# Patient Record
Sex: Male | Born: 1940 | Race: Asian
Health system: Southern US, Academic
[De-identification: ages and names within clinical notes are randomized; demographics above are authoritative.]

## PROBLEM LIST (undated history)

## (undated) ENCOUNTER — Encounter

## (undated) DIAGNOSIS — Z978 Presence of other specified devices: Secondary | ICD-10-CM

## (undated) DIAGNOSIS — I2510 Atherosclerotic heart disease of native coronary artery without angina pectoris: Secondary | ICD-10-CM

## (undated) DIAGNOSIS — K219 Gastro-esophageal reflux disease without esophagitis: Secondary | ICD-10-CM

## (undated) DIAGNOSIS — N319 Neuromuscular dysfunction of bladder, unspecified: Secondary | ICD-10-CM

## (undated) DIAGNOSIS — I495 Sick sinus syndrome: Secondary | ICD-10-CM

## (undated) DIAGNOSIS — N400 Enlarged prostate without lower urinary tract symptoms: Secondary | ICD-10-CM

## (undated) DIAGNOSIS — I10 Essential (primary) hypertension: Secondary | ICD-10-CM

## (undated) DIAGNOSIS — I639 Cerebral infarction, unspecified: Secondary | ICD-10-CM

## (undated) DIAGNOSIS — E559 Vitamin D deficiency, unspecified: Secondary | ICD-10-CM

## (undated) HISTORY — DX: Vitamin D deficiency, unspecified: E55.9

## (undated) HISTORY — PX: CARDIAC SURGERY: SHX584

## (undated) HISTORY — PX: PACEMAKER PLACEMENT: SHX43

## (undated) HISTORY — PX: APPENDECTOMY: SHX54

---

## 2017-06-24 ENCOUNTER — Inpatient Hospital Stay

## 2017-06-24 ENCOUNTER — Emergency Department: Admit: 2017-06-24 | Discharge: 2017-06-25

## 2017-06-24 DIAGNOSIS — Z590 Homelessness: Secondary | ICD-10-CM

## 2017-06-24 DIAGNOSIS — I10 Essential (primary) hypertension: Secondary | ICD-10-CM

## 2017-06-24 DIAGNOSIS — I2510 Atherosclerotic heart disease of native coronary artery without angina pectoris: Secondary | ICD-10-CM

## 2017-06-24 DIAGNOSIS — Z955 Presence of coronary angioplasty implant and graft: Secondary | ICD-10-CM

## 2017-06-24 DIAGNOSIS — I69323 Fluency disorder following cerebral infarction: Secondary | ICD-10-CM

## 2017-06-24 DIAGNOSIS — Z95810 Presence of automatic (implantable) cardiac defibrillator: Secondary | ICD-10-CM

## 2017-06-24 DIAGNOSIS — Z9181 History of falling: Secondary | ICD-10-CM

## 2017-06-24 DIAGNOSIS — R0602 Shortness of breath: Secondary | ICD-10-CM

## 2017-06-24 DIAGNOSIS — F17210 Nicotine dependence, cigarettes, uncomplicated: Secondary | ICD-10-CM

## 2017-06-24 DIAGNOSIS — I69354 Hemiplegia and hemiparesis following cerebral infarction affecting left non-dominant side: Secondary | ICD-10-CM

## 2017-06-24 DIAGNOSIS — Z550 Illiteracy and low-level literacy: Secondary | ICD-10-CM

## 2017-06-24 DIAGNOSIS — Z951 Presence of aortocoronary bypass graft: Secondary | ICD-10-CM

## 2017-06-24 DIAGNOSIS — R079 Chest pain, unspecified: Secondary | ICD-10-CM

## 2017-06-24 DIAGNOSIS — R296 Repeated falls: Secondary | ICD-10-CM

## 2017-06-24 DIAGNOSIS — I639 Cerebral infarction, unspecified: Secondary | ICD-10-CM

## 2017-06-24 MED ORDER — IOHEXOL 350 MG/ML IV SOLN SH
100 mL | Freq: Once | INTRAVENOUS | Status: CP
Start: 2017-06-24 — End: ?

## 2017-06-24 MED ORDER — ASPIRIN 325 MG PO TABS
325 mg | Freq: Once | ORAL | Status: CP
Start: 2017-06-24 — End: ?

## 2017-06-24 MED ORDER — SODIUM CHLORIDE 0.9% FOR FLUSHES
20-180 mL | INTRAVENOUS | Status: CP | PRN
Start: 2017-06-24 — End: ?

## 2017-06-24 MED ORDER — GLUCOSE 4 G PO CHEW JX
16 g | ORAL | Status: DC | PRN
Start: 2017-06-24 — End: 2017-06-26

## 2017-06-24 MED ORDER — ENOXAPARIN SODIUM 40 MG/0.4ML SC SOLN
40 mg | Freq: Every day | SUBCUTANEOUS | Status: DC
Start: 2017-06-24 — End: 2017-06-26

## 2017-06-24 MED ORDER — DEXTROSE 50 % IV SOLN
30 mL | INTRAVENOUS | Status: DC | PRN
Start: 2017-06-24 — End: 2017-06-26

## 2017-06-24 MED ORDER — ASPIRIN 81 MG PO CHEW
81 mg | Freq: Every day | ORAL | Status: DC
Start: 2017-06-24 — End: 2017-06-26

## 2017-06-24 NOTE — ED Notes
Pt stated someone told him that he was going to be d/c and no longer admitted. Paged IM MD about whether or not pt is being d/c or admitted. Tracking #[phone]

## 2017-06-24 NOTE — Medical Student
CC: Chest pain and SOB    HPI: Mr. Antonio Hunter is a 77 y.o. 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  Episode was brief (a few minutes) and improved with rest; no cited aggravating factors.  Patient states this is the first episode of chest pain since placement of his pacemaker.   Episode was associated with shortness of breath but denies radiation of pain to back or arm, palpitations, N/V, sweating, dizziness, lightheadedness, LE swelling, HA, changes in vision, or focal weakness.  Denies recent illness, fever, fatigue.  Denies GI or urinary sx.  Patient reports compliance with 3 medications (unable to name), including dialy aspirin, "cholesterol pill", and "heart pill", "blood pressure pill".  Patient's social situation is significant for homeslessness.  He states he is in frequent contact with his niece [name] (phone # provided verbally by patient: [phone]) who lives in [place].    PMH: ICM (EF 35%), CAD, CVA, HTN.    PSH: Back surgery (unspecified procedure involving 4 vertebrae) in 1971.  Appendectomy in 1987.  CABG (unspecified date). AICD placement 06/17/17.    FHx: Unknown    Social Hx: Current smoker 1/2 pack per day.  No EtOH use.  Denies drug use.  Homeless and unemployed.  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    ROS:  Constitutional: Negative for fever, fatigue, chills.  Eyes: Negative changes in vision.  Respiratory: Negative for wheezing.  Positive for SOB.  Cardiovascular: Negative for orthopnea, claudication, LE edema.  GI: Negative for nausea, vomiting, change in BM, abdominal pain.  GU: Baseline incontinence requiring diaper; good control over bowels.  Neurologic: L-sided UE weakness (baseline deficits from previous stroke).

## 2017-06-24 NOTE — ED Notes
Pt AOX4, RR e/u, NAD. Pt comfortable, bed in lowest position, side rails up X2. Will continue to monitor.

## 2017-06-24 NOTE — ED Provider Notes
Chest pain, unspecified type  SOB (shortness of breath)  CAD in native artery      ED Patient Status   Patient Status:   [phone]}        ED Medical Evaluation Initiated   Medical Evaluation Initiated:  Yes, filed at 06/24/17 0409  by Ella JubileeGabriel, James, MD

## 2017-06-24 NOTE — ED Provider Notes
Temp src Oral   Height 1.854 m   Weight 72.6 kg   SpO2 100 %   BMI (Calculated) 21.15             Physical Exam   Constitutional: He appears well-developed.   HENT:   Head: Normocephalic.   Nose: No rhinorrhea. No epistaxis.   Eyes: Pupils are equal, round, and reactive to light. No scleral icterus.   Neck: No tracheal deviation present.   Cardiovascular: Regular rhythm and normal heart sounds.    No murmur heard.  Pulmonary/Chest: Effort normal. No stridor. He has no rales.   Abdominal: He exhibits no distension and no mass. There is no guarding.   Musculoskeletal: Normal range of motion.   Skin: Skin is warm.   Nursing note and vitals reviewed.      Differential DDx:   ACS/STEMI, Pneumothorax, Pulmonary Embolism, Aortic Dissection, Cocaine CP, Costochondritis. Also,  GERD, Esophagitis, PUD, Gastric Ulcer,  Pancreatitis, Other      Is this an Emergent Medical Condition? Yes - Severe Pain/Acute Onset of Symptons  409.901 FS  641.19 FS  627.732 (16) FS    ED Workup   Procedures    Labs:  - - No data to display      Imaging (Read by ED Provider):  ***      EKG (Read by ED Provider):  ***        ED Course & Re-Evaluation     ED Course as of Jun 24 533   Fri Jun 24, 2017   0441 Tried to provide patient with papers to read/write for comfort 2/2 to difficulty speak. In low tone of voice patient states "I cant read or write"  [JG]   0507 Sent of for records from Antonio Hunter Hunter  [JG]      ED Course User Index  [JG] Antonio Hunter Hunter, James, MD     Obtained Records: Antonio Hunter Hunter Antonio Hunter 29-Sep-1940. HPI noted 06/15/17: "Patient was brought here because he has no where else to go" "He was evicted from an assisted living facility which he was staying at for his left sided deficits from CVA in the 90s. He wqas [facility] and taken to the shelter however was not given placement due to his unsteady gait"     MDM   Decide to obtain history from someone other than the patient: Yes - EMS    Decide to obtain previous medical records: No

## 2017-06-24 NOTE — ED Provider Notes
Per Radiology: Cta Chest Pe Protocol W/o & W/ Iv Con    Result Date: 06/24/2017  CT ANGIOGRAPHY OF THE CHEST (PULMONARY EMBOLUS PROTOCOL) Clinical History: [age] years Male SOB (shortness of breath) Comparison: None Technique: Spiral, multi-slice images were obtained and reconstructed at 2mm increments from the thoracic inlet to the adrenal glands following the administration of intravenous contrast only. Multiplanar and 3-D MIP image reconstructions were performed. FINDINGS: Pulmonary Arteries: There are no filling defects within the pulmonary arterial system to suggest pulmonary embolus. Aorta and branching vessels: Scattered ASVD of the aorta and great vessels is noted without any  flow-limiting stenoses. No aneurysm or dissection of the aortic arch or thoracic aorta is seen. There is scattered atherosclerotic vascular disease throughout the aorta and celiac artery, SMA and bilateral renal arteries. Thyroid: Small nonspecific 3 mm hypodensity within the anterior aspect of the right lower lobe,  otherwise no significant abnormalities. Mediastinum/hilum: The hila are normal in appearance bilaterally.  No mediastinal masses or adenopathy is seen.  The visualized tracheobronchial tree is normal.  The visualized esophagus is normal. Heart: The heart is normal is size.  There is no pericardial effusion seen.  There is diffuse atherosclerotic vascular disease throughout the coronary arteries. Coronary stents are also noted. The RV/LV ratio is normal (less than 0.9). Lungs: The lung fields are clear without consolidations, volume loss, or mass lesions.  There are no pleural effusions, pneumothorax, or hemothorax seen. Upper Abdomen: Limited images of the visualized upper abdomen demonstrate no acute abnormality. Osseous structures: Multilevel bridging vertebral osteophytes. Mild kyphosis of the thoracic spine. No acute osseous abnormality. Median sternotomy wires noted. Soft tissues: There is a left

## 2017-06-24 NOTE — ED Notes
Pt resting w/ eyes closed. Pt RR e/u, NAD.

## 2017-06-24 NOTE — Consults
unremarkable. EKG abnormal with diffuse TWI, no old EKG to compare. Trop I negative. CXR unremarkable. Bedside echo with EF 40%, RV normal size/function, no pericardial effusion.  Patient with non anginal pain at the site of the recently implanted AICD. No signs of infection/hematoma on exam.     Recommendations:  -device RN to check device today  -no further CV workup     Robb MatarKhadeeja Esmail, M.D   Cardiology Fellow PGY 5  Pager [phone]

## 2017-06-24 NOTE — ED Provider Notes
60 ml/min/1.73M2.   HEPATIC FUNCTION PANEL - Abnormal     Albumin 3.9  3.8 - 4.9 g/dL    Total Bilirubin 0.7  0.2 - 1.0 mg/dL    Bilirubin, Direct 0.2  0.0 - 0.2 mg/dL    Bilirubin, Indirect 0.5  8mg /dL mg/dL    Alkaline Phosphatase 176 (*) 40 - 129 IU/L    AST 20  14 - 33 IU/L    ALT 22  10 - 42 IU/L    Total Protein 7.7  6.5 - 8.3 g/dL    ALBUMIN/GLOBULIN RATIO 1.0  (calc)    Calc Total Globuin 3.8  gm/dL   D-DIMER,QUANTITATIVE - Abnormal     D Dimer (hs) 1.98 (*) 0.00 - 0.49 ug/mL (FEU)    Comment: A cut-off for the exclusion of DVT and PE has not been established for this method   CBC AUTODIFF - Abnormal     WBC 5.54  4.5 - 11 x10E3/uL    RBC 4.30 (*) 4.50 - 6.30 x10E6/uL    Hemoglobin 12.0 (*) 14.0 - 18.0 g/dL    Hematocrit 46.937.8 (*) 40.0 - 54.0 %    MCV 87.9  82.0 - 101.0 fl    MCH 27.9  27.0 - 34.0 pg    MCHC 31.7  31.0 - 36.0 g/dL    RDW 62.915.9  52.812.0 - 41.316.1 %    Platelet Count 138 (*) 140 - 440 thou/cu mm    MPV 10.3  9.5 - 11.5 fl    nRBC % 0.0  0.0 - 1.0 %    Absolute NRBC Count 0.00      Neutrophils % 58.7  34.0 - 73.0 %    Lymphocytes % 27.4  25.0 - 45.0 %    Monocytes % 11.6 (*) 2.0 - 6.0 %    Eosinophils % 1.1  1.0 - 4.0 %    Immature Granulocytes % 0.5  0.0 - 2.0 %    Neutrophils Absolute 3.25  1.80 - 8.70 x10E3/uL    Lymphocytes Absolute 1.52  x10E3/uL    Monocytes Absolute 0.64  x10E3/uL    Eosinophils Absolute 0.06  x10E3/uL    Basophil Absolute 0.04  x10E3/uL    Absolute Immature Granulocytes 0.03 (*) 0 - 0 x10E3/uL    Basophils % 0.7  0 - 1 %   LIPASE - Normal    Lipase 24  0 - 60 U/L   MAGNESIUM - Normal    Magnesium 2.2  1.8 - 2.6 mg/dL   PROTIME-INR - Normal    Protime 13.7  11.9 - 14.3 seconds    INR 1.1  0.9 - 1.1   POCT TROPININ I - Normal    Troponin I (Point of Care) <0.05  0.00 - 0.23 ng/mL   POCT URINALYSIS AUTO W/O MICROSCOPY    Color -Ur Amber      Clarity, UA Clear      Spec Grav 1.015  1.003 - 1.030    pH 5.5  4.5 - 8.0    Urobilinogen -Ur 1.0  <=2.0 E.U./dL

## 2017-06-24 NOTE — ED Notes
Called tele for pt to be moved to EIA and to receive one. They reported that it does not pick up where they want to place the pt. All other rooms are currently full.

## 2017-06-24 NOTE — ED Provider Notes
Clinical Lab Test(s): Ordered and Reviewed    Diagnostic Tests (Radiology, EKG): Ordered and Reviewed    Independent Visualization (ED US, Wet Prep, Other): No    Discussed patient with NON-ED Provider: {SH ED Lamonte SakaiJX MDM - ANOTHER PROVIDER:[id number]}      ED Disposition   ED Disposition: No ED Disposition Set      ED Clinical Impression   ED Clinical Impression:   Chest pain, unspecified type  SOB (shortness of breath)  CAD in native artery      ED Patient Status   Patient Status:   {[facility] PATIENT STATUS:[phone]}        ED Medical Evaluation Initiated   Medical Evaluation Initiated:  Yes, filed at 06/24/17 0409  by Ella JubileeGabriel, James, MD

## 2017-06-24 NOTE — Consults
unremarkable. EKG abnormal with diffuse TWI, no old EKG to compare. Trop I negative. CXR unremarkable. Bedside echo with EF 40%, RV normal size/function, no pericardial effusion.  Patient with non anginal pain at the site of the recently implanted AICD. No signs of infection/hematoma on exam.     Recommendations:  -device RN to check device today  -no further CV workup     Robb MatarKhadeeja Esmail, M.D   Cardiology Fellow PGY 5  Pager [phone]       Cardiology Day Team Addendum  Patient was seen and assessed in ED. Similar to Dr. Criselda PeachesEsmail's description, patient reported that his pain is sharp and localized over the AICD incision site but has improved since he presented to ED but and shocks were delivered . TROPI levels continued to be negative on repeat check. Patient stated that [place] and wishes to establish health care when he arrives. If patient gets admitted, you may obtain 2 sets of cardiac enzymes. Pacemaker interrogation is not needed at this time. Cardiology team will sign off at this time. Please do not hesitate to contact us for any further questions or concerns.     Kerin PernaFiras Warda, MD

## 2017-06-24 NOTE — ED Notes
Notified that pt's first name is spelt incorrectly, it is Tommie.

## 2017-06-24 NOTE — Medical Student
Mr. Antonio Hunter is a 77 y.o. 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 Cards consulted.  DD include ACS, PE.    Plan:  1. Chest pain, SOB  -EKG show old changes consistent with chronic ischemia (T wave inversion diffusely); no acute ST elevations  -Head CT show no acute process; patient has baseline dysarthria and L-sided weakness  -D-dimer elevated 1.98, f/up CTA of chest negative for PE  -Troponin <0.05; CK and CKMB WNL  -PT/INR WNL  -BMP showed mild electrolyte abnormalities possibly related to dehydration; Glucose 108; Mg WNL  -ASA 325mg  started  -Lovenox 40mg  Injection  -Cards consulted - performed bedside ECHO showing 40% EF; per recs no further workup needed    2. Possible AMS  -There was concern for altered mental status, but patient is alert and oriented but difficult to understand d/t baseline dysarthria.   -UA negative    Patient is hemodynamically stable and symptoms have resolved per patient, who states he is ready to go.  Patient can likely be discharged pending dispo/placement.    Lavena StanfordGetasha Doobay, MS3

## 2017-06-24 NOTE — ED Provider Notes
Decide to obtain history from someone other than the patient: Yes - EMS    Decide to obtain previous medical records: No    Clinical Lab Test(s): Ordered and Reviewed    Diagnostic Tests (Radiology, EKG): Ordered and Reviewed    Independent Visualization (ED US, Wet Prep, Other): No    Discussed patient with NON-ED Provider: {SH ED Lamonte SakaiJX MDM - ANOTHER PROVIDER:[id number]}      ED Disposition   ED Disposition: No ED Disposition Set      ED Clinical Impression   ED Clinical Impression:   Chest pain, unspecified type  SOB (shortness of breath)  CAD in native artery      ED Patient Status   Patient Status:   {[facility] PATIENT [phone]}        ED Medical Evaluation Initiated   Medical Evaluation Initiated:  Yes, filed at 06/24/17 0409  by Ella JubileeGabriel, James, MD

## 2017-06-24 NOTE — ED Notes
Recieved report from Maralyn SagoSarah, [place]RN

## 2017-06-24 NOTE — Consults
Department of Cardiology  Consult Service    Patient: Antonio Hunter  MRN: [id number]    Admit Date: 06/24/2017 /  LOS: 0 days   PCP: No primary care provider on file.  Cardiologist: none    Reason for consult:chest pain   History of Present Illness :   Pt is a 77 yo AAM h/o ICM(EF 35%) s/p AICD 06/17/17 at OSH, CAD s/p CABG(unknown anatomy) and PCI, CVA, HTN, Homeless who presented to ED for chest pain. Patient reports sharp pain around AICD site which started after implantation, at rest, lasts for a few minutes, no aggravating or alleviating factors, 5/10 severity. No associated symptoms. Pt reports adherence to medications. Pt smokes 1/2 ppd, no ETOH or illegal drugs. No family cardiac history. Patient denies syncope, presyncope, fevers, chills,  dyspnea, palpitations, orthopnea, PND, LE swelling, claudication.      Past Medical History:  Past Medical History:   Diagnosis Date   ? AICD (automatic cardioverter/defibrillator) present 06/17/2017   ? At high risk for falls    ? CAD (coronary artery disease)    ? CVA (cerebral vascular accident) (CMS-HCC code) 1996    residual Left sided weakness and slurred speech   ? Frequent falls    ? Homeless    ? HTN (hypertension)    ? Illiterate        Past Surgical History:  Past Surgical History:   Procedure Laterality Date   ? CORONARY ANGIOPLASTY WITH STENT PLACEMENT     ? CORONARY ARTERY BYPASS GRAFT     ? PACEMAKER     ? STERNOTOMY         Allergies:  No Known Allergies    Social History:   Social History     Social History   ? Marital status: Unknown     Spouse name: N/A   ? Number of children: N/A   ? Years of education: N/A     Social History Main Topics   ? Smoking status: Current Every Day Smoker     Packs/day: 0.50     Types: Cigarettes   ? Smokeless tobacco: Never Used   ? Alcohol use No   ? Drug use: No   ? Sexual activity: Not Asked     Other Topics Concern   ? None     Social History Narrative   ? None       Review Of Systems:

## 2017-06-24 NOTE — ED Provider Notes
Cardiovascular: Regular rhythm and normal heart sounds.    No murmur heard.  Pulmonary/Chest: Effort normal. No stridor. He has no rales.   Abdominal: He exhibits no distension and no mass. There is no guarding.   Musculoskeletal: Normal range of motion.   Skin: Skin is warm.   Nursing note and vitals reviewed.      Differential DDx:   ACS/STEMI, Pneumothorax, Pulmonary Embolism, Aortic Dissection, Cocaine CP, Costochondritis. Also,  GERD, Esophagitis, PUD, Gastric Ulcer,  Pancreatitis, Other      Is this an Emergent Medical Condition? Yes - Severe Pain/Acute Onset of Symptons  409.901 FS  641.19 FS  627.732 (16) FS    ED Workup   Procedures    Labs:  - - No data to display      Imaging (Read by ED Provider):  ***      EKG (Read by ED Provider):  ***        ED Course & Re-Evaluation          MDM   Decide to obtain history from someone other than the patient: Yes - EMS    Decide to obtain previous medical records: No    Clinical Lab Test(s): Ordered and Reviewed    Diagnostic Tests (Radiology, EKG): Ordered and Reviewed    Independent Visualization (ED US, Wet Prep, Other): No    Discussed patient with NON-ED Provider: {SH ED Lamonte SakaiJX MDM - ANOTHER PROVIDER:[id number]}      ED Disposition   ED Disposition: No ED Disposition Set      ED Clinical Impression   ED Clinical Impression:   Chest pain, unspecified type  SOB (shortness of breath)  CAD in native artery      ED Patient Status   Patient Status:   {[facility]:[id number]}        ED Medical Evaluation Initiated   Medical Evaluation Initiated:  Yes, filed at 06/24/17 0409  by Ella JubileeGabriel, James, MD

## 2017-06-24 NOTE — ED Provider Notes
Atypical Left sided localized chest pain over AICD, new placed 06/17/17  1. Admit  2. Trend troponins, serial ekgs.   3. Cards Recs  4. Formal Echo  5. Device interrogation in a.m. Per cards  6. Keep fall risk        MDM   Decide to obtain history from someone other than the patient: Yes - EMS    Decide to obtain previous medical records: No    Clinical Lab Test(s): Ordered and Reviewed    Diagnostic Tests (Radiology, EKG): Ordered and Reviewed    Independent Visualization (ED US, Wet Prep, Other): No    Discussed patient with NON-ED Provider: Admitting Team and consultant      ED Disposition   ED Disposition: Admit      ED Clinical Impression   ED Clinical Impression:   Chest pain, unspecified type  SOB (shortness of breath)  CAD in native artery  Chest pain, unspecified  AICD (automatic cardioverter/defibrillator) present      ED Patient Status   Patient Status:   Guarded        ED Medical Evaluation Initiated   Medical Evaluation Initiated:  Yes, filed at 06/24/17 0409  by Ella JubileeGabriel, James, MD            Sander NephewMiller, Megan, MD  Resident  06/24/17 [phone]

## 2017-06-24 NOTE — ED Provider Notes
Nitrite -Ur Negative  Negative    Protein-UA Negative  Negative mg/dL    Glucose -Ur Negative  Negative mg/dL    Blood, UA Negative  Negative    WBC, UA Negative  Negative    Bilirubin -Ur Negative  Negative    Ketones -UR Negative  Negative   POCT URINALYSIS AUTO W/O MICROSCOPY   POCT TROPININ I   POCT TROPININ I   POCT TROPININ I   CBC AND DIFFERENTIAL         Imaging (Read by ED Provider):  Per Radiology: Ct Head W/o Iv Con    Result Date: 06/24/2017  PROCEDURE:  CT HEAD W/O IV CON CLINICAL INDICATION: [age] years Male Pain TECHNIQUE: Noncontrast CT of the brain was performed. COMPARISON: None FINDINGS: There is no acute intracranial hemorrhage.  There is no midline shift or mass effect.  There are no extra-axial collections.  The ventricles, sulci, and cisterns are unremarkable.  There is no CT evidence for acute territorial infarct. 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 Globes are unremarkable. No acute fracture. Diffusely thickened calvarium. Paranasal sinuses and mastoid air cells are clear.     No acute intracranial hemorrhage, mass effect or territorial infarct.    Per Radiology: Xr Chest Single View    Result Date: 06/24/2017  STUDY:  XR CHEST SINGLE VIEW CLINICAL INDICATION: [age] years Male Chest pain COMPARISON: None FINDINGS: No focal consolidation, pleural effusion or pneumothorax identified.  The cardiac silhouette, mediastinum, and pulmonary vessels are within normal limits. Status post median sternotomy, CABG, and left chest AICD placement.     No acute cardiopulmonary process. Small right lower lung zone nodularity. Recommend dedicated PA and lateral radiograph the chest when patient's condition permits. This could also represent  summation of shadows. I personally reviewed the images and the residents findings and agree with the above. Read By Valentino Nose- Jamie Ledford M.D.  Electronically Verified By Valentino Nose- Jamie Ledford M.D.  Released Date Time - 06/24/2017 5:44 AM  Resident - [place]

## 2017-06-24 NOTE — ED Provider Notes
History     Chief Complaint   Patient presents with   ? Chest Pain     Antonio Hunter is a 77 y.o. male; past medical history: CAD, Stent, Pacer. BIB [facility], Presents with complaints of chest pain, at approximately 3 am, patient is a very poor historian, and relates this 2/2 to history of stroke in 1990s, patient communicates mostly through broken sentences, very heard to understand. Chest pain, left side, no radiation, quality: unable to specify, pain (patient raises right hand and puts up 4 fingers)/10. Onset possibly today, not worsening. Unable to specify inciting event. Relates pain to recent procedure     The history is provided by the patient. No language interpreter was used.   Chest Pain       No Known Allergies    Patient's Medications    No medications on file       Past Medical History:   Diagnosis Date   ? AICD (automatic cardioverter/defibrillator) present 06/17/2017   ? At high risk for falls    ? CAD (coronary artery disease)    ? CVA (cerebral vascular accident) (CMS-HCC code) 1996    residual Left sided weakness and slurred speech   ? Frequent falls    ? Homeless    ? HTN (hypertension)    ? Illiterate        Past Surgical History:   Procedure Laterality Date   ? CORONARY ANGIOPLASTY WITH STENT PLACEMENT     ? CORONARY ARTERY BYPASS GRAFT     ? PACEMAKER     ? STERNOTOMY         No family history on file.    Social History     Social History   ? Marital status: Unknown     Spouse name: N/A   ? Number of children: N/A   ? Years of education: N/A     Social History Main Topics   ? Smoking status: Current Every Day Smoker     Packs/day: 0.50     Types: Cigarettes   ? Smokeless tobacco: Never Used   ? Alcohol use No   ? Drug use: No   ? Sexual activity: Not Asked     Other Topics Concern   ? None     Social History Narrative   ? None       Review of Systems   Constitutional: Negative.    HENT: Negative.    Eyes: Negative.    Respiratory: Negative.    Cardiovascular: Positive for chest pain.

## 2017-06-24 NOTE — Consults
TECHNIQUE: Noncontrast CT of the brain was performed. COMPARISON: None FINDINGS: There is no acute intracranial hemorrhage.  There is no midline shift or mass effect.  There are no extra-axial collections.  The ventricles, sulci, and cisterns are unremarkable.  There is no CT evidence for acute territorial infarct. 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 Globes are unremarkable. No acute fracture. Diffusely thickened calvarium. Paranasal sinuses and mastoid air cells are clear.     No acute intracranial hemorrhage, mass effect or territorial infarct.    Per Radiology: Xr Chest Single View    Result Date: 06/24/2017  STUDY:  XR CHEST SINGLE VIEW CLINICAL INDICATION: 77 years Male Chest pain COMPARISON: None FINDINGS: No focal consolidation, pleural effusion or pneumothorax identified.  The cardiac silhouette, mediastinum, and pulmonary vessels are within normal limits. Status post median sternotomy, CABG, and left chest AICD placement.     No acute cardiopulmonary process. Small right lower lung zone nodularity. Recommend dedicated PA and lateral radiograph the chest when patient's condition permits. This could also represent  summation of shadows. I personally reviewed the images and the residents findings and agree with the above. Read By Valentino Nose- Jamie Ledford M.D.  Electronically Verified By Valentino Nose- Jamie Ledford M.D.  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 Patient reports sharp pain around AICD site which started after implantation, at rest, lasts for a few minutes, no aggravating or alleviating factors, 5/10 severity. No associated symptoms. Pt reports adherence to medications. Pt smokes 1/2 ppd. Vitals wnl. Physical exam

## 2017-06-24 NOTE — ED Provider Notes
Temp src Oral   Height 1.854 m   Weight 72.6 kg   SpO2 100 %   BMI (Calculated) 21.15             Physical Exam   Constitutional: He appears well-developed.   HENT:   Head: Normocephalic.   Nose: No rhinorrhea. No epistaxis.   Eyes: Pupils are equal, round, and reactive to light. No scleral icterus.   Neck: No tracheal deviation present.   Cardiovascular: Regular rhythm and normal heart sounds.    No murmur heard.  Pulmonary/Chest: Effort normal. No stridor. He has no rales.   Abdominal: He exhibits no distension and no mass. There is no guarding.   Musculoskeletal: Normal range of motion.   Skin: Skin is warm.   Nursing note and vitals reviewed.      Differential DDx:   ACS/STEMI, Pneumothorax, Pulmonary Embolism, Aortic Dissection, Cocaine CP, Costochondritis. Also,  GERD, Esophagitis, PUD, Gastric Ulcer,  Pancreatitis, Other      Is this an Emergent Medical Condition? Yes - Severe Pain/Acute Onset of Symptons  409.901 FS  641.19 FS  627.732 (16) FS    ED Workup   Procedures    Labs:  - - No data to display      Imaging (Read by ED Provider):  ***      EKG (Read by ED Provider):  ***        ED Course & Re-Evaluation     ED Course as of Jun 24 533   Fri Jun 24, 2017   0441 Tried to provide patient with papers to read/write for comfort 2/2 to difficulty speak. In low tone of voice patient states "I cant read or write"  [JG]   [facility]  [facility]      ED Course User Index  [JG] Ella JubileeGabriel, James, MD     Obtained Records: [name] May 05, 1941. HPI noted 06/15/17: "Patient was brought here because he has no where else to go" "He was evicted from an assisted living facility which he was staying at for his left sided deficits from CVA in the 90s. He wqas picked up by [facility] and taken to the shelter however was not given placement due to his unsteady gait"       Plan  Admit  Fall risk,  Trend troponins  Formal Echo  Cards Recs      MDM

## 2017-06-24 NOTE — ED Triage Notes
Pt arrived via [facility] R[facility] from home with c/o L chest pain that started just prior to calling 911.  Pt has hx of pacemaker and reports cardiac stent placement x 2 weeks ago [facility].  + sob.  Denies n/v.  To [facility] for evaluation.

## 2017-06-24 NOTE — ED Notes
Report given to RN in CDU.

## 2017-06-24 NOTE — Medical Student
POCT Urinalysis w/o Microscopy auto    Collection Time: 06/24/17  5:33 AM   Result Value Ref Range    Color -Ur Amber     Clarity, UA Clear     Spec Grav 1.015 1.003 - 1.030    pH 5.5 4.5 - 8.0    Urobilinogen -Ur 1.0 <=2.0 E.U./dL    Nitrite -Ur Negative Negative    Protein-UA Negative Negative mg/dL    Glucose -Ur Negative Negative mg/dL    Blood, UA Negative Negative    WBC, UA Negative Negative    Bilirubin -Ur Negative Negative    Ketones -UR Negative Negative   Basic Metabolic Panel    Collection Time: 06/24/17  6:14 AM   Result Value Ref Range    Sodium 132 (L) 135 - 145 mmol/L    Potassium 4.8 (H) 3.3 - 4.6 mmol/L    Chloride 97 (L) 101 - 110 mmol/L    CO2 24 21 - 29 mmol/L    Urea Nitrogen 23 (H) 6 - 22 mg/dL    Creatinine 1.15 0.67 - 1.17 mg/dL    BUN/Creatinine Ratio 20.0 6.0 - 22.0 (calc)    Glucose 108 (H) 71 - 99 mg/dL    Calcium 10.0 8.6 - 10.0 mg/dL    Osmolality Calc 268.7     Anion Gap 11 4 - 16 mmol/L    EGFR >59 mL/min/1.73M2   Hepatic Function Panel    Collection Time: 06/24/17  6:14 AM   Result Value Ref Range    Albumin 3.9 3.8 - 4.9 g/dL    Total Bilirubin 0.7 0.2 - 1.0 mg/dL    Bilirubin, Direct 0.2 0.0 - 0.2 mg/dL    Bilirubin, Indirect 0.5 <0.75m/dL mg/dL    Alkaline Phosphatase 176 (H) 40 - 129 IU/L    AST 20 14 - 33 IU/L    ALT 22 10 - 42 IU/L    Total Protein 7.7 6.5 - 8.3 g/dL    ALBUMIN/GLOBULIN RATIO 1.0 (calc)    Calc Total Globuin 3.8 gm/dL   Lipase    Collection Time: 06/24/17  6:14 AM   Result Value Ref Range    Lipase 24 0 - 60 U/L   Magnesium    Collection Time: 06/24/17  6:14 AM   Result Value Ref Range    Magnesium 2.2 1.8 - 2.6 mg/dL   PROTIME-INR    Collection Time: 06/24/17  6:14 AM   Result Value Ref Range    Protime 13.7 11.9 - 14.3 seconds    INR 1.1 0.9 - 1.1   D-Dimer,Quantitative    Collection Time: 06/24/17  6:14 AM   Result Value Ref Range    D Dimer (hs) 1.98 (H) 0.00 - 0.49 ug/mL (FEU)   CBC with Differential panel result    Collection Time: 06/24/17  6:14 AM

## 2017-06-24 NOTE — ED Provider Notes
Temp src Oral   Height 1.854 m   Weight 72.6 kg   SpO2 100 %   BMI (Calculated) 21.15             Physical Exam   Constitutional: He appears well-developed.   HENT:   Head: Normocephalic.   Nose: No rhinorrhea. No epistaxis.   Eyes: Pupils are equal, round, and reactive to light. No scleral icterus.   Neck: No tracheal deviation present.   Cardiovascular: Regular rhythm and normal heart sounds.    No murmur heard.  Pulmonary/Chest: Effort normal. No stridor. He has no rales.   Abdominal: He exhibits no distension and no mass. There is no guarding.   Musculoskeletal: Normal range of motion.   Skin: Skin is warm.   Nursing note and vitals reviewed.      Differential DDx:   ACS/STEMI, Pneumothorax, Pulmonary Embolism, Aortic Dissection, Cocaine CP, Costochondritis. Also,  GERD, Esophagitis, PUD, Gastric Ulcer,  Pancreatitis, Other      Is this an Emergent Medical Condition? Yes - Severe Pain/Acute Onset of Symptons  409.901 FS  641.19 FS  627.732 (16) FS    ED Workup   Procedures    Labs:  - - No data to display      Imaging (Read by ED Provider):  ***      EKG (Read by ED Provider):  ***        ED Course & Re-Evaluation     ED Course as of Jun 24 533   Fri Jun 24, 2017   0441 Tried to provide patient with papers to read/write for comfort 2/2 to difficulty speak. In low tone of voice patient states "I cant read or write"  [JG]   [facility]7 [facility]  [[facility]]      ED Course User Index  [JG] Ella JubileeGabriel, James, MD         MDM   Decide to obtain history from someone other than the patient: Yes - EMS    Decide to obtain previous medical records: No    Clinical Lab Test(s): Ordered and Reviewed    Diagnostic Tests (Radiology, EKG): Ordered and Reviewed    Independent Visualization (ED US, Wet Prep, Other): No    Discussed patient with NON-ED Provider: {SH ED Lamonte SakaiJX MDM - ANOTHER ZOXWRUEA:[id number]}PROVIDER:[id number]}      ED Disposition   ED Disposition: No ED Disposition Set      ED Clinical Impression   ED Clinical Impression:

## 2017-06-24 NOTE — Consults
unremarkable. EKG abnormal with diffuse TWI, no old EKG to compare. Trop I negative. CXR unremarkable. Bedside echo with EF 40%, RV normal size/function, no pericardial effusion.  Antonio with non anginal Hunter at the site of the recently implanted AICD. No signs of infection/hematoma on exam.     Recommendations:  -device RN to check device today  -no further CV workup     Antonio Hunter, M.D   Cardiology Fellow PGY 5  Pager [phone]       Cardiology Day Team Addendum  Antonio was seen and assessed in ED. Similar to Antonio Hunter's description, Antonio Hunter that Antonio Hunter is sharp and localized over the AICD incision site but has improved since he presented to ED but and shocks were delivered . TROPI levels continued to be negative on repeat check. Antonio [place] and wishes to establish health care when he arrives. If Antonio gets admitted, you may obtain 2 sets of cardiac enzymes. Pacemaker interrogation is not needed at this time. Cardiology team will sign off at this time. Please do not hesitate to contact us for any further questions or concerns.     Kerin PernaFiras Warda, MD        ATTENDING TEACHING STATEMENT: I personally saw and examined the Antonio. I discussed the case with the fellow and the resident. I agree with the above assessment and care for this Antonio. I modified as appropriate the note above.  Antonio GambleFrancesco Franchi, MD

## 2017-06-24 NOTE — ED Notes
Patient arrived in ED.  He is awake, alert.  Denies complaints at this time.  Appears comfortable, no distress noted.

## 2017-06-24 NOTE — ED Provider Notes
History     Chief Complaint   Patient presents with   ? Chest Pain     Antonio Hunter is a 77 y.o. male; past medical history: CAD, Stent, Pacer. BIB [facility], Presents with complaints of chest pain, at approximately 3 am, patient is a very poor historian, and relates this 2/2 to history of stroke in 1990s, patient communicates mostly through broken sentences, very heard to understand. Chest pain, left side, no radiation, quality: unable to specify, pain (patient raises right hand and puts up 4 fingers)/10. Onset possibly today, not worsening. Unable to specify inciting event. Relates pain to recent procedure     The history is provided by the patient. No language interpreter was used.   Chest Pain       No Known Allergies    Patient's Medications    No medications on file       Past Medical History:   Diagnosis Date   ? CAD (coronary artery disease)        Past Surgical History:   Procedure Laterality Date   ? CORONARY ANGIOPLASTY WITH STENT PLACEMENT     ? PACEMAKER         No family history on file.    Social History     Social History   ? Marital status: N/A     Spouse name: N/A   ? Number of children: N/A   ? Years of education: N/A     Social History Main Topics   ? Smoking status: Current Every Day Smoker     Packs/day: 0.50     Types: Cigarettes   ? Smokeless tobacco: Never Used   ? Alcohol use No   ? Drug use: No   ? Sexual activity: Not Asked     Other Topics Concern   ? None     Social History Narrative   ? None       Review of Systems   Constitutional: Negative.    HENT: Negative.    Eyes: Negative.    Respiratory: Negative.    Cardiovascular: Positive for chest pain.   Gastrointestinal: Negative.    Genitourinary: Negative.    Musculoskeletal: Negative.    Skin: Negative.    Neurological: Negative.    Psychiatric/Behavioral: Negative.    Allergic/Immunologic: negative.    Endocrine: negative.       Physical Exam     ED Triage Vitals [06/24/17 0402]   BP 98/60   Pulse 88   Resp 16   Temp 36.6 ?C (97.8 ?F)

## 2017-06-24 NOTE — ED Provider Notes
History     Chief Complaint   Patient presents with   ? Chest Pain     Antonio Hunter is a 77 y.o. male; past medical history: CAD, Stent, Pacer. BIB [facility], Presents with complaints of chest pain, at approximately 3 am.     The history is provided by the patient. No language interpreter was used.   Chest Pain       No Known Allergies    Patient's Medications    No medications on file       Past Medical History:   Diagnosis Date   ? CAD (coronary artery disease)        Past Surgical History:   Procedure Laterality Date   ? CORONARY ANGIOPLASTY WITH STENT PLACEMENT     ? PACEMAKER         No family history on file.    Social History     Social History   ? Marital status: N/A     Spouse name: N/A   ? Number of children: N/A   ? Years of education: N/A     Social History Main Topics   ? Smoking status: Current Every Day Smoker     Packs/day: 0.50     Types: Cigarettes   ? Smokeless tobacco: Never Used   ? Alcohol use No   ? Drug use: No   ? Sexual activity: Not Asked     Other Topics Concern   ? None     Social History Narrative   ? None       Review of Systems   Constitutional: Negative.    HENT: Negative.    Eyes: Negative.    Respiratory: Negative.    Cardiovascular: Positive for chest pain.   Gastrointestinal: Negative.    Genitourinary: Negative.    Musculoskeletal: Negative.    Skin: Negative.    Neurological: Negative.    Psychiatric/Behavioral: Negative.    Allergic/Immunologic: negative.    Endocrine: negative.       Physical Exam     ED Triage Vitals [06/24/17 0402]   BP 98/60   Pulse 88   Resp 16   Temp 36.6 ?C (97.8 ?F)   Temp src Oral   Height 1.854 m   Weight 72.6 kg   SpO2 100 %   BMI (Calculated) 21.15             Physical Exam   Constitutional: He appears well-developed.   HENT:   Head: Normocephalic.   Nose: No rhinorrhea. No epistaxis.   Eyes: Pupils are equal, round, and reactive to light. No scleral icterus.   Neck: No tracheal deviation present.

## 2017-06-24 NOTE — ED Notes
Pt AOX4, RR e/u, NAD. Pt denies any pain, NV. Pt asking when he will be discharged. Pt denies any chest pain or SOB. VSS. Bed in lowest position, side rails up X2, will continue to monitor.

## 2017-06-24 NOTE — ED Provider Notes
Gastrointestinal: Negative.    Genitourinary: Negative.    Musculoskeletal: Negative.    Skin: Negative.    Neurological: Negative.    Psychiatric/Behavioral: Negative.    Allergic/Immunologic: negative.    Endocrine: negative.       Physical Exam     ED Triage Vitals [06/24/17 0402]   BP 98/60   Pulse 88   Resp 16   Temp 36.6 ?C (97.8 ?F)   Temp src Oral   Height 1.854 m   Weight 72.6 kg   SpO2 100 %   BMI (Calculated) 21.15             Physical Exam   Constitutional: He appears well-developed.   HENT:   Head: Normocephalic.   Nose: No rhinorrhea. No epistaxis.   Eyes: Pupils are equal, round, and reactive to light. No scleral icterus.   Neck: No tracheal deviation present.   Cardiovascular: Regular rhythm and normal heart sounds.    No murmur heard.  Pulmonary/Chest: Effort normal. No stridor. He has no rales.   Abdominal: He exhibits no distension and no mass. There is no guarding.   Musculoskeletal: Normal range of motion.   Skin: Skin is warm.   Nursing note and vitals reviewed.      Differential DDx:   ACS/STEMI, Pneumothorax, Pulmonary Embolism, Aortic Dissection, Cocaine CP, Costochondritis. Also,  GERD, Esophagitis, PUD, Gastric Ulcer,  Pancreatitis, Other      Is this an Emergent Medical Condition? Yes - Severe Pain/Acute Onset of Symptons  409.901 FS  641.19 FS  627.732 (16) FS    ED Workup   Procedures    Labs:  -   BASIC METABOLIC PANEL - Abnormal        Result Value Ref Range    Sodium 132 (*) 135 - 145 mmol/L    Potassium 4.8 (*) 3.3 - 4.6 mmol/L    Chloride 97 (*) 101 - 110 mmol/L    CO2 24  21 - 29 mmol/L    Urea Nitrogen 23 (*) 6 - 22 mg/dL    Creatinine 1.15  0.67 - 1.17 mg/dL    BUN/Creatinine Ratio 20.0  6.0 - 22.0 (calc)    Glucose 108 (*) 71 - 99 mg/dL    Calcium 10.0  8.6 - 10.0 mg/dL    Osmolality Calc 268.7      Anion Gap 11  4 - 16 mmol/L    EGFR >59  mL/min/1.73M2    Comment:   Reference range: =>90 ml/min/1.73M2  eGFR estimates are unable to accurately differentiate levels of GFR above

## 2017-06-24 NOTE — ED Provider Notes
Gastrointestinal: Negative.    Genitourinary: Negative.    Musculoskeletal: Negative.    Skin: Negative.    Neurological: Negative.    Psychiatric/Behavioral: Negative.    Allergic/Immunologic: negative.    Endocrine: negative.       Physical Exam     ED Triage Vitals [06/24/17 0402]   BP 98/60   Pulse 88   Resp 16   Temp 36.6 ?C (97.8 ?F)   Temp src Oral   Height 1.854 m   Weight 72.6 kg   SpO2 100 %   BMI (Calculated) 21.15             Physical Exam   Constitutional: He appears well-developed.   HENT:   Head: Normocephalic.   Nose: No rhinorrhea. No epistaxis.   Eyes: Pupils are equal, round, and reactive to light. No scleral icterus.   Neck: No tracheal deviation present.   Cardiovascular: Regular rhythm and normal heart sounds.    No murmur heard.  Pulmonary/Chest: Effort normal. No stridor. He has no rales.   Abdominal: He exhibits no distension and no mass. There is no guarding.   Musculoskeletal: Normal range of motion.   Skin: Skin is warm.   Nursing note and vitals reviewed.      Differential DDx:   ACS/STEMI, Pneumothorax, Pulmonary Embolism, Aortic Dissection, Cocaine CP, Costochondritis. Also,  GERD, Esophagitis, PUD, Gastric Ulcer,  Pancreatitis, Other      Is this an Emergent Medical Condition? Yes - Severe Pain/Acute Onset of Symptons  409.901 FS  641.19 FS  627.732 (16) FS    ED Workup   Procedures    Labs:  -   BASIC METABOLIC PANEL   HEPATIC FUNCTION PANEL   LIPASE   MAGNESIUM   PROTIME-INR   D-DIMER,QUANTITATIVE   CBC AUTODIFF   POCT URINALYSIS AUTO W/O MICROSCOPY       Result Value Ref Range    Color -Ur Amber      Clarity, UA Clear      Spec Grav 1.015  1.003 - 1.030    pH 5.5  4.5 - 8.0    Urobilinogen -Ur 1.0  <=2.0 E.U./dL    Nitrite -Ur Negative  Negative    Protein-UA Negative  Negative mg/dL    Glucose -Ur Negative  Negative mg/dL    Blood, UA Negative  Negative    WBC, UA Negative  Negative    Bilirubin -Ur Negative  Negative    Ketones -UR Negative  Negative

## 2017-06-24 NOTE — ED Provider Notes
upper chest cardiac pacemaker with scattered punctate foci of gas  seen surrounding the pacemaker. Limited evaluation of the surrounding soft tissues due to streak artifact by pacemaker. No evidence of organized fluid collection.     No evidence of pulmonary embolus. Scattered atherosclerotic vascular disease of the aorta and branching vessels. Punctate foci of gas surrounding the pacemaker. No evidence of organized fluid collection, however evaluation is limited due to streak artifact by pacemaker. Evaluation of left upper chest soft tissues is recommended.        EKG (Read by ED Provider):  NSR T wave inversion/depression II, III, AVF, V3-V6, no previous  NSR T wave inversion/depression II, III, AVF, V3-V6, similar to previous  NSR T wave inversion/depression II, III, AVF, V3-V6, similar to previous      ED Course & Re-Evaluation     ED Course as of Jun 25 911   Fri Jun 24, 2017   0441 Tried to provide patient with papers to read/write for comfort 2/2 to difficulty speak. In low tone of voice patient states "I cant read or write"  [JG]   0507 [facility]  [JG]   0836 No redness or erythema appreciated. Pt does endorse tenderness will palpation. Sander NephewMegan Miller, MD 8:37 AM 06/24/2017    [MM]   [phone] D/w internal medicine who agree to admit pt for pacemaker interrigation, medication eval and placement. Sander NephewMegan Miller, MD 9:10 AM 06/24/2017    [MM]      ED Course User Index  [JG] Ella JubileeGabriel, James, MD  [MM] Sander NephewMiller, Megan, MD     [facility] Feb 28, 1941. HPI noted 06/15/17: "Patient was brought here because he has no where else to go" "He was evicted from an assisted living facility which he was staying at for his left sided deficits from CVA in the 90s. He [facility]as picked up by [facility] and taken to the shelter however was not given placement due to his unsteady gait"     Bedside echo, no effusion, good squeeze, no effusion, no b-lines.   Plan

## 2017-06-24 NOTE — Consults
All systems reviewed and negative except as in HPI       Outpatient Medications:  Prior to Admission medications    Not on File       Inpatient Medications:  Scheduled:    PRN:    IV infusions:         Physical Exam:     Body mass index is 21.11 kg/m?Marland Kitchen.  Blood pressure 132/62, pulse 65, temperature 36.6 ?C (97.8 ?F), temperature source Oral, resp. rate 16, height 1.854 m (6\' 1" ), weight 72.6 kg (160 lb), SpO2 100 %.   GEN: NAD. Resting comfortably in bed   HEENT: NCAT, EOMI, No erythema or exudate in oral cavity. Trachea midline  HEART: regular rhythm, rate wnl, S1 and S2 normal, No Murmurs/gallops/rubs. No JVD. AICD incision site tender to palpation, no drainage, no hematoma.   LUNGS: Clear to auscultation bilaterally. No RRW.   ABD: Positive bowel sounds, soft, nontender, nondistended  EXT: warm, no edema, 2+ pulses equal and symmetric bilaterally  NEURO: AAOx3      Data Review:   No results for input(s): CKMB, TROPONINT, BNP in the last 72 hours.    Invalid input(s): CARDAICCK, CKINDEX  Recent Labs      06/24/17   0614   WBC  5.54   HGB  12.0*   HCT  37.8*     No results for input(s): NA, K, CL, CO2, BUN, CREATININE, GLUCOSE in the last 72 hours.  No results for input(s): AST, ALT, ALKPHOS in the last 72 hours.    Invalid input(s): LABALBU, LASTTBILI  Labs:  Coagulation:   Lab Results   Component Value Date    PROTIME 13.7 06/24/2017    INR 1.1 06/24/2017   , Lipid Profile  No results found for: CHOL, TRIG, HDL, Magnesium:  No results found for: MG, Phosphorus No results found for: PHOSPHORUS and Thyroid Studies  No results found for: TSH, FREET4, T3FREE    Ht Readings from Last 1 Encounters:   06/24/17 1.854 m (6\' 1" )      Wt Readings from Last 1 Encounters:   06/24/17 72.6 kg (160 lb)    Body mass index is 21.11 kg/m?Marland Kitchen.  Weight change:     Intake and Output:       Radiology:  Per Radiology: Ct Head W/o Iv Con    Result Date: 06/24/2017  PROCEDURE:  CT HEAD W/O IV CON CLINICAL INDICATION: [age] years Male Pain

## 2017-06-24 NOTE — ED Provider Notes
POCT URINALYSIS AUTO W/O MICROSCOPY   POCT TROPININ I   CBC AND DIFFERENTIAL         Imaging (Read by ED Provider):  ***      EKG (Read by ED Provider):  ***        ED Course & Re-Evaluation     ED Course as of Jun 24 604   Fri Jun 24, 2017   0441 Tried to provide patient with papers to read/write for comfort 2/2 to difficulty speak. In low tone of voice patient states "I cant read or write"  [JG]   0507 Sent of [facility]  [JG]      ED Course User Index  [JG] Antonio JubileeGabriel, James, MD     [name] Nov 05, 1940. HPI noted 06/15/17: "Patient was brought here because he has no where else to go" "He was evicted from an assisted living facility which he was staying at for his left sided deficits from CVA in the 90s. He wqas picked [facility] by [facility] and taken to the shelter however was not given placement due to his unsteady gait"     Bedside echo, no effusion, good squeeze, no effusion, no b-lines.   Plan  Atypical Left sided localized chest pain over AICD, new placed 06/17/17  1. Admit  2. Trend troponins, serial ekgs.   3. Cards Recs  4. Formal Echo  5. Device interrogation in a.m. Per cards  6. Keep fall risk        MDM   Decide to obtain history from someone other than the patient: Yes - EMS    Decide to obtain previous medical records: No    Clinical Lab Test(s): Ordered and Reviewed    Diagnostic Tests (Radiology, EKG): Ordered and Reviewed    Independent Visualization (ED US, Wet Prep, Other): No    Discussed patient with NON-ED Provider: {SH ED Lamonte SakaiJX MDM - ANOTHER PROVIDER:[id number]}      ED Disposition   ED Disposition: No ED Disposition Set      ED Clinical Impression   ED Clinical Impression:   Chest pain, unspecified type  SOB (shortness of breath)  CAD in native artery      ED Patient Status   Patient Status:   {SH [facility] PAT[phone]}        ED Medical Evaluation Initiated   Medical Evaluation Initiated:  Yes, filed at 06/24/17 0409  by Antonio JubileeGabriel, James, MD

## 2017-06-24 NOTE — Medical Student
Negative for headache, focal weakness.    Vitals: T 36.6, HR 69, RR 15, BP 130/76, SpO2 100% RA    Physical exam:  General: Patient is awake, A&Ox3.  Patient is a decent historian.  HEENT: Normocephalic, autraumatic.  PERRLA. EOMI.  Respiratory:  Normal rate and effort.  Lungs clear to auscultation.  Cardiovascular: Normal rate, regular rhythm.  Normal S1 and S2.  No murmurs, rubs, or gallops appreciated. 2+ bilateral radial and carotid pules. 1+ bilateral DP and PT pulses.  No LE edema.  Chest wall soreness/tenderness over L pectoral area at AICD placement site, with keloid-like scar at surgical site   GI:  Normal active bowel sounds.  Abdomen soft, nondistended, nontender.  MSK/Neurologic: CN II-XII grossly intact.  Sensation grossly intact in extremities.  5/5 L UE strength 4/5 R UE strength.  5/5 LE strength bilaterally.  Dermatologic: Skin is dry and warm.    Recent Results (from the past 24 hour(s))   ECG - Tracing (autopage)    Collection Time: 06/24/17  4:18 AM   Result Value Ref Range    Ventricular Rate 74 BPM    Atrial Rate 74 BPM    P-R Interval 146 ms    QRS Duration 122 ms    Q-T Interval 412 ms    QTC Calculation (Bezet) 457 ms    Calculated P Axis 77 degrees    Calculated R Axis -13 degrees    Calculated T Axis -104 degrees   ECG - Adult (autopage)    Collection Time: 06/24/17  4:18 AM   Result Value Ref Range    Ventricular Rate 74 BPM    Atrial Rate 74 BPM    P-R Interval 146 ms    QRS Duration 122 ms    Q-T Interval 412 ms    QTC Calculation (Bezet) 457 ms    Calculated P Axis 77 degrees    Calculated R Axis -13 degrees    Calculated T Axis -104 degrees   ECG - REPEAT (autopage)    Collection Time: 06/24/17  4:50 AM   Result Value Ref Range    Ventricular Rate 71 BPM    Atrial Rate 71 BPM    P-R Interval 142 ms    QRS Duration 114 ms    Q-T Interval 436 ms    QTC Calculation (Bezet) 473 ms    Calculated P Axis 66 degrees    Calculated R Axis -16 degrees    Calculated T Axis -103 degrees

## 2017-06-24 NOTE — Medical Student
Result Value Ref Range    WBC 5.54 4.5 - 11 x10E3/uL    RBC 4.30 (L) 4.50 - 6.30 x10E6/uL    Hemoglobin 12.0 (L) 14.0 - 18.0 g/dL    Hematocrit 16.137.8 (L) 40.0 - 54.0 %    MCV 87.9 82.0 - 101.0 fl    MCH 27.9 27.0 - 34.0 pg    MCHC 31.7 31.0 - 36.0 g/dL    RDW 09.615.9 04.512.0 - 40.916.1 %    Platelet Count 138 (L) 140 - 440 thou/cu mm    MPV 10.3 9.5 - 11.5 fl    nRBC % 0.0 0.0 - 1.0 %    Absolute NRBC Count 0.00     Neutrophils % 58.7 34.0 - 73.0 %    Lymphocytes % 27.4 25.0 - 45.0 %    Monocytes % 11.6 (H) 2.0 - 6.0 %    Eosinophils % 1.1 1.0 - 4.0 %    Immature Granulocytes % 0.5 0.0 - 2.0 %    Neutrophils Absolute 3.25 1.80 - 8.70 x10E3/uL    Lymphocytes Absolute 1.52 x10E3/uL    Monocytes Absolute 0.64 x10E3/uL    Eosinophils Absolute 0.06 x10E3/uL    Basophil Absolute 0.04 x10E3/uL    Absolute Immature Granulocytes 0.03 (H) 0 - 0 x10E3/uL    Basophils % 0.7 0 - 1 %   POCT TROPININ I    Collection Time: 06/24/17  6:31 AM   Result Value Ref Range    Troponin I (Point of Care) <0.05 0.00 - 0.23 ng/mL   POCT Urinalysis w/o Microscopy auto    Collection Time: 06/24/17  9:14 AM   Result Value Ref Range    Color -Ur Amber     Clarity, UA Clear     Spec Grav 1.020 1.003 - 1.030    pH 5.0 4.5 - 8.0    Urobilinogen -Ur 0.2 <=2.0 E.U./dL    Nitrite -Ur Negative Negative    Protein-UA Negative Negative mg/dL    Glucose -Ur Negative Negative mg/dL    Blood, UA Negative Negative    WBC, UA Negative Negative    Bilirubin -Ur Negative Negative    Ketones -UR Negative Negative   POCT TROPININ I    Collection Time: 06/24/17  9:36 AM   Result Value Ref Range    Troponin I (Point of Care) <0.05 0.00 - 0.23 ng/mL   Troponin T    Collection Time: 06/24/17  2:34 PM   Result Value Ref Range    Troponin T <0.01 0.00 - 0.04 ng/ml   CK    Collection Time: 06/24/17  2:34 PM   Result Value Ref Range    Total CK 189 22 - 195 U/L   CKMB    Collection Time: 06/24/17  2:34 PM   Result Value Ref Range    CK-MB 2.6 <=5.0 ng/mL       Assessment:

## 2017-06-24 NOTE — ED Notes
Pt en route to CT. NAD AOX4, RR e/u.

## 2017-06-25 MED ORDER — CEFUROXIME AXETIL 250 MG PO TABS
250 mg | Freq: Two times a day (BID) | ORAL | Status: DC
Start: 2017-06-25 — End: 2017-06-26

## 2017-06-25 MED ORDER — ASPIRIN 81 MG PO CHEW
81 mg | Freq: Every day | ORAL | 1 refills | Status: CP
Start: 2017-06-25 — End: ?

## 2017-06-25 MED ORDER — SPIRONOLACTONE 25 MG PO TABS
25 mg | Freq: Every day | ORAL | 1 refills | Status: CP
Start: 2017-06-25 — End: ?

## 2017-06-25 MED ORDER — ATORVASTATIN CALCIUM 40 MG PO TABS
40 mg | Freq: Every evening | ORAL | Status: DC
Start: 2017-06-25 — End: 2017-06-26

## 2017-06-25 MED ORDER — SPIRONOLACTONE 25 MG PO TABS
25 mg | Freq: Every day | ORAL | Status: DC
Start: 2017-06-25 — End: 2017-06-26

## 2017-06-25 MED ORDER — AMLODIPINE BESYLATE 5 MG PO TABS
5 mg | Freq: Every day | ORAL | Status: DC
Start: 2017-06-25 — End: 2017-06-26

## 2017-06-25 MED ORDER — AMLODIPINE BESYLATE 5 MG PO TABS
5 mg | Freq: Every day | ORAL | 1 refills | Status: CP
Start: 2017-06-25 — End: ?

## 2017-06-25 MED ORDER — TAMSULOSIN HCL 0.4 MG PO CAPS
0.4 mg | Freq: Every day | ORAL | Status: DC
Start: 2017-06-25 — End: 2017-06-26

## 2017-06-25 MED ORDER — CEFUROXIME AXETIL 250 MG PO TABS
250 mg | Freq: Two times a day (BID) | ORAL | 0 refills | Status: CP
Start: 2017-06-25 — End: ?

## 2017-06-25 MED ORDER — LISINOPRIL 20 MG PO TABS
20 mg | Freq: Every day | ORAL | 1 refills | Status: CP
Start: 2017-06-25 — End: ?

## 2017-06-25 MED ORDER — CARVEDILOL 3.125 MG PO TABS
3.125 mg | Freq: Two times a day (BID) | ORAL | Status: DC
Start: 2017-06-25 — End: 2017-06-26

## 2017-06-25 MED ORDER — LISINOPRIL 20 MG PO TABS
20 mg | Freq: Every day | ORAL | Status: DC
Start: 2017-06-25 — End: 2017-06-26

## 2017-06-25 MED ORDER — ATORVASTATIN CALCIUM 40 MG PO TABS
40 mg | Freq: Every evening | ORAL | 1 refills | Status: CP
Start: 2017-06-25 — End: ?

## 2017-06-25 MED ORDER — TAMSULOSIN HCL 0.4 MG PO CAPS
0.4 mg | Freq: Every day | ORAL | 1 refills | Status: CP
Start: 2017-06-25 — End: ?

## 2017-06-25 MED ORDER — CARVEDILOL 3.125 MG PO TABS
3.125 mg | Freq: Two times a day (BID) | ORAL | 1 refills | Status: CP
Start: 2017-06-25 — End: ?

## 2017-06-25 NOTE — Consults
Occupational Therapy Evaluation and Discharge      Start Time (min): 16100928  End Time (min): 0943  Total Time (min): 15  Room/Bed: 620/620-01    Occupational Profile: Antonio Hunter is a 77 y.o. 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      ICD-10-CM ICD-9-CM   1. SOB (shortness of breath) R06.02 786.05   2. Chest pain, unspecified type R07.9 786.50   3. CAD in native artery I25.10 414.01   4. Chest pain, unspecified R07.9 786.50   5. AICD (automatic cardioverter/defibrillator) present Z95.810 V45.02       Precautions:  ? FALL  ? Pacemaker (1 week s/p implant)    Extremity Precautions:  ? None indicated    Orthotic, Protective, & Supportive Devices:  ? None    PLOF: Pt reports currently homeless, little to no social support and (A). Pt reports using a RW for functional mobility. Pt reports mod (I) with all ADLs/IADLs. Pt reports 1 fall in past month, however discrepancy with what pt told PT.    Subjective: RN/pt consent to OT eval. Pt pleasant and cooperative.    **Was an interpreter used: NA    Pain: 0/10    Vitals: Vitals stable    Observations: Pt presented seated on toilet, +RW, all needs in reach.  Vision: WFL  Cognition: WFL  Coordination: WFL  Sensation: WFL  UE ROM/Strength: WFL with LUE AROM to ~90 deg shoulder FF/abd adhering to pacemaker precautions.    Bed Mobility: Supine to/from Sit: Modified Independence (Mod I)  Functional Transfers: Sit <> Stand: Modified Independent  Functional Mobility: Mod (I) for functional mobility at room distances using RW.    Balance:   ? Sitting: Static  Independent (I), Dynamic  Modified Independent (Mod I)  ? Standing: Static  Modified Independent (Mod I), Dynamic  Stand by Assist (SBA)    ADLs: Pt mod (I) with most self care tasks including toileting, grooming,

## 2017-06-25 NOTE — Consults
?   Discharge Disposition: Assisted Living Facility (ALF), vs in house  Shelter     ? DME Recommendations:  1. Already has necessary DMEs    **Note: Discharge recommendations may change based on patient progress. Please refer to the most updated progress note for current discharge recommendations.     Plan of Care: Patient will be seen 3-5 times per week for gait training, therapeutic activities, therapeutic exercise, balance, activity tolerance, patient/caregiver education and training and safety.    PT Evaluation Complexity   History Examination Clinical Presentation Decision Making   moderate (1-2 personal factors/comorbidities) low (addressing 1-2 elements) low (stable) low (using standardized patient assessment instrument and/or measureable assessment of functional outcome)   PT Evaluation Complexity Charge: low     **Co-Treatment: NA  _________________________   Antonio Hunter, PT   06/25/2017

## 2017-06-25 NOTE — ED Notes
Heads up called to 6N.

## 2017-06-25 NOTE — Consults
Please see full H&P to follow.     Nancy Davison, DO   Internal Medicine PGY 2  Pager [phone]

## 2017-06-25 NOTE — Consults
?   Discharge Disposition: Assisted Living Facility (ALF), vs in house  Shelter     ? DME Recommendations:  1. Already has necessary DMEs    **Note: Discharge recommendations may change based on patient progress. Please refer to the most updated progress note for current discharge recommendations.     Plan of Care: Patient will be seen 3-5 times per week for gait training, therapeutic activities, therapeutic exercise, balance, activity tolerance, patient/caregiver education and training, neuromuscular re-education and safety.    PT Evaluation Complexity   History Examination Clinical Presentation Decision Making   moderate (1-2 personal factors/comorbidities) low (addressing 1-2 elements) low (stable) low (using standardized patient assessment instrument and/or measureable assessment of functional outcome)   PT Evaluation Complexity Charge: low     **Co-Treatment: NA  _________________________   Terrilee Filesoncepcion S Ariaga, PT   06/25/2017

## 2017-06-25 NOTE — Consults
UB/LB dressing using RW. Pt with difficulty retrieving items from floor level 2/2 impaired balance.     Patient/Caregiver Education: Pt educated on role of OT, DC planning, fall prevention strategies, energy conservation strategies. Pt verbalized understanding.    Outcome Measures:  ? AM-PAC "6-clicks" Short Form: Raw Score: 23. AM-PAC Score: 51.12. CMS Score: 16.55% - CI    Treatment on Evaluation   ? None     Post Treatment:   Patient Position/safety: Sitting edge of bed, Call Bell within reach, Tray table within reach, All lines and leads intact, Handoff to nurse on patient position    Assessment: Patient is at baseline with all ADLs, mobility, and transfers. Patient is at baseline with UE ROM/strength, cognition, coordination, sensation, and vision. Pt with poor social situation, would benefit from ALF placement OR in house shelter. No skilled acute OT services indicated at this time.    Discharge Recommendations:   ? Discharge Disposition: Assisted Living Facility (ALF) vs in house shelter    ? DME Recommendations:  1. Already has necessary DMEs    Plan: D/C OT    OT Evaluation Complexity   History Examination Decision Making   moderate (expanded review of history including physical, cognitive, and psychosocial performance) low (addressing 1-3 performance deficits) low (limited amount of treatment options, no assessment modification, no co-morbidities)   OT Evaluation Complexity Charge: low     **Co-Treatment: NA  ____________________________   Samara Deistindy Jackson, OT   06/25/2017

## 2017-06-25 NOTE — Plan of Care
Problem: Outcome Measure Goals  Goal: Increase AMPAC score  Patient will demonstrate improvement in balance and functional mobility as indicated by an increase in AMPAC score to 19/20 in order to decrease risk for falls.  Outcome: Ongoing      Problem: Functional Mobility  Goal: Patient will demonstrate sit to stand  Patient will demonstrate sit to stand with Rolling walker (RW), Modified Independence (Mod I) and no cues in order to prepare for out of bed mobility.  Outcome: Ongoing      Problem: Locomotion  Goal: Patient will ambulate  Patient will ambulate 100 feet x 2  with Rolling walker (RW), Modified Independence (Mod I) and no cues in order to prepare for community re-entry.  Outcome: Ongoing      Problem: Balance  Goal: Patient will maintain dynamic standing balance within/outside base of support  Patient will maintain dynamic standing balance outside base of support during functional tasks with Modified Independence (Mod I), No assistive device and no cues in order to prepare for gait.  Outcome: Ongoing

## 2017-06-25 NOTE — Discharge Instr - AVS First Page
Occupational Therapy Discharge Instructions    Please remove clutter from the pathways of your home to reduce your risk for falls. Please have family/friends assist you with performing household chores and cooking tasks. Please take frequent rest breaks throughout your day to conserve your energy.   Your Occupational Therapist was Cindy, OT who can be reached at [phone].

## 2017-06-25 NOTE — Progress Notes
Skin assessed. Skin dry, intact. AICD left chest noted.

## 2017-06-25 NOTE — Consults
Observation: pt sitting on EOB  with RW in his room      Vitals: Vitals stable    Cognition:   ? Alert and Oriented to Person, Place and Situation       ? Command Following: Follows one-step simple commands    Right Upper Extremity:  ? Not Tested    Left Upper Extremity:  ? Not Tested    Right Lower Extremity:  ? Within Functional Limits [facility](WFL) for AROM/PROM and strength    Left Lower Extremity:  ? Within Functional Limits [facility](WFL) for AROM/PROM. Strength:  hip 3-/5 , knee 3-/5 ankle 3-/5     Neurological Examination:  ? Sensation: Intact  ? Proprioception: Not Tested  ? Coordination: impaired on LLE due to weakness possible residual weakness from CVA   ? Motor Control: Within Functional Limits  ? Tone: WFL  ? Reflexes: Not Tested    Balance:   ? Sitting: Static  Independent (I), Dynamic  Independent (I)  ? Standing: Static  Stand by Assist (SBA), Dynamic  Stand by Assist (SBA)    Functional Activities:  Bed Mobility:  ? Supine to Sit: Modified Independent (Mod I) with head of bed (HOB) elevated  Transfers:  ? Sit to/from Stand: Stand by Assist (SBA) with Rolling Walker  Locomotion:  ? GAIT: Patient ambulated 15 ft with Rolling Walker, gait belt, Stand by Assist (SBA). Gait Quality: decrease clearance on L foot with narrow BOS and forward flex posture but no LOB but fatigue easily     Outcome Measures:  [place] AM-PAC Basic Mobility Inpatient Short Form   (Without Stair Climbing):  HOW MUCH DIFFICULTY DOES THE PATIENT CURRENTLY HAVE? SCORE   1. Turning over in bed (including adjusting bedclothes, sheets and blankets)?  2. Moving from lying on back to sitting on the side of the bed?  3. Sitting down on and standing up from a chair with arms? 4  4  3    HOW MUCH HELP FROM ANOTHER PERSON DOES THE PATIENT CURRENTLY NEED?    4. Moving to and from a bed to a chair (including a wheelchair)?  5. Need help to walk in hospital room? 3  3   Scoring: 1 = total (dependent)/unable; 2 = a lot (max/mod assist); 3 = a

## 2017-06-25 NOTE — Progress Notes
CM contacted by MD regarding discharge planning. CM met with pt at bedside to discuss. Pt traveling via [employer] to [place], [facility] (CM verified ticket). Pt came to hospital during layover due to chest pain. D.c plpan is to go to [employer] station and continue trip to [place].     CM arranged transport via [employer] [place] [address].

## 2017-06-25 NOTE — Consults
Physical Therapy Evaluation      Start Time (min): 825  End Time (min): 904  Total Time (min): 39  Room/Bed: 620/620-01    History of Present Illness: Nolen Muommy Ohlsen is a 77 y.o. male admitted on 06/24/2017  PMH of ICM (EF 35%) s/p AICD 06/17/17 at OSH [facility]([place]), CAD s/p CABG (unknown anatomy) and PCI, CVA w/ L sided and speech deficits), HTN that presented to the ED for ACS r/o and placement.      ICD-10-CM ICD-9-CM   1. SOB (shortness of breath) R06.02 786.05   2. Chest pain, unspecified type R07.9 786.50   3. CAD in native artery I25.10 414.01   4. Chest pain, unspecified R07.9 786.50   5. AICD (automatic cardioverter/defibrillator) present Z95.810 V45.02     Past Medical History:   Diagnosis Date   ? AICD (automatic cardioverter/defibrillator) present 06/17/2017   ? At high risk for falls    ? CAD (coronary artery disease)    ? CVA (cerebral vascular accident) (CMS-HCC code) 1996    residual Left sided weakness and slurred speech   ? Frequent falls    ? Homeless    ? HTN (hypertension)    ? Illiterate      Past Surgical History:   Procedure Laterality Date   ? CORONARY ANGIOPLASTY WITH STENT PLACEMENT     ? CORONARY ARTERY BYPASS GRAFT     ? PACEMAKER     ? STERNOTOMY         Precautions:  ? FALL  ? Pacemaker  S/p AICD 06[place]17/[place]     Extremity Precautions:  ? None indicated    Orthotic, Protective, & Supportive Devices:  ? None    Living Environment/Function:  ? Lives: alone  Home assistance: None  ? Lives in: homeless  ? Stair/steps to enter: 0  ? Home DME: rolling walker  ? Prior Level of Function: was independent with self-care, transfers, ambulation, household tasks and/or recreational activities    Subjective: " [place] but if I can find a place to stay here I will stay here I am on a budget but I can pay "   ? Pain Pre-Treatment: No, 0/10  ? Pain Post-Treatment: No, 0/10    **Was an interpreter used: NA    Examination:

## 2017-06-25 NOTE — Progress Notes
Department of Medicine  Internal Medicine Team B  History and Physical  Patient: Antonio Hunter  MRN: [id number]  DOB: 01/17/1941      Primary Care Physician: No primary care provider on file.  Chief Complaint: Chest pain     Subjective      No complaints today. Does not know what his medications are. States he was previously in [place] for device placement but does not know name of hospital. Is wearing an armband that says [facility]" on it.     PMH:   Past Medical History:   Diagnosis Date   ? AICD (automatic cardioverter/defibrillator) present 06/17/2017   ? At high risk for falls    ? CAD (coronary artery disease)    ? CVA (cerebral vascular accident) (CMS-HCC code) 1996    residual Left sided weakness and slurred speech   ? Frequent falls    ? Homeless    ? HTN (hypertension)    ? Illiterate        PSH:  Past Surgical History:   Procedure Laterality Date   ? CORONARY ANGIOPLASTY WITH STENT PLACEMENT     ? CORONARY ARTERY BYPASS GRAFT     ? PACEMAKER     ? STERNOTOMY         Family History:   History reviewed. No pertinent family history.    Social History:   Social History     Social History   ? Marital status: Unknown     Spouse name: N/A   ? Number of children: N/A   ? Years of education: N/A     Social History Main Topics   ? Smoking status: Current Every Day Smoker     Packs/day: 0.25     Years: 15.00     Types: Cigarettes   ? Smokeless tobacco: Never Used   ? Alcohol use No   ? Drug use: No   ? Sexual activity: Not Asked     Other Topics Concern   ? None     Social History Narrative   ? None       Allergies:  No Known Allergies    Review Of Systems:  Constitutional: No fevers or chills   Eyes: no visual blurriness, no discharge  CV: No palpitations, no claudication  GI: No nausea, no abdominal pain, no vomiting  MSK: No trauma, just pain at AICD insertion site  Skin: no rashes  Neuro: baseline LU weakness and dysarthria   All other systems reviewed and negative

## 2017-06-25 NOTE — Plan of Care
Problem: Pain, Acute & Chronic  Goal: Pain is relieved/acceptable level with minimal side effects  Outcome: Ongoing  Denies pain at this time.     Problem: Risk for Injury R/T Falls  Goal: Use a bed and/or chair alarm    Intervention: Fall precautions (yellow armband, signage posted, low bed, call bell within reach, three side rails on bed, bed lock, bed/chair alarm, walking aids, declutter room, etc.)  Educated on fall precautions. Refused bed alarm. Instructed pt to call for assistance before getting out of bed. Pt acknowledged understanding.

## 2017-06-25 NOTE — ED Attestation Note
Attestation   I discussed this patient with the resident/fellow.              Kinnie ScalesGuirgis, Faheem W, MD  06/24/17 [phone]

## 2017-06-25 NOTE — Progress Notes
Patient arrived to unit via stretcher. Alert and orientedx4. Denies pain at this time. Patient oriented to room, staff, call bell system. Fall precautions in place.

## 2017-06-25 NOTE — Progress Notes
Blood, UA Negative Negative    WBC, UA Negative Negative    Bilirubin -Ur Negative Negative    Ketones -UR Negative Negative   POCT TROPININ I    Collection Time: 06/24/17  9:36 AM   Result Value Ref Range    Troponin I (Point of Care) <0.05 0.00 - 0.23 ng/mL   Troponin T    Collection Time: 06/24/17  2:34 PM   Result Value Ref Range    Troponin T <0.01 0.00 - 0.04 ng/ml   CK    Collection Time: 06/24/17  2:34 PM   Result Value Ref Range    Total CK 189 22 - 195 U/L   CKMB    Collection Time: 06/24/17  2:34 PM   Result Value Ref Range    CK-MB 2.6 <=5.0 ng/mL   Troponin T    Collection Time: 06/24/17  5:53 PM   Result Value Ref Range    Troponin T <0.01 0.00 - 0.04 ng/ml   CK    Collection Time: 06/24/17  5:53 PM   Result Value Ref Range    Total CK 192 22 - 195 U/L   CKMB    Collection Time: 06/24/17  5:53 PM   Result Value Ref Range    CK-MB 2.6 <=5.0 ng/mL   CBC with Differential panel result    Collection Time: 06/25/17  4:33 AM   Result Value Ref Range    WBC 4.41 (L) 4.5 - 11 x10E3/uL    RBC 4.03 (L) 4.50 - 6.30 x10E6/uL    Hemoglobin 11.2 (L) 14.0 - 18.0 g/dL    Hematocrit 16.135.5 (L) 40.0 - 54.0 %    MCV 88.1 82.0 - 101.0 fl    MCH 27.8 27.0 - 34.0 pg    MCHC 31.5 31.0 - 36.0 g/dL    RDW 09.615.8 04.512.0 - 40.916.1 %    Platelet Count 151 140 - 440 thou/cu mm    MPV 10.3 9.5 - 11.5 fl    nRBC % 0.0 0.0 - 1.0 %    Absolute NRBC Count 0.00     Neutrophils % 35.5 34.0 - 73.0 %    Lymphocytes % 46.7 (H) 25.0 - 45.0 %    Monocytes % 14.7 (H) 2.0 - 6.0 %    Eosinophils % 2.0 1.0 - 4.0 %    Immature Granulocytes % 0.2 0.0 - 2.0 %    Neutrophils Absolute 1.56 (L) 1.80 - 8.70 x10E3/uL    Lymphocytes Absolute 2.06 x10E3/uL    Monocytes Absolute 0.65 x10E3/uL    Eosinophils Absolute 0.09 x10E3/uL    Basophil Absolute 0.04 x10E3/uL    Absolute Immature Granulocytes 0.01 (H) 0 - 0 x10E3/uL    Basophils % 0.9 0 - 1 %       Radiology:  Per Radiology: Ct Head W/o Iv Con  Result Date: 06/24/2017

## 2017-06-25 NOTE — Progress Notes
Outpatient Medications:  Prior to Admission medications    Not on File         Inpatient Medications:  Scheduled:  ? aspirin  81 mg Oral daily   ? enoxaparin  40 mg Subcutaneous daily     PRN:    dextrose 30 mL Intravenous PRN   glucose 16 g Oral PRN       IV infusions:         OBJECTIVE:     Vitals:    06/24/17 1915 06/24/17 2101 06/25/17 0048 06/25/17 0442   BP: 118/78 139/65 127/58 141/63   Pulse: 95 61 63 60   Resp: 16 16 16 16    Temp: 37.1 ?C (98.7 ?F) 36.4 ?C (97.5 ?F) 36.8 ?C (98.2 ?F) 36.7 ?C (98 ?F)   TempSrc: Oral Oral Oral Oral   SpO2: 97% 100% 94% 100%   Weight:  70.4 kg (155 lb 3.2 oz)     Height:  1.854 m (6\' 1" )         SpO2: 100 % (01/19 0442)    Respiratory Device: Room Air / (None)   Pain Rating ([facility] Only) : 0  Score: FLACC : 0      Intake/Output Summary (Last 24 hours) at 06/25/17 0647  Last data filed at 06/25/17 0207   Gross per 24 hour   Intake                0 ml   Output              260 ml   Net             -260 ml         Physical Exam:   GEN: NAD. Appears to be stated age. Dysarthric   HEENT: NCAT. Trachea midline. MMM.Supple neck.  LYMPHATICS: no anterior cervical or  submandibular lymphadenopathy  CV: S1, S2. Regular rhythm. No MRG. AICD mildly tender. No surrounding erythema.   LUNGS: CTAB. No Rales, Rhonchi, Wheezes. No crackles.  Bilateral air entry.   ABDOMEN: Soft, +BS. Non Tender. Non Distended. No HSM.  EXTREMITIES: No clubbing, cyanosis, edema. 2+ distal pulses.   NEURO: Alert and oriented.   SKIN: no rashes or lesions. Warm and moist.     Lines: piv    Microbiology: none     Labs:   Recent Results (from the past 24 hour(s))   POCT Urinalysis w/o Microscopy auto    Collection Time: 06/24/17  9:14 AM   Result Value Ref Range    Color -Ur Amber     Clarity, UA Clear     Spec Grav 1.020 1.003 - 1.030    pH 5.0 4.5 - 8.0    Urobilinogen -Ur 0.2 <=2.0 E.U./dL    Nitrite -Ur Negative Negative    Protein-UA Negative Negative mg/dL    Glucose -Ur Negative Negative mg/dL

## 2017-06-25 NOTE — ED Attestation Note
Attestation   I saw and evaluated the patient. I reviewed and agree with the findings and plan as documented in the note. I reviewed the patient's history. I have made corrections and additions as appropriate. I discussed this patient with the resident/fellow.       DOS 06/24/17       Kinnie ScalesGuirgis, Faheem W, MD  06/24/17 [id number]       Kinnie ScalesGuirgis, Faheem W, MD  06/25/17 1724

## 2017-06-25 NOTE — Consults
little (min assist/CGA/SBA/SUP); 4 = none (independent or modified independent).    AM-PAC BASIC MOBILITY SCALE SHORT FORM (without stairs): Raw Score: 17. AM-PAC Score: 48.47. CMS Score: 32.72% - CJ    Treatment on Evaluation   ? TREATMENT TIME: 23 min  ? TRANSFERS - Sit to/from Stand: Stand by Assist (SBA) with Rolling Walker  ? GAIT: Patient ambulated 10 ft , 30 ft x 2 , 40 ft , 20 ft  with Rolling Walker, gait belt, Supervision (S). Gait Quality: slow cadence needs vc for wide BOS and L foot positioning to decrease risk for fall and needs short standing rest breaks to minimized fatigue   ? BALANCE ACTIVITIES: static / dynamic  standing at the sink with unilateral support to wash his face needs vc for wide BOS tends to move L LE pass midline at times and reaching during standing   ? PATIENT AND/OR CAREGIVER EDUCATION: Patient/family educated on safe mobility with gait with RW , precautions, fall prevention strategies and transfers     Post Treatment:   Patient Position/Safety: Sitting edge of bed, Call Bell within reach, Tray table within reach, All lines and leads intact, Handoff to nurse on patient position    Assessment: Patient presents declined in function he needs SBA for transfer and gait with RW . Pt also demonstrating decrease step quality on L LE due to residual weakness from CVA . Pt also required short standing rest breaks during gait due to fatigue easily but no c/o of CP or dizziness . PT recommend pt go in house shelter due to recent pacemaker  ALF or in house shelter if pt decides to stay here in [place]   once medically stable to d/c . Will continue PT to improve functional mobility and gait .     Problem list:  ? impaired transfers  ? impaired gait  ? decreased LLE  strength  ? balance deficits  ? increased risk for falls  ? decreased activity tolerance    Other Recommended Services: Case Mgt for d/c placement     Discharge Recommendations:

## 2017-06-25 NOTE — Progress Notes
PROCEDURE:  CT HEAD W/O IV CON CLINICAL INDICATION: [age] years Male Pain TECHNIQUE: Noncontrast   No acute intracranial hemorrhage or intracranial mass effect. Scattered punctate calcifications in the right temporal lobe and in the cerebellum as well as in the extra axial space adjacent to the cerebellum. These are nonspecific but are likely the chronic sequela of a previous remote infectious process such as, for example, neurocysticercosis. Periodontal disease as described above.     Per Radiology: Xr Chest Single View  Result Date: 06/24/2017  No acute cardiopulmonary process. Small right lower lung zone nodularity. Recommend dedicated PA and lateral radiograph the chest when patient's condition permits. This could also represent  summation of shadows.    Per Radiology: Cta Chest Pe Protocol W/o & W/ Iv Con  Result Date: 06/24/2017  No evidence of pulmonary embolus. Scattered atherosclerotic vascular disease of the aorta and branching vessels. Punctate foci of gas surrounding the pacemaker. No evidence of organized fluid collection, however evaluation is limited due to streak artifact by pacemaker. Evaluation of left upper chest soft tissues is recommended.       EKG: TWI in II, III, AVF, V3-6    ASSESSMENT/PLAN:     Antonio Hunter is a 77 y.o. 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     ACS r/o   -non-cardiac chest pain which resolved prior to admission  -pain likely secondary to AICD  -negative cardiac biomarker trend   -EKG shows stable TWI    Homelessness  -will f/u PT/OT recs  -[place]   - cleared from PT/OT standpoint     Prophylaxis-lovenox sub q   Code Status-full  Dispo- Possible discharge today.         Clement HusbandsBarrett Attarha, DO   [facility], PGY-1  Pager: [phone]  06/25/2017 6:54 AM

## 2017-06-28 NOTE — H&P
?   None       Allergies:  No Known Allergies    Review Of Systems:  Constitutional: No fevers or chills   Eyes: no visual blurriness, no discharge  CV: No palpitations, no claudication  GI: No nausea, no abdominal pain, no vomiting  MSK: No trauma, just pain at AICD insertion site  Skin: no rashes  Neuro: baseline LU weakness and dysarthria   All other systems reviewed and negative     Outpatient Medications:  Prior to Admission medications    Not on File         Inpatient Medications:  Scheduled:  ? enoxaparin  40 mg Subcutaneous daily     PRN:    dextrose 30 mL Intravenous PRN   glucose 16 g Oral PRN       IV infusions:         OBJECTIVE:     Vitals:    06/24/17 1154 06/24/17 1246 06/24/17 1257 06/24/17 1543   BP: 133/77  133/77 144/68   Pulse: 62 66 63 60   Resp: 20 18 19     Temp:       TempSrc:       SpO2: 100% 100% 100% 100%   Weight:       Height:           SpO2: 100 % (01/18 1543)    Respiratory Device: Room Air / (None)        No intake or output data in the 24 hours ending 06/24/17 1915      Physical Exam:   GEN: NAD. Appears to be stated age. Dysarthric   HEENT: NCAT. Trachea midline. MMM.Supple neck.  LYMPHATICS: no anterior cervical or  submandibular lymphadenopathy  CV: S1, S2. Regular rhythm. No MRG. AICD mildly tender. No surrounding erythema.   LUNGS: CTAB. No Rales, Rhonchi, Wheezes. No crackles.  Bilateral air entry.   ABDOMEN: Soft, +BS. Non Tender. Non Distended. No HSM.  EXTREMITIES: No clubbing, cyanosis, edema. 2+ distal pulses.   NEURO: Alert and oriented.   SKIN: no rashes or lesions. Warm and moist.     Lines: piv    Microbiology: none     Labs:   Recent Results (from the past 24 hour(s))   ECG - Tracing (autopage)    Collection Time: 06/24/17  4:18 AM   Result Value Ref Range    Ventricular Rate 74 BPM    Atrial Rate 74 BPM    P-R Interval 146 ms    QRS Duration 122 ms    Q-T Interval 412 ms    QTC Calculation (Bezet) 457 ms    Calculated P Axis 77 degrees    Calculated R Axis -13 degrees

## 2017-06-28 NOTE — H&P
Department of Medicine  Internal Medicine Team B  History and Physical  Patient: Antonio Hunter  MRN: [id number]  DOB: 07/24/1940      Primary Care Physician: No primary care provider on file.  Chief Complaint: Chest pain     History of Present Illness:     Antonio Hunter is a 77 y.o. 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 States he was at rest when pain started. He has not had much pain from AICD site. No shortness of breath or diaphoresis. No shocks from AICD. Smoker. Denies ETOH abuse or illegal drugs. Originally patient was supposed to go to facility in [place] but he did not go. [place] when his chest pain started. Admitted for placement and ACS rule out.     PMH:   Past Medical History:   Diagnosis Date   ? AICD (automatic cardioverter/defibrillator) present 06/17/2017   ? At high risk for falls    ? CAD (coronary artery disease)    ? CVA (cerebral vascular accident) (CMS-HCC code) 1996    residual Left sided weakness and slurred speech   ? Frequent falls    ? Homeless    ? HTN (hypertension)    ? Illiterate        PSH:  Past Surgical History:   Procedure Laterality Date   ? CORONARY ANGIOPLASTY WITH STENT PLACEMENT     ? CORONARY ARTERY BYPASS GRAFT     ? PACEMAKER     ? STERNOTOMY         Family History:   No family history on file.    Social History:   Social History     Social History   ? Marital status: Unknown     Spouse name: N/A   ? Number of children: N/A   ? Years of education: N/A     Social History Main Topics   ? Smoking status: Current Every Day Smoker     Packs/day: 0.50     Types: Cigarettes   ? Smokeless tobacco: Never Used   ? Alcohol use No   ? Drug use: No   ? Sexual activity: Not Asked     Other Topics Concern   ? None     Social History Narrative

## 2017-06-28 NOTE — H&P
Urobilinogen -Ur 0.2 <=2.0 E.U./dL    Nitrite -Ur Negative Negative    Protein-UA Negative Negative mg/dL    Glucose -Ur Negative Negative mg/dL    Blood, UA Negative Negative    WBC, UA Negative Negative    Bilirubin -Ur Negative Negative    Ketones -UR Negative Negative   POCT TROPININ I    Collection Time: 06/24/17  9:36 AM   Result Value Ref Range    Troponin I (Point of Care) <0.05 0.00 - 0.23 ng/mL   Troponin T    Collection Time: 06/24/17  2:34 PM   Result Value Ref Range    Troponin T <0.01 0.00 - 0.04 ng/ml   CK    Collection Time: 06/24/17  2:34 PM   Result Value Ref Range    Total CK 189 22 - 195 U/L   CKMB    Collection Time: 06/24/17  2:34 PM   Result Value Ref Range    CK-MB 2.6 <=5.0 ng/mL   Troponin T    Collection Time: 06/24/17  5:53 PM   Result Value Ref Range    Troponin T <0.01 0.00 - 0.04 ng/ml   CK    Collection Time: 06/24/17  5:53 PM   Result Value Ref Range    Total CK 192 22 - 195 U/L   CKMB    Collection Time: 06/24/17  5:53 PM   Result Value Ref Range    CK-MB 2.6 <=5.0 ng/mL       Radiology:  Per Radiology: Ct Head W/o Iv Con  Result Date: 06/24/2017  PROCEDURE:  CT HEAD W/O IV CON CLINICAL INDICATION: [age] years Male Pain TECHNIQUE: Noncontrast   No acute intracranial hemorrhage or intracranial mass effect. Scattered punctate calcifications in the right temporal lobe and in the cerebellum as well as in the extra axial space adjacent to the cerebellum. These are nonspecific but are likely the chronic sequela of a previous remote infectious process such as, for example, neurocysticercosis. Periodontal disease as described above.     Per Radiology: Xr Chest Single View  Result Date: 06/24/2017  No acute cardiopulmonary process. Small right lower lung zone nodularity. Recommend dedicated PA and lateral radiograph the chest when patient's condition permits. This could also represent  summation of shadows.    Per Radiology: Cta Chest Pe Protocol W/o & W/ Iv Con  Result Date: 06/24/2017

## 2017-06-28 NOTE — H&P
No evidence of pulmonary embolus. Scattered atherosclerotic vascular disease of the aorta and branching vessels. Punctate foci of gas surrounding the pacemaker. No evidence of organized fluid collection, however evaluation is limited due to streak artifact by pacemaker. Evaluation of left upper chest soft tissues is recommended.       EKG: TWI in II, III, AVF, V3-6    ASSESSMENT/PLAN:     Mr. Antonio Hunter is a 77 y.o. 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     ACS r/o   -non-cardiac chest pain which resolved prior to admission  -pain likely secondary to AICD  -negative cardiac biomarker trend   -EKG shows stable TWI    Homelessness  -will f/u PT/OT recs  -[place]     Prophylaxis-lovenox sub q   Code Status-full  Dispo- possible dc tomorrow, pending PT/OT eval     Antonio CoronaNancy Davison, DO   Internal Medicine PGY 2  Pager [phone]

## 2017-06-28 NOTE — H&P
Alkaline Phosphatase 176 (H) 40 - 129 IU/L    AST 20 14 - 33 IU/L    ALT 22 10 - 42 IU/L    Total Protein 7.7 6.5 - 8.3 g/dL    ALBUMIN/GLOBULIN RATIO 1.0 (calc)    Calc Total Globuin 3.8 gm/dL   Lipase    Collection Time: 06/24/17  6:14 AM   Result Value Ref Range    Lipase 24 0 - 60 U/L   Magnesium    Collection Time: 06/24/17  6:14 AM   Result Value Ref Range    Magnesium 2.2 1.8 - 2.6 mg/dL   PROTIME-INR    Collection Time: 06/24/17  6:14 AM   Result Value Ref Range    Protime 13.7 11.9 - 14.3 seconds    INR 1.1 0.9 - 1.1   D-Dimer,Quantitative    Collection Time: 06/24/17  6:14 AM   Result Value Ref Range    D Dimer (hs) 1.98 (H) 0.00 - 0.49 ug/mL (FEU)   CBC with Differential panel result    Collection Time: 06/24/17  6:14 AM   Result Value Ref Range    WBC 5.54 4.5 - 11 x10E3/uL    RBC 4.30 (L) 4.50 - 6.30 x10E6/uL    Hemoglobin 12.0 (L) 14.0 - 18.0 g/dL    Hematocrit 96.237.8 (L) 40.0 - 54.0 %    MCV 87.9 82.0 - 101.0 fl    MCH 27.9 27.0 - 34.0 pg    MCHC 31.7 31.0 - 36.0 g/dL    RDW 95.215.9 84.112.0 - 32.416.1 %    Platelet Count 138 (L) 140 - 440 thou/cu mm    MPV 10.3 9.5 - 11.5 fl    nRBC % 0.0 0.0 - 1.0 %    Absolute NRBC Count 0.00     Neutrophils % 58.7 34.0 - 73.0 %    Lymphocytes % 27.4 25.0 - 45.0 %    Monocytes % 11.6 (H) 2.0 - 6.0 %    Eosinophils % 1.1 1.0 - 4.0 %    Immature Granulocytes % 0.5 0.0 - 2.0 %    Neutrophils Absolute 3.25 1.80 - 8.70 x10E3/uL    Lymphocytes Absolute 1.52 x10E3/uL    Monocytes Absolute 0.64 x10E3/uL    Eosinophils Absolute 0.06 x10E3/uL    Basophil Absolute 0.04 x10E3/uL    Absolute Immature Granulocytes 0.03 (H) 0 - 0 x10E3/uL    Basophils % 0.7 0 - 1 %   POCT TROPININ I    Collection Time: 06/24/17  6:31 AM   Result Value Ref Range    Troponin I (Point of Care) <0.05 0.00 - 0.23 ng/mL   POCT Urinalysis w/o Microscopy auto    Collection Time: 06/24/17  9:14 AM   Result Value Ref Range    Color -Ur Amber     Clarity, UA Clear     Spec Grav 1.020 1.003 - 1.030    pH 5.0 4.5 - 8.0

## 2017-06-28 NOTE — H&P
Calculated T Axis -104 degrees   ECG - Adult (autopage)    Collection Time: 06/24/17  4:18 AM   Result Value Ref Range    Ventricular Rate 74 BPM    Atrial Rate 74 BPM    P-R Interval 146 ms    QRS Duration 122 ms    Q-T Interval 412 ms    QTC Calculation (Bezet) 457 ms    Calculated P Axis 77 degrees    Calculated R Axis -13 degrees    Calculated T Axis -104 degrees   ECG - REPEAT (autopage)    Collection Time: 06/24/17  4:50 AM   Result Value Ref Range    Ventricular Rate 71 BPM    Atrial Rate 71 BPM    P-R Interval 142 ms    QRS Duration 114 ms    Q-T Interval 436 ms    QTC Calculation (Bezet) 473 ms    Calculated P Axis 66 degrees    Calculated R Axis -16 degrees    Calculated T Axis -103 degrees   POCT Urinalysis w/o Microscopy auto    Collection Time: 06/24/17  5:33 AM   Result Value Ref Range    Color -Ur Amber     Clarity, UA Clear     Spec Grav 1.015 1.003 - 1.030    pH 5.5 4.5 - 8.0    Urobilinogen -Ur 1.0 <=2.0 E.U./dL    Nitrite -Ur Negative Negative    Protein-UA Negative Negative mg/dL    Glucose -Ur Negative Negative mg/dL    Blood, UA Negative Negative    WBC, UA Negative Negative    Bilirubin -Ur Negative Negative    Ketones -UR Negative Negative   Basic Metabolic Panel    Collection Time: 06/24/17  6:14 AM   Result Value Ref Range    Sodium 132 (L) 135 - 145 mmol/L    Potassium 4.8 (H) 3.3 - 4.6 mmol/L    Chloride 97 (L) 101 - 110 mmol/L    CO2 24 21 - 29 mmol/L    Urea Nitrogen 23 (H) 6 - 22 mg/dL    Creatinine 1.15 0.67 - 1.17 mg/dL    BUN/Creatinine Ratio 20.0 6.0 - 22.0 (calc)    Glucose 108 (H) 71 - 99 mg/dL    Calcium 10.0 8.6 - 10.0 mg/dL    Osmolality Calc 268.7     Anion Gap 11 4 - 16 mmol/L    EGFR >59 mL/min/1.73M2   Hepatic Function Panel    Collection Time: 06/24/17  6:14 AM   Result Value Ref Range    Albumin 3.9 3.8 - 4.9 g/dL    Total Bilirubin 0.7 0.2 - 1.0 mg/dL    Bilirubin, Direct 0.2 0.0 - 0.2 mg/dL    Bilirubin, Indirect 0.5 <0.69m/dL mg/dL

## 2019-12-12 DIAGNOSIS — N400 Enlarged prostate without lower urinary tract symptoms: Secondary | ICD-10-CM | POA: Insufficient documentation

## 2019-12-12 DIAGNOSIS — I5042 Chronic combined systolic (congestive) and diastolic (congestive) heart failure: Secondary | ICD-10-CM | POA: Insufficient documentation

## 2019-12-12 DIAGNOSIS — I5022 Chronic systolic (congestive) heart failure: Secondary | ICD-10-CM | POA: Insufficient documentation

## 2020-09-15 ENCOUNTER — Emergency Department (HOSPITAL_COMMUNITY): Payer: Medicare (Managed Care)

## 2020-09-15 ENCOUNTER — Other Ambulatory Visit: Payer: Self-pay

## 2020-09-15 ENCOUNTER — Emergency Department (HOSPITAL_COMMUNITY)
Admission: EM | Admit: 2020-09-15 | Discharge: 2020-09-15 | Disposition: A | Discharge status: Home / Self Care | Payer: Medicare (Managed Care) | Attending: Emergency Medicine | Admitting: Emergency Medicine

## 2020-09-15 DIAGNOSIS — M25512 Pain in left shoulder: Secondary | ICD-10-CM | POA: Insufficient documentation

## 2020-09-15 DIAGNOSIS — I2510 Atherosclerotic heart disease of native coronary artery without angina pectoris: Secondary | ICD-10-CM | POA: Insufficient documentation

## 2020-09-15 DIAGNOSIS — Z950 Presence of cardiac pacemaker: Secondary | ICD-10-CM | POA: Insufficient documentation

## 2020-09-15 DIAGNOSIS — W19XXXA Unspecified fall, initial encounter: Secondary | ICD-10-CM

## 2020-09-15 DIAGNOSIS — M25511 Pain in right shoulder: Secondary | ICD-10-CM | POA: Diagnosis not present

## 2020-09-15 DIAGNOSIS — R531 Weakness: Secondary | ICD-10-CM

## 2020-09-15 DIAGNOSIS — G8114 Spastic hemiplegia affecting left nondominant side: Secondary | ICD-10-CM | POA: Diagnosis not present

## 2020-09-15 DIAGNOSIS — I10 Essential (primary) hypertension: Secondary | ICD-10-CM | POA: Insufficient documentation

## 2020-09-15 DIAGNOSIS — W1830XA Fall on same level, unspecified, initial encounter: Secondary | ICD-10-CM | POA: Diagnosis not present

## 2020-09-15 DIAGNOSIS — Z8616 Personal history of COVID-19: Secondary | ICD-10-CM | POA: Insufficient documentation

## 2020-09-15 DIAGNOSIS — R12 Heartburn: Secondary | ICD-10-CM | POA: Diagnosis not present

## 2020-09-15 LAB — URINALYSIS, ROUTINE W REFLEX MICROSCOPIC
Bilirubin Urine: NEGATIVE
Glucose, UA: NEGATIVE mg/dL
Ketones, ur: 5 mg/dL — AB
Nitrite: NEGATIVE
Protein, ur: 30 mg/dL — AB
Specific Gravity, Urine: 1.015 (ref 1.005–1.030)
pH: 6 (ref 5.0–8.0)

## 2020-09-15 LAB — CBC WITH DIFFERENTIAL/PLATELET
Abs Immature Granulocytes: 0.03 10*3/uL (ref 0.00–0.07)
Basophils Absolute: 0 10*3/uL (ref 0.0–0.1)
Basophils Relative: 0 %
Eosinophils Absolute: 0 10*3/uL (ref 0.0–0.5)
Eosinophils Relative: 0 %
HCT: 35.1 % — ABNORMAL LOW (ref 39.0–52.0)
Hemoglobin: 10.7 g/dL — ABNORMAL LOW (ref 13.0–17.0)
Immature Granulocytes: 0 %
Lymphocytes Relative: 8 %
Lymphs Abs: 0.6 10*3/uL — ABNORMAL LOW (ref 0.7–4.0)
MCH: 25.7 pg — ABNORMAL LOW (ref 26.0–34.0)
MCHC: 30.5 g/dL (ref 30.0–36.0)
MCV: 84.2 fL (ref 80.0–100.0)
Monocytes Absolute: 0.7 10*3/uL (ref 0.1–1.0)
Monocytes Relative: 8 %
Neutro Abs: 6.6 10*3/uL (ref 1.7–7.7)
Neutrophils Relative %: 84 %
Platelets: 196 10*3/uL (ref 150–400)
RBC: 4.17 MIL/uL — ABNORMAL LOW (ref 4.22–5.81)
RDW: 16.2 % — ABNORMAL HIGH (ref 11.5–15.5)
WBC: 7.9 10*3/uL (ref 4.0–10.5)
nRBC: 0 % (ref 0.0–0.2)

## 2020-09-15 LAB — COMPREHENSIVE METABOLIC PANEL
ALT: 11 U/L (ref 0–44)
AST: 16 U/L (ref 15–41)
Albumin: 3.8 g/dL (ref 3.5–5.0)
Alkaline Phosphatase: 154 U/L — ABNORMAL HIGH (ref 38–126)
Anion gap: 11 (ref 5–15)
BUN: 22 mg/dL (ref 8–23)
CO2: 22 mmol/L (ref 22–32)
Calcium: 10.1 mg/dL (ref 8.9–10.3)
Chloride: 103 mmol/L (ref 98–111)
Creatinine, Ser: 1.02 mg/dL (ref 0.61–1.24)
GFR, Estimated: >60 mL/min (ref >60)
Glucose, Bld: 143 mg/dL — ABNORMAL HIGH (ref 70–99)
Potassium: 3.7 mmol/L (ref 3.5–5.1)
Sodium: 136 mmol/L (ref 135–145)
Total Bilirubin: 0.7 mg/dL (ref 0.3–1.2)
Total Protein: 7 g/dL (ref 6.5–8.1)

## 2020-09-15 LAB — PROTIME-INR
INR: 1.2 (ref 0.8–1.2)
Prothrombin Time: 14.3 seconds (ref 11.4–15.2)

## 2020-09-15 MED ORDER — CEPHALEXIN 500 MG PO CAPS: 500.0000 mg | ORAL_CAPSULE | Freq: Three times a day (TID) | ORAL | 0 refills | Status: AC

## 2020-09-15 MED ORDER — CEPHALEXIN 250 MG PO CAPS
500.0000 mg | ORAL_CAPSULE | Freq: Once | ORAL | Status: AC
  Administered 2020-09-15: 500 mg via ORAL
  Filled 2020-09-15: qty 2

## 2020-09-15 NOTE — Discharge Instructions (Addendum)
It was our pleasure to provide your ER care today - we hope that you feel better.  The lab tests show a possible urine infection - take antibiotic as prescribed.   Drink plenty of fluids/stay well hydrated.   Follow up with primary care doctor in 1-2 weeks.   Return to ER if worse, new symptoms, high fevers, new or severe pain, increased trouble breathing, persistent vomiting, fainting, or other emergency concern.

## 2020-09-15 NOTE — ED Provider Notes (Signed)
[facility] EMERGENCY DEPARTMENT Provider Note   CSN: [id number] Arrival date & time: 09/15/20  3154     History Chief Complaint  Patient presents with  . Fall    Todd Dunn is a 79 y.o. male.  HPI Patient presents after a fall.  He is here with his granddaughter who assists with the history.  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  Patient typically resides in a care home.  However, the patient spends 1 week in a month with family members. She notes that the patient had a fall possibly 2 days ago, of which she was only recently became aware.  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  He was able to do so yesterday, the day after the accident, but today, with worsening pain in his shoulders, burning sensation in his chest around his pacemaker, he was brought here for evaluation.  No report of new weakness, no report of new speech changes, no report of falls today, following his being upright, bearing weight yesterday.    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  Former smoker Social, medical, surgical obtained on electronic chart  accessed from [place] Medications Prior to Admission medications   Not on File    Allergies    Penicillins  Review of Systems   Review of Systems  Constitutional:       Per HPI, otherwise negative  HENT:       Per HPI, otherwise negative  Respiratory:       Per HPI, otherwise negative  Cardiovascular:       Per HPI, otherwise negative  Gastrointestinal: Negative for vomiting.  Endocrine:       Negative aside from HPI  Genitourinary:       Neg aside from HPI   Musculoskeletal:       Per HPI, otherwise negative  Skin: Negative.   Neurological: Positive for speech difficulty and weakness. Negative for syncope.    Physical Exam Updated Vital Signs BP (!) 160/91 (BP Location: Left Arm)   Pulse 85   Temp 98.6 F (37 C) (Oral)   Resp (!) 24   SpO2 100%   Physical Exam Vitals and nursing note reviewed.  Constitutional:      General: He is not in acute distress.    Appearance: He is well-developed.  HENT:     Head: Normocephalic and atraumatic.  Eyes:     Conjunctiva/sclera: Conjunctivae normal.  Neck:  Comments: No gross trauma on the neck, and the patient does move freely, but indicates pain with palpation of the midline. Cardiovascular:     Rate and Rhythm: Normal rate and regular rhythm.  Pulmonary:     Effort: Pulmonary effort is normal. No respiratory distress.     Breath sounds: No stridor.  Chest:    Abdominal:     General: There is no distension.  Skin:    General: Skin is warm and dry.  Neurological:     Mental Status: He is alert and oriented to person, place, and time.     Cranial Nerves: Cranial nerve deficit and dysarthria present.     Comments: Patient has left-sided hemiparesis, but does move his left upper extremity more than lower to position himself in the bed.  Right side unremarkable.  Patient notes no changes in his left-sided deficits, corroborated by family member.  Psychiatric:     Comments:  Inconsistently interactive      ED Results / Procedures / Treatments   Labs (all labs ordered are listed, but only abnormal results are displayed) Labs Reviewed  COMPREHENSIVE METABOLIC PANEL - Abnormal; Notable for the following components:      Result Value   Glucose, Bld 143 (*)    Alkaline Phosphatase 154 (*)    All other components within normal limits  CBC WITH DIFFERENTIAL/PLATELET - Abnormal; Notable for the following components:   RBC 4.17 (*)    Hemoglobin 10.7 (*)    HCT 35.1 (*)    MCH 25.7 (*)    RDW 16.2 (*)    Lymphs Abs 0.6 (*)    All other components within normal limits  PROTIME-INR  URINALYSIS, ROUTINE W REFLEX MICROSCOPIC    EKG EKG Interpretation  Date/Time:  Monday September 15 2020 10:00:24 EDT Ventricular Rate:  95 PR Interval:  139 QRS Duration: 115 QT Interval:  352 QTC Calculation: 443 R Axis:   0 Text Interpretation: Sinus rhythm Incomplete left bundle branch block ST-t wave abnormality Artifact Abnormal ECG Confirmed by Carmin Muskrat [phone]) on 09/15/2020 10:13:32 AM   Radiology DG Chest 2 View  Result Date: 09/15/2020 CLINICAL DATA:  Pain following fall EXAM: CHEST - 2 VIEW COMPARISON:  None FINDINGS: The lungs are clear. Heart size and pulmonary vascularity are normal. Patient is status post internal mammary bypass grafting. Pacemaker leads are attached to the right atrium and right ventricle. No evident adenopathy. No pneumothorax. No evident fracture. IMPRESSION: The pacemaker leads attached to right atrium and right ventricle. Heart size normal. Lungs clear. Aortic Atherosclerosis (ICD10-I70.0). Electronically Signed   By: Lowella Grip III M.D.   On: 09/15/2020 11:30   DG Shoulder Right  Result Date: 09/15/2020 CLINICAL DATA:  Pain following fall EXAM: RIGHT SHOULDER - 2+ VIEW COMPARISON:  None. FINDINGS: Frontal and Y scapular images were obtained. No fracture or dislocation. There is moderate generalized joint space narrowing. There  is slight bony overgrowth along the inferior aspect of the acromioclavicular joint. No erosion. Visualized right lung clear. 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 No fracture or dislocation. Mild bony overgrowth along the inferior acromioclavicular joint potentially places patient at increased risk for impingement syndrome. Electronically Signed   By: Lowella Grip III M.D.   On: 09/15/2020 11:28   CT Head Wo Contrast  Result Date: 09/15/2020 CLINICAL DATA:  80 year old male status post fall.  Trauma. EXAM: CT HEAD WITHOUT CONTRAST TECHNIQUE: Contiguous axial images were obtained from the base of the skull through  the vertex without intravenous contrast. COMPARISON:  None. FINDINGS: Brain: No midline shift, ventriculomegaly, mass effect, evidence of mass lesion, intracranial hemorrhage or evidence of cortically based acute infarction. Mild to moderate for age patchy bilateral white matter hypodensity. No cortical encephalomalacia identified. Vascular: Calcified atherosclerosis at the skull base. No suspicious intracranial vascular hyperdensity. Skull: No skull fracture. Generalized hyperostosis of the calvarium. Occasional small lucent areas which are favored to be benign such as venous lakes. Sinuses/Orbits: Visualized paranasal sinuses and mastoids are clear. Other: Left forehead benign scalp lipoma incidentally noted. Right posterior vertex scalp hematoma suspected on series 4, image 67. IMPRESSION: 1. Posterior vertex scalp hematoma suspected without underlying skull fracture. 2. No acute traumatic injury to the brain identified. Mild to moderate for age changes of chronic small vessel disease. Electronically Signed   By: Genevie Ann M.D.   On: 09/15/2020 11:51   CT Cervical Spine Wo Contrast  Result Date: 09/15/2020 CLINICAL DATA:  80 year old male status post fall. Trauma. EXAM: CT CERVICAL SPINE WITHOUT CONTRAST TECHNIQUE: Multidetector CT imaging of the  cervical spine was performed without intravenous contrast. Multiplanar CT image reconstructions were also generated. COMPARISON:  Head CT reported separately today. FINDINGS: Alignment: Mild straightening of cervical lordosis. Leftward flexion of the head or mild dextroconvex scoliosis. Cervicothoracic junction alignment is within normal limits. Bilateral posterior element alignment is within normal limits. Skull base and vertebrae: C1-C2 alignment is satisfactory for the degree of head rotation. No acute osseous abnormality identified. Small endplate Schmorl's node suspected in the cervical spine. Background bone mineralization within normal limits. Soft tissues and spinal canal: No prevertebral fluid or swelling. No visible canal hematoma. Calcified carotid atherosclerosis, otherwise negative visible noncontrast neck soft tissues. Disc levels: Widespread advanced cervical disc and endplate degeneration. Mild to moderate degenerative spinal stenosis suspected at C3-C4, with associated severe left C4 foraminal stenosis. Mild spinal stenosis suspected at C5-C6. Upper chest: Grossly intact visible upper thoracic levels. Negative lung apices. Partially visible left chest pacemaker type device. Other: None. IMPRESSION: 1. No acute traumatic injury identified in the cervical spine. 2. Widespread cervical disc and endplate degeneration. Up to moderate degenerative spinal stenosis suspected at C3-C4, mild at C5-C6. Electronically Signed   By: Genevie Ann M.D.   On: 09/15/2020 11:54   DG Shoulder Left  Result Date: 09/15/2020 CLINICAL DATA:  Pain following fall EXAM: LEFT SHOULDER - 2+ VIEW COMPARISON:  None. FINDINGS: Frontal and Y scapular images obtained. No acute fracture or dislocation. Evidence of prior Hill-Sachs defect. There is moderate generalized joint space narrowing. There is bony overgrowth along the inferolateral aspect of the acromioclavicular joint. No erosion. Pacemaker device noted overlying the left  apex. Visualized left lung clear. IMPRESSION: No acute fracture or dislocation evident. Evidence of Hill-Sachs defect. Moderate joint space narrowing consistent with osteoarthritic change. Bony overgrowth along the inferior acromioclavicular joint places patient at potential increased risk for impingement syndrome. Electronically Signed   By: Lowella Grip III M.D.   On: 09/15/2020 11:29    Procedures Procedures   Medications Ordered in ED Medications - No data to display  ED Course  I have reviewed the triage vital signs and the nursing notes.  Pertinent labs & imaging results that were available during my care of the patient were reviewed by me and considered in my medical decision making (see chart for details).    3:33 PM On repeat exam the patient is calm, in no distress, hemodynamically unremarkable. Have discussed findings thus far with the patient's daughter.  No x-ray or CT evidence for acute new pathology.  Labs consistent with prior studies, no decrease in renal function, no leukocytosis, and absent fever, low suspicion for bacteremia, sepsis. Patient is on prophylactic antibiotics for recurrent urinary tract infection, and urinalysis is pending.  Patient likely appropriate for discharge, though discharge medications are dependent on remaining urinalysis results.  Dr. Ashok Cordia is aware of the patient. MDM Rules/Calculators/A&P MDM Number of Diagnoses or Management Options Fall, initial encounter: new, needed workup Weakness: new, needed workup   Amount and/or Complexity of Data Reviewed Clinical lab tests: reviewed and ordered Tests in the radiology section of CPT: reviewed and ordered Tests in the medicine section of CPT: reviewed and ordered Decide to obtain previous medical records or to obtain history from someone other than the patient: yes Obtain history from someone other than the patient: yes Review and summarize past medical records: yes Independent  visualization of images, tracings, or specimens: yes  Risk of Complications, Morbidity, and/or Mortality Presenting problems: high Diagnostic procedures: high Management options: high  Critical Care Total time providing critical care: < 30 minutes  Patient Progress Patient progress: stable  Final Clinical Impression(s) / ED Diagnoses Final diagnoses:  Fall, initial encounter  Weakness     Carmin Muskrat, MD 09/15/20 1535

## 2020-09-15 NOTE — ED Triage Notes (Signed)
Patient stated he fell yesterday and is on blood thinners d/t pacemaker. Patient has some difficulty verbally expressing himself.

## 2020-09-15 NOTE — ED Notes (Signed)
Patient transported to X-ray 

## 2020-09-15 NOTE — ED Notes (Signed)
Pain medication requested per daughter. MD aware.

## 2020-09-15 NOTE — ED Provider Notes (Signed)
Signed out by Dr Vanita Panda to d/c when ua resulted.  UA w possible uti, LE +, many bact, although 0 wbc.   Keflex po.   Recheck pt, comfortable, alert, nad.   Pt currently appears stable for d/c.   Return precautions provided.      Lajean Saver, MD 09/15/20 2015

## 2020-11-08 ENCOUNTER — Emergency Department (HOSPITAL_COMMUNITY)
Admission: EM | Admit: 2020-11-08 | Discharge: 2020-11-09 | Disposition: A | Discharge status: Home / Self Care | Payer: Medicare (Managed Care) | Attending: Emergency Medicine | Admitting: Emergency Medicine

## 2020-11-08 ENCOUNTER — Other Ambulatory Visit: Payer: Self-pay

## 2020-11-08 DIAGNOSIS — N4829 Other inflammatory disorders of penis: Secondary | ICD-10-CM | POA: Diagnosis not present

## 2020-11-08 DIAGNOSIS — T839XXA Unspecified complication of genitourinary prosthetic device, implant and graft, initial encounter: Secondary | ICD-10-CM

## 2020-11-08 DIAGNOSIS — T83091A Other mechanical complication of indwelling urethral catheter, initial encounter: Secondary | ICD-10-CM | POA: Diagnosis not present

## 2020-11-08 DIAGNOSIS — Y846 Urinary catheterization as the cause of abnormal reaction of the patient, or of later complication, without mention of misadventure at the time of the procedure: Secondary | ICD-10-CM | POA: Diagnosis not present

## 2020-11-08 DIAGNOSIS — R8290 Unspecified abnormal findings in urine: Secondary | ICD-10-CM | POA: Diagnosis not present

## 2020-11-08 LAB — CBC WITH DIFFERENTIAL/PLATELET
Abs Immature Granulocytes: 0.02 10*3/uL (ref 0.00–0.07)
Basophils Absolute: 0 10*3/uL (ref 0.0–0.1)
Basophils Relative: 1 %
Eosinophils Absolute: 0 10*3/uL (ref 0.0–0.5)
Eosinophils Relative: 1 %
HCT: 31.4 % — ABNORMAL LOW (ref 39.0–52.0)
Hemoglobin: 9.4 g/dL — ABNORMAL LOW (ref 13.0–17.0)
Immature Granulocytes: 0 %
Lymphocytes Relative: 33 %
Lymphs Abs: 1.6 10*3/uL (ref 0.7–4.0)
MCH: 25.3 pg — ABNORMAL LOW (ref 26.0–34.0)
MCHC: 29.9 g/dL — ABNORMAL LOW (ref 30.0–36.0)
MCV: 84.6 fL (ref 80.0–100.0)
Monocytes Absolute: 0.4 10*3/uL (ref 0.1–1.0)
Monocytes Relative: 9 %
Neutro Abs: 2.6 10*3/uL (ref 1.7–7.7)
Neutrophils Relative %: 56 %
Platelets: 252 10*3/uL (ref 150–400)
RBC: 3.71 MIL/uL — ABNORMAL LOW (ref 4.22–5.81)
RDW: 17.2 % — ABNORMAL HIGH (ref 11.5–15.5)
WBC: 4.7 10*3/uL (ref 4.0–10.5)
nRBC: 0 % (ref 0.0–0.2)

## 2020-11-08 LAB — COMPREHENSIVE METABOLIC PANEL
ALT: 12 U/L (ref 0–44)
AST: 14 U/L — ABNORMAL LOW (ref 15–41)
Albumin: 2.9 g/dL — ABNORMAL LOW (ref 3.5–5.0)
Alkaline Phosphatase: 145 U/L — ABNORMAL HIGH (ref 38–126)
Anion gap: 6 (ref 5–15)
BUN: 10 mg/dL (ref 8–23)
CO2: 33 mmol/L — ABNORMAL HIGH (ref 22–32)
Calcium: 10 mg/dL (ref 8.9–10.3)
Chloride: 103 mmol/L (ref 98–111)
Creatinine, Ser: 0.95 mg/dL (ref 0.61–1.24)
GFR, Estimated: >60 mL/min (ref >60)
Glucose, Bld: 116 mg/dL — ABNORMAL HIGH (ref 70–99)
Potassium: 3.4 mmol/L — ABNORMAL LOW (ref 3.5–5.1)
Sodium: 142 mmol/L (ref 135–145)
Total Bilirubin: 0.7 mg/dL (ref 0.3–1.2)
Total Protein: 6.4 g/dL — ABNORMAL LOW (ref 6.5–8.1)

## 2020-11-08 NOTE — ED Triage Notes (Signed)
Pt wants catherter out.  Penis pain.  States he went to a specialist, "Pills did not help."  Called family, poor historian.

## 2020-11-08 NOTE — ED Provider Notes (Signed)
Emergency Medicine Provider Triage Evaluation Note  KRISTINE TILEY , a 80 y.o. male  was evaluated in triage.  Pt complains of needing his foley catheter removed. He admits to pain in his penis.  Additional history provided by grand daughter [name]. He was seen in the ED recently in [place] and had catheter placed last month. She recently changed bag however is concerned catheter needs to be changed because it has been in for approximately 5 weeks. He is not on antibiotic currently.   Review of Systems  Positive: Abdominal pain, penile pain Negative: Fever, chills, nausea, emesis  Physical Exam  BP 116/69 (BP Location: Right Arm)   Pulse 83   Temp 99 F (37.2 C) (Oral)   Resp 17   SpO2 100%  Gen:   Awake, no distress   Resp:  Normal effort  MSK:   Moves extremities without difficulty  Other:  Right CVA tenderness. RLQ abdominal pain. Aphasic from prior stroke  Medical Decision Making  Medically screening exam initiated at 7:54 PM.  Appropriate orders placed.  Pearley L Lamm was informed that the remainder of the evaluation will be completed by another provider, this initial triage assessment does not replace that evaluation, and the importance of remaining in the ED until their evaluation is complete.  Basic labs ordered. I called patient's granddaughter and she will come to sit with patient and provide additional history when for next provider.   Portions of this note were generated with [employer]. Dictation errors may occur despite best attempts at proofreading.    Barrie Folk, PA-C 11/08/20 2006    [name], MD 11/09/20 1944

## 2020-11-09 LAB — URINALYSIS, ROUTINE W REFLEX MICROSCOPIC
Bilirubin Urine: NEGATIVE
Glucose, UA: NEGATIVE mg/dL
Ketones, ur: NEGATIVE mg/dL
Nitrite: NEGATIVE
Protein, ur: 100 mg/dL — AB
Specific Gravity, Urine: >1.03 — ABNORMAL HIGH (ref 1.005–1.030)
pH: 6 (ref 5.0–8.0)

## 2020-11-09 LAB — URINALYSIS, MICROSCOPIC (REFLEX)

## 2020-11-09 MED ORDER — NITROFURANTOIN MONOHYD MACRO 100 MG PO CAPS
100.0000 mg | ORAL_CAPSULE | Freq: Once | ORAL | Status: AC
  Administered 2020-11-09: 100 mg via ORAL
  Filled 2020-11-09: qty 1

## 2020-11-09 MED ORDER — SULFAMETHOXAZOLE-TRIMETHOPRIM 800-160 MG PO TABS: 1.0000 | ORAL_TABLET | Freq: Two times a day (BID) | ORAL | 0 refills | Status: AC

## 2020-11-09 MED ORDER — SULFAMETHOXAZOLE-TRIMETHOPRIM 800-160 MG PO TABS
1.0000 | ORAL_TABLET | Freq: Once | ORAL | Status: AC
  Administered 2020-11-09: 1 via ORAL
  Filled 2020-11-09: qty 1

## 2020-11-09 MED ORDER — NITROFURANTOIN MONOHYD MACRO 100 MG PO CAPS: 100.0000 mg | ORAL_CAPSULE | Freq: Two times a day (BID) | ORAL | 0 refills | Status: AC

## 2020-11-09 NOTE — Discharge Instructions (Signed)
I started Todd Dunn on two antibiotic pills that should treat the bacteria causing his last urine infection.  His urine culture will take a few days to result.  If it comes back negative, he can stop his antibiotics.  Please reach out to his doctor or urologist about these results in 2-3 days.  We changed his foley today with a 20 French foley.  This catheter should be changed every 4 weeks.  Please follow up with his urologist.

## 2020-11-09 NOTE — ED Provider Notes (Signed)
[facility] EMERGENCY DEPARTMENT Provider Note   CSN: [id number] Arrival date & time: 11/08/20  1805     History No chief complaint on file.   Todd Dunn is a 80 y.o. male w/ hx of BPH, chronic indwelling foley, urosepsis (May 2022) presenting to ED with pain near penis.  He is here with his granddaughter who helps care for him.  The patient has been complaining of pain near his penis meatus for several days.  She thinks his urine is cloudy.  She is concerned he may have an infection and that the catheter needs to be changed.  Per medical records he was admitted at OSH in May 2022 with a MDR UTI as noted below   Urine culture ID  >100,000 CFU/ML Escherichia coli ESBLAbnormal   Urine culture ID  >100,000 CFU/ML Proteus mirabilisAbnormal   Resulting Agency [facility]   Susceptibility  Organism Antibiotic Method Susceptibility  Escherichia coli ESBL Organism ID MICRO MIC SUSCEPTIBILITY   Escherichia coli ESBL Amoxicillin + K Clavulanate MICRO MIC SUSCEPTIBILITY <=8/4: Susceptible  Escherichia coli ESBL Ampicillin MICRO MIC SUSCEPTIBILITY >16: Resistant  Escherichia coli ESBL Ampicillin/Sulbactam MICRO MIC SUSCEPTIBILITY >16/8: Resistant  Escherichia coli ESBL Aztreonam MICRO MIC SUSCEPTIBILITY >16: Resistant  Escherichia coli ESBL Cefazolin MICRO MIC SUSCEPTIBILITY >16: Resistant  Escherichia coli ESBL Cefepime MICRO MIC SUSCEPTIBILITY >16: Resistant  Escherichia coli ESBL Cefotaxime MICRO MIC SUSCEPTIBILITY >32: Resistant  Escherichia coli ESBL Ceftriaxone MICRO MIC SUSCEPTIBILITY >32: Resistant  Escherichia coli ESBL Cefuroxime MICRO MIC SUSCEPTIBILITY >16: Resistant  Escherichia coli ESBL Ciprofloxacin MICRO MIC SUSCEPTIBILITY >2: Resistant  Escherichia coli ESBL Ertapenem MICRO MIC SUSCEPTIBILITY <=0.5: Susceptible  Escherichia coli ESBL Gentamicin MICRO MIC SUSCEPTIBILITY <=2: Susceptible  Escherichia coli ESBL Meropenem  MICRO MIC SUSCEPTIBILITY <=1: Susceptible  Escherichia coli ESBL Nitrofurantoin MICRO MIC SUSCEPTIBILITY <=32: Susceptible  Escherichia coli ESBL Piperacillin/Tazobactam MICRO MIC SUSCEPTIBILITY <=8: Susceptible  Escherichia coli ESBL Tetracycline MICRO MIC SUSCEPTIBILITY <=4: Susceptible  Escherichia coli ESBL Trimethoprim/Sulfamethoxazole MICRO MIC SUSCEPTIBILITY <=0.5/9.5: Susceptible  Comment: Identification testing performed by [employer] matrix-assisted laser desorption/ionization time of flight(MALDI-TOF)mass spectrometry.  Antimicrobial susceptibility testing performed by [employer] [place] [facility]  Proteus mirabilis Organism ID MICRO MIC SUSCEPTIBILITY   Proteus mirabilis Amoxicillin + K Clavulanate MICRO MIC SUSCEPTIBILITY <=8/4: Susceptible  Proteus mirabilis Ampicillin MICRO MIC SUSCEPTIBILITY <=8: Susceptible  Proteus mirabilis Ampicillin/Sulbactam MICRO MIC SUSCEPTIBILITY <=4/2: Susceptible  Proteus mirabilis Aztreonam MICRO MIC SUSCEPTIBILITY <=4: Susceptible  Proteus mirabilis Cefazolin MICRO MIC SUSCEPTIBILITY 4: Susceptible  Proteus mirabilis Cefotaxime MICRO MIC SUSCEPTIBILITY <=2: Susceptible  Proteus mirabilis Ceftriaxone MICRO MIC SUSCEPTIBILITY <=1: Susceptible  Proteus mirabilis Cefuroxime MICRO MIC SUSCEPTIBILITY <=4: Susceptible  Proteus mirabilis Ciprofloxacin MICRO MIC SUSCEPTIBILITY >2: Resistant  Proteus mirabilis Ertapenem MICRO MIC SUSCEPTIBILITY <=0.5: Susceptible  Proteus mirabilis Gentamicin MICRO MIC SUSCEPTIBILITY >8: Resistant  Proteus mirabilis Meropenem MICRO MIC SUSCEPTIBILITY <=1: Susceptible  Proteus mirabilis Nitrofurantoin MICRO MIC SUSCEPTIBILITY >64: Resistant  Proteus mirabilis Piperacillin/Tazobactam MICRO MIC SUSCEPTIBILITY <=8: Susceptible  Proteus mirabilis Tetracycline MICRO MIC SUSCEPTIBILITY >8: Resistant  Proteus mirabilis Tobramycin MICRO MIC SUSCEPTIBILITY 4: Susceptible  Proteus mirabilis Trimethoprim/Sulfamethoxazole MICRO  MIC SUSCEPTIBILITY >2/38: Resistant     HPI     No past medical history on file.  There are no problems to display for this patient.    No family history on file.     Home Medications Prior to Admission medications   Medication Sig Start Date End Date Taking? Authorizing Provider  nitrofurantoin, macrocrystal-monohydrate, (MACROBID) 100 MG  capsule Take 1 capsule (100 mg total) by mouth 2 (two) times daily for 7 days. 11/09/20 11/16/20 Yes Doloros Kwolek, Carola Rhine, MD  sulfamethoxazole-trimethoprim (BACTRIM DS) 800-160 MG tablet Take 1 tablet by mouth 2 (two) times daily for 10 days. 11/09/20 11/19/20 Yes Shayne Deerman, Carola Rhine, MD  acetaminophen (TYLENOL) 325 MG tablet Take 650 mg by mouth every 4 (four) hours as needed for mild pain.    [provider]  amLODipine (NORVASC) 10 MG tablet Take 10 mg by mouth daily.    [provider]  citalopram (CELEXA) 20 MG tablet Take 20 mg by mouth at bedtime.    [provider]  clopidogrel (PLAVIX) 75 MG tablet Take 75 mg by mouth at bedtime.    [provider]  finasteride (PROSCAR) 5 MG tablet Take 5 mg by mouth daily.    [provider]  furosemide (LASIX) 20 MG tablet Take 20 mg by mouth daily.    [provider]  hydrALAZINE (APRESOLINE) 50 MG tablet Take 50 mg by mouth 3 (three) times daily.    [provider]  isosorbide mononitrate (IMDUR) 60 MG 24 hr tablet Take 60 mg by mouth daily.    [provider]  lisinopril (ZESTRIL) 40 MG tablet Take 40 mg by mouth daily.    [provider]  magnesium hydroxide (MILK OF MAGNESIA) 400 MG/5ML suspension Take 30 mLs by mouth See admin instructions. Give 27ml by mouth every 72 hours as needed for constipation.    [provider]  nitrofurantoin (MACRODANTIN) 50 MG capsule Take 50 mg by mouth at bedtime.    [provider]  nitroGLYCERIN (NITROSTAT) 0.4 MG SL tablet Place 0.4 mg under the tongue every 5 (five) minutes  as needed for chest pain.    [provider]  polyethylene glycol (MIRALAX / GLYCOLAX) 17 g packet Take 17 g by mouth daily.    [provider]  promethazine (PHENERGAN) 25 MG/ML injection Inject 12.5 mg into the muscle every 6 (six) hours as needed for nausea or vomiting.    [provider]  rosuvastatin (CRESTOR) 10 MG tablet Take 10 mg by mouth at bedtime.    [provider]  tamsulosin (FLOMAX) 0.4 MG CAPS capsule Take 0.4 mg by mouth daily.    [provider]  Vitamin D, Ergocalciferol, (DRISDOL) 1.25 MG (50000 UNIT) CAPS capsule Take 50,000 Units by mouth every 7 (seven) days.    [provider]    Allergies    Penicillins  Review of Systems   Review of Systems  Constitutional: Negative for chills and fever.  Cardiovascular: Negative for chest pain and palpitations.  Gastrointestinal: Negative for abdominal pain and vomiting.  Genitourinary: Positive for dysuria and penile pain. Negative for penile swelling.  Musculoskeletal: Negative for arthralgias and back pain.  Skin: Negative for color change and rash.  Neurological: Negative for seizures and syncope.  All other systems reviewed and are negative.   Physical Exam Updated Vital Signs BP (!) 168/74 (BP Location: Right Arm)   Pulse (!) 117   Temp 98.2 F (36.8 C) (Oral)   Resp 18   Ht 6\' 1"  (1.854 m)   Wt 56.7 kg   SpO2 100%   BMI 16.49 kg/m   Physical Exam Constitutional:      General: He is not in acute distress. HENT:     Head: Normocephalic and atraumatic.  Eyes:     Conjunctiva/sclera: Conjunctivae normal.     Pupils: Pupils are equal,  round, and reactive to light.  Cardiovascular:     Rate and Rhythm: Normal rate and regular rhythm.  Pulmonary:     Effort: Pulmonary effort is normal. No respiratory distress.  Abdominal:     General: There is no distension.     Tenderness: There is no abdominal tenderness.  Genitourinary:    Comments: Foley in place,  uncircumsized penis, no drainage around insertion site Urine cloudy Skin:    General: Skin is warm and dry.  Neurological:     General: No focal deficit present.     Mental Status: He is alert. Mental status is at baseline.     ED Results / Procedures / Treatments   Labs (all labs ordered are listed, but only abnormal results are displayed) Labs Reviewed  COMPREHENSIVE METABOLIC PANEL - Abnormal; Notable for the following components:      Result Value   Potassium 3.4 (*)    CO2 33 (*)    Glucose, Bld 116 (*)    Total Protein 6.4 (*)    Albumin 2.9 (*)    AST 14 (*)    Alkaline Phosphatase 145 (*)    All other components within normal limits  CBC WITH DIFFERENTIAL/PLATELET - Abnormal; Notable for the following components:   RBC 3.71 (*)    Hemoglobin 9.4 (*)    HCT 31.4 (*)    MCH 25.3 (*)    MCHC 29.9 (*)    RDW 17.2 (*)    All other components within normal limits  URINALYSIS, ROUTINE W REFLEX MICROSCOPIC - Abnormal; Notable for the following components:   APPearance CLOUDY (*)    Specific Gravity, Urine >1.030 (*)    Hgb urine dipstick LARGE (*)    Protein, ur 100 (*)    Leukocytes,Ua SMALL (*)    All other components within normal limits  URINALYSIS, MICROSCOPIC (REFLEX) - Abnormal; Notable for the following components:   Bacteria, UA MANY (*)    All other components within normal limits  URINE CULTURE    EKG None  Radiology No results found.  Procedures Procedures   Medications Ordered in ED Medications  nitrofurantoin (macrocrystal-monohydrate) (MACROBID) capsule 100 mg (100 mg Oral Given 11/09/20 0504)  sulfamethoxazole-trimethoprim (BACTRIM DS) 800-160 MG per tablet 1 tablet (1 tablet Oral Given 11/09/20 1517)    ED Course  I have reviewed the triage vital signs and the nursing notes.  Pertinent labs & imaging results that were available during my care of the patient were reviewed by me and considered in my medical decision making (see chart for  details).  80 yo male here with dysuria, cloudy urine through foley, pain near meatus.  Hx of UTI's.  We exchanged his foley with a 15 [place] today and sent off a new culture.  Until we have results, I've opted to start him on macrobid + bactrim based on cross-coverage from his last urine cultures on 10/2020 on [facility] Everywhere.    His WBC is normal - no signs of sepsis at this time. His labs otherwise look ok.  No AKI.  Granddaughter present for additional history Prior medical records reviewed Okay for discharge   Final Clinical Impression(s) / ED Diagnoses Final diagnoses:  Cloudy urine  Complication of Foley catheter, initial encounter ([place])    Rx / DC Orders ED Discharge Orders         Ordered    nitrofurantoin, macrocrystal-monohydrate, (MACROBID) 100 MG capsule  2 times daily        11/09/20  0542    sulfamethoxazole-trimethoprim (BACTRIM DS) 800-160 MG tablet  2 times daily        11/09/20 0542           Wyvonnia Dusky, MD 11/09/20 [phone]

## 2020-11-09 NOTE — ED Notes (Signed)
Todd Dunn [phone]

## 2020-11-11 ENCOUNTER — Other Ambulatory Visit: Payer: Self-pay

## 2020-11-11 ENCOUNTER — Emergency Department (HOSPITAL_COMMUNITY): Payer: Medicare (Managed Care)

## 2020-11-11 ENCOUNTER — Emergency Department (HOSPITAL_COMMUNITY)
Admission: EM | Admit: 2020-11-11 | Discharge: 2020-11-11 | Disposition: A | Discharge status: Home / Self Care | Payer: Medicare (Managed Care) | Attending: Emergency Medicine | Admitting: Emergency Medicine

## 2020-11-11 DIAGNOSIS — R1032 Left lower quadrant pain: Secondary | ICD-10-CM | POA: Insufficient documentation

## 2020-11-11 DIAGNOSIS — R479 Unspecified speech disturbances: Secondary | ICD-10-CM | POA: Insufficient documentation

## 2020-11-11 DIAGNOSIS — R079 Chest pain, unspecified: Secondary | ICD-10-CM | POA: Diagnosis not present

## 2020-11-11 DIAGNOSIS — R109 Unspecified abdominal pain: Secondary | ICD-10-CM

## 2020-11-11 DIAGNOSIS — R531 Weakness: Secondary | ICD-10-CM | POA: Insufficient documentation

## 2020-11-11 LAB — URINE CULTURE: Culture: 60000 — AB

## 2020-11-11 LAB — HEPATIC FUNCTION PANEL
ALT: 11 U/L (ref 0–44)
AST: 16 U/L (ref 15–41)
Albumin: 2.8 g/dL — ABNORMAL LOW (ref 3.5–5.0)
Alkaline Phosphatase: 129 U/L — ABNORMAL HIGH (ref 38–126)
Bilirubin, Direct: 0.1 mg/dL (ref 0.0–0.2)
Indirect Bilirubin: 0.2 mg/dL — ABNORMAL LOW (ref 0.3–0.9)
Total Bilirubin: 0.3 mg/dL (ref 0.3–1.2)
Total Protein: 6.3 g/dL — ABNORMAL LOW (ref 6.5–8.1)

## 2020-11-11 LAB — BASIC METABOLIC PANEL
Anion gap: 7 (ref 5–15)
BUN: 20 mg/dL (ref 8–23)
CO2: 24 mmol/L (ref 22–32)
Calcium: 9.4 mg/dL (ref 8.9–10.3)
Chloride: 106 mmol/L (ref 98–111)
Creatinine, Ser: 0.84 mg/dL (ref 0.61–1.24)
GFR, Estimated: >60 mL/min (ref >60)
Glucose, Bld: 111 mg/dL — ABNORMAL HIGH (ref 70–99)
Potassium: 3.8 mmol/L (ref 3.5–5.1)
Sodium: 137 mmol/L (ref 135–145)

## 2020-11-11 LAB — CBC
HCT: 31 % — ABNORMAL LOW (ref 39.0–52.0)
Hemoglobin: 9.2 g/dL — ABNORMAL LOW (ref 13.0–17.0)
MCH: 25.3 pg — ABNORMAL LOW (ref 26.0–34.0)
MCHC: 29.7 g/dL — ABNORMAL LOW (ref 30.0–36.0)
MCV: 85.2 fL (ref 80.0–100.0)
Platelets: 223 10*3/uL (ref 150–400)
RBC: 3.64 MIL/uL — ABNORMAL LOW (ref 4.22–5.81)
RDW: 17.1 % — ABNORMAL HIGH (ref 11.5–15.5)
WBC: 5.7 10*3/uL (ref 4.0–10.5)
nRBC: 0 % (ref 0.0–0.2)

## 2020-11-11 LAB — LIPASE, BLOOD: Lipase: 131 U/L — ABNORMAL HIGH (ref 11–51)

## 2020-11-11 LAB — TROPONIN I (HIGH SENSITIVITY)
Troponin I (High Sensitivity): 7 ng/L (ref <18)
Troponin I (High Sensitivity): 7 ng/L (ref <18)

## 2020-11-11 MED ORDER — FENTANYL CITRATE (PF) 100 MCG/2ML IJ SOLN
50.0000 ug | Freq: Once | INTRAMUSCULAR | Status: AC
  Administered 2020-11-11: 50 ug via INTRAVENOUS
  Filled 2020-11-11: qty 2

## 2020-11-11 MED ORDER — SODIUM CHLORIDE 0.9 % IV BOLUS
1000.0000 mL | Freq: Once | INTRAVENOUS | Status: AC
  Administered 2020-11-11: 1000 mL via INTRAVENOUS

## 2020-11-11 NOTE — ED Notes (Signed)
Patient transported to CT 

## 2020-11-11 NOTE — Discharge Instructions (Addendum)
Recommend Tylenol for pain.  Follow-up with primary care doctor.

## 2020-11-11 NOTE — ED Provider Notes (Signed)
Emergency Medicine Provider Triage Evaluation Note  Todd Dunn , a 80 y.o. male  was evaluated in triage.  Pt complains of chest pain and LLQ. Patient is a challenging historian. States chest pain started earlier today when they called EMS. It has since resolved into left sided abdominal pain. States it is constant.   Review of Systems  Positive: Chest pain, abdominal pain  Negative: Vomiting   Physical Exam  There were no vitals taken for this visit. Gen:   Awake, no distress   Resp:  Normal effort  MSK:   Moves extremities without difficulty  Other:  TTP to lower abdomen, not particularly localized   Medical Decision Making  Medically screening exam initiated at 11:26 AM.  Appropriate orders placed.  Osman L Galluzzo was informed that the remainder of the evaluation will be completed by another provider, this initial triage assessment does not replace that evaluation, and the importance of remaining in the ED until their evaluation is complete.    Sherrill Raring, PA-C 11/11/20 1127    Charlesetta Shanks, MD 11/18/20 2137

## 2020-11-11 NOTE — ED Triage Notes (Signed)
Pt via EMS after son reports pt began having chest pain while eating chicken liver today. On EMS arrival pt not having CP but having LLQ abdominal pain. Arrives to ED and is having L sided chest pain and left sided abdominal pain. 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

## 2020-11-11 NOTE — ED Notes (Signed)
Discharge instructions gone over with family at bedside. Dr. Ronnald Nian also spoke with family about discharge instructions.

## 2020-11-11 NOTE — ED Provider Notes (Signed)
[facility] EMERGENCY DEPARTMENT Provider Note   CSN: [id number] Arrival date & time: 11/11/20  1120     History Chief Complaint  Patient presents with  . Abdominal Pain    Todd Dunn is a 80 y.o. male.  History limited due to patient with history of stroke with left-sided weakness and difficulty with speech.  Caregiver states that patient supposedly was complaining of some left upper chest pain, possibly shoulder pain and left lower abdominal pain earlier today.  He recently had his Foley catheter changed 2 days ago.  He is on antibiotics for urine infection.  He has a cardiac history with stents in the past.  Sometimes per family it is hard to get history from him because he does not tell them if he is in discomfort very often.  Does not appear to have any new stroke symptoms.  Has not had any nausea or vomiting or diarrhea.  Foley catheter appears to have been working fine.  The history is provided by the patient and a caregiver.  Illness Location:  Chest, abdomen Quality:  Pain Severity:  Mild Onset quality:  Gradual Timing:  Constant Progression:  Unchanged Chronicity:  New Relieved by:  Nothing Worsened by:  Nothing Associated symptoms: abdominal pain and chest pain   Associated symptoms: no cough, no ear pain, no fever, no rash, no shortness of breath, no sore throat and no vomiting        No past medical history on file.  There are no problems to display for this patient.  No family history on file.     Home Medications Prior to Admission medications   Medication Sig Start Date End Date Taking? Authorizing Provider  acetaminophen (TYLENOL) 325 MG tablet Take 650 mg by mouth every 4 (four) hours as needed for mild pain.    [provider]  amLODipine (NORVASC) 10 MG tablet Take 10 mg by mouth daily.    [provider]  citalopram (CELEXA) 20 MG tablet Take 20 mg by mouth at bedtime.    [provider]   clopidogrel (PLAVIX) 75 MG tablet Take 75 mg by mouth at bedtime.    [provider]  finasteride (PROSCAR) 5 MG tablet Take 5 mg by mouth daily.    [provider]  furosemide (LASIX) 20 MG tablet Take 20 mg by mouth daily.    [provider]  hydrALAZINE (APRESOLINE) 50 MG tablet Take 50 mg by mouth 3 (three) times daily.    [provider]  isosorbide mononitrate (IMDUR) 60 MG 24 hr tablet Take 60 mg by mouth daily.    [provider]  lisinopril (ZESTRIL) 40 MG tablet Take 40 mg by mouth daily.    [provider]  magnesium hydroxide (MILK OF MAGNESIA) 400 MG/5ML suspension Take 30 mLs by mouth See admin instructions. Give 65ml by mouth every 72 hours as needed for constipation.    [provider]  nitrofurantoin (MACRODANTIN) 50 MG capsule Take 50 mg by mouth at bedtime.    [provider]  nitrofurantoin, macrocrystal-monohydrate, (MACROBID) 100 MG capsule Take 1 capsule (100 mg total) by mouth 2 (two) times daily for 7 days. 11/09/20 11/16/20  Wyvonnia Dusky, MD  nitroGLYCERIN (NITROSTAT) 0.4 MG SL tablet Place 0.4 mg under the tongue every 5 (five) minutes as needed for chest pain.    [provider]  polyethylene glycol (MIRALAX / GLYCOLAX) 17 g packet Take 17 g by mouth daily.  [provider]  promethazine (PHENERGAN) 25 MG/ML injection Inject 12.5 mg into the muscle every 6 (six) hours as needed for nausea or vomiting.    [provider]  rosuvastatin (CRESTOR) 10 MG tablet Take 10 mg by mouth at bedtime.    [provider]  sulfamethoxazole-trimethoprim (BACTRIM DS) 800-160 MG tablet Take 1 tablet by mouth 2 (two) times daily for 10 days. 11/09/20 11/19/20  Wyvonnia Dusky, MD  tamsulosin (FLOMAX) 0.4 MG CAPS capsule Take 0.4 mg by mouth daily.    [provider]  Vitamin D, Ergocalciferol, (DRISDOL) 1.25 MG (50000 UNIT) CAPS capsule Take 50,000 Units by mouth every 7  (seven) days.    [provider]    Allergies    Penicillins  Review of Systems   Review of Systems  Constitutional: Negative for chills and fever.  HENT: Negative for ear pain and sore throat.   Eyes: Negative for pain and visual disturbance.  Respiratory: Negative for cough and shortness of breath.   Cardiovascular: Positive for chest pain. Negative for palpitations.  Gastrointestinal: Positive for abdominal pain. Negative for vomiting.  Genitourinary: Negative for dysuria and hematuria.  Musculoskeletal: Negative for arthralgias and back pain.  Skin: Negative for color change and rash.  Neurological: Negative for seizures and syncope.  All other systems reviewed and are negative.   Physical Exam Updated Vital Signs BP 134/78   Pulse 73   Temp 97.7 F (36.5 C) (Oral)   Resp (!) 23   Ht 6\' 1"  (1.854 m)   Wt 56.7 kg   SpO2 100%   BMI 16.49 kg/m   Physical Exam Vitals and nursing note reviewed.  Constitutional:      General: He is not in acute distress.    Appearance: He is well-developed. He is not ill-appearing.  HENT:     Head: Normocephalic and atraumatic.     Mouth/Throat:     Mouth: Mucous membranes are moist.     Pharynx: Oropharynx is clear.  Eyes:     Extraocular Movements: Extraocular movements intact.     Conjunctiva/sclera: Conjunctivae normal.  Cardiovascular:     Rate and Rhythm: Normal rate and regular rhythm.     Heart sounds: Normal heart sounds. No murmur heard.   Pulmonary:     Effort: Pulmonary effort is normal. No respiratory distress.     Breath sounds: Normal breath sounds.  Abdominal:     General: There is no distension.     Palpations: Abdomen is soft.     Tenderness: There is abdominal tenderness in the suprapubic area and left lower quadrant.  Musculoskeletal:     Cervical back: Neck supple.  Skin:    General: Skin is warm and dry.  Neurological:     General: No focal deficit present.     Mental Status: He is alert  and oriented to person, place, and time.     Cranial Nerves: No cranial nerve deficit.     Comments: Some trace left-sided weakness but otherwise 5+ out of 5 strength throughout, normal sensation, slightly dysarthric speech  Psychiatric:        Mood and Affect: Mood normal.     ED Results / Procedures / Treatments   Labs (all labs ordered are listed, but only abnormal results are displayed) Labs Reviewed  BASIC METABOLIC PANEL - Abnormal; Notable for the following components:      Result Value   Glucose, Bld 111 (*)    All other components within normal  limits  CBC - Abnormal; Notable for the following components:   RBC 3.64 (*)    Hemoglobin 9.2 (*)    HCT 31.0 (*)    MCH 25.3 (*)    MCHC 29.7 (*)    RDW 17.1 (*)    All other components within normal limits  LIPASE, BLOOD - Abnormal; Notable for the following components:   Lipase 131 (*)    All other components within normal limits  HEPATIC FUNCTION PANEL - Abnormal; Notable for the following components:   Total Protein 6.3 (*)    Albumin 2.8 (*)    Alkaline Phosphatase 129 (*)    Indirect Bilirubin 0.2 (*)    All other components within normal limits  TROPONIN I (HIGH SENSITIVITY)  TROPONIN I (HIGH SENSITIVITY)    EKG EKG Interpretation  Date/Time:  Tuesday November 11 2020 11:24:19 EDT Ventricular Rate:  73 PR Interval:  126 QRS Duration: 110 QT Interval:  416 QTC Calculation: 458 R Axis:   10 Text Interpretation: Sinus rhythm with Premature atrial complexes Incomplete left bundle branch block Left ventricular hypertrophy with repolarization abnormality ( Cornell product ) Confirmed by Lennice Sites ([id number]) on 11/11/2020 6:43:30 PM   Radiology CT ABDOMEN PELVIS WO CONTRAST  Result Date: 11/11/2020 CLINICAL DATA:  Left lower quadrant abdominal pain EXAM: CT ABDOMEN AND PELVIS WITHOUT CONTRAST TECHNIQUE: Multidetector CT imaging of the abdomen and pelvis was performed following the standard protocol without IV contrast.  COMPARISON:  None. FINDINGS: Lower chest: Mild elevation of the left hemidiaphragm. Minimal right basilar atelectasis or infiltrate. No pleural effusion. Coronary artery bypass grafting has been performed. Cardiac size is within normal limits. Hepatobiliary: No focal liver abnormality is seen. No gallstones, gallbladder wall thickening, or biliary dilatation. Pancreas: Unremarkable Spleen: Unremarkable Adrenals/Urinary Tract: There is thickening of the left adrenal gland, however, discrete nodules not clearly identified. The right adrenal gland is unremarkable. Vascular calcifications are seen within the renal hila bilaterally. The kidneys are otherwise unremarkable. Foley catheter balloon is seen within a decompressed bladder lumen. There is marked circumferential thickening of the bladder wall which is nonspecific in the setting of chronic catheterization. Stomach/Bowel: High attenuation intraluminal contrast likely from prior fluoroscopic examination utilizing barium contrast is seen within the distal colon and rectal vault. The stomach, small bowel, and large bowel are unremarkable on this noncontrast examination though imaging is limited by lack of significant intra-abdominal fat and poor soft tissue resolution. No obstruction. No free intraperitoneal gas or fluid. Vascular/Lymphatic: Extensive aortoiliac atherosclerotic calcification. No aortic aneurysm. No pathologic adenopathy within the abdomen and pelvis. Reproductive: The prostate gland is moderately enlarged. Other: There is diffuse body wall wasting as well as subcutaneous edema in keeping with mild anasarca. No abdominal wall hernia. Rectum unremarkable. Musculoskeletal: Degenerative changes are seen within the lumbar spine. No acute bone abnormality. No lytic or blastic bone lesion. IMPRESSION: No definite acute intra-abdominal pathology identified. No definite radiographic explanation for the patient's reported left lower quadrant abdominal pain.  Circumferential thickening of the a bladder wall, nonspecific in the setting of chronic catheterization. The bladder is decompressed. Aortic Atherosclerosis (ICD10-I70.0). Electronically Signed   By: Fidela Salisbury MD   On: 11/11/2020 20:16   DG Chest 2 View  Result Date: 11/11/2020 CLINICAL DATA:  Chest pain EXAM: CHEST - 2 VIEW COMPARISON:  September 15, 2020 FINDINGS: The lungs are clear. The heart size and pulmonary vascularity are normal. No adenopathy. There is a pacemaker on the left with lead tips attached to the right  atrium and right ventricle. There is aortic atherosclerosis. No adenopathy. Patient is status post internal mammary bypass grafting. There is degenerative change in the thoracic spine. No pneumothorax. IMPRESSION: Lungs clear. Heart size normal. Postoperative changes. Pacemaker leads attached to right atrium and right ventricle. Aortic Atherosclerosis (ICD10-I70.0). Electronically Signed   By: Lowella Grip III M.D.   On: 11/11/2020 11:58    Procedures Procedures   Medications Ordered in ED Medications  fentaNYL (SUBLIMAZE) injection 50 mcg (50 mcg Intravenous Given 11/11/20 1938)  sodium chloride 0.9 % bolus 1,000 mL (1,000 mLs Intravenous New Bag/Given 11/11/20 1937)    ED Course  I have reviewed the triage vital signs and the nursing notes.  Pertinent labs & imaging results that were available during my care of the patient were reviewed by me and considered in my medical decision making (see chart for details).    MDM Rules/Calculators/A&P                          Todd Dunn is an 80 year old male with history of CAD, stroke with left-sided deficits, chronic indwelling Foley catheter who presents the ED with likely chest pain and abdominal pain.  Normal vitals.  No fever.  Patient has dysarthria at baseline and difficult to get a history.  Supposedly he was complaining of left upper chest pain and left lower abdominal pain earlier today.  May be has ongoing pain  now.  He may be tender in the lower abdomen on exam.  He had a Foley catheter replaced 2 days ago and overall appears to be draining urine.  He is on antibiotics for UTI but urine culture grew less than 60,000 of E. coli.  He does not have a fever.  He looks comfortable.  EKG shows sinus rhythm with some ST changes laterally that appear to be on prior EKGs.  Do not see any obvious ST elevation or new ischemic changes.  Will check troponins.  We will get basic labs otherwise and will get a CT scan of the abdomen and pelvis for further evaluation.  Troponin negative x2.  Doubt ACS.  No significant electrolyte abnormality, kidney injury, leukocytosis.  Gallbladder and liver enzymes within normal limits.  Lipase overall unremarkable.  CT scan of the abdomen and pelvis showed no pancreatitis, no kidney stone.  Overall unremarkable work-up.  Suspect musculoskeletal type pain.  Recommend Tylenol.  Discharged in good condition.  This chart was dictated using voice recognition software.  Despite best efforts to proofread,  errors can occur which can change the documentation meaning.    Final Clinical Impression(s) / ED Diagnoses Final diagnoses:  Abdominal pain, unspecified abdominal location    Rx / DC Orders ED Discharge Orders    None       Lennice Sites, DO 11/11/20 2207

## 2020-11-12 ENCOUNTER — Telehealth: Payer: Self-pay | Admitting: *Deleted

## 2020-11-12 NOTE — Progress Notes (Signed)
ED Antimicrobial Stewardship Positive Culture Follow Up   Todd Dunn is an 80 y.o. male who presented to [facility] on 11/11/2020 with a chief complaint of  Chief Complaint  Patient presents with  . Abdominal Pain    Recent Results (from the past 720 hour(s))  Urine culture     Status: Abnormal   Collection Time: 11/09/20  4:19 AM   Specimen: Urine, Random  Result Value Ref Range Status   Specimen Description URINE, RANDOM  Final   Special Requests   Final    NONE Performed at [facility], [address], [place], [place] [address]    Culture (A)  Final    60,000 COLONIES/mL ESCHERICHIA COLI Confirmed Extended Spectrum Beta-Lactamase Producer (ESBL).  In bloodstream infections from ESBL organisms, carbapenems are preferred over piperacillin/tazobactam. They are shown to have a lower risk of mortality.    Report Status 11/11/2020 FINAL  Final   Organism ID, Bacteria ESCHERICHIA COLI (A)  Final      Susceptibility   Escherichia coli - MIC*    AMPICILLIN >=32 RESISTANT Resistant     CEFAZOLIN >=64 RESISTANT Resistant     CEFEPIME >=32 RESISTANT Resistant     CEFTRIAXONE >=64 RESISTANT Resistant     CIPROFLOXACIN >=4 RESISTANT Resistant     GENTAMICIN <=1 SENSITIVE Sensitive     IMIPENEM <=0.25 SENSITIVE Sensitive     NITROFURANTOIN <=16 SENSITIVE Sensitive     TRIMETH/SULFA <=20 SENSITIVE Sensitive     AMPICILLIN/SULBACTAM >=32 RESISTANT Resistant     PIP/TAZO <=4 SENSITIVE Sensitive     * 60,000 COLONIES/mL ESCHERICHIA COLI    Upon review, it appears two antibiotics have been prescribed. EMR records suggest that only Bactrim has been filled by patient. Review of AVS is unclear of what initial plan was for patient upon discharge. Recommend, calling patient to ensure that at least bactrim has been picked up by patient and being taken.  ED Provider: Lucrezia Starch, PharmD, BCPS 11/12/2020 10:37 AM ED Clinical Pharmacist -  [phone]

## 2020-11-12 NOTE — Telephone Encounter (Signed)
Post ED Visit - Positive Culture Follow-up  Culture report reviewed by antimicrobial stewardship pharmacist: [facility] Team []  Elenor Quinones, Pharm.D. []  Heide Guile, Pharm.D., BCPS AQ-ID []  Parks Neptune, Pharm.D., BCPS []  Alycia Rossetti, Pharm.D., BCPS []  Clawson, Pharm.D., BCPS, AAHIVP []  Legrand Como, Pharm.D., BCPS, AAHIVP []  Salome Arnt, PharmD, BCPS []  Johnnette Gourd, PharmD, BCPS []  Hughes Better, PharmD, BCPS []  Leeroy Cha, PharmD []  Laqueta Linden, PharmD, BCPS []  Albertina Parr, PharmD  [place] Team []  Leodis Sias, PharmD []  Lindell Spar, PharmD []  Royetta Asal, PharmD []  Graylin Shiver, Rph []  Rema Fendt) Glennon Mac, PharmD []  Arlyn Dunning, PharmD []  Netta Cedars, PharmD []  Dia Sitter, PharmD []  Leone Haven, PharmD []  Gretta Arab, PharmD []  Theodis Shove, PharmD []  Peggyann Juba, PharmD []  Reuel Boom, PharmD   Positive urine culture Treated with Nitrofurantoin Monohyd Macro and Sulfamethoxazole-Trimethoprim.  Continued symptoms but has follow up with PCP today.  Advised to return to ED is necessary.  Harlon Flor [facility] 11/12/2020, 11:37 AM

## 2020-12-08 ENCOUNTER — Emergency Department (HOSPITAL_COMMUNITY): Payer: Medicare Other

## 2020-12-08 ENCOUNTER — Emergency Department (HOSPITAL_COMMUNITY)
Admission: EM | Admit: 2020-12-08 | Discharge: 2020-12-08 | Disposition: A | Discharge status: Home / Self Care | Payer: Medicare Other | Attending: Emergency Medicine | Admitting: Emergency Medicine

## 2020-12-08 DIAGNOSIS — M546 Pain in thoracic spine: Secondary | ICD-10-CM | POA: Insufficient documentation

## 2020-12-08 DIAGNOSIS — W19XXXA Unspecified fall, initial encounter: Secondary | ICD-10-CM

## 2020-12-08 DIAGNOSIS — Z7902 Long term (current) use of antithrombotics/antiplatelets: Secondary | ICD-10-CM | POA: Insufficient documentation

## 2020-12-08 DIAGNOSIS — S0093XA Contusion of unspecified part of head, initial encounter: Secondary | ICD-10-CM

## 2020-12-08 DIAGNOSIS — S0990XA Unspecified injury of head, initial encounter: Secondary | ICD-10-CM | POA: Diagnosis present

## 2020-12-08 DIAGNOSIS — T839XXA Unspecified complication of genitourinary prosthetic device, implant and graft, initial encounter: Secondary | ICD-10-CM

## 2020-12-08 DIAGNOSIS — Y846 Urinary catheterization as the cause of abnormal reaction of the patient, or of later complication, without mention of misadventure at the time of the procedure: Secondary | ICD-10-CM | POA: Diagnosis not present

## 2020-12-08 DIAGNOSIS — W01198A Fall on same level from slipping, tripping and stumbling with subsequent striking against other object, initial encounter: Secondary | ICD-10-CM | POA: Insufficient documentation

## 2020-12-08 DIAGNOSIS — T83091A Other mechanical complication of indwelling urethral catheter, initial encounter: Secondary | ICD-10-CM | POA: Insufficient documentation

## 2020-12-08 DIAGNOSIS — S0083XA Contusion of other part of head, initial encounter: Secondary | ICD-10-CM | POA: Insufficient documentation

## 2020-12-08 LAB — BASIC METABOLIC PANEL
Anion gap: 8 (ref 5–15)
BUN: 18 mg/dL (ref 8–23)
CO2: 29 mmol/L (ref 22–32)
Calcium: 10 mg/dL (ref 8.9–10.3)
Chloride: 104 mmol/L (ref 98–111)
Creatinine, Ser: 0.88 mg/dL (ref 0.61–1.24)
GFR, Estimated: >60 mL/min (ref >60)
Glucose, Bld: 109 mg/dL — ABNORMAL HIGH (ref 70–99)
Potassium: 3.6 mmol/L (ref 3.5–5.1)
Sodium: 141 mmol/L (ref 135–145)

## 2020-12-08 LAB — URINALYSIS, ROUTINE W REFLEX MICROSCOPIC
Bilirubin Urine: NEGATIVE
Glucose, UA: NEGATIVE mg/dL
Ketones, ur: NEGATIVE mg/dL
Nitrite: NEGATIVE
Protein, ur: NEGATIVE mg/dL
Specific Gravity, Urine: 1.009 (ref 1.005–1.030)
pH: 6 (ref 5.0–8.0)

## 2020-12-08 LAB — CBC WITH DIFFERENTIAL/PLATELET
Abs Immature Granulocytes: 0.04 10*3/uL (ref 0.00–0.07)
Basophils Absolute: 0 10*3/uL (ref 0.0–0.1)
Basophils Relative: 0 %
Eosinophils Absolute: 0 10*3/uL (ref 0.0–0.5)
Eosinophils Relative: 0 %
HCT: 30.2 % — ABNORMAL LOW (ref 39.0–52.0)
Hemoglobin: 9 g/dL — ABNORMAL LOW (ref 13.0–17.0)
Immature Granulocytes: 0 %
Lymphocytes Relative: 9 %
Lymphs Abs: 0.9 10*3/uL (ref 0.7–4.0)
MCH: 24.8 pg — ABNORMAL LOW (ref 26.0–34.0)
MCHC: 29.8 g/dL — ABNORMAL LOW (ref 30.0–36.0)
MCV: 83.2 fL (ref 80.0–100.0)
Monocytes Absolute: 0.5 10*3/uL (ref 0.1–1.0)
Monocytes Relative: 5 %
Neutro Abs: 8.7 10*3/uL — ABNORMAL HIGH (ref 1.7–7.7)
Neutrophils Relative %: 86 %
Platelets: 222 10*3/uL (ref 150–400)
RBC: 3.63 MIL/uL — ABNORMAL LOW (ref 4.22–5.81)
RDW: 16.7 % — ABNORMAL HIGH (ref 11.5–15.5)
WBC: 10.2 10*3/uL (ref 4.0–10.5)
nRBC: 0 % (ref 0.0–0.2)

## 2020-12-08 NOTE — ED Notes (Signed)
Pt in xray

## 2020-12-08 NOTE — ED Notes (Addendum)
No leg bag available. Out with standard foley bag

## 2020-12-08 NOTE — ED Notes (Signed)
Attempted to call emergency contact number and went straight to voicemail.

## 2020-12-08 NOTE — ED Notes (Signed)
EDP Dr. Langston Masker at [facility]. Pt to CT. Pt c/o bladder pain,  Bladder not draining. Bladder scan full, bag empty.

## 2020-12-08 NOTE — ED Triage Notes (Signed)
Patient states his family dropped him off and he fell this morning. Patient reports a burning pain to his privates. Patient difficult to understand and states pain is worse when sitting.

## 2020-12-08 NOTE — ED Provider Notes (Signed)
[facility] EMERGENCY DEPARTMENT Provider Note   CSN: [id number] Arrival date & time: 12/08/20  1244     History No chief complaint on file.   Todd Dunn is a 80 y.o. male.  HPI  79yoM pmhx stroke (chronic dysphagia, L-sided weakness, on Plavix), p/w fall and catheter issues. Pt was dropped off by family member, they have had multiple falls, most recently last night. Pt notes that he additionally fell backwards this morning, striking his head. No LOC w/ these episodes. Additionally notes dysuria and difficulty urinating after having large BM last night. Family concerned the stools may have blocked his catheter. Patient-provided history fairly limited 2/2 dysarthria. Collateral obtained from daughter [name] [phone]). No further medical concern at this time, including fevers, chills, cough, rhinorrhea, CP, SOB, NVD, hematuria, syncope, seizure.     History reviewed. No pertinent past medical history.  There are no problems to display for this patient.    No family history on file.     Home Medications Prior to Admission medications   Medication Sig Start Date End Date Taking? Authorizing Provider  acetaminophen (TYLENOL) 325 MG tablet Take 650 mg by mouth every 4 (four) hours as needed for mild pain.    [provider]  amLODipine (NORVASC) 10 MG tablet Take 10 mg by mouth daily.    [provider]  citalopram (CELEXA) 20 MG tablet Take 20 mg by mouth at bedtime.    [provider]  clopidogrel (PLAVIX) 75 MG tablet Take 75 mg by mouth at bedtime.    [provider]  finasteride (PROSCAR) 5 MG tablet Take 5 mg by mouth daily.    [provider]  furosemide (LASIX) 20 MG tablet Take 20 mg by mouth daily.    [provider]  hydrALAZINE (APRESOLINE) 50 MG tablet Take 50 mg by mouth 3 (three) times daily.    [provider]  isosorbide mononitrate (IMDUR) 60 MG 24 hr tablet Take 60  mg by mouth daily.    [provider]  lisinopril (ZESTRIL) 40 MG tablet Take 40 mg by mouth daily.    [provider]  magnesium hydroxide (MILK OF MAGNESIA) 400 MG/5ML suspension Take 30 mLs by mouth See admin instructions. Give 75ml by mouth every 72 hours as needed for constipation.    [provider]  nitrofurantoin (MACRODANTIN) 50 MG capsule Take 50 mg by mouth at bedtime.    [provider]  nitroGLYCERIN (NITROSTAT) 0.4 MG SL tablet Place 0.4 mg under the tongue every 5 (five) minutes as needed for chest pain.    [provider]  polyethylene glycol (MIRALAX / GLYCOLAX) 17 g packet Take 17 g by mouth daily.    [provider]  promethazine (PHENERGAN) 25 MG/ML injection Inject 12.5 mg into the muscle every 6 (six) hours as needed for nausea or vomiting.    [provider]  rosuvastatin (CRESTOR) 10 MG tablet Take 10 mg by mouth at bedtime.    [provider]  tamsulosin (FLOMAX) 0.4 MG CAPS capsule Take 0.4 mg by mouth daily.    [provider]  Vitamin D, Ergocalciferol, (DRISDOL) 1.25 MG (50000 UNIT) CAPS capsule Take 50,000 Units by mouth every 7 (seven) days.    [provider]    Allergies    Penicillins  Review of Systems   Review of Systems  Constitutional:  Negative for chills and fever.  HENT:  Negative for dental problem, ear pain, nosebleeds and  sore throat.   Eyes:  Negative for pain and visual disturbance.  Respiratory:  Negative for cough and shortness of breath.   Cardiovascular:  Negative for chest pain and leg swelling.  Gastrointestinal:  Negative for abdominal distention, nausea and vomiting.  Genitourinary:  Positive for difficulty urinating and dysuria. Negative for hematuria.       Suprapubic pain  Musculoskeletal:  Negative for arthralgias and back pain.  Skin:  Negative for color change and rash.  Neurological:  Positive for headaches. Negative for seizures and  syncope.  Psychiatric/Behavioral:  Negative for agitation and confusion.   All other systems reviewed and are negative.  Physical Exam Updated Vital Signs BP (!) 123/58 (BP Location: Right Arm)   Pulse 65   Temp 97.7 F (36.5 C) (Oral)   Resp 18   SpO2 98%   Physical Exam Vitals and nursing note reviewed.  Constitutional:      General: He is not in acute distress.    Appearance: He is not toxic-appearing.  HENT:     Head: Normocephalic.     Comments: 3x3cm hematoma over L eyebrow. TTP over R temporal region w/o underlying deformity or crepitus. No battle sign, periorbital ecchymosis, blood in nares/EAC's bilaterally.    Right Ear: External ear normal.     Left Ear: External ear normal.     Nose: Nose normal.     Mouth/Throat:     Mouth: Mucous membranes are moist.     Pharynx: Oropharynx is clear.  Eyes:     Extraocular Movements: Extraocular movements intact.     Conjunctiva/sclera: Conjunctivae normal.     Pupils: Pupils are equal, round, and reactive to light.     Comments: Pupils 42mm bilaterally  Cardiovascular:     Rate and Rhythm: Normal rate and regular rhythm.     Heart sounds: No murmur heard.   No friction rub. No gallop.  Pulmonary:     Effort: Pulmonary effort is normal.     Breath sounds: No stridor. No wheezing, rhonchi or rales.  Abdominal:     General: There is no distension.     Palpations: Abdomen is soft.     Tenderness: There is abdominal tenderness (suprapubic). There is no right CVA tenderness, left CVA tenderness, guarding or rebound.     Comments: Some tenderness w/ applying pressure to suprapubic region w/ straw-colored urine draining into foley  Musculoskeletal:     Cervical back: Normal range of motion. No tenderness.     Right lower leg: No edema.     Left lower leg: No edema.     Comments: Midline T-spine TTP w/o stepoff. Otherwise, no TTP/ecchymosis/deformity/crepitus to bilateral clavicles, all extremities, chest, pelvis; chest and pelvis  stable to AP/lateral compression; all extremities NVI distally in context of L hemiplegia; no CTL-spine deformity/TTP/stepoff.   Lymphadenopathy:     Cervical: No cervical adenopathy.  Skin:    General: Skin is warm.     Capillary Refill: Capillary refill takes less than 2 seconds.  Neurological:     Mental Status: He is alert. Mental status is at baseline.     Comments: Mental status: alert, grossly oriented Speech: baseline dysarthria CN II: visual fields grossly intact CN III/IV/VI: PERRL, EOMI CN V: facial sensation to LT and mastication intact CN VII: no facial droop CN VIII: no nystagmus, audition intact to finger rub VN IX/X: swallow intact CN XI: trapezius and SCM motor function intact CN XII: midline tongue w/o atrophy or fasciculation RUE: 5/5 strength,  sensation to LT intact LUE: spastic paralysis RLE: 5/5 strength, sensation to LT intact LLE: spastic paralysis Coordination: grossly intact Gait: not tested   Psychiatric:        Mood and Affect: Mood normal.        Behavior: Behavior normal.    ED Results / Procedures / Treatments   Labs (all labs ordered are listed, but only abnormal results are displayed) Labs Reviewed  BASIC METABOLIC PANEL - Abnormal; Notable for the following components:      Result Value   Glucose, Bld 109 (*)    All other components within normal limits  CBC WITH DIFFERENTIAL/PLATELET - Abnormal; Notable for the following components:   RBC 3.63 (*)    Hemoglobin 9.0 (*)    HCT 30.2 (*)    MCH 24.8 (*)    MCHC 29.8 (*)    RDW 16.7 (*)    Neutro Abs 8.7 (*)    All other components within normal limits  URINALYSIS, ROUTINE W REFLEX MICROSCOPIC - Abnormal; Notable for the following components:   Hgb urine dipstick MODERATE (*)    Leukocytes,Ua SMALL (*)    Bacteria, UA RARE (*)    All other components within normal limits  URINE CULTURE    EKG None  Radiology DG Thoracic Spine 2 View  Result Date: 12/08/2020 CLINICAL DATA:   Fall with middle spine pain EXAM: THORACIC SPINE 2 VIEWS COMPARISON:  Chest x-ray 09/15/2020 FINDINGS: Post sternotomy changes and cardiac pacing device. Vertebral body heights are maintained. Diffuse degenerative changes. IMPRESSION: Degenerative changes.  No acute osseous abnormality Electronically Signed   By: Donavan Foil M.D.   On: 12/08/2020 16:57   CT Head Wo Contrast  Result Date: 12/08/2020 CLINICAL DATA:  Head and neck trauma secondary to a fall. EXAM: CT HEAD WITHOUT CONTRAST CT CERVICAL SPINE WITHOUT CONTRAST TECHNIQUE: Multidetector CT imaging of the head and cervical spine was performed following the standard protocol without intravenous contrast. Multiplanar CT image reconstructions of the cervical spine were also generated. COMPARISON:  09/15/2020 FINDINGS: CT HEAD FINDINGS Brain: There is no evidence for acute hemorrhage, hydrocephalus, mass lesion, or abnormal extra-axial fluid collection. No definite CT evidence for acute infarction. Diffuse loss of parenchymal volume is consistent with atrophy. Patchy low attenuation in the deep hemispheric and periventricular white matter is nonspecific, but likely reflects chronic microvascular ischemic demyelination. Vascular: No hyperdense vessel or unexpected calcification. Skull: No evidence for fracture. No worrisome lytic or sclerotic lesion. Sinuses/Orbits: The visualized paranasal sinuses and mastoid air cells are clear. Visualized portions of the globes and intraorbital fat are unremarkable. Other: Small scalp lipoma noted left forehead. CT CERVICAL SPINE FINDINGS Alignment: Straightening of normal cervical lordosis. No subluxation. Skull base and vertebrae: No acute fracture. No primary bone lesion or focal pathologic process. Soft tissues and spinal canal: No prevertebral fluid or swelling. No visible canal hematoma. Disc levels: Diffuse loss of intervertebral disc height with endplate degeneration noted in the cervical spine, similar to prior.  Upper chest: Centrilobular emphysema noted in lung apices. Other: None. IMPRESSION: 1. No acute intracranial abnormality. Atrophy with chronic small vessel white matter ischemic disease. 2. Degenerative changes in the cervical spine without fracture. 3. Loss of cervical lordosis. This can be related to patient positioning, muscle spasm or soft tissue injury. Electronically Signed   By: Misty Stanley M.D.   On: 12/08/2020 15:04   CT Cervical Spine Wo Contrast  Result Date: 12/08/2020 CLINICAL DATA:  Head and neck trauma secondary to a  fall. EXAM: CT HEAD WITHOUT CONTRAST CT CERVICAL SPINE WITHOUT CONTRAST TECHNIQUE: Multidetector CT imaging of the head and cervical spine was performed following the standard protocol without intravenous contrast. Multiplanar CT image reconstructions of the cervical spine were also generated. COMPARISON:  09/15/2020 FINDINGS: CT HEAD FINDINGS Brain: There is no evidence for acute hemorrhage, hydrocephalus, mass lesion, or abnormal extra-axial fluid collection. No definite CT evidence for acute infarction. Diffuse loss of parenchymal volume is consistent with atrophy. Patchy low attenuation in the deep hemispheric and periventricular white matter is nonspecific, but likely reflects chronic microvascular ischemic demyelination. Vascular: No hyperdense vessel or unexpected calcification. Skull: No evidence for fracture. No worrisome lytic or sclerotic lesion. Sinuses/Orbits: The visualized paranasal sinuses and mastoid air cells are clear. Visualized portions of the globes and intraorbital fat are unremarkable. Other: Small scalp lipoma noted left forehead. CT CERVICAL SPINE FINDINGS Alignment: Straightening of normal cervical lordosis. No subluxation. Skull base and vertebrae: No acute fracture. No primary bone lesion or focal pathologic process. Soft tissues and spinal canal: No prevertebral fluid or swelling. No visible canal hematoma. Disc levels: Diffuse loss of intervertebral  disc height with endplate degeneration noted in the cervical spine, similar to prior. Upper chest: Centrilobular emphysema noted in lung apices. Other: None. IMPRESSION: 1. No acute intracranial abnormality. Atrophy with chronic small vessel white matter ischemic disease. 2. Degenerative changes in the cervical spine without fracture. 3. Loss of cervical lordosis. This can be related to patient positioning, muscle spasm or soft tissue injury. Electronically Signed   By: Misty Stanley M.D.   On: 12/08/2020 15:04    Procedures Procedures   Medications Ordered in ED Medications - No data to display  ED Course  I have reviewed the triage vital signs and the nursing notes.  Pertinent labs & imaging results that were available during my care of the patient were reviewed by me and considered in my medical decision making (see chart for details).    MDM Rules/Calculators/A&P                          This is an 79yoM w/ hx and pe as above. 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 Additionally family concerned for Foley catheter obstruction w/ dysuria, difficulty urinating, suprapubic pain. On exam, hematoma over L eyebrow and R temporal TTP w/o underlying deformity or crepitus. Mild suprapubic TTP w/ straw-colored urine draining into foley w/ pressure.  Initial interventions: Foley switched out w/ good flow of straw-colored urine  Ddx included: stroke, ICH, fx, dislocation, contusion, wound, abrasion, neurovascular injury, septal hematoma, dental trauma, obstructed foley, bladder mass, UTI  All studies independently reviewed by myself, d/w the attending physician, factored into my mdm. -ua: moderate blood, small amt wbcs -unremarkable: CT head/c-spine, XR t-spine, bmp, cbcd  Presentation appears most c/w head contusion s/p fall and obstructed foley, improved s/p switch out. No new neuro defects or imaging to findings to suggest stroke or ICH.  Head-to-toe trauma exam, neuro exam, and imaging reassuring against further injury. No septal hematoma or dental trauma on exam. Improvement s/p exchange of foley and reassuring UA, less likely bladder mass or UTI.  Therefore, feel pt is stable for DC home w/ close outpt f/u. Return precautions discussed. Pt and granddaughter understand and agree.  Pt HDS on reevaluation, subsequently discharged.   Final Clinical Impression(s) / ED Diagnoses Final diagnoses:  Foley catheter problem, initial encounter ([place])  Thoracic back pain, unspecified back pain laterality, unspecified chronicity  Fall, initial encounter  Contusion of head, unspecified part of head, initial encounter    Rx / DC Orders ED Discharge Orders     None        Levin Bacon, MD 12/09/20 5188    Sherwood Gambler, MD 12/09/20 [phone]

## 2020-12-08 NOTE — Discharge Instructions (Addendum)
Mr. Llera was evaluated after his fall. We did not find any new neurologic deficits on our exam. The scans of his head and neck were normal. He did have some back pain, but his X-ray's were normal. We also switched out his Foley with good passage of urine. Our urine studies showed no evidence of infection. Please follow up with his PCP in the next couple days. Please return to ED for any worsening.

## 2020-12-08 NOTE — ED Provider Notes (Signed)
Emergency Medicine Provider Triage Evaluation Note  Todd Dunn , a 80 y.o. male  was evaluated in triage.  Pt complains of fall and catheter problem.  Patient with history of prior stroke, chronic dysphagia and left-sided weakness, very limited history from patient.  Per family member that dropped patient off patient has had multiple falls they report most recently last night but patient also reports that he fell backwards this morning striking his head.  They also report he had a large bowel movement last night that they are concerned contaminated and may have blocked his catheter because he is complaining of pain and difficulty urinating.  Patient is on Plavix, no other anticoagulation.  Review of Systems  Positive: Head injury, catheter pain Negative: Fever  Physical Exam  BP (!) 146/63 (BP Location: Left Arm)   Pulse 100   Temp 98.1 F (36.7 C) (Oral)   Resp 14   SpO2 96%  Gen:   Awake, no distress   Resp:  Normal effort  MSK:   Moves extremities without difficulty  Other:  Palpable hematoma on the back of head  Medical Decision Making  Medically screening exam initiated at 1:48 PM.  Appropriate orders placed.  Todd Dunn was informed that the remainder of the evaluation will be completed by another provider, this initial triage assessment does not replace that evaluation, and the importance of remaining in the ED until their evaluation is complete.  Patient has had a fall on Plavix, needs acute bed for immediate evaluation, will be taken directly back to bed [address]   Jacqlyn Larsen, [place] 12/08/20 1353    Varney Biles, MD 12/09/20 [phone]

## 2020-12-08 NOTE — ED Notes (Signed)
Leg bag ordered

## 2020-12-08 NOTE — ED Notes (Signed)
Alert, NAD, calm, interactive, cooperative, not confused.

## 2020-12-08 NOTE — ED Notes (Signed)
Dr. Regenia Skeeter into room

## 2020-12-09 ENCOUNTER — Encounter (HOSPITAL_COMMUNITY): Payer: Self-pay | Admitting: Emergency Medicine

## 2020-12-11 LAB — URINE CULTURE: Culture: >=100000 — AB

## 2020-12-12 ENCOUNTER — Telehealth: Payer: Self-pay | Admitting: Emergency Medicine

## 2020-12-12 NOTE — Telephone Encounter (Signed)
Post ED Visit - Positive Culture Follow-up  Culture report reviewed by antimicrobial stewardship pharmacist: [place] Team []  Elenor Quinones, Pharm.D. []  Heide Guile, Pharm.D., BCPS AQ-ID []  Parks Neptune, Pharm.D., BCPS []  Alycia Rossetti, Pharm.D., BCPS []  [place], Pharm.D., BCPS, AAHIVP []  Legrand Como, Pharm.D., BCPS, AAHIVP []  Salome Arnt, PharmD, BCPS []  Johnnette Gourd, PharmD, BCPS []  Hughes Better, PharmD, BCPS [x]  Joetta Manners, PharmD []  Laqueta Linden, PharmD, BCPS []  Albertina Parr, PharmD  [place] Team []  Leodis Sias, PharmD []  Lindell Spar, PharmD []  Royetta Asal, PharmD []  Graylin Shiver, Rph []  Rema Fendt) Glennon Mac, PharmD []  Arlyn Dunning, PharmD []  Netta Cedars, PharmD []  Dia Sitter, PharmD []  Leone Haven, PharmD []  Gretta Arab, PharmD []  Theodis Shove, PharmD []  Peggyann Juba, PharmD []  Reuel Boom, PharmD   Positive urine culture No further patient follow-up is required at this time.  Sandi Raveling Kilian Schwartz 12/12/2020, 10:17 AM

## 2020-12-14 ENCOUNTER — Other Ambulatory Visit: Payer: Self-pay

## 2020-12-14 ENCOUNTER — Inpatient Hospital Stay (HOSPITAL_COMMUNITY)
Admission: EM | Admit: 2020-12-14 | Discharge: 2020-12-19 | DRG: 698 | Disposition: A | Discharge status: Home Health | Payer: Medicare Other | Attending: Internal Medicine | Admitting: Internal Medicine

## 2020-12-14 DIAGNOSIS — I10 Essential (primary) hypertension: Secondary | ICD-10-CM | POA: Diagnosis present

## 2020-12-14 DIAGNOSIS — N319 Neuromuscular dysfunction of bladder, unspecified: Secondary | ICD-10-CM | POA: Diagnosis present

## 2020-12-14 DIAGNOSIS — Y846 Urinary catheterization as the cause of abnormal reaction of the patient, or of later complication, without mention of misadventure at the time of the procedure: Secondary | ICD-10-CM | POA: Diagnosis present

## 2020-12-14 DIAGNOSIS — R739 Hyperglycemia, unspecified: Secondary | ICD-10-CM | POA: Diagnosis present

## 2020-12-14 DIAGNOSIS — I495 Sick sinus syndrome: Secondary | ICD-10-CM | POA: Diagnosis present

## 2020-12-14 DIAGNOSIS — N390 Urinary tract infection, site not specified: Secondary | ICD-10-CM | POA: Diagnosis not present

## 2020-12-14 DIAGNOSIS — Z880 Allergy status to penicillin: Secondary | ICD-10-CM

## 2020-12-14 DIAGNOSIS — T83518A Infection and inflammatory reaction due to other urinary catheter, initial encounter: Principal | ICD-10-CM | POA: Diagnosis present

## 2020-12-14 DIAGNOSIS — Z20822 Contact with and (suspected) exposure to covid-19: Secondary | ICD-10-CM | POA: Diagnosis present

## 2020-12-14 DIAGNOSIS — I69322 Dysarthria following cerebral infarction: Secondary | ICD-10-CM

## 2020-12-14 DIAGNOSIS — Z87440 Personal history of urinary (tract) infections: Secondary | ICD-10-CM

## 2020-12-14 DIAGNOSIS — I2510 Atherosclerotic heart disease of native coronary artery without angina pectoris: Secondary | ICD-10-CM | POA: Diagnosis present

## 2020-12-14 DIAGNOSIS — N400 Enlarged prostate without lower urinary tract symptoms: Secondary | ICD-10-CM | POA: Diagnosis present

## 2020-12-14 DIAGNOSIS — G928 Other toxic encephalopathy: Secondary | ICD-10-CM | POA: Diagnosis present

## 2020-12-14 DIAGNOSIS — R197 Diarrhea, unspecified: Secondary | ICD-10-CM | POA: Diagnosis present

## 2020-12-14 DIAGNOSIS — D649 Anemia, unspecified: Secondary | ICD-10-CM | POA: Diagnosis present

## 2020-12-14 DIAGNOSIS — Z950 Presence of cardiac pacemaker: Secondary | ICD-10-CM

## 2020-12-14 DIAGNOSIS — R4182 Altered mental status, unspecified: Secondary | ICD-10-CM

## 2020-12-14 DIAGNOSIS — R1310 Dysphagia, unspecified: Secondary | ICD-10-CM | POA: Diagnosis present

## 2020-12-14 DIAGNOSIS — Z87891 Personal history of nicotine dependence: Secondary | ICD-10-CM

## 2020-12-14 DIAGNOSIS — Z888 Allergy status to other drugs, medicaments and biological substances status: Secondary | ICD-10-CM

## 2020-12-14 DIAGNOSIS — Z7902 Long term (current) use of antithrombotics/antiplatelets: Secondary | ICD-10-CM

## 2020-12-14 DIAGNOSIS — Z79899 Other long term (current) drug therapy: Secondary | ICD-10-CM

## 2020-12-14 DIAGNOSIS — I69354 Hemiplegia and hemiparesis following cerebral infarction affecting left non-dominant side: Secondary | ICD-10-CM

## 2020-12-14 DIAGNOSIS — R54 Age-related physical debility: Secondary | ICD-10-CM | POA: Diagnosis present

## 2020-12-14 DIAGNOSIS — Z8619 Personal history of other infectious and parasitic diseases: Secondary | ICD-10-CM

## 2020-12-14 DIAGNOSIS — I69391 Dysphagia following cerebral infarction: Secondary | ICD-10-CM

## 2020-12-14 DIAGNOSIS — R471 Dysarthria and anarthria: Secondary | ICD-10-CM | POA: Diagnosis present

## 2020-12-14 DIAGNOSIS — Z978 Presence of other specified devices: Secondary | ICD-10-CM

## 2020-12-14 DIAGNOSIS — G9341 Metabolic encephalopathy: Secondary | ICD-10-CM | POA: Diagnosis present

## 2020-12-14 HISTORY — DX: Essential (primary) hypertension: I10

## 2020-12-14 HISTORY — DX: Benign prostatic hyperplasia without lower urinary tract symptoms: N40.0

## 2020-12-14 HISTORY — DX: Atherosclerotic heart disease of native coronary artery without angina pectoris: I25.10

## 2020-12-14 HISTORY — DX: Neuromuscular dysfunction of bladder, unspecified: N31.9

## 2020-12-14 HISTORY — DX: Sick sinus syndrome: I49.5

## 2020-12-14 HISTORY — DX: Cerebral infarction, unspecified: I63.9

## 2020-12-14 HISTORY — DX: Presence of other specified devices: Z97.8

## 2020-12-14 LAB — CBC WITH DIFFERENTIAL/PLATELET
Abs Immature Granulocytes: 0.03 10*3/uL (ref 0.00–0.07)
Basophils Absolute: 0 10*3/uL (ref 0.0–0.1)
Basophils Relative: 0 %
Eosinophils Absolute: 0 10*3/uL (ref 0.0–0.5)
Eosinophils Relative: 0 %
HCT: 33.1 % — ABNORMAL LOW (ref 39.0–52.0)
Hemoglobin: 9.8 g/dL — ABNORMAL LOW (ref 13.0–17.0)
Immature Granulocytes: 0 %
Lymphocytes Relative: 17 %
Lymphs Abs: 1.3 10*3/uL (ref 0.7–4.0)
MCH: 24.7 pg — ABNORMAL LOW (ref 26.0–34.0)
MCHC: 29.6 g/dL — ABNORMAL LOW (ref 30.0–36.0)
MCV: 83.6 fL (ref 80.0–100.0)
Monocytes Absolute: 0.6 10*3/uL (ref 0.1–1.0)
Monocytes Relative: 8 %
Neutro Abs: 5.7 10*3/uL (ref 1.7–7.7)
Neutrophils Relative %: 75 %
Platelets: 238 10*3/uL (ref 150–400)
RBC: 3.96 MIL/uL — ABNORMAL LOW (ref 4.22–5.81)
RDW: 17.2 % — ABNORMAL HIGH (ref 11.5–15.5)
WBC: 7.6 10*3/uL (ref 4.0–10.5)
nRBC: 0 % (ref 0.0–0.2)

## 2020-12-14 NOTE — ED Triage Notes (Signed)
Potential UTI   Granddaughter at bedside brought pt in concerning potential bowels in foley catheters after watery bowel movement this morning. Foley last replaced 7/4. Since then urine has become cloudy and amber in appearence. Pt presents to ED with lower abdominal tenderness. Squirming in chair requiring constant redirection. Behavior not per usual.

## 2020-12-14 NOTE — ED Provider Notes (Signed)
Emergency Medicine Provider Triage Evaluation Note  Todd Dunn , a 80 y.o. male  was evaluated in triage.  Pt complains of lower abdominal pain.  Has hx of recurrent UTI.  Has indwelling foley catheter.  Urine has been cloudy.  Had catheter changed last week, but since then had an episode of diarrhea that was all over his abdomen and penis, family concerned this led to another infection that is causing him to not act normally.    Review of Systems  Positive: Abdominal pain, diarrhea Negative: Vomiting, weakness  Physical Exam  BP (!) 144/68 (BP Location: Right Arm)   Pulse (!) 114   Temp 98.4 F (36.9 C) (Oral)   Resp 18   SpO2 97%  Gen:   Awake, no distress   Resp:  Normal effort  MSK:   Movement of extremities is at baseline Other:    Medical Decision Making  Medically screening exam initiated at 10:46 PM.  Appropriate orders placed.  Desmon L Sylla was informed that the remainder of the evaluation will be completed by another provider, this initial triage assessment does not replace that evaluation, and the importance of remaining in the ED until their evaluation is complete.     Montine Circle, PA-C 12/14/20 2249    Lennice Sites, DO 12/14/20 2318

## 2020-12-15 ENCOUNTER — Encounter (HOSPITAL_COMMUNITY): Payer: Self-pay | Admitting: Emergency Medicine

## 2020-12-15 ENCOUNTER — Emergency Department (HOSPITAL_COMMUNITY): Payer: Medicare Other

## 2020-12-15 DIAGNOSIS — Z87440 Personal history of urinary (tract) infections: Secondary | ICD-10-CM | POA: Diagnosis not present

## 2020-12-15 DIAGNOSIS — Z978 Presence of other specified devices: Secondary | ICD-10-CM | POA: Diagnosis not present

## 2020-12-15 DIAGNOSIS — G928 Other toxic encephalopathy: Secondary | ICD-10-CM | POA: Diagnosis present

## 2020-12-15 DIAGNOSIS — T83518A Infection and inflammatory reaction due to other urinary catheter, initial encounter: Secondary | ICD-10-CM | POA: Diagnosis present

## 2020-12-15 DIAGNOSIS — I495 Sick sinus syndrome: Secondary | ICD-10-CM | POA: Diagnosis present

## 2020-12-15 DIAGNOSIS — Z8619 Personal history of other infectious and parasitic diseases: Secondary | ICD-10-CM

## 2020-12-15 DIAGNOSIS — I69391 Dysphagia following cerebral infarction: Secondary | ICD-10-CM | POA: Diagnosis not present

## 2020-12-15 DIAGNOSIS — T83511A Infection and inflammatory reaction due to indwelling urethral catheter, initial encounter: Secondary | ICD-10-CM

## 2020-12-15 DIAGNOSIS — Z7902 Long term (current) use of antithrombotics/antiplatelets: Secondary | ICD-10-CM | POA: Diagnosis not present

## 2020-12-15 DIAGNOSIS — I2510 Atherosclerotic heart disease of native coronary artery without angina pectoris: Secondary | ICD-10-CM | POA: Diagnosis present

## 2020-12-15 DIAGNOSIS — G9341 Metabolic encephalopathy: Secondary | ICD-10-CM

## 2020-12-15 DIAGNOSIS — I69354 Hemiplegia and hemiparesis following cerebral infarction affecting left non-dominant side: Secondary | ICD-10-CM | POA: Diagnosis not present

## 2020-12-15 DIAGNOSIS — Y846 Urinary catheterization as the cause of abnormal reaction of the patient, or of later complication, without mention of misadventure at the time of the procedure: Secondary | ICD-10-CM | POA: Diagnosis present

## 2020-12-15 DIAGNOSIS — R471 Dysarthria and anarthria: Secondary | ICD-10-CM | POA: Diagnosis present

## 2020-12-15 DIAGNOSIS — R54 Age-related physical debility: Secondary | ICD-10-CM | POA: Diagnosis present

## 2020-12-15 DIAGNOSIS — N400 Enlarged prostate without lower urinary tract symptoms: Secondary | ICD-10-CM | POA: Diagnosis present

## 2020-12-15 DIAGNOSIS — R197 Diarrhea, unspecified: Secondary | ICD-10-CM | POA: Diagnosis present

## 2020-12-15 DIAGNOSIS — N390 Urinary tract infection, site not specified: Secondary | ICD-10-CM | POA: Diagnosis present

## 2020-12-15 DIAGNOSIS — T83511D Infection and inflammatory reaction due to indwelling urethral catheter, subsequent encounter: Secondary | ICD-10-CM | POA: Diagnosis not present

## 2020-12-15 DIAGNOSIS — I69322 Dysarthria following cerebral infarction: Secondary | ICD-10-CM | POA: Diagnosis not present

## 2020-12-15 DIAGNOSIS — N319 Neuromuscular dysfunction of bladder, unspecified: Secondary | ICD-10-CM | POA: Diagnosis present

## 2020-12-15 DIAGNOSIS — I10 Essential (primary) hypertension: Secondary | ICD-10-CM | POA: Diagnosis present

## 2020-12-15 DIAGNOSIS — Z950 Presence of cardiac pacemaker: Secondary | ICD-10-CM | POA: Diagnosis not present

## 2020-12-15 DIAGNOSIS — Z87891 Personal history of nicotine dependence: Secondary | ICD-10-CM | POA: Diagnosis not present

## 2020-12-15 DIAGNOSIS — Z20822 Contact with and (suspected) exposure to covid-19: Secondary | ICD-10-CM | POA: Diagnosis present

## 2020-12-15 DIAGNOSIS — R739 Hyperglycemia, unspecified: Secondary | ICD-10-CM | POA: Diagnosis present

## 2020-12-15 DIAGNOSIS — R4182 Altered mental status, unspecified: Secondary | ICD-10-CM

## 2020-12-15 DIAGNOSIS — Z79899 Other long term (current) drug therapy: Secondary | ICD-10-CM | POA: Diagnosis not present

## 2020-12-15 DIAGNOSIS — D649 Anemia, unspecified: Secondary | ICD-10-CM | POA: Diagnosis present

## 2020-12-15 DIAGNOSIS — R1310 Dysphagia, unspecified: Secondary | ICD-10-CM | POA: Diagnosis present

## 2020-12-15 LAB — COMPREHENSIVE METABOLIC PANEL
ALT: 10 U/L (ref 0–44)
AST: 13 U/L — ABNORMAL LOW (ref 15–41)
Albumin: 2.9 g/dL — ABNORMAL LOW (ref 3.5–5.0)
Alkaline Phosphatase: 111 U/L (ref 38–126)
Anion gap: 6 (ref 5–15)
BUN: 20 mg/dL (ref 8–23)
CO2: 26 mmol/L (ref 22–32)
Calcium: 9.4 mg/dL (ref 8.9–10.3)
Chloride: 106 mmol/L (ref 98–111)
Creatinine, Ser: 0.82 mg/dL (ref 0.61–1.24)
GFR, Estimated: >60 mL/min (ref >60)
Glucose, Bld: 128 mg/dL — ABNORMAL HIGH (ref 70–99)
Potassium: 3.4 mmol/L — ABNORMAL LOW (ref 3.5–5.1)
Sodium: 138 mmol/L (ref 135–145)
Total Bilirubin: 0.6 mg/dL (ref 0.3–1.2)
Total Protein: 6.3 g/dL — ABNORMAL LOW (ref 6.5–8.1)

## 2020-12-15 LAB — CBC
HCT: 30.3 % — ABNORMAL LOW (ref 39.0–52.0)
Hemoglobin: 9.1 g/dL — ABNORMAL LOW (ref 13.0–17.0)
MCH: 24.9 pg — ABNORMAL LOW (ref 26.0–34.0)
MCHC: 30 g/dL (ref 30.0–36.0)
MCV: 83 fL (ref 80.0–100.0)
Platelets: 179 10*3/uL (ref 150–400)
RBC: 3.65 MIL/uL — ABNORMAL LOW (ref 4.22–5.81)
RDW: 17.2 % — ABNORMAL HIGH (ref 11.5–15.5)
WBC: 5.7 10*3/uL (ref 4.0–10.5)
nRBC: 0 % (ref 0.0–0.2)

## 2020-12-15 LAB — BASIC METABOLIC PANEL
Anion gap: 9 (ref 5–15)
BUN: 23 mg/dL (ref 8–23)
CO2: 26 mmol/L (ref 22–32)
Calcium: 10.3 mg/dL (ref 8.9–10.3)
Chloride: 107 mmol/L (ref 98–111)
Creatinine, Ser: 1.02 mg/dL (ref 0.61–1.24)
GFR, Estimated: >60 mL/min (ref >60)
Glucose, Bld: 144 mg/dL — ABNORMAL HIGH (ref 70–99)
Potassium: 3.9 mmol/L (ref 3.5–5.1)
Sodium: 142 mmol/L (ref 135–145)

## 2020-12-15 LAB — URINALYSIS, ROUTINE W REFLEX MICROSCOPIC
Bilirubin Urine: NEGATIVE
Glucose, UA: NEGATIVE mg/dL
Ketones, ur: NEGATIVE mg/dL
Nitrite: POSITIVE — AB
Protein, ur: 100 mg/dL — AB
RBC / HPF: >50 RBC/hpf — ABNORMAL HIGH (ref 0–5)
Specific Gravity, Urine: 1.014 (ref 1.005–1.030)
WBC, UA: >50 WBC/hpf — ABNORMAL HIGH (ref 0–5)
pH: 8 (ref 5.0–8.0)

## 2020-12-15 LAB — RESP PANEL BY RT-PCR (FLU A&B, COVID) ARPGX2
Influenza A by PCR: NEGATIVE
Influenza B by PCR: NEGATIVE
SARS Coronavirus 2 by RT PCR: NEGATIVE

## 2020-12-15 MED ORDER — CITALOPRAM HYDROBROMIDE 20 MG PO TABS
20.0000 mg | ORAL_TABLET | Freq: Every day | ORAL | Status: DC
  Administered 2020-12-15 – 2020-12-18 (×4): 20 mg via ORAL
  Filled 2020-12-15: qty 2
  Filled 2020-12-15 (×4): qty 1

## 2020-12-15 MED ORDER — ACETAMINOPHEN 650 MG RE SUPP: 650.0000 mg | Freq: Four times a day (QID) | RECTAL | Status: DC | PRN

## 2020-12-15 MED ORDER — SODIUM CHLORIDE 0.9 % IV SOLN
1.0000 g | Freq: Two times a day (BID) | INTRAVENOUS | Status: DC
  Administered 2020-12-15: 1 g via INTRAVENOUS
  Filled 2020-12-15 (×3): qty 1

## 2020-12-15 MED ORDER — ONDANSETRON HCL 4 MG PO TABS: 4.0000 mg | ORAL_TABLET | Freq: Four times a day (QID) | ORAL | Status: DC | PRN

## 2020-12-15 MED ORDER — ISOSORBIDE MONONITRATE ER 30 MG PO TB24: 15.0000 mg | ORAL_TABLET | Freq: Every day | ORAL | Status: DC

## 2020-12-15 MED ORDER — LISINOPRIL 10 MG PO TABS
10.0000 mg | ORAL_TABLET | Freq: Every day | ORAL | Status: DC
  Administered 2020-12-15 – 2020-12-18 (×4): 10 mg via ORAL
  Filled 2020-12-15 (×5): qty 1

## 2020-12-15 MED ORDER — SODIUM CHLORIDE 0.9 % IV SOLN
1.0000 g | Freq: Once | INTRAVENOUS | Status: AC
  Administered 2020-12-15: 1 g via INTRAVENOUS
  Filled 2020-12-15: qty 1

## 2020-12-15 MED ORDER — LACTATED RINGERS IV SOLN: INTRAVENOUS | Status: DC

## 2020-12-15 MED ORDER — SODIUM CHLORIDE 0.9 % IV SOLN
1.0000 g | Freq: Three times a day (TID) | INTRAVENOUS | Status: AC
  Administered 2020-12-16 – 2020-12-18 (×10): 1 g via INTRAVENOUS
  Filled 2020-12-15 (×10): qty 1

## 2020-12-15 MED ORDER — FINASTERIDE 5 MG PO TABS
5.0000 mg | ORAL_TABLET | Freq: Every day | ORAL | Status: DC
  Administered 2020-12-15 – 2020-12-18 (×4): 5 mg via ORAL
  Filled 2020-12-15 (×5): qty 1

## 2020-12-15 MED ORDER — ACETAMINOPHEN 325 MG PO TABS
650.0000 mg | ORAL_TABLET | Freq: Once | ORAL | Status: AC
  Administered 2020-12-15: 650 mg via ORAL
  Filled 2020-12-15: qty 2

## 2020-12-15 MED ORDER — ENOXAPARIN SODIUM 40 MG/0.4ML IJ SOSY
40.0000 mg | PREFILLED_SYRINGE | INTRAMUSCULAR | Status: DC
  Administered 2020-12-15 – 2020-12-19 (×4): 40 mg via SUBCUTANEOUS
  Filled 2020-12-15 (×5): qty 0.4

## 2020-12-15 MED ORDER — CLOPIDOGREL BISULFATE 75 MG PO TABS
75.0000 mg | ORAL_TABLET | Freq: Every day | ORAL | Status: DC
  Administered 2020-12-15 – 2020-12-18 (×4): 75 mg via ORAL
  Filled 2020-12-15 (×5): qty 1

## 2020-12-15 MED ORDER — AMLODIPINE BESYLATE 5 MG PO TABS: 5.0000 mg | ORAL_TABLET | Freq: Every day | ORAL | Status: DC

## 2020-12-15 MED ORDER — LORATADINE 10 MG PO TABS
10.0000 mg | ORAL_TABLET | Freq: Once | ORAL | Status: AC
  Administered 2020-12-15: 10 mg via ORAL
  Filled 2020-12-15: qty 1

## 2020-12-15 MED ORDER — KETOROLAC TROMETHAMINE 30 MG/ML IJ SOLN: 15.0000 mg | Freq: Once | INTRAMUSCULAR | Status: DC

## 2020-12-15 MED ORDER — ACETAMINOPHEN 325 MG PO TABS: 650.0000 mg | ORAL_TABLET | Freq: Four times a day (QID) | ORAL | Status: DC | PRN

## 2020-12-15 MED ORDER — ONDANSETRON HCL 4 MG/2ML IJ SOLN: 4.0000 mg | Freq: Four times a day (QID) | INTRAMUSCULAR | Status: DC | PRN

## 2020-12-15 MED ORDER — AMLODIPINE BESYLATE 5 MG PO TABS
5.0000 mg | ORAL_TABLET | Freq: Every day | ORAL | Status: DC
  Administered 2020-12-15 – 2020-12-18 (×4): 5 mg via ORAL
  Filled 2020-12-15 (×5): qty 1

## 2020-12-15 NOTE — Plan of Care (Signed)
  Problem: Education: Goal: Knowledge of General Education information will improve Description Including pain rating scale, medication(s)/side effects and non-pharmacologic comfort measures Outcome: Progressing   

## 2020-12-15 NOTE — Progress Notes (Signed)
Pharmacy Antibiotic Note  Todd Dunn is a 80 y.o. male admitted on 12/14/2020 with UTI.  Pharmacy has been consulted for meropenem dosing.  Of note pt was admitted to [facility] 91mo ago for UTI, initially started on cefepime and changed to meropenem for ESBL E.coli and Proteus; pt's hx notes a PCN allergy but pt cannot report what the rxn is, PCN allergy was documented in [facility] system 1d after Keflex was started so EDP concerned that pt could also be allergic to cephalosporins.  Plan: Merrem 1g IV Q12H.  Height: 6\' 1"  (185.4 cm) Weight: 56.7 kg (125 lb) IBW/kg (Calculated) : 79.9  Temp (24hrs), Avg:97.9 F (36.6 C), Min:97.5 F (36.4 C), Max:98.4 F (36.9 C)  Recent Labs  Lab 12/08/20 1400 12/14/20 2245  WBC 10.2 7.6  CREATININE 0.88 1.02    Estimated Creatinine Clearance: 45.6 mL/min (by C-G formula based on SCr of 1.02 mg/dL).    Allergies  Allergen Reactions   Aspirin Itching   [place] Clam (M. [place]en[place]) Skin Test Itching   Penicillins      Thank you for allowing pharmacy to be a part of this patient's care.  Wynona Neat, PharmD, BCPS  12/15/2020 2:07 AM

## 2020-12-15 NOTE — Progress Notes (Signed)
PHARMACY NOTE:  ANTIMICROBIAL RENAL DOSAGE ADJUSTMENT  Current antimicrobial regimen includes a mismatch between antimicrobial dosage and estimated renal function.  As per policy approved by the Pharmacy & Therapeutics and Medical Executive Committees, the antimicrobial dosage will be adjusted accordingly.  Current antimicrobial dosage:  meropenem 1g Q12hr  Indication: UTI with hx ESBL UTI   Renal Function:  Estimated Creatinine Clearance: 56.7 mL/min (by C-G formula based on SCr of 0.82 mg/dL).     Antimicrobial dosage has been changed to:  1g Q8 hr     Thank you for allowing pharmacy to be a part of this patient's care.  Benetta Spar, PharmD, BCPS, BCCP Clinical Pharmacist  Please check AMION for all [place] phone numbers After 10:00 PM, call [place] [phone]

## 2020-12-15 NOTE — ED Provider Notes (Signed)
[facility] EMERGENCY DEPARTMENT Provider Note   CSN: [id number] Arrival date & time: 12/14/20  2219     History Chief Complaint  Patient presents with   Urinary Problem    Altered Mental Status    Todd Dunn is a 80 y.o. male.  The history is provided by medical records. The history is limited by the condition of the patient.  Altered Mental Status Presenting symptoms: confusion   Severity:  Moderate Most recent episode:  Yesterday Episode history:  Single Duration:  1 day Timing:  Constant Progression:  Unchanged Chronicity:  Recurrent Context: recent infection   Associated symptoms: no fever and no vomiting   Patient with a h/o UTI     History reviewed. No pertinent past medical history.  There are no problems to display for this patient.   History reviewed. No pertinent surgical history.     History reviewed. No pertinent family history.     Home Medications Prior to Admission medications   Medication Sig Start Date End Date Taking? Authorizing Provider  acetaminophen (TYLENOL) 325 MG tablet Take 650 mg by mouth every 4 (four) hours as needed for mild pain.    [provider]  amLODipine (NORVASC) 10 MG tablet Take 10 mg by mouth daily.    [provider]  citalopram (CELEXA) 20 MG tablet Take 20 mg by mouth at bedtime.    [provider]  clopidogrel (PLAVIX) 75 MG tablet Take 75 mg by mouth at bedtime.    [provider]  finasteride (PROSCAR) 5 MG tablet Take 5 mg by mouth daily.    [provider]  furosemide (LASIX) 20 MG tablet Take 20 mg by mouth daily.    [provider]  hydrALAZINE (APRESOLINE) 50 MG tablet Take 50 mg by mouth 3 (three) times daily.    [provider]  isosorbide mononitrate (IMDUR) 60 MG 24 hr tablet Take 60 mg by mouth daily.    [provider]  lisinopril (ZESTRIL) 40 MG tablet Take 40 mg by mouth daily.    [provider]   magnesium hydroxide (MILK OF MAGNESIA) 400 MG/5ML suspension Take 30 mLs by mouth See admin instructions. Give 57ml by mouth every 72 hours as needed for constipation.    [provider]  nitrofurantoin (MACRODANTIN) 50 MG capsule Take 50 mg by mouth at bedtime.    [provider]  nitroGLYCERIN (NITROSTAT) 0.4 MG SL tablet Place 0.4 mg under the tongue every 5 (five) minutes as needed for chest pain.    [provider]  polyethylene glycol (MIRALAX / GLYCOLAX) 17 g packet Take 17 g by mouth daily.    [provider]  promethazine (PHENERGAN) 25 MG/ML injection Inject 12.5 mg into the muscle every 6 (six) hours as needed for nausea or vomiting.    [provider]  rosuvastatin (CRESTOR) 10 MG tablet Take 10 mg by mouth at bedtime.    [provider]  tamsulosin (FLOMAX) 0.4 MG CAPS capsule Take 0.4 mg by mouth daily.    [provider]  Vitamin D, Ergocalciferol, (DRISDOL) 1.25 MG (50000 UNIT) CAPS capsule Take 50,000 Units by mouth every 7 (seven) days.    [provider]    Allergies    Aspirin, [place] clam (m. mercenaria) skin test, and Penicillins  Review of Systems   Review of Systems  Unable to perform ROS: Mental status change  Constitutional:  Negative for fever.  HENT:  Negative for  facial swelling.   Eyes:  Negative for redness.  Respiratory:  Negative for stridor.   Gastrointestinal:  Negative for vomiting.  Genitourinary:  Negative for penile swelling.  Neurological:  Negative for facial asymmetry.  Psychiatric/Behavioral:  Positive for confusion.   All other systems reviewed and are negative.  Physical Exam Updated Vital Signs BP (!) 177/75   Pulse (!) 121   Temp (!) 97.5 F (36.4 C) (Oral)   Resp (!) 26   Ht 6\' 1"  (1.854 m)   Wt 56.7 kg   SpO2 99%   BMI 16.49 kg/m   Physical Exam Vitals and nursing note reviewed.  Constitutional:      General: He is not in acute distress.     Appearance: Normal appearance.  HENT:     Head: Normocephalic and atraumatic.  Neurological:     Mental Status: He is alert.    ED Results / Procedures / Treatments   Labs (all labs ordered are listed, but only abnormal results are displayed) Results for orders placed or performed during the hospital encounter of 12/14/20  Urinalysis, Routine w reflex microscopic Urine, Catheterized  Result Value Ref Range   Color, Urine YELLOW YELLOW   APPearance TURBID (A) CLEAR   Specific Gravity, Urine 1.014 1.005 - 1.030   pH 8.0 5.0 - 8.0   Glucose, UA NEGATIVE NEGATIVE mg/dL   Hgb urine dipstick MODERATE (A) NEGATIVE   Bilirubin Urine NEGATIVE NEGATIVE   Ketones, ur NEGATIVE NEGATIVE mg/dL   Protein, ur 100 (A) NEGATIVE mg/dL   Nitrite POSITIVE (A) NEGATIVE   Leukocytes,Ua LARGE (A) NEGATIVE   RBC / HPF >50 (H) 0 - 5 RBC/hpf   WBC, UA >50 (H) 0 - 5 WBC/hpf   Bacteria, UA RARE (A) NONE SEEN   WBC Clumps PRESENT    Ca Oxalate Crys, UA PRESENT   CBC with Differential  Result Value Ref Range   WBC 7.6 4.0 - 10.5 K/uL   RBC 3.96 (L) 4.22 - 5.81 MIL/uL   Hemoglobin 9.8 (L) 13.0 - 17.0 g/dL   HCT 33.1 (L) 39.0 - 52.0 %   MCV 83.6 80.0 - 100.0 fL   MCH 24.7 (L) 26.0 - 34.0 pg   MCHC 29.6 (L) 30.0 - 36.0 g/dL   RDW 17.2 (H) 11.5 - 15.5 %   Platelets 238 150 - 400 K/uL   nRBC 0.0 0.0 - 0.2 %   Neutrophils Relative % 75 %   Neutro Abs 5.7 1.7 - 7.7 K/uL   Lymphocytes Relative 17 %   Lymphs Abs 1.3 0.7 - 4.0 K/uL   Monocytes Relative 8 %   Monocytes Absolute 0.6 0.1 - 1.0 K/uL   Eosinophils Relative 0 %   Eosinophils Absolute 0.0 0.0 - 0.5 K/uL   Basophils Relative 0 %   Basophils Absolute 0.0 0.0 - 0.1 K/uL   Immature Granulocytes 0 %   Abs Immature Granulocytes 0.03 0.00 - 0.07 K/uL  Basic metabolic panel  Result Value Ref Range   Sodium 142 135 - 145 mmol/L   Potassium 3.9 3.5 - 5.1 mmol/L   Chloride 107 98 - 111 mmol/L   CO2 26 22 - 32 mmol/L   Glucose, Bld 144 (H) 70 - 99  mg/dL   BUN 23 8 - 23 mg/dL   Creatinine, Ser 1.02 0.61 - 1.24 mg/dL   Calcium 10.3 8.9 - 10.3 mg/dL   GFR, Estimated >60 >60 mL/min   Anion gap 9 5 - 15   DG  Thoracic Spine 2 View  Result Date: 12/08/2020 CLINICAL DATA:  Fall with middle spine pain EXAM: THORACIC SPINE 2 VIEWS COMPARISON:  Chest x-ray 09/15/2020 FINDINGS: Post sternotomy changes and cardiac pacing device. Vertebral body heights are maintained. Diffuse degenerative changes. IMPRESSION: Degenerative changes.  No acute osseous abnormality Electronically Signed   By: Donavan Foil M.D.   On: 12/08/2020 16:57   CT Head Wo Contrast  Result Date: 12/08/2020 CLINICAL DATA:  Head and neck trauma secondary to a fall. EXAM: CT HEAD WITHOUT CONTRAST CT CERVICAL SPINE WITHOUT CONTRAST TECHNIQUE: Multidetector CT imaging of the head and cervical spine was performed following the standard protocol without intravenous contrast. Multiplanar CT image reconstructions of the cervical spine were also generated. COMPARISON:  09/15/2020 FINDINGS: CT HEAD FINDINGS Brain: There is no evidence for acute hemorrhage, hydrocephalus, mass lesion, or abnormal extra-axial fluid collection. No definite CT evidence for acute infarction. Diffuse loss of parenchymal volume is consistent with atrophy. Patchy low attenuation in the deep hemispheric and periventricular white matter is nonspecific, but likely reflects chronic microvascular ischemic demyelination. Vascular: No hyperdense vessel or unexpected calcification. Skull: No evidence for fracture. No worrisome lytic or sclerotic lesion. Sinuses/Orbits: The visualized paranasal sinuses and mastoid air cells are clear. Visualized portions of the globes and intraorbital fat are unremarkable. Other: Small scalp lipoma noted left forehead. CT CERVICAL SPINE FINDINGS Alignment: Straightening of normal cervical lordosis. No subluxation. Skull base and vertebrae: No acute fracture. No primary bone lesion or focal pathologic  process. Soft tissues and spinal canal: No prevertebral fluid or swelling. No visible canal hematoma. Disc levels: Diffuse loss of intervertebral disc height with endplate degeneration noted in the cervical spine, similar to prior. Upper chest: Centrilobular emphysema noted in lung apices. Other: None. IMPRESSION: 1. No acute intracranial abnormality. Atrophy with chronic small vessel white matter ischemic disease. 2. Degenerative changes in the cervical spine without fracture. 3. Loss of cervical lordosis. This can be related to patient positioning, muscle spasm or soft tissue injury. Electronically Signed   By: Misty Stanley M.D.   On: 12/08/2020 15:04   CT Cervical Spine Wo Contrast  Result Date: 12/08/2020 CLINICAL DATA:  Head and neck trauma secondary to a fall. EXAM: CT HEAD WITHOUT CONTRAST CT CERVICAL SPINE WITHOUT CONTRAST TECHNIQUE: Multidetector CT imaging of the head and cervical spine was performed following the standard protocol without intravenous contrast. Multiplanar CT image reconstructions of the cervical spine were also generated. COMPARISON:  09/15/2020 FINDINGS: CT HEAD FINDINGS Brain: There is no evidence for acute hemorrhage, hydrocephalus, mass lesion, or abnormal extra-axial fluid collection. No definite CT evidence for acute infarction. Diffuse loss of parenchymal volume is consistent with atrophy. Patchy low attenuation in the deep hemispheric and periventricular white matter is nonspecific, but likely reflects chronic microvascular ischemic demyelination. Vascular: No hyperdense vessel or unexpected calcification. Skull: No evidence for fracture. No worrisome lytic or sclerotic lesion. Sinuses/Orbits: The visualized paranasal sinuses and mastoid air cells are clear. Visualized portions of the globes and intraorbital fat are unremarkable. Other: Small scalp lipoma noted left forehead. CT CERVICAL SPINE FINDINGS Alignment: Straightening of normal cervical lordosis. No subluxation. Skull  base and vertebrae: No acute fracture. No primary bone lesion or focal pathologic process. Soft tissues and spinal canal: No prevertebral fluid or swelling. No visible canal hematoma. Disc levels: Diffuse loss of intervertebral disc height with endplate degeneration noted in the cervical spine, similar to prior. Upper chest: Centrilobular emphysema noted in lung apices. Other: None. IMPRESSION: 1. No acute intracranial  abnormality. Atrophy with chronic small vessel white matter ischemic disease. 2. Degenerative changes in the cervical spine without fracture. 3. Loss of cervical lordosis. This can be related to patient positioning, muscle spasm or soft tissue injury. Electronically Signed   By: Misty Stanley M.D.   On: 12/08/2020 15:04   DG Chest Portable 1 View  Result Date: 12/15/2020 CLINICAL DATA:  Altered mental status EXAM: PORTABLE CHEST 1 VIEW COMPARISON:  11/11/2020 FINDINGS: Cardiac shadow is mildly enlarged but stable. Defibrillator is again seen and stable. Postsurgical changes are noted. Lungs are clear bilaterally. No focal infiltrate or effusion is noted. IMPRESSION: No acute abnormality noted. Electronically Signed   By: Inez Catalina M.D.   On: 12/15/2020 01:24    EKG None  Radiology DG Chest Portable 1 View  Result Date: 12/15/2020 CLINICAL DATA:  Altered mental status EXAM: PORTABLE CHEST 1 VIEW COMPARISON:  11/11/2020 FINDINGS: Cardiac shadow is mildly enlarged but stable. Defibrillator is again seen and stable. Postsurgical changes are noted. Lungs are clear bilaterally. No focal infiltrate or effusion is noted. IMPRESSION: No acute abnormality noted. Electronically Signed   By: Inez Catalina M.D.   On: 12/15/2020 01:24    Procedures Procedures   Medications Ordered in ED Medications  meropenem (MERREM) 1 g in sodium chloride 0.9 % 100 mL IVPB (has no administration in time range)  meropenem (MERREM) 1 g in sodium chloride 0.9 % 100 mL IVPB (has no administration in time range)     ED Course  I have reviewed the triage vital signs and the nursing notes.  Pertinent labs & imaging results that were available during my care of the patient were reviewed by me and considered in my medical decision making (see chart for details).   Given ESBL, will need to admit to medicine     Final Clinical Impression(s) / ED Diagnoses Final diagnoses:  Altered mental status, unspecified altered mental status type  Lower urinary tract infectious disease   Admit to medicine     Alois Mincer, MD 12/15/20 [id number]

## 2020-12-15 NOTE — ED Notes (Signed)
Foley catheter advanced per Palumbo. Leg bag removed and standard drainage bag applied. New securing device applied as well.

## 2020-12-15 NOTE — ED Notes (Signed)
Attempted to call daughter. No answer

## 2020-12-15 NOTE — ED Notes (Signed)
Pt ate all his dinner. Provided a new cup of water at the bedside.

## 2020-12-15 NOTE — H&P (Signed)
History and Physical    BALTASAR TWILLEY JAS:[id number] DOB: 26-Sep-1940 DOA: 12/14/2020  PCP: Pcp, No  Patient coming from: Home  I have personally briefly reviewed patient's old medical records in [place]  Chief Complaint: UTI, AMS  HPI: Todd Dunn is a 80 y.o. male with medical history significant of stroke, HTN, BPH, SSS s/p PPM, neurogenic bladder.  Pt has chronic indwelling foley which has been complicated by recurrent UTIs over the past couple of months.  Further complicated by resistant organisms (multiple cultures of ESBLs and a very resistant proteus, also mention of pseudomonas at some point according to hospital documentation from [facility] system when he was admitted in May).  Pt brought in to ED by granddaughter.  Reportedly concern for AMS and UTI.  Behavior not per usual according to report.  Urine has been cloudy.  Had foley changed last week.  Had episode of diarrhea that was all over abdomen and foley.  Pt unable to contribute to history due to AMS   ED Course: Pt has UTI with purulent urine.  Nitrite positive.  CT: 1) Pts foley is actually inflated in prostatic urethra 2) no evidence of colovesicular fistula.  Foley advanced -> abdominal pain symptoms seem dramatically improved, though he is still confused.  Pt started on merrem.   Review of Systems: Unable to perform due to AMS  Past Medical History:  Diagnosis Date   BPH (benign prostatic hyperplasia)    CAD (coronary artery disease)    Chronic indwelling Foley catheter    CVA (cerebral vascular accident) ([place])    HTN (hypertension)    Neurogenic bladder    SSS (sick sinus syndrome) ([place])     Past Surgical History:  Procedure Laterality Date   APPENDECTOMY     CARDIAC SURGERY     PACEMAKER PLACEMENT       reports that he has quit smoking. His smoking use included cigarettes. He does not have any smokeless tobacco history on file. He reports previous alcohol use. He reports that he  does not use drugs.  Allergies  Allergen Reactions   Aspirin Itching   [place] Clam (M. Mercenaria) Skin Test Itching   Penicillins     Family History  Family history unknown: Yes     Prior to Admission medications   Medication Sig Start Date End Date Taking? Authorizing Provider  acetaminophen (TYLENOL) 325 MG tablet Take 650 mg by mouth every 4 (four) hours as needed for mild pain.    [provider]  amLODipine (NORVASC) 10 MG tablet Take 10 mg by mouth daily.    [provider]  citalopram (CELEXA) 20 MG tablet Take 20 mg by mouth at bedtime.    [provider]  clopidogrel (PLAVIX) 75 MG tablet Take 75 mg by mouth at bedtime.    [provider]  finasteride (PROSCAR) 5 MG tablet Take 5 mg by mouth daily.    [provider]  furosemide (LASIX) 20 MG tablet Take 20 mg by mouth daily.    [provider]  hydrALAZINE (APRESOLINE) 50 MG tablet Take 50 mg by mouth 3 (three) times daily.    [provider]  isosorbide mononitrate (IMDUR) 60 MG 24 hr tablet Take 60 mg by mouth daily.    [provider]  lisinopril (ZESTRIL) 40 MG tablet Take 40 mg by mouth daily.    [provider]  magnesium hydroxide (MILK OF MAGNESIA) 400 MG/5ML suspension Take 30 mLs by mouth  See admin instructions. Give 60ml by mouth every 72 hours as needed for constipation.    [provider]  nitrofurantoin (MACRODANTIN) 50 MG capsule Take 50 mg by mouth at bedtime.    [provider]  nitroGLYCERIN (NITROSTAT) 0.4 MG SL tablet Place 0.4 mg under the tongue every 5 (five) minutes as needed for chest pain.    [provider]  polyethylene glycol (MIRALAX / GLYCOLAX) 17 g packet Take 17 g by mouth daily.    [provider]  promethazine (PHENERGAN) 25 MG/ML injection Inject 12.5 mg into the muscle every 6 (six) hours as needed for nausea or vomiting.    [provider]  rosuvastatin  (CRESTOR) 10 MG tablet Take 10 mg by mouth at bedtime.    [provider]  tamsulosin (FLOMAX) 0.4 MG CAPS capsule Take 0.4 mg by mouth daily.    [provider]  Vitamin D, Ergocalciferol, (DRISDOL) 1.25 MG (50000 UNIT) CAPS capsule Take 50,000 Units by mouth every 7 (seven) days.    [provider]    Physical Exam: Vitals:   12/15/20 0130 12/15/20 0145 12/15/20 0200 12/15/20 0233  BP: (!) 176/76  (!) 177/75 (!) 148/76  Pulse: (!) 111 86 (!) 121 96  Resp: (!) 22 (!) 26  16  Temp:      TempSrc:      SpO2: 99% 97% 99% 96%  Weight:      Height:        Constitutional: NAD, calm, comfortable Eyes: PERRL, lids and conjunctivae normal ENMT: Mucous membranes are moist. Posterior pharynx clear of any exudate or lesions.Normal dentition.  Neck: normal, supple, no masses, no thyromegaly Respiratory: clear to auscultation bilaterally, no wheezing, no crackles. Normal respiratory effort. No accessory muscle use.  Cardiovascular: Regular rate and rhythm, no murmurs / rubs / gallops. No extremity edema. 2+ pedal pulses. No carotid bruits.  Abdomen: no tenderness, no masses palpated. No hepatosplenomegaly. Bowel sounds positive.  Musculoskeletal: no clubbing / cyanosis. No joint deformity upper and lower extremities. Good ROM, no contractures. Normal muscle tone.  Skin: no rashes, lesions, ulcers. No induration Neurologic: MAE Psychiatric: Pt is very confused.   Labs on Admission: I have personally reviewed following labs and imaging studies  CBC: Recent Labs  Lab 12/08/20 1400 12/14/20 2245  WBC 10.2 7.6  NEUTROABS 8.7* 5.7  HGB 9.0* 9.8*  HCT 30.2* 33.1*  MCV 83.2 83.6  PLT 222 267   Basic Metabolic Panel: Recent Labs  Lab 12/08/20 1400 12/14/20 2245  NA 141 142  K 3.6 3.9  CL 104 107  CO2 29 26  GLUCOSE 109* 144*  BUN 18 23  CREATININE 0.88 1.02  CALCIUM 10.0 10.3   GFR: Estimated Creatinine Clearance: 45.6 mL/min (by C-G formula based on  SCr of 1.02 mg/dL). Liver Function Tests: No results for input(s): AST, ALT, ALKPHOS, BILITOT, PROT, ALBUMIN in the last 168 hours. No results for input(s): LIPASE, AMYLASE in the last 168 hours. No results for input(s): AMMONIA in the last 168 hours. Coagulation Profile: No results for input(s): INR, PROTIME in the last 168 hours. Cardiac Enzymes: No results for input(s): CKTOTAL, CKMB, CKMBINDEX, TROPONINI in the last 168 hours. BNP (last 3 results) No results for input(s): PROBNP in the last 8760 hours. HbA1C: No results for input(s): HGBA1C in the last 72 hours. CBG: No results for input(s): GLUCAP in the last 168 hours. Lipid Profile: No results for input(s): CHOL, HDL, LDLCALC, TRIG, CHOLHDL, LDLDIRECT in the last  72 hours. Thyroid Function Tests: No results for input(s): TSH, T4TOTAL, FREET4, T3FREE, THYROIDAB in the last 72 hours. Anemia Panel: No results for input(s): VITAMINB12, FOLATE, FERRITIN, TIBC, IRON, RETICCTPCT in the last 72 hours. Urine analysis:    Component Value Date/Time   COLORURINE YELLOW 12/15/2020 0117   APPEARANCEUR TURBID (A) 12/15/2020 0117   LABSPEC 1.014 12/15/2020 0117   PHURINE 8.0 12/15/2020 0117   GLUCOSEU NEGATIVE 12/15/2020 0117   HGBUR MODERATE (A) 12/15/2020 0117   BILIRUBINUR NEGATIVE 12/15/2020 0117   KETONESUR NEGATIVE 12/15/2020 0117   PROTEINUR 100 (A) 12/15/2020 0117   NITRITE POSITIVE (A) 12/15/2020 0117   LEUKOCYTESUR LARGE (A) 12/15/2020 0117    Radiological Exams on Admission: CT Head Wo Contrast  Result Date: 12/15/2020 CLINICAL DATA:  80 year old male with delirium. EXAM: CT HEAD WITHOUT CONTRAST TECHNIQUE: Contiguous axial images were obtained from the base of the skull through the vertex without intravenous contrast. COMPARISON:  Head CT dated 12/08/2020. FINDINGS: Brain: Moderate age-related atrophy and chronic microvascular ischemic changes. There is no acute intracranial hemorrhage. No mass effect midline shift. No  extra-axial fluid collection. Vascular: No hyperdense vessel or unexpected calcification. Skull: Normal. Negative for fracture or focal lesion. Sinuses/Orbits: No acute finding. Other: Small left supraorbital/forehead lipoma. IMPRESSION: 1. No acute intracranial pathology. 2. Moderate age-related atrophy and chronic microvascular ischemic changes. Electronically Signed   By: Anner Crete M.D.   On: 12/15/2020 02:41   DG Chest Portable 1 View  Result Date: 12/15/2020 CLINICAL DATA:  Altered mental status EXAM: PORTABLE CHEST 1 VIEW COMPARISON:  11/11/2020 FINDINGS: Cardiac shadow is mildly enlarged but stable. Defibrillator is again seen and stable. Postsurgical changes are noted. Lungs are clear bilaterally. No focal infiltrate or effusion is noted. IMPRESSION: No acute abnormality noted. Electronically Signed   By: Inez Catalina M.D.   On: 12/15/2020 01:24   CT Renal Stone Study  Result Date: 12/15/2020 CLINICAL DATA:  Flank pain and history of diarrhea EXAM: CT ABDOMEN AND PELVIS WITHOUT CONTRAST TECHNIQUE: Multidetector CT imaging of the abdomen and pelvis was performed following the standard protocol without IV contrast. COMPARISON:  11/11/2020 FINDINGS: Lower chest: No acute abnormality. Hepatobiliary: No focal liver abnormality is seen. No gallstones, gallbladder wall thickening, or biliary dilatation. Pancreas: Unremarkable. No pancreatic ductal dilatation or surrounding inflammatory changes. Spleen: Normal in size without focal abnormality. Adrenals/Urinary Tract: Right adrenal gland is within normal limits. Somewhat rounded appearance to the left adrenal gland is noted but stable. Nonobstructing right renal stone is noted in the upper pole measuring 3 mm. Vascular calcifications are seen. Bladder is well distended. Foley catheter is noted although is not in adequate position. The Foley balloon catheter is now noted within the prosthetic urethra. This should be deflated and advanced deeper into  the bladder. Some calcification is noted along the posterior wall of the urinary bladder. This may represent a small bladder calculus but was not present on the prior exam. Could also represent recently passed calculi. Stomach/Bowel: Colon is well visualized and within normal limits. No inflammatory changes are seen. Stomach and small bowel are unremarkable. The appendix is not well visualized although no inflammatory changes to suggest appendicitis are seen. Vascular/Lymphatic: Aortic atherosclerosis. No enlarged abdominal or pelvic lymph nodes. Reproductive: Prostate is within normal limits although the Foley catheter is misplaced with the balloon in the prosthetic urethra. Other: No abdominal wall hernia or abnormality. No abdominopelvic ascites. Musculoskeletal: No acute or significant osseous findings. IMPRESSION: Foley catheter is noted with the balloon in  the prosthetic urethra. This should be deflated and readvanced into the bladder. No findings to suggest colovesical fistula. Dependent calcification within the bladder which may represent an early bladder stone or possibly recently passed calculus. Prominent left adrenal gland stable in appearance from the prior exam. Nonobstructing right renal stone also stable from the prior study. Electronically Signed   By: Inez Catalina M.D.   On: 12/15/2020 02:45    EKG: Independently reviewed.  Assessment/Plan Principal Problem:   Catheter-associated urinary tract infection (HCC) Active Problems:   Acute metabolic encephalopathy   Chronic indwelling Foley catheter   HTN (hypertension)   History of ESBL E. coli infection    CAUTI - With h/o ESBL and more recently a very resistant Proteus Merrem Culture pending IVF: LR at 75 Repeat CBC/ BMP in AM Acute metabolic encephalopathy - Delirium secondary to #1 most likely CT head neg Based on prior notes: seems like at baseline he has L sided weakness and fairly severe dysarthria / speech difficulty  secondary to his prior stroke. Chronic indwelling foley - Initial pain due to foley being inflated in prostatic urethra. Abd pain improved dramatically after foley advanced HTN - Med rec pending  DVT prophylaxis: Lovenox Code Status: Full Family Communication: No family in room Disposition Plan: Home after treatment for CAUTI Consults called: None Admission status: Admit to inpatient  Severity of Illness: The appropriate patient status for this patient is INPATIENT. Inpatient status is judged to be reasonable and necessary in order to provide the required intensity of service to ensure the patient's safety. The patient's presenting symptoms, physical exam findings, and initial radiographic and laboratory data in the context of their chronic comorbidities is felt to place them at high risk for further clinical deterioration. Furthermore, it is not anticipated that the patient will be medically stable for discharge from the hospital within 2 midnights of admission. The following factors support the patient status of inpatient.   IP status for CAUTI complicated by: 1) extensive h/o MDRO necessitating IV carbapenem ABx therapy. 2) acute metabolic encephalopathy.   * I certify that at the point of admission it is my clinical judgment that the patient will require inpatient hospital care spanning beyond 2 midnights from the point of admission due to high intensity of service, high risk for further deterioration and high frequency of surveillance required.*   Arliss Frisina M. DO [facility]  How to contact the [facility] Attending or Consulting provider [place] or covering provider during after hours [place], for this patient?  Check the care team in [facility] and look for a) attending/consulting [facility] provider listed and b) the [facility] team listed Log into [email or web address]  Amion Physician Scheduling and messaging for groups and whole hospitals  On call and physician scheduling software for group  practices, residents, hospitalists and other medical providers for call, clinic, rotation and shift schedules. OnCall [employer] is a hospital-wide system for scheduling doctors and paging doctors on call. EasyPlot is for scientific plotting and data analysis.  [email or web address]  and use [place]'s universal password to access. If you do not have the password, please contact the hospital operator.  Locate the [facility] provider you are looking for under [facility] and page to a number that you can be directly reached. If you still have difficulty reaching the provider, please page the [facility] (Director on Call) for the Hospitalists listed on amion for assistance.  12/15/2020, 3:09 AM

## 2020-12-15 NOTE — ED Notes (Signed)
Sit patient up to [place] breakfast patient has call bell in reach

## 2020-12-15 NOTE — Progress Notes (Addendum)
Patient seen and examined, admitted earlier this morning by Dr. Alcario Drought briefly Todd Dunn is an 80 year old male with history of CVA with residual left hemiplegia, dysphagia and dysarthria, BPH, neurogenic bladder with chronic Foley, SSS status post PPM, recurrent UTIs was brought to the ED with lower abdominal discomfort, change in color, cloudy urine and worsening mental status. -In the ED his urinalysis was abnormal, Turbid appearing with positive nitrite, leukoesterase, greater than 50 WBCs, on CT-Foley catheter is noted with the balloon in the prosthetic urethra.  Catheter associated UTI -Foley catheter was last changed on 7/4,, balloon was noted to be in the prostatic urethra on imaging and this was advanced -History of recurrent resistant infections including ESBL, continue meropenem -Follow-up urine culture  Metabolic encephalopathy -Likely secondary to above, improving, CT head unremarkable  Neurogenic bladder, chronic Foley -Foley catheter advanced so balloon in the bladder now  History of CVA -With residual left hemiplegia, dysarthria and dysphagia -Will change diet to pured, dysphagia diet -Restart Plavix  Hypertension -Restart amlodipine, lisinopril -Imdur and hydralazine on hold  Hyperglycemia -check hba1c  Chronic anemia -stable, monitor  Todd Polite, MD

## 2020-12-15 NOTE — ED Notes (Signed)
Floor nurse asked about wallet and cell phone. Informed nurse the pt had a cell phone but I did not witness a wallet in pt possession. Cell phone will be brought to pt.

## 2020-12-15 NOTE — ED Notes (Signed)
Pt transported to CT ?

## 2020-12-16 DIAGNOSIS — T83511D Infection and inflammatory reaction due to indwelling urethral catheter, subsequent encounter: Secondary | ICD-10-CM

## 2020-12-16 LAB — CBC
HCT: 31.5 % — ABNORMAL LOW (ref 39.0–52.0)
Hemoglobin: 9.6 g/dL — ABNORMAL LOW (ref 13.0–17.0)
MCH: 24.9 pg — ABNORMAL LOW (ref 26.0–34.0)
MCHC: 30.5 g/dL (ref 30.0–36.0)
MCV: 81.6 fL (ref 80.0–100.0)
Platelets: 181 10*3/uL (ref 150–400)
RBC: 3.86 MIL/uL — ABNORMAL LOW (ref 4.22–5.81)
RDW: 17.3 % — ABNORMAL HIGH (ref 11.5–15.5)
WBC: 4.2 10*3/uL (ref 4.0–10.5)
nRBC: 0 % (ref 0.0–0.2)

## 2020-12-16 LAB — BASIC METABOLIC PANEL
Anion gap: 3 — ABNORMAL LOW (ref 5–15)
BUN: 14 mg/dL (ref 8–23)
CO2: 29 mmol/L (ref 22–32)
Calcium: 9.5 mg/dL (ref 8.9–10.3)
Chloride: 107 mmol/L (ref 98–111)
Creatinine, Ser: 0.64 mg/dL (ref 0.61–1.24)
GFR, Estimated: >60 mL/min (ref >60)
Glucose, Bld: 114 mg/dL — ABNORMAL HIGH (ref 70–99)
Potassium: 4 mmol/L (ref 3.5–5.1)
Sodium: 139 mmol/L (ref 135–145)

## 2020-12-16 LAB — HEMOGLOBIN A1C
Hgb A1c MFr Bld: 5.6 % (ref 4.8–5.6)
Mean Plasma Glucose: 114.02 mg/dL

## 2020-12-16 MED ORDER — CHLORHEXIDINE GLUCONATE CLOTH 2 % EX PADS
6.0000 | MEDICATED_PAD | Freq: Every day | CUTANEOUS | Status: DC
  Administered 2020-12-16 – 2020-12-19 (×4): 6 via TOPICAL

## 2020-12-16 NOTE — Progress Notes (Addendum)
PROGRESS NOTE    Todd Dunn  GQQ:[id number] DOB: [birth year] DOA: 12/14/2020 PCP: Pcp, No  Brief Narrative:Todd Dunn is an 80 year old male with history of CVA with residual left hemiplegia, dysphagia and dysarthria, BPH, neurogenic bladder with chronic Foley, SSS status post PPM, recurrent UTIs was brought to the ED with lower abdominal discomfort, change in color, cloudy urine and worsening mental status. -In the ED his urinalysis was abnormal, Turbid appearing with positive nitrite, leukoesterase, greater than 50 WBCs, on CT-Foley catheter is noted with the balloon in the prosthetic urethra, this was advanced -Started on meropenem for history of ESBL UTIs and admitted  Assessment & Plan:   Catheter associated UTI -Foley catheter was last changed on 7/4,, balloon was noted to be in the prostatic urethra on imaging and this was advanced -History of recurrent resistant infections including ESBL, continue meropenem -Urine culture with >100k colonies of Proteus, sensitivities -Ambulate, PT OT eval   Metabolic encephalopathy -Likely secondary to above, improving, CT head unremarkable   Neurogenic bladder, chronic Foley -Foley catheter advanced so balloon in the bladder now   History of CVA -With residual left hemiplegia, dysarthria and dysphagia -Will change diet to pured, dysphagia diet -Continue Plavix -per family he's mostly wheel chair bound   Hypertension -Continue amlodipine, lisinopril -Imdur and hydralazine on hold   Hyperglycemia - hba1c is 5.6   Chronic anemia -stable, monitor   DVT prophylaxis: Lovenox Code Status: Full code Family Communication: No family at bedside, updated granddaughter Disposition Plan:  Status is: Inpatient  Remains inpatient appropriate because:Inpatient level of care appropriate due to severity of illness  Dispo: The patient is from: Home              Anticipated d/c is to: Home              Patient currently is not  medically stable to d/c.   Difficult to place patient No    Procedures:   Antimicrobials:    Subjective: -Feels okay, no specific complaints, no events overnight  Objective: Vitals:   12/16/20 0009 12/16/20 0439 12/16/20 0743 12/16/20 1143  BP: (!) 158/75 124/61 132/64 111/70  Pulse: 63 66 62 93  Resp: 15 16 18 17   Temp: 97.8 F (36.6 C) 97.9 F (36.6 C) 97.6 F (36.4 C) 97.8 F (36.6 C)  TempSrc: Axillary Axillary Oral Oral  SpO2: 100% 100% 100% 100%  Weight:      Height:        Intake/Output Summary (Last 24 hours) at 12/16/2020 1337 Last data filed at 12/16/2020 0900 Gross per 24 hour  Intake 1240 ml  Output 1150 ml  Net 90 ml   Filed Weights   12/14/20 2248 12/15/20 2248  Weight: 56.7 kg 62.5 kg    Examination:  General exam: Chronically ill elderly male with cognitive deficits, awake alert, resting comfortably, dysarthric HEENT: No JVD CVS: S1-S2, regular rate rhythm Lungs: Clear bilaterally Abdomen: Soft, nontender, bowel sounds present Extremities: No edema GU: Foley catheter noted Neuro: Left hemiplegia, dysarthria  Psychiatry: poor insight and judgment   Data Reviewed:   CBC: Recent Labs  Lab 12/14/20 2245 12/15/20 0446 12/16/20 0225  WBC 7.6 5.7 4.2  NEUTROABS 5.7  --   --   HGB 9.8* 9.1* 9.6*  HCT 33.1* 30.3* 31.5*  MCV 83.6 83.0 81.6  PLT 238 179 671   Basic Metabolic Panel: Recent Labs  Lab 12/14/20 2245 12/15/20 0446 12/16/20 0225  NA 142 138 139  K 3.9 3.4*  4.0  CL 107 106 107  CO2 26 26 29   GLUCOSE 144* 128* 114*  BUN 23 20 14   CREATININE 1.02 0.82 0.64  CALCIUM 10.3 9.4 9.5   GFR: Estimated Creatinine Clearance: 64 mL/min (by C-G formula based on SCr of 0.64 mg/dL). Liver Function Tests: Recent Labs  Lab 12/15/20 0446  AST 13*  ALT 10  ALKPHOS 111  BILITOT 0.6  PROT 6.3*  ALBUMIN 2.9*   No results for input(s): LIPASE, AMYLASE in the last 168 hours. No results for input(s): AMMONIA in the last 168  hours. Coagulation Profile: No results for input(s): INR, PROTIME in the last 168 hours. Cardiac Enzymes: No results for input(s): CKTOTAL, CKMB, CKMBINDEX, TROPONINI in the last 168 hours. BNP (last 3 results) No results for input(s): PROBNP in the last 8760 hours. HbA1C: Recent Labs    12/16/20 0225  HGBA1C 5.6   CBG: No results for input(s): GLUCAP in the last 168 hours. Lipid Profile: No results for input(s): CHOL, HDL, LDLCALC, TRIG, CHOLHDL, LDLDIRECT in the last 72 hours. Thyroid Function Tests: No results for input(s): TSH, T4TOTAL, FREET4, T3FREE, THYROIDAB in the last 72 hours. Anemia Panel: No results for input(s): VITAMINB12, FOLATE, FERRITIN, TIBC, IRON, RETICCTPCT in the last 72 hours. Urine analysis:    Component Value Date/Time   COLORURINE YELLOW 12/15/2020 0117   APPEARANCEUR TURBID (A) 12/15/2020 0117   LABSPEC 1.014 12/15/2020 0117   PHURINE 8.0 12/15/2020 0117   GLUCOSEU NEGATIVE 12/15/2020 0117   HGBUR MODERATE (A) 12/15/2020 0117   BILIRUBINUR NEGATIVE 12/15/2020 0117   KETONESUR NEGATIVE 12/15/2020 0117   PROTEINUR 100 (A) 12/15/2020 0117   NITRITE POSITIVE (A) 12/15/2020 0117   LEUKOCYTESUR LARGE (A) 12/15/2020 0117   Sepsis Labs: @LABRCNTIP (procalcitonin:4,lacticidven:4)  ) Recent Results (from the past 240 hour(s))  Urine culture     Status: Abnormal   Collection Time: 12/08/20  2:17 PM   Specimen: Urine, Random  Result Value Ref Range Status   Specimen Description URINE, RANDOM  Final   Special Requests   Final    NONE Performed at [facility], [place] [address], [place], [place] [address]    Culture >=100,000 COLONIES/mL PROTEUS MIRABILIS (A)  Final   Report Status 12/11/2020 FINAL  Final   Organism ID, Bacteria PROTEUS MIRABILIS (A)  Final      Susceptibility   Proteus mirabilis - MIC*    AMPICILLIN RESISTANT Resistant     CEFAZOLIN >=64 RESISTANT Resistant     CEFEPIME 1 SENSITIVE Sensitive     CEFTRIAXONE 8 RESISTANT  Resistant     CIPROFLOXACIN >=4 RESISTANT Resistant     GENTAMICIN 8 INTERMEDIATE Intermediate     IMIPENEM 2 SENSITIVE Sensitive     NITROFURANTOIN 256 RESISTANT Resistant     TRIMETH/SULFA >=320 RESISTANT Resistant     AMPICILLIN/SULBACTAM 4 SENSITIVE Sensitive     PIP/TAZO <=4 SENSITIVE Sensitive     * >=100,000 COLONIES/mL PROTEUS MIRABILIS  Urine culture     Status: Abnormal (Preliminary result)   Collection Time: 12/14/20  1:48 AM   Specimen: Urine, Random  Result Value Ref Range Status   Specimen Description URINE, RANDOM  Final   Special Requests Added 0149 12/15/2020  Final   Culture (A)  Final    >=100,000 COLONIES/mL PROTEUS MIRABILIS SUSCEPTIBILITIES TO FOLLOW Performed at [facility], [address] [place], [place] [address]    Report Status PENDING  Incomplete  Resp Panel by RT-PCR (Flu A&B, Covid) Nasopharyngeal Swab  Status: None   Collection Time: 12/15/20  1:10 AM   Specimen: Nasopharyngeal Swab; Nasopharyngeal(NP) swabs in vial transport medium  Result Value Ref Range Status   SARS Coronavirus 2 by RT PCR NEGATIVE NEGATIVE Final    Comment: (NOTE) SARS-CoV-2 target nucleic acids are NOT DETECTED.  The SARS-CoV-2 RNA is generally detectable in upper respiratory specimens during the acute phase of infection. The lowest concentration of SARS-CoV-2 viral copies this assay can detect is 138 copies/mL. A negative result does not preclude SARS-Cov-2 infection and should not be used as the sole basis for treatment or other patient management decisions. A negative result may occur with  improper specimen collection/handling, submission of specimen other than nasopharyngeal swab, presence of viral mutation(s) within the areas targeted by this assay, and inadequate number of viral copies(<138 copies/mL). A negative result must be combined with clinical observations, patient history, and epidemiological information. The expected result is Negative.  Fact  Sheet for Patients:  EntrepreneurPulse.com.au  Fact Sheet for Healthcare Providers:  IncredibleEmployment.be  This test is no t yet approved or cleared by the [place] FDA and  has been authorized for detection and/or diagnosis of SARS-CoV-2 by FDA under an Emergency Use Authorization (EUA). This EUA will remain  in effect (meaning this test can be used) for the duration of the COVID-19 declaration under Section 564(b)(1) of the Act, 21 U.S.C.section 360bbb-3(b)(1), unless the authorization is terminated  or revoked sooner.       Influenza A by PCR NEGATIVE NEGATIVE Final   Influenza B by PCR NEGATIVE NEGATIVE Final    Comment: (NOTE) The Xpert Xpress SARS-CoV-2/FLU/RSV plus assay is intended as an aid in the diagnosis of influenza from Nasopharyngeal swab specimens and should not be used as a sole basis for treatment. Nasal washings and aspirates are unacceptable for Xpert Xpress SARS-CoV-2/FLU/RSV testing.  Fact Sheet for Patients: EntrepreneurPulse.com.au  Fact Sheet for Healthcare Providers: IncredibleEmployment.be  This test is not yet approved or cleared by the [place] FDA and has been authorized for detection and/or diagnosis of SARS-CoV-2 by FDA under an Emergency Use Authorization (EUA). This EUA will remain in effect (meaning this test can be used) for the duration of the COVID-19 declaration under Section 564(b)(1) of the Act, 21 U.S.C. section 360bbb-3(b)(1), unless the authorization is terminated or revoked.  Performed at [facility], [place] [address], [place], [place] [address]          Radiology Studies: CT Head Wo Contrast  Result Date: 12/15/2020 CLINICAL DATA:  80 year old male with delirium. EXAM: CT HEAD WITHOUT CONTRAST TECHNIQUE: Contiguous axial images were obtained from the base of the skull through the vertex without intravenous contrast. COMPARISON:  Head  CT dated 12/08/2020. FINDINGS: Brain: Moderate age-related atrophy and chronic microvascular ischemic changes. There is no acute intracranial hemorrhage. No mass effect midline shift. No extra-axial fluid collection. Vascular: No hyperdense vessel or unexpected calcification. Skull: Normal. Negative for fracture or focal lesion. Sinuses/Orbits: No acute finding. Other: Small left supraorbital/forehead lipoma. IMPRESSION: 1. No acute intracranial pathology. 2. Moderate age-related atrophy and chronic microvascular ischemic changes. Electronically Signed   By: Anner Crete M.D.   On: 12/15/2020 02:41   DG Chest Portable 1 View  Result Date: 12/15/2020 CLINICAL DATA:  Altered mental status EXAM: PORTABLE CHEST 1 VIEW COMPARISON:  11/11/2020 FINDINGS: Cardiac shadow is mildly enlarged but stable. Defibrillator is again seen and stable. Postsurgical changes are noted. Lungs are clear bilaterally. No focal infiltrate or effusion is noted. IMPRESSION:  No acute abnormality noted. Electronically Signed   By: Inez Catalina M.D.   On: 12/15/2020 01:24   CT Renal Stone Study  Result Date: 12/15/2020 CLINICAL DATA:  Flank pain and history of diarrhea EXAM: CT ABDOMEN AND PELVIS WITHOUT CONTRAST TECHNIQUE: Multidetector CT imaging of the abdomen and pelvis was performed following the standard protocol without IV contrast. COMPARISON:  11/11/2020 FINDINGS: Lower chest: No acute abnormality. Hepatobiliary: No focal liver abnormality is seen. No gallstones, gallbladder wall thickening, or biliary dilatation. Pancreas: Unremarkable. No pancreatic ductal dilatation or surrounding inflammatory changes. Spleen: Normal in size without focal abnormality. Adrenals/Urinary Tract: Right adrenal gland is within normal limits. Somewhat rounded appearance to the left adrenal gland is noted but stable. Nonobstructing right renal stone is noted in the upper pole measuring 3 mm. Vascular calcifications are seen. Bladder is well  distended. Foley catheter is noted although is not in adequate position. The Foley balloon catheter is now noted within the prosthetic urethra. This should be deflated and advanced deeper into the bladder. Some calcification is noted along the posterior wall of the urinary bladder. This may represent a small bladder calculus but was not present on the prior exam. Could also represent recently passed calculi. Stomach/Bowel: Colon is well visualized and within normal limits. No inflammatory changes are seen. Stomach and small bowel are unremarkable. The appendix is not well visualized although no inflammatory changes to suggest appendicitis are seen. Vascular/Lymphatic: Aortic atherosclerosis. No enlarged abdominal or pelvic lymph nodes. Reproductive: Prostate is within normal limits although the Foley catheter is misplaced with the balloon in the prosthetic urethra. Other: No abdominal wall hernia or abnormality. No abdominopelvic ascites. Musculoskeletal: No acute or significant osseous findings. IMPRESSION: Foley catheter is noted with the balloon in the prosthetic urethra. This should be deflated and readvanced into the bladder. No findings to suggest colovesical fistula. Dependent calcification within the bladder which may represent an early bladder stone or possibly recently passed calculus. Prominent left adrenal gland stable in appearance from the prior exam. Nonobstructing right renal stone also stable from the prior study. Electronically Signed   By: Inez Catalina M.D.   On: 12/15/2020 02:45        Scheduled Meds:  amLODipine  5 mg Oral Daily   Chlorhexidine Gluconate Cloth  6 each Topical Daily   citalopram  20 mg Oral Daily   clopidogrel  75 mg Oral Daily   enoxaparin (LOVENOX) injection  40 mg Subcutaneous Q24H   finasteride  5 mg Oral Daily   lisinopril  10 mg Oral Daily   Continuous Infusions:  lactated ringers Stopped (12/15/20 1840)   meropenem (MERREM) IV 1 g (12/16/20 0615)      LOS: 1 day    Time spent: 61min  Domenic Polite, MD [facility]   12/16/2020, 1:37 PM

## 2020-12-16 NOTE — Care Management (Signed)
Notified by Ramond Marrow at [facility] that patient is active for services prior to admission

## 2020-12-17 LAB — URINE CULTURE: Culture: >=100000 — AB

## 2020-12-17 LAB — PROCALCITONIN: Procalcitonin: <0.1 ng/mL

## 2020-12-17 NOTE — Evaluation (Signed)
Physical Therapy Evaluation Patient Details Name: Todd Dunn MRN: [id number] DOB: [birth year] Today's Date: 12/17/2020   History of Present Illness  80 year old male brought to the ED on 12/14/20 with lower abdominal discomfort, change in color, cloudy urine and worsening mental status.  Pt dx with catheter associated UTI (neurogentic bladder, chronic foley), metabolic encephalopathy. History of CVA with residual left hemiplegia, dysphagia and dysarthria, BPH, neurogenic bladder with chronic Foley, SSS status post PPM, recurrent UTIs was  Clinical Impression  Pt is quite impulsive, reaching for stability surfaces in standing outside of the device in front of him.  At times speech is nonsensical, however, it seems this is baseline.  He is mobile at a min assist level, but would need hands on assist for standing and transfers, could likely be supervision from a WC level.  Will need to confirm family can provide care at discharge and he would benefit from follow up therapy at home.   PT to follow acutely for deficits listed below.       Follow Up Recommendations Home health PT;Supervision/Assistance - 24 hour    Equipment Recommendations  None recommended by PT    Recommendations for Other Services       Precautions / Restrictions Precautions Precautions: Fall Precaution Comments: indwelling foley      Mobility  Bed Mobility Overal bed mobility: Needs Assistance Bed Mobility: Supine to Sit     Supine to sit: Supervision     General bed mobility comments: Pt is OOB in the recliner chair.    Transfers Overall transfer level: Needs assistance   Transfers: Sit to/from Stand Sit to Stand: Min assist   Squat pivot transfers: Min assist     General transfer comment: Min assist to stand multiple times from recliner chair to backwards chair, stedy standing frame seat, and recliner to stedy standing frame.  Reaching outside of frame for bed rails and attempting to push chair  forward like a walker.  Ambulation/Gait             General Gait Details: would need a second person to do so safely.  Stairs            Wheelchair Mobility    Modified Rankin (Stroke Patients Only)       Balance Overall balance assessment: Needs assistance Sitting-balance support: Feet supported;No upper extremity supported Sitting balance-Leahy Scale: Fair     Standing balance support: Bilateral upper extremity supported;Single extremity supported Standing balance-Leahy Scale: Poor Standing balance comment: needs external support in standing.                             Pertinent Vitals/Pain Pain Assessment: No/denies pain    Home Living Family/patient expects to be discharged to:: Private residence Living Arrangements: Other relatives Available Help at Discharge: Family;Available 24 hours/day Type of Home: House Home Access: Stairs to enter   [employer] of Steps: 2 Home Layout: One level Home Equipment: Walker - 2 wheels;Shower seat;Wheelchair - manual;Grab bars - tub/shower      Prior Function Level of Independence: Needs assistance   Gait / Transfers Assistance Needed: primarily uses wc; pt able to push his wc  ADL's / Homemaking Assistance Needed: family assists with bathing and dressing        Hand Dominance   Dominant Hand: Right    Extremity/Trunk Assessment   Upper Extremity Assessment Upper Extremity Assessment: Defer to OT evaluation LUE Deficits / Details: apparent sensory  motor impariment LUE Coordination: decreased fine motor;decreased gross motor    Lower Extremity Assessment Lower Extremity Assessment: RLE deficits/detail;LLE deficits/detail RLE Deficits / Details: bil LE weakness, difficulty assessing true strength, but left leg did not seem to move as briskly as R leg when asked to attempt LE movement, also scissored legs when asked to raise one at a time straight up in the air. LLE Deficits /  Details: bil LE weakness, difficulty assessing true strength, but left leg did not seem to move as briskly as R leg when asked to attempt LE movement, also scissored legs when asked to raise one at a time straight up in the air.    Cervical / Trunk Assessment Cervical / Trunk Assessment: Other exceptions (hemiparetic)  Communication   Communication: Expressive difficulties  Cognition Arousal/Alertness: Awake/alert Behavior During Therapy: Impulsive Overall Cognitive Status: Impaired/Different from baseline Area of Impairment: Orientation;Attention;Memory;Awareness;Safety/judgement;Problem solving                 Orientation Level: Disoriented to Current Attention Level: Sustained Memory: Decreased short-term memory   Safety/Judgement: Decreased awareness of safety;Decreased awareness of deficits Awareness: Intellectual Problem Solving: Slow processing General Comments: Unsure of baseline given h/o CVA.  Very impulsive, decreased awareness of his deficits, climbing over standing frame to attempt to sit in the chair.      General Comments      Exercises     Assessment/Plan    PT Assessment Patient needs continued PT services  PT Problem List Decreased strength;Decreased activity tolerance;Decreased balance;Decreased mobility;Decreased cognition;Decreased coordination;Decreased knowledge of use of DME;Decreased safety awareness;Decreased knowledge of precautions       PT Treatment Interventions DME instruction;Gait training;Stair training;Functional mobility training;Therapeutic activities;Therapeutic exercise;Balance training;Patient/family education;Wheelchair mobility training    PT Goals (Current goals can be found in the Care Plan section)  Acute Rehab PT Goals Patient Stated Goal: to go home PT Goal Formulation: With patient Time For Goal Achievement: 12/31/20 Potential to Achieve Goals: Good    Frequency Min 3X/week   Barriers to discharge         Co-evaluation   Reason for Co-Treatment: For patient/therapist safety     SLP goals addressed during session: Swallowing     AM-PAC PT "6 Clicks" Mobility  Outcome Measure Help needed turning from your back to your side while in a flat bed without using bedrails?: A Little Help needed moving from lying on your back to sitting on the side of a flat bed without using bedrails?: A Little Help needed moving to and from a bed to a chair (including a wheelchair)?: A Little Help needed standing up from a chair using your arms (e.g., wheelchair or bedside chair)?: A Little Help needed to walk in hospital room?: A Lot Help needed climbing 3-5 steps with a railing? : Total 6 Click Score: 15    End of Session   Activity Tolerance: Patient tolerated treatment well Patient left: in chair;with call bell/phone within reach;with chair alarm set   PT Visit Diagnosis: Muscle weakness (generalized) (M62.81);Difficulty in walking, not elsewhere classified (R26.2)    Time: 7412-8786 PT Time Calculation (min) (ACUTE ONLY): 18 min   Charges:   PT Evaluation $PT Eval Moderate Complexity: 1 Mod          Verdene Lennert, PT, DPT  Acute Rehabilitation Ortho Tech Supervisor [phone] pager [phone]) [phone] office

## 2020-12-17 NOTE — Evaluation (Addendum)
Clinical/Bedside Swallow Evaluation Patient Details  Name: Todd Dunn MRN: [id number] Date of Birth: 19-Jun-1940  Today's Date: 12/17/2020 Time: SLP Start Time (ACUTE ONLY): 1206 SLP Stop Time (ACUTE ONLY): 1228 SLP Time Calculation (min) (ACUTE ONLY): 22 min  Past Medical History:  Past Medical History:  Diagnosis Date   BPH (benign prostatic hyperplasia)    CAD (coronary artery disease)    Chronic indwelling Foley catheter    CVA (cerebral vascular accident) ([place])    HTN (hypertension)    Neurogenic bladder    SSS (sick sinus syndrome) ([place])    Past Surgical History:  Past Surgical History:  Procedure Laterality Date   APPENDECTOMY     CARDIAC SURGERY     PACEMAKER PLACEMENT     HPI:  Todd Dunn is a 80 y.o. male with medical history significant of stroke, HTN, BPH, SSS s/p PPM, neurogenic bladder.     Pt has chronic indwelling foley which has been complicated by recurrent UTIs over the past couple of months.  Further complicated by resistant organisms (multiple cultures of ESBLs and a very resistant proteus, also mention of pseudomonas at some point according to hospital documentation from [facility] system when he was admitted in May).     Pt brought in to ED by granddaughter.  Reportedly concern for AMS and UTI. CT head on 12/15/20 indicated Moderate age-related atrophy and chronic microvascular ischemic Changes; pt currently on Dysphagia 1/thin liquid diet.  BSE ordered to assess swallow function.  Assessment / Plan / Recommendation Clinical Impression  Pt seen for clinical swallowing evaluation with cognitive-based dysphagia noted with impaired mastication only with solids with prolonged oral manipulation observed.  Pt did not exhibit any overt s/s of aspiration, but d/t cognitive changes, attention during PO consumption may be a concern as pt was talking while eating with min cues required for redirection to halt speaking during PO intake.  Thin via cup/straw and puree  WFL.  Recommend initiating a Dysphagia 3/thin liquid diet with FULL precautions for swallowing strategies during meals/snacks d/t inattention/cognitive changes until pt returns to baseline level of functioning.  ST will f/u x1 for diet tolerance during acute stay.  Thank you for this consult. SLP Visit Diagnosis: Dysphagia, unspecified (R13.10)    Aspiration Risk  Mild aspiration risk    Diet Recommendation    Dysphagia 3/thin liquids Medication Administration: Whole meds with puree    Other  Recommendations Oral Care Recommendations: Oral care BID   Follow up Recommendations Other (comment) (TBD)      Frequency and Duration min 1 x/week  1 week       Prognosis Prognosis for Safe Diet Advancement: Good Barriers to Reach Goals: Cognitive deficits      Swallow Study   General Date of Onset: 12/14/20 HPI: Todd Dunn is a 80 y.o. male with medical history significant of stroke, HTN, BPH, SSS s/p PPM, neurogenic bladder.     Pt has chronic indwelling foley which has been complicated by recurrent UTIs over the past couple of months.  Further complicated by resistant organisms (multiple cultures of ESBLs and a very resistant proteus, also mention of pseudomonas at some point according to hospital documentation from [facility] system when he was admitted in May).     Pt brought in to ED by granddaughter.  Reportedly concern for AMS and UTI. Type of Study: Bedside Swallow Evaluation Previous Swallow Assessment: n/a Diet Prior to this Study: Dysphagia 1 (puree);Thin liquids Temperature Spikes Noted: No Respiratory Status: Room  air History of Recent Intubation: No Behavior/Cognition: Alert;Distractible;Requires cueing;Confused Oral Cavity Assessment: Within Functional Limits Oral Care Completed by SLP: Other (Comment) (Pt completed with A from SLP) Oral Cavity - Dentition: Adequate natural dentition Vision: Functional for self-feeding Self-Feeding Abilities: Able to feed self;Needs set  up Patient Positioning: Upright in chair Baseline Vocal Quality: Low vocal intensity Volitional Cough: Strong Volitional Swallow: Able to elicit    Oral/Motor/Sensory Function Overall Oral Motor/Sensory Function: Within functional limits   Ice Chips Ice chips: Within functional limits Presentation: Spoon   Thin Liquid Thin Liquid: Within functional limits Presentation: Straw;Cup    Nectar Thick Nectar Thick Liquid: Not tested   Honey Thick Honey Thick Liquid: Not tested   Puree Puree: Within functional limits Presentation: Self Fed   Solid     Solid: Impaired Presentation: Self Fed Oral Phase Impairments: Impaired mastication Oral Phase Functional Implications: Impaired mastication      Elvina Sidle, M.S., CCC-SLP 12/17/2020,2:51 PM

## 2020-12-17 NOTE — Plan of Care (Signed)
  Problem: Education: Goal: Knowledge of General Education information will improve Description Including pain rating scale, medication(s)/side effects and non-pharmacologic comfort measures Outcome: Progressing   Problem: Health Behavior/Discharge Planning: Goal: Ability to manage health-related needs will improve Outcome: Progressing   

## 2020-12-17 NOTE — Progress Notes (Signed)
PROGRESS NOTE    KAPIL PETROPOULOS  BZJ:[id number] DOB: 1941/01/02 DOA: 12/14/2020 PCP: Pcp, No   Brief Narrative:Mr. Nou is an 80 year old male with history of CVA with residual left hemiplegia, dysphagia and dysarthria, BPH, neurogenic bladder with chronic Foley, SSS status post PPM, recurrent UTIs was brought to the ED with lower abdominal discomfort, change in color, cloudy urine and worsening mental status. -In the ED his urinalysis was abnormal, Turbid appearing with positive nitrite, leukoesterase, greater than 50 WBCs, on CT-Foley catheter is noted with the balloon in the prosthetic urethra, this was advanced -Started on meropenem for history of ESBL UTIs and admitted  Assessment & Plan:   Catheter associated UTI -Foley catheter was last changed on 7/4,, balloon was noted to be in the prostatic urethra on imaging and this was advanced -History of recurrent resistant infections including ESBL, continue meropenem -Urine culture with >100k colonies of Proteus, continue with meropenem -Ambulate, PT OT eval -We will add procalcitonin to morning labs   Metabolic encephalopathy -Likely secondary to above, improving, CT head unremarkable   Neurogenic bladder, chronic Foley -Foley catheter advanced so balloon in the bladder now   History of CVA -With residual left hemiplegia, dysarthria and dysphagia -Will change diet to pured, dysphagia diet -Continue Plavix -per family he's mostly wheel chair bound   Hypertension -Continue amlodipine, lisinopril -Blood pressure remains acceptable, continue to hold Imdur and hydralazine .   Hyperglycemia - hba1c is 5.6   Chronic anemia -stable, monitor   DVT prophylaxis: Lovenox Code Status: Full code Family Communication: No family at bedside Disposition Plan:  Status is: Inpatient  Remains inpatient appropriate because:Inpatient level of care appropriate due to severity of illness  Dispo: The patient is from: Home               Anticipated d/c is to: Home              Patient currently is not medically stable to d/c.   Difficult to place patient No    Procedures:   Antimicrobials:    Subjective:  Patient himself denies any complaints, no significant events as discussed with staff Objective: Vitals:   12/17/20 0016 12/17/20 0428 12/17/20 0801 12/17/20 1157  BP: (!) 145/66 (!) 140/52  132/77  Pulse: 64 65    Resp: 15 17 18    Temp: 98 F (36.7 C) 98.3 F (36.8 C) 97.6 F (36.4 C) 98.2 F (36.8 C)  TempSrc: Axillary Axillary Oral Oral  SpO2: 99% 100%    Weight:      Height:        Intake/Output Summary (Last 24 hours) at 12/17/2020 1306 Last data filed at 12/16/2020 2200 Gross per 24 hour  Intake 540 ml  Output 2650 ml  Net -2110 ml   Filed Weights   12/14/20 2248 12/15/20 2248  Weight: 56.7 kg 62.5 kg    Examination:   Awake Alert, frail, deconditioned, with impaired cognition and insight, left-sided hemiplegia Symmetrical Chest wall movement, Good air movement bilaterally, CTAB RRR,No Gallops,Rubs or new Murmurs, No Parasternal Heave +ve B.Sounds, Abd Soft, No tenderness, No rebound - guarding or rigidity. No Cyanosis, Clubbing or edema, No new Rash or bruise      Data Reviewed:   CBC: Recent Labs  Lab 12/14/20 2245 12/15/20 0446 12/16/20 0225  WBC 7.6 5.7 4.2  NEUTROABS 5.7  --   --   HGB 9.8* 9.1* 9.6*  HCT 33.1* 30.3* 31.5*  MCV 83.6 83.0 81.6  PLT 238 179  785   Basic Metabolic Panel: Recent Labs  Lab 12/14/20 2245 12/15/20 0446 12/16/20 0225  NA 142 138 139  K 3.9 3.4* 4.0  CL 107 106 107  CO2 26 26 29   GLUCOSE 144* 128* 114*  BUN 23 20 14   CREATININE 1.02 0.82 0.64  CALCIUM 10.3 9.4 9.5   GFR: Estimated Creatinine Clearance: 64 mL/min (by C-G formula based on SCr of 0.64 mg/dL). Liver Function Tests: Recent Labs  Lab 12/15/20 0446  AST 13*  ALT 10  ALKPHOS 111  BILITOT 0.6  PROT 6.3*  ALBUMIN 2.9*   No results for input(s): LIPASE, AMYLASE  in the last 168 hours. No results for input(s): AMMONIA in the last 168 hours. Coagulation Profile: No results for input(s): INR, PROTIME in the last 168 hours. Cardiac Enzymes: No results for input(s): CKTOTAL, CKMB, CKMBINDEX, TROPONINI in the last 168 hours. BNP (last 3 results) No results for input(s): PROBNP in the last 8760 hours. HbA1C: Recent Labs    12/16/20 0225  HGBA1C 5.6   CBG: No results for input(s): GLUCAP in the last 168 hours. Lipid Profile: No results for input(s): CHOL, HDL, LDLCALC, TRIG, CHOLHDL, LDLDIRECT in the last 72 hours. Thyroid Function Tests: No results for input(s): TSH, T4TOTAL, FREET4, T3FREE, THYROIDAB in the last 72 hours. Anemia Panel: No results for input(s): VITAMINB12, FOLATE, FERRITIN, TIBC, IRON, RETICCTPCT in the last 72 hours. Urine analysis:    Component Value Date/Time   COLORURINE YELLOW 12/15/2020 0117   APPEARANCEUR TURBID (A) 12/15/2020 0117   LABSPEC 1.014 12/15/2020 0117   PHURINE 8.0 12/15/2020 0117   GLUCOSEU NEGATIVE 12/15/2020 0117   HGBUR MODERATE (A) 12/15/2020 0117   BILIRUBINUR NEGATIVE 12/15/2020 0117   KETONESUR NEGATIVE 12/15/2020 0117   PROTEINUR 100 (A) 12/15/2020 0117   NITRITE POSITIVE (A) 12/15/2020 0117   LEUKOCYTESUR LARGE (A) 12/15/2020 0117   Sepsis Labs: @LABRCNTIP (procalcitonin:4,lacticidven:4)  ) Recent Results (from the past 240 hour(s))  Urine culture     Status: Abnormal   Collection Time: 12/08/20  2:17 PM   Specimen: Urine, Random  Result Value Ref Range Status   Specimen Description URINE, RANDOM  Final   Special Requests   Final    NONE Performed at [facility], [place] [address] [place], [place] [address]    Culture >=100,000 COLONIES/mL PROTEUS MIRABILIS (A)  Final   Report Status 12/11/2020 FINAL  Final   Organism ID, Bacteria PROTEUS MIRABILIS (A)  Final      Susceptibility   Proteus mirabilis - MIC*    AMPICILLIN RESISTANT Resistant     CEFAZOLIN >=64 RESISTANT  Resistant     CEFEPIME 1 SENSITIVE Sensitive     CEFTRIAXONE 8 RESISTANT Resistant     CIPROFLOXACIN >=4 RESISTANT Resistant     GENTAMICIN 8 INTERMEDIATE Intermediate     IMIPENEM 2 SENSITIVE Sensitive     NITROFURANTOIN 256 RESISTANT Resistant     TRIMETH/SULFA >=320 RESISTANT Resistant     AMPICILLIN/SULBACTAM 4 SENSITIVE Sensitive     PIP/TAZO <=4 SENSITIVE Sensitive     * >=100,000 COLONIES/mL PROTEUS MIRABILIS  Urine culture     Status: Abnormal   Collection Time: 12/14/20  1:48 AM   Specimen: Urine, Random  Result Value Ref Range Status   Specimen Description URINE, RANDOM  Final   Special Requests   Final    Added 0149 12/15/2020 Performed at [facility], [place] [address], [place], [place] [address]    Culture >=100,000 COLONIES/mL PROTEUS  MIRABILIS (A)  Final   Report Status 12/17/2020 FINAL  Final   Organism ID, Bacteria PROTEUS MIRABILIS (A)  Final      Susceptibility   Proteus mirabilis - MIC*    AMPICILLIN 8 SENSITIVE Sensitive     CEFAZOLIN 16 SENSITIVE Sensitive     CEFEPIME <=0.12 SENSITIVE Sensitive     CEFTRIAXONE <=0.25 SENSITIVE Sensitive     CIPROFLOXACIN >=4 RESISTANT Resistant     GENTAMICIN >=16 RESISTANT Resistant     IMIPENEM 2 SENSITIVE Sensitive     NITROFURANTOIN 128 RESISTANT Resistant     TRIMETH/SULFA >=320 RESISTANT Resistant     AMPICILLIN/SULBACTAM 8 SENSITIVE Sensitive     PIP/TAZO <=4 SENSITIVE Sensitive     * >=100,000 COLONIES/mL PROTEUS MIRABILIS  Resp Panel by RT-PCR (Flu A&B, Covid) Nasopharyngeal Swab     Status: None   Collection Time: 12/15/20  1:10 AM   Specimen: Nasopharyngeal Swab; Nasopharyngeal(NP) swabs in vial transport medium  Result Value Ref Range Status   SARS Coronavirus 2 by RT PCR NEGATIVE NEGATIVE Final    Comment: (NOTE) SARS-CoV-2 target nucleic acids are NOT DETECTED.  The SARS-CoV-2 RNA is generally detectable in upper respiratory specimens during the acute phase of infection. The lowest concentration  of SARS-CoV-2 viral copies this assay can detect is 138 copies/mL. A negative result does not preclude SARS-Cov-2 infection and should not be used as the sole basis for treatment or other patient management decisions. A negative result may occur with  improper specimen collection/handling, submission of specimen other than nasopharyngeal swab, presence of viral mutation(s) within the areas targeted by this assay, and inadequate number of viral copies(<138 copies/mL). A negative result must be combined with clinical observations, patient history, and epidemiological information. The expected result is Negative.  Fact Sheet for Patients:  EntrepreneurPulse.com.au  Fact Sheet for Healthcare Providers:  IncredibleEmployment.be  This test is no t yet approved or cleared by the [place] FDA and  has been authorized for detection and/or diagnosis of SARS-CoV-2 by FDA under an Emergency Use Authorization (EUA). This EUA will remain  in effect (meaning this test can be used) for the duration of the COVID-19 declaration under Section 564(b)(1) of the Act, 21 U.S.C.section 360bbb-3(b)(1), unless the authorization is terminated  or revoked sooner.       Influenza A by PCR NEGATIVE NEGATIVE Final   Influenza B by PCR NEGATIVE NEGATIVE Final    Comment: (NOTE) The Xpert Xpress SARS-CoV-2/FLU/RSV plus assay is intended as an aid in the diagnosis of influenza from Nasopharyngeal swab specimens and should not be used as a sole basis for treatment. Nasal washings and aspirates are unacceptable for Xpert Xpress SARS-CoV-2/FLU/RSV testing.  Fact Sheet for Patients: EntrepreneurPulse.com.au  Fact Sheet for Healthcare Providers: IncredibleEmployment.be  This test is not yet approved or cleared by the [place] FDA and has been authorized for detection and/or diagnosis of SARS-CoV-2 by FDA under an Emergency Use  Authorization (EUA). This EUA will remain in effect (meaning this test can be used) for the duration of the COVID-19 declaration under Section 564(b)(1) of the Act, 21 U.S.C. section 360bbb-3(b)(1), unless the authorization is terminated or revoked.  Performed at [facility], [place] [address], [place], [place] [address]          Radiology Studies: No results found.      Scheduled Meds:  amLODipine  5 mg Oral Daily   Chlorhexidine Gluconate Cloth  6 each Topical Daily   citalopram  20 mg  Oral Daily   clopidogrel  75 mg Oral Daily   enoxaparin (LOVENOX) injection  40 mg Subcutaneous Q24H   finasteride  5 mg Oral Daily   lisinopril  10 mg Oral Daily   Continuous Infusions:  meropenem (MERREM) IV 1 g (12/17/20 0605)     LOS: 2 days    Time spent: 55min  Domenic Polite, MD [facility]   12/17/2020, 1:06 PM

## 2020-12-17 NOTE — Progress Notes (Signed)
Occupational Therapy Evaluation Patient Details Name: Todd Dunn MRN: [id number] DOB: August 08, 1940 Today's Date: 12/17/2020    History of Present Illness 80 year old male brought to the ED with lower abdominal discomfort, change in color, cloudy urine and worsening mental status. History of CVA with residual left hemiplegia, dysphagia and dysarthria, BPH, neurogenic bladder with chronic Foley, SSS status post PPM, recurrent UTIs was   Clinical Impression   PTA pt lives with his family who provides assistance with ADL and mobility. Pt mobilizes @ wc level once family helps him transfer to his chair. Family recently brought him home from [facility] in May. Pt requires Min A with squat pivot transfer to chair and mod A with ADL tasks with exception of total A for toileting. Pt with decreased awareness of deficits and is a fall risk. Attempted to use a seatbelt alarm, however pt refused. Recommend Air cabin crew or tele sitter to reduce risk of falls.  Recommend follow up with [place] after DC. [facility] family [facility]    Follow Up Recommendations  Home health OT;Supervision/Assistance - 24 hour    Equipment Recommendations  Other (comment) (family needs anti tippers for wc)    Recommendations for Other Services PT consult     Precautions / Restrictions Precautions Precautions: Fall Precaution Comments: indwelling foley Restrictions Weight Bearing Restrictions: No      Mobility Bed Mobility Overal bed mobility: Needs Assistance Bed Mobility: Supine to Sit     Supine to sit: Supervision     General bed mobility comments: use of rails    Transfers Overall transfer level: Needs assistance   Transfers: Squat Pivot Transfers     Squat pivot transfers: Min assist          Balance Overall balance assessment: Needs assistance   Sitting balance-Leahy Scale: Fair       Standing balance-Leahy Scale: Poor                              ADL either performed or assessed with clinical judgement   ADL Overall ADL's : Needs assistance/impaired Eating/Feeding: Set up;Supervision/ safety;Sitting   Grooming: Oral care;Set up;Supervision/safety   Upper Body Bathing: Minimal assistance;Sitting   Lower Body Bathing: Moderate assistance;Sit to/from stand   Upper Body Dressing : Moderate assistance;Sitting   Lower Body Dressing: Moderate assistance;Sit to/from stand   Toilet Transfer: Minimal assistance;Stand-pivot (simulated to recliner)   Toileting- Clothing Manipulation and Hygiene: Total assistance Toileting - Clothing Manipulation Details (indicate cue type and reason): foley     Functional mobility during ADLs: Minimal assistance;Cueing for safety General ADL Comments: Most likely close to baseline prior to admission     Vision         Perception Perception Comments: impaired spatial awareness   Praxis Praxis Praxis tested?: Deficits Deficits: Limb apraxia    Pertinent Vitals/Pain Pain Assessment: No/denies pain     Hand Dominance Right   Extremity/Trunk Assessment Upper Extremity Assessment Upper Extremity Assessment: LUE deficits/detail LUE Deficits / Details: apparent sensory motor impariment LUE Coordination: decreased fine motor;decreased gross motor   Lower Extremity Assessment Lower Extremity Assessment: Defer to PT evaluation   Cervical / Trunk Assessment Cervical / Trunk Assessment: Other exceptions (hemiparetic)   Communication Communication Communication: Expressive difficulties   Cognition Arousal/Alertness: Awake/alert Behavior During Therapy: Impulsive Overall Cognitive Status: Impaired/Different from baseline Area of Impairment: Orientation;Attention;Memory;Awareness;Safety/judgement;Problem solving  Orientation Level: Disoriented to Current Attention Level: Sustained Memory: Decreased short-term memory   Safety/Judgement: Decreased awareness of  safety;Decreased awareness of deficits Awareness: Intellectual Problem Solving: Slow processing     General Comments       Exercises     Shoulder Instructions      Home Living Family/patient expects to be discharged to:: Private residence Living Arrangements: Other relatives Available Help at Discharge: Family;Available 24 hours/day Type of Home: House Home Access: Stairs to enter [employer] of Steps: 2   Home Layout: One level     Bathroom Shower/Tub: Tub/shower unit;Curtain;Walk-in shower   Bathroom Toilet: Standard Bathroom Accessibility: Yes How Accessible: Accessible via wheelchair Home Equipment: [place] - 2 wheels;Shower seat;Wheelchair - manual;Grab bars - tub/shower          Prior Functioning/Environment Level of Independence: Needs assistance  Gait / Transfers Assistance Needed: primarily uses wc; pt able to push his wc ADL's / Homemaking Assistance Needed: family assists with bathing and dressing Communication / Swallowing Assistance Needed: dysarthric          OT Problem List: Decreased strength;Impaired balance (sitting and/or standing);Impaired vision/perception;Decreased coordination;Decreased cognition;Decreased safety awareness;Decreased knowledge of use of DME or AE;Decreased knowledge of precautions;Impaired UE functional use      OT Treatment/Interventions: Self-care/ADL training;Therapeutic exercise;Neuromuscular education;DME and/or AE instruction;Therapeutic activities;Cognitive remediation/compensation;Visual/perceptual remediation/compensation;Patient/family education;Balance training    OT Goals(Current goals can be found in the care plan section) Acute Rehab OT Goals Patient Stated Goal: to go home OT Goal Formulation: With patient/family Time For Goal Achievement: 12/31/20 Potential to Achieve Goals: Good  OT Frequency: Min 2X/week   Barriers to D/C:            Co-evaluation              AM-PAC OT "6 Clicks"  Daily Activity     Outcome Measure Help from another person eating meals?: A Little Help from another person taking care of personal grooming?: A Little Help from another person toileting, which includes using toliet, bedpan, or urinal?: Total Help from another person bathing (including washing, rinsing, drying)?: A Lot Help from another person to put on and taking off regular upper body clothing?: A Lot Help from another person to put on and taking off regular lower body clothing?: A Lot 6 Click Score: 13   End of Session Nurse Communication: Mobility status  Activity Tolerance: Patient tolerated treatment well Patient left: in chair;with call bell/phone within reach;with chair alarm set  OT Visit Diagnosis: Unsteadiness on feet (R26.81);Other abnormalities of gait and mobility (R26.89);Muscle weakness (generalized) (M62.81);Other symptoms and signs involving the nervous system (R29.898);Other symptoms and signs involving cognitive function                Time: 4765-4650 OT Time Calculation (min): 27 min Charges:  OT General Charges $OT Visit: 1 Visit OT Evaluation $OT Eval Moderate Complexity: [place], OT/L   Acute OT Clinical Specialist Acute Rehabilitation Services Pager [phone] Office [phone]   [facility] 12/17/2020, 12:54 PM

## 2020-12-18 MED ORDER — HYDRALAZINE HCL 50 MG PO TABS: 50.0000 mg | ORAL_TABLET | Freq: Three times a day (TID) | ORAL | Status: DC

## 2020-12-18 MED ORDER — ISOSORBIDE MONONITRATE ER 30 MG PO TB24
30.0000 mg | ORAL_TABLET | Freq: Every day | ORAL | Status: DC
  Administered 2020-12-18: 30 mg via ORAL
  Filled 2020-12-18 (×2): qty 1

## 2020-12-18 MED ORDER — HYDRALAZINE HCL 25 MG PO TABS
25.0000 mg | ORAL_TABLET | Freq: Three times a day (TID) | ORAL | Status: DC
  Administered 2020-12-18 (×2): 25 mg via ORAL
  Filled 2020-12-18 (×3): qty 1

## 2020-12-18 MED ORDER — CEPHALEXIN 500 MG PO CAPS: 500.0000 mg | ORAL_CAPSULE | Freq: Three times a day (TID) | ORAL | Status: DC

## 2020-12-18 MED ORDER — CEFDINIR 300 MG PO CAPS: 600.0000 mg | ORAL_CAPSULE | Freq: Every day | ORAL | Status: DC

## 2020-12-18 MED ORDER — ISOSORBIDE MONONITRATE ER 60 MG PO TB24: 60.0000 mg | ORAL_TABLET | Freq: Every day | ORAL | Status: DC

## 2020-12-18 MED ORDER — FOSFOMYCIN TROMETHAMINE 3 G PO PACK
3.0000 g | PACK | Freq: Once | ORAL | Status: DC
  Filled 2020-12-18: qty 3

## 2020-12-18 NOTE — TOC Initial Note (Addendum)
Transition of Care [facility]) - Initial/Assessment Note    Patient Details  Name: Todd Dunn MRN: [id number] Date of Birth: 1940-11-19  Transition of Care [facility]) CM/SW Contact:    Marilu Favre, RN Phone Number: 12/18/2020, 12:57 PM  Clinical Narrative:                 Spoke to patient's grand daughter Todd Dunn [phone] via phone. Patient from home with family providing 24/7 assistance.   Patient active with [facility], Ms Tamala Julian would like to continue with [place].  Patient has a wheel chair but needs anti tippers. Ms Tamala Julian unsure what kind ( brand) wheelchair or which agency wheel chair came from. She has ordered anti tippers from [place] [facility] but they did not fit. NCM called Freda Munro with [facility] , but w/c was not provided by them. NCM explained HHPT would be able to assist in getting the correct anti tippers ( confirmed with Ramond Marrow with [facility]). However, if Ms Tamala Julian can find the name of the agency she will call NCM back.   Ms Tamala Julian would like MD to call her regarding patient's medications, and potential discharge date .  Secured chatted MD. Ms Tamala Julian will provided transportation home at DC.  Expected Discharge Plan: [place]     Patient Goals and CMS Choice Patient states their goals for this hospitalization and ongoing recovery are:: to go home CMS Medicare.gov Compare Post Acute Care list provided to:: Patient Choice offered to / list presented to : Adult Children (grand daughter Todd Dunn [phone])  Expected Discharge Plan and Services Expected Discharge Plan: [place]   Discharge Planning Services: CM Consult Post Acute Care Choice: Home Health, Durable Medical Equipment Living arrangements for the past 2 months: Single Family Home                           HH Arranged: OT, PT, RN [facility] Agency: [facility] ([facility]) Date HH Agency Contacted: 12/18/20 Time [place]:  36 Representative spoke with at [place]: [place] Arrangements/Services Living arrangements for the past 2 months: [place] with:: Relatives              Current home services: DME    Activities of Daily Living Home Assistive Devices/Equipment: Wheelchair ADL Screening (condition at time of admission) Patient's cognitive ability adequate to safely complete daily activities?: Yes Is the patient deaf or have difficulty hearing?: No Does the patient have difficulty seeing, even when wearing glasses/contacts?: No Does the patient have difficulty concentrating, remembering, or making decisions?: Yes Patient able to express need for assistance with ADLs?: Yes Does the patient have difficulty dressing or bathing?: No Independently performs ADLs?: Yes (appropriate for developmental age) Does the patient have difficulty walking or climbing stairs?: Yes Weakness of Legs: Left Weakness of Arms/Hands: Left  Permission Sought/Granted                  Emotional Assessment              Admission diagnosis:  Lower urinary tract infectious disease [N39.0] Catheter-associated urinary tract infection ([place]) [V03.500X, N39.0] Altered mental status, unspecified altered mental status type [R41.82] Patient Active Problem List   Diagnosis Date Noted   Catheter-associated urinary tract infection ([place]) 38/18/2993   Acute metabolic encephalopathy 71/69/6789   Chronic indwelling Foley catheter 12/15/2020  HTN (hypertension) 12/15/2020   History of ESBL E. coli infection 12/15/2020   PCP:  Pcp, No Pharmacy:   [facility] [place], [place] [address] Phone: [phone] Fax: [phone]     Social Determinants of Health (SDOH) Interventions    Readmission Risk Interventions No flowsheet data found.

## 2020-12-18 NOTE — Progress Notes (Addendum)
Pharmacy Antibiotic Note  Forrester Blando Serpa is a 80 y.o. male admitted on 12/14/2020 with UTI.  Pharmacy has been consulted for meropenem dosing.  Of note pt was admitted to [facility] 25mo ago for UTI, initially started on cefepime and changed to meropenem for ESBL E.coli and Proteus; pt's hx notes a PCN allergy but pt cannot report what the rxn is, PCN allergy was documented in [facility] system 1d after Keflex was started so EDP concerned that pt could also be allergic to cephalosporins.  Culture came back with non-ESBL/likely ESBL proteus. D/w Dr Waldron Labs and he would like to continue merrem to complete 7d  Plan: Merrem 1g IV q8 until 7/18  Height: 6\' 1"  (185.4 cm) Weight: 62.5 kg (137 lb 12.6 oz) IBW/kg (Calculated) : 79.9  Temp (24hrs), Avg:98 F (36.7 C), Min:97.6 F (36.4 C), Max:98.2 F (36.8 C)  Recent Labs  Lab 12/14/20 2245 12/15/20 0446 12/16/20 0225  WBC 7.6 5.7 4.2  CREATININE 1.02 0.82 0.64     Estimated Creatinine Clearance: 64 mL/min (by C-G formula based on SCr of 0.64 mg/dL).    Allergies  Allergen Reactions   Aspirin Itching   [place] Clam (M. [place]enaria) Skin Test Itching   Penicillins     7/10 mero >7/14 7/10 Ucx > proteus pan sens except cipro, gent, nitro, septra 7/4 urine>>likely ESBL  Onnie Boer, PharmD, BCIDP, AAHIVP, CPP Infectious Disease Pharmacist 12/18/2020 9:12 AM

## 2020-12-18 NOTE — Progress Notes (Signed)
PROGRESS NOTE    Todd Dunn  WGN:[id number] DOB: 06-09-1940 DOA: 12/14/2020 PCP: Pcp, No   Brief Narrative:Todd Dunn is an 80 year old male with history of CVA with residual left hemiplegia, dysphagia and dysarthria, BPH, neurogenic bladder with chronic Foley, SSS status post PPM, recurrent UTIs was brought to the ED with lower abdominal discomfort, change in color, cloudy urine and worsening mental status. -In the ED his urinalysis was abnormal, Turbid appearing with positive nitrite, leukoesterase, greater than 50 WBCs, on CT-Foley catheter is noted with the balloon in the prosthetic urethra, this was advanced -Started on meropenem for history of ESBL UTIs and admitted  Assessment & Plan:   Catheter associated UTI -Foley catheter was last changed on 7/4, balloon was noted to be in the prostatic urethra on imaging and this was advanced -History of recurrent resistant infections including ESBL on 7/4, but this admission cultures came back for ESBL organism, so we will continue with meropenem x5 days, and then give 1 day of fosfomycin before discharge., continue meropenem -Ambulate, PT OT eval - it does appears appears he is  on Macrobid 50 mg daily chronically, apparently he ran out of antibiotics last week, so he will need refill on discharge.   Metabolic encephalopathy -Likely secondary to above, improving, CT head unremarkable   Neurogenic bladder, chronic Foley -Foley catheter advanced so balloon in the bladder now   History of CVA -With residual left hemiplegia, dysarthria and dysphagia -Will change diet to pured, dysphagia diet -Continue Plavix -per family he's mostly wheel chair bound   Hypertension -Continue amlodipine, lisinopril, started to increase, so I would resume on Imdur and hydralazine.   Hyperglycemia - hba1c is 5.6   Chronic anemia -stable, monitor   DVT prophylaxis: Lovenox Code Status: Full code Family Communication: Discussed with  granddaughter by phone Disposition Plan:  Status is: Inpatient  Remains inpatient appropriate because:Inpatient level of care appropriate due to severity of illness  Dispo: The patient is from: Home              Anticipated d/c is to: Home              Patient currently is not medically stable to d/c.   Difficult to place patient No    Procedures:   Antimicrobials:    Subjective:  Patient himself denies any complaints, no significant events as discussed with staff Objective: Vitals:   12/18/20 0000 12/18/20 0420 12/18/20 0700 12/18/20 1216  BP: 131/66 (!) 134/58 128/70 (!) 151/70  Pulse: 66 65 63 70  Resp: (!) 21 19 11 18   Temp: 98.1 F (36.7 C) 98 F (36.7 C) 97.6 F (36.4 C) (!) 97.5 F (36.4 C)  TempSrc: Oral Oral Oral Oral  SpO2: 98% 100% 98% 100%  Weight:      Height:        Intake/Output Summary (Last 24 hours) at 12/18/2020 1405 Last data filed at 12/18/2020 0900 Gross per 24 hour  Intake 120 ml  Output 2500 ml  Net -2380 ml   Filed Weights   12/14/20 2248 12/15/20 2248  Weight: 56.7 kg 62.5 kg    Examination:   Awake Alert, frail, deconditioned, with impaired cognition and insight, left-sided hemiplegia Symmetrical Chest wall movement, Good air movement bilaterally, CTAB RRR,No Gallops,Rubs or new Murmurs, No Parasternal Heave +ve B.Sounds, Abd Soft, No tenderness, No rebound - guarding or rigidity. No Cyanosis, Clubbing or edema, No new Rash or bruise       Data Reviewed:  CBC: Recent Labs  Lab 12/14/20 2245 12/15/20 0446 12/16/20 0225  WBC 7.6 5.7 4.2  NEUTROABS 5.7  --   --   HGB 9.8* 9.1* 9.6*  HCT 33.1* 30.3* 31.5*  MCV 83.6 83.0 81.6  PLT 238 179 253   Basic Metabolic Panel: Recent Labs  Lab 12/14/20 2245 12/15/20 0446 12/16/20 0225  NA 142 138 139  K 3.9 3.4* 4.0  CL 107 106 107  CO2 26 26 29   GLUCOSE 144* 128* 114*  BUN 23 20 14   CREATININE 1.02 0.82 0.64  CALCIUM 10.3 9.4 9.5   GFR: Estimated Creatinine  Clearance: 64 mL/min (by C-G formula based on SCr of 0.64 mg/dL). Liver Function Tests: Recent Labs  Lab 12/15/20 0446  AST 13*  ALT 10  ALKPHOS 111  BILITOT 0.6  PROT 6.3*  ALBUMIN 2.9*   No results for input(s): LIPASE, AMYLASE in the last 168 hours. No results for input(s): AMMONIA in the last 168 hours. Coagulation Profile: No results for input(s): INR, PROTIME in the last 168 hours. Cardiac Enzymes: No results for input(s): CKTOTAL, CKMB, CKMBINDEX, TROPONINI in the last 168 hours. BNP (last 3 results) No results for input(s): PROBNP in the last 8760 hours. HbA1C: Recent Labs    12/16/20 0225  HGBA1C 5.6   CBG: No results for input(s): GLUCAP in the last 168 hours. Lipid Profile: No results for input(s): CHOL, HDL, LDLCALC, TRIG, CHOLHDL, LDLDIRECT in the last 72 hours. Thyroid Function Tests: No results for input(s): TSH, T4TOTAL, FREET4, T3FREE, THYROIDAB in the last 72 hours. Anemia Panel: No results for input(s): VITAMINB12, FOLATE, FERRITIN, TIBC, IRON, RETICCTPCT in the last 72 hours. Urine analysis:    Component Value Date/Time   COLORURINE YELLOW 12/15/2020 0117   APPEARANCEUR TURBID (A) 12/15/2020 0117   LABSPEC 1.014 12/15/2020 0117   PHURINE 8.0 12/15/2020 0117   GLUCOSEU NEGATIVE 12/15/2020 0117   HGBUR MODERATE (A) 12/15/2020 0117   BILIRUBINUR NEGATIVE 12/15/2020 0117   KETONESUR NEGATIVE 12/15/2020 0117   PROTEINUR 100 (A) 12/15/2020 0117   NITRITE POSITIVE (A) 12/15/2020 0117   LEUKOCYTESUR LARGE (A) 12/15/2020 0117   Sepsis Labs: @LABRCNTIP (procalcitonin:4,lacticidven:4)  ) Recent Results (from the past 240 hour(s))  Urine culture     Status: Abnormal   Collection Time: 12/08/20  2:17 PM   Specimen: Urine, Random  Result Value Ref Range Status   Specimen Description URINE, RANDOM  Final   Special Requests   Final    NONE Performed at [facility], [place] [address] [place], [place] [address]    Culture >=100,000 COLONIES/mL  PROTEUS MIRABILIS (A)  Final   Report Status 12/11/2020 FINAL  Final   Organism ID, Bacteria PROTEUS MIRABILIS (A)  Final      Susceptibility   Proteus mirabilis - MIC*    AMPICILLIN RESISTANT Resistant     CEFAZOLIN >=64 RESISTANT Resistant     CEFEPIME 1 SENSITIVE Sensitive     CEFTRIAXONE 8 RESISTANT Resistant     CIPROFLOXACIN >=4 RESISTANT Resistant     GENTAMICIN 8 INTERMEDIATE Intermediate     IMIPENEM 2 SENSITIVE Sensitive     NITROFURANTOIN 256 RESISTANT Resistant     TRIMETH/SULFA >=320 RESISTANT Resistant     AMPICILLIN/SULBACTAM 4 SENSITIVE Sensitive     PIP/TAZO <=4 SENSITIVE Sensitive     * >=100,000 COLONIES/mL PROTEUS MIRABILIS  Urine culture     Status: Abnormal   Collection Time: 12/14/20  1:48 AM   Specimen: Urine, Random  Result Value  Ref Range Status   Specimen Description URINE, RANDOM  Final   Special Requests   Final    Added 0149 12/15/2020 Performed at [facility], [place] [address] [place], [place] [address]    Culture >=100,000 COLONIES/mL PROTEUS MIRABILIS (A)  Final   Report Status 12/17/2020 FINAL  Final   Organism ID, Bacteria PROTEUS MIRABILIS (A)  Final      Susceptibility   Proteus mirabilis - MIC*    AMPICILLIN 8 SENSITIVE Sensitive     CEFAZOLIN 16 SENSITIVE Sensitive     CEFEPIME <=0.12 SENSITIVE Sensitive     CEFTRIAXONE <=0.25 SENSITIVE Sensitive     CIPROFLOXACIN >=4 RESISTANT Resistant     GENTAMICIN >=16 RESISTANT Resistant     IMIPENEM 2 SENSITIVE Sensitive     NITROFURANTOIN 128 RESISTANT Resistant     TRIMETH/SULFA >=320 RESISTANT Resistant     AMPICILLIN/SULBACTAM 8 SENSITIVE Sensitive     PIP/TAZO <=4 SENSITIVE Sensitive     * >=100,000 COLONIES/mL PROTEUS MIRABILIS  Resp Panel by RT-PCR (Flu A&B, Covid) Nasopharyngeal Swab     Status: None   Collection Time: 12/15/20  1:10 AM   Specimen: Nasopharyngeal Swab; Nasopharyngeal(NP) swabs in vial transport medium  Result Value Ref Range Status   SARS Coronavirus 2 by RT PCR  NEGATIVE NEGATIVE Final    Comment: (NOTE) SARS-CoV-2 target nucleic acids are NOT DETECTED.  The SARS-CoV-2 RNA is generally detectable in upper respiratory specimens during the acute phase of infection. The lowest concentration of SARS-CoV-2 viral copies this assay can detect is 138 copies/mL. A negative result does not preclude SARS-Cov-2 infection and should not be used as the sole basis for treatment or other patient management decisions. A negative result may occur with  improper specimen collection/handling, submission of specimen other than nasopharyngeal swab, presence of viral mutation(s) within the areas targeted by this assay, and inadequate number of viral copies(<138 copies/mL). A negative result must be combined with clinical observations, patient history, and epidemiological information. The expected result is Negative.  Fact Sheet for Patients:  EntrepreneurPulse.com.au  Fact Sheet for Healthcare Providers:  IncredibleEmployment.be  This test is no t yet approved or cleared by the [place] FDA and  has been authorized for detection and/or diagnosis of SARS-CoV-2 by FDA under an Emergency Use Authorization (EUA). This EUA will remain  in effect (meaning this test can be used) for the duration of the COVID-19 declaration under Section 564(b)(1) of the Act, 21 U.S.C.section 360bbb-3(b)(1), unless the authorization is terminated  or revoked sooner.       Influenza A by PCR NEGATIVE NEGATIVE Final   Influenza B by PCR NEGATIVE NEGATIVE Final    Comment: (NOTE) The Xpert Xpress SARS-CoV-2/FLU/RSV plus assay is intended as an aid in the diagnosis of influenza from Nasopharyngeal swab specimens and should not be used as a sole basis for treatment. Nasal washings and aspirates are unacceptable for Xpert Xpress SARS-CoV-2/FLU/RSV testing.  Fact Sheet for Patients: EntrepreneurPulse.com.au  Fact Sheet for  Healthcare Providers: IncredibleEmployment.be  This test is not yet approved or cleared by the [place] FDA and has been authorized for detection and/or diagnosis of SARS-CoV-2 by FDA under an Emergency Use Authorization (EUA). This EUA will remain in effect (meaning this test can be used) for the duration of the COVID-19 declaration under Section 564(b)(1) of the Act, 21 U.S.C. section 360bbb-3(b)(1), unless the authorization is terminated or revoked.  Performed at [facility], [place] [address], [place], [place] [address]  Radiology Studies: No results found.      Scheduled Meds:  amLODipine  5 mg Oral Daily   Chlorhexidine Gluconate Cloth  6 each Topical Daily   citalopram  20 mg Oral Daily   clopidogrel  75 mg Oral Daily   enoxaparin (LOVENOX) injection  40 mg Subcutaneous Q24H   finasteride  5 mg Oral Daily   [START ON 12/19/2020] fosfomycin  3 g Oral Once   lisinopril  10 mg Oral Daily   Continuous Infusions:  meropenem (MERREM) IV 1 g (12/18/20 0502)     LOS: 3 days    Time spent: 90min  Domenic Polite, MD [facility]   12/18/2020, 2:05 PM

## 2020-12-19 MED ORDER — NITROFURANTOIN MACROCRYSTAL 50 MG PO CAPS: 50.0000 mg | ORAL_CAPSULE | Freq: Every day | ORAL | 0 refills | Status: DC

## 2020-12-19 NOTE — Progress Notes (Signed)
Patient left via wheelchair accompanied by NT.  Patient has all belongings and is in no sign of distress at time of departure from floor.

## 2020-12-19 NOTE — Progress Notes (Signed)
Pt refused all morning medications including fosfomycin. MD made aware.

## 2020-12-19 NOTE — Care Management Important Message (Signed)
Important Message  Patient Details  Name: Todd Dunn MRN: [id number] Date of Birth: 20-Feb-1941   Medicare Important Message Given:  Yes - Important Message mailed due to current National Emergency   Verbal consent obtained due to current National Emergency  Relationship to patient: Self Contact Name: Aedin Call Date: 12/19/20  Time: 1204 Phone: [phone] Outcome: No Answer/Busy Important Message mailed to: Patient address on file    Delorse Lek 12/19/2020, 12:04 PM

## 2020-12-19 NOTE — Discharge Summary (Signed)
Physician Discharge Summary  Todd Dunn HBZ:[id number] DOB: 01-15-1941 DOA: 12/14/2020  PCP: Pcp, No  Admit date: 12/14/2020 Discharge date: 12/19/2020  Admitted From: Home Disposition:  Home   Recommendations for Outpatient Follow-up:  Follow up with PCP in 1-2 weeks Please obtain BMP/CBC in one week Please follow up on the following pending results:  Home Health:YES  Discharge Condition:Stable CODE STATUS:FULL Diet recommendation: Dysphagia 3, with thin liquids, heart healthy.  Brief/Interim Summary:  Todd Dunn is an 80 year old male with history of CVA with residual left hemiplegia, dysphagia and dysarthria, BPH, neurogenic bladder with chronic Foley, SSS status post PPM, recurrent UTIs was brought to the ED with lower abdominal discomfort, change in color, cloudy urine and worsening mental status. -In the ED his urinalysis was abnormal, Turbid appearing with positive nitrite, leukoesterase, greater than 50 WBCs, on CT-Foley catheter is noted with the balloon in the prosthetic urethra, this was advanced -Started on meropenem for history of ESBL UTIs and admitted      Catheter associated UTI -Foley catheter was last changed on 7/4, balloon was noted to be in the prostatic urethra on imaging and this was advanced -History of recurrent resistant infections including ESBL on 7/4, but this admission cultures came back for non ESBL organism, patient was treated with total of 5 days of meropenem - it does appears appears he is  on Macrobid 50 mg daily chronically, apparently he ran out of antibiotics last week, so he he was provided refill on discharge.   Metabolic encephalopathy -Likely secondary to above, improving, CT head unremarkable, back at baseline   Neurogenic bladder, chronic Foley -Foley catheter advanced so balloon in the bladder now   History of CVA -With residual left hemiplegia, dysarthria and dysphagia -Continue Plavix -per family he's mostly wheel chair  bound   Hypertension -Resume home medications on discharge   Hyperglycemia - hba1c is 5.6, diet controlled, not on any medications.   Chronic anemia -stable, monitor Discharge Diagnoses:  Principal Problem:   Catheter-associated urinary tract infection ([place]) Active Problems:   Acute metabolic encephalopathy   Chronic indwelling Foley catheter   HTN (hypertension)   History of ESBL E. coli infection    Discharge Instructions  Discharge Instructions     Diet - low sodium heart healthy   Complete by: As directed    Discharge instructions   Complete by: As directed    Follow with Primary MD  in 7 days   Get CBC, CMP,  checked  by Primary MD next visit.     Disposition Home    Diet: Dysphagia 3 with thin liquid   On your next visit with your primary care physician please Get Medicines reviewed and adjusted.   Please request your Prim.MD to go over all Hospital Tests and Procedure/Radiological results at the follow up, please get all Hospital records sent to your Prim MD by signing hospital release before you go home.   If you experience worsening of your admission symptoms, develop shortness of breath, life threatening emergency, suicidal or homicidal thoughts you must seek medical attention immediately by calling 911 or calling your MD immediately  if symptoms less severe.  You Must read complete instructions/literature along with all the possible adverse reactions/side effects for all the Medicines you take and that have been prescribed to you. Take any new Medicines after you have completely understood and accpet all the possible adverse reactions/side effects.   Do not drive, operating heavy machinery, perform activities at heights, swimming or participation  in water activities or provide baby sitting services if your were admitted for syncope or siezures until you have seen by Primary MD or a Neurologist and advised to do so again.  Do not drive when taking Pain  medications.    Do not take more than prescribed Pain, Sleep and Anxiety Medications  Special Instructions: If you have smoked or chewed Tobacco  in the last 2 yrs please stop smoking, stop any regular Alcohol  and or any Recreational drug use.  Wear Seat belts while driving.   Please note  You were cared for by a hospitalist during your hospital stay. If you have any questions about your discharge medications or the care you received while you were in the hospital after you are discharged, you can call the unit and asked to speak with the hospitalist on call if the hospitalist that took care of you is not available. Once you are discharged, your primary care physician will handle any further medical issues. Please note that NO REFILLS for any discharge medications will be authorized once you are discharged, as it is imperative that you return to your primary care physician (or establish a relationship with a primary care physician if you do not have one) for your aftercare needs so that they can reassess your need for medications and monitor your lab values.   Increase activity slowly   Complete by: As directed       Allergies as of 12/19/2020       Reactions   Aspirin Itching   [employer] (m. Mercenaria) Skin Test Itching   Penicillins         Medication List     TAKE these medications    acetaminophen 325 MG tablet Commonly known as: TYLENOL Take 650 mg by mouth every 4 (four) hours as needed for mild pain.   amLODipine 10 MG tablet Commonly known as: NORVASC Take 10 mg by mouth daily.   citalopram 20 MG tablet Commonly known as: CELEXA Take 20 mg by mouth at bedtime.   clopidogrel 75 MG tablet Commonly known as: PLAVIX Take 75 mg by mouth at bedtime.   FeroSul 325 (65 FE) MG tablet Generic drug: ferrous sulfate Take 325 mg by mouth daily.   finasteride 5 MG tablet Commonly known as: PROSCAR Take 5 mg by mouth daily.   furosemide 20 MG  tablet Commonly known as: LASIX Take 20 mg by mouth daily.   hydrALAZINE 50 MG tablet Commonly known as: APRESOLINE Take 50 mg by mouth 3 (three) times daily.   isosorbide mononitrate 60 MG 24 hr tablet Commonly known as: IMDUR Take 60 mg by mouth daily.   lisinopril 40 MG tablet Commonly known as: ZESTRIL Take 40 mg by mouth daily.   nitrofurantoin 50 MG capsule Commonly known as: MACRODANTIN Take 1 capsule (50 mg total) by mouth at bedtime. What changed: Another medication with the same name was removed. Continue taking this medication, and follow the directions you see here.   nitroGLYCERIN 0.4 MG SL tablet Commonly known as: NITROSTAT Place 0.4 mg under the tongue every 5 (five) minutes as needed for chest pain.   rosuvastatin 10 MG tablet Commonly known as: CRESTOR Take 10 mg by mouth at bedtime.   tamsulosin 0.4 MG Caps capsule Commonly known as: FLOMAX Take 0.4 mg by mouth daily.   Vitamin D (Ergocalciferol) 1.25 MG (50000 UNIT) Caps capsule Commonly known as: DRISDOL Take 50,000 Units by mouth every Monday.  Follow-up [place], [facility] Follow up.   Contact information: [place] [phone]                Allergies  Allergen Reactions   Aspirin Itching   [place] [employer] (M. Mercenaria) Skin Test Itching   Penicillins     Consultations: None   Procedures/Studies: DG Thoracic Spine 2 View  Result Date: 12/08/2020 CLINICAL DATA:  Fall with middle spine pain EXAM: THORACIC SPINE 2 VIEWS COMPARISON:  Chest x-ray 09/15/2020 FINDINGS: Post sternotomy changes and cardiac pacing device. Vertebral body heights are maintained. Diffuse degenerative changes. IMPRESSION: Degenerative changes.  No acute osseous abnormality Electronically Signed   By: Donavan Foil M.D.   On: 12/08/2020 16:57   CT Head Wo Contrast  Result Date: 12/15/2020 CLINICAL DATA:  80 year old male  with delirium. EXAM: CT HEAD WITHOUT CONTRAST TECHNIQUE: Contiguous axial images were obtained from the base of the skull through the vertex without intravenous contrast. COMPARISON:  Head CT dated 12/08/2020. FINDINGS: Brain: Moderate age-related atrophy and chronic microvascular ischemic changes. There is no acute intracranial hemorrhage. No mass effect midline shift. No extra-axial fluid collection. Vascular: No hyperdense vessel or unexpected calcification. Skull: Normal. Negative for fracture or focal lesion. Sinuses/Orbits: No acute finding. Other: Small left supraorbital/forehead lipoma. IMPRESSION: 1. No acute intracranial pathology. 2. Moderate age-related atrophy and chronic microvascular ischemic changes. Electronically Signed   By: Anner Crete M.D.   On: 12/15/2020 02:41   CT Head Wo Contrast  Result Date: 12/08/2020 CLINICAL DATA:  Head and neck trauma secondary to a fall. EXAM: CT HEAD WITHOUT CONTRAST CT CERVICAL SPINE WITHOUT CONTRAST TECHNIQUE: Multidetector CT imaging of the head and cervical spine was performed following the standard protocol without intravenous contrast. Multiplanar CT image reconstructions of the cervical spine were also generated. COMPARISON:  09/15/2020 FINDINGS: CT HEAD FINDINGS Brain: There is no evidence for acute hemorrhage, hydrocephalus, mass lesion, or abnormal extra-axial fluid collection. No definite CT evidence for acute infarction. Diffuse loss of parenchymal volume is consistent with atrophy. Patchy low attenuation in the deep hemispheric and periventricular white matter is nonspecific, but likely reflects chronic microvascular ischemic demyelination. Vascular: No hyperdense vessel or unexpected calcification. Skull: No evidence for fracture. No worrisome lytic or sclerotic lesion. Sinuses/Orbits: The visualized paranasal sinuses and mastoid air cells are clear. Visualized portions of the globes and intraorbital fat are unremarkable. Other: Small scalp  lipoma noted left forehead. CT CERVICAL SPINE FINDINGS Alignment: Straightening of normal cervical lordosis. No subluxation. Skull base and vertebrae: No acute fracture. No primary bone lesion or focal pathologic process. Soft tissues and spinal canal: No prevertebral fluid or swelling. No visible canal hematoma. Disc levels: Diffuse loss of intervertebral disc height with endplate degeneration noted in the cervical spine, similar to prior. Upper chest: Centrilobular emphysema noted in lung apices. Other: None. IMPRESSION: 1. No acute intracranial abnormality. Atrophy with chronic small vessel white matter ischemic disease. 2. Degenerative changes in the cervical spine without fracture. 3. Loss of cervical lordosis. This can be related to patient positioning, muscle spasm or soft tissue injury. Electronically Signed   By: Misty Stanley M.D.   On: 12/08/2020 15:04   CT Cervical Spine Wo Contrast  Result Date: 12/08/2020 CLINICAL DATA:  Head and neck trauma secondary to a fall. EXAM: CT HEAD WITHOUT CONTRAST CT CERVICAL SPINE WITHOUT CONTRAST TECHNIQUE: Multidetector CT imaging of the head and cervical spine was performed following the  standard protocol without intravenous contrast. Multiplanar CT image reconstructions of the cervical spine were also generated. COMPARISON:  09/15/2020 FINDINGS: CT HEAD FINDINGS Brain: There is no evidence for acute hemorrhage, hydrocephalus, mass lesion, or abnormal extra-axial fluid collection. No definite CT evidence for acute infarction. Diffuse loss of parenchymal volume is consistent with atrophy. Patchy low attenuation in the deep hemispheric and periventricular white matter is nonspecific, but likely reflects chronic microvascular ischemic demyelination. Vascular: No hyperdense vessel or unexpected calcification. Skull: No evidence for fracture. No worrisome lytic or sclerotic lesion. Sinuses/Orbits: The visualized paranasal sinuses and mastoid air cells are clear.  Visualized portions of the globes and intraorbital fat are unremarkable. Other: Small scalp lipoma noted left forehead. CT CERVICAL SPINE FINDINGS Alignment: Straightening of normal cervical lordosis. No subluxation. Skull base and vertebrae: No acute fracture. No primary bone lesion or focal pathologic process. Soft tissues and spinal canal: No prevertebral fluid or swelling. No visible canal hematoma. Disc levels: Diffuse loss of intervertebral disc height with endplate degeneration noted in the cervical spine, similar to prior. Upper chest: Centrilobular emphysema noted in lung apices. Other: None. IMPRESSION: 1. No acute intracranial abnormality. Atrophy with chronic small vessel white matter ischemic disease. 2. Degenerative changes in the cervical spine without fracture. 3. Loss of cervical lordosis. This can be related to patient positioning, muscle spasm or soft tissue injury. Electronically Signed   By: Misty Stanley M.D.   On: 12/08/2020 15:04   DG Chest Portable 1 View  Result Date: 12/15/2020 CLINICAL DATA:  Altered mental status EXAM: PORTABLE CHEST 1 VIEW COMPARISON:  11/11/2020 FINDINGS: Cardiac shadow is mildly enlarged but stable. Defibrillator is again seen and stable. Postsurgical changes are noted. Lungs are clear bilaterally. No focal infiltrate or effusion is noted. IMPRESSION: No acute abnormality noted. Electronically Signed   By: Inez Catalina M.D.   On: 12/15/2020 01:24   CT Renal Stone Study  Result Date: 12/15/2020 CLINICAL DATA:  Flank pain and history of diarrhea EXAM: CT ABDOMEN AND PELVIS WITHOUT CONTRAST TECHNIQUE: Multidetector CT imaging of the abdomen and pelvis was performed following the standard protocol without IV contrast. COMPARISON:  11/11/2020 FINDINGS: Lower chest: No acute abnormality. Hepatobiliary: No focal liver abnormality is seen. No gallstones, gallbladder wall thickening, or biliary dilatation. Pancreas: Unremarkable. No pancreatic ductal dilatation or  surrounding inflammatory changes. Spleen: Normal in size without focal abnormality. Adrenals/Urinary Tract: Right adrenal gland is within normal limits. Somewhat rounded appearance to the left adrenal gland is noted but stable. Nonobstructing right renal stone is noted in the upper pole measuring 3 mm. Vascular calcifications are seen. Bladder is well distended. Foley catheter is noted although is not in adequate position. The Foley balloon catheter is now noted within the prosthetic urethra. This should be deflated and advanced deeper into the bladder. Some calcification is noted along the posterior wall of the urinary bladder. This may represent a small bladder calculus but was not present on the prior exam. Could also represent recently passed calculi. Stomach/Bowel: Colon is well visualized and within normal limits. No inflammatory changes are seen. Stomach and small bowel are unremarkable. The appendix is not well visualized although no inflammatory changes to suggest appendicitis are seen. Vascular/Lymphatic: Aortic atherosclerosis. No enlarged abdominal or pelvic lymph nodes. Reproductive: Prostate is within normal limits although the Foley catheter is misplaced with the balloon in the prosthetic urethra. Other: No abdominal wall hernia or abnormality. No abdominopelvic ascites. Musculoskeletal: No acute or significant osseous findings. IMPRESSION: Foley catheter is noted with  the balloon in the prosthetic urethra. This should be deflated and readvanced into the bladder. No findings to suggest colovesical fistula. Dependent calcification within the bladder which may represent an early bladder stone or possibly recently passed calculus. Prominent left adrenal gland stable in appearance from the prior exam. Nonobstructing right renal stone also stable from the prior study. Electronically Signed   By: Inez Catalina M.D.   On: 12/15/2020 02:45      Subjective: No significant events overnight as discussed with  staff, patient did refuse his medications this morning,  Discharge Exam: Vitals:   12/19/20 0339 12/19/20 0723  BP: 102/65 125/67  Pulse: 65 63  Resp: 18 14  Temp: 98.1 F (36.7 C) 97.6 F (36.4 C)  SpO2: 96% 100%   Vitals:   12/18/20 2001 12/18/20 2358 12/19/20 0339 12/19/20 0723  BP: 114/60 122/71 102/65 125/67  Pulse: 65 63 65 63  Resp: 19 19 18 14   Temp: 97.8 F (36.6 C) 98 F (36.7 C) 98.1 F (36.7 C) 97.6 F (36.4 C)  TempSrc: Oral Oral Oral Oral  SpO2: 98% 100% 96% 100%  Weight:      Height:         Awake Alert, frail, deconditioned, impaired judgment and insight, with left-sided hemiplegia Symmetrical Chest wall movement, Good air movement bilaterally, CTAB RRR,No Gallops,Rubs or new Murmurs, No Parasternal Heave +ve B.Sounds, Abd Soft, No tenderness, No rebound - guarding or rigidity. No Cyanosis, Clubbing or edema, No new Rash or bruise      The results of significant diagnostics from this hospitalization (including imaging, microbiology, ancillary and laboratory) are listed below for reference.     Microbiology: Recent Results (from the past 240 hour(s))  Urine culture     Status: Abnormal   Collection Time: 12/14/20  1:48 AM   Specimen: Urine, Random  Result Value Ref Range Status   Specimen Description URINE, RANDOM  Final   Special Requests   Final    Added 0149 12/15/2020 Performed at [facility], [place] [address], [place], [place] [address]    Culture >=100,000 COLONIES/mL PROTEUS MIRABILIS (A)  Final   Report Status 12/17/2020 FINAL  Final   Organism ID, Bacteria PROTEUS MIRABILIS (A)  Final      Susceptibility   Proteus mirabilis - MIC*    AMPICILLIN 8 SENSITIVE Sensitive     CEFAZOLIN 16 SENSITIVE Sensitive     CEFEPIME <=0.12 SENSITIVE Sensitive     CEFTRIAXONE <=0.25 SENSITIVE Sensitive     CIPROFLOXACIN >=4 RESISTANT Resistant     GENTAMICIN >=16 RESISTANT Resistant     IMIPENEM 2 SENSITIVE Sensitive     NITROFURANTOIN 128  RESISTANT Resistant     TRIMETH/SULFA >=320 RESISTANT Resistant     AMPICILLIN/SULBACTAM 8 SENSITIVE Sensitive     PIP/TAZO <=4 SENSITIVE Sensitive     * >=100,000 COLONIES/mL PROTEUS MIRABILIS  Resp Panel by RT-PCR (Flu A&B, Covid) Nasopharyngeal Swab     Status: None   Collection Time: 12/15/20  1:10 AM   Specimen: Nasopharyngeal Swab; Nasopharyngeal(NP) swabs in vial transport medium  Result Value Ref Range Status   SARS Coronavirus 2 by RT PCR NEGATIVE NEGATIVE Final    Comment: (NOTE) SARS-CoV-2 target nucleic acids are NOT DETECTED.  The SARS-CoV-2 RNA is generally detectable in upper respiratory specimens during the acute phase of infection. The lowest concentration of SARS-CoV-2 viral copies this assay can detect is 138 copies/mL. A negative result does not preclude SARS-Cov-2 infection and should not  be used as the sole basis for treatment or other patient management decisions. A negative result may occur with  improper specimen collection/handling, submission of specimen other than nasopharyngeal swab, presence of viral mutation(s) within the areas targeted by this assay, and inadequate number of viral copies(<138 copies/mL). A negative result must be combined with clinical observations, patient history, and epidemiological information. The expected result is Negative.  Fact Sheet for Patients:  EntrepreneurPulse.com.au  Fact Sheet for Healthcare Providers:  IncredibleEmployment.be  This test is no t yet approved or cleared by the [place] FDA and  has been authorized for detection and/or diagnosis of SARS-CoV-2 by FDA under an Emergency Use Authorization (EUA). This EUA will remain  in effect (meaning this test can be used) for the duration of the COVID-19 declaration under Section 564(b)(1) of the Act, 21 U.S.C.section 360bbb-3(b)(1), unless the authorization is terminated  or revoked sooner.       Influenza A by PCR  NEGATIVE NEGATIVE Final   Influenza B by PCR NEGATIVE NEGATIVE Final    Comment: (NOTE) The Xpert Xpress SARS-CoV-2/FLU/RSV plus assay is intended as an aid in the diagnosis of influenza from Nasopharyngeal swab specimens and should not be used as a sole basis for treatment. Nasal washings and aspirates are unacceptable for Xpert Xpress SARS-CoV-2/FLU/RSV testing.  Fact Sheet for Patients: EntrepreneurPulse.com.au  Fact Sheet for Healthcare Providers: IncredibleEmployment.be  This test is not yet approved or cleared by the [place] FDA and has been authorized for detection and/or diagnosis of SARS-CoV-2 by FDA under an Emergency Use Authorization (EUA). This EUA will remain in effect (meaning this test can be used) for the duration of the COVID-19 declaration under Section 564(b)(1) of the Act, 21 U.S.C. section 360bbb-3(b)(1), unless the authorization is terminated or revoked.  Performed at [facility], [place] [address] [place], [place] [address]      Labs: BNP (last 3 results) No results for input(s): BNP in the last 8760 hours. Basic Metabolic Panel: Recent Labs  Lab 12/14/20 2245 12/15/20 0446 12/16/20 0225  NA 142 138 139  K 3.9 3.4* 4.0  CL 107 106 107  CO2 26 26 29   GLUCOSE 144* 128* 114*  BUN 23 20 14   CREATININE 1.02 0.82 0.64  CALCIUM 10.3 9.4 9.5   Liver Function Tests: Recent Labs  Lab 12/15/20 0446  AST 13*  ALT 10  ALKPHOS 111  BILITOT 0.6  PROT 6.3*  ALBUMIN 2.9*   No results for input(s): LIPASE, AMYLASE in the last 168 hours. No results for input(s): AMMONIA in the last 168 hours. CBC: Recent Labs  Lab 12/14/20 2245 12/15/20 0446 12/16/20 0225  WBC 7.6 5.7 4.2  NEUTROABS 5.7  --   --   HGB 9.8* 9.1* 9.6*  HCT 33.1* 30.3* 31.5*  MCV 83.6 83.0 81.6  PLT 238 179 181   Cardiac Enzymes: No results for input(s): CKTOTAL, CKMB, CKMBINDEX, TROPONINI in the last 168 hours. BNP: Invalid  input(s): POCBNP CBG: No results for input(s): GLUCAP in the last 168 hours. D-Dimer No results for input(s): DDIMER in the last 72 hours. Hgb A1c No results for input(s): HGBA1C in the last 72 hours. Lipid Profile No results for input(s): CHOL, HDL, LDLCALC, TRIG, CHOLHDL, LDLDIRECT in the last 72 hours. Thyroid function studies No results for input(s): TSH, T4TOTAL, T3FREE, THYROIDAB in the last 72 hours.  Invalid input(s): FREET3 Anemia work up No results for input(s): VITAMINB12, FOLATE, FERRITIN, TIBC, IRON, RETICCTPCT in the last 72 hours.  Urinalysis    Component Value Date/Time   COLORURINE YELLOW 12/15/2020 0117   APPEARANCEUR TURBID (A) 12/15/2020 0117   LABSPEC 1.014 12/15/2020 0117   PHURINE 8.0 12/15/2020 0117   GLUCOSEU NEGATIVE 12/15/2020 0117   HGBUR MODERATE (A) 12/15/2020 0117   BILIRUBINUR NEGATIVE 12/15/2020 0117   KETONESUR NEGATIVE 12/15/2020 0117   PROTEINUR 100 (A) 12/15/2020 0117   NITRITE POSITIVE (A) 12/15/2020 0117   LEUKOCYTESUR LARGE (A) 12/15/2020 0117   Sepsis Labs Invalid input(s): PROCALCITONIN,  WBC,  LACTICIDVEN Microbiology Recent Results (from the past 240 hour(s))  Urine culture     Status: Abnormal   Collection Time: 12/14/20  1:48 AM   Specimen: Urine, Random  Result Value Ref Range Status   Specimen Description URINE, RANDOM  Final   Special Requests   Final    Added 0149 12/15/2020 Performed at [facility], [place] [address] [place], [place] [address]    Culture >=100,000 COLONIES/mL PROTEUS MIRABILIS (A)  Final   Report Status 12/17/2020 FINAL  Final   Organism ID, Bacteria PROTEUS MIRABILIS (A)  Final      Susceptibility   Proteus mirabilis - MIC*    AMPICILLIN 8 SENSITIVE Sensitive     CEFAZOLIN 16 SENSITIVE Sensitive     CEFEPIME <=0.12 SENSITIVE Sensitive     CEFTRIAXONE <=0.25 SENSITIVE Sensitive     CIPROFLOXACIN >=4 RESISTANT Resistant     GENTAMICIN >=16 RESISTANT Resistant     IMIPENEM 2 SENSITIVE Sensitive      NITROFURANTOIN 128 RESISTANT Resistant     TRIMETH/SULFA >=320 RESISTANT Resistant     AMPICILLIN/SULBACTAM 8 SENSITIVE Sensitive     PIP/TAZO <=4 SENSITIVE Sensitive     * >=100,000 COLONIES/mL PROTEUS MIRABILIS  Resp Panel by RT-PCR (Flu A&B, Covid) Nasopharyngeal Swab     Status: None   Collection Time: 12/15/20  1:10 AM   Specimen: Nasopharyngeal Swab; Nasopharyngeal(NP) swabs in vial transport medium  Result Value Ref Range Status   SARS Coronavirus 2 by RT PCR NEGATIVE NEGATIVE Final    Comment: (NOTE) SARS-CoV-2 target nucleic acids are NOT DETECTED.  The SARS-CoV-2 RNA is generally detectable in upper respiratory specimens during the acute phase of infection. The lowest concentration of SARS-CoV-2 viral copies this assay can detect is 138 copies/mL. A negative result does not preclude SARS-Cov-2 infection and should not be used as the sole basis for treatment or other patient management decisions. A negative result may occur with  improper specimen collection/handling, submission of specimen other than nasopharyngeal swab, presence of viral mutation(s) within the areas targeted by this assay, and inadequate number of viral copies(<138 copies/mL). A negative result must be combined with clinical observations, patient history, and epidemiological information. The expected result is Negative.  Fact Sheet for Patients:  EntrepreneurPulse.com.au  Fact Sheet for Healthcare Providers:  IncredibleEmployment.be  This test is no t yet approved or cleared by the [place] FDA and  has been authorized for detection and/or diagnosis of SARS-CoV-2 by FDA under an Emergency Use Authorization (EUA). This EUA will remain  in effect (meaning this test can be used) for the duration of the COVID-19 declaration under Section 564(b)(1) of the Act, 21 U.S.C.section 360bbb-3(b)(1), unless the authorization is terminated  or revoked sooner.        Influenza A by PCR NEGATIVE NEGATIVE Final   Influenza B by PCR NEGATIVE NEGATIVE Final    Comment: (NOTE) The Xpert Xpress SARS-CoV-2/FLU/RSV plus assay is intended as an aid in the diagnosis of  influenza from Nasopharyngeal swab specimens and should not be used as a sole basis for treatment. Nasal washings and aspirates are unacceptable for Xpert Xpress SARS-CoV-2/FLU/RSV testing.  Fact Sheet for Patients: EntrepreneurPulse.com.au  Fact Sheet for Healthcare Providers: IncredibleEmployment.be  This test is not yet approved or cleared by the [place] FDA and has been authorized for detection and/or diagnosis of SARS-CoV-2 by FDA under an Emergency Use Authorization (EUA). This EUA will remain in effect (meaning this test can be used) for the duration of the COVID-19 declaration under Section 564(b)(1) of the Act, 21 U.S.C. section 360bbb-3(b)(1), unless the authorization is terminated or revoked.  Performed at [facility], [place] [address] [place], [place] [address]      Time coordinating discharge: Over 30 minutes  SIGNED:   Phillips Climes, MD  [facility] 12/19/2020, 11:44 AM Pager   If 7PM-7AM, please contact night-coverage [email or web address] Password [email or web address]

## 2020-12-19 NOTE — Progress Notes (Signed)
Spoke with pt's daughter Todd Dunn has called back. [name] stated she will pick up pt to transport home at [place]. Discharge paperwork reviewed with [name]. [name] verbalized understanding.

## 2020-12-19 NOTE — Progress Notes (Signed)
Called daughter back to remind her to pick up pt's prescription from [facility].

## 2020-12-19 NOTE — Progress Notes (Signed)
Occupational Therapy Treatment Patient Details Name: JDEN WANT MRN: [id number] DOB: 1940-11-19 Today's Date: 12/19/2020    History of present illness 80 year old male brought to the ED on 12/14/20 with lower abdominal discomfort, change in color, cloudy urine and worsening mental status.  Pt dx with catheter associated UTI (neurogentic bladder, chronic foley), metabolic encephalopathy. History of CVA with residual left hemiplegia, dysphagia and dysarthria, BPH, neurogenic bladder with chronic Foley, SSS status post PPM, recurrent UTIs was   OT comments  Pt progressing toward established goals. Pt currently requires supervision for bed mobility.  Setup assistance for supervision with grooming and applying lotion to BLE while sitting EOB. Pt will continue to benefit from skilled OT services to maximize safety and independence with ADL/IADL and functional mobility. Will continue to follow acutely and progress as tolerated.      Follow Up Recommendations  Home health OT;Supervision/Assistance - 24 hour    Equipment Recommendations  Other (comment) (family needs anti tippers for wc)    Recommendations for Other Services PT consult    Precautions / Restrictions Precautions Precautions: Fall Precaution Comments: indwelling foley Restrictions Weight Bearing Restrictions: No       Mobility Bed Mobility Overal bed mobility: Needs Assistance Bed Mobility: Supine to Sit;Sit to Supine     Supine to sit: Supervision Sit to supine: Supervision   General bed mobility comments: supervision for safety due to impulsivity    Transfers                      Balance Overall balance assessment: Needs assistance Sitting-balance support: Feet supported;No upper extremity supported Sitting balance-Leahy Scale: Fair Sitting balance - Comments: tolerated sitting eob ~96min for ADL                                   ADL either performed or assessed with clinical  judgement   ADL Overall ADL's : Needs assistance/impaired Eating/Feeding: Set up;Supervision/ safety;Sitting   Grooming: Oral care;Set up;Supervision/safety;Wash/dry face;Wash/dry hands       Lower Body Bathing: Min guard;Sitting/lateral leans Lower Body Bathing Details (indicate cue type and reason): pt applied lotion to BLE while sitting EOB             Toileting- Clothing Manipulation and Hygiene: Total assistance Toileting - Clothing Manipulation Details (indicate cue type and reason): foley     Functional mobility during ADLs: Minimal assistance;Cueing for safety General ADL Comments: Most likely close to baseline prior to admission     Vision       Perception     Praxis      Cognition Arousal/Alertness: Awake/alert Behavior During Therapy: Impulsive Overall Cognitive Status: Impaired/Different from baseline Area of Impairment: Orientation;Attention;Memory;Awareness;Safety/judgement;Problem solving                 Orientation Level: Disoriented to Current Attention Level: Sustained Memory: Decreased short-term memory   Safety/Judgement: Decreased awareness of safety;Decreased awareness of deficits Awareness: Intellectual Problem Solving: Slow processing General Comments: Unsure of baseline given h/o CVA.  Very impulsive, decreased awareness of his deficits. difficult to fully assess due to expressive limitations.        Exercises     Shoulder Instructions       General Comments vss on RA    Pertinent Vitals/ Pain       Pain Assessment: No/denies pain  Home Living  Prior Functioning/Environment              Frequency  Min 2X/week        Progress Toward Goals  OT Goals(current goals can now be found in the care plan section)  Progress towards OT goals: Progressing toward goals  Acute Rehab OT Goals Patient Stated Goal: to go home OT Goal Formulation: With  patient/family Time For Goal Achievement: 12/31/20 Potential to Achieve Goals: Good ADL Goals Pt Will Transfer to Toilet: with min guard assist;bedside commode;squat pivot transfer Pt Will Perform Toileting - Clothing Manipulation and hygiene: with min assist;sitting/lateral leans Additional ADL Goal #1: Pt will verbalize 2 strategies to reduce risk of falls with min vc  Plan Discharge plan remains appropriate    Co-evaluation                 AM-PAC OT "6 Clicks" Daily Activity     Outcome Measure   Help from another person eating meals?: A Little Help from another person taking care of personal grooming?: A Little Help from another person toileting, which includes using toliet, bedpan, or urinal?: Total Help from another person bathing (including washing, rinsing, drying)?: A Lot Help from another person to put on and taking off regular upper body clothing?: A Lot Help from another person to put on and taking off regular lower body clothing?: A Lot 6 Click Score: 13    End of Session    OT Visit Diagnosis: Unsteadiness on feet (R26.81);Other abnormalities of gait and mobility (R26.89);Muscle weakness (generalized) (M62.81);Other symptoms and signs involving the nervous system (R29.898);Other symptoms and signs involving cognitive function   Activity Tolerance Patient tolerated treatment well   Patient Left with call bell/phone within reach;in bed;with bed alarm set   Nurse Communication Mobility status        Time: 8588-5027 OT Time Calculation (min): 23 min  Charges: OT General Charges $OT Visit: 1 Visit OT Treatments $Self Care/Home Management : 23-37 mins  Helene Kelp OTR/L Acute Rehabilitation Services Office: [place] 12/19/2020, 12:05 PM

## 2020-12-19 NOTE — Discharge Instructions (Signed)
Follow with Primary MD  in 7 days   Get CBC, CMP,  checked  by Primary MD next visit.     Disposition Home    Diet: Dysphagia 3 with thin liquid   On your next visit with your primary care physician please Get Medicines reviewed and adjusted.   Please request your Prim.MD to go over all Hospital Tests and Procedure/Radiological results at the follow up, please get all Hospital records sent to your Prim MD by signing hospital release before you go home.   If you experience worsening of your admission symptoms, develop shortness of breath, life threatening emergency, suicidal or homicidal thoughts you must seek medical attention immediately by calling 911 or calling your MD immediately  if symptoms less severe.  You Must read complete instructions/literature along with all the possible adverse reactions/side effects for all the Medicines you take and that have been prescribed to you. Take any new Medicines after you have completely understood and accpet all the possible adverse reactions/side effects.   Do not drive, operating heavy machinery, perform activities at heights, swimming or participation in water activities or provide baby sitting services if your were admitted for syncope or siezures until you have seen by Primary MD or a Neurologist and advised to do so again.  Do not drive when taking Pain medications.    Do not take more than prescribed Pain, Sleep and Anxiety Medications  Special Instructions: If you have smoked or chewed Tobacco  in the last 2 yrs please stop smoking, stop any regular Alcohol  and or any Recreational drug use.  Wear Seat belts while driving.   Please note  You were cared for by a hospitalist during your hospital stay. If you have any questions about your discharge medications or the care you received while you were in the hospital after you are discharged, you can call the unit and asked to speak with the hospitalist on call if the hospitalist that  took care of you is not available. Once you are discharged, your primary care physician will handle any further medical issues. Please note that NO REFILLS for any discharge medications will be authorized once you are discharged, as it is imperative that you return to your primary care physician (or establish a relationship with a primary care physician if you do not have one) for your aftercare needs so that they can reassess your need for medications and monitor your lab values.

## 2020-12-23 ENCOUNTER — Encounter (HOSPITAL_COMMUNITY): Payer: Self-pay | Admitting: Emergency Medicine

## 2020-12-23 ENCOUNTER — Other Ambulatory Visit: Payer: Self-pay

## 2020-12-23 ENCOUNTER — Emergency Department (HOSPITAL_COMMUNITY)
Admission: EM | Admit: 2020-12-23 | Discharge: 2020-12-24 | Disposition: A | Discharge status: Home / Self Care | Payer: Medicare Other | Attending: Emergency Medicine | Admitting: Emergency Medicine

## 2020-12-23 DIAGNOSIS — R1084 Generalized abdominal pain: Secondary | ICD-10-CM | POA: Diagnosis not present

## 2020-12-23 DIAGNOSIS — F0390 Unspecified dementia without behavioral disturbance: Secondary | ICD-10-CM | POA: Diagnosis not present

## 2020-12-23 DIAGNOSIS — Z87891 Personal history of nicotine dependence: Secondary | ICD-10-CM | POA: Diagnosis not present

## 2020-12-23 DIAGNOSIS — R8290 Unspecified abnormal findings in urine: Secondary | ICD-10-CM | POA: Insufficient documentation

## 2020-12-23 DIAGNOSIS — I2510 Atherosclerotic heart disease of native coronary artery without angina pectoris: Secondary | ICD-10-CM | POA: Diagnosis not present

## 2020-12-23 DIAGNOSIS — Z950 Presence of cardiac pacemaker: Secondary | ICD-10-CM | POA: Diagnosis not present

## 2020-12-23 DIAGNOSIS — I10 Essential (primary) hypertension: Secondary | ICD-10-CM | POA: Insufficient documentation

## 2020-12-23 DIAGNOSIS — I119 Hypertensive heart disease without heart failure: Secondary | ICD-10-CM | POA: Diagnosis not present

## 2020-12-23 LAB — CBC WITH DIFFERENTIAL/PLATELET
Abs Immature Granulocytes: 0.01 10*3/uL (ref 0.00–0.07)
Basophils Absolute: 0 10*3/uL (ref 0.0–0.1)
Basophils Relative: 1 %
Eosinophils Absolute: 0 10*3/uL (ref 0.0–0.5)
Eosinophils Relative: 1 %
HCT: 31.4 % — ABNORMAL LOW (ref 39.0–52.0)
Hemoglobin: 9.2 g/dL — ABNORMAL LOW (ref 13.0–17.0)
Immature Granulocytes: 0 %
Lymphocytes Relative: 41 %
Lymphs Abs: 1.7 10*3/uL (ref 0.7–4.0)
MCH: 24.5 pg — ABNORMAL LOW (ref 26.0–34.0)
MCHC: 29.3 g/dL — ABNORMAL LOW (ref 30.0–36.0)
MCV: 83.5 fL (ref 80.0–100.0)
Monocytes Absolute: 0.5 10*3/uL (ref 0.1–1.0)
Monocytes Relative: 12 %
Neutro Abs: 1.9 10*3/uL (ref 1.7–7.7)
Neutrophils Relative %: 45 %
Platelets: 245 10*3/uL (ref 150–400)
RBC: 3.76 MIL/uL — ABNORMAL LOW (ref 4.22–5.81)
RDW: 17.1 % — ABNORMAL HIGH (ref 11.5–15.5)
WBC: 4.3 10*3/uL (ref 4.0–10.5)
nRBC: 0 % (ref 0.0–0.2)

## 2020-12-23 NOTE — ED Provider Notes (Signed)
MSE was initiated and I personally evaluated the patient and placed orders (if any) at  10:34 PM on December 23, 2020.  Patient brought to eD by family for concern for UTI. Released from facility 5 days ago, family went camping over the weekend. He has not been caring for the catheter. She feels urine in the bag looks cloudy. He complains of pain. She is also concerned it was not placed properly. No fever.   Patient with history of CVA, residual dysarthria.  Overall well appearing, in NAD. Awake and alert. Abdomen nontender.    The patient appears stable so that the remainder of the MSE may be completed by another provider.   Charlann Lange, PA-C 12/23/20 2236    Deno Etienne, DO 12/23/20 2306

## 2020-12-23 NOTE — ED Triage Notes (Addendum)
Pt brought in by granddaughter from home for possible UTI. Pt was released from rehab on Friday w/ foley catheter, granddaughter thinks catheter may be infected or malpositions. Reports dark colored urine on Sunday, states pt has been acting like he is in pain. Hx stroke w/ L side weakness

## 2020-12-24 ENCOUNTER — Emergency Department (HOSPITAL_COMMUNITY)
Admission: EM | Admit: 2020-12-24 | Discharge: 2020-12-25 | Disposition: A | Discharge status: Home / Self Care | Payer: Medicare Other | Source: Home / Self Care | Attending: Emergency Medicine | Admitting: Emergency Medicine

## 2020-12-24 ENCOUNTER — Other Ambulatory Visit: Payer: Self-pay

## 2020-12-24 DIAGNOSIS — Z87891 Personal history of nicotine dependence: Secondary | ICD-10-CM | POA: Insufficient documentation

## 2020-12-24 DIAGNOSIS — I119 Hypertensive heart disease without heart failure: Secondary | ICD-10-CM | POA: Insufficient documentation

## 2020-12-24 DIAGNOSIS — I2510 Atherosclerotic heart disease of native coronary artery without angina pectoris: Secondary | ICD-10-CM | POA: Insufficient documentation

## 2020-12-24 DIAGNOSIS — R1084 Generalized abdominal pain: Secondary | ICD-10-CM | POA: Insufficient documentation

## 2020-12-24 DIAGNOSIS — F0390 Unspecified dementia without behavioral disturbance: Secondary | ICD-10-CM | POA: Insufficient documentation

## 2020-12-24 DIAGNOSIS — Z79899 Other long term (current) drug therapy: Secondary | ICD-10-CM | POA: Insufficient documentation

## 2020-12-24 LAB — CBC WITH DIFFERENTIAL/PLATELET
Abs Immature Granulocytes: 0.01 10*3/uL (ref 0.00–0.07)
Basophils Absolute: 0 10*3/uL (ref 0.0–0.1)
Basophils Relative: 1 %
Eosinophils Absolute: 0 10*3/uL (ref 0.0–0.5)
Eosinophils Relative: 1 %
HCT: 31.6 % — ABNORMAL LOW (ref 39.0–52.0)
Hemoglobin: 9.5 g/dL — ABNORMAL LOW (ref 13.0–17.0)
Immature Granulocytes: 0 %
Lymphocytes Relative: 47 %
Lymphs Abs: 1.5 10*3/uL (ref 0.7–4.0)
MCH: 25.1 pg — ABNORMAL LOW (ref 26.0–34.0)
MCHC: 30.1 g/dL (ref 30.0–36.0)
MCV: 83.6 fL (ref 80.0–100.0)
Monocytes Absolute: 0.3 10*3/uL (ref 0.1–1.0)
Monocytes Relative: 10 %
Neutro Abs: 1.3 10*3/uL — ABNORMAL LOW (ref 1.7–7.7)
Neutrophils Relative %: 41 %
Platelets: 217 10*3/uL (ref 150–400)
RBC: 3.78 MIL/uL — ABNORMAL LOW (ref 4.22–5.81)
RDW: 17 % — ABNORMAL HIGH (ref 11.5–15.5)
WBC: 3.3 10*3/uL — ABNORMAL LOW (ref 4.0–10.5)
nRBC: 0 % (ref 0.0–0.2)

## 2020-12-24 LAB — COMPREHENSIVE METABOLIC PANEL
ALT: 9 U/L (ref 0–44)
AST: 11 U/L — ABNORMAL LOW (ref 15–41)
Albumin: 3.1 g/dL — ABNORMAL LOW (ref 3.5–5.0)
Alkaline Phosphatase: 128 U/L — ABNORMAL HIGH (ref 38–126)
Anion gap: 7 (ref 5–15)
BUN: 16 mg/dL (ref 8–23)
CO2: 28 mmol/L (ref 22–32)
Calcium: 9.8 mg/dL (ref 8.9–10.3)
Chloride: 103 mmol/L (ref 98–111)
Creatinine, Ser: 0.87 mg/dL (ref 0.61–1.24)
GFR, Estimated: >60 mL/min (ref >60)
Glucose, Bld: 107 mg/dL — ABNORMAL HIGH (ref 70–99)
Potassium: 3.9 mmol/L (ref 3.5–5.1)
Sodium: 138 mmol/L (ref 135–145)
Total Bilirubin: 0.5 mg/dL (ref 0.3–1.2)
Total Protein: 6.5 g/dL (ref 6.5–8.1)

## 2020-12-24 LAB — URINALYSIS, ROUTINE W REFLEX MICROSCOPIC
Bilirubin Urine: NEGATIVE
Glucose, UA: 100 mg/dL — AB
Ketones, ur: NEGATIVE mg/dL
Nitrite: NEGATIVE
Protein, ur: 100 mg/dL — AB
Specific Gravity, Urine: 1.025 (ref 1.005–1.030)
pH: 6.5 (ref 5.0–8.0)

## 2020-12-24 LAB — URINALYSIS, MICROSCOPIC (REFLEX): RBC / HPF: >50 RBC/hpf (ref 0–5)

## 2020-12-24 NOTE — ED Notes (Signed)
Urinary leg bag ordered. Dr. Sedonia Small aware

## 2020-12-24 NOTE — ED Provider Notes (Addendum)
Emergency Medicine Provider Triage Evaluation Note  Todd Dunn , a 80 y.o. male  was evaluated in triage.  Pt complains of pelvic pain.  Level 5 caveat applies due to dementia.  Daughter states he has a urine bag that is permanent.  He was admitted to the hospital due to hematuria recently, that is since resolved.  He was seen in the ED yesterday and had work-up with normal labs.  He was discharged home.  Daughter is concerned about continued pelvic pain.   Review of Systems  Positive: Pelvic pain Negative: Fevers, chills  Physical Exam  There were no vitals taken for this visit. Gen:   Awake, no distress   Resp:  Normal effort  MSK:   Moves extremities without difficulty  Other:    Medical Decision Making  Medically screening exam initiated at 11:29 PM.  Appropriate orders placed.  Asim L Schimming was informed that the remainder of the evaluation will be completed by another provider, this initial triage assessment does not replace that evaluation, and the importance of remaining in the ED until their evaluation is complete.     Sherrill Raring, PA-C 12/24/20 2330    Sherrill Raring, PA-C 12/24/20 2257    Lacretia Leigh, MD 12/31/20 1257

## 2020-12-24 NOTE — ED Triage Notes (Signed)
Pt reports pain in pelvic region, has foley catheter.  [facility] and [facility], pt at home tonight with new onset pain in pelvic area.

## 2020-12-24 NOTE — ED Provider Notes (Signed)
[facility] Emergency Department Provider Note MRN:  [id number]  Arrival date & time: 12/24/20     Chief Complaint   Urinary Tract Infection   History of Present Illness   Todd Dunn is a 80 y.o. year-old male with a history of stroke, ESBL UTI presenting to the ED with chief complaint of UTI.  Family concerned for UTI.  Urine has been more cloudy lately.  Recently discharged from the hospital.  Otherwise no new abnormal behaviors, normal mental status per family.  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  Level 5 caveat.  Review of Systems  Positive for cloudy urine.  Patient's Health History    Past Medical History:  Diagnosis Date   BPH (benign prostatic hyperplasia)    CAD (coronary artery disease)    Chronic indwelling Foley catheter    CVA (cerebral vascular accident) ([place])    HTN (hypertension)    Neurogenic bladder    SSS (sick sinus syndrome) ([place])     Past Surgical History:  Procedure Laterality Date   APPENDECTOMY     CARDIAC SURGERY     PACEMAKER PLACEMENT      Family History  Family history unknown: Yes    Social History   Socioeconomic History   Marital status: Widowed    Spouse name: Not on file   Number of children: Not on file   Years of education: Not on file   Highest education level: Not on file  Occupational History   Not on file  Tobacco Use   Smoking status: Former    Types: Cigarettes   Smokeless tobacco: Not on file  Substance and Sexual Activity   Alcohol use: Not Currently   Drug use: Never   Sexual activity: Not on file  Other Topics Concern   Not on file  Social History Narrative   Not on file   Social Determinants of Health   Financial Resource Strain: Not on file  Food Insecurity: Not on file  Transportation Needs: Not on file  Physical Activity:  Not on file  Stress: Not on file  Social Connections: Not on file  Intimate Partner Violence: Not on file     Physical Exam   Vitals:   12/23/20 2239 12/24/20 0336  BP: 102/80 (!) 143/57  Pulse: 73 66  Resp: 20 19  Temp: 98.2 F (36.8 C)   SpO2: 99% 97%    CONSTITUTIONAL: Well-appearing, NAD NEURO: Awake and alert, moves all extremities, prominent aphasia EYES:  eyes equal and reactive ENT/NECK:  no LAD, no JVD CARDIO: Regular rate, well-perfused, normal S1 and S2 PULM:  CTAB no wheezing or rhonchi GI/GU:  normal bowel sounds, non-distended, non-tender MSK/SPINE:  No gross deformities, no edema SKIN:  no rash, atraumatic PSYCH:  Appropriate speech and behavior  *Additional and/or pertinent findings included in MDM below  Diagnostic and Interventional Summary    EKG Interpretation  Date/Time:    Ventricular Rate:    PR Interval:    QRS Duration:   QT Interval:    QTC Calculation:   R Axis:     Text Interpretation:         Labs Reviewed  CBC WITH DIFFERENTIAL/PLATELET - Abnormal; Notable for the following components:      Result Value   RBC 3.76 (*)    Hemoglobin 9.2 (*)  HCT 31.4 (*)    MCH 24.5 (*)    MCHC 29.3 (*)    RDW 17.1 (*)    All other components within normal limits  COMPREHENSIVE METABOLIC PANEL - Abnormal; Notable for the following components:   Glucose, Bld 107 (*)    Albumin 3.1 (*)    AST 11 (*)    Alkaline Phosphatase 128 (*)    All other components within normal limits  URINALYSIS, ROUTINE W REFLEX MICROSCOPIC - Abnormal; Notable for the following components:   Color, Urine RED (*)    APPearance CLOUDY (*)    Glucose, UA 100 (*)    Hgb urine dipstick LARGE (*)    Protein, ur 100 (*)    Leukocytes,Ua MODERATE (*)    All other components within normal limits  URINALYSIS, MICROSCOPIC (REFLEX) - Abnormal; Notable for the following components:   Bacteria, UA RARE (*)    All other components within normal limits  URINE CULTURE     No orders to display    Medications - No data to display   Procedures  /  Critical Care Procedures  ED Course and Medical Decision Making  I have reviewed the triage vital signs, the nursing notes, and pertinent available records from the EMR.  Listed above are laboratory and imaging tests that I personally ordered, reviewed, and interpreted and then considered in my medical decision making (see below for details).  Cloudy urine, history of ESBL, recently admitted, if urinalysis looks infected may need to readmit.     Urinalysis has some leukoesterase but negative nitrates, minimal WBCs, overall improved from prior.  Patient has been sleeping peacefully with normal vital signs, no fever, has no leukocytosis, no indication for admission at this time, will send for culture.  Patient's granddaughter made aware of the culture and that they may be called with abnormal results.  Appropriate for discharge.  Barth Kirks. Sedonia Small, MD [place] [email or web address]  Final Clinical Impressions(s) / ED Diagnoses     ICD-10-CM   1. Cloudy urine  R82.90       ED Discharge Orders     None        Discharge Instructions Discussed with and Provided to Patient:     Discharge Instructions      You were evaluated in the Emergency Department and after careful evaluation, we did not find any emergent condition requiring admission or further testing in the hospital.  Your exam/testing today is overall reassuring.  We sent your urine sample for culture and will call with abnormal results.  Please return to the Emergency Department if you experience any worsening of your condition.   Thank you for allowing [place] to be a part of your care.        Maudie Flakes, MD 12/24/20 [phone]

## 2020-12-24 NOTE — ED Notes (Signed)
Foley drain bag changed to leg bag. Patient was assisted with getting dressed and was able to transfer to [facility] with standby/ 1 person assist. Patient was wheeled to ED exit and transferred to [place] by family member without incident. Patient is A/O at time of DC.

## 2020-12-24 NOTE — Discharge Instructions (Addendum)
You were evaluated in the Emergency Department and after careful evaluation, we did not find any emergent condition requiring admission or further testing in the hospital.  Your exam/testing today is overall reassuring.  We sent your urine sample for culture and will call with abnormal results.  Please return to the Emergency Department if you experience any worsening of your condition.   Thank you for allowing [place] to be a part of your care.

## 2020-12-25 ENCOUNTER — Emergency Department (HOSPITAL_COMMUNITY): Payer: Medicare Other

## 2020-12-25 LAB — COMPREHENSIVE METABOLIC PANEL
ALT: 10 U/L (ref 0–44)
AST: 13 U/L — ABNORMAL LOW (ref 15–41)
Albumin: 3.4 g/dL — ABNORMAL LOW (ref 3.5–5.0)
Alkaline Phosphatase: 128 U/L — ABNORMAL HIGH (ref 38–126)
Anion gap: 4 — ABNORMAL LOW (ref 5–15)
BUN: 15 mg/dL (ref 8–23)
CO2: 30 mmol/L (ref 22–32)
Calcium: 10 mg/dL (ref 8.9–10.3)
Chloride: 105 mmol/L (ref 98–111)
Creatinine, Ser: 0.74 mg/dL (ref 0.61–1.24)
GFR, Estimated: >60 mL/min (ref >60)
Glucose, Bld: 101 mg/dL — ABNORMAL HIGH (ref 70–99)
Potassium: 4.2 mmol/L (ref 3.5–5.1)
Sodium: 139 mmol/L (ref 135–145)
Total Bilirubin: 0.4 mg/dL (ref 0.3–1.2)
Total Protein: 6.8 g/dL (ref 6.5–8.1)

## 2020-12-25 LAB — URINALYSIS, ROUTINE W REFLEX MICROSCOPIC
Bilirubin Urine: NEGATIVE
Glucose, UA: NEGATIVE mg/dL
Ketones, ur: NEGATIVE mg/dL
Nitrite: NEGATIVE
Protein, ur: 30 mg/dL — AB
Specific Gravity, Urine: 1.017 (ref 1.005–1.030)
pH: 5 (ref 5.0–8.0)

## 2020-12-25 LAB — URINE CULTURE: Culture: NO GROWTH

## 2020-12-25 MED ORDER — DOCUSATE SODIUM 100 MG PO CAPS: 100.0000 mg | ORAL_CAPSULE | Freq: Two times a day (BID) | ORAL | 0 refills | Status: DC

## 2020-12-25 NOTE — ED Provider Notes (Signed)
[facility] DEPT Provider Note   CSN: [id number] Arrival date & time: 12/24/20  2259     History Chief Complaint  Patient presents with   Pelvic Pain    Todd Dunn is a 80 y.o. male.  80 year old male with prior medical history as detailed below presents for evaluation.  Patient with minimal ability to communicate given prior history of aphasia, stroke, and dementia.  Level 5 caveat from same.  Patient with chronic indwelling catheter.  Patient is granddaughter reports that the patient has reported discomfort.  Patient has now been waiting for evaluation for 11+ hours.  Patient appears to be comfortable on my initial evaluation.  Patient has established urology appointment today at 1230.  The history is provided by the patient, medical records and a relative.  Illness Location:  Possible abdominal pain Severity:  Mild Onset quality:  Unable to specify Timing:  Unable to specify Progression:  Unable to specify Chronicity:  Recurrent     Past Medical History:  Diagnosis Date   BPH (benign prostatic hyperplasia)    CAD (coronary artery disease)    Chronic indwelling Foley catheter    CVA (cerebral vascular accident) ([place])    HTN (hypertension)    Neurogenic bladder    SSS (sick sinus syndrome) ([place])     Patient Active Problem List   Diagnosis Date Noted   Catheter-associated urinary tract infection ([place]) [id number]   Acute metabolic encephalopathy [id number]   Chronic indwelling Foley catheter 12/15/2020   HTN (hypertension) 12/15/2020   History of ESBL E. coli infection 12/15/2020    Past Surgical History:  Procedure Laterality Date   APPENDECTOMY     CARDIAC SURGERY     PACEMAKER PLACEMENT         Family History  Family history unknown: Yes    Social History   Tobacco Use   Smoking status: Former    Types: Cigarettes  Substance Use Topics   Alcohol use: Not Currently   Drug use: Never    Home  Medications Prior to Admission medications   Medication Sig Start Date End Date Taking? Authorizing Provider  docusate sodium (COLACE) 100 MG capsule Take 1 capsule (100 mg total) by mouth every 12 (twelve) hours. 12/25/20  Yes [name], MD  acetaminophen (TYLENOL) 325 MG tablet Take 650 mg by mouth every 4 (four) hours as needed for mild pain.    [provider]  amLODipine (NORVASC) 10 MG tablet Take 10 mg by mouth daily.    [provider]  citalopram (CELEXA) 20 MG tablet Take 20 mg by mouth at bedtime.    [provider]  clopidogrel (PLAVIX) 75 MG tablet Take 75 mg by mouth at bedtime.    [provider]  FEROSUL 325 (65 Fe) MG tablet Take 325 mg by mouth daily. 12/09/20   [provider]  finasteride (PROSCAR) 5 MG tablet Take 5 mg by mouth daily.    [provider]  furosemide (LASIX) 20 MG tablet Take 20 mg by mouth daily.    [provider]  hydrALAZINE (APRESOLINE) 50 MG tablet Take 50 mg by mouth 3 (three) times daily.    [provider]  isosorbide mononitrate (IMDUR) 60 MG 24 hr tablet Take 60 mg by mouth daily.    [provider]  lisinopril (ZESTRIL) 40 MG tablet Take 40 mg by mouth daily.    [provider]  nitrofurantoin (MACRODANTIN) 50 MG capsule Take 1 capsule (50 mg  total) by mouth at bedtime. 12/19/20   [name], [name], MD  nitroGLYCERIN (NITROSTAT) 0.4 MG SL tablet Place 0.4 mg under the tongue every 5 (five) minutes as needed for chest pain.    [provider]  rosuvastatin (CRESTOR) 10 MG tablet Take 10 mg by mouth at bedtime.    [provider]  tamsulosin (FLOMAX) 0.4 MG CAPS capsule Take 0.4 mg by mouth daily.    [provider]  Vitamin D, Ergocalciferol, (DRISDOL) 1.25 MG (50000 UNIT) CAPS capsule Take 50,000 Units by mouth every Monday.    [provider]    Allergies    Aspirin, [place] qu[place]og clam (m. mercenaria) skin test, and  Penicillins  Review of Systems   Review of Systems  Unable to perform ROS: Dementia   Physical Exam Updated Vital Signs BP (!) 152/85   Pulse 63   Temp 97.7 F (36.5 C) (Oral)   Resp 18   Ht '6\' 1"'$  (1.854 m)   Wt 65.8 kg   SpO2 100%   BMI 19.13 kg/m   Physical Exam Vitals and nursing note reviewed.  Constitutional:      General: He is not in acute distress.    Appearance: Normal appearance. He is well-developed.  HENT:     Head: Normocephalic and atraumatic.  Eyes:     Conjunctiva/sclera: Conjunctivae normal.     Pupils: Pupils are equal, round, and reactive to light.  Cardiovascular:     Rate and Rhythm: Normal rate and regular rhythm.     Heart sounds: Normal heart sounds.  Pulmonary:     Effort: Pulmonary effort is normal. No respiratory distress.     Breath sounds: Normal breath sounds.  Abdominal:     General: There is no distension.     Palpations: Abdomen is soft.     Tenderness: There is no abdominal tenderness.  Genitourinary:    Comments: Foley catheter in place.  No appreciable distention of the bladder noted.  Catheter appears to be draining clear urine. Musculoskeletal:        General: No deformity. Normal range of motion.     Cervical back: Normal range of motion and neck supple.  Skin:    General: Skin is warm and dry.  Neurological:     General: No focal deficit present.     Mental Status: He is alert and oriented to person, place, and time.    ED Results / Procedures / Treatments   Labs (all labs ordered are listed, but only abnormal results are displayed) Labs Reviewed  COMPREHENSIVE METABOLIC PANEL - Abnormal; Notable for the following components:      Result Value   Glucose, Bld 101 (*)    Albumin 3.4 (*)    AST 13 (*)    Alkaline Phosphatase 128 (*)    Anion gap 4 (*)    All other components within normal limits  CBC WITH DIFFERENTIAL/PLATELET - Abnormal; Notable for the following components:   WBC 3.3 (*)    RBC 3.78 (*)     Hemoglobin 9.5 (*)    HCT 31.6 (*)    MCH 25.1 (*)    RDW 17.0 (*)    Neutro Abs 1.3 (*)    All other components within normal limits  URINALYSIS, ROUTINE W REFLEX MICROSCOPIC - Abnormal; Notable for the following components:   Hgb urine dipstick SMALL (*)    Protein, ur 30 (*)    Leukocytes,Ua LARGE (*)    Bacteria, UA RARE (*)  All other components within normal limits    EKG None  Radiology CT Renal Stone Study  Result Date: 12/25/2020 CLINICAL DATA:  Pelvic pain, dementia, neurogenic bladder with catheter, hypertension, coronary artery disease EXAM: CT ABDOMEN AND PELVIS WITHOUT CONTRAST TECHNIQUE: Multidetector CT imaging of the abdomen and pelvis was performed following the standard protocol without IV contrast. Sagittal and coronal MPR images reconstructed from axial data set. No oral contrast administered. COMPARISON:  12/15/2020 FINDINGS: Lower chest: Linear subsegmental atelectasis LEFT lower lobe. Pacemaker leads RIGHT atrium and RIGHT ventricle. Hepatobiliary: Tiny dependent gallstones in gallbladder. Liver unremarkable. Pancreas: Poorly visualized due to lack of intervening tissue planes between pancreas and bowel. Spleen: Normal appearance Adrenals/Urinary Tract: BILATERAL adrenal thickening. Multiple calcifications in the kidneys bilaterally, many of which appear to be renal vascular in origin, others potentially tiny nonobstructing calculi. Tiny hyperdense nodule at inferior pole LEFT kidney unchanged, 7 mm diameter. No definite renal mass identified on limited non-contrast assessment. Minimal dilatation of LEFT renal collecting system and LEFT ureter. No definite ureteral calculi. Indwelling Foley catheter. Bladder wall thickening. Stomach/Bowel: Increased stool throughout colon. Stomach decompressed, poorly assessed. Unopacified small bowel loops without definite dilatation, wall thickness poorly assessed. Appendix not visualized. Vascular/Lymphatic: Extensive atherosclerotic  calcifications aorta, visceral arteries, iliac arteries, coronary arteries. Aorta normal caliber. No adenopathy. Reproductive: Prostatic enlargement, gland measuring 6.4 x 4.5 x 4.5 cm (volume = 68 cm3) Other: No free air or free fluid.  No hernia. Musculoskeletal: Degenerative disc disease changes lumbar spine. IMPRESSION: Prostatic enlargement. Bladder wall thickening with indwelling Foley catheter question cystitis. Minimal dilatation of LEFT renal collecting system and LEFT ureter without definite ureteral calculus identified. Cholelithiasis. Increased stool throughout colon. Aortic Atherosclerosis (ICD10-I70.0). Electronically Signed   By: Lavonia Dana M.D.   On: 12/25/2020 11:54    Procedures Procedures   Medications Ordered in ED Medications - No data to display  ED Course  I have reviewed the triage vital signs and the nursing notes.  Pertinent labs & imaging results that were available during my care of the patient were reviewed by me and considered in my medical decision making (see chart for details).    MDM Rules/Calculators/A&P                           MDM  MSE complete  Flay L Hockersmith was evaluated in Emergency Department on 12/25/2020 for the symptoms described in the history of present illness. He was evaluated in the context of the global COVID-19 pandemic, which necessitated consideration that the patient might be at risk for infection with the SARS-CoV-2 virus that causes COVID-19. Institutional protocols and algorithms that pertain to the evaluation of patients at risk for COVID-19 are in a state of rapid change based on information released by regulatory bodies including the CDC and federal and state organizations. These policies and algorithms were followed during the patient's care in the ED.  Patient is presenting with concern for possible abdominal discomfort.  Screening labs obtained are without significant abnormality.  There is no clear evidence of a UTI at  this time.  Foley catheter appears to be correctly placed.  CT imaging did suggest that patient's reported intermittent abdominal discomfort could be secondary to constipation.  Will prescribe Colace.  Patient's granddaughter Ms. Tamala Julian is aware of need for close follow-up.  Strict return precautions given and understood. Final Clinical Impression(s) / ED Diagnoses Final diagnoses:  Generalized abdominal pain    Rx / DC  Orders ED Discharge Orders          Ordered    docusate sodium (COLACE) 100 MG capsule  Every 12 hours        12/25/20 1210             [name], MD 12/25/20 1215

## 2020-12-25 NOTE — Discharge Instructions (Addendum)
Return for any problem.   Follow up today with [facility].  Your symptoms today may be secondary to constipation. Use Colace as prescribed as a stool softener to help symptoms of constipation.

## 2020-12-29 ENCOUNTER — Telehealth: Payer: Self-pay | Admitting: Physician Assistant

## 2020-12-29 NOTE — Telephone Encounter (Signed)
Scheduled appt per referral. Pt aware.  

## 2021-01-05 NOTE — Progress Notes (Signed)
[facility] [phone]   Fax:([phone] [place] [facility] NOTE  Patient Care Team: Fredrich Romans, [place] as PCP - General (Physician Assistant)  Hematological/Oncological History 1) Labs from PCP, Fredrich Romans PA-C from [facility] 11/20/2020: Ferritin 34.80, Iron 33 (L), TIBC 385 (H), WBC 5.6, Hgb 9.6 (L), MCV 83, Plt 255, vitamin B12 324  2) 01/05/2021: Establish care with Dede Query PA-C CHIEF COMPLAINTS/PURPOSE OF CONSULTATION:  Normocytic anemia  HISTORY OF PRESENTING ILLNESS:  Todd Dunn 80 y.o. male with medical history significant for BPH, CAD, neurogenic bladder with foley catheter, CVA, sick sinus syndrome s/p pacemaker placement. Patient is accompanied by his grandson and his grandaughter, Todd Dunn, is on the phone. Patient's primary caretaker is his granddaughter and was the main historian during this visit.   On exam today, Todd Dunn has stable energy levels. He needs assistance to complete all his daily activities including self care. He uses either a wheelchair or walker to ambulate. He has a good appetite without any recent weight changes. Patient does not have any nauea, vomiting or abdominal pain. He struggles with constipation and is currently taking stool softeners with improvement. His last bowel movement was yesterday. Patient does not have any signs of bleeding hematochezia, melena or hematuria. Patient does not have any fevers, chills, night sweats, shortness of breath, chest pain or cough. He has no other complaints.  MEDICAL HISTORY:  Past Medical History:  Diagnosis Date   BPH (benign prostatic hyperplasia)    CAD (coronary artery disease)    Chronic indwelling Foley catheter    CVA (cerebral vascular accident) ([place])    HTN (hypertension)    Neurogenic bladder    SSS (sick sinus syndrome) ([place])    Vitamin D deficiency     SURGICAL HISTORY: Past Surgical History:  Procedure Laterality Date   APPENDECTOMY      CARDIAC SURGERY     PACEMAKER PLACEMENT      SOCIAL HISTORY: Social History   Socioeconomic History   Marital status: Widowed    Spouse name: Not on file   Number of children: Not on file   Years of education: Not on file   Highest education level: Not on file  Occupational History   Not on file  Tobacco Use   Smoking status: Some Days    Types: Cigarettes   Smokeless tobacco: Never  Substance and Sexual Activity   Alcohol use: Not Currently   Drug use: Never   Sexual activity: Not on file  Other Topics Concern   Not on file  Social History Narrative   Not on file   Social Determinants of Health   Financial Resource Strain: Not on file  Food Insecurity: Not on file  Transportation Needs: Not on file  Physical Activity: Not on file  Stress: Not on file  Social Connections: Not on file  Intimate Partner Violence: Not on file    FAMILY HISTORY: Family History  Family history unknown: Yes    ALLERGIES:  is allergic to aspirin, [place] qu[place]og clam (m. mercenaria) skin test, and penicillins.  MEDICATIONS:  Current Outpatient Medications  Medication Sig Dispense Refill   acetaminophen (TYLENOL) 325 MG tablet Take 650 mg by mouth every 4 (four) hours as needed for mild pain.     amLODipine (NORVASC) 10 MG tablet Take 10 mg by mouth daily.     citalopram (CELEXA) 20 MG tablet Take 20 mg by mouth at bedtime.     clopidogrel (PLAVIX) 75 MG  tablet Take 75 mg by mouth at bedtime.     docusate sodium (COLACE) 100 MG capsule Take 1 capsule (100 mg total) by mouth every 12 (twelve) hours. 60 capsule 0   finasteride (PROSCAR) 5 MG tablet Take 5 mg by mouth daily.     furosemide (LASIX) 20 MG tablet Take 20 mg by mouth daily.     hydrALAZINE (APRESOLINE) 50 MG tablet Take 50 mg by mouth 3 (three) times daily.     isosorbide mononitrate (IMDUR) 60 MG 24 hr tablet Take 60 mg by mouth daily.     lisinopril (ZESTRIL) 40 MG tablet Take 40 mg by mouth daily.     nitroGLYCERIN  (NITROSTAT) 0.4 MG SL tablet Place 0.4 mg under the tongue every 5 (five) minutes as needed for chest pain.     rosuvastatin (CRESTOR) 10 MG tablet Take 10 mg by mouth at bedtime.     tamsulosin (FLOMAX) 0.4 MG CAPS capsule Take 0.4 mg by mouth daily.     Vitamin D, Ergocalciferol, (DRISDOL) 1.25 MG (50000 UNIT) CAPS capsule Take 50,000 Units by mouth every Monday.     FEROSUL 325 (65 Fe) MG tablet Take 325 mg by mouth daily.     nitrofurantoin (MACRODANTIN) 50 MG capsule Take 1 capsule (50 mg total) by mouth at bedtime. 30 capsule 0   No current facility-administered medications for this visit.    REVIEW OF SYSTEMS:   Constitutional: ( - ) fevers, ( - )  chills , ( - ) night sweats Eyes: ( - ) blurriness of vision, ( - ) double vision, ( - ) watery eyes Ears, nose, mouth, throat, and face: ( - ) mucositis, ( - ) sore throat Respiratory: ( - ) cough, ( - ) dyspnea, ( - ) wheezes Cardiovascular: ( - ) palpitation, ( - ) chest discomfort, ( - ) lower extremity swelling Gastrointestinal:  ( - ) nausea, ( - ) heartburn, ( - ) change in bowel habits Skin: ( - ) abnormal skin rashes Lymphatics: ( - ) new lymphadenopathy, ( - ) easy bruising Neurological: ( - ) numbness, ( - ) tingling, ( - ) new weaknesses Behavioral/Psych: ( - ) mood change, ( - ) new changes  All other systems were reviewed with the patient and are negative.  PHYSICAL EXAMINATION: ECOG PERFORMANCE STATUS: 2 - Symptomatic, <50% confined to bed  Vitals:   01/06/21 1420  BP: 117/66  Pulse: 77  Resp: 17  Temp: 98.2 F (36.8 C)  SpO2: 100%   Filed Weights    GENERAL: well appearing male in NAD  SKIN: skin color, texture, turgor are normal, no rashes or significant lesions EYES: conjunctiva are pink and non-injected, sclera clear OROPHARYNX: no exudate, no erythema; lips, buccal mucosa, and tongue normal  LYMPH:  no palpable lymphadenopathy in the cervical or supraclavicular lymph nodes.  LUNGS: clear to auscultation  and percussion with normal breathing effort HEART: regular rate & rhythm and no murmurs and no lower extremity edema ABDOMEN: soft, non-tender, non-distended, normal bowel sounds Musculoskeletal: no cyanosis of digits and no clubbing  PSYCH: alert & oriented x 3, fluent speech NEURO: no focal motor/sensory deficits  LABORATORY DATA:  I have reviewed the data as listed CBC Latest Ref Rng & Units 01/06/2021 12/24/2020 12/23/2020  WBC 4.0 - 10.5 K/uL 3.2(L) 3.3(L) 4.3  Hemoglobin 13.0 - 17.0 g/dL 10.1(L) 9.5(L) 9.2(L)  Hematocrit 39.0 - 52.0 % 32.6(L) 31.6(L) 31.4(L)  Platelets 150 - 400 K/uL 158 217 245  CMP Latest Ref Rng & Units 01/06/2021 12/24/2020 12/23/2020  Glucose 70 - 99 mg/dL 91 101(H) 107(H)  BUN 8 - 23 mg/dL 14 15 16   Creatinine 0.61 - 1.24 mg/dL 0.79 0.74 0.87  Sodium 135 - 145 mmol/L 142 139 138  Potassium 3.5 - 5.1 mmol/L 3.8 4.2 3.9  Chloride 98 - 111 mmol/L 106 105 103  CO2 22 - 32 mmol/L 31 30 28   Calcium 8.9 - 10.3 mg/dL 9.9 10.0 9.8  Total Protein 6.5 - 8.1 g/dL 6.9 6.8 6.5  Total Bilirubin 0.3 - 1.2 mg/dL 0.3 0.4 0.5  Alkaline Phos 38 - 126 U/L 159(H) 128(H) 128(H)  AST 15 - 41 U/L 10(L) 13(L) 11(L)  ALT 0 - 44 U/L 10 10 9     ASSESSMENT & PLAN Todd Dunn is a 80 y.o. male who presents for an evaluation for normocytic anemia. Based on prior labs, Todd Dunn has anemia since April 2022. His hemoglobin levels range from 9-10 and MCV has been normal. Patient's granddaugther reports that patient refuses to take iron tablets as he feels it is the cause of diarrhea he had in the past. We will proceed with labs today to check CBC, CMP, iron and TIBC, ferritin, retic, vitamin B12, MMA and folate levels. If workup is negative, we will request patient to return for further diagnostic testing.   #Normocytic anemia: --Most recent labs from 12/24/2020 revealed hemoglobin at 9.5 and MCV 83.6.  --Patient denies any signs of bleeding and not taking iron tablets --Labs  today to check CBC, CMP, iron and TIBC, ferritin, retic, vitamin B12, MMA and folate levels --RTC based on above workup.   Orders Placed This Encounter  Procedures   Ferritin    Standing Status:   Future    Number of Occurrences:   1    Standing Expiration Date:   01/06/2022   Iron and TIBC    Standing Status:   Future    Number of Occurrences:   1    Standing Expiration Date:   01/06/2022   Retic Panel    Standing Status:   Future    Number of Occurrences:   1    Standing Expiration Date:   01/06/2022   Vitamin B12    Standing Status:   Future    Number of Occurrences:   1    Standing Expiration Date:   01/06/2022   Methylmalonic acid, serum    Standing Status:   Future    Number of Occurrences:   1    Standing Expiration Date:   01/06/2022   Folate, Serum    Standing Status:   Future    Number of Occurrences:   1    Standing Expiration Date:   01/06/2022   CBC with Differential ([facility] Only)    Standing Status:   Future    Number of Occurrences:   1    Standing Expiration Date:   01/06/2022   CMP ([facility] only)    Standing Status:   Future    Number of Occurrences:   1    Standing Expiration Date:   01/06/2022    All questions were answered. The patient knows to call the clinic with any problems, questions or concerns.  I have spent a total of 60 minutes minutes of face-to-face and non-face-to-face time, preparing to see the patient, obtaining and/or reviewing separately obtained history, performing a medically appropriate examination, counseling and educating the patient, ordering tests, documenting clinical information in  the electronic health record, and care coordination.   Dede Query, PA-C Department of Hematology/Oncology [place] at [facility] Phone: [phone]  Patient was seen with Dr. Lorenso Dunn.   I have read the above note and personally examined the patient. I agree with the assessment and plan as noted above.  Briefly Mr.  Kendrell Dunn is a 80 year old male with medical history significant for normocytic anemia who presents for evaluation.  At this time findings are concerning for iron deficiency anemia given his prior iron labs.  Today we will repeat a full nutritional panel in order to evaluate for a nutritional cause.  In the event his nutritional panel is without abnormality we will need to consider further work-up to include hemolysis work-up and possibly bone marrow biopsy.   Ledell Peoples, MD Department of Hematology/Oncology [place] at [facility] Phone: [phone] Pager: [phone] Email: [email or web address]

## 2021-01-06 ENCOUNTER — Encounter: Payer: Self-pay | Admitting: Physician Assistant

## 2021-01-06 ENCOUNTER — Other Ambulatory Visit: Payer: Self-pay

## 2021-01-06 ENCOUNTER — Inpatient Hospital Stay: Payer: Medicare Other | Admitting: Physician Assistant

## 2021-01-06 ENCOUNTER — Encounter: Payer: Self-pay | Admitting: *Deleted

## 2021-01-06 ENCOUNTER — Inpatient Hospital Stay: Payer: Medicare Other | Attending: Physician Assistant | Admitting: Physician Assistant

## 2021-01-06 ENCOUNTER — Emergency Department (HOSPITAL_COMMUNITY)
Admission: EM | Admit: 2021-01-06 | Discharge: 2021-01-07 | Disposition: A | Discharge status: Home / Self Care | Payer: Medicare Other | Attending: Emergency Medicine | Admitting: Emergency Medicine

## 2021-01-06 VITALS — BP 117/66 | HR 77 | Temp 98.2°F | Resp 17

## 2021-01-06 DIAGNOSIS — Z79899 Other long term (current) drug therapy: Secondary | ICD-10-CM | POA: Insufficient documentation

## 2021-01-06 DIAGNOSIS — D649 Anemia, unspecified: Secondary | ICD-10-CM | POA: Insufficient documentation

## 2021-01-06 DIAGNOSIS — F17210 Nicotine dependence, cigarettes, uncomplicated: Secondary | ICD-10-CM | POA: Diagnosis not present

## 2021-01-06 DIAGNOSIS — I2510 Atherosclerotic heart disease of native coronary artery without angina pectoris: Secondary | ICD-10-CM | POA: Diagnosis not present

## 2021-01-06 DIAGNOSIS — D72819 Decreased white blood cell count, unspecified: Secondary | ICD-10-CM | POA: Diagnosis not present

## 2021-01-06 DIAGNOSIS — I10 Essential (primary) hypertension: Secondary | ICD-10-CM | POA: Diagnosis not present

## 2021-01-06 DIAGNOSIS — Z7902 Long term (current) use of antithrombotics/antiplatelets: Secondary | ICD-10-CM | POA: Diagnosis not present

## 2021-01-06 DIAGNOSIS — R300 Dysuria: Secondary | ICD-10-CM | POA: Diagnosis present

## 2021-01-06 DIAGNOSIS — N390 Urinary tract infection, site not specified: Secondary | ICD-10-CM | POA: Diagnosis not present

## 2021-01-06 LAB — CBC WITH DIFFERENTIAL (CANCER CENTER ONLY)
Abs Immature Granulocytes: 0.01 10*3/uL (ref 0.00–0.07)
Basophils Absolute: 0 10*3/uL (ref 0.0–0.1)
Basophils Relative: 1 %
Eosinophils Absolute: 0 10*3/uL (ref 0.0–0.5)
Eosinophils Relative: 0 %
HCT: 32.6 % — ABNORMAL LOW (ref 39.0–52.0)
Hemoglobin: 10.1 g/dL — ABNORMAL LOW (ref 13.0–17.0)
Immature Granulocytes: 0 %
Lymphocytes Relative: 40 %
Lymphs Abs: 1.3 10*3/uL (ref 0.7–4.0)
MCH: 25.3 pg — ABNORMAL LOW (ref 26.0–34.0)
MCHC: 31 g/dL (ref 30.0–36.0)
MCV: 81.7 fL (ref 80.0–100.0)
Monocytes Absolute: 0.4 10*3/uL (ref 0.1–1.0)
Monocytes Relative: 12 %
Neutro Abs: 1.5 10*3/uL — ABNORMAL LOW (ref 1.7–7.7)
Neutrophils Relative %: 47 %
Platelet Count: 158 10*3/uL (ref 150–400)
RBC: 3.99 MIL/uL — ABNORMAL LOW (ref 4.22–5.81)
RDW: 18.1 % — ABNORMAL HIGH (ref 11.5–15.5)
WBC Count: 3.2 10*3/uL — ABNORMAL LOW (ref 4.0–10.5)
nRBC: 0 % (ref 0.0–0.2)

## 2021-01-06 LAB — RETIC PANEL
Immature Retic Fract: 13.1 % (ref 2.3–15.9)
RBC.: 4.04 MIL/uL — ABNORMAL LOW (ref 4.22–5.81)
Retic Count, Absolute: 57.8 10*3/uL (ref 19.0–186.0)
Retic Ct Pct: 1.4 % (ref 0.4–3.1)
Reticulocyte Hemoglobin: 27.5 pg — ABNORMAL LOW (ref >27.9)

## 2021-01-06 LAB — CMP (CANCER CENTER ONLY)
ALT: 10 U/L (ref 0–44)
AST: 10 U/L — ABNORMAL LOW (ref 15–41)
Albumin: 3.2 g/dL — ABNORMAL LOW (ref 3.5–5.0)
Alkaline Phosphatase: 159 U/L — ABNORMAL HIGH (ref 38–126)
Anion gap: 5 (ref 5–15)
BUN: 14 mg/dL (ref 8–23)
CO2: 31 mmol/L (ref 22–32)
Calcium: 9.9 mg/dL (ref 8.9–10.3)
Chloride: 106 mmol/L (ref 98–111)
Creatinine: 0.79 mg/dL (ref 0.61–1.24)
GFR, Estimated: >60 mL/min (ref >60)
Glucose, Bld: 91 mg/dL (ref 70–99)
Potassium: 3.8 mmol/L (ref 3.5–5.1)
Sodium: 142 mmol/L (ref 135–145)
Total Bilirubin: 0.3 mg/dL (ref 0.3–1.2)
Total Protein: 6.9 g/dL (ref 6.5–8.1)

## 2021-01-06 LAB — VITAMIN B12: Vitamin B-12: 183 pg/mL (ref 180–914)

## 2021-01-06 LAB — FOLATE: Folate: 13.8 ng/mL (ref >5.9)

## 2021-01-06 NOTE — ED Provider Notes (Signed)
Emergency Medicine Provider Triage Evaluation Note  Todd Dunn , a 80 y.o. male  was evaluated in triage.  Pt complains of area for several days.  Patient is a poor historian.  Reports he has had a catheter for more than a year and it was recently changed.  He reports he always has some dysuria but it is worse for the last several days.  Denies fevers, chills, nausea, vomiting. Pt reports he lives with his granddaughter and they think he is "crazy."  Review of Systems  Positive: Dysuria Negative: Fever, abdominal pain, nausea, vomiting  Physical Exam  BP (!) 147/81 (BP Location: Right Arm)   Pulse 78   Temp 97.7 F (36.5 C) (Oral)   Resp 19   SpO2 100%  Gen:   Awake, no distress   Resp:  Normal effort  MSK:   Moves extremities without difficulty  Other:  Urinary catheter in place.  No discharge at the tip of the penis.  Urine in the leg bag with moderate amount of sediment.  Chaperone present for GU exam.  Medical Decision Making  Medically screening exam initiated at 10:27 PM.  Appropriate orders placed.  Todd Dunn was informed that the remainder of the evaluation will be completed by another provider, this initial triage assessment does not replace that evaluation, and the importance of remaining in the ED until their evaluation is complete.  Concern for UTI.  Possibly AMS.  Unable to reach family on first attempt.   Todd Dunn, Gwenlyn Perking 01/06/21 2230    Blanchie Dessert, MD 01/06/21 [phone]

## 2021-01-06 NOTE — ED Triage Notes (Signed)
Pt with chronic foley c/o urianry problems. Denies abdominal pain. Upon further questioning pt stastes he has the same problem he was having yesterday with his foley.

## 2021-01-07 LAB — URINALYSIS, ROUTINE W REFLEX MICROSCOPIC
Bilirubin Urine: NEGATIVE
Glucose, UA: NEGATIVE mg/dL
Ketones, ur: NEGATIVE mg/dL
Nitrite: POSITIVE — AB
Protein, ur: 100 mg/dL — AB
Specific Gravity, Urine: 1.017 (ref 1.005–1.030)
WBC, UA: >50 WBC/hpf — ABNORMAL HIGH (ref 0–5)
pH: 8 (ref 5.0–8.0)

## 2021-01-07 LAB — CBC
HCT: 32.3 % — ABNORMAL LOW (ref 39.0–52.0)
Hemoglobin: 9.7 g/dL — ABNORMAL LOW (ref 13.0–17.0)
MCH: 25.2 pg — ABNORMAL LOW (ref 26.0–34.0)
MCHC: 30 g/dL (ref 30.0–36.0)
MCV: 83.9 fL (ref 80.0–100.0)
Platelets: 182 10*3/uL (ref 150–400)
RBC: 3.85 MIL/uL — ABNORMAL LOW (ref 4.22–5.81)
RDW: 18.3 % — ABNORMAL HIGH (ref 11.5–15.5)
WBC: 3.8 10*3/uL — ABNORMAL LOW (ref 4.0–10.5)
nRBC: 0 % (ref 0.0–0.2)

## 2021-01-07 LAB — BASIC METABOLIC PANEL
Anion gap: 6 (ref 5–15)
BUN: 11 mg/dL (ref 8–23)
CO2: 28 mmol/L (ref 22–32)
Calcium: 9.5 mg/dL (ref 8.9–10.3)
Chloride: 103 mmol/L (ref 98–111)
Creatinine, Ser: 0.74 mg/dL (ref 0.61–1.24)
GFR, Estimated: >60 mL/min (ref >60)
Glucose, Bld: 130 mg/dL — ABNORMAL HIGH (ref 70–99)
Potassium: 3.7 mmol/L (ref 3.5–5.1)
Sodium: 137 mmol/L (ref 135–145)

## 2021-01-07 LAB — IRON AND TIBC
Iron: 72 ug/dL (ref 42–163)
Saturation Ratios: 24 % (ref 20–55)
TIBC: 301 ug/dL (ref 202–409)
UIBC: 230 ug/dL (ref 117–376)

## 2021-01-07 LAB — FERRITIN: Ferritin: 31 ng/mL (ref 24–336)

## 2021-01-07 MED ORDER — CEPHALEXIN 250 MG/5ML PO SUSR: 500.0000 mg | Freq: Three times a day (TID) | ORAL | 0 refills | Status: DC

## 2021-01-07 MED ORDER — CEPHALEXIN 250 MG PO CAPS: 500.0000 mg | ORAL_CAPSULE | Freq: Once | ORAL | Status: DC

## 2021-01-07 MED ORDER — CEPHALEXIN 250 MG/5ML PO SUSR
500.0000 mg | Freq: Once | ORAL | Status: AC
  Administered 2021-01-07: 500 mg via ORAL
  Filled 2021-01-07: qty 10

## 2021-01-07 NOTE — ED Notes (Signed)
Called [name] to discuss d/c paperwork

## 2021-01-07 NOTE — ED Notes (Signed)
Patient verbalizes understanding of discharge instructions. Opportunity for questioning and answers were provided. Armband removed by staff, pt discharged from ED via wheelchair to lobby to go home with family.   

## 2021-01-07 NOTE — ED Provider Notes (Signed)
[facility] EMERGENCY DEPARTMENT Provider Note   CSN: [id number] Arrival date & time: 01/06/21  2149     History Chief Complaint  Patient presents with   UTI Symptoms    Todd Dunn is a 80 y.o. male.  The history is provided by the patient.  He has history of hypertension, chronic indwelling Foley catheter with urinary tract infections, stroke and he comes in with concerns about another UTI.  He is a very poor historian, states that it hurts when he urinates, even though he has an indwelling catheter.  He denies fever, chills, sweats.  There has been no nausea or vomiting.  Past Medical History:  Diagnosis Date   BPH (benign prostatic hyperplasia)    CAD (coronary artery disease)    Chronic indwelling Foley catheter    CVA (cerebral vascular accident) ([place])    HTN (hypertension)    Neurogenic bladder    SSS (sick sinus syndrome) ([place])    Vitamin D deficiency     Patient Active Problem List   Diagnosis Date Noted   Normocytic anemia 01/06/2021   Catheter-associated urinary tract infection ([place]) [id number]   Acute metabolic encephalopathy [id number]   Chronic indwelling Foley catheter 12/15/2020   HTN (hypertension) 12/15/2020   History of ESBL E. coli infection 12/15/2020    Past Surgical History:  Procedure Laterality Date   APPENDECTOMY     CARDIAC SURGERY     PACEMAKER PLACEMENT         Family History  Family history unknown: Yes    Social History   Tobacco Use   Smoking status: Some Days    Types: Cigarettes   Smokeless tobacco: Never  Substance Use Topics   Alcohol use: Not Currently   Drug use: Never    Home Medications Prior to Admission medications   Medication Sig Start Date End Date Taking? Authorizing Provider  cephALEXin (KEFLEX) 250 MG/5ML suspension Take 10 mLs (500 mg total) by mouth 3 (three) times daily. [id number]  Yes Delora Fuel, MD  acetaminophen (TYLENOL) 325 MG tablet Take 650 mg by mouth every 4 (four)  hours as needed for mild pain.    [provider]  amLODipine (NORVASC) 10 MG tablet Take 10 mg by mouth daily.    [provider]  citalopram (CELEXA) 20 MG tablet Take 20 mg by mouth at bedtime.    [provider]  clopidogrel (PLAVIX) 75 MG tablet Take 75 mg by mouth at bedtime.    [provider]  docusate sodium (COLACE) 100 MG capsule Take 1 capsule (100 mg total) by mouth every 12 (twelve) hours. 12/25/20   [name], MD  FEROSUL 325 (65 Fe) MG tablet Take 325 mg by mouth daily. 12/09/20   [provider]  finasteride (PROSCAR) 5 MG tablet Take 5 mg by mouth daily.    [provider]  furosemide (LASIX) 20 MG tablet Take 20 mg by mouth daily.    [provider]  hydrALAZINE (APRESOLINE) 50 MG tablet Take 50 mg by mouth 3 (three) times daily.    [provider]  isosorbide mononitrate (IMDUR) 60 MG 24 hr tablet Take 60 mg by mouth daily.    [provider]  lisinopril (ZESTRIL) 40 MG tablet Take 40 mg by mouth daily.    [provider]  nitroGLYCERIN (NITROSTAT) 0.4 MG SL tablet Place 0.4 mg under the tongue every 5 (five) minutes as needed for chest pain.    [provider]  rosuvastatin (CRESTOR) 10 MG tablet Take 10 mg by mouth at bedtime.    [provider]  tamsulosin (FLOMAX) 0.4 MG CAPS capsule Take 0.4 mg by mouth daily.    [provider]  Vitamin D, Ergocalciferol, (DRISDOL) 1.25 MG (50000 UNIT) CAPS capsule Take 50,000 Units by mouth every Monday.    [provider]    Allergies    Aspirin, Northern quahog clam (m. mercenaria) skin test, and Penicillins  Review of Systems   Review of Systems  All other systems reviewed and are negative.  Physical Exam Updated Vital Signs BP 108/71 (BP Location: Left Arm)   Pulse 63   Temp 97.7 F (36.5 C) (Oral)   Resp 18   SpO2 100%   Physical Exam Vitals and nursing note reviewed.  80 year old male,  resting comfortably and in no acute distress. Vital signs are normal. Oxygen saturation is 100%, which is normal. Head is normocephalic and atraumatic. PERRLA, EOMI. Oropharynx is clear. Neck is nontender and supple without adenopathy or JVD. Back is nontender and there is no CVA tenderness. Lungs are clear without rales, wheezes, or rhonchi. Chest is nontender. Heart has regular rate and rhythm without murmur. Abdomen is soft, flat, nontender without masses or hepatosplenomegaly and peristalsis is normoactive. Extremities have no cyanosis or edema, full range of motion is present. Skin is warm and dry without rash. Neurologic: Mental status is normal, cranial nerves are intact, there are no motor or sensory deficits.  ED Results / Procedures / Treatments   Labs (all labs ordered are listed, but only abnormal results are displayed) Labs Reviewed  CBC - Abnormal; Notable for the following components:      Result Value   WBC 3.8 (*)    RBC 3.85 (*)    Hemoglobin 9.7 (*)    HCT 32.3 (*)    MCH 25.2 (*)    RDW 18.3 (*)    All other components within normal limits  BASIC METABOLIC PANEL - Abnormal; Notable for the following components:   Glucose, Bld 130 (*)    All other components within normal limits  URINALYSIS, ROUTINE W REFLEX MICROSCOPIC - Abnormal; Notable for the following components:   APPearance CLOUDY (*)    Hgb urine dipstick SMALL (*)    Protein, ur 100 (*)    Nitrite POSITIVE (*)    Leukocytes,Ua MODERATE (*)    WBC, UA >50 (*)    Bacteria, UA FEW (*)    All other components within normal limits  URINE CULTURE   Procedures Procedures   Medications Ordered in ED Medications  cephALEXin (KEFLEX) 250 MG/5ML suspension 500 mg (has no administration in time range)    ED Course  I have reviewed the triage vital signs and the nursing notes.  Pertinent lab results that were available during my care of the patient were reviewed by me and considered in my medical  decision making (see chart for details).   MDM Rules/Calculators/A&P                         Persistent UTI in patient with chronic indwelling Foley catheter.  Urinalysis is consistent with UTI with positive nitrite, greater than 50 WBCs.  CBC shows leukopenia and normocytic anemia which are unchanged from baseline.  Renal function is normal.  Old records reviewed confirming recent UTI with Proteus mirabilis which was sensitive to multiple agents including cephalosporins.  He is discharged with a prescription for cephalexin  and is to follow-up with PCP in 1 week.  Final Clinical Impression(s) / ED Diagnoses Final diagnoses:  Urinary tract infection without hematuria, site unspecified  Normocytic anemia  Leukopenia, unspecified type    Rx / DC Orders ED Discharge Orders          Ordered    cephALEXin (KEFLEX) 250 MG/5ML suspension  3 times daily        01/07/21 [id number]             Delora Fuel, MD 0000000 [phone]

## 2021-01-07 NOTE — ED Notes (Signed)
Called Todd Dunn to inform her pt is d/c and will be waiting in the lobby.

## 2021-01-07 NOTE — ED Notes (Signed)
Called granddaughter, [name], to inform her pt will be d/c after medication is given. Ms. Tamala Julian would prefer to contacted once pt is wheelchair and ready to leave due to transportation arrangements.

## 2021-01-08 ENCOUNTER — Telehealth: Payer: Self-pay | Admitting: Physician Assistant

## 2021-01-08 LAB — METHYLMALONIC ACID, SERUM: Methylmalonic Acid, Quantitative: 183 nmol/L (ref 0–378)

## 2021-01-08 MED ORDER — VITAMIN B-12 1000 MCG PO TABS: 1000.0000 ug | ORAL_TABLET | Freq: Every day | ORAL | 3 refills | Status: DC

## 2021-01-08 NOTE — Telephone Encounter (Signed)
Ms. Todd Dunn returned the call and has been advised as indicated. She expressed understanding of this information.

## 2021-01-08 NOTE — Telephone Encounter (Signed)
I called Ms. Todd Dunn, patient's primary caretaker and granddaughter but was unable to reach her.  I left a voicemail for Ms. Todd Dunn to return my call.  Labs from 01/06/2021 reveal normocytic anemia with a hemoglobin of 10.1, MCV 81.7.  There is no evidence of iron or folate deficiency.  There is evidence of vitamin B12 deficiency.  Recommend patient to start oral vitamin B12 1000 mcg once daily.  We will send a prescription to pharmacy that is on file.  Patient will return to the clinic in 3 months with repeat labs.

## 2021-01-09 LAB — URINE CULTURE
Culture: >=100000 — AB
Special Requests: NORMAL

## 2021-01-10 ENCOUNTER — Telehealth: Payer: Self-pay | Admitting: Emergency Medicine

## 2021-01-10 NOTE — Telephone Encounter (Signed)
Post ED Visit - Positive Culture Follow-up  Culture report reviewed by antimicrobial stewardship pharmacist: [place] Team '[]'$  Elenor Quinones, Pharm.D. '[]'$  Heide Guile, Pharm.D., BCPS AQ-ID '[]'$  Parks Neptune, Pharm.D., BCPS '[]'$  Alycia Rossetti, Pharm.D., BCPS '[]'$  [place], Pharm.D., BCPS, AAHIVP '[]'$  Legrand Como, Pharm.D., BCPS, AAHIVP '[]'$  Salome Arnt, PharmD, BCPS '[]'$  Johnnette Gourd, PharmD, BCPS '[]'$  Hughes Better, PharmD, BCPS '[x]'$  Joetta Manners, PharmD '[]'$  Laqueta Linden, PharmD, BCPS '[]'$  Albertina Parr, PharmD  [place] Team '[]'$  Leodis Sias, PharmD '[]'$  Lindell Spar, PharmD '[]'$  Royetta Asal, PharmD '[]'$  Graylin Shiver, Rph '[]'$  Rema Fendt) Glennon Mac, PharmD '[]'$  Arlyn Dunning, PharmD '[]'$  Netta Cedars, PharmD '[]'$  Dia Sitter, PharmD '[]'$  Leone Haven, PharmD '[]'$  Gretta Arab, PharmD '[]'$  Theodis Shove, PharmD '[]'$  Peggyann Juba, PharmD '[]'$  Reuel Boom, PharmD   Positive urine culture Treated with Cephalexin, organism sensitive to the same and no further patient follow-up is required at this time.  [place] 01/10/2021, 1:08 PM

## 2021-01-14 NOTE — Progress Notes (Signed)
This encounter was created in error - please disregard.

## 2021-01-21 ENCOUNTER — Emergency Department (HOSPITAL_COMMUNITY)
Admission: EM | Admit: 2021-01-21 | Discharge: 2021-01-21 | Disposition: A | Discharge status: Home / Self Care | Payer: Medicare Other | Attending: Emergency Medicine | Admitting: Emergency Medicine

## 2021-01-21 ENCOUNTER — Encounter (HOSPITAL_COMMUNITY): Payer: Self-pay

## 2021-01-21 ENCOUNTER — Emergency Department (HOSPITAL_COMMUNITY): Payer: Medicare Other

## 2021-01-21 ENCOUNTER — Other Ambulatory Visit: Payer: Self-pay

## 2021-01-21 DIAGNOSIS — R319 Hematuria, unspecified: Secondary | ICD-10-CM

## 2021-01-21 DIAGNOSIS — Z950 Presence of cardiac pacemaker: Secondary | ICD-10-CM | POA: Insufficient documentation

## 2021-01-21 DIAGNOSIS — I2510 Atherosclerotic heart disease of native coronary artery without angina pectoris: Secondary | ICD-10-CM | POA: Insufficient documentation

## 2021-01-21 DIAGNOSIS — N3090 Cystitis, unspecified without hematuria: Secondary | ICD-10-CM | POA: Insufficient documentation

## 2021-01-21 DIAGNOSIS — Z79899 Other long term (current) drug therapy: Secondary | ICD-10-CM | POA: Diagnosis not present

## 2021-01-21 DIAGNOSIS — B9689 Other specified bacterial agents as the cause of diseases classified elsewhere: Secondary | ICD-10-CM | POA: Insufficient documentation

## 2021-01-21 DIAGNOSIS — F17210 Nicotine dependence, cigarettes, uncomplicated: Secondary | ICD-10-CM | POA: Insufficient documentation

## 2021-01-21 DIAGNOSIS — Z7902 Long term (current) use of antithrombotics/antiplatelets: Secondary | ICD-10-CM | POA: Insufficient documentation

## 2021-01-21 DIAGNOSIS — I10 Essential (primary) hypertension: Secondary | ICD-10-CM | POA: Diagnosis not present

## 2021-01-21 DIAGNOSIS — Z960 Presence of urogenital implants: Secondary | ICD-10-CM | POA: Diagnosis not present

## 2021-01-21 LAB — CBC WITH DIFFERENTIAL/PLATELET
Abs Immature Granulocytes: 0.01 10*3/uL (ref 0.00–0.07)
Basophils Absolute: 0 10*3/uL (ref 0.0–0.1)
Basophils Relative: 1 %
Eosinophils Absolute: 0 10*3/uL (ref 0.0–0.5)
Eosinophils Relative: 0 %
HCT: 37 % — ABNORMAL LOW (ref 39.0–52.0)
Hemoglobin: 11.3 g/dL — ABNORMAL LOW (ref 13.0–17.0)
Immature Granulocytes: 0 %
Lymphocytes Relative: 46 %
Lymphs Abs: 2.1 10*3/uL (ref 0.7–4.0)
MCH: 25.4 pg — ABNORMAL LOW (ref 26.0–34.0)
MCHC: 30.5 g/dL (ref 30.0–36.0)
MCV: 83.1 fL (ref 80.0–100.0)
Monocytes Absolute: 0.4 10*3/uL (ref 0.1–1.0)
Monocytes Relative: 9 %
Neutro Abs: 2 10*3/uL (ref 1.7–7.7)
Neutrophils Relative %: 44 %
Platelets: 237 10*3/uL (ref 150–400)
RBC: 4.45 MIL/uL (ref 4.22–5.81)
RDW: 17.4 % — ABNORMAL HIGH (ref 11.5–15.5)
WBC: 4.7 10*3/uL (ref 4.0–10.5)
nRBC: 0 % (ref 0.0–0.2)

## 2021-01-21 LAB — COMPREHENSIVE METABOLIC PANEL
ALT: 10 U/L (ref 0–44)
AST: 13 U/L — ABNORMAL LOW (ref 15–41)
Albumin: 3.1 g/dL — ABNORMAL LOW (ref 3.5–5.0)
Alkaline Phosphatase: 136 U/L — ABNORMAL HIGH (ref 38–126)
Anion gap: 6 (ref 5–15)
BUN: 15 mg/dL (ref 8–23)
CO2: 28 mmol/L (ref 22–32)
Calcium: 9.9 mg/dL (ref 8.9–10.3)
Chloride: 105 mmol/L (ref 98–111)
Creatinine, Ser: 1.02 mg/dL (ref 0.61–1.24)
GFR, Estimated: >60 mL/min (ref >60)
Glucose, Bld: 106 mg/dL — ABNORMAL HIGH (ref 70–99)
Potassium: 3.3 mmol/L — ABNORMAL LOW (ref 3.5–5.1)
Sodium: 139 mmol/L (ref 135–145)
Total Bilirubin: 1 mg/dL (ref 0.3–1.2)
Total Protein: 6.5 g/dL (ref 6.5–8.1)

## 2021-01-21 LAB — URINALYSIS, ROUTINE W REFLEX MICROSCOPIC
Bilirubin Urine: NEGATIVE
Glucose, UA: NEGATIVE mg/dL
Ketones, ur: NEGATIVE mg/dL
Nitrite: POSITIVE — AB
Protein, ur: NEGATIVE mg/dL
RBC / HPF: >50 RBC/hpf — ABNORMAL HIGH (ref 0–5)
Specific Gravity, Urine: 1.013 (ref 1.005–1.030)
WBC, UA: >50 WBC/hpf — ABNORMAL HIGH (ref 0–5)
pH: 7 (ref 5.0–8.0)

## 2021-01-21 LAB — PROTIME-INR
INR: 1.1 (ref 0.8–1.2)
Prothrombin Time: 13.8 seconds (ref 11.4–15.2)

## 2021-01-21 LAB — TYPE AND SCREEN
ABO/RH(D): A NEG
Antibody Screen: NEGATIVE

## 2021-01-21 MED ORDER — IOHEXOL 350 MG/ML SOLN
100.0000 mL | Freq: Once | INTRAVENOUS | Status: AC | PRN
  Administered 2021-01-21: 100 mL via INTRAVENOUS

## 2021-01-21 MED ORDER — POTASSIUM CHLORIDE 10 MEQ/100ML IV SOLN
10.0000 meq | Freq: Once | INTRAVENOUS | Status: AC
  Administered 2021-01-21: 10 meq via INTRAVENOUS
  Filled 2021-01-21: qty 100

## 2021-01-21 MED ORDER — SODIUM CHLORIDE 0.9 % IV SOLN
1.0000 g | Freq: Once | INTRAVENOUS | Status: AC
  Administered 2021-01-21: 1 g via INTRAVENOUS
  Filled 2021-01-21: qty 10

## 2021-01-21 MED ORDER — SODIUM CHLORIDE 0.9 % IV BOLUS
1000.0000 mL | Freq: Once | INTRAVENOUS | Status: AC
  Administered 2021-01-21: 1000 mL via INTRAVENOUS

## 2021-01-21 MED ORDER — FOSFOMYCIN TROMETHAMINE 3 G PO PACK: 3.0000 g | PACK | Freq: Once | ORAL | 0 refills | Status: AC

## 2021-01-21 NOTE — ED Notes (Signed)
Provider at bedside

## 2021-01-21 NOTE — ED Notes (Signed)
Patient verbalizes understanding of discharge instructions. Prescriptions and follow-up care reviewed with pt and niece. Leg bag placed on  pt. Opportunity for questioning and answers were provided. Armband removed by staff, pt discharged from ED via wheelchair.

## 2021-01-21 NOTE — Discharge Instructions (Addendum)
We gave Todd Dunn a round of IV antibiotics for possible urine infection.  I also prescribed him a single dose antibiotic called fosfomycin.  You can pick this up tomorrow and mix it with some water to give to him.  We were able to exchange his Foley catheter in the ER.  There may still be some blood in the urine or some pink-tinged urine for the next few days.  It is important that he follows up with his urologist for this this bleeding issue and for his recurring infections.  His hemoglobin blood count was normal today.  It did not show signs of significant blood loss.  We also gave Todd Dunn some IV fluids today for low blood pressure.  His blood pressure improved quickly after the fluids, and stayed normal.  Please continue to encourage him to drink lots of water at home.

## 2021-01-21 NOTE — ED Notes (Signed)
Patient transported to CT 

## 2021-01-21 NOTE — ED Provider Notes (Signed)
[facility] EMERGENCY DEPARTMENT Provider Note   CSN: [id number] Arrival date & time: 01/21/21  1238     History Chief Complaint  Patient presents with   Hematuria    Todd Dunn is a 80 y.o. male presenting to emergency department with blood in his urine.  He has a chronic indwelling Foley catheter.  He reports that the urine turned bloody today.  He is a poor historian, and tells me only that he's had abdominal discomfort.  He has a a history of recurrent urine infections, most recently in August 2 and recurring, over 100,000 colony units of Proteus Mirabella's, which was multidrug-resistant but treatable with keflex, which he was started on.  His granddaughter by phone tells me that the patient was complaining of lower abdominal pain for the past 2 weeks.  She is not sure whether this may be UTI, though she reports it is possible.  She said normally his blood pressure runs on the high side, not low.  She reports his foley was last changed on 01/05/21, prior to his ED visit on 8/3 and treatment with keflex for a UTI.  She feels his pain got better after keflex but then returned a few days after completing his course.  Last ed visit 01/08/21 hgb 10.1  HPI     Past Medical History:  Diagnosis Date   BPH (benign prostatic hyperplasia)    CAD (coronary artery disease)    Chronic indwelling Foley catheter    CVA (cerebral vascular accident) ([place])    HTN (hypertension)    Neurogenic bladder    SSS (sick sinus syndrome) ([place])    Vitamin D deficiency     Patient Active Problem List   Diagnosis Date Noted   Normocytic anemia 01/06/2021   Catheter-associated urinary tract infection ([place]) A999333   Acute metabolic encephalopathy A999333   Chronic indwelling Foley catheter 12/15/2020   HTN (hypertension) 12/15/2020   History of ESBL E. coli infection 12/15/2020    Past Surgical History:  Procedure Laterality Date   APPENDECTOMY     CARDIAC SURGERY      PACEMAKER PLACEMENT         Family History  Family history unknown: Yes    Social History   Tobacco Use   Smoking status: Some Days    Types: Cigarettes   Smokeless tobacco: Never  Substance Use Topics   Alcohol use: Not Currently   Drug use: Never    Home Medications Prior to Admission medications   Medication Sig Start Date End Date Taking? Authorizing Provider  acetaminophen (TYLENOL) 325 MG tablet Take 650 mg by mouth every 4 (four) hours as needed for mild pain.   Yes [provider]  amLODipine (NORVASC) 10 MG tablet Take 10 mg by mouth daily.   Yes [provider]  citalopram (CELEXA) 20 MG tablet Take 20 mg by mouth at bedtime.   Yes [provider]  clopidogrel (PLAVIX) 75 MG tablet Take 75 mg by mouth at bedtime.   Yes [provider]  docusate sodium (COLACE) 100 MG capsule Take 1 capsule (100 mg total) by mouth every 12 (twelve) hours. 12/25/20  Yes [name], MD  finasteride (PROSCAR) 5 MG tablet Take 5 mg by mouth daily.   Yes [provider]  fosfomycin (MONUROL) 3 g PACK Take 3 g by mouth once for 1 dose. 01/22/21 01/22/21 Yes Tifanny Dollens, Carola Rhine, MD  furosemide (LASIX) 20 MG tablet Take 20 mg by mouth daily.  Yes [provider]  hydrALAZINE (APRESOLINE) 50 MG tablet Take 50 mg by mouth 3 (three) times daily.   Yes [provider]  isosorbide mononitrate (IMDUR) 60 MG 24 hr tablet Take 60 mg by mouth daily.   Yes [provider]  lisinopril (ZESTRIL) 40 MG tablet Take 40 mg by mouth daily.   Yes [provider]  nitroGLYCERIN (NITROSTAT) 0.4 MG SL tablet Place 0.4 mg under the tongue every 5 (five) minutes as needed for chest pain.   Yes [provider]  rosuvastatin (CRESTOR) 10 MG tablet Take 10 mg by mouth at bedtime.   Yes [provider]  tamsulosin (FLOMAX) 0.4 MG CAPS capsule Take 0.4 mg by mouth daily.   Yes [provider]  vitamin B-12  (CYANOCOBALAMIN) 1000 MCG tablet Take 1 tablet (1,000 mcg total) by mouth daily. 01/08/21  Yes Dede Query T, PA-C  Vitamin D, Ergocalciferol, (DRISDOL) 1.25 MG (50000 UNIT) CAPS capsule Take 50,000 Units by mouth every Monday.   Yes [provider]  cephALEXin (KEFLEX) 250 MG/5ML suspension Take 10 mLs (500 mg total) by mouth 3 (three) times daily. Patient not taking: No sig reported Q000111Q   Delora Fuel, MD    Allergies    Aspirin, Northern quahog clam (m. mercenaria) skin test, and Penicillins  Review of Systems   Review of Systems  Constitutional:  Negative for chills and fever.  Eyes:  Negative for photophobia and visual disturbance.  Respiratory:  Negative for cough and shortness of breath.   Cardiovascular:  Negative for chest pain and palpitations.  Gastrointestinal:  Positive for abdominal pain. Negative for vomiting.  Genitourinary:  Positive for dysuria. Negative for hematuria.  Musculoskeletal:  Negative for arthralgias and myalgias.  Skin:  Negative for color change and rash.  Neurological:  Negative for syncope and headaches.  All other systems reviewed and are negative.  Physical Exam Updated Vital Signs BP (!) 143/78 (BP Location: Right Arm)   Pulse 70   Temp 98.3 F (36.8 C) (Oral)   Resp 17   SpO2 100%   Physical Exam Constitutional:      General: He is not in acute distress.    Comments: Thin, chronically ill appearing  HENT:     Head: Normocephalic and atraumatic.  Eyes:     Conjunctiva/sclera: Conjunctivae normal.     Pupils: Pupils are equal, round, and reactive to light.  Cardiovascular:     Rate and Rhythm: Normal rate and regular rhythm.  Pulmonary:     Effort: Pulmonary effort is normal. No respiratory distress.  Abdominal:     General: There is no distension.     Comments: Mild LLQ tenderness, suprapubic tenderness  Genitourinary:    Comments: Indwelling foley catheter with pink tinged urine Skin:    General: Skin is warm and dry.   Neurological:     General: No focal deficit present.     Mental Status: He is alert and oriented to person, place, and time. Mental status is at baseline.    ED Results / Procedures / Treatments   Labs (all labs ordered are listed, but only abnormal results are displayed) Labs Reviewed  COMPREHENSIVE METABOLIC PANEL - Abnormal; Notable for the following components:      Result Value   Potassium 3.3 (*)    Glucose, Bld 106 (*)    Albumin 3.1 (*)    AST 13 (*)    Alkaline Phosphatase 136 (*)    All other components within normal  limits  CBC WITH DIFFERENTIAL/PLATELET - Abnormal; Notable for the following components:   Hemoglobin 11.3 (*)    HCT 37.0 (*)    MCH 25.4 (*)    RDW 17.4 (*)    All other components within normal limits  URINALYSIS, ROUTINE W REFLEX MICROSCOPIC - Abnormal; Notable for the following components:   Color, Urine AMBER (*)    APPearance CLOUDY (*)    Hgb urine dipstick LARGE (*)    Nitrite POSITIVE (*)    Leukocytes,Ua MODERATE (*)    RBC / HPF >50 (*)    WBC, UA >50 (*)    Bacteria, UA MANY (*)    All other components within normal limits  URINE CULTURE  PROTIME-INR  TYPE AND SCREEN  ABO/RH    EKG None  Radiology No results found.  Procedures Procedures   Medications Ordered in ED Medications  sodium chloride 0.9 % bolus 1,000 mL (0 mLs Intravenous Stopped 01/21/21 1700)  sodium chloride 0.9 % bolus 1,000 mL (0 mLs Intravenous Stopped 01/21/21 1437)  cefTRIAXone (ROCEPHIN) 1 g in sodium chloride 0.9 % 100 mL IVPB (0 g Intravenous Stopped 01/21/21 1510)  potassium chloride 10 mEq in 100 mL IVPB (0 mEq Intravenous Stopped 01/21/21 1700)    ED Course  I have reviewed the triage vital signs and the nursing notes.  Pertinent labs & imaging results that were available during my care of the patient were reviewed by me and considered in my medical decision making (see chart for details).  Patient with a history of chronic indwelling Foley  presenting to the ED with concern for painless hematuria, as well as hypotension.  The patient is a poor historian due to baseline dementia.  On exam he does appear to have pink-tinged urine, no obvious clots or frankly bloody urine.  He is on Plavix per his medication review.  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  It is not clear if he improved.  It is also not clear when his Foley was last exchanged.  Initially the patient was hypotensive here.  He has been given some IV fluids, and will reassess his blood pressure.  His white blood cell count is normal.  His hemoglobin is also normal near baseline (11.3), unlikely acute anemia.  K 3.3, Cr at baseline at 1.02.  UA with +leuks, nitrites.   We'll exchange the foley, give him a round of IV rocephin here, and reassess his BP.  Given that he is a poor historian and has continued complaint of abdominal pain, I think a CT scan with contrast be reasonable to evaluate for colitis, diverticulitis, or other cause of his pain.  He was noted to have gallstones on a prior CT scan in 12/25/20 as a renal study, but his LFT's are normal here and he does not have RUQ tenderness, or nausea/vomiting at this time.  Supplemental history provided by his granddaughter by phone. Prior records reviewed including recent urine culture, ED visit  3:30 pm - patient signed out to Dr Zenia Resides EDP pending CT scan abdomen.  If no acute findings, I anticipate discharge home.    Fosfomycin prescribed x 1 dose after discussion with ED pharmacist regarding his prior urine culture and PCN allergy (unclear what this is).  I believe this dose, in addition to his rocephin here and foley exchange should be sufficient to treat UTI.  I don't see evidence of  urosepsis today.   Clinical Course as of 01/21/21 1737  Wed Jan 21, 2021  1436 BP improved to normal after fluids. [MT]  [id number] I spoken again with his  daughter.  If the CT scan is unremarkable, anticipate discharge home on antibiotics, and hoping that we will have better success in treating UTI now that we have exchanged the foley [MT]    Clinical Course User Index [MT] Geary Rufo, Carola Rhine, MD    Final Clinical Impression(s) / ED Diagnoses Final diagnoses:  Hematuria, unspecified type  Cystitis    Rx / DC Orders ED Discharge Orders          Ordered    fosfomycin (MONUROL) 3 g PACK   Once        01/21/21 1513             Wyvonnia Dusky, MD 01/21/21 1738

## 2021-01-21 NOTE — ED Triage Notes (Signed)
Pt reports hematuria from chronic foley catheter. Bright red blood noted in foley bag, abd tenderness noted. Hypotensive in triage

## 2021-01-21 NOTE — ED Provider Notes (Signed)
Emergency Medicine Provider Triage Evaluation Note  Todd Dunn , a 80 y.o. male  was evaluated in triage.  Pt complains of hematuria from his Foley catheter.  Noticed it this morning.  Patient reports some abdominal pain.  Patient poor historian and no family member at bedside.  Review of Systems  Positive: Hematuria, abdominal pain Negative: Back pain  Physical Exam  BP (!) 91/47 (BP Location: Left Arm)   Pulse (!) 57   Temp 98.3 F (36.8 C) (Oral)   Resp 16   SpO2 100%  Gen:   Awake, no distress   Resp:  Normal effort  MSK:   Moves extremities without difficulty  Other:  Foley in place with hematuria  Medical Decision Making  Medically screening exam initiated at 12:56 PM.  Appropriate orders placed.  Todd Dunn was informed that the remainder of the evaluation will be completed by another provider, this initial triage assessment does not replace that evaluation, and the importance of remaining in the ED until their evaluation is complete.  Patient hypotensive here to 80 systolic and roomed immediately.   Delia Heady, PA-C 01/21/21 1259    Blanchie Dessert, MD 01/21/21 1400

## 2021-01-21 NOTE — ED Notes (Signed)
ED Provider at bedside. 

## 2021-01-21 NOTE — ED Provider Notes (Signed)
Patient signed to me by Dr. Langston Masker pending results of CAT scan which showed evidence of cystitis.  There was a stool ball noted but patient states he able to pass stools at this time.  Also changes noted to his right superficial femoral vein but patient has no symptoms there.  Will place on antibiotics and discharge home   Lacretia Leigh, MD 01/21/21 [phone]

## 2021-01-24 LAB — URINE CULTURE: Culture: >=100000 — AB

## 2021-01-25 ENCOUNTER — Telehealth: Payer: Self-pay | Admitting: Emergency Medicine

## 2021-01-25 NOTE — Telephone Encounter (Signed)
Post ED Visit - Positive Culture Follow-up  Culture report reviewed by antimicrobial stewardship pharmacist: [place] Team '[]'$  Elenor Quinones, Pharm.D. '[]'$  Heide Guile, Pharm.D., BCPS AQ-ID '[]'$  Parks Neptune, Pharm.D., BCPS '[]'$  Alycia Rossetti, Pharm.D., BCPS '[]'$  [place], [place].D., BCPS, AAHIVP '[]'$  Legrand Como, Pharm.D., BCPS, AAHIVP '[]'$  Salome Arnt, PharmD, BCPS '[]'$  Johnnette Gourd, PharmD, BCPS '[]'$  Hughes Better, PharmD, BCPS '[]'$  Leeroy Cha, PharmD '[]'$  Laqueta Linden, PharmD, BCPS '[x]'$  Albertina Parr, PharmD  [place] Team '[]'$  Leodis Sias, PharmD '[]'$  Lindell Spar, PharmD '[]'$  Royetta Asal, PharmD '[]'$  Graylin Shiver, Rph '[]'$  Rema Fendt) Glennon Mac, PharmD '[]'$  Arlyn Dunning, PharmD '[]'$  Netta Cedars, PharmD '[]'$  Dia Sitter, PharmD '[]'$  Leone Haven, PharmD '[]'$  Gretta Arab, PharmD '[]'$  Theodis Shove, PharmD '[]'$  Peggyann Juba, PharmD '[]'$  Reuel Boom, PharmD   Positive urine culture Treated with Fosfomycin, organism sensitive to the same and no further patient follow-up is required at this time.  Todd Dunn 01/25/2021, 3:15 PM

## 2021-02-02 ENCOUNTER — Emergency Department (HOSPITAL_COMMUNITY)
Admission: EM | Admit: 2021-02-02 | Discharge: 2021-02-02 | Disposition: A | Discharge status: Home / Self Care | Payer: Medicare Other | Attending: Emergency Medicine | Admitting: Emergency Medicine

## 2021-02-02 ENCOUNTER — Emergency Department (HOSPITAL_COMMUNITY): Payer: Medicare Other

## 2021-02-02 ENCOUNTER — Encounter (HOSPITAL_COMMUNITY): Payer: Self-pay

## 2021-02-02 ENCOUNTER — Other Ambulatory Visit: Payer: Self-pay

## 2021-02-02 ENCOUNTER — Telehealth: Payer: Self-pay | Admitting: Physician Assistant

## 2021-02-02 DIAGNOSIS — W228XXA Striking against or struck by other objects, initial encounter: Secondary | ICD-10-CM | POA: Insufficient documentation

## 2021-02-02 DIAGNOSIS — I2510 Atherosclerotic heart disease of native coronary artery without angina pectoris: Secondary | ICD-10-CM | POA: Diagnosis not present

## 2021-02-02 DIAGNOSIS — Z950 Presence of cardiac pacemaker: Secondary | ICD-10-CM | POA: Diagnosis not present

## 2021-02-02 DIAGNOSIS — I119 Hypertensive heart disease without heart failure: Secondary | ICD-10-CM | POA: Insufficient documentation

## 2021-02-02 DIAGNOSIS — F17210 Nicotine dependence, cigarettes, uncomplicated: Secondary | ICD-10-CM | POA: Insufficient documentation

## 2021-02-02 DIAGNOSIS — Z7902 Long term (current) use of antithrombotics/antiplatelets: Secondary | ICD-10-CM | POA: Insufficient documentation

## 2021-02-02 DIAGNOSIS — Z79899 Other long term (current) drug therapy: Secondary | ICD-10-CM | POA: Diagnosis not present

## 2021-02-02 DIAGNOSIS — S299XXA Unspecified injury of thorax, initial encounter: Secondary | ICD-10-CM | POA: Diagnosis present

## 2021-02-02 LAB — CBC
HCT: 35.7 % — ABNORMAL LOW (ref 39.0–52.0)
Hemoglobin: 10.7 g/dL — ABNORMAL LOW (ref 13.0–17.0)
MCH: 24.9 pg — ABNORMAL LOW (ref 26.0–34.0)
MCHC: 30 g/dL (ref 30.0–36.0)
MCV: 83 fL (ref 80.0–100.0)
Platelets: 215 K/uL (ref 150–400)
RBC: 4.3 MIL/uL (ref 4.22–5.81)
RDW: 17.2 % — ABNORMAL HIGH (ref 11.5–15.5)
WBC: 3.9 K/uL — ABNORMAL LOW (ref 4.0–10.5)
nRBC: 0 % (ref 0.0–0.2)

## 2021-02-02 LAB — BASIC METABOLIC PANEL WITH GFR
Anion gap: 4 — ABNORMAL LOW (ref 5–15)
BUN: 15 mg/dL (ref 8–23)
CO2: 31 mmol/L (ref 22–32)
Calcium: 9.8 mg/dL (ref 8.9–10.3)
Chloride: 103 mmol/L (ref 98–111)
Creatinine, Ser: 0.87 mg/dL (ref 0.61–1.24)
GFR, Estimated: >60 mL/min
Glucose, Bld: 168 mg/dL — ABNORMAL HIGH (ref 70–99)
Potassium: 3.8 mmol/L (ref 3.5–5.1)
Sodium: 138 mmol/L (ref 135–145)

## 2021-02-02 LAB — TROPONIN I (HIGH SENSITIVITY)
Troponin I (High Sensitivity): 11 ng/L
Troponin I (High Sensitivity): 9 ng/L (ref <18)

## 2021-02-02 MED ORDER — ACETAMINOPHEN 325 MG PO TABS
650.0000 mg | ORAL_TABLET | Freq: Once | ORAL | Status: AC
  Administered 2021-02-02: 650 mg via ORAL
  Filled 2021-02-02: qty 2

## 2021-02-02 NOTE — Discharge Instructions (Addendum)
If you develop recurrent, continued, or worsening chest pain, shortness of breath, fever, vomiting, abdominal or back pain, or any other new/concerning symptoms then return to the ER for evaluation.  

## 2021-02-02 NOTE — Telephone Encounter (Signed)
Scheduled per los. Called and left msg. Mailed printout  °

## 2021-02-02 NOTE — ED Triage Notes (Signed)
Patient complains of chest pain, also complains of pain from foley. PA and this RN are having difficulty understanding patients complaints. Alert

## 2021-02-02 NOTE — ED Provider Notes (Signed)
Emergency Medicine Provider Triage Evaluation Note  Todd Dunn , a 80 y.o. male  was evaluated in triage.  Pt complains of chest pain.  Review of Systems  Positive: L chest pain Negative: unsure  Physical Exam  There were no vitals taken for this visit. Gen:   Awake, no distress   Resp:  Normal effort  MSK:   Moves extremities without difficulty  Other:    Medical Decision Making  Medically screening exam initiated at 11:41 AM.  Appropriate orders placed.  Todd Dunn was informed that the remainder of the evaluation will be completed by another provider, this initial triage assessment does not replace that evaluation, and the importance of remaining in the ED until their evaluation is complete.  Unable to obtain much hx from patient as he is having dysphasia likely 2/2 to prior CVA.  Unsure if pt had chest injury or chest pain.  Does hav reproducible Chest wall tenderness on palpation without signs of trauma.  Has pacemaker. Level V caveats   Todd Moras, PA-C 02/02/21 1143    Todd Pick, MD 02/04/21 [phone]

## 2021-02-02 NOTE — ED Provider Notes (Signed)
[facility] EMERGENCY DEPARTMENT Provider Note   CSN: [id number] Arrival date & time: 02/02/21  1113     History No chief complaint on file.   Todd Dunn is a 80 y.o. male.  HPI 79 year old male presents with chest wall injury.  He states that a family member was picking him up out of the wheelchair and turned to the side and accidentally hit his chest on the wall.  Has been having chest pain ever since at his inferior sternum.  Does not seem like having trouble breathing.  No other injuries.  Past Medical History:  Diagnosis Date   BPH (benign prostatic hyperplasia)    CAD (coronary artery disease)    Chronic indwelling Foley catheter    CVA (cerebral vascular accident) ([place])    HTN (hypertension)    Neurogenic bladder    SSS (sick sinus syndrome) ([place])    Vitamin D deficiency     Patient Active Problem List   Diagnosis Date Noted   Normocytic anemia 01/06/2021   Catheter-associated urinary tract infection () A999333   Acute metabolic encephalopathy A999333   Chronic indwelling Foley catheter 12/15/2020   HTN (hypertension) 12/15/2020   History of ESBL E. coli infection 12/15/2020    Past Surgical History:  Procedure Laterality Date   APPENDECTOMY     CARDIAC SURGERY     PACEMAKER PLACEMENT         Family History  Family history unknown: Yes    Social History   Tobacco Use   Smoking status: Some Days    Types: Cigarettes   Smokeless tobacco: Never  Substance Use Topics   Alcohol use: Not Currently   Drug use: Never    Home Medications Prior to Admission medications   Medication Sig Start Date End Date Taking? Authorizing Provider  acetaminophen (TYLENOL) 325 MG tablet Take 650 mg by mouth every 4 (four) hours as needed for mild pain.    [provider]  amLODipine (NORVASC) 10 MG tablet Take 10 mg by mouth daily.    [provider]  cephALEXin (KEFLEX) 250 MG/5ML suspension Take 10 mLs (500 mg  total) by mouth 3 (three) times daily. Patient not taking: No sig reported Q000111Q   Delora Fuel, MD  citalopram (CELEXA) 20 MG tablet Take 20 mg by mouth at bedtime.    [provider]  clopidogrel (PLAVIX) 75 MG tablet Take 75 mg by mouth at bedtime.    [provider]  docusate sodium (COLACE) 100 MG capsule Take 1 capsule (100 mg total) by mouth every 12 (twelve) hours. 12/25/20   [name], MD  finasteride (PROSCAR) 5 MG tablet Take 5 mg by mouth daily.    [provider]  furosemide (LASIX) 20 MG tablet Take 20 mg by mouth daily.    [provider]  hydrALAZINE (APRESOLINE) 50 MG tablet Take 50 mg by mouth 3 (three) times daily.    [provider]  isosorbide mononitrate (IMDUR) 60 MG 24 hr tablet Take 60 mg by mouth daily.    [provider]  lisinopril (ZESTRIL) 40 MG tablet Take 40 mg by mouth daily.    [provider]  nitroGLYCERIN (NITROSTAT) 0.4 MG SL tablet Place 0.4 mg under the tongue every 5 (five) minutes as needed for chest pain.    [provider]  rosuvastatin (CRESTOR) 10 MG tablet Take 10 mg by mouth at bedtime.    [provider]  tamsulosin (FLOMAX) 0.4  MG CAPS capsule Take 0.4 mg by mouth daily.    [provider]  vitamin B-12 (CYANOCOBALAMIN) 1000 MCG tablet Take 1 tablet (1,000 mcg total) by mouth daily. 01/08/21   Lincoln Brigham, PA-C  Vitamin D, Ergocalciferol, (DRISDOL) 1.25 MG (50000 UNIT) CAPS capsule Take 50,000 Units by mouth every Monday.    [provider]    Allergies    Aspirin, Northern quahog clam (m. mercenaria) skin test, and Penicillins  Review of Systems   Review of Systems  Respiratory:  Negative for shortness of breath.   Cardiovascular:  Positive for chest pain.   Physical Exam Updated Vital Signs BP (!) 146/87   Pulse 80   Temp (!) 97.4 F (36.3 C) (Oral)   Resp 17   SpO2 94%   Physical Exam Vitals and nursing note reviewed.   Constitutional:      Appearance: He is well-developed.  HENT:     Head: Normocephalic and atraumatic.     Right Ear: External ear normal.     Left Ear: External ear normal.     Nose: Nose normal.  Eyes:     General:        Right eye: No discharge.        Left eye: No discharge.  Cardiovascular:     Rate and Rhythm: Normal rate and regular rhythm.     Heart sounds: Normal heart sounds.  Pulmonary:     Effort: Pulmonary effort is normal.     Breath sounds: Normal breath sounds.  Chest:     Chest wall: Tenderness present.    Abdominal:     General: There is no distension.     Palpations: Abdomen is soft.     Tenderness: There is no abdominal tenderness.  Musculoskeletal:     Cervical back: Neck supple.  Skin:    General: Skin is warm and dry.  Neurological:     Mental Status: He is alert.     Comments: Patient is difficult to understand due to trouble speaking for prior stroke.  Psychiatric:        Mood and Affect: Mood is not anxious.    ED Results / Procedures / Treatments   Labs (all labs ordered are listed, but only abnormal results are displayed) Labs Reviewed  BASIC METABOLIC PANEL - Abnormal; Notable for the following components:      Result Value   Glucose, Bld 168 (*)    Anion gap 4 (*)    All other components within normal limits  CBC - Abnormal; Notable for the following components:   WBC 3.9 (*)    Hemoglobin 10.7 (*)    HCT 35.7 (*)    MCH 24.9 (*)    RDW 17.2 (*)    All other components within normal limits  TROPONIN I (HIGH SENSITIVITY)  TROPONIN I (HIGH SENSITIVITY)    EKG EKG Interpretation  Date/Time:  Monday February 02 2021 11:47:15 EDT Ventricular Rate:  83 PR Interval:  128 QRS Duration: 104 QT Interval:  390 QTC Calculation: 458 R Axis:   -12 Text Interpretation: Sinus rhythm with Premature supraventricular complexes and with occasional Premature ventricular complexes Anterior infarct , age undetermined ST & T wave abnormality,  consider inferolateral ischemia  ST/T changes similar to Nov 11 2020 Confirmed by Sherwood Gambler [phone]) on 02/02/2021 4:58:55 PM  Radiology DG Chest 2 View  Result Date: 02/02/2021 CLINICAL DATA:  Chest pain. EXAM: CHEST - 2 VIEW COMPARISON:  12/15/2020 FINDINGS: The lungs  are clear without focal pneumonia, edema, pneumothorax or pleural effusion. Streaky atelectasis noted at the lung bases. The cardiopericardial silhouette is within normal limits for size. Left permanent pacemaker again noted. The visualized bony structures of the thorax show no acute abnormality. IMPRESSION: Basilar atelectasis without edema or focal airspace consolidation. Electronically Signed   By: Misty Stanley M.D.   On: 02/02/2021 12:26    Procedures Procedures   Medications Ordered in ED Medications  acetaminophen (TYLENOL) tablet 650 mg (650 mg Oral Given 02/02/21 1821)    ED Course  I have reviewed the triage vital signs and the nursing notes.  Pertinent labs & imaging results that were available during my care of the patient were reviewed by me and considered in my medical decision making (see chart for details).    MDM Rules/Calculators/A&P                           Patient appears to have mild chest wall injury after being accidentally hit into a wall while being carried.  ECG, x-ray and labs are benign.  No evidence of pneumothorax or rib fracture.  No evidence of cardiac injury or heart attack.  Labs are otherwise stable.  He will be given Tylenol and appears stable for discharge home. Final Clinical Impression(s) / ED Diagnoses Final diagnoses:  Injury of chest wall, initial encounter    Rx / DC Orders ED Discharge Orders     None        Sherwood Gambler, MD 02/02/21 1845

## 2021-02-02 NOTE — ED Notes (Signed)
This RN spoke with contact, [name], states she will be here in 30 minutes to pick pt up.

## 2021-02-08 ENCOUNTER — Other Ambulatory Visit: Payer: Self-pay

## 2021-02-08 ENCOUNTER — Encounter (HOSPITAL_COMMUNITY): Payer: Self-pay | Admitting: *Deleted

## 2021-02-08 ENCOUNTER — Emergency Department (HOSPITAL_COMMUNITY)
Admission: EM | Admit: 2021-02-08 | Discharge: 2021-02-09 | Disposition: A | Discharge status: Home / Self Care | Payer: Medicare Other | Attending: Emergency Medicine | Admitting: Emergency Medicine

## 2021-02-08 ENCOUNTER — Emergency Department (HOSPITAL_COMMUNITY): Payer: Medicare Other

## 2021-02-08 DIAGNOSIS — F17210 Nicotine dependence, cigarettes, uncomplicated: Secondary | ICD-10-CM | POA: Insufficient documentation

## 2021-02-08 DIAGNOSIS — R072 Precordial pain: Secondary | ICD-10-CM | POA: Insufficient documentation

## 2021-02-08 DIAGNOSIS — I10 Essential (primary) hypertension: Secondary | ICD-10-CM | POA: Diagnosis not present

## 2021-02-08 DIAGNOSIS — Z950 Presence of cardiac pacemaker: Secondary | ICD-10-CM | POA: Insufficient documentation

## 2021-02-08 DIAGNOSIS — I2510 Atherosclerotic heart disease of native coronary artery without angina pectoris: Secondary | ICD-10-CM | POA: Insufficient documentation

## 2021-02-08 DIAGNOSIS — Z79899 Other long term (current) drug therapy: Secondary | ICD-10-CM | POA: Insufficient documentation

## 2021-02-08 LAB — CBC
HCT: 33.4 % — ABNORMAL LOW (ref 39.0–52.0)
Hemoglobin: 10 g/dL — ABNORMAL LOW (ref 13.0–17.0)
MCH: 24.9 pg — ABNORMAL LOW (ref 26.0–34.0)
MCHC: 29.9 g/dL — ABNORMAL LOW (ref 30.0–36.0)
MCV: 83.1 fL (ref 80.0–100.0)
Platelets: 162 10*3/uL (ref 150–400)
RBC: 4.02 MIL/uL — ABNORMAL LOW (ref 4.22–5.81)
RDW: 17.2 % — ABNORMAL HIGH (ref 11.5–15.5)
WBC: 3.3 10*3/uL — ABNORMAL LOW (ref 4.0–10.5)
nRBC: 0 % (ref 0.0–0.2)

## 2021-02-08 LAB — BASIC METABOLIC PANEL
Anion gap: 4 — ABNORMAL LOW (ref 5–15)
BUN: 11 mg/dL (ref 8–23)
CO2: 27 mmol/L (ref 22–32)
Calcium: 9.4 mg/dL (ref 8.9–10.3)
Chloride: 107 mmol/L (ref 98–111)
Creatinine, Ser: 0.73 mg/dL (ref 0.61–1.24)
GFR, Estimated: >60 mL/min (ref >60)
Glucose, Bld: 92 mg/dL (ref 70–99)
Potassium: 3.9 mmol/L (ref 3.5–5.1)
Sodium: 138 mmol/L (ref 135–145)

## 2021-02-08 LAB — TROPONIN I (HIGH SENSITIVITY): Troponin I (High Sensitivity): 9 ng/L (ref <18)

## 2021-02-08 NOTE — ED Provider Notes (Signed)
[facility] EMERGENCY DEPARTMENT Provider Note   CSN: [id number] Arrival date & time: 02/08/21  2127     History Chief Complaint  Patient presents with   Chest Pain    Todd Dunn is a 80 y.o. male with PMHx HTN, CAD, CVA who presents for evaluation of chest pain.  Patient reports an approximately 1 hour history of substernal chest pain which radiates to his left arm.  He states that the pain is sharp in character and intermittent over the past several days.  He states that he developed chest pain several days ago after he hit his chest.  He denies any associated symptoms including fever, cough, shortness of breath, numbness, weakness, lower extremity edema, pleurisy, or syncope.  He called EMS and was transported to our emergency department for further evaluation.  He states that he was not given aspirin due to allergy; however, he was given nitro without any improvement.     Past Medical History:  Diagnosis Date   BPH (benign prostatic hyperplasia)    CAD (coronary artery disease)    Chronic indwelling Foley catheter    CVA (cerebral vascular accident) ([place])    HTN (hypertension)    Neurogenic bladder    SSS (sick sinus syndrome) ([place])    Vitamin D deficiency     Patient Active Problem List   Diagnosis Date Noted   Normocytic anemia 01/06/2021   Catheter-associated urinary tract infection ([place]) A999333   Acute metabolic encephalopathy A999333   Chronic indwelling Foley catheter 12/15/2020   HTN (hypertension) 12/15/2020   History of ESBL E. coli infection 12/15/2020    Past Surgical History:  Procedure Laterality Date   APPENDECTOMY     CARDIAC SURGERY     PACEMAKER PLACEMENT         Family History  Family history unknown: Yes    Social History   Tobacco Use   Smoking status: Some Days    Types: Cigarettes   Smokeless tobacco: Never  Substance Use Topics   Alcohol use: Not Currently   Drug use: Never    Home  Medications Prior to Admission medications   Medication Sig Start Date End Date Taking? Authorizing Provider  acetaminophen (TYLENOL) 325 MG tablet Take 650 mg by mouth every 4 (four) hours as needed for mild pain.    [provider]  amLODipine (NORVASC) 10 MG tablet Take 10 mg by mouth daily.    [provider]  cephALEXin (KEFLEX) 250 MG/5ML suspension Take 10 mLs (500 mg total) by mouth 3 (three) times daily. Patient not taking: No sig reported Q000111Q   Delora Fuel, MD  citalopram (CELEXA) 20 MG tablet Take 20 mg by mouth at bedtime.    [provider]  clopidogrel (PLAVIX) 75 MG tablet Take 75 mg by mouth at bedtime.    [provider]  docusate sodium (COLACE) 100 MG capsule Take 1 capsule (100 mg total) by mouth every 12 (twelve) hours. 12/25/20   [name], MD  finasteride (PROSCAR) 5 MG tablet Take 5 mg by mouth daily.    [provider]  furosemide (LASIX) 20 MG tablet Take 20 mg by mouth daily.    [provider]  hydrALAZINE (APRESOLINE) 50 MG tablet Take 50 mg by mouth 3 (three) times daily.    [provider]  isosorbide mononitrate (IMDUR) 60 MG 24 hr tablet Take 60 mg by mouth daily.    [provider]  lisinopril (ZESTRIL) 40 MG tablet  Take 40 mg by mouth daily.    [provider]  nitroGLYCERIN (NITROSTAT) 0.4 MG SL tablet Place 0.4 mg under the tongue every 5 (five) minutes as needed for chest pain.    [provider]  rosuvastatin (CRESTOR) 10 MG tablet Take 10 mg by mouth at bedtime.    [provider]  tamsulosin (FLOMAX) 0.4 MG CAPS capsule Take 0.4 mg by mouth daily.    [provider]  vitamin B-12 (CYANOCOBALAMIN) 1000 MCG tablet Take 1 tablet (1,000 mcg total) by mouth daily. 01/08/21   Lincoln Brigham, PA-C  Vitamin D, Ergocalciferol, (DRISDOL) 1.25 MG (50000 UNIT) CAPS capsule Take 50,000 Units by mouth every Monday.    [provider]     Allergies    Aspirin, Northern quahog clam (m. mercenaria) skin test, and Penicillins  Review of Systems   Review of Systems  Constitutional:  Negative for chills and fever.  HENT:  Negative for ear pain and sore throat.   Eyes:  Negative for pain and visual disturbance.  Respiratory:  Negative for cough and shortness of breath.   Cardiovascular:  Positive for chest pain. Negative for palpitations.  Gastrointestinal:  Negative for abdominal pain and vomiting.  Genitourinary:  Negative for dysuria and hematuria.  Musculoskeletal:  Negative for arthralgias and back pain.  Skin:  Negative for color change and rash.  Neurological:  Negative for seizures and syncope.  All other systems reviewed and are negative.  Physical Exam Updated Vital Signs BP (!) 145/76   Pulse 67   Temp 97.7 F (36.5 C) (Oral)   Resp 15   Ht '6\' 1"'$  (1.854 m)   Wt 65.8 kg   SpO2 100%   BMI 19.13 kg/m   Physical Exam Vitals and nursing note reviewed.  Constitutional:      Appearance: He is well-developed.  HENT:     Head: Normocephalic and atraumatic.  Eyes:     Conjunctiva/sclera: Conjunctivae normal.  Cardiovascular:     Rate and Rhythm: Normal rate and regular rhythm.     Heart sounds: No murmur heard. Pulmonary:     Effort: Pulmonary effort is normal. No respiratory distress.     Breath sounds: Normal breath sounds.  Chest:     Chest wall: Tenderness present. No crepitus.  Abdominal:     Palpations: Abdomen is soft.     Tenderness: There is no abdominal tenderness.  Musculoskeletal:     Cervical back: Neck supple.  Skin:    General: Skin is warm and dry.  Neurological:     Mental Status: He is alert.    ED Results / Procedures / Treatments   Labs (all labs ordered are listed, but only abnormal results are displayed) Labs Reviewed  BASIC METABOLIC PANEL - Abnormal; Notable for the following components:      Result Value   Anion gap 4 (*)    All other components within normal  limits  CBC - Abnormal; Notable for the following components:   WBC 3.3 (*)    RBC 4.02 (*)    Hemoglobin 10.0 (*)    HCT 33.4 (*)    MCH 24.9 (*)    MCHC 29.9 (*)    RDW 17.2 (*)    All other components within normal limits  TROPONIN I (HIGH SENSITIVITY)  TROPONIN I (HIGH SENSITIVITY)  TROPONIN I (HIGH SENSITIVITY)  TROPONIN I (HIGH SENSITIVITY)   EKG EKG Interpretation  Date/Time:  Sunday February 08 2021 21:35:26 EDT Ventricular Rate:  65 PR Interval:  148 QRS Duration: 124 QT Interval:  394 QTC Calculation: 410 R Axis:   39 Text Interpretation: Sinus rhythm Multiple premature complexes, vent & supraven LVH with secondary repolarization abnormality Anterior ST elevation, probably due to LVH no significant change since Feb 02 2021 Confirmed by Sherwood Gambler [phone]) on 02/08/2021 9:37:27 PM Also confirmed by Sherwood Gambler [phone]), editor Stetler, Angela [phone]  on 02/09/2021 1:25:47 PM  Radiology DG Chest 2 View  Result Date: 02/08/2021 CLINICAL DATA:  Chest pain EXAM: CHEST - 2 VIEW COMPARISON:  February 02, 2021 FINDINGS: Left chest AICD/pacemaker with leads overlying the right atrium and right ventricle. Median sternotomy wires. Atherosclerotic calcifications of the aortic arch. The heart size and mediastinal contours are unchanged. No focal airspace consolidation. No pleural effusion no pneumothorax. Thoracic spondylosis. Degenerative changes bilateral shoulders. IMPRESSION: No active cardiopulmonary disease. Electronically Signed   By: Dahlia Bailiff M.D.   On: 02/08/2021 23:36    Procedures Procedures   Medications Ordered in ED Medications - No data to display  ED Course  I have reviewed the triage vital signs and the nursing notes.  Pertinent labs & imaging results that were available during my care of the patient were reviewed by me and considered in my medical decision making (see chart for details).    MDM Rules/Calculators/A&P                           80  y.o. male with past medical history as above who presents for evaluation of chest pain.  Afebrile and hemodynamically stable here.  Exam as detailed above.  The patient's risk factors for ACS were reviewed as well as the EKG. HEAR score ineligible due to history of CAD.  I have low suspicion for ACS given atypical presentation.  As reviewed above, EKG without evidence of ischemia or arrhythmia.  Initial troponin is normal.  Serial troponin is pending.  The CXR assists in ruling out pneumonia, Pneumothorax, and Esophageal Tears.  No focal lung findings suggestive of pneumonia or pneumothorax. The patient does not appear to have a Pulmonary Embolism based no apparent asymmetric upper extremity or lower extremity edema/swelling suggestive of DVT, pleurisy, or shortness of breath.  Patient denies any fevers, positional change in chest pain with leaning forward or lying down and has no suggestive EKG changes, making both pericarditis and myocarditis less likely. There does not appear to be an Aortic Dissection either, based on physical exam, historically pain not abrupt in onset, tearing or ripping, pulses symmetric, and absence of acute focal neurologic deficit.  Musculoskeletal strain and costochondritis are also of consideration given that pain is reproducible with palpation.   Remainder of work-up is still pending at the time my signout.  Serial troponin in process.  If negative, patient is suitable for discharge home.  Final Clinical Impression(s) / ED Diagnoses Final diagnoses:  Precordial pain    Rx / DC Orders ED Discharge Orders     None        Violet Baldy, MD 02/09/21 [place], MD 02/10/21 [phone]

## 2021-02-08 NOTE — ED Triage Notes (Signed)
PT here via [facility] from home for acute onset chest pain starting at 0830 pm today.  Pain was central and radiates to L arm.  Given 2 nitro with some relief.  Vs: HR 98 Rr 14 BP 131/78 CBG 89  Not given ASA d/t pt is allergic

## 2021-02-09 LAB — TROPONIN I (HIGH SENSITIVITY)
Troponin I (High Sensitivity): 13 ng/L (ref <18)
Troponin I (High Sensitivity): 13 ng/L (ref <18)

## 2021-02-09 NOTE — ED Notes (Signed)
Message left  At  [phone]

## 2021-02-14 ENCOUNTER — Inpatient Hospital Stay (HOSPITAL_COMMUNITY)
Admission: EM | Admit: 2021-02-14 | Discharge: 2021-02-17 | DRG: 699 | Disposition: A | Discharge status: Home Health | Payer: Medicare Other | Attending: Internal Medicine | Admitting: Internal Medicine

## 2021-02-14 ENCOUNTER — Other Ambulatory Visit: Payer: Self-pay

## 2021-02-14 ENCOUNTER — Encounter (HOSPITAL_COMMUNITY): Payer: Self-pay

## 2021-02-14 DIAGNOSIS — T83518A Infection and inflammatory reaction due to other urinary catheter, initial encounter: Principal | ICD-10-CM | POA: Diagnosis present

## 2021-02-14 DIAGNOSIS — I2510 Atherosclerotic heart disease of native coronary artery without angina pectoris: Secondary | ICD-10-CM | POA: Diagnosis present

## 2021-02-14 DIAGNOSIS — N3001 Acute cystitis with hematuria: Secondary | ICD-10-CM

## 2021-02-14 DIAGNOSIS — L97519 Non-pressure chronic ulcer of other part of right foot with unspecified severity: Secondary | ICD-10-CM | POA: Diagnosis present

## 2021-02-14 DIAGNOSIS — N390 Urinary tract infection, site not specified: Secondary | ICD-10-CM | POA: Diagnosis present

## 2021-02-14 DIAGNOSIS — I69354 Hemiplegia and hemiparesis following cerebral infarction affecting left non-dominant side: Secondary | ICD-10-CM

## 2021-02-14 DIAGNOSIS — Y846 Urinary catheterization as the cause of abnormal reaction of the patient, or of later complication, without mention of misadventure at the time of the procedure: Secondary | ICD-10-CM | POA: Diagnosis present

## 2021-02-14 DIAGNOSIS — R319 Hematuria, unspecified: Secondary | ICD-10-CM | POA: Diagnosis present

## 2021-02-14 DIAGNOSIS — Z880 Allergy status to penicillin: Secondary | ICD-10-CM

## 2021-02-14 DIAGNOSIS — N400 Enlarged prostate without lower urinary tract symptoms: Secondary | ICD-10-CM | POA: Diagnosis present

## 2021-02-14 DIAGNOSIS — I959 Hypotension, unspecified: Secondary | ICD-10-CM | POA: Diagnosis present

## 2021-02-14 DIAGNOSIS — Z79899 Other long term (current) drug therapy: Secondary | ICD-10-CM

## 2021-02-14 DIAGNOSIS — F17210 Nicotine dependence, cigarettes, uncomplicated: Secondary | ICD-10-CM | POA: Diagnosis present

## 2021-02-14 DIAGNOSIS — E559 Vitamin D deficiency, unspecified: Secondary | ICD-10-CM | POA: Diagnosis present

## 2021-02-14 DIAGNOSIS — I10 Essential (primary) hypertension: Secondary | ICD-10-CM | POA: Diagnosis present

## 2021-02-14 DIAGNOSIS — Z7902 Long term (current) use of antithrombotics/antiplatelets: Secondary | ICD-10-CM

## 2021-02-14 DIAGNOSIS — N319 Neuromuscular dysfunction of bladder, unspecified: Secondary | ICD-10-CM | POA: Diagnosis present

## 2021-02-14 DIAGNOSIS — T83511A Infection and inflammatory reaction due to indwelling urethral catheter, initial encounter: Secondary | ICD-10-CM | POA: Insufficient documentation

## 2021-02-14 DIAGNOSIS — A419 Sepsis, unspecified organism: Secondary | ICD-10-CM | POA: Diagnosis present

## 2021-02-14 DIAGNOSIS — D649 Anemia, unspecified: Secondary | ICD-10-CM | POA: Diagnosis present

## 2021-02-14 DIAGNOSIS — Z20822 Contact with and (suspected) exposure to covid-19: Secondary | ICD-10-CM | POA: Diagnosis present

## 2021-02-14 DIAGNOSIS — Z87440 Personal history of urinary (tract) infections: Secondary | ICD-10-CM

## 2021-02-14 DIAGNOSIS — Z978 Presence of other specified devices: Secondary | ICD-10-CM

## 2021-02-14 DIAGNOSIS — Z950 Presence of cardiac pacemaker: Secondary | ICD-10-CM

## 2021-02-14 DIAGNOSIS — Z8619 Personal history of other infectious and parasitic diseases: Secondary | ICD-10-CM | POA: Diagnosis present

## 2021-02-14 LAB — LACTIC ACID, PLASMA
Lactic Acid, Venous: 2 mmol/L (ref 0.5–1.9)
Lactic Acid, Venous: 1.4 mmol/L (ref 0.5–1.9)

## 2021-02-14 LAB — COMPREHENSIVE METABOLIC PANEL
ALT: 11 U/L (ref 0–44)
AST: 13 U/L — ABNORMAL LOW (ref 15–41)
Albumin: 3.3 g/dL — ABNORMAL LOW (ref 3.5–5.0)
Alkaline Phosphatase: 149 U/L — ABNORMAL HIGH (ref 38–126)
Anion gap: 5 (ref 5–15)
BUN: 17 mg/dL (ref 8–23)
CO2: 29 mmol/L (ref 22–32)
Calcium: 10 mg/dL (ref 8.9–10.3)
Chloride: 106 mmol/L (ref 98–111)
Creatinine, Ser: 0.94 mg/dL (ref 0.61–1.24)
GFR, Estimated: >60 mL/min (ref >60)
Glucose, Bld: 103 mg/dL — ABNORMAL HIGH (ref 70–99)
Potassium: 3.8 mmol/L (ref 3.5–5.1)
Sodium: 140 mmol/L (ref 135–145)
Total Bilirubin: 0.5 mg/dL (ref 0.3–1.2)
Total Protein: 6.4 g/dL — ABNORMAL LOW (ref 6.5–8.1)

## 2021-02-14 LAB — URINALYSIS, ROUTINE W REFLEX MICROSCOPIC
Bilirubin Urine: NEGATIVE
Glucose, UA: NEGATIVE mg/dL
Ketones, ur: NEGATIVE mg/dL
Nitrite: POSITIVE — AB
Protein, ur: 30 mg/dL — AB
Specific Gravity, Urine: 1.02 (ref 1.005–1.030)
pH: 6 (ref 5.0–8.0)

## 2021-02-14 LAB — CBC WITH DIFFERENTIAL/PLATELET
Abs Immature Granulocytes: 0.02 10*3/uL (ref 0.00–0.07)
Basophils Absolute: 0 10*3/uL (ref 0.0–0.1)
Basophils Relative: 1 %
Eosinophils Absolute: 0 10*3/uL (ref 0.0–0.5)
Eosinophils Relative: 1 %
HCT: 37.4 % — ABNORMAL LOW (ref 39.0–52.0)
Hemoglobin: 11.3 g/dL — ABNORMAL LOW (ref 13.0–17.0)
Immature Granulocytes: 1 %
Lymphocytes Relative: 45 %
Lymphs Abs: 1.8 10*3/uL (ref 0.7–4.0)
MCH: 24.9 pg — ABNORMAL LOW (ref 26.0–34.0)
MCHC: 30.2 g/dL (ref 30.0–36.0)
MCV: 82.4 fL (ref 80.0–100.0)
Monocytes Absolute: 0.3 10*3/uL (ref 0.1–1.0)
Monocytes Relative: 8 %
Neutro Abs: 1.8 10*3/uL (ref 1.7–7.7)
Neutrophils Relative %: 44 %
Platelets: 179 10*3/uL (ref 150–400)
RBC: 4.54 MIL/uL (ref 4.22–5.81)
RDW: 17.4 % — ABNORMAL HIGH (ref 11.5–15.5)
WBC: 3.9 10*3/uL — ABNORMAL LOW (ref 4.0–10.5)
nRBC: 0 % (ref 0.0–0.2)

## 2021-02-14 LAB — URINALYSIS, MICROSCOPIC (REFLEX)
RBC / HPF: >50 RBC/hpf (ref 0–5)
WBC, UA: >50 WBC/hpf (ref 0–5)

## 2021-02-14 LAB — CBC
HCT: 32.6 % — ABNORMAL LOW (ref 39.0–52.0)
Hemoglobin: 9.8 g/dL — ABNORMAL LOW (ref 13.0–17.0)
MCH: 25.1 pg — ABNORMAL LOW (ref 26.0–34.0)
MCHC: 30.1 g/dL (ref 30.0–36.0)
MCV: 83.4 fL (ref 80.0–100.0)
Platelets: 156 K/uL (ref 150–400)
RBC: 3.91 MIL/uL — ABNORMAL LOW (ref 4.22–5.81)
RDW: 17.5 % — ABNORMAL HIGH (ref 11.5–15.5)
WBC: 3.6 K/uL — ABNORMAL LOW (ref 4.0–10.5)
nRBC: 0 % (ref 0.0–0.2)

## 2021-02-14 LAB — RESP PANEL BY RT-PCR (FLU A&B, COVID) ARPGX2
Influenza A by PCR: NEGATIVE
Influenza B by PCR: NEGATIVE
SARS Coronavirus 2 by RT PCR: NEGATIVE

## 2021-02-14 LAB — PROTIME-INR
INR: 1.1 (ref 0.8–1.2)
Prothrombin Time: 14 seconds (ref 11.4–15.2)

## 2021-02-14 LAB — LIPASE, BLOOD: Lipase: 27 U/L (ref 11–51)

## 2021-02-14 MED ORDER — CITALOPRAM HYDROBROMIDE 20 MG PO TABS
20.0000 mg | ORAL_TABLET | Freq: Every day | ORAL | Status: DC
  Administered 2021-02-14 – 2021-02-16 (×3): 20 mg via ORAL
  Filled 2021-02-14: qty 2
  Filled 2021-02-14 (×2): qty 1

## 2021-02-14 MED ORDER — SODIUM CHLORIDE 0.9 % IV SOLN
1.0000 g | Freq: Once | INTRAVENOUS | Status: AC
  Administered 2021-02-14: 1 g via INTRAVENOUS
  Filled 2021-02-14: qty 10

## 2021-02-14 MED ORDER — ROSUVASTATIN CALCIUM 5 MG PO TABS
10.0000 mg | ORAL_TABLET | Freq: Every day | ORAL | Status: DC
  Administered 2021-02-14 – 2021-02-16 (×3): 10 mg via ORAL
  Filled 2021-02-14 (×3): qty 2

## 2021-02-14 MED ORDER — SODIUM CHLORIDE 0.9 % IV BOLUS
500.0000 mL | Freq: Once | INTRAVENOUS | Status: AC
  Administered 2021-02-14: 500 mL via INTRAVENOUS

## 2021-02-14 MED ORDER — VITAMIN B-12 1000 MCG PO TABS
1000.0000 ug | ORAL_TABLET | Freq: Every day | ORAL | Status: DC
  Administered 2021-02-15 – 2021-02-17 (×3): 1000 ug via ORAL
  Filled 2021-02-14 (×3): qty 1

## 2021-02-14 MED ORDER — ACETAMINOPHEN 325 MG PO TABS: 650.0000 mg | ORAL_TABLET | Freq: Four times a day (QID) | ORAL | Status: DC | PRN

## 2021-02-14 MED ORDER — ACETAMINOPHEN 650 MG RE SUPP: 650.0000 mg | Freq: Four times a day (QID) | RECTAL | Status: DC | PRN

## 2021-02-14 MED ORDER — DOCUSATE SODIUM 100 MG PO CAPS
100.0000 mg | ORAL_CAPSULE | Freq: Two times a day (BID) | ORAL | Status: DC
  Administered 2021-02-14 – 2021-02-17 (×6): 100 mg via ORAL
  Filled 2021-02-14 (×6): qty 1

## 2021-02-14 MED ORDER — SODIUM CHLORIDE 0.9 % IV SOLN
1.0000 g | Freq: Three times a day (TID) | INTRAVENOUS | Status: DC
  Administered 2021-02-14 – 2021-02-17 (×8): 1 g via INTRAVENOUS
  Filled 2021-02-14 (×9): qty 1

## 2021-02-14 MED ORDER — TAMSULOSIN HCL 0.4 MG PO CAPS
0.4000 mg | ORAL_CAPSULE | Freq: Every day | ORAL | Status: DC
  Administered 2021-02-15 – 2021-02-17 (×3): 0.4 mg via ORAL
  Filled 2021-02-14 (×3): qty 1

## 2021-02-14 MED ORDER — FINASTERIDE 5 MG PO TABS
5.0000 mg | ORAL_TABLET | Freq: Every day | ORAL | Status: DC
  Administered 2021-02-14 – 2021-02-17 (×4): 5 mg via ORAL
  Filled 2021-02-14 (×4): qty 1

## 2021-02-14 MED ORDER — LACTATED RINGERS IV SOLN: INTRAVENOUS | Status: AC

## 2021-02-14 NOTE — ED Triage Notes (Signed)
Patient arrives with complaints of blood in urine x1 day. Patient has foley catheter with amber urine and blood catheter site (penis). Patient has some discomfort with urination, no other pain reported.

## 2021-02-14 NOTE — Progress Notes (Signed)
Pharmacy Antibiotic Note  Todd Dunn is a 80 y.o. male admitted on 02/14/2021 with hematuria.  Pharmacy has been consulted for meropenem dosing.  Patient with a history of HTN, CAD, CVA. Presenting with blood in urine x 1 day. Patient with foley catheter in place at time of arrival. History of ESBL in urine most recently in June '22.  Patient consistently growing proteus mirablis in urine. Pseudomonas in urine in august '22.  SCr 0.94; WBC 3.9; afeb  Plan: Meropenem 1g q8h F/u cultures Trend WBC, fever, urinary symptoms, foley status De-escalate when able   Height: '6\' 1"'$  (185.4 cm) Weight: 65.8 kg (145 lb 1 oz) IBW/kg (Calculated) : 79.9  Temp (24hrs), Avg:97.8 F (36.6 C), Min:97.5 F (36.4 C), Max:98 F (36.7 C)  Recent Labs  Lab 02/08/21 2149 02/14/21 1631 02/14/21 1708 02/14/21 1908  WBC 3.3* 3.9*  --   --   CREATININE 0.73 0.94  --   --   LATICACIDVEN  --   --  2.0* 1.4    Estimated Creatinine Clearance: 57.4 mL/min (by C-G formula based on SCr of 0.94 mg/dL).    Allergies  Allergen Reactions   Aspirin Itching   Northern Quahog Clam (M. Mercenaria) Skin Test Itching   Penicillins    Antimicrobials this admission: Meropenem 9/10 >>   Microbiology results: Pending  Thank you for allowing pharmacy to be a part of this patient's care.  Heloise Purpura 02/14/2021 8:39 PM

## 2021-02-14 NOTE — ED Notes (Signed)
Pt given sandwich and ice water.  

## 2021-02-14 NOTE — H&P (Signed)
History and Physical    Todd Dunn [id number] DOB: 1941/05/11 DOA: 02/14/2021  PCP: Fredrich Romans, PA  Patient coming from: Home.  Chief Complaint: Hematuria.  HPI: Todd Dunn is a 80 y.o. 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  Denies any nausea vomiting abdominal pain fever or chills.  ED Course: The ER on exam patient has gross hematuria Foley catheter was replaced as per the ER physician.  Patient's labs show creatinine of 1.9 lactic acid of 2 WBC of 3.9 hemoglobin 11.3 UA showed few bacteria more than 50 RBCs and WBCs nitrites was positive.  Patient was started on empiric antibiotics after urine cultures obtained admit for further work-up hematuria with history of recurrent UTI ESBL.  Patient's lactic acid was elevated and blood pressure was in the low normal.  Blood pressure improved with fluids.  Review of Systems: As per HPI, rest all negative.   Past Medical History:  Diagnosis Date   BPH (benign prostatic hyperplasia)    CAD (coronary artery disease)    Chronic indwelling Foley catheter    CVA (cerebral vascular accident) ([place])    HTN (hypertension)    Neurogenic bladder    SSS (sick sinus syndrome) ([place])    Vitamin D deficiency     Past Surgical History:  Procedure Laterality Date   [place]       reports that he has been smoking cigarettes. He has never used smokeless tobacco. He reports that he does not currently use alcohol. He reports that he does not use drugs.  Allergies  Allergen Reactions   Aspirin Itching   [place] Clam (M. Mercenaria) Skin Test Itching   Penicillins     Family History  Family history unknown: Yes    Prior to Admission medications   Medication Sig Start Date End Date  Taking? Authorizing Provider  acetaminophen (TYLENOL) 325 MG tablet Take 650 mg by mouth every 4 (four) hours as needed for mild pain.    [provider]  amLODipine (NORVASC) 10 MG tablet Take 10 mg by mouth daily.    [provider]  cephALEXin (KEFLEX) 250 MG/5ML suspension Take 10 mLs (500 mg total) by mouth 3 (three) times daily. Patient not taking: No sig reported [id number]   Delora Fuel, MD  citalopram (CELEXA) 20 MG tablet Take 20 mg by mouth at bedtime.    [provider]  clopidogrel (PLAVIX) 75 MG tablet Take 75 mg by mouth at bedtime.    [provider]  docusate sodium (COLACE) 100 MG capsule Take 1 capsule (100 mg total) by mouth every 12 (twelve) hours. 12/25/20   [name], MD  finasteride (PROSCAR) 5 MG tablet Take 5 mg by mouth daily.    [provider]  furosemide (LASIX) 20 MG tablet Take 20 mg by mouth daily.    [provider]  hydrALAZINE (APRESOLINE) 50 MG tablet Take 50 mg by mouth 3 (three) times daily.    [provider]  isosorbide mononitrate (IMDUR) 60 MG 24 hr tablet Take 60 mg by mouth daily.    [provider]  lisinopril (ZESTRIL) 40 MG tablet Take 40 mg by mouth daily.    [provider]  nitroGLYCERIN (NITROSTAT) 0.4 MG SL tablet Place 0.4  mg under the tongue every 5 (five) minutes as needed for chest pain.    [provider]  rosuvastatin (CRESTOR) 10 MG tablet Take 10 mg by mouth at bedtime.    [provider]  tamsulosin (FLOMAX) 0.4 MG CAPS capsule Take 0.4 mg by mouth daily.    [provider]  vitamin B-12 (CYANOCOBALAMIN) 1000 MCG tablet Take 1 tablet (1,000 mcg total) by mouth daily. 01/08/21   Lincoln Brigham, PA-C  Vitamin D, Ergocalciferol, (DRISDOL) 1.25 MG (50000 UNIT) CAPS capsule Take 50,000 Units by mouth every Monday.    [provider]    Physical Exam: Constitutional: Moderately built and nourished. Vitals:   02/14/21 1657  02/14/21 1800 02/14/21 1917 02/14/21 1932  BP: 91/65 129/78 140/87 128/69  Pulse: 68 66 76 (!) 57  Resp: '18 16  16  '$ Temp: 98 F (36.7 C)     TempSrc:      SpO2: 100% 98% 100% 100%  Weight:      Height:       Eyes: Anicteric no pallor. ENMT: No discharge from the ears eyes nose and mouth: Neck: No mass felt.  No neck rigidity. Respiratory: No rhonchi or crepitations. Cardiovascular: S1-S2 heard. Abdomen: Soft nontender bowel sound present. Musculoskeletal: No edema.  Skin: No rash. Neurologic: Alert awake oriented to time place and person. Psychiatric: Appears normal.  Normal affect.   Labs on Admission: I have personally reviewed following labs and imaging studies  CBC: Recent Labs  Lab 02/08/21 2149 02/14/21 1631  WBC 3.3* 3.9*  NEUTROABS  --  1.8  HGB 10.0* 11.3*  HCT 33.4* 37.4*  MCV 83.1 82.4  PLT 162 0000000   Basic Metabolic Panel: Recent Labs  Lab 02/08/21 2149 02/14/21 1631  NA 138 140  K 3.9 3.8  CL 107 106  CO2 27 29  GLUCOSE 92 103*  BUN 11 17  CREATININE 0.73 0.94  CALCIUM 9.4 10.0   GFR: Estimated Creatinine Clearance: 57.4 mL/min (by C-G formula based on SCr of 0.94 mg/dL). Liver Function Tests: Recent Labs  Lab 02/14/21 1631  AST 13*  ALT 11  ALKPHOS 149*  BILITOT 0.5  PROT 6.4*  ALBUMIN 3.3*   Recent Labs  Lab 02/14/21 1631  LIPASE 27   No results for input(s): AMMONIA in the last 168 hours. Coagulation Profile: Recent Labs  Lab 02/14/21 1631  INR 1.1   Cardiac Enzymes: No results for input(s): CKTOTAL, CKMB, CKMBINDEX, TROPONINI in the last 168 hours. BNP (last 3 results) No results for input(s): PROBNP in the last 8760 hours. HbA1C: No results for input(s): HGBA1C in the last 72 hours. CBG: No results for input(s): GLUCAP in the last 168 hours. Lipid Profile: No results for input(s): CHOL, HDL, LDLCALC, TRIG, CHOLHDL, LDLDIRECT in the last 72 hours. Thyroid Function Tests: No results for input(s): TSH, T4TOTAL,  FREET4, T3FREE, THYROIDAB in the last 72 hours. Anemia Panel: No results for input(s): VITAMINB12, FOLATE, FERRITIN, TIBC, IRON, RETICCTPCT in the last 72 hours. Urine analysis:    Component Value Date/Time   COLORURINE BROWN (A) 02/14/2021 1232   APPEARANCEUR HAZY (A) 02/14/2021 1232   LABSPEC 1.020 02/14/2021 1232   PHURINE 6.0 02/14/2021 1232   GLUCOSEU NEGATIVE 02/14/2021 1232   HGBUR LARGE (A) 02/14/2021 1232   BILIRUBINUR NEGATIVE 02/14/2021 1232   KETONESUR NEGATIVE 02/14/2021 1232   PROTEINUR 30 (A) 02/14/2021 1232   NITRITE POSITIVE (A) 02/14/2021 1232   LEUKOCYTESUR MODERATE (A) 02/14/2021 1232   Sepsis Labs: @  LABRCNTIP(procalcitonin:4,lacticidven:4) ) Recent Results (from the past 240 hour(s))  Resp Panel by RT-PCR (Flu A&B, Covid) Nasopharyngeal Swab     Status: None   Collection Time: 02/14/21  5:00 PM   Specimen: Nasopharyngeal Swab; Nasopharyngeal(NP) swabs in vial transport medium  Result Value Ref Range Status   SARS Coronavirus 2 by RT PCR NEGATIVE NEGATIVE Final    Comment: (NOTE) SARS-CoV-2 target nucleic acids are NOT DETECTED.  The SARS-CoV-2 RNA is generally detectable in upper respiratory specimens during the acute phase of infection. The lowest concentration of SARS-CoV-2 viral copies this assay can detect is 138 copies/mL. A negative result does not preclude SARS-Cov-2 infection and should not be used as the sole basis for treatment or other patient management decisions. A negative result may occur with  improper specimen collection/handling, submission of specimen other than nasopharyngeal swab, presence of viral mutation(s) within the areas targeted by this assay, and inadequate number of viral copies(<138 copies/mL). A negative result must be combined with clinical observations, patient history, and epidemiological information. The expected result is Negative.  Fact Sheet for Patients:  EntrepreneurPulse.com.au  Fact Sheet  for Healthcare Providers:  IncredibleEmployment.be  This test is no t yet approved or cleared by the [place] FDA and  has been authorized for detection and/or diagnosis of SARS-CoV-2 by FDA under an Emergency Use Authorization (EUA). This EUA will remain  in effect (meaning this test can be used) for the duration of the COVID-19 declaration under Section 564(b)(1) of the Act, 21 U.S.C.section 360bbb-3(b)(1), unless the authorization is terminated  or revoked sooner.       Influenza A by PCR NEGATIVE NEGATIVE Final   Influenza B by PCR NEGATIVE NEGATIVE Final    Comment: (NOTE) The Xpert Xpress SARS-CoV-2/FLU/RSV plus assay is intended as an aid in the diagnosis of influenza from Nasopharyngeal swab specimens and should not be used as a sole basis for treatment. Nasal washings and aspirates are unacceptable for Xpert Xpress SARS-CoV-2/FLU/RSV testing.  Fact Sheet for Patients: EntrepreneurPulse.com.au  Fact Sheet for Healthcare Providers: IncredibleEmployment.be  This test is not yet approved or cleared by the [place] FDA and has been authorized for detection and/or diagnosis of SARS-CoV-2 by FDA under an Emergency Use Authorization (EUA). This EUA will remain in effect (meaning this test can be used) for the duration of the COVID-19 declaration under Section 564(b)(1) of the Act, 21 U.S.C. section 360bbb-3(b)(1), unless the authorization is terminated or revoked.  Performed at [facility], [place] [address], [name], [place] [address]      Radiological Exams on Admission: No results found.    Assessment/Plan Principal Problem:   Hematuria Active Problems:   Catheter-associated urinary tract infection (HCC)   Chronic indwelling Foley catheter   HTN (hypertension)   History of ESBL E. coli infection   Normocytic anemia    Hematuria with chronic indwelling Foley catheter and recurrent UTI with  history of ESBL presently on meropenem.  Closely follow CBC.  Holding Plavix for now.  If hematuria persist may need urology input.  Per ER physician Foley was replaced in the ER. History of stroke with left-sided weakness on statins.  Holding Plavix due to hematuria. Anemia follow CBC. Chronic ulceration of the right foot.  Get wound team.   DVT prophylaxis: SCDs.  Avoiding anticoagulation since patient has hematuria. Code Status: Full code. Family Communication: Discussed with patient. Disposition Plan: Home. Consults called: None. Admission status: Observation.   Rise Patience MD [facility] Pager [phone]-  CM:[phone].  If 7PM-7AM, please contact night-coverage [email or web address] Password [facility]  02/14/2021, 8:03 PM

## 2021-02-14 NOTE — ED Provider Notes (Signed)
Emergency Department Provider Note   I have reviewed the triage vital signs and the nursing notes.   HISTORY  Chief Complaint Hematuria   HPI Todd Dunn is a 80 y.o. male with past history of BPH with indwelling Foley presents to the emergency department with hematuria.  He is having some discomfort when he does pass urine into the Foley.  Denies abdominal or flank pain.  No known fevers or chills. No radiation of symptoms or modifying factors. Patient notes symptoms worsening over the last 24 hours. In chart review I see prior UTIs with cultures growing various organisms including pseudomonas, proteus, and E. Coli.    Past Medical History:  Diagnosis Date   BPH (benign prostatic hyperplasia)    CAD (coronary artery disease)    Chronic indwelling Foley catheter    CVA (cerebral vascular accident) ([place])    HTN (hypertension)    Neurogenic bladder    SSS (sick sinus syndrome) ([place])    Vitamin D deficiency     Patient Active Problem List   Diagnosis Date Noted   Sepsis ([place]) 02/15/2021   Hematuria 02/14/2021   Acute cystitis with hematuria    Normocytic anemia 01/06/2021   Catheter-associated urinary tract infection ([place]) [id number]   Acute metabolic encephalopathy [id number]   Chronic indwelling Foley catheter 12/15/2020   HTN (hypertension) 12/15/2020   History of ESBL E. coli infection 12/15/2020    Past Surgical History:  Procedure Laterality Date   APPENDECTOMY     CARDIAC SURGERY     PACEMAKER PLACEMENT      Allergies Aspirin, [place] qu[place] clam (m. mercenaria) skin test, and Penicillins  Family History  Family history unknown: Yes    Social History Social History   Tobacco Use   Smoking status: Some Days    Types: Cigarettes   Smokeless tobacco: Never  Substance Use Topics   Alcohol use: Not Currently   Drug use: Never    Review of Systems  Constitutional: No fever/chills Eyes: No visual changes. ENT: No sore  throat. Cardiovascular: Denies chest pain. Respiratory: Denies shortness of breath. Gastrointestinal: No abdominal pain.  No nausea, no vomiting.  No diarrhea.  No constipation.  Genitourinary: Positive hematuria and pain with passing urine.  Musculoskeletal: Negative for back pain. Skin: Negative for rash. Neurological: Negative for headaches, focal weakness or numbness.  10-point ROS otherwise negative.  ____________________________________________   PHYSICAL EXAM:  VITAL SIGNS: ED Triage Vitals  Enc Vitals Group     BP 02/14/21 1200 (!) 85/62     Pulse Rate 02/14/21 1200 67     Resp 02/14/21 1200 18     Temp 02/14/21 1200 (!) 97.5 F (36.4 C)     Temp Source 02/14/21 1200 Oral     SpO2 02/14/21 1200 100 %     Weight 02/14/21 1230 145 lb 1 oz (65.8 kg)     Height 02/14/21 1230 '6\' 1"'$  (1.854 m)   Constitutional: Alert. Well appearing and in no acute distress. Eyes: Conjunctivae are normal.  Head: Atraumatic. Nose: No congestion/rhinnorhea. Mouth/Throat: Mucous membranes are moist.   Neck: No stridor.   Cardiovascular: Normal rate, regular rhythm. Good peripheral circulation. Grossly normal heart sounds.   Respiratory: Normal respiratory effort.  No retractions. Lungs CTAB. Gastrointestinal: Soft and nontender. No distention.  Musculoskeletal: No gross deformities of extremities. Neurologic:  Normal speech and language.  Skin:  Skin is warm, dry and intact. No rash noted.   ____________________________________________   LABS (all labs ordered are  listed, but only abnormal results are displayed)  Labs Reviewed  URINE CULTURE - Abnormal; Notable for the following components:      Result Value   Culture   (*)    Value: 20,000 COLONIES/mL PSEUDOMONAS AERUGINOSA 10,000 COLONIES/mL ENTEROCOCCUS FAECALIS    Organism ID, Bacteria PSEUDOMONAS AERUGINOSA (*)    Organism ID, Bacteria ENTEROCOCCUS FAECALIS (*)    All other components within normal limits  URINALYSIS,  ROUTINE W REFLEX MICROSCOPIC - Abnormal; Notable for the following components:   Color, Urine BROWN (*)    APPearance HAZY (*)    Hgb urine dipstick LARGE (*)    Protein, ur 30 (*)    Nitrite POSITIVE (*)    Leukocytes,Ua MODERATE (*)    All other components within normal limits  URINALYSIS, MICROSCOPIC (REFLEX) - Abnormal; Notable for the following components:   Bacteria, UA FEW (*)    All other components within normal limits  COMPREHENSIVE METABOLIC PANEL - Abnormal; Notable for the following components:   Glucose, Bld 103 (*)    Total Protein 6.4 (*)    Albumin 3.3 (*)    AST 13 (*)    Alkaline Phosphatase 149 (*)    All other components within normal limits  CBC WITH DIFFERENTIAL/PLATELET - Abnormal; Notable for the following components:   WBC 3.9 (*)    Hemoglobin 11.3 (*)    HCT 37.4 (*)    MCH 24.9 (*)    RDW 17.4 (*)    All other components within normal limits  LACTIC ACID, PLASMA - Abnormal; Notable for the following components:   Lactic Acid, Venous 2.0 (*)    All other components within normal limits  BASIC METABOLIC PANEL - Abnormal; Notable for the following components:   Glucose, Bld 117 (*)    Anion gap 3 (*)    All other components within normal limits  CBC - Abnormal; Notable for the following components:   WBC 3.6 (*)    RBC 3.91 (*)    Hemoglobin 9.8 (*)    HCT 32.6 (*)    MCH 25.1 (*)    RDW 17.5 (*)    All other components within normal limits  CBC - Abnormal; Notable for the following components:   WBC 3.1 (*)    RBC 3.93 (*)    Hemoglobin 9.9 (*)    HCT 32.0 (*)    MCH 25.2 (*)    RDW 17.2 (*)    All other components within normal limits  CBC - Abnormal; Notable for the following components:   WBC 3.2 (*)    RBC 3.99 (*)    Hemoglobin 10.0 (*)    HCT 32.4 (*)    MCH 25.1 (*)    RDW 17.2 (*)    Platelets 149 (*)    All other components within normal limits  CBC - Abnormal; Notable for the following components:   WBC 2.9 (*)    RBC  4.17 (*)    Hemoglobin 10.6 (*)    HCT 33.5 (*)    MCH 25.4 (*)    RDW 17.2 (*)    Platelets 146 (*)    All other components within normal limits  CULTURE, BLOOD (ROUTINE X 2)  CULTURE, BLOOD (ROUTINE X 2)  RESP PANEL BY RT-PCR (FLU A&B, COVID) ARPGX2  LIPASE, BLOOD  PROTIME-INR  LACTIC ACID, PLASMA  BASIC METABOLIC PANEL  MAGNESIUM   ____________________________________________  RADIOLOGY  None   ____________________________________________   PROCEDURES  Procedure(s) performed:  Procedures  None  ____________________________________________   INITIAL IMPRESSION / ASSESSMENT AND PLAN / ED COURSE  Pertinent labs & imaging results that were available during my care of the patient were reviewed by me and considered in my medical decision making (see chart for details).   Patient presents emergency department with symptoms concerning for urinary tract infection.  He has hematuria but on UA he has nitrite positive, leukocyte positive, bacteria noted.  We will send this for culture.  Patient does have history of ESBL but most recent infections have been Pseudomonas and Proteus. Will cover with rocephin and exchange foley cath.   Discussed patient's case with [facility] to request admission. Patient and family (if present) updated with plan. Care transferred to [facility] service.  I reviewed all nursing notes, vitals, pertinent old records, EKGs, labs, imaging (as available).  ____________________________________________  FINAL CLINICAL IMPRESSION(S) / ED DIAGNOSES  Final diagnoses:  Acute cystitis with hematuria  Hypotension, unspecified hypotension type     MEDICATIONS GIVEN DURING THIS VISIT:  Medications  lactated ringers infusion ( Intravenous New Bag/Given 02/15/21 1107)  sodium chloride 0.9 % bolus 500 mL (0 mLs Intravenous Stopped 02/14/21 1759)  cefTRIAXone (ROCEPHIN) 1 g in sodium chloride 0.9 % 100 mL IVPB (0 g Intravenous Stopped 02/14/21 1759)  sodium chloride 0.9  % bolus 500 mL (0 mLs Intravenous Stopped 02/14/21 2044)     NEW OUTPATIENT MEDICATIONS STARTED DURING THIS VISIT:  Discharge Medication List as of 02/17/2021 10:21 AM     START taking these medications   Details  ciprofloxacin (CIPRO) 500 MG tablet Take 1 tablet (500 mg total) by mouth 2 (two) times daily for 5 days., Starting Tue 02/17/2021, Until Sun 02/22/2021, Normal        Note:  This document was prepared using Dragon voice recognition software and may include unintentional dictation errors.  Nanda Quinton, MD, [facility]    Cyanna Neace, [facility], MD 02/18/21 [phone]

## 2021-02-14 NOTE — Plan of Care (Signed)

## 2021-02-15 DIAGNOSIS — A419 Sepsis, unspecified organism: Secondary | ICD-10-CM

## 2021-02-15 DIAGNOSIS — E559 Vitamin D deficiency, unspecified: Secondary | ICD-10-CM | POA: Diagnosis present

## 2021-02-15 DIAGNOSIS — I959 Hypotension, unspecified: Secondary | ICD-10-CM | POA: Diagnosis present

## 2021-02-15 DIAGNOSIS — Z20822 Contact with and (suspected) exposure to covid-19: Secondary | ICD-10-CM | POA: Diagnosis present

## 2021-02-15 DIAGNOSIS — D649 Anemia, unspecified: Secondary | ICD-10-CM

## 2021-02-15 DIAGNOSIS — Y846 Urinary catheterization as the cause of abnormal reaction of the patient, or of later complication, without mention of misadventure at the time of the procedure: Secondary | ICD-10-CM | POA: Diagnosis present

## 2021-02-15 DIAGNOSIS — F17210 Nicotine dependence, cigarettes, uncomplicated: Secondary | ICD-10-CM | POA: Diagnosis present

## 2021-02-15 DIAGNOSIS — N390 Urinary tract infection, site not specified: Secondary | ICD-10-CM

## 2021-02-15 DIAGNOSIS — Z978 Presence of other specified devices: Secondary | ICD-10-CM

## 2021-02-15 DIAGNOSIS — Z79899 Other long term (current) drug therapy: Secondary | ICD-10-CM | POA: Diagnosis not present

## 2021-02-15 DIAGNOSIS — N400 Enlarged prostate without lower urinary tract symptoms: Secondary | ICD-10-CM | POA: Diagnosis present

## 2021-02-15 DIAGNOSIS — Z8619 Personal history of other infectious and parasitic diseases: Secondary | ICD-10-CM | POA: Diagnosis not present

## 2021-02-15 DIAGNOSIS — Z87440 Personal history of urinary (tract) infections: Secondary | ICD-10-CM | POA: Diagnosis not present

## 2021-02-15 DIAGNOSIS — L97519 Non-pressure chronic ulcer of other part of right foot with unspecified severity: Secondary | ICD-10-CM | POA: Diagnosis present

## 2021-02-15 DIAGNOSIS — N3001 Acute cystitis with hematuria: Secondary | ICD-10-CM | POA: Diagnosis present

## 2021-02-15 DIAGNOSIS — Z950 Presence of cardiac pacemaker: Secondary | ICD-10-CM | POA: Diagnosis not present

## 2021-02-15 DIAGNOSIS — Z880 Allergy status to penicillin: Secondary | ICD-10-CM | POA: Diagnosis not present

## 2021-02-15 DIAGNOSIS — R319 Hematuria, unspecified: Secondary | ICD-10-CM | POA: Diagnosis not present

## 2021-02-15 DIAGNOSIS — I10 Essential (primary) hypertension: Secondary | ICD-10-CM | POA: Diagnosis present

## 2021-02-15 DIAGNOSIS — I69354 Hemiplegia and hemiparesis following cerebral infarction affecting left non-dominant side: Secondary | ICD-10-CM | POA: Diagnosis not present

## 2021-02-15 DIAGNOSIS — I2510 Atherosclerotic heart disease of native coronary artery without angina pectoris: Secondary | ICD-10-CM | POA: Diagnosis present

## 2021-02-15 DIAGNOSIS — T83518A Infection and inflammatory reaction due to other urinary catheter, initial encounter: Secondary | ICD-10-CM | POA: Diagnosis present

## 2021-02-15 DIAGNOSIS — N319 Neuromuscular dysfunction of bladder, unspecified: Secondary | ICD-10-CM | POA: Diagnosis present

## 2021-02-15 DIAGNOSIS — Z7902 Long term (current) use of antithrombotics/antiplatelets: Secondary | ICD-10-CM | POA: Diagnosis not present

## 2021-02-15 LAB — CBC
HCT: 32 % — ABNORMAL LOW (ref 39.0–52.0)
HCT: 32.4 % — ABNORMAL LOW (ref 39.0–52.0)
Hemoglobin: 9.9 g/dL — ABNORMAL LOW (ref 13.0–17.0)
Hemoglobin: 10 g/dL — ABNORMAL LOW (ref 13.0–17.0)
MCH: 25.2 pg — ABNORMAL LOW (ref 26.0–34.0)
MCH: 25.1 pg — ABNORMAL LOW (ref 26.0–34.0)
MCHC: 30.9 g/dL (ref 30.0–36.0)
MCHC: 30.9 g/dL (ref 30.0–36.0)
MCV: 81.4 fL (ref 80.0–100.0)
MCV: 81.2 fL (ref 80.0–100.0)
Platelets: 156 10*3/uL (ref 150–400)
Platelets: 149 10*3/uL — ABNORMAL LOW (ref 150–400)
RBC: 3.93 MIL/uL — ABNORMAL LOW (ref 4.22–5.81)
RBC: 3.99 MIL/uL — ABNORMAL LOW (ref 4.22–5.81)
RDW: 17.2 % — ABNORMAL HIGH (ref 11.5–15.5)
RDW: 17.2 % — ABNORMAL HIGH (ref 11.5–15.5)
WBC: 3.1 10*3/uL — ABNORMAL LOW (ref 4.0–10.5)
WBC: 3.2 10*3/uL — ABNORMAL LOW (ref 4.0–10.5)
nRBC: 0 % (ref 0.0–0.2)
nRBC: 0 % (ref 0.0–0.2)

## 2021-02-15 LAB — BASIC METABOLIC PANEL
Anion gap: 3 — ABNORMAL LOW (ref 5–15)
BUN: 15 mg/dL (ref 8–23)
CO2: 28 mmol/L (ref 22–32)
Calcium: 9.4 mg/dL (ref 8.9–10.3)
Chloride: 108 mmol/L (ref 98–111)
Creatinine, Ser: 0.74 mg/dL (ref 0.61–1.24)
GFR, Estimated: >60 mL/min (ref >60)
Glucose, Bld: 117 mg/dL — ABNORMAL HIGH (ref 70–99)
Potassium: 3.8 mmol/L (ref 3.5–5.1)
Sodium: 139 mmol/L (ref 135–145)

## 2021-02-15 MED ORDER — CHLORHEXIDINE GLUCONATE CLOTH 2 % EX PADS
6.0000 | MEDICATED_PAD | Freq: Every day | CUTANEOUS | Status: DC
  Administered 2021-02-15 – 2021-02-17 (×3): 6 via TOPICAL

## 2021-02-15 MED ORDER — HYDROCERIN EX CREA
TOPICAL_CREAM | Freq: Every day | CUTANEOUS | Status: DC
  Filled 2021-02-15: qty 113

## 2021-02-15 MED ORDER — HYDRALAZINE HCL 50 MG PO TABS
50.0000 mg | ORAL_TABLET | Freq: Two times a day (BID) | ORAL | Status: DC
  Administered 2021-02-15 – 2021-02-17 (×4): 50 mg via ORAL
  Filled 2021-02-15 (×4): qty 1

## 2021-02-15 MED ORDER — ISOSORBIDE MONONITRATE ER 60 MG PO TB24
60.0000 mg | ORAL_TABLET | Freq: Every day | ORAL | Status: DC
  Administered 2021-02-16 – 2021-02-17 (×2): 60 mg via ORAL
  Filled 2021-02-15 (×2): qty 1

## 2021-02-15 MED ORDER — LISINOPRIL 40 MG PO TABS
40.0000 mg | ORAL_TABLET | Freq: Every day | ORAL | Status: DC
  Administered 2021-02-16 – 2021-02-17 (×2): 40 mg via ORAL
  Filled 2021-02-15 (×2): qty 1

## 2021-02-15 NOTE — Plan of Care (Signed)

## 2021-02-15 NOTE — Consult Note (Signed)
WOC Nurse Consult Note: Reason for Consult: Two chronic areas of tissue injury to RLE. Both dry. The assistance of the Beside RN, M. Smith-Fischer is appreciated. Wound type: trauma vs venous insufficiency Pressure Injury POA: N/A Measurement:Right heel: 1.5cm round x 0.2cm and right lateral LE:  1cm x 1.5cm x 0.2cm Wound bed:Red, dry Drainage (amount, consistency, odor) None Periwound: intact, dry Dressing procedure/placement/frequency: I will provide guidance for daily soap and water cleanse to the bilateral LEs and for moisturizing with Eucerin cream. The wounds will be covered with xeroform gauze and topped with silicone bordered foam. Bilateral Pressure redistribution heel boots are provided and a silicone foam is to be placed to the sacrum fr pressure injury prevention.  [place] nursing team will not follow, but will remain available to this patient, the nursing and medical teams.  Please re-consult if needed. Thanks, Maudie Flakes, MSN, RN, [place], Arther Abbott  Pager# [phone]

## 2021-02-15 NOTE — Progress Notes (Addendum)
PROGRESS NOTE  Todd Dunn [id number] DOB: May 06, 1941 DOA: 02/14/2021 PCP: Fredrich Romans, PA   LOS: 0 days   Brief narrative:  Todd Dunn is a 80 y.o. 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  Denied any nausea vomiting abdominal pain fever or chills.  In the ED, patient had gross hematuria.  Foley catheter was replaced by the ED physician.  Creatinine was elevated at 1.9 with lactic acid of 2.0.  Urinalysis showed significant bacteria RBC and WBC.  Patient was started on empiric antibiotics and was admitted to hospital.  Patient does have history of recurrent UTI ESBL.   Assessment/Plan:  Principal Problem:   Hematuria Active Problems:   Catheter-associated urinary tract infection (HCC)   Chronic indwelling Foley catheter   HTN (hypertension)   History of ESBL E. coli infection   Normocytic anemia   Sepsis (HCC)   Hematuria with chronic indwelling Foley catheter and recurrent UTI with history of ESBL presently on meropenem.  Continue to hold Plavix.  If continues to have hematuria might need urology.  Foley catheter was replaced in the ED.   History of stroke with left-sided weakness Continue statins, hold Plavix due to hematuria   Anemia follow CBC.  Transfuse if necessary.  hemoglobin of 9.9.  We will continue to monitor CBC.  Essential hypertension.  Patient initially had hypotension.  Will resume lisinopril, hydralazine and isosorbide dinitrate..  Chronic ulceration of the right foot.  Wound care team has been consulted.   DVT prophylaxis: SCDs Start: 02/14/21 2001   Code Status: Full code  Family Communication: None  Status is: Observation  Patient will benefit from transition to inpatient  because:Ongoing diagnostic testing needed not appropriate for outpatient  work up, IV treatments appropriate due to intensity of illness or inability to take PO, and Inpatient level of care appropriate due to severity of illness  Dispo: The patient is from: Home              Anticipated d/c is to: Home              Patient currently is not medically stable to d/c.   Difficult to place patient No  Consultants: None  Procedures: None  Anti-infectives:  Merrem IV  Anti-infectives (From admission, onward)    Start     Dose/Rate Route Frequency Ordered Stop   02/14/21 2200  meropenem (MERREM) 1 g in sodium chloride 0.9 % 100 mL IVPB        1 g 200 mL/hr over 30 Minutes Intravenous Every 8 hours 02/14/21 2039     02/14/21 1645  cefTRIAXone (ROCEPHIN) 1 g in sodium chloride 0.9 % 100 mL IVPB        1 g 200 mL/hr over 30 Minutes Intravenous  Once 02/14/21 1631 02/14/21 1759       Subjective: Today, patient was seen and examined at bedside.  Patient states that he burns while peeing.  Difficult to understand his speech.  Objective: Vitals:   02/15/21 0453 02/15/21 0454  BP:  (!) 154/63  Pulse: (!) 34 67  Resp: 18 16  Temp:  98.3 F (36.8 C)  SpO2: 100%     Intake/Output Summary (Last 24 hours) at 02/15/2021 1337 Last data filed at 02/15/2021 1219 Gross per 24 hour  Intake 2515.64 ml  Output 1550 ml  Net 965.64 ml   Filed Weights   02/14/21 1230 02/15/21 0030  Weight: 65.8 kg 62.2 kg   Body mass index is 18.09 kg/m.   Physical Exam: GENERAL: Patient is alert awake and communicative, not in obvious distress.  Thinly built, HENT: No scleral pallor or icterus. Pupils equally reactive to light. Oral mucosa is moist NECK: is supple, no gross swelling noted. CHEST: Clear to auscultation. No crackles or wheezes.  Diminished breath sounds bilaterally. CVS: S1 and S2 heard, no murmur. Regular rate and rhythm.  ABDOMEN: Soft, non-tender, bowel sounds are present.  Foley catheter in place EXTREMITIES: No edema. CNS: Cranial nerves are intact. No  focal motor deficits. SKIN: warm and dry without rashes.  Data Review: I have personally reviewed the following laboratory data and studies,  CBC: Recent Labs  Lab 02/08/21 2149 02/14/21 1631 02/14/21 2207 02/15/21 0138 02/15/21 0759  WBC 3.3* 3.9* 3.6* 3.1* 3.2*  NEUTROABS  --  1.8  --   --   --   HGB 10.0* 11.3* 9.8* 9.9* 10.0*  HCT 33.4* 37.4* 32.6* 32.0* 32.4*  MCV 83.1 82.4 83.4 81.4 81.2  PLT 162 179 156 156 [id number]*   Basic Metabolic Panel: Recent Labs  Lab 02/08/21 2149 02/14/21 1631 02/15/21 0138  NA 138 140 139  K 3.9 3.8 3.8  CL 107 106 108  CO2 '27 29 28  '$ GLUCOSE 92 103* 117*  BUN '11 17 15  '$ CREATININE 0.73 0.94 0.74  CALCIUM 9.4 10.0 9.4   Liver Function Tests: Recent Labs  Lab 02/14/21 1631  AST 13*  ALT 11  ALKPHOS 149*  BILITOT 0.5  PROT 6.4*  ALBUMIN 3.3*   Recent Labs  Lab 02/14/21 1631  LIPASE 27   No results for input(s): AMMONIA in the last 168 hours. Cardiac Enzymes: No results for input(s): CKTOTAL, CKMB, CKMBINDEX, TROPONINI in the last 168 hours. BNP (last 3 results) No results for input(s): BNP in the last 8760 hours.  ProBNP (last 3 results) No results for input(s): PROBNP in the last 8760 hours.  CBG: No results for input(s): GLUCAP in the last 168 hours. Recent Results (from the past 240 hour(s))  Resp Panel by RT-PCR (Flu A&B, Covid) Nasopharyngeal Swab     Status: None   Collection Time: 02/14/21  5:00 PM   Specimen: Nasopharyngeal Swab; Nasopharyngeal(NP) swabs in vial transport medium  Result Value Ref Range Status   SARS Coronavirus 2 by RT PCR NEGATIVE NEGATIVE Final    Comment: (NOTE) SARS-CoV-2 target nucleic acids are NOT DETECTED.  The SARS-CoV-2 RNA is generally detectable in upper respiratory specimens during the acute phase of infection. The lowest concentration of SARS-CoV-2 viral copies this assay can detect is 138 copies/mL. A negative result does not preclude SARS-Cov-2 infection and should not be used  as the sole basis for treatment or other patient management decisions. A negative result may occur with  improper specimen collection/handling, submission of specimen other than nasopharyngeal swab, presence of viral mutation(s) within the areas targeted by this assay, and inadequate number of viral copies(<138 copies/mL). A negative result must be combined with clinical observations, patient history, and epidemiological information. The expected result is Negative.  Fact Sheet for Patients:  EntrepreneurPulse.com.au  Fact Sheet for Healthcare Providers:  IncredibleEmployment.be  This test is no t yet approved or cleared by the [place] FDA and  has been authorized for detection and/or diagnosis of SARS-CoV-2 by FDA under an Emergency Use Authorization (EUA). This EUA will remain  in effect (meaning this test can be used) for the duration of the COVID-19 declaration under Section 564(b)(1) of the Act, 21 U.S.C.section 360bbb-3(b)(1), unless the authorization is terminated  or revoked sooner.       Influenza A by PCR NEGATIVE NEGATIVE Final   Influenza B by PCR NEGATIVE NEGATIVE Final    Comment: (NOTE) The Xpert Xpress SARS-CoV-2/FLU/RSV plus assay is intended as an aid in the diagnosis of influenza from Nasopharyngeal swab specimens and should not be used as a sole basis for treatment. Nasal washings and aspirates are unacceptable for Xpert Xpress SARS-CoV-2/FLU/RSV testing.  Fact Sheet for Patients: EntrepreneurPulse.com.au  Fact Sheet for Healthcare Providers: IncredibleEmployment.be  This test is not yet approved or cleared by the [place] FDA and has been authorized for detection and/or diagnosis of SARS-CoV-2 by FDA under an Emergency Use Authorization (EUA). This EUA will remain in effect (meaning this test can be used) for the duration of the COVID-19 declaration under Section 564(b)(1)  of the Act, 21 U.S.C. section 360bbb-3(b)(1), unless the authorization is terminated or revoked.  Performed at [facility], [place] [address] [place], [place] [address]      Studies: No results found.    Flora Lipps, MD  [facility] 02/15/2021  If 7PM-7AM, please contact night-coverage

## 2021-02-16 LAB — CBC
HCT: 33.5 % — ABNORMAL LOW (ref 39.0–52.0)
Hemoglobin: 10.6 g/dL — ABNORMAL LOW (ref 13.0–17.0)
MCH: 25.4 pg — ABNORMAL LOW (ref 26.0–34.0)
MCHC: 31.6 g/dL (ref 30.0–36.0)
MCV: 80.3 fL (ref 80.0–100.0)
Platelets: 146 10*3/uL — ABNORMAL LOW (ref 150–400)
RBC: 4.17 MIL/uL — ABNORMAL LOW (ref 4.22–5.81)
RDW: 17.2 % — ABNORMAL HIGH (ref 11.5–15.5)
WBC: 2.9 10*3/uL — ABNORMAL LOW (ref 4.0–10.5)
nRBC: 0 % (ref 0.0–0.2)

## 2021-02-16 LAB — BASIC METABOLIC PANEL
Anion gap: 6 (ref 5–15)
BUN: 17 mg/dL (ref 8–23)
CO2: 27 mmol/L (ref 22–32)
Calcium: 9.6 mg/dL (ref 8.9–10.3)
Chloride: 104 mmol/L (ref 98–111)
Creatinine, Ser: 0.66 mg/dL (ref 0.61–1.24)
GFR, Estimated: >60 mL/min (ref >60)
Glucose, Bld: 89 mg/dL (ref 70–99)
Potassium: 4 mmol/L (ref 3.5–5.1)
Sodium: 137 mmol/L (ref 135–145)

## 2021-02-16 LAB — MAGNESIUM: Magnesium: 2.1 mg/dL (ref 1.7–2.4)

## 2021-02-16 NOTE — Evaluation (Signed)
Physical Therapy Evaluation Patient Details Name: Todd Dunn MRN: [id number] DOB: 1941-05-30 Today's Date: 02/16/2021  History of Present Illness     Clinical Impression  Pt admitted with above diagnosis. At baseline, pt lives with family with 24 hr support.  He has L sided weakness and was workign with HHPT.  Pt able to ambulate at home with assist but also has w/c.  Family reports they have necessary DME. Pt also with dysarthria/expressive aphasia - spoke with granddaughter on phone who provided information. Today, pt requiring min guard-min A for transfers and ambulated 4'.  He did tend to drag his L LE which granddaughter reports was improving at home.  Pt somewhat impulsive which is his baseline.  Pt appears to be slightly below his baseline mobility. and will benefit from acute PT services.   Pt currently with functional limitations due to the deficits listed below (see PT Problem List). Pt will benefit from skilled PT to increase their independence and safety with mobility to allow discharge to the venue listed below.          Recommendations for follow up therapy are one component of a multi-disciplinary discharge planning process, led by the attending physician.  Recommendations may be updated based on patient status, additional functional criteria and insurance authorization.  Follow Up Recommendations Home health PT;Supervision/Assistance - 24 hour    Equipment Recommendations  None recommended by PT    Recommendations for Other Services       Precautions / Restrictions        Mobility  Bed Mobility Overal bed mobility: Needs Assistance Bed Mobility: Supine to Sit     Supine to sit: Min guard     General bed mobility comments: min guard for safety    Transfers Overall transfer level: Needs assistance Equipment used: Rolling walker (2 wheeled) Transfers: Sit to/from Stand Sit to Stand: Min guard         General transfer comment: min guard for safety;  cues for hand placement  Ambulation/Gait Ambulation/Gait assistance: Min assist Gait Distance (Feet): 4 Feet Assistive device: Rolling walker (2 wheeled) Gait Pattern/deviations: Step-to pattern;Decreased stride length;Decreased dorsiflexion - left;Decreased stance time - left;Decreased weight shift to left Gait velocity: decreased   General Gait Details: Pt taking a few steps to chair but dragging L leg with foot drop.  Needs cues to get all the way to the chair (starts to reach too soon - states he does this at baseline)  Science writer    Modified Rankin (Stroke Patients Only)       Balance Overall balance assessment: Needs assistance Sitting-balance support: No upper extremity supported Sitting balance-Leahy Scale: Good     Standing balance support: Bilateral upper extremity supported Standing balance-Leahy Scale: Poor Standing balance comment: needs RW and min guard static; min A gait                             Pertinent Vitals/Pain Pain Assessment: No/denies pain    Home Living Family/patient expects to be discharged to:: Private residence Living Arrangements: Other relatives Available Help at Discharge: Family;Available 24 hours/day Type of Home: House Home Access: Stairs to enter   [employer] of Steps: 2 Home Layout: One level Home Equipment: Walker - 2 wheels;Wheelchair - manual;Grab bars - tub/shower Additional Comments: Spoke with granddaughter [name] who provided hx    Prior Function Level of  Independence: Needs assistance   Gait / Transfers Assistance Needed: Pt has a w/c but has been working with HHPT and progressed to walking with RW and min guard. Reports even able to get his L foot out front (used to drag).  Granddaughter states excellent improvement with HHPT  ADL's / Homemaking Assistance Needed: family assists with bathing and dressing        Hand Dominance   Dominant Hand: Right     Extremity/Trunk Assessment   Upper Extremity Assessment Upper Extremity Assessment: LUE deficits/detail LUE Deficits / Details: L sided weakness from prior CVA; demonstrates normal ROM with ~3/5 strength throughout    Lower Extremity Assessment Lower Extremity Assessment: LLE deficits/detail LLE Deficits / Details: L sided weakness from prior CVA; ROM WFL; MMT: ankle 1/5, knee ext 2/5, hip 2/5    Cervical / Trunk Assessment Cervical / Trunk Assessment: Normal  Communication   Communication: Expressive difficulties  Cognition Arousal/Alertness: Awake/alert Behavior During Therapy: WFL for tasks assessed/performed Overall Cognitive Status: Difficult to assess                                 General Comments: Pt following commands without cues; somewhat impulsive (impulsive baseline per granddaughter)      General Comments General comments (skin integrity, edema, etc.): VSS    Exercises     Assessment/Plan    PT Assessment Patient needs continued PT services  PT Problem List Decreased strength;Decreased mobility;Decreased safety awareness;Decreased coordination;Decreased activity tolerance;Decreased cognition;Decreased balance;Decreased knowledge of use of DME       PT Treatment Interventions DME instruction;Therapeutic activities;Gait training;Therapeutic exercise;Patient/family education;Balance training;Functional mobility training    PT Goals (Current goals can be found in the Care Plan section)  Acute Rehab PT Goals Patient Stated Goal: return home with HHPT per family; declines further DME needs PT Goal Formulation: With patient/family Time For Goal Achievement: 03/02/21 Potential to Achieve Goals: Good    Frequency Min 3X/week   Barriers to discharge        Co-evaluation               AM-PAC PT "6 Clicks" Mobility  Outcome Measure Help needed turning from your back to your side while in a flat bed without using bedrails?: A Little Help  needed moving from lying on your back to sitting on the side of a flat bed without using bedrails?: A Little Help needed moving to and from a bed to a chair (including a wheelchair)?: A Little Help needed standing up from a chair using your arms (e.g., wheelchair or bedside chair)?: A Little Help needed to walk in hospital room?: A Little Help needed climbing 3-5 steps with a railing? : A Little 6 Click Score: 18    End of Session Equipment Utilized During Treatment: Gait belt Activity Tolerance: Patient tolerated treatment well Patient left: with chair alarm set;with nursing/sitter in room;with call bell/phone within reach Nurse Communication: Mobility status (RN and tech in room during eval) PT Visit Diagnosis: Unsteadiness on feet (R26.81);Muscle weakness (generalized) (M62.81)    Time: [id number] PT Time Calculation (min) (ACUTE ONLY): 26 min   Charges:   PT Evaluation $PT Eval Low Complexity: 1 Low PT Treatments $Therapeutic Activity: 8-22 mins        Abran Richard, PT Acute Rehab Services Pager [phone] [facility] [phone]   Karlton Lemon 02/16/2021, 11:26 AM

## 2021-02-16 NOTE — Progress Notes (Signed)
PROGRESS NOTE  Todd Dunn [id number] DOB: [birth year] DOA: 02/14/2021 PCP: Fredrich Romans, PA   LOS: 1 day   Brief narrative:  Todd Dunn is a 80 y.o. 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  Denied any nausea vomiting abdominal pain fever or chills.  In the ED, patient had gross hematuria.  Foley catheter was replaced by the ED physician.  Creatinine was elevated at 1.9 with lactic acid of 2.0.  Urinalysis showed significant bacteria RBC and WBC.  Patient was started on empiric antibiotics and was admitted to hospital.  Patient does have history of recurrent UTI ESBL.   Assessment/Plan:  Principal Problem:   Hematuria Active Problems:   Catheter-associated urinary tract infection (HCC)   Chronic indwelling Foley catheter   HTN (hypertension)   History of ESBL E. coli infection   Normocytic anemia   Sepsis (HCC)  Hematuria with chronic indwelling Foley catheter and recurrent UTI with history of ESBL presently on meropenem.  Continue to hold Plavix.    Foley catheter was replaced in the ED. blood cultures negative in 2 days.  Urine culture shows Pseudomonas and Enterococcus.  Insignificant colony so far.  History of stroke with left-sided weakness Continue statins, hold Plavix due to hematuria   Anemia follow CBC.   hemoglobin of 10.6 today we will continue to monitor CBC.  Essential hypertension.  Continue lisinopril, hydralazine and isosorbide dinitrate..  Chronic tissue injury of the right foot.  Wound care team on board  Debility, deconditioning.  We will get PT evaluation. Has walker and wheelchair at home.  DVT prophylaxis: SCDs Start: 02/14/21 2001   Code Status: Full code  Family Communication: None  Status is: Inpatient  Patient is inpatient because:Ongoing  diagnostic testing needed not appropriate for outpatient work up, IV treatments appropriate due to intensity of illness or inability to take PO, and Inpatient level of care appropriate due to severity of illness  Dispo: The patient is from: Home              Anticipated d/c is to: Home              Patient currently is not medically stable to d/c.   Difficult to place patient No  Consultants: None  Procedures: None  Anti-infectives:  Merrem IV  Anti-infectives (From admission, onward)    Start     Dose/Rate Route Frequency Ordered Stop   02/14/21 2200  meropenem (MERREM) 1 g in sodium chloride 0.9 % 100 mL IVPB        1 g 200 mL/hr over 30 Minutes Intravenous Every 8 hours 02/14/21 2039     02/14/21 1645  cefTRIAXone (ROCEPHIN) 1 g in sodium chloride 0.9 % 100 mL IVPB        1 g 200 mL/hr over 30 Minutes Intravenous  Once 02/14/21 1631 02/14/21 1759       Subjective: Today, patient was seen and examined at bedside.  Denies  burning dysuria today.  Denies any nausea vomiting fever or chills.  Objective: Vitals:   02/16/21 0736 02/16/21 1115  BP:  (!) 154/76  Pulse:  64  Resp:  19  Temp: 98.6 F (37 C)   SpO2:  100%    Intake/Output Summary (Last 24 hours) at 02/16/2021 1453 Last data filed at 02/16/2021 1228 Gross per 24 hour  Intake 1620 ml  Output 3300 ml  Net -1680 ml    Filed Weights   02/14/21 1230 02/15/21 0030 02/16/21 0400  Weight: 65.8 kg 62.2 kg 61.7 kg   Body mass index is 17.95 kg/m.   Physical Exam: GENERAL: Patient is alert awake and communicative, not in obvious distress.  Thinly built. HENT: No scleral pallor or icterus. Pupils equally reactive to light. Oral mucosa is moist NECK: is supple, no gross swelling noted. CHEST: Clear to auscultation. No crackles or wheezes.  Diminished breath sounds bilaterally. CVS: S1 and S2 heard, no murmur. Regular rate and rhythm.  ABDOMEN: Soft, non-tender, bowel sounds are present.  Chronic Foley  catheter in place EXTREMITIES: No edema. CNS: Cranial nerves are intact.  Moves extremities. SKIN: warm and dry without rashes.  Data Review: I have personally reviewed the following laboratory data and studies,  CBC: Recent Labs  Lab 02/14/21 1631 02/14/21 2207 02/15/21 0138 02/15/21 0759 02/16/21 0433  WBC 3.9* 3.6* 3.1* 3.2* 2.9*  NEUTROABS 1.8  --   --   --   --   HGB 11.3* 9.8* 9.9* 10.0* 10.6*  HCT 37.4* 32.6* 32.0* 32.4* 33.5*  MCV 82.4 83.4 81.4 81.2 80.3  PLT 179 156 156 149* 146*    Basic Metabolic Panel: Recent Labs  Lab 02/14/21 1631 02/15/21 0138 02/16/21 0433  NA 140 139 137  K 3.8 3.8 4.0  CL 106 108 104  CO2 '29 28 27  '$ GLUCOSE 103* 117* 89  BUN '17 15 17  '$ CREATININE 0.94 0.74 0.66  CALCIUM 10.0 9.4 9.6  MG  --   --  2.1    Liver Function Tests: Recent Labs  Lab 02/14/21 1631  AST 13*  ALT 11  ALKPHOS 149*  BILITOT 0.5  PROT 6.4*  ALBUMIN 3.3*    Recent Labs  Lab 02/14/21 1631  LIPASE 27    No results for input(s): AMMONIA in the last 168 hours. Cardiac Enzymes: No results for input(s): CKTOTAL, CKMB, CKMBINDEX, TROPONINI in the last 168 hours. BNP (last 3 results) No results for input(s): BNP in the last 8760 hours.  ProBNP (last 3 results) No results for input(s): PROBNP in the last 8760 hours.  CBG: No results for input(s): GLUCAP in the last 168 hours. Recent Results (from the past 240 hour(s))  Culture, blood (routine x 2)     Status: None (Preliminary result)   Collection Time: 02/14/21  5:00 PM   Specimen: BLOOD LEFT HAND  Result Value Ref Range Status   Specimen Description BLOOD LEFT HAND  Final   Special Requests   Final    BOTTLES DRAWN AEROBIC AND ANAEROBIC Blood Culture adequate volume   Culture   Final    NO GROWTH 2 DAYS Performed at [facility], [address] [place], [place] [address]    Report Status PENDING  Incomplete  Culture, blood (routine x 2)     Status: None (Preliminary result)    Collection Time: 02/14/21  5:00 PM   Specimen: BLOOD RIGHT FOREARM  Result Value Ref Range Status   Specimen Description BLOOD RIGHT FOREARM  Final   Special Requests   Final    BOTTLES DRAWN AEROBIC AND ANAEROBIC Blood Culture results may not be optimal due to an excessive volume of blood received in culture bottles   Culture   Final    NO GROWTH 2 DAYS Performed at [facility], [place] [address] [place], [place] [address]  Report Status PENDING  Incomplete  Resp Panel by RT-PCR (Flu A&B, Covid) Nasopharyngeal Swab     Status: None   Collection Time: 02/14/21  5:00 PM   Specimen: Nasopharyngeal Swab; Nasopharyngeal(NP) swabs in vial transport medium  Result Value Ref Range Status   SARS Coronavirus 2 by RT PCR NEGATIVE NEGATIVE Final    Comment: (NOTE) SARS-CoV-2 target nucleic acids are NOT DETECTED.  The SARS-CoV-2 RNA is generally detectable in upper respiratory specimens during the acute phase of infection. The lowest concentration of SARS-CoV-2 viral copies this assay can detect is 138 copies/mL. A negative result does not preclude SARS-Cov-2 infection and should not be used as the sole basis for treatment or other patient management decisions. A negative result may occur with  improper specimen collection/handling, submission of specimen other than nasopharyngeal swab, presence of viral mutation(s) within the areas targeted by this assay, and inadequate number of viral copies(<138 copies/mL). A negative result must be combined with clinical observations, patient history, and epidemiological information. The expected result is Negative.  Fact Sheet for Patients:  EntrepreneurPulse.com.au  Fact Sheet for Healthcare Providers:  IncredibleEmployment.be  This test is no t yet approved or cleared by the [place] FDA and  has been authorized for detection and/or diagnosis of SARS-CoV-2 by FDA under an Emergency Use Authorization  (EUA). This EUA will remain  in effect (meaning this test can be used) for the duration of the COVID-19 declaration under Section 564(b)(1) of the Act, 21 U.S.C.section 360bbb-3(b)(1), unless the authorization is terminated  or revoked sooner.       Influenza A by PCR NEGATIVE NEGATIVE Final   Influenza B by PCR NEGATIVE NEGATIVE Final    Comment: (NOTE) The Xpert Xpress SARS-CoV-2/FLU/RSV plus assay is intended as an aid in the diagnosis of influenza from Nasopharyngeal swab specimens and should not be used as a sole basis for treatment. Nasal washings and aspirates are unacceptable for Xpert Xpress SARS-CoV-2/FLU/RSV testing.  Fact Sheet for Patients: EntrepreneurPulse.com.au  Fact Sheet for Healthcare Providers: IncredibleEmployment.be  This test is not yet approved or cleared by the [place] FDA and has been authorized for detection and/or diagnosis of SARS-CoV-2 by FDA under an Emergency Use Authorization (EUA). This EUA will remain in effect (meaning this test can be used) for the duration of the COVID-19 declaration under Section 564(b)(1) of the Act, 21 U.S.C. section 360bbb-3(b)(1), unless the authorization is terminated or revoked.  Performed at [facility], [place] [address], [place], [place] [address]   Urine Culture     Status: Abnormal (Preliminary result)   Collection Time: 02/14/21  7:12 PM   Specimen: Urine, Catheterized  Result Value Ref Range Status   Specimen Description URINE, CATHETERIZED  Final   Special Requests   Final    NONE Performed at [facility], [place] [address] [place], [place] [address]    Culture (A)  Final    20,000 COLONIES/mL PSEUDOMONAS AERUGINOSA 10,000 COLONIES/mL ENTEROCOCCUS FAECALIS    Report Status PENDING  Incomplete      Studies: No results found.    Flora Lipps, MD  [facility] 02/16/2021  If 7PM-7AM, please contact night-coverage

## 2021-02-17 LAB — URINE CULTURE: Culture: 20000 — AB

## 2021-02-17 MED ORDER — CIPROFLOXACIN HCL 500 MG PO TABS: 500.0000 mg | ORAL_TABLET | Freq: Two times a day (BID) | ORAL | 0 refills | Status: AC

## 2021-02-17 NOTE — Plan of Care (Signed)
  Problem: Education: Goal: Knowledge of General Education information will improve Description: Including pain rating scale, medication(s)/side effects and non-pharmacologic comfort measures Outcome: Adequate for Discharge   

## 2021-02-17 NOTE — TOC Transition Note (Signed)
Transition of Care [facility]) - CM/SW Discharge Note   Patient Details  Name: Todd Dunn MRN: [id number] Date of Birth: 01-30-1941  Transition of Care [facility]) CM/SW Contact:  Zenon Mayo, RN Phone Number: 02/17/2021, 9:20 AM   Clinical Narrative:    NCM spoke with patient this am, he lives with his granddaughter, he has rolling walker and a w/chair at home with grab bars in bathroom.  He has chronic foley which his grand daughrter states he goes to the urologist. Per Rozanna Boer daughter she will have her brother to transport patient home today.  NCM offered choice, she states he is active with [facility] and they would like to stay with [facility]. NCM notified Kenzie with [facility] that patient is for dc today.      Final next level of care: [place] Barriers to Discharge: No Barriers Identified   Patient Goals and CMS Choice Patient states their goals for this hospitalization and ongoing recovery are:: return home with granddaughter with [facility] CMS Medicare.gov Compare Post Acute Care list provided to:: Patient Represenative (must comment) Choice offered to / list presented to : Adult Children  Discharge Placement                       Discharge Plan and Services   Discharge Planning Services: CM Consult Post Acute Care Choice: Home Health            DME Agency: NA       HH Arranged: PT [place] Agency: [place] ([facility]) Date [place]: 02/17/21 Time [place]: [phone] Representative spoke with at [place]: [place] ([place]) Interventions     Readmission Risk Interventions Readmission Risk Prevention Plan 02/17/2021  Transportation Screening Complete  Medication Review Press photographer) Complete  PCP or Specialist appointment within 3-5 days of discharge Complete  HRI or [place] Complete  SW Recovery Care/Counseling Consult Complete  [place] Not Applicable

## 2021-02-17 NOTE — TOC Initial Note (Signed)
Transition of Care [facility]) - Initial/Assessment Note    Patient Details  Name: Todd Dunn MRN: [id number] Date of Birth: [birth year]  Transition of Care [facility]) CM/SW Contact:    Zenon Mayo, RN Phone Number: 02/17/2021, 9:16 AM  Clinical Narrative:                 NCM spoke with patient this am, he lives with his granddaughter, he has rolling walker and a w/chair at home with grab bars in bathroom.  He has chronic foley which his grand daughrter states he goes to the urologist. Per Rozanna Boer daughter she will have her brother to transport patient home today.  NCM offered choice, she states he is active with [place] and they would like to stay with [facility]. NCM notified Kenzie with [facility] that patient is for dc today.   Expected Discharge Plan: [place] Barriers to Discharge: No Barriers Identified   Patient Goals and CMS Choice Patient states their goals for this hospitalization and ongoing recovery are:: return home with granddaughter with [facility] CMS Medicare.gov Compare Post Acute Care list provided to:: Patient Represenative (must comment) Choice offered to / list presented to : Adult Children  Expected Discharge Plan and Services Expected Discharge Plan: [place]   Discharge Planning Services: CM Consult Post Acute Care Choice: [place] arrangements for the past 2 months: Single Family Home Expected Discharge Date: 02/17/21                 DME Agency: NA       HH Arranged: PT HH Agency: [place] ([facility]) Date HH Agency Contacted: 02/17/21 Time HH Agency Contacted: [phone] Representative spoke with at [place]: [place] Arrangements/Services Living arrangements for the past 2 months: [place] Lives with:: Adult Children Patient language and need for interpreter reviewed:: Yes Do you feel safe going back to the place where you live?: Yes      Need for Family Participation in Patient Care:  Yes (Comment) Care giver support system in place?: Yes (comment)   Criminal Activity/Legal Involvement Pertinent to Current Situation/Hospitalization: No - Comment as needed  Activities of Daily Living Home Assistive Devices/Equipment: Walker (specify type) ADL Screening (condition at time of admission) Patient's cognitive ability adequate to safely complete daily activities?: Yes Is the patient deaf or have difficulty hearing?: No Does the patient have difficulty seeing, even when wearing glasses/contacts?: No Does the patient have difficulty concentrating, remembering, or making decisions?: Yes Patient able to express need for assistance with ADLs?: Yes Does the patient have difficulty dressing or bathing?: No Independently performs ADLs?: Yes (appropriate for developmental age) Does the patient have difficulty walking or climbing stairs?: Yes Weakness of Legs: Both Weakness of Arms/Hands: None  Permission Sought/Granted                  Emotional Assessment Appearance:: Appears stated age Attitude/Demeanor/Rapport:  (Appropriate) Affect (typically observed): Appropriate Orientation: : Oriented to Self, Oriented to Place, Oriented to  Time Alcohol / Substance Use: Not Applicable Psych Involvement: No (comment)  Admission diagnosis:  Acute cystitis with hematuria [N30.01] Hematuria [R31.9] Hypotension, unspecified hypotension type [I95.9] Sepsis ([place]) [A41.9] Patient Active Problem List   Diagnosis Date Noted   Sepsis ([place]) 02/15/2021   Hematuria 02/14/2021   Acute cystitis with hematuria    Normocytic anemia 01/06/2021   Catheter-associated urinary tract infection ([place]) [id number]   Acute metabolic encephalopathy A999333   Chronic  indwelling Foley catheter 12/15/2020   HTN (hypertension) 12/15/2020   History of ESBL E. coli infection 12/15/2020   PCP:  Fredrich Romans, PA Pharmacy:   [facility] [place], [place] [address] Phone: [phone] Fax: [phone]     Social Determinants of Health (SDOH) Interventions    Readmission Risk Interventions Readmission Risk Prevention Plan 02/17/2021  Transportation Screening Complete  Medication Review ([place]) Complete  PCP or Specialist appointment within 3-5 days of discharge Complete  HRI or [place] Complete  SW Recovery Care/Counseling Consult Complete  [place] Not Applicable

## 2021-02-17 NOTE — Discharge Summary (Addendum)
Physician Discharge Summary  Todd Dunn [id number] DOB: 10-Apr-1941 DOA: 02/14/2021  PCP: Fredrich Romans, PA  Admit date: 02/14/2021 Discharge date: 02/17/2021  Admitted From: Home  Discharge disposition: Home with home PT  Recommendations for Outpatient Follow-Up:   Follow up with your primary care provider in one week.  Check CBC, BMP, magnesium in the next visit  Discharge Diagnosis:   Principal Problem:   Hematuria Active Problems:   Catheter-associated urinary tract infection (HCC)   Chronic indwelling Foley catheter   HTN (hypertension)   History of ESBL E. coli infection   Normocytic anemia  Discharge Condition: Improved.  Diet recommendation: Low sodium, heart healthy.    Wound care: None.  Code status: Full.   History of Present Illness:   Todd Dunn is a 80 y.o. 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  Denied any nausea, vomiting abdominal pain, fever or chills.  In the ED, patient had gross hematuria.  Foley catheter was replaced by the ED physician.  Creatinine was elevated at 1.9 with lactic acid of 2.0.  Urinalysis showed significant bacteria RBC and WBC.  Patient was started on empiric antibiotics and was admitted to the hospital.  Patient does have history of recurrent UTI ESBL.  Hospital Course:   Following conditions were addressed during hospitalization as listed below,  Hematuria with chronic indwelling Foley catheter and recurrent UTI and hematuria with history of ESBL patient was initially put on meropenem.  Continue to hold Plavix.    Foley catheter was replaced in the ED. blood cultures negative so far.  Urine culture showed Pseudomonas and Enterococcus.  Insignificant colony so far.  Patient will continue ciprofloxacin on discharge to complete  the course.  Initial concern for sepsis.  Sepsis has been ruled out.   History of stroke with left-sided weakness Continue statins, resume Plavix on discharge   Anemia  Hemoglobin of 10.6.   Essential hypertension.  Continue lisinopril, hydralazine and isosorbide dinitrate.  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  Wound care team on board, dressing on discharge   Debility, deconditioning.  Marland Kitchen Has walker and wheelchair at home.  Home health will be advised on discharge  Disposition.  At this time, patient is stable for disposition.  Patient was advised to follow-up with your primary care physician as outpatient  Medical Consultants:   None.  Procedures:    Exchange of Foley catheter Subjective:   Today, patient was seen and examined at bedside.  Denies any dysuria, urgency, frequency.  Denies any nausea, vomiting, fever or chills  Discharge Exam:   Vitals:   02/17/21 0700 02/17/21 0800  BP: 130/64 123/67  Pulse: 67 67  Resp: 14 17  Temp: 98.4 F (36.9 C) 98.3 F (36.8 C)  SpO2: 100% 100%   Vitals:   02/17/21 0047 02/17/21 0520 02/17/21 0700 02/17/21 0800  BP: 121/61 119/72 130/64 123/67  Pulse: 71 68 67 67  Resp: '18 17 14 17  '$ Temp: 98.1 F (36.7 C) 98.5 F (36.9 C) 98.4 F (36.9 C) 98.3 F (36.8 C)  TempSrc: Oral Oral  Oral  SpO2: 100% 100% 100% 100%  Weight: 61.9 kg     Height:       General: Alert awake, not in obvious distress.  Thinly built HENT: pupils equally reacting to  light,  No scleral pallor or icterus noted. Oral mucosa is moist.  Chest:  Clear breath sounds.  Diminished breath sounds bilaterally. No crackles or wheezes.  CVS: S1 &S2 heard. No murmur.  Regular rate and rhythm. Abdomen: Soft, nontender, nondistended.  Bowel sounds are heard.  Chronic Foley catheter in place Extremities: No cyanosis, clubbing or edema.  Peripheral pulses are palpable. Psych: Alert, awake and oriented, normal mood CNS:  No  cranial nerve deficits.  Power equal in all extremities.   Skin: Warm and dry.  No rashes noted.  The results of significant diagnostics from this hospitalization (including imaging, microbiology, ancillary and laboratory) are listed below for reference.     Diagnostic Studies:   No results found.   Labs:   Basic Metabolic Panel: Recent Labs  Lab 02/14/21 1631 02/15/21 0138 02/16/21 0433  NA 140 139 137  K 3.8 3.8 4.0  CL 106 108 104  CO2 '29 28 27  '$ GLUCOSE 103* 117* 89  BUN '17 15 17  '$ CREATININE 0.94 0.74 0.66  CALCIUM 10.0 9.4 9.6  MG  --   --  2.1   GFR Estimated Creatinine Clearance: 63.4 mL/min (by C-G formula based on SCr of 0.66 mg/dL). Liver Function Tests: Recent Labs  Lab 02/14/21 1631  AST 13*  ALT 11  ALKPHOS 149*  BILITOT 0.5  PROT 6.4*  ALBUMIN 3.3*   Recent Labs  Lab 02/14/21 1631  LIPASE 27   No results for input(s): AMMONIA in the last 168 hours. Coagulation profile Recent Labs  Lab 02/14/21 1631  INR 1.1    CBC: Recent Labs  Lab 02/14/21 1631 02/14/21 2207 02/15/21 0138 02/15/21 0759 02/16/21 0433  WBC 3.9* 3.6* 3.1* 3.2* 2.9*  NEUTROABS 1.8  --   --   --   --   HGB 11.3* 9.8* 9.9* 10.0* 10.6*  HCT 37.4* 32.6* 32.0* 32.4* 33.5*  MCV 82.4 83.4 81.4 81.2 80.3  PLT 179 156 156 149* 146*   Cardiac Enzymes: No results for input(s): CKTOTAL, CKMB, CKMBINDEX, TROPONINI in the last 168 hours. BNP: Invalid input(s): POCBNP CBG: No results for input(s): GLUCAP in the last 168 hours. D-Dimer No results for input(s): DDIMER in the last 72 hours. Hgb A1c No results for input(s): HGBA1C in the last 72 hours. Lipid Profile No results for input(s): CHOL, HDL, LDLCALC, TRIG, CHOLHDL, LDLDIRECT in the last 72 hours. Thyroid function studies No results for input(s): TSH, T4TOTAL, T3FREE, THYROIDAB in the last 72 hours.  Invalid input(s): FREET3 Anemia work up No results for input(s): VITAMINB12, FOLATE, FERRITIN, TIBC, IRON,  RETICCTPCT in the last 72 hours. Microbiology Recent Results (from the past 240 hour(s))  Culture, blood (routine x 2)     Status: None (Preliminary result)   Collection Time: 02/14/21  5:00 PM   Specimen: BLOOD LEFT HAND  Result Value Ref Range Status   Specimen Description BLOOD LEFT HAND  Final   Special Requests   Final    BOTTLES DRAWN AEROBIC AND ANAEROBIC Blood Culture adequate volume   Culture   Final    NO GROWTH 3 DAYS Performed at [facility], [place] [address] [place], [place] [address]    Report Status PENDING  Incomplete  Culture, blood (routine x 2)     Status: None (Preliminary result)   Collection Time: 02/14/21  5:00 PM   Specimen: BLOOD RIGHT FOREARM  Result Value Ref Range Status   Specimen Description BLOOD RIGHT FOREARM  Final   Special  Requests   Final    BOTTLES DRAWN AEROBIC AND ANAEROBIC Blood Culture results may not be optimal due to an excessive volume of blood received in culture bottles   Culture   Final    NO GROWTH 3 DAYS Performed at [facility], [place] [address], [place], [place] [address]    Report Status PENDING  Incomplete  Resp Panel by RT-PCR (Flu A&B, Covid) Nasopharyngeal Swab     Status: None   Collection Time: 02/14/21  5:00 PM   Specimen: Nasopharyngeal Swab; Nasopharyngeal(NP) swabs in vial transport medium  Result Value Ref Range Status   SARS Coronavirus 2 by RT PCR NEGATIVE NEGATIVE Final    Comment: (NOTE) SARS-CoV-2 target nucleic acids are NOT DETECTED.  The SARS-CoV-2 RNA is generally detectable in upper respiratory specimens during the acute phase of infection. The lowest concentration of SARS-CoV-2 viral copies this assay can detect is 138 copies/mL. A negative result does not preclude SARS-Cov-2 infection and should not be used as the sole basis for treatment or other patient management decisions. A negative result may occur with  improper specimen collection/handling, submission of specimen other than  nasopharyngeal swab, presence of viral mutation(s) within the areas targeted by this assay, and inadequate number of viral copies(<138 copies/mL). A negative result must be combined with clinical observations, patient history, and epidemiological information. The expected result is Negative.  Fact Sheet for Patients:  EntrepreneurPulse.com.au  Fact Sheet for Healthcare Providers:  IncredibleEmployment.be  This test is no t yet approved or cleared by the [place] FDA and  has been authorized for detection and/or diagnosis of SARS-CoV-2 by FDA under an Emergency Use Authorization (EUA). This EUA will remain  in effect (meaning this test can be used) for the duration of the COVID-19 declaration under Section 564(b)(1) of the Act, 21 U.S.C.section 360bbb-3(b)(1), unless the authorization is terminated  or revoked sooner.       Influenza A by PCR NEGATIVE NEGATIVE Final   Influenza B by PCR NEGATIVE NEGATIVE Final    Comment: (NOTE) The Xpert Xpress SARS-CoV-2/FLU/RSV plus assay is intended as an aid in the diagnosis of influenza from Nasopharyngeal swab specimens and should not be used as a sole basis for treatment. Nasal washings and aspirates are unacceptable for Xpert Xpress SARS-CoV-2/FLU/RSV testing.  Fact Sheet for Patients: EntrepreneurPulse.com.au  Fact Sheet for Healthcare Providers: IncredibleEmployment.be  This test is not yet approved or cleared by the [place] FDA and has been authorized for detection and/or diagnosis of SARS-CoV-2 by FDA under an Emergency Use Authorization (EUA). This EUA will remain in effect (meaning this test can be used) for the duration of the COVID-19 declaration under Section 564(b)(1) of the Act, 21 U.S.C. section 360bbb-3(b)(1), unless the authorization is terminated or revoked.  Performed at [facility], [place] [address] [place], [place] [address]    Urine Culture     Status: Abnormal   Collection Time: 02/14/21  7:12 PM   Specimen: Urine, Catheterized  Result Value Ref Range Status   Specimen Description URINE, CATHETERIZED  Final   Special Requests   Final    NONE Performed at [facility], [address] [place], [place] [address]    Culture (A)  Final    20,000 COLONIES/mL PSEUDOMONAS AERUGINOSA 10,000 COLONIES/mL ENTEROCOCCUS FAECALIS    Report Status 02/17/2021 FINAL  Final   Organism ID, Bacteria PSEUDOMONAS AERUGINOSA (A)  Final   Organism ID, Bacteria ENTEROCOCCUS FAECALIS (A)  Final  Susceptibility   Enterococcus faecalis - MIC*    AMPICILLIN <=2 SENSITIVE Sensitive     NITROFURANTOIN <=16 SENSITIVE Sensitive     VANCOMYCIN 1 SENSITIVE Sensitive     * 10,000 COLONIES/mL ENTEROCOCCUS FAECALIS   Pseudomonas aeruginosa - MIC*    CEFTAZIDIME 4 SENSITIVE Sensitive     CIPROFLOXACIN <=0.25 SENSITIVE Sensitive     GENTAMICIN <=1 SENSITIVE Sensitive     IMIPENEM 2 SENSITIVE Sensitive     PIP/TAZO 16 SENSITIVE Sensitive     CEFEPIME 2 SENSITIVE Sensitive     * 20,000 COLONIES/mL PSEUDOMONAS AERUGINOSA     Discharge Instructions:   Discharge Instructions     Diet - low sodium heart healthy   Complete by: As directed    Discharge instructions   Complete by: As directed    Follow up with your primary care provider in one week. Check blood work at that time.complete the course of antibiotics. Seek medical attention for worsening symptoms.   Discharge wound care:   Complete by: As directed    Wound care to right heel and right LE:  Wash with soap and water, pat dry. Apply thin layer of Eucerin cream to both legs. Cover wounds with xeroform gauze, then top with silicone foam. Place feet into Prevalon Boots.   Increase activity slowly   Complete by: As directed       Allergies as of 02/17/2021       Reactions   Aspirin Itching   [employer] fundraiser (m. Mercenaria) Skin Test Itching   Penicillins          Medication List     STOP taking these medications    cephALEXin 250 MG/5ML suspension Commonly known as: KEFLEX       TAKE these medications    acetaminophen 325 MG tablet Commonly known as: TYLENOL Take 650 mg by mouth every 4 (four) hours as needed for mild pain.   amLODipine 10 MG tablet Commonly known as: NORVASC Take 10 mg by mouth daily.   ciprofloxacin 500 MG tablet Commonly known as: Cipro Take 1 tablet (500 mg total) by mouth 2 (two) times daily for 5 days.   citalopram 20 MG tablet Commonly known as: CELEXA Take 20 mg by mouth at bedtime.   clopidogrel 75 MG tablet Commonly known as: PLAVIX Take 75 mg by mouth at bedtime.   docusate sodium 100 MG capsule Commonly known as: COLACE Take 1 capsule (100 mg total) by mouth every 12 (twelve) hours.   finasteride 5 MG tablet Commonly known as: PROSCAR Take 5 mg by mouth daily.   furosemide 20 MG tablet Commonly known as: LASIX Take 20 mg by mouth daily.   hydrALAZINE 50 MG tablet Commonly known as: APRESOLINE Take 50 mg by mouth 2 (two) times daily.   isosorbide mononitrate 60 MG 24 hr tablet Commonly known as: IMDUR Take 60 mg by mouth daily.   lisinopril 40 MG tablet Commonly known as: ZESTRIL Take 40 mg by mouth daily.   nitroGLYCERIN 0.4 MG SL tablet Commonly known as: NITROSTAT Place 0.4 mg under the tongue every 5 (five) minutes as needed for chest pain.   rosuvastatin 10 MG tablet Commonly known as: CRESTOR Take 10 mg by mouth daily.   tamsulosin 0.4 MG Caps capsule Commonly known as: FLOMAX Take 0.4 mg by mouth daily.   vitamin B-12 1000 MCG tablet Commonly known as: CYANOCOBALAMIN Take 1 tablet (1,000 mcg total) by mouth daily.   Vitamin D (Ergocalciferol) 1.25 MG (  50000 UNIT) Caps capsule Commonly known as: DRISDOL Take 50,000 Units by mouth every Monday.               Discharge Care Instructions  (From admission, onward)           Start     Ordered    02/17/21 0000  Discharge wound care:       Comments: Wound care to right heel and right LE:  Wash with soap and water, pat dry. Apply thin layer of Eucerin cream to both legs. Cover wounds with xeroform gauze, then top with silicone foam. Place feet into Prevalon Boots.   02/17/21 0816            Follow-up Information     Fredrich Romans, PA. Go on 02/24/2021.   Specialty: Physician Assistant Why: '@12'$ :00pm Contact information: [address] [place] [address] [place] Follow up.   Why: HHPT                Time coordinating discharge: 39 minutes  Signed:  Goro Wenrick  [facility] 02/17/2021, 2:56 PM

## 2021-02-17 NOTE — Progress Notes (Signed)
AVS reviewed with [name] (pt's granddaughter). All questions answered at this time. Family will provided transportation.

## 2021-02-19 LAB — CULTURE, BLOOD (ROUTINE X 2)
Culture: NO GROWTH
Culture: NO GROWTH
Special Requests: ADEQUATE

## 2021-03-01 ENCOUNTER — Emergency Department (HOSPITAL_COMMUNITY)
Admission: EM | Admit: 2021-03-01 | Discharge: 2021-03-02 | Disposition: A | Discharge status: Home / Self Care | Payer: Medicare Other | Attending: Emergency Medicine | Admitting: Emergency Medicine

## 2021-03-01 DIAGNOSIS — Z79899 Other long term (current) drug therapy: Secondary | ICD-10-CM | POA: Diagnosis not present

## 2021-03-01 DIAGNOSIS — R319 Hematuria, unspecified: Secondary | ICD-10-CM | POA: Insufficient documentation

## 2021-03-01 DIAGNOSIS — T83091A Other mechanical complication of indwelling urethral catheter, initial encounter: Secondary | ICD-10-CM | POA: Insufficient documentation

## 2021-03-01 DIAGNOSIS — D72829 Elevated white blood cell count, unspecified: Secondary | ICD-10-CM | POA: Insufficient documentation

## 2021-03-01 DIAGNOSIS — F17210 Nicotine dependence, cigarettes, uncomplicated: Secondary | ICD-10-CM | POA: Insufficient documentation

## 2021-03-01 DIAGNOSIS — Z950 Presence of cardiac pacemaker: Secondary | ICD-10-CM | POA: Insufficient documentation

## 2021-03-01 DIAGNOSIS — I119 Hypertensive heart disease without heart failure: Secondary | ICD-10-CM | POA: Diagnosis not present

## 2021-03-01 DIAGNOSIS — I2510 Atherosclerotic heart disease of native coronary artery without angina pectoris: Secondary | ICD-10-CM | POA: Diagnosis not present

## 2021-03-01 DIAGNOSIS — Z7902 Long term (current) use of antithrombotics/antiplatelets: Secondary | ICD-10-CM | POA: Diagnosis not present

## 2021-03-01 DIAGNOSIS — Z7689 Persons encountering health services in other specified circumstances: Secondary | ICD-10-CM

## 2021-03-01 DIAGNOSIS — Y846 Urinary catheterization as the cause of abnormal reaction of the patient, or of later complication, without mention of misadventure at the time of the procedure: Secondary | ICD-10-CM | POA: Diagnosis not present

## 2021-03-01 NOTE — ED Provider Notes (Signed)
Emergency Medicine Provider Triage Evaluation Note  Todd Dunn , a 80 y.o. male  was evaluated in triage.  Pt complains of dysuria that began today. 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 Dysuria is accompanied by 1 episode of hematuria. No specific alleviating/aggravating factors.   Review of Systems  Positive: Dysuria, hematuria Negative: N/V, abdominal pain.   Physical Exam  BP 111/68   Pulse 93   Temp 98.3 F (36.8 C) (Oral)   Resp 16   SpO2 100%  Gen:   Awake, no distress   Resp:  Normal effort  Abd:  No peritoneal signs.  Other:  Foley catheter bag present.   Medical Decision Making  Medically screening exam initiated at 11:46 PM.  Appropriate orders placed.  Todd Dunn was informed that the remainder of the evaluation will be completed by another provider, this initial triage assessment does not replace that evaluation, and the importance of remaining in the ED until their evaluation is complete.  Dysuria.    Todd Dunn 03/01/21 2354    Todd Flakes, MD 03/02/21 [phone]

## 2021-03-02 ENCOUNTER — Other Ambulatory Visit: Payer: Self-pay

## 2021-03-02 ENCOUNTER — Emergency Department (HOSPITAL_COMMUNITY)
Admission: EM | Admit: 2021-03-02 | Discharge: 2021-03-02 | Disposition: A | Discharge status: Home / Self Care | Payer: Medicare Other | Source: Home / Self Care | Attending: Emergency Medicine | Admitting: Emergency Medicine

## 2021-03-02 ENCOUNTER — Encounter (HOSPITAL_COMMUNITY): Payer: Self-pay | Admitting: *Deleted

## 2021-03-02 DIAGNOSIS — T83091A Other mechanical complication of indwelling urethral catheter, initial encounter: Secondary | ICD-10-CM | POA: Insufficient documentation

## 2021-03-02 DIAGNOSIS — Z79899 Other long term (current) drug therapy: Secondary | ICD-10-CM | POA: Insufficient documentation

## 2021-03-02 DIAGNOSIS — I2510 Atherosclerotic heart disease of native coronary artery without angina pectoris: Secondary | ICD-10-CM | POA: Insufficient documentation

## 2021-03-02 DIAGNOSIS — F17210 Nicotine dependence, cigarettes, uncomplicated: Secondary | ICD-10-CM | POA: Insufficient documentation

## 2021-03-02 DIAGNOSIS — Y846 Urinary catheterization as the cause of abnormal reaction of the patient, or of later complication, without mention of misadventure at the time of the procedure: Secondary | ICD-10-CM | POA: Insufficient documentation

## 2021-03-02 DIAGNOSIS — T839XXA Unspecified complication of genitourinary prosthetic device, implant and graft, initial encounter: Secondary | ICD-10-CM

## 2021-03-02 DIAGNOSIS — I10 Essential (primary) hypertension: Secondary | ICD-10-CM | POA: Insufficient documentation

## 2021-03-02 LAB — CBC WITH DIFFERENTIAL/PLATELET
Abs Immature Granulocytes: 0.01 10*3/uL (ref 0.00–0.07)
Basophils Absolute: 0 10*3/uL (ref 0.0–0.1)
Basophils Relative: 1 %
Eosinophils Absolute: 0 10*3/uL (ref 0.0–0.5)
Eosinophils Relative: 1 %
HCT: 32.3 % — ABNORMAL LOW (ref 39.0–52.0)
Hemoglobin: 9.8 g/dL — ABNORMAL LOW (ref 13.0–17.0)
Immature Granulocytes: 0 %
Lymphocytes Relative: 33 %
Lymphs Abs: 1.4 10*3/uL (ref 0.7–4.0)
MCH: 25 pg — ABNORMAL LOW (ref 26.0–34.0)
MCHC: 30.3 g/dL (ref 30.0–36.0)
MCV: 82.4 fL (ref 80.0–100.0)
Monocytes Absolute: 0.4 10*3/uL (ref 0.1–1.0)
Monocytes Relative: 10 %
Neutro Abs: 2.3 10*3/uL (ref 1.7–7.7)
Neutrophils Relative %: 55 %
Platelets: 175 10*3/uL (ref 150–400)
RBC: 3.92 MIL/uL — ABNORMAL LOW (ref 4.22–5.81)
RDW: 17.2 % — ABNORMAL HIGH (ref 11.5–15.5)
WBC: 4.1 10*3/uL (ref 4.0–10.5)
nRBC: 0 % (ref 0.0–0.2)

## 2021-03-02 LAB — COMPREHENSIVE METABOLIC PANEL
ALT: 9 U/L (ref 0–44)
AST: 13 U/L — ABNORMAL LOW (ref 15–41)
Albumin: 2.9 g/dL — ABNORMAL LOW (ref 3.5–5.0)
Alkaline Phosphatase: 130 U/L — ABNORMAL HIGH (ref 38–126)
Anion gap: 6 (ref 5–15)
BUN: 20 mg/dL (ref 8–23)
CO2: 29 mmol/L (ref 22–32)
Calcium: 10 mg/dL (ref 8.9–10.3)
Chloride: 107 mmol/L (ref 98–111)
Creatinine, Ser: 0.94 mg/dL (ref 0.61–1.24)
GFR, Estimated: >60 mL/min (ref >60)
Glucose, Bld: 137 mg/dL — ABNORMAL HIGH (ref 70–99)
Potassium: 3.6 mmol/L (ref 3.5–5.1)
Sodium: 142 mmol/L (ref 135–145)
Total Bilirubin: 0.2 mg/dL — ABNORMAL LOW (ref 0.3–1.2)
Total Protein: 6.1 g/dL — ABNORMAL LOW (ref 6.5–8.1)

## 2021-03-02 LAB — URINALYSIS, ROUTINE W REFLEX MICROSCOPIC
Bilirubin Urine: NEGATIVE
Glucose, UA: NEGATIVE mg/dL
Ketones, ur: NEGATIVE mg/dL
Nitrite: NEGATIVE
Protein, ur: 100 mg/dL — AB
RBC / HPF: >50 RBC/hpf — ABNORMAL HIGH (ref 0–5)
Specific Gravity, Urine: 1.014 (ref 1.005–1.030)
pH: 7 (ref 5.0–8.0)

## 2021-03-02 NOTE — ED Notes (Signed)
Patient/caregiver verbalizes understanding of discharge instructions. Opportunity for questioning and answers were provided. Armband removed by staff, pt discharged from ED with granddaughter.

## 2021-03-02 NOTE — ED Provider Notes (Signed)
[name] EMERGENCY DEPARTMENT Provider Note   CSN: [id number] Arrival date & time: 03/02/21  1642     History Chief Complaint  Patient presents with   Dysuria    Todd Dunn is a 80 y.o. male.   Dysuria Presenting symptoms: dysuria    Patient is here with her granddaughter requesting his Foley catheter be replaced.  Patient was seen in the emergency room yesterday.  He has a chronic indwelling Foley catheter.  Initially the concern was of discomfort with his urinary catheter as well as some blood in the urine.  The granddaughter however provides additional information stating that recently he had an episode of loose stools and it contaminated the catheter.  Today the patient had a CBC and metabolic panel.  It showed stable anemia.  No acute electrolyte abnormalities.  Urinalysis also was performed.  It showed primarily hematuria with 11-20 white cells.  Patient's catheter however was not replaced.  Granddaughter still feels that he is having some discomfort.  Patient's had prior stroke so is difficulty communicating.  Past Medical History:  Diagnosis Date   BPH (benign prostatic hyperplasia)    CAD (coronary artery disease)    Chronic indwelling Foley catheter    CVA (cerebral vascular accident) ([place])    HTN (hypertension)    Neurogenic bladder    SSS (sick sinus syndrome) ([place])    Vitamin D deficiency     Patient Active Problem List   Diagnosis Date Noted   Hematuria 02/14/2021   Acute cystitis with hematuria    Normocytic anemia 01/06/2021   Catheter-associated urinary tract infection ([place]) 54/27/0623   Acute metabolic encephalopathy 76/28/3151   Chronic indwelling Foley catheter 12/15/2020   HTN (hypertension) 12/15/2020   History of ESBL E. coli infection 12/15/2020    Past Surgical History:  Procedure Laterality Date   APPENDECTOMY     CARDIAC SURGERY     PACEMAKER PLACEMENT         Family History  Family history unknown: Yes     Social History   Tobacco Use   Smoking status: Some Days    Types: Cigarettes   Smokeless tobacco: Never  Substance Use Topics   Alcohol use: Not Currently   Drug use: Never    Home Medications Prior to Admission medications   Medication Sig Start Date End Date Taking? Authorizing Provider  acetaminophen (TYLENOL) 325 MG tablet Take 650 mg by mouth every 4 (four) hours as needed for mild pain.    [provider]  amLODipine (NORVASC) 10 MG tablet Take 10 mg by mouth daily.    [provider]  citalopram (CELEXA) 20 MG tablet Take 20 mg by mouth at bedtime.    [provider]  clopidogrel (PLAVIX) 75 MG tablet Take 75 mg by mouth at bedtime.    [provider]  docusate sodium (COLACE) 100 MG capsule Take 1 capsule (100 mg total) by mouth every 12 (twelve) hours. 12/25/20   [name], MD  finasteride (PROSCAR) 5 MG tablet Take 5 mg by mouth daily.    [provider]  furosemide (LASIX) 20 MG tablet Take 20 mg by mouth daily.    [provider]  hydrALAZINE (APRESOLINE) 50 MG tablet Take 50 mg by mouth 2 (two) times daily.    [provider]  isosorbide mononitrate (IMDUR) 60 MG 24 hr tablet Take 60 mg by mouth daily.    [provider]  lisinopril (ZESTRIL) 40 MG tablet Take  40 mg by mouth daily.    [provider]  nitroGLYCERIN (NITROSTAT) 0.4 MG SL tablet Place 0.4 mg under the tongue every 5 (five) minutes as needed for chest pain.    [provider]  rosuvastatin (CRESTOR) 10 MG tablet Take 10 mg by mouth daily.    [provider]  tamsulosin (FLOMAX) 0.4 MG CAPS capsule Take 0.4 mg by mouth daily.    [provider]  vitamin B-12 (CYANOCOBALAMIN) 1000 MCG tablet Take 1 tablet (1,000 mcg total) by mouth daily. 01/08/21   Lincoln Brigham, PA-C  Vitamin D, Ergocalciferol, (DRISDOL) 1.25 MG (50000 UNIT) CAPS capsule Take 50,000 Units by mouth every Monday.    [provider]    Allergies    Aspirin, Northern quahog clam (m. mercenaria) skin test, and Penicillins  Review of Systems   Review of Systems  Genitourinary:  Positive for dysuria.  All other systems reviewed and are negative.  Physical Exam Updated Vital Signs BP (!) 155/78   Pulse 88   Temp 98.6 F (37 C)   Resp 17   SpO2 98%   Physical Exam Vitals and nursing note reviewed.  Constitutional:      General: He is not in acute distress.    Appearance: He is well-developed.  HENT:     Head: Normocephalic and atraumatic.     Right Ear: External ear normal.     Left Ear: External ear normal.  Eyes:     General: No scleral icterus.       Right eye: No discharge.        Left eye: No discharge.     Conjunctiva/sclera: Conjunctivae normal.  Neck:     Trachea: No tracheal deviation.  Cardiovascular:     Rate and Rhythm: Normal rate.  Pulmonary:     Effort: Pulmonary effort is normal. No respiratory distress.     Breath sounds: No stridor.  Abdominal:     General: There is no distension.  Genitourinary:    Comments: Yellow urine noted in catheter bag Musculoskeletal:        General: No swelling or deformity.     Cervical back: Neck supple.  Skin:    General: Skin is warm and dry.     Findings: No rash.  Neurological:     Mental Status: He is alert. Mental status is at baseline.    ED Results / Procedures / Treatments   Labs (all labs ordered are listed, but only abnormal results are displayed) Labs Reviewed  URINE CULTURE    Procedures Procedures   Medications Ordered in ED Medications - No data to display  ED Course  I have reviewed the triage vital signs and the nursing notes.  Pertinent labs & imaging results that were available during my care of the patient were reviewed by me and considered in my medical decision making (see chart for details).    MDM Rules/Calculators/A&P                           Prior records reviewed.  Patient had CBC and  metabolic panel.  He also had a urinalysis.  With his dysuria we will send off urine culture but no need for repeat urinalysis as this was done yesterday.  Patient is afebrile.  We will replace his Foley catheter as requested by his family member.  It was recently contaminated with stool and this is reasonable. Final Clinical Impression(s) /  ED Diagnoses Final diagnoses:  Problem with Foley catheter, initial encounter [facility])    Rx / DC Orders ED Discharge Orders     None        Dorie Rank, MD 03/02/21 2231

## 2021-03-02 NOTE — Discharge Instructions (Addendum)
You have been seen and discharged from the emergency department.  Follow-up with your primary provider for reevaluation and further care. Take home medications as prescribed. If you have any worsening symptoms or further concerns for your health please return to an emergency department for further evaluation. 

## 2021-03-02 NOTE — Discharge Instructions (Addendum)
A urine culture was sent off this evening.  Follow-up with your urologist as planned

## 2021-03-02 NOTE — ED Provider Notes (Signed)
[facility] EMERGENCY DEPARTMENT Provider Note   CSN: [id number] Arrival date & time: 03/01/21  2212     History No chief complaint on file.   Todd Dunn is a 80 y.o. male.  HPI   80 year old male with pmhx BPH and chronic indwelling foley, previous CVA, HTN, SSS with PM, CAD presents with concern for painful foley and once episode of frank hematuria. He continues to have indwelling foley, has had multiple UTIs prior. Recent admission for hematuria and UTI, just finished oral antibiotics. Denies fever, CP, SOB, n/v, flank pain, abdominal pain.  Past Medical History:  Diagnosis Date   BPH (benign prostatic hyperplasia)    CAD (coronary artery disease)    Chronic indwelling Foley catheter    CVA (cerebral vascular accident) ([place])    HTN (hypertension)    Neurogenic bladder    SSS (sick sinus syndrome) ([place])    Vitamin D deficiency     Patient Active Problem List   Diagnosis Date Noted   Hematuria 02/14/2021   Acute cystitis with hematuria    Normocytic anemia 01/06/2021   Catheter-associated urinary tract infection ([place]) 69/67/8938   Acute metabolic encephalopathy 03/23/5101   Chronic indwelling Foley catheter 12/15/2020   HTN (hypertension) 12/15/2020   History of ESBL E. coli infection 12/15/2020    Past Surgical History:  Procedure Laterality Date   APPENDECTOMY     CARDIAC SURGERY     PACEMAKER PLACEMENT         Family History  Family history unknown: Yes    Social History   Tobacco Use   Smoking status: Some Days    Types: Cigarettes   Smokeless tobacco: Never  Substance Use Topics   Alcohol use: Not Currently   Drug use: Never    Home Medications Prior to Admission medications   Medication Sig Start Date End Date Taking? Authorizing Provider  acetaminophen (TYLENOL) 325 MG tablet Take 650 mg by mouth every 4 (four) hours as needed for mild pain.    [provider]  amLODipine (NORVASC) 10 MG tablet Take 10 mg  by mouth daily.    [provider]  citalopram (CELEXA) 20 MG tablet Take 20 mg by mouth at bedtime.    [provider]  clopidogrel (PLAVIX) 75 MG tablet Take 75 mg by mouth at bedtime.    [provider]  docusate sodium (COLACE) 100 MG capsule Take 1 capsule (100 mg total) by mouth every 12 (twelve) hours. 12/25/20   [name], MD  finasteride (PROSCAR) 5 MG tablet Take 5 mg by mouth daily.    [provider]  furosemide (LASIX) 20 MG tablet Take 20 mg by mouth daily.    [provider]  hydrALAZINE (APRESOLINE) 50 MG tablet Take 50 mg by mouth 2 (two) times daily.    [provider]  isosorbide mononitrate (IMDUR) 60 MG 24 hr tablet Take 60 mg by mouth daily.    [provider]  lisinopril (ZESTRIL) 40 MG tablet Take 40 mg by mouth daily.    [provider]  nitroGLYCERIN (NITROSTAT) 0.4 MG SL tablet Place 0.4 mg under the tongue every 5 (five) minutes as needed for chest pain.    [provider]  rosuvastatin (CRESTOR) 10 MG tablet Take 10 mg by mouth daily.    [provider]  tamsulosin (FLOMAX) 0.4 MG CAPS capsule Take 0.4 mg by mouth daily.    [provider]  vitamin B-12 (CYANOCOBALAMIN)  1000 MCG tablet Take 1 tablet (1,000 mcg total) by mouth daily. 01/08/21   Lincoln Brigham, PA-C  Vitamin D, Ergocalciferol, (DRISDOL) 1.25 MG (50000 UNIT) CAPS capsule Take 50,000 Units by mouth every Monday.    [provider]    Allergies    Aspirin, Northern quahog clam (m. mercenaria) skin test, and Penicillins  Review of Systems   Review of Systems  Constitutional:  Negative for chills and fever.  HENT:  Negative for congestion.   Eyes:  Negative for visual disturbance.  Respiratory:  Negative for shortness of breath.   Cardiovascular:  Negative for chest pain.  Gastrointestinal:  Negative for abdominal pain, diarrhea, nausea and vomiting.  Genitourinary:  Positive for hematuria.  Negative for dysuria and flank pain.  Skin:  Negative for rash.  Neurological:  Negative for headaches.   Physical Exam Updated Vital Signs BP (!) 156/99   Pulse 67   Temp 98.5 F (36.9 C) (Oral)   Resp 15   Ht 6\' 1"  (1.854 m)   Wt 61.9 kg   SpO2 100%   BMI 18.00 kg/m   Physical Exam Vitals and nursing note reviewed.  Constitutional:      General: He is not in acute distress.    Appearance: Normal appearance.  HENT:     Head: Normocephalic.     Mouth/Throat:     Mouth: Mucous membranes are moist.  Cardiovascular:     Rate and Rhythm: Normal rate.  Pulmonary:     Effort: Pulmonary effort is normal. No respiratory distress.  Abdominal:     Palpations: Abdomen is soft.     Tenderness: There is no abdominal tenderness.  Genitourinary:    Penis: Normal.      Comments: Foley draining yellow urine Skin:    General: Skin is warm.  Neurological:     Mental Status: He is alert and oriented to person, place, and time. Mental status is at baseline.     Comments: At times mumbling and hard to understands but otherwise answers questions and oriented.  Psychiatric:        Mood and Affect: Mood normal.    ED Results / Procedures / Treatments   Labs (all labs ordered are listed, but only abnormal results are displayed) Labs Reviewed  COMPREHENSIVE METABOLIC PANEL - Abnormal; Notable for the following components:      Result Value   Glucose, Bld 137 (*)    Total Protein 6.1 (*)    Albumin 2.9 (*)    AST 13 (*)    Alkaline Phosphatase 130 (*)    Total Bilirubin 0.2 (*)    All other components within normal limits  CBC WITH DIFFERENTIAL/PLATELET - Abnormal; Notable for the following components:   RBC 3.92 (*)    Hemoglobin 9.8 (*)    HCT 32.3 (*)    MCH 25.0 (*)    RDW 17.2 (*)    All other components within normal limits  URINALYSIS, ROUTINE W REFLEX MICROSCOPIC    EKG None  Radiology No results found.  Procedures Procedures   Medications Ordered in  ED Medications - No data to display  ED Course  I have reviewed the triage vital signs and the nursing notes.  Pertinent labs & imaging results that were available during my care of the patient were reviewed by me and considered in my medical decision making (see chart for details).    MDM Rules/Calculators/A&P  80 year old male presents emergency department concern for mild discomfort from his Foley catheter and hematuria.  Patient does have the Foley catheter replaced at his recent admission for hematuria/UTI.  He completed an outpatient regimen of antibiotics.  Denies any other acute illness including fever, nausea/vomiting, flank pain.  Blood work is reassuring, baseline.  Urinalysis has blood, rare bacteria and mild leukocytes but is not nitrate positive, low suspicion for acute UTI.  Will send for culture.  Patient has been able to eat and drink and states that he feels baseline.  Vitals are stable.  Patient at this time appears safe and stable for discharge and will be treated as an outpatient.  Discharge plan and strict return to ED precautions discussed, patient verbalizes understanding and agreement.  Final Clinical Impression(s) / ED Diagnoses Final diagnoses:  None    Rx / DC Orders ED Discharge Orders     None        Lorelle Gibbs, DO 03/02/21 1528

## 2021-03-02 NOTE — ED Triage Notes (Signed)
Ptc/o  some pain in his rt leg  he has a foley cath ?? Reason  pts speech is very difficult to understand

## 2021-03-02 NOTE — ED Provider Notes (Signed)
Emergency Medicine Provider Triage Evaluation Note  Todd Dunn , a 80 y.o. male  was evaluated in triage.  Family concerned that pt is having pain at his foley catheter site. He was seen in the ed earlier today for similar sxs and was discharged in stable condition.  Review of Systems  Positive: Painful foley catheter Negative: vomiting  Physical Exam  There were no vitals taken for this visit. Gen:   Awake, no distress   Resp:  Normal effort  MSK:   Moves extremities without difficulty   Medical Decision Making  Medically screening exam initiated at 4:55 PM.  Appropriate orders placed.  Todd Dunn was informed that the remainder of the evaluation will be completed by another provider, this initial triage assessment does not replace that evaluation, and the importance of remaining in the ED until their evaluation is complete.     Rodney Booze, PA-C 03/02/21 1656    Lucrezia Starch, MD 03/03/21 [phone]

## 2021-03-02 NOTE — ED Triage Notes (Signed)
Patient with indwelling urinary catheter brought in by granddaughter with patient complaining of burning in his penis. Granddaughter states patient had an episode of diarrhea yesterday in his brief and granddaughter states some of the stool got on his catheter. Granddaughter requesting changing of indwelling cathether.

## 2021-03-05 LAB — URINE CULTURE: Culture: >=100000 — AB

## 2021-03-06 ENCOUNTER — Telehealth: Payer: Self-pay | Admitting: *Deleted

## 2021-03-06 NOTE — Progress Notes (Signed)
ED Antimicrobial Stewardship Positive Culture Follow Up   Todd Dunn is an 80 y.o. male who presented to [facility] on 03/02/2021 with a chief complaint of  Chief Complaint  Patient presents with   Dysuria    Recent Results (from the past 720 hour(s))  Culture, blood (routine x 2)     Status: None   Collection Time: 02/14/21  5:00 PM   Specimen: BLOOD LEFT HAND  Result Value Ref Range Status   Specimen Description BLOOD LEFT HAND  Final   Special Requests   Final    BOTTLES DRAWN AEROBIC AND ANAEROBIC Blood Culture adequate volume   Culture   Final    NO GROWTH 5 DAYS Performed at [facility], [address] [place], [place] [address]    Report Status 02/19/2021 FINAL  Final  Culture, blood (routine x 2)     Status: None   Collection Time: 02/14/21  5:00 PM   Specimen: BLOOD RIGHT FOREARM  Result Value Ref Range Status   Specimen Description BLOOD RIGHT FOREARM  Final   Special Requests   Final    BOTTLES DRAWN AEROBIC AND ANAEROBIC Blood Culture results may not be optimal due to an excessive volume of blood received in culture bottles   Culture   Final    NO GROWTH 5 DAYS Performed at [facility], [place] [address], [place], [place] [address]    Report Status 02/19/2021 FINAL  Final  Resp Panel by RT-PCR (Flu A&B, Covid) Nasopharyngeal Swab     Status: None   Collection Time: 02/14/21  5:00 PM   Specimen: Nasopharyngeal Swab; Nasopharyngeal(NP) swabs in vial transport medium  Result Value Ref Range Status   SARS Coronavirus 2 by RT PCR NEGATIVE NEGATIVE Final    Comment: (NOTE) SARS-CoV-2 target nucleic acids are NOT DETECTED.  The SARS-CoV-2 RNA is generally detectable in upper respiratory specimens during the acute phase of infection. The lowest concentration of SARS-CoV-2 viral copies this assay can detect is 138 copies/mL. A negative result does not preclude SARS-Cov-2 infection and should not be used as the sole basis for treatment or other  patient management decisions. A negative result may occur with  improper specimen collection/handling, submission of specimen other than nasopharyngeal swab, presence of viral mutation(s) within the areas targeted by this assay, and inadequate number of viral copies(<138 copies/mL). A negative result must be combined with clinical observations, patient history, and epidemiological information. The expected result is Negative.  Fact Sheet for Patients:  EntrepreneurPulse.com.au  Fact Sheet for Healthcare Providers:  IncredibleEmployment.be  This test is no t yet approved or cleared by the [place] FDA and  has been authorized for detection and/or diagnosis of SARS-CoV-2 by FDA under an Emergency Use Authorization (EUA). This EUA will remain  in effect (meaning this test can be used) for the duration of the COVID-19 declaration under Section 564(b)(1) of the Act, 21 U.S.C.section 360bbb-3(b)(1), unless the authorization is terminated  or revoked sooner.       Influenza A by PCR NEGATIVE NEGATIVE Final   Influenza B by PCR NEGATIVE NEGATIVE Final    Comment: (NOTE) The Xpert Xpress SARS-CoV-2/FLU/RSV plus assay is intended as an aid in the diagnosis of influenza from Nasopharyngeal swab specimens and should not be used as a sole basis for treatment. Nasal washings and aspirates are unacceptable for Xpert Xpress SARS-CoV-2/FLU/RSV testing.  Fact Sheet for Patients: EntrepreneurPulse.com.au  Fact Sheet for Healthcare Providers: IncredibleEmployment.be  This test is  not yet approved or cleared by the [place] and has been authorized for detection and/or diagnosis of SARS-CoV-2 by FDA under an Emergency Use Authorization (EUA). This EUA will remain in effect (meaning this test can be used) for the duration of the COVID-19 declaration under Section 564(b)(1) of the Act, 21 U.S.C. section  360bbb-3(b)(1), unless the authorization is terminated or revoked.  Performed at [facility], [place] [address], [place], [place] [address]   Urine Culture     Status: Abnormal   Collection Time: 02/14/21  7:12 PM   Specimen: Urine, Catheterized  Result Value Ref Range Status   Specimen Description URINE, CATHETERIZED  Final   Special Requests   Final    NONE Performed at [facility], [address], [place], [place] [address]    Culture (A)  Final    20,000 COLONIES/mL PSEUDOMONAS AERUGINOSA 10,000 COLONIES/mL ENTEROCOCCUS FAECALIS    Report Status 02/17/2021 FINAL  Final   Organism ID, Bacteria PSEUDOMONAS AERUGINOSA (A)  Final   Organism ID, Bacteria ENTEROCOCCUS FAECALIS (A)  Final      Susceptibility   Enterococcus faecalis - MIC*    AMPICILLIN <=2 SENSITIVE Sensitive     NITROFURANTOIN <=16 SENSITIVE Sensitive     VANCOMYCIN 1 SENSITIVE Sensitive     * 10,000 COLONIES/mL ENTEROCOCCUS FAECALIS   Pseudomonas aeruginosa - MIC*    CEFTAZIDIME 4 SENSITIVE Sensitive     CIPROFLOXACIN <=0.25 SENSITIVE Sensitive     GENTAMICIN <=1 SENSITIVE Sensitive     IMIPENEM 2 SENSITIVE Sensitive     PIP/TAZO 16 SENSITIVE Sensitive     CEFEPIME 2 SENSITIVE Sensitive     * 20,000 COLONIES/mL PSEUDOMONAS AERUGINOSA  Urine Culture     Status: Abnormal   Collection Time: 03/02/21 10:50 PM   Specimen: In/Out Cath Urine  Result Value Ref Range Status   Specimen Description IN/OUT CATH URINE  Final   Special Requests   Final    NONE Performed at [facility], [place] [address] [place], [place] [address]    Culture (A)  Final    >=100,000 COLONIES/mL VANCOMYCIN RESISTANT ENTEROCOCCUS ISOLATED 50,000 COLONIES/mL STAPHYLOCOCCUS CAPITIS    Report Status 03/05/2021 FINAL  Final   Organism ID, Bacteria VANCOMYCIN RESISTANT ENTEROCOCCUS ISOLATED (A)  Final   Organism ID, Bacteria STAPHYLOCOCCUS CAPITIS (A)  Final      Susceptibility   Staphylococcus capitis - MIC*     CIPROFLOXACIN >=8 RESISTANT Resistant     GENTAMICIN >=16 RESISTANT Resistant     NITROFURANTOIN <=16 SENSITIVE Sensitive     OXACILLIN >=4 RESISTANT Resistant     TETRACYCLINE <=1 SENSITIVE Sensitive     VANCOMYCIN 1 SENSITIVE Sensitive     TRIMETH/SULFA 20 SENSITIVE Sensitive     CLINDAMYCIN >=8 RESISTANT Resistant     RIFAMPIN <=0.5 SENSITIVE Sensitive     Inducible Clindamycin NEGATIVE Sensitive     * 50,000 COLONIES/mL STAPHYLOCOCCUS CAPITIS   Vancomycin resistant enterococcus isolated - MIC*    AMPICILLIN <=2 SENSITIVE Sensitive     NITROFURANTOIN <=16 SENSITIVE Sensitive     VANCOMYCIN >=32 RESISTANT Resistant     LINEZOLID 2 SENSITIVE Sensitive     * >=100,000 COLONIES/mL VANCOMYCIN RESISTANT ENTEROCOCCUS ISOLATED    80 yo male presented with dysuria in the setting of neurogenic bladder with BPH and a chronic indwelling foley with PMH significant for recurrent UTI w/ ESBL organisms. On 9/26 ED visit, patient was afebrile, had normal white count and UA  was significant for leukocytes and WBC. Patient's foley was replaced. Urine culture grew out >100k of VRE and 50k staph capitis, likely colonizers due to chronic indwelling cath.  Plan to call patient for symptom check. If asymptomatic, no antibiotics needed. If still symptomatic, start Macrobid 100 mg BID x7 days.  ED Provider: Dr. Godfrey Pick, MD   Ralph Dowdy 03/06/2021, 9:23 AM Clinical Pharmacist Monday - Friday phone -  [phone] Saturday - Sunday phone - [phone]

## 2021-03-06 NOTE — Telephone Encounter (Signed)
Post ED Visit - Positive Culture Follow-up: Successful Patient Follow-Up  Culture assessed and recommendations reviewed by:  []  Elenor Quinones, Pharm.D. []  Heide Guile, Pharm.D., BCPS AQ-ID []  Parks Neptune, Pharm.D., BCPS []  Alycia Rossetti, Pharm.D., BCPS []  Sisters, Pharm.D., BCPS, AAHIVP []  Legrand Como, Pharm.D., BCPS, AAHIVP []  Salome Arnt, PharmD, BCPS []  Johnnette Gourd, PharmD, BCPS []  Hughes Better, PharmD, BCPS []  Leeroy Cha, PharmD  Positive urine culture  [x]  Patient discharged without antimicrobial prescription and treatment is now indicated []  Organism is resistant to prescribed ED discharge antimicrobial []  Patient with positive blood cultures  Changes discussed with ED provider: Margaretmary Lombard, PA-C New antibiotic prescription Macrobid 100mg  PO BID x 7 days Called to [place], [place]  Contacted daughter of patient, date 03/06/2021, time [place], [place] 03/06/2021, 10:08 AM

## 2021-03-20 ENCOUNTER — Emergency Department (HOSPITAL_COMMUNITY): Payer: Medicare Other

## 2021-03-20 ENCOUNTER — Inpatient Hospital Stay (HOSPITAL_COMMUNITY)
Admission: EM | Admit: 2021-03-20 | Discharge: 2021-04-05 | DRG: 579 | Disposition: A | Discharge status: Skilled Nursing Facility | Payer: Medicare Other | Attending: Family Medicine | Admitting: Family Medicine

## 2021-03-20 DIAGNOSIS — I69354 Hemiplegia and hemiparesis following cerebral infarction affecting left non-dominant side: Secondary | ICD-10-CM | POA: Diagnosis not present

## 2021-03-20 DIAGNOSIS — T83518A Infection and inflammatory reaction due to other urinary catheter, initial encounter: Secondary | ICD-10-CM | POA: Diagnosis present

## 2021-03-20 DIAGNOSIS — I700 Atherosclerosis of aorta: Secondary | ICD-10-CM | POA: Diagnosis present

## 2021-03-20 DIAGNOSIS — L97519 Non-pressure chronic ulcer of other part of right foot with unspecified severity: Secondary | ICD-10-CM | POA: Diagnosis present

## 2021-03-20 DIAGNOSIS — I495 Sick sinus syndrome: Secondary | ICD-10-CM | POA: Diagnosis present

## 2021-03-20 DIAGNOSIS — I69351 Hemiplegia and hemiparesis following cerebral infarction affecting right dominant side: Secondary | ICD-10-CM | POA: Diagnosis not present

## 2021-03-20 DIAGNOSIS — Z79899 Other long term (current) drug therapy: Secondary | ICD-10-CM | POA: Diagnosis not present

## 2021-03-20 DIAGNOSIS — F17210 Nicotine dependence, cigarettes, uncomplicated: Secondary | ICD-10-CM | POA: Diagnosis present

## 2021-03-20 DIAGNOSIS — D509 Iron deficiency anemia, unspecified: Secondary | ICD-10-CM | POA: Diagnosis present

## 2021-03-20 DIAGNOSIS — R748 Abnormal levels of other serum enzymes: Secondary | ICD-10-CM | POA: Diagnosis not present

## 2021-03-20 DIAGNOSIS — E785 Hyperlipidemia, unspecified: Secondary | ICD-10-CM | POA: Diagnosis present

## 2021-03-20 DIAGNOSIS — B952 Enterococcus as the cause of diseases classified elsewhere: Secondary | ICD-10-CM | POA: Diagnosis present

## 2021-03-20 DIAGNOSIS — Z681 Body mass index (BMI) 19 or less, adult: Secondary | ICD-10-CM | POA: Diagnosis not present

## 2021-03-20 DIAGNOSIS — Z886 Allergy status to analgesic agent status: Secondary | ICD-10-CM

## 2021-03-20 DIAGNOSIS — E43 Unspecified severe protein-calorie malnutrition: Secondary | ICD-10-CM | POA: Diagnosis present

## 2021-03-20 DIAGNOSIS — Z880 Allergy status to penicillin: Secondary | ICD-10-CM

## 2021-03-20 DIAGNOSIS — I10 Essential (primary) hypertension: Secondary | ICD-10-CM | POA: Diagnosis present

## 2021-03-20 DIAGNOSIS — G9341 Metabolic encephalopathy: Secondary | ICD-10-CM | POA: Diagnosis present

## 2021-03-20 DIAGNOSIS — S91201A Unspecified open wound of right great toe with damage to nail, initial encounter: Secondary | ICD-10-CM | POA: Diagnosis present

## 2021-03-20 DIAGNOSIS — F419 Anxiety disorder, unspecified: Secondary | ICD-10-CM | POA: Diagnosis present

## 2021-03-20 DIAGNOSIS — I70235 Atherosclerosis of native arteries of right leg with ulceration of other part of foot: Secondary | ICD-10-CM | POA: Diagnosis not present

## 2021-03-20 DIAGNOSIS — I70261 Atherosclerosis of native arteries of extremities with gangrene, right leg: Secondary | ICD-10-CM | POA: Diagnosis present

## 2021-03-20 DIAGNOSIS — R079 Chest pain, unspecified: Secondary | ICD-10-CM

## 2021-03-20 DIAGNOSIS — Z8249 Family history of ischemic heart disease and other diseases of the circulatory system: Secondary | ICD-10-CM

## 2021-03-20 DIAGNOSIS — F32A Depression, unspecified: Secondary | ICD-10-CM | POA: Diagnosis present

## 2021-03-20 DIAGNOSIS — N390 Urinary tract infection, site not specified: Secondary | ICD-10-CM | POA: Diagnosis present

## 2021-03-20 DIAGNOSIS — L089 Local infection of the skin and subcutaneous tissue, unspecified: Principal | ICD-10-CM | POA: Diagnosis present

## 2021-03-20 DIAGNOSIS — I739 Peripheral vascular disease, unspecified: Secondary | ICD-10-CM | POA: Diagnosis not present

## 2021-03-20 DIAGNOSIS — I2510 Atherosclerotic heart disease of native coronary artery without angina pectoris: Secondary | ICD-10-CM | POA: Diagnosis present

## 2021-03-20 DIAGNOSIS — M869 Osteomyelitis, unspecified: Secondary | ICD-10-CM

## 2021-03-20 DIAGNOSIS — N400 Enlarged prostate without lower urinary tract symptoms: Secondary | ICD-10-CM | POA: Diagnosis present

## 2021-03-20 DIAGNOSIS — Y846 Urinary catheterization as the cause of abnormal reaction of the patient, or of later complication, without mention of misadventure at the time of the procedure: Secondary | ICD-10-CM | POA: Diagnosis present

## 2021-03-20 DIAGNOSIS — N319 Neuromuscular dysfunction of bladder, unspecified: Secondary | ICD-10-CM | POA: Diagnosis present

## 2021-03-20 DIAGNOSIS — T83511D Infection and inflammatory reaction due to indwelling urethral catheter, subsequent encounter: Secondary | ICD-10-CM | POA: Diagnosis not present

## 2021-03-20 DIAGNOSIS — Z950 Presence of cardiac pacemaker: Secondary | ICD-10-CM

## 2021-03-20 DIAGNOSIS — T148XXA Other injury of unspecified body region, initial encounter: Secondary | ICD-10-CM | POA: Diagnosis not present

## 2021-03-20 DIAGNOSIS — Z01810 Encounter for preprocedural cardiovascular examination: Secondary | ICD-10-CM | POA: Diagnosis not present

## 2021-03-20 DIAGNOSIS — R471 Dysarthria and anarthria: Secondary | ICD-10-CM | POA: Diagnosis present

## 2021-03-20 DIAGNOSIS — Z20822 Contact with and (suspected) exposure to covid-19: Secondary | ICD-10-CM | POA: Diagnosis present

## 2021-03-20 DIAGNOSIS — Z87440 Personal history of urinary (tract) infections: Secondary | ICD-10-CM

## 2021-03-20 DIAGNOSIS — Z978 Presence of other specified devices: Secondary | ICD-10-CM

## 2021-03-20 DIAGNOSIS — Z993 Dependence on wheelchair: Secondary | ICD-10-CM

## 2021-03-20 DIAGNOSIS — Z7902 Long term (current) use of antithrombotics/antiplatelets: Secondary | ICD-10-CM | POA: Diagnosis not present

## 2021-03-20 DIAGNOSIS — Z1621 Resistance to vancomycin: Secondary | ICD-10-CM | POA: Diagnosis present

## 2021-03-20 DIAGNOSIS — I255 Ischemic cardiomyopathy: Secondary | ICD-10-CM | POA: Diagnosis present

## 2021-03-20 DIAGNOSIS — Z951 Presence of aortocoronary bypass graft: Secondary | ICD-10-CM

## 2021-03-20 DIAGNOSIS — Z781 Physical restraint status: Secondary | ICD-10-CM

## 2021-03-20 DIAGNOSIS — E559 Vitamin D deficiency, unspecified: Secondary | ICD-10-CM | POA: Diagnosis present

## 2021-03-20 DIAGNOSIS — R1310 Dysphagia, unspecified: Secondary | ICD-10-CM | POA: Diagnosis present

## 2021-03-20 DIAGNOSIS — Z888 Allergy status to other drugs, medicaments and biological substances status: Secondary | ICD-10-CM

## 2021-03-20 LAB — CBC WITH DIFFERENTIAL/PLATELET
Abs Immature Granulocytes: 0.03 10*3/uL (ref 0.00–0.07)
Basophils Absolute: 0 10*3/uL (ref 0.0–0.1)
Basophils Relative: 1 %
Eosinophils Absolute: 0 10*3/uL (ref 0.0–0.5)
Eosinophils Relative: 0 %
HCT: 35.3 % — ABNORMAL LOW (ref 39.0–52.0)
Hemoglobin: 10.4 g/dL — ABNORMAL LOW (ref 13.0–17.0)
Immature Granulocytes: 1 %
Lymphocytes Relative: 25 %
Lymphs Abs: 1.4 10*3/uL (ref 0.7–4.0)
MCH: 24.6 pg — ABNORMAL LOW (ref 26.0–34.0)
MCHC: 29.5 g/dL — ABNORMAL LOW (ref 30.0–36.0)
MCV: 83.6 fL (ref 80.0–100.0)
Monocytes Absolute: 0.4 10*3/uL (ref 0.1–1.0)
Monocytes Relative: 7 %
Neutro Abs: 3.8 10*3/uL (ref 1.7–7.7)
Neutrophils Relative %: 66 %
Platelets: 229 10*3/uL (ref 150–400)
RBC: 4.22 MIL/uL (ref 4.22–5.81)
RDW: 17.2 % — ABNORMAL HIGH (ref 11.5–15.5)
WBC: 5.7 10*3/uL (ref 4.0–10.5)
nRBC: 0 % (ref 0.0–0.2)

## 2021-03-20 LAB — PREALBUMIN: Prealbumin: 15.7 mg/dL — ABNORMAL LOW (ref 18–38)

## 2021-03-20 LAB — URINALYSIS, ROUTINE W REFLEX MICROSCOPIC
Bilirubin Urine: NEGATIVE
Glucose, UA: NEGATIVE mg/dL
Ketones, ur: NEGATIVE mg/dL
Nitrite: NEGATIVE
Protein, ur: NEGATIVE mg/dL
Specific Gravity, Urine: 1.012 (ref 1.005–1.030)
WBC, UA: >50 WBC/hpf — ABNORMAL HIGH (ref 0–5)
pH: 7 (ref 5.0–8.0)

## 2021-03-20 LAB — LACTIC ACID, PLASMA
Lactic Acid, Venous: 1 mmol/L (ref 0.5–1.9)
Lactic Acid, Venous: 1 mmol/L (ref 0.5–1.9)

## 2021-03-20 LAB — COMPREHENSIVE METABOLIC PANEL
ALT: 16 U/L (ref 0–44)
AST: 16 U/L (ref 15–41)
Albumin: 3.2 g/dL — ABNORMAL LOW (ref 3.5–5.0)
Alkaline Phosphatase: 163 U/L — ABNORMAL HIGH (ref 38–126)
Anion gap: 5 (ref 5–15)
BUN: 20 mg/dL (ref 8–23)
CO2: 29 mmol/L (ref 22–32)
Calcium: 9.8 mg/dL (ref 8.9–10.3)
Chloride: 104 mmol/L (ref 98–111)
Creatinine, Ser: 0.84 mg/dL (ref 0.61–1.24)
GFR, Estimated: >60 mL/min (ref >60)
Glucose, Bld: 113 mg/dL — ABNORMAL HIGH (ref 70–99)
Potassium: 4 mmol/L (ref 3.5–5.1)
Sodium: 138 mmol/L (ref 135–145)
Total Bilirubin: 0.6 mg/dL (ref 0.3–1.2)
Total Protein: 6.9 g/dL (ref 6.5–8.1)

## 2021-03-20 LAB — C-REACTIVE PROTEIN: CRP: 1.1 mg/dL — ABNORMAL HIGH (ref <1.0)

## 2021-03-20 LAB — SEDIMENTATION RATE: Sed Rate: 22 mm/hr — ABNORMAL HIGH (ref 0–16)

## 2021-03-20 MED ORDER — ONDANSETRON HCL 4 MG/2ML IJ SOLN: 4.0000 mg | Freq: Four times a day (QID) | INTRAMUSCULAR | Status: DC | PRN

## 2021-03-20 MED ORDER — HYDROCODONE-ACETAMINOPHEN 5-325 MG PO TABS
1.0000 | ORAL_TABLET | Freq: Four times a day (QID) | ORAL | Status: DC | PRN
Start: 2021-03-20 — End: 2021-04-01
  Administered 2021-03-21 – 2021-03-31 (×17): 1 via ORAL
  Filled 2021-03-20 (×20): qty 1

## 2021-03-20 MED ORDER — ISOSORBIDE MONONITRATE ER 60 MG PO TB24
60.0000 mg | ORAL_TABLET | Freq: Every day | ORAL | Status: DC
  Administered 2021-03-21 – 2021-04-05 (×13): 60 mg via ORAL
  Filled 2021-03-20 (×15): qty 1

## 2021-03-20 MED ORDER — ONDANSETRON HCL 4 MG PO TABS: 4.0000 mg | ORAL_TABLET | Freq: Four times a day (QID) | ORAL | Status: DC | PRN

## 2021-03-20 MED ORDER — ACETAMINOPHEN 325 MG PO TABS
650.0000 mg | ORAL_TABLET | Freq: Four times a day (QID) | ORAL | Status: DC | PRN
  Administered 2021-03-20 – 2021-03-22 (×4): 650 mg via ORAL
  Filled 2021-03-20 (×4): qty 2

## 2021-03-20 MED ORDER — SODIUM CHLORIDE 0.9 % IV SOLN
2.0000 g | Freq: Three times a day (TID) | INTRAVENOUS | Status: DC
  Administered 2021-03-20 – 2021-03-24 (×11): 2 g via INTRAVENOUS
  Filled 2021-03-20 (×11): qty 2

## 2021-03-20 MED ORDER — ALBUTEROL SULFATE (2.5 MG/3ML) 0.083% IN NEBU: 2.5000 mg | INHALATION_SOLUTION | Freq: Four times a day (QID) | RESPIRATORY_TRACT | Status: DC | PRN

## 2021-03-20 MED ORDER — CLINDAMYCIN HCL 150 MG PO CAPS: 300.0000 mg | ORAL_CAPSULE | Freq: Once | ORAL | Status: DC

## 2021-03-20 MED ORDER — SODIUM CHLORIDE 0.9% FLUSH
3.0000 mL | Freq: Two times a day (BID) | INTRAVENOUS | Status: DC
  Administered 2021-03-20 – 2021-04-04 (×9): 3 mL via INTRAVENOUS

## 2021-03-20 MED ORDER — FINASTERIDE 5 MG PO TABS
5.0000 mg | ORAL_TABLET | Freq: Every day | ORAL | Status: DC
  Administered 2021-03-21 – 2021-04-05 (×14): 5 mg via ORAL
  Filled 2021-03-20 (×15): qty 1

## 2021-03-20 MED ORDER — LISINOPRIL 40 MG PO TABS
40.0000 mg | ORAL_TABLET | Freq: Every day | ORAL | Status: DC
  Administered 2021-03-21: 40 mg via ORAL
  Filled 2021-03-20: qty 1

## 2021-03-20 MED ORDER — LINEZOLID 600 MG/300ML IV SOLN
600.0000 mg | Freq: Two times a day (BID) | INTRAVENOUS | Status: DC
  Administered 2021-03-21 – 2021-03-24 (×8): 600 mg via INTRAVENOUS
  Filled 2021-03-20 (×9): qty 300

## 2021-03-20 MED ORDER — CITALOPRAM HYDROBROMIDE 20 MG PO TABS
20.0000 mg | ORAL_TABLET | Freq: Every day | ORAL | Status: DC
  Administered 2021-03-20 – 2021-03-24 (×5): 20 mg via ORAL
  Filled 2021-03-20 (×4): qty 1
  Filled 2021-03-20: qty 2

## 2021-03-20 MED ORDER — HYDRALAZINE HCL 50 MG PO TABS
50.0000 mg | ORAL_TABLET | Freq: Two times a day (BID) | ORAL | Status: DC
  Administered 2021-03-20 – 2021-03-21 (×2): 50 mg via ORAL
  Filled 2021-03-20 (×2): qty 1

## 2021-03-20 MED ORDER — ACETAMINOPHEN 650 MG RE SUPP: 650.0000 mg | Freq: Four times a day (QID) | RECTAL | Status: DC | PRN

## 2021-03-20 MED ORDER — ROSUVASTATIN CALCIUM 5 MG PO TABS
10.0000 mg | ORAL_TABLET | Freq: Every day | ORAL | Status: DC
  Administered 2021-03-21 – 2021-04-05 (×14): 10 mg via ORAL
  Filled 2021-03-20 (×15): qty 2

## 2021-03-20 MED ORDER — AMLODIPINE BESYLATE 10 MG PO TABS
10.0000 mg | ORAL_TABLET | Freq: Every day | ORAL | Status: DC
  Administered 2021-03-21: 10 mg via ORAL
  Filled 2021-03-20: qty 1

## 2021-03-20 MED ORDER — CLOPIDOGREL BISULFATE 75 MG PO TABS
75.0000 mg | ORAL_TABLET | Freq: Every day | ORAL | Status: DC
  Administered 2021-03-20 – 2021-04-04 (×16): 75 mg via ORAL
  Filled 2021-03-20 (×15): qty 1

## 2021-03-20 MED ORDER — TAMSULOSIN HCL 0.4 MG PO CAPS
0.4000 mg | ORAL_CAPSULE | Freq: Every day | ORAL | Status: DC
  Administered 2021-03-21 – 2021-04-05 (×14): 0.4 mg via ORAL
  Filled 2021-03-20 (×14): qty 1

## 2021-03-20 MED ORDER — MORPHINE SULFATE (PF) 2 MG/ML IV SOLN
2.0000 mg | Freq: Once | INTRAVENOUS | Status: DC
  Filled 2021-03-20: qty 1

## 2021-03-20 MED ORDER — DOCUSATE SODIUM 100 MG PO CAPS
100.0000 mg | ORAL_CAPSULE | Freq: Two times a day (BID) | ORAL | Status: DC
  Administered 2021-03-20 – 2021-03-22 (×4): 100 mg via ORAL
  Filled 2021-03-20 (×4): qty 1

## 2021-03-20 MED ORDER — ENOXAPARIN SODIUM 40 MG/0.4ML IJ SOSY
40.0000 mg | PREFILLED_SYRINGE | INTRAMUSCULAR | Status: DC
  Administered 2021-03-21 – 2021-04-04 (×11): 40 mg via SUBCUTANEOUS
  Filled 2021-03-20 (×13): qty 0.4

## 2021-03-20 MED ORDER — FUROSEMIDE 20 MG PO TABS
20.0000 mg | ORAL_TABLET | Freq: Every day | ORAL | Status: DC
  Administered 2021-03-21: 20 mg via ORAL
  Filled 2021-03-20: qty 1

## 2021-03-20 NOTE — Progress Notes (Signed)
Pharmacy Antibiotic Note  Todd Dunn is a 80 y.o. male admitted on 03/20/2021 presenting with infection of R-toe, and urinary sx, chronic cath with hx resistant UTIs (recently tx for UCx growing VRE, also hx pan sens pseudomonas).  Toe imaging shows osteomyelitis, ID recs for Cx first then abx.  Pharmacy has been consulted for cefepime dosing.  Plan: Cefepime 2g IV q 8h Linezolid 600 mg IV q 12h Monitor renal function, Cx and ability to narrow or transition to PO F/u ortho recs and any possible intervention      Temp (24hrs), Avg:98.5 F (36.9 C), Min:98.4 F (36.9 C), Max:98.6 F (37 C)  Recent Labs  Lab 03/20/21 0901 03/20/21 1101  WBC 5.7  --   CREATININE 0.84  --   LATICACIDVEN 1.0 1.0    CrCl cannot be calculated (Unknown ideal weight.).    Allergies  Allergen Reactions   Aspirin Itching   Northern Quahog Clam (M. Mercenaria) Skin Test Itching   Penicillins Itching    Bertis Ruddy, PharmD Clinical Pharmacist ED Pharmacist Phone # [phone] 03/20/2021 10:17 PM

## 2021-03-20 NOTE — H&P (Addendum)
: History and Physical    Todd Dunn:[id number] DOB: 12/19/1940 DOA: 03/20/2021  Referring MD/NP/PA: Elnora Morrison, MD PCP: Fredrich Romans, [place]  Patient coming from: home  Chief Complaint: Wound of right toe  I have personally briefly reviewed patient's old medical records in [place]   HPI: Todd Dunn is a 80 y.o. male with medical history significant of SSS s/p pacemaker, CVA with residual left hemiplegia, dysphagia, and dysarthria, BPH, neurogenic bladder with chronic Foley, and recurrent UTIs presents with complaints of a wound of his right great toe.  History is somewhat limited due to patient's dysarthria.  Patient notes his right toe has been getting worse over the last couple of days.  Reports pus drainage coming from underneath the toenail and pain whenever he tries to bear weight on the affected foot.  He also complains of pain with urination.  He is unable to recall any injury to the onset symptoms.  Patient had just recently been hospitalized from 9/10-9/13 with hematuria from chronic indwelling Foley with concern for recurrent UTI with hematuria.  Patient had initially been placed on meropenem.  Urine culture noted Enterococcus faecalis and Pseudomonas aeruginosa of insignificant growth and ultimately was discharged home to complete course with ciprofloxacin.  During that hospitalization he also was noted to have a wound of his right toe, but was just ordered routine wound care.  Patient had been more recently seen in the emergency department with complaints of dysuria on 9/26.  Repeat urine cultures were obtained which revealed Staphylococcus capitis and vancomycin-resistant Enterococcus sensitive to nitrofurantoin which patient was prescribed Macrobid 100 mg twice daily for 7 days.  ED Course: Upon admission into the emergency department patient was seen to be afebrile with vital signs relatively stable.  Labs significant for hemoglobin 10.4 with MCH 24.6,  alkaline phosphatase 163, albumin 3.2, and lactic acid is negative x2.  Urinalysis was positive for large leukocytes, many bacteria, 6-10 rbcs/hpf, and greater than 50 WBCs.  X-ray of the right great toe noted mild focal osseous irregularity in the lateral tuft concerning for osteomyelitis.  Review of Systems  Unable to perform ROS: Medical condition (Patient has expressive aphasia)  Constitutional:  Negative for fever.  Cardiovascular:  Negative for leg swelling.  Gastrointestinal:  Negative for nausea and vomiting.  Genitourinary:  Positive for dysuria.  Musculoskeletal:  Positive for joint pain.   Past Medical History:  Diagnosis Date   BPH (benign prostatic hyperplasia)    CAD (coronary artery disease)    Chronic indwelling Foley catheter    CVA (cerebral vascular accident) ([place])    HTN (hypertension)    Neurogenic bladder    SSS (sick sinus syndrome) ([place])    Vitamin D deficiency     Past Surgical History:  Procedure Laterality Date   [place]       reports that he has been smoking cigarettes. He has never used smokeless tobacco. He reports that he does not currently use alcohol. He reports that he does not use drugs.  Allergies  Allergen Reactions   Aspirin Itching   [place] Clam (M. Mercenaria) Skin Test Itching   Penicillins Itching    Family History  Family history unknown: Yes    Prior to Admission medications   Medication Sig Start Date End Date Taking? Authorizing Provider  acetaminophen (TYLENOL) 325 MG tablet Take 650 mg by mouth every 4 (four) hours as needed for  mild pain.   Yes [provider]  amLODipine (NORVASC) 10 MG tablet Take 10 mg by mouth daily.   Yes [provider]  citalopram (CELEXA) 20 MG tablet Take 20 mg by mouth at bedtime.   Yes [provider]  clopidogrel (PLAVIX) 75 MG tablet Take 75 mg by mouth at bedtime.   Yes [provider]  docusate  sodium (COLACE) 100 MG capsule Take 1 capsule (100 mg total) by mouth every 12 (twelve) hours. 12/25/20  Yes [name], MD  finasteride (PROSCAR) 5 MG tablet Take 5 mg by mouth daily.   Yes [provider]  furosemide (LASIX) 20 MG tablet Take 20 mg by mouth daily.   Yes [provider]  hydrALAZINE (APRESOLINE) 50 MG tablet Take 50 mg by mouth 2 (two) times daily.   Yes [provider]  isosorbide mononitrate (IMDUR) 60 MG 24 hr tablet Take 60 mg by mouth daily.   Yes [provider]  lisinopril (ZESTRIL) 40 MG tablet Take 40 mg by mouth daily.   Yes [provider]  nitroGLYCERIN (NITROSTAT) 0.4 MG SL tablet Place 0.4 mg under the tongue every 5 (five) minutes as needed for chest pain.   Yes [provider]  rosuvastatin (CRESTOR) 10 MG tablet Take 10 mg by mouth daily.   Yes [provider]  tamsulosin (FLOMAX) 0.4 MG CAPS capsule Take 0.4 mg by mouth daily.   Yes [provider]  vitamin B-12 (CYANOCOBALAMIN) 1000 MCG tablet Take 1 tablet (1,000 mcg total) by mouth daily. 01/08/21  Yes Dede Query T, PA-C  Vitamin D, Ergocalciferol, (DRISDOL) 1.25 MG (50000 UNIT) CAPS capsule Take 50,000 Units by mouth every Monday.   Yes [provider]    Physical Exam:  Constitutional: Elderly male currently in no acute distress Vitals:   03/20/21 0847 03/20/21 1203  BP: (!) 145/57 140/62  Pulse: 67 72  Resp: 16 16  Temp: 98.6 F (37 C)   SpO2: 100% 100%   Eyes: PERRL, lids and conjunctivae normal ENMT: Mucous membranes are moist. Posterior pharynx clear of any exudate or lesions.there dentition.  Neck: normal, supple, no masses, no thyromegaly Respiratory: clear to auscultation bilaterally, no wheezing, no crackles. Normal respiratory effort. No accessory muscle use.  Cardiovascular: Regular rate and rhythm, no murmurs / rubs / gallops. No extremity edema. 1+ pedal pulses. No carotid bruits.  Abdomen: no  tenderness, no masses palpated. No hepatosplenomegaly. Bowel sounds positive.  Genitourinary: Foley catheter in place leaking urine Musculoskeletal: no clubbing / cyanosis.  Tenderness palpation of the right right great toe Skin: Toenail of the right great toe loose with purulent bloody discharge from underneath the nailbed    Neurologic: CN 2-12 grossly intact.  Patient has some left-sided hemiparesis and expressive aphasia. Psychiatric: Normal judgment and insight. Alert and oriented x 3. Normal mood.     Labs on Admission: I have personally reviewed following labs and imaging studies  CBC: Recent Labs  Lab 03/20/21 0901  WBC 5.7  NEUTROABS 3.8  HGB 10.4*  HCT 35.3*  MCV 83.6  PLT 751   Basic Metabolic Panel: Recent Labs  Lab 03/20/21 0901  NA 138  K 4.0  CL 104  CO2 29  GLUCOSE 113*  BUN 20  CREATININE 0.84  CALCIUM 9.8   GFR: CrCl cannot be calculated (Unknown ideal weight.). Liver Function Tests: Recent Labs  Lab 03/20/21 0901  AST 16  ALT 16  ALKPHOS 163*  BILITOT 0.6  PROT 6.9  ALBUMIN 3.2*   No results for input(s): LIPASE, AMYLASE in the last 168 hours. No results for input(s): AMMONIA in the last 168 hours. Coagulation Profile: No results for input(s): INR, PROTIME in the last 168 hours. Cardiac Enzymes: No results for input(s): CKTOTAL, CKMB, CKMBINDEX, TROPONINI in the last 168 hours. BNP (last 3 results) No results for input(s): PROBNP in the last 8760 hours. HbA1C: No results for input(s): HGBA1C in the last 72 hours. CBG: No results for input(s): GLUCAP in the last 168 hours. Lipid Profile: No results for input(s): CHOL, HDL, LDLCALC, TRIG, CHOLHDL, LDLDIRECT in the last 72 hours. Thyroid Function Tests: No results for input(s): TSH, T4TOTAL, FREET4, T3FREE, THYROIDAB in the last 72 hours. Anemia Panel: No results for input(s): VITAMINB12, FOLATE, FERRITIN, TIBC, IRON, RETICCTPCT in the last 72 hours. Urine analysis:    Component  Value Date/Time   COLORURINE YELLOW 03/01/2021 2355   APPEARANCEUR CLOUDY (A) 03/01/2021 2355   LABSPEC 1.014 03/01/2021 2355   PHURINE 7.0 03/01/2021 2355   GLUCOSEU NEGATIVE 03/01/2021 2355   HGBUR MODERATE (A) 03/01/2021 2355   BILIRUBINUR NEGATIVE 03/01/2021 2355   Dana 03/01/2021 2355   PROTEINUR 100 (A) 03/01/2021 2355   NITRITE NEGATIVE 03/01/2021 2355   LEUKOCYTESUR MODERATE (A) 03/01/2021 2355   Sepsis Labs: No results found for this or any previous visit (from the past 240 hour(s)).   Radiological Exams on Admission: DG Toe Great Right  Result Date: 03/20/2021 CLINICAL DATA:  Toe infection EXAM: RIGHT GREAT TOE COMPARISON:  None. FINDINGS: Mild focal osseous irregularity of the lateral tuft. No evidence of fracture or dislocation. Soft tissue swelling of the foot. Partially detached great toe toenail. IMPRESSION: Mild focal osseous irregularity of the lateral tuft, which is concerning for osteomyelitis. Finding could be further evaluated with MRI. Electronically Signed   By: Yetta Glassman M.D.   On: 03/20/2021 13:16      Assessment/Plan Wound infection possible osteomyelitis:Acute. Patient presented with concerns for infection in his right great toe.  X-ray imaging noted mild focal osseous irregularity which was concerning for osteomyelitis. -Admit to a MedSurg bed -Lower extremity wound order set utilized -Check ESR (22), CRP(1.1), and prealbumin (15.7) -Check wound culture from underneath right toe nailbed -Check ABI with TBI -Check MRI of the right great toe, if pacemaker is compatible -Held off antibiotics at this time until able to obtain wound cultures as patient does not appear to be septic -Hydrocodone as needed for pain. -Orthopedic consulted, will follow-up for further recommendations -After cultures had been obtained discussed with pharmacy given patient's recent cultures with recommendations to start linezolid and cefepime for broad coverage  given concern for osteomyelitis and recent urine cultures.  Suspected catheter associated UTI: Patient complains of continued pain with urination.  Urinalysis was positive for large leukocytes, many bacteria, small hemoglobin, and greater than 50 WBCs.  Patient has Foley catheter in place.  Most recent urine culture from 9/26 was positive for Staphylococcus capitis and vancomycin-resistant Enterococcus. -Follow-up urine culture  History of CVA with residual deficit: Patient with history of stroke with residual left-sided weakness. -Continue statin and Plavix  Hypochromic anemia: Hemoglobin 10.4 g/dL which appears near patient's baseline. -Continue to monitor H&H  Essential hypertension: Blood pressures currently stable. -Continue home regimen amlodipine, furosemide, hydralazine, isosorbide mononitrate, and lisinopril.  SSS s/p PM  Elevated alkaline phosphatase: Chronic.  Alkaline phosphatase elevated at 163, but appears that this present at least since April of this year.  Unclear cause  of symptoms. -May warrant further work-up in the outpatient setting  Neurogenic bladder with chronic indwelling Foley BPH: Foley catheter was replaced -Continue Proscar and Flomax  Hyperlipidemia -Continue Crestor  DVT prophylaxis: Lovenox Code Status: Full Disposition Plan: Likely discharge home once medically stable Consults called: Orthopedic Admission status: Inpatient require more than 2-day hospital stay  Norval Morton MD [facility]   If 7PM-7AM, please contact night-coverage   03/20/2021, 2:02 PM

## 2021-03-20 NOTE — ED Notes (Signed)
Noted pt has a indwelling foley catheter from 02/26/21. Catheter leaking at insertion site and pt wetting the brief and chuck pads while the catheter bag was empty. Catheter removed and replaced. MD notified and orders placed. Pt tolerated without difficulty and states new catheter feels better. No acute changes noted. Will continue to monitor.

## 2021-03-20 NOTE — Discharge Instructions (Addendum)
 [facility]  Discharge instructions  Lower Extremity Bypass Surgery  Please refer to the following instruction for your post-procedure care. Your surgeon or physician assistant will discuss any changes with you.  Activity  You are encouraged to walk as much as you can. You can slowly return to normal activities during the month after your surgery. Avoid strenuous activity and heavy lifting until your doctor tells you it's OK. Avoid activities such as vacuuming or swinging a golf club. Do not drive until your doctor give the OK and you are no longer taking prescription pain medications. It is also normal to have difficulty with sleep habits, eating and bowel movement after surgery. These will go away with time.  Bathing/Showering  Shower daily after you go home. Do not soak in a bathtub, hot tub, or swim until the incision heals completely.  Incision Care  Clean your incision with mild soap and water. Shower every day. Pat the area dry with a clean towel. You do not need a bandage unless otherwise instructed. Do not apply any ointments or creams to your incision. If you have open wounds you will be instructed how to care for them or a visiting nurse may be arranged for you. If you have staples or sutures along your incision they will be removed at your post-op appointment. You may have skin glue on your incision. Do not peel it off. It will come off on its own in about one week.  Wash the groin wound with soap and water daily and pat dry. (No tub bath-only shower)  Then put a dry gauze or washcloth in the groin to keep this area dry to help prevent wound infection.  Do this daily and as needed.  Do not use Vaseline or neosporin on your incisions.  Only use soap and water on your incisions and then protect and keep dry.  Diet  Resume your normal diet. There are no special food restrictions following this procedure. A low fat/ low cholesterol diet is  recommended for all patients with vascular disease. In order to heal from your surgery, it is CRITICAL to get adequate nutrition. Your body requires vitamins, minerals, and protein. Vegetables are the best source of vitamins and minerals. Vegetables also provide the perfect balance of protein. Processed food has little nutritional value, so try to avoid this.  Medications  Resume taking all your medications unless your doctor or physician assistant tells you not to. If your incision is causing pain, you may take over-the-counter pain relievers such as acetaminophen (Tylenol). If you were prescribed a stronger pain medication, please aware these medication can cause nausea and constipation. Prevent nausea by taking the medication with a snack or meal. Avoid constipation by drinking plenty of fluids and eating foods with high amount of fiber, such as fruits, vegetables, and grains. Take Colace 100 mg (an over-the-counter stool softener) twice a day as needed for constipation.  Do not take Tylenol if you are taking prescription pain medications.  Follow Up  Our office will schedule a follow up appointment 2-3 weeks following discharge.  Please call us immediately for any of the following conditions  Severe or worsening pain in your legs or feet while at rest or while walking Increase pain, redness, warmth, or drainage (pus) from your incision site(s) Fever of 101 degree or higher The swelling in your leg with the bypass suddenly worsens and becomes more painful than when you were in the hospital If you have   been instructed to feel your graft pulse then you should do so every day. If you can no longer feel this pulse, call the office immediately. Not all patients are given this instruction.  Leg swelling is common after leg bypass surgery.  The swelling should improve over a few months following surgery. To improve the swelling, you may elevate your legs above the level of your heart while you are  sitting or resting. Your surgeon or physician assistant may ask you to apply an ACE wrap or wear compression (TED) stockings to help to reduce swelling.  Reduce your risk of vascular disease  Stop smoking. If you would like help call [facility] at 1-800-QUIT-NOW (1-800-784-8669) or [place] at 336-586-4000.  Manage your cholesterol Maintain a desired weight Control your diabetes weight Control your diabetes Keep your blood pressure down  If you have any questions, please call the office at [phone]  

## 2021-03-20 NOTE — ED Triage Notes (Signed)
Pt. Is by himself and is talking with garbled speech but he states normal.

## 2021-03-20 NOTE — ED Provider Notes (Signed)
[facility] EMERGENCY DEPARTMENT Provider Note   CSN: [id number] Arrival date & time: 03/20/21  1740     History Chief Complaint  Patient presents with   Wound Check   Toe Pain   Wound Infection    Todd Dunn is a 80 y.o. male.  Patient with history of stroke, high blood pressure, sick sinus syndrome, urine infection, pacemaker placement presents with concern of infection in right great toe.  Patient states it looks bad for a few days.  Patient denies fevers chills or vomiting.  No history of similar.  No history of peripheral vascular disease.  Patient is on Plavix.  No injuries recalled.      Past Medical History:  Diagnosis Date   BPH (benign prostatic hyperplasia)    CAD (coronary artery disease)    Chronic indwelling Foley catheter    CVA (cerebral vascular accident) ([place])    HTN (hypertension)    Neurogenic bladder    SSS (sick sinus syndrome) ([place])    s/p MDT ICD   Vitamin D deficiency     Patient Active Problem List   Diagnosis Date Noted   Toe infection    PVD (peripheral vascular disease) ([place])    Osteomyelitis ([place]) 03/20/2021   Wound infection 03/20/2021   Pacemaker 03/20/2021   Elevated alkaline phosphatase level 03/20/2021   Hyperlipidemia 03/20/2021   Hematuria 02/14/2021   Acute cystitis with hematuria    Normocytic anemia 01/06/2021   Catheter-associated urinary tract infection ([place]) 81/44/8185   Acute metabolic encephalopathy 63/14/9702   Chronic indwelling Foley catheter 12/15/2020   HTN (hypertension) 12/15/2020   History of ESBL E. coli infection 12/15/2020    Past Surgical History:  Procedure Laterality Date   ABDOMINAL AORTOGRAM W/LOWER EXTREMITY N/A 03/23/2021   Procedure: ABDOMINAL AORTOGRAM W/LOWER EXTREMITY;  Surgeon: Waynetta Sandy, MD;  Location: [facility];  Service: Cardiovascular;  Laterality: N/A;   APPENDECTOMY     CARDIAC SURGERY     PACEMAKER PLACEMENT         Family  History  Family history unknown: Yes    Social History   Tobacco Use   Smoking status: Some Days    Types: Cigarettes   Smokeless tobacco: Never  Substance Use Topics   Alcohol use: Not Currently   Drug use: Never    Home Medications Prior to Admission medications   Medication Sig Start Date End Date Taking? Authorizing Provider  acetaminophen (TYLENOL) 325 MG tablet Take 650 mg by mouth every 4 (four) hours as needed for mild pain.   Yes [provider]  amLODipine (NORVASC) 10 MG tablet Take 10 mg by mouth daily.   Yes [provider]  citalopram (CELEXA) 20 MG tablet Take 20 mg by mouth at bedtime.   Yes [provider]  clopidogrel (PLAVIX) 75 MG tablet Take 75 mg by mouth at bedtime.   Yes [provider]  docusate sodium (COLACE) 100 MG capsule Take 1 capsule (100 mg total) by mouth every 12 (twelve) hours. 12/25/20  Yes [name], MD  finasteride (PROSCAR) 5 MG tablet Take 5 mg by mouth daily.   Yes [provider]  furosemide (LASIX) 20 MG tablet Take 20 mg by mouth daily.   Yes [provider]  hydrALAZINE (APRESOLINE) 50 MG tablet Take 50 mg by mouth 2 (two) times daily.   Yes [provider]  isosorbide mononitrate (IMDUR) 60 MG 24 hr tablet Take 60 mg by mouth daily.  Yes [provider]  lisinopril (ZESTRIL) 40 MG tablet Take 40 mg by mouth daily.   Yes [provider]  nitroGLYCERIN (NITROSTAT) 0.4 MG SL tablet Place 0.4 mg under the tongue every 5 (five) minutes as needed for chest pain.   Yes [provider]  rosuvastatin (CRESTOR) 10 MG tablet Take 10 mg by mouth daily.   Yes [provider]  tamsulosin (FLOMAX) 0.4 MG CAPS capsule Take 0.4 mg by mouth daily.   Yes [provider]  vitamin B-12 (CYANOCOBALAMIN) 1000 MCG tablet Take 1 tablet (1,000 mcg total) by mouth daily. 01/08/21  Yes Dede Query T, PA-C  Vitamin D, Ergocalciferol, (DRISDOL) 1.25 MG  (50000 UNIT) CAPS capsule Take 50,000 Units by mouth every Monday.   Yes [provider]    Allergies    Aspirin, Northern quahog clam (m. mercenaria) skin test, and Penicillins  Review of Systems   Review of Systems  Constitutional:  Negative for chills and fever.  HENT:  Negative for congestion.   Eyes:  Negative for visual disturbance.  Respiratory:  Negative for shortness of breath.   Cardiovascular:  Negative for chest pain.  Gastrointestinal:  Negative for abdominal pain and vomiting.  Genitourinary:  Negative for dysuria and flank pain.  Musculoskeletal:  Negative for back pain, neck pain and neck stiffness.  Skin:  Positive for wound.  Neurological:  Negative for light-headedness and headaches.   Physical Exam Updated Vital Signs BP (!) 152/67   Pulse 70   Temp 98.1 F (36.7 C) (Oral)   Resp 17   Ht 6\' 1"  (1.854 m)   Wt 62.2 kg   SpO2 100%   BMI 18.09 kg/m   Physical Exam Vitals and nursing note reviewed.  Constitutional:      General: He is not in acute distress.    Appearance: He is well-developed.  HENT:     Head: Normocephalic and atraumatic.     Mouth/Throat:     Mouth: Mucous membranes are moist.  Eyes:     General:        Right eye: No discharge.        Left eye: No discharge.     Conjunctiva/sclera: Conjunctivae normal.  Neck:     Trachea: No tracheal deviation.  Cardiovascular:     Rate and Rhythm: Normal rate.  Pulmonary:     Effort: Pulmonary effort is normal.  Abdominal:     General: There is no distension.     Palpations: Abdomen is soft.     Tenderness: There is no abdominal tenderness. There is no guarding.  Musculoskeletal:     Cervical back: Normal range of motion and neck supple. No rigidity.  Skin:    General: Skin is warm.     Capillary Refill: Capillary refill takes less than 2 seconds.     Comments: Right great toe has mild warmth and swelling medial aspect, purulence underneath right great toenail, no significant  tenderness, no streaking erythema on the foot.  Pulses intact left foot and ankle.  Neurological:     General: No focal deficit present.     Mental Status: He is alert.     Cranial Nerves: No cranial nerve deficit.     Comments: Garbled speech, baseline from stroke history.  Psychiatric:        Mood and Affect: Mood normal.    ED Results / Procedures / Treatments   Labs (all labs ordered are listed, but only abnormal results are displayed) Labs  Reviewed  URINE CULTURE - Abnormal; Notable for the following components:      Result Value   Culture 70,000 COLONIES/mL PROTEUS MIRABILIS (*)    Organism ID, Bacteria PROTEUS MIRABILIS (*)    All other components within normal limits  COMPREHENSIVE METABOLIC PANEL - Abnormal; Notable for the following components:   Glucose, Bld 113 (*)    Albumin 3.2 (*)    Alkaline Phosphatase 163 (*)    All other components within normal limits  CBC WITH DIFFERENTIAL/PLATELET - Abnormal; Notable for the following components:   Hemoglobin 10.4 (*)    HCT 35.3 (*)    MCH 24.6 (*)    MCHC 29.5 (*)    RDW 17.2 (*)    All other components within normal limits  URINALYSIS, ROUTINE W REFLEX MICROSCOPIC - Abnormal; Notable for the following components:   APPearance CLOUDY (*)    Hgb urine dipstick SMALL (*)    Leukocytes,Ua LARGE (*)    WBC, UA >50 (*)    Bacteria, UA MANY (*)    All other components within normal limits  SEDIMENTATION RATE - Abnormal; Notable for the following components:   Sed Rate 22 (*)    All other components within normal limits  C-REACTIVE PROTEIN - Abnormal; Notable for the following components:   CRP 1.1 (*)    All other components within normal limits  PREALBUMIN - Abnormal; Notable for the following components:   Prealbumin 15.7 (*)    All other components within normal limits  CBC - Abnormal; Notable for the following components:   Hemoglobin 10.7 (*)    HCT 34.2 (*)    MCH 25.1 (*)    RDW 16.8 (*)    All other  components within normal limits  COMPREHENSIVE METABOLIC PANEL - Abnormal; Notable for the following components:   Glucose, Bld 104 (*)    Total Protein 6.2 (*)    Albumin 2.8 (*)    Alkaline Phosphatase 148 (*)    All other components within normal limits  CBC - Abnormal; Notable for the following components:   RBC 3.67 (*)    Hemoglobin 9.2 (*)    HCT 29.5 (*)    MCH 25.1 (*)    RDW 16.9 (*)    All other components within normal limits  BASIC METABOLIC PANEL - Abnormal; Notable for the following components:   Glucose, Bld 110 (*)    Anion gap 4 (*)    All other components within normal limits  CBC WITH DIFFERENTIAL/PLATELET - Abnormal; Notable for the following components:   RBC 3.98 (*)    Hemoglobin 10.0 (*)    HCT 33.4 (*)    MCH 25.1 (*)    MCHC 29.9 (*)    RDW 17.2 (*)    All other components within normal limits  BASIC METABOLIC PANEL - Abnormal; Notable for the following components:   Glucose, Bld 117 (*)    All other components within normal limits  CBC WITH DIFFERENTIAL/PLATELET - Abnormal; Notable for the following components:   Hemoglobin 11.0 (*)    HCT 35.1 (*)    MCH 25.2 (*)    RDW 17.1 (*)    All other components within normal limits  BASIC METABOLIC PANEL - Abnormal; Notable for the following components:   Sodium 129 (*)    Potassium 3.4 (*)    Glucose, Bld 123 (*)    All other components within normal limits  CBC WITH DIFFERENTIAL/PLATELET - Abnormal; Notable for the following components:  Hemoglobin 10.8 (*)    HCT 35.2 (*)    MCH 24.8 (*)    RDW 17.1 (*)    All other components within normal limits  BASIC METABOLIC PANEL - Abnormal; Notable for the following components:   Glucose, Bld 113 (*)    All other components within normal limits  CULTURE, BLOOD (ROUTINE X 2)  CULTURE, BLOOD (ROUTINE X 2)  AEROBIC CULTURE W GRAM STAIN (SUPERFICIAL SPECIMEN)  SARS CORONAVIRUS 2 BY RT PCR (HOSPITAL ORDER, [facility])  MRSA  NEXT GEN BY PCR, NASAL  LACTIC ACID, PLASMA  LACTIC ACID, PLASMA  LIPID PANEL  TYPE AND SCREEN  TROPONIN I (HIGH SENSITIVITY)  TROPONIN I (HIGH SENSITIVITY)    EKG None  Radiology No results found.  Procedures Procedures   Medications Ordered in ED Medications  finasteride (PROSCAR) tablet 5 mg ( Oral Automatically Held 04/06/21 1000)  tamsulosin (FLOMAX) capsule 0.4 mg ( Oral Automatically Held 04/06/21 1000)  rosuvastatin (CRESTOR) tablet 10 mg ( Oral Automatically Held 04/06/21 1000)  isosorbide mononitrate (IMDUR) 24 hr tablet 60 mg ( Oral Automatically Held 04/06/21 1000)  clopidogrel (PLAVIX) tablet 75 mg ( Oral Automatically Held 04/05/21 2200)  enoxaparin (LOVENOX) injection 40 mg ( Subcutaneous Automatically Held 04/05/21 1600)  sodium chloride flush (NS) 0.9 % injection 3 mL ( Intravenous Automatically Held 04/05/21 2200)  acetaminophen (TYLENOL) tablet 650 mg ( Oral MAR Hold 03/26/21 8916)    Or  acetaminophen (TYLENOL) suppository 650 mg ( Rectal MAR Hold 03/26/21 0638)  ondansetron (ZOFRAN) tablet 4 mg ( Oral MAR Hold 03/26/21 9450)    Or  ondansetron (ZOFRAN) injection 4 mg ( Intravenous MAR Hold 03/26/21 3888)  albuterol (PROVENTIL) (2.5 MG/3ML) 0.083% nebulizer solution 2.5 mg ( Nebulization MAR Hold 03/26/21 2800)  morphine 2 MG/ML injection 2 mg ( Intravenous MAR Hold 03/26/21 0638)  HYDROcodone-acetaminophen (NORCO/VICODIN) 5-325 MG per tablet 1 tablet ( Oral MAR Hold 03/26/21 0638)  0.9 %  sodium chloride infusion ( Intravenous MAR Hold 03/26/21 0638)  Chlorhexidine Gluconate Cloth 2 % PADS 6 each ( Topical Automatically Held 04/06/21 1000)  0.9 %  sodium chloride infusion (has no administration in time range)  senna-docusate (Senokot-S) tablet 1 tablet ( Oral Automatically Held 04/07/21 2200)  famotidine (PEPCID) tablet 10 mg ( Oral Automatically Held 04/07/21 1000)  feeding supplement (ENSURE ENLIVE / ENSURE PLUS) liquid 237 mL ( Oral Automatically Held  04/07/21 2000)  multivitamin with minerals tablet 1 tablet ( Oral Automatically Held 04/07/21 1000)  labetalol (NORMODYNE) injection 10 mg ( Intravenous MAR Hold 03/26/21 0638)  0.9 %  sodium chloride infusion ( Intravenous Duplicate 34/91/79 1505)  sodium chloride flush (NS) 0.9 % injection 3 mL ( Intravenous Automatically Held 04/08/21 2200)  sodium chloride flush (NS) 0.9 % injection 3 mL ( Intravenous MAR Hold 03/26/21 0638)  0.9 %  sodium chloride infusion ( Intravenous MAR Hold 03/26/21 0638)  amLODipine (NORVASC) tablet 10 mg ( Oral Automatically Held 04/08/21 1000)  hydrALAZINE (APRESOLINE) tablet 50 mg ( Oral Automatically Held 04/09/21 2200)  ceFAZolin (ANCEF) IVPB 2g/100 mL premix ( Intravenous Automatically Held 04/09/21 2200)  hydrALAZINE (APRESOLINE) injection 5 mg ( Intravenous MAR Hold 03/26/21 0638)  sodium chloride 0.9 % bolus 500 mL (0 mLs Intravenous Stopped 03/21/21 1300)  nitroGLYCERIN (NITROSTAT) SL tablet 0.4 mg (0.4 mg Sublingual Given 03/22/21 1038)  morphine 2 MG/ML injection 1 mg (1 mg Intravenous Given 03/24/21 0430)  potassium chloride SA (KLOR-CON) CR tablet 40 mEq (40 mEq  Oral Given 03/25/21 1629)    ED Course  I have reviewed the triage vital signs and the nursing notes.  Pertinent labs & imaging results that were available during my care of the patient were reviewed by me and considered in my medical decision making (see chart for details).    MDM Rules/Calculators/A&P                           Patient presents with clinical concern for infection right great toe differential including cellulitis, abscess, paronychia, osteomyelitis, other.  Patient's at baseline overall, no fever, blood work ordered and reviewed normal white blood cell count, normal lactic acid.  Sed rate and x-ray added.  Patient does not want pain meds at this time.  Plan for antibiotics.  Blood work reviewed, WBC normal, 10.4 Hb, no evidence of bleeding. Lactic acid normal. Inflammatory  markers pending. Xray concerning for osteo. Hospitalist consulted for further work up and admission.  Final Clinical Impression(s) / ED Diagnoses Final diagnoses:  Toe infection  Osteomyelitis of great toe of right foot [facility])    Rx / DC Orders ED Discharge Orders     None        Elnora Morrison, MD 03/26/21 1127

## 2021-03-20 NOTE — Consult Note (Signed)
Reason for Consult:Right great toe infection Referring Physician: Fuller Plan Time called: 2876 Time at bedside: [name] is an 80 y.o. male.  HPI: Todd Dunn comes in with a 2 week hx/o left great toe infection. He denies any antecedent trauma or ulcer. The pain has steadily gotten worse but he denies known fevers, chills, sweats, N/V. He denies prior hx/o.  Past Medical History:  Diagnosis Date   BPH (benign prostatic hyperplasia)    CAD (coronary artery disease)    Chronic indwelling Foley catheter    CVA (cerebral vascular accident) ([place])    HTN (hypertension)    Neurogenic bladder    SSS (sick sinus syndrome) ([place])    Vitamin D deficiency     Past Surgical History:  Procedure Laterality Date   APPENDECTOMY     CARDIAC SURGERY     PACEMAKER PLACEMENT      Family History  Family history unknown: Yes    Social History:  reports that he has been smoking cigarettes. He has never used smokeless tobacco. He reports that he does not currently use alcohol. He reports that he does not use drugs.  Allergies:  Allergies  Allergen Reactions   Aspirin Itching   Northern Quahog Clam (M. Mercenaria) Skin Test Itching   Penicillins Itching    Medications: I have reviewed the patient's current medications.  Results for orders placed or performed during the hospital encounter of 03/20/21 (from the past 48 hour(s))  Lactic acid, plasma     Status: None   Collection Time: 03/20/21  9:01 AM  Result Value Ref Range   Lactic Acid, Venous 1.0 0.5 - 1.9 mmol/L    Comment: Performed at [facility], [address] [place], [place] [address]  Comprehensive metabolic panel     Status: Abnormal   Collection Time: 03/20/21  9:01 AM  Result Value Ref Range   Sodium 138 135 - 145 mmol/L   Potassium 4.0 3.5 - 5.1 mmol/L   Chloride 104 98 - 111 mmol/L   CO2 29 22 - 32 mmol/L   Glucose, Bld 113 (H) 70 - 99 mg/dL    Comment: Glucose reference range applies only to  samples taken after fasting for at least 8 hours.   BUN 20 8 - 23 mg/dL   Creatinine, Ser 0.84 0.61 - 1.24 mg/dL   Calcium 9.8 8.9 - 10.3 mg/dL   Total Protein 6.9 6.5 - 8.1 g/dL   Albumin 3.2 (L) 3.5 - 5.0 g/dL   AST 16 15 - 41 U/L   ALT 16 0 - 44 U/L   Alkaline Phosphatase 163 (H) 38 - 126 U/L   Total Bilirubin 0.6 0.3 - 1.2 mg/dL   GFR, Estimated >60 >60 mL/min    Comment: (NOTE) Calculated using the CKD-EPI Creatinine Equation (2021)    Anion gap 5 5 - 15    Comment: Performed at [facility] [address], [place] [address]  CBC with Differential     Status: Abnormal   Collection Time: 03/20/21  9:01 AM  Result Value Ref Range   WBC 5.7 4.0 - 10.5 K/uL   RBC 4.22 4.22 - 5.81 MIL/uL   Hemoglobin 10.4 (L) 13.0 - 17.0 g/dL   HCT 35.3 (L) 39.0 - 52.0 %   MCV 83.6 80.0 - 100.0 fL   MCH 24.6 (L) 26.0 - 34.0 pg   MCHC 29.5 (L) 30.0 - 36.0 g/dL   RDW 17.2 (H) 11.5 -  15.5 %   Platelets 229 150 - 400 K/uL   nRBC 0.0 0.0 - 0.2 %   Neutrophils Relative % 66 %   Neutro Abs 3.8 1.7 - 7.7 K/uL   Lymphocytes Relative 25 %   Lymphs Abs 1.4 0.7 - 4.0 K/uL   Monocytes Relative 7 %   Monocytes Absolute 0.4 0.1 - 1.0 K/uL   Eosinophils Relative 0 %   Eosinophils Absolute 0.0 0.0 - 0.5 K/uL   Basophils Relative 1 %   Basophils Absolute 0.0 0.0 - 0.1 K/uL   Immature Granulocytes 1 %   Abs Immature Granulocytes 0.03 0.00 - 0.07 K/uL    Comment: Performed at [facility] [address] [place], [place] [address]  Lactic acid, plasma     Status: None   Collection Time: 03/20/21 11:01 AM  Result Value Ref Range   Lactic Acid, Venous 1.0 0.5 - 1.9 mmol/L    Comment: Performed at [facility] [address], [place], [place] [address]  Sedimentation rate     Status: Abnormal   Collection Time: 03/20/21 12:50 PM  Result Value Ref Range   Sed Rate 22 (H) 0 - 16 mm/hr    Comment: Performed at [facility] [address], [place], [place] [address]     DG Toe Great Right  Result Date: 03/20/2021 CLINICAL DATA:  Toe infection EXAM: RIGHT GREAT TOE COMPARISON:  None. FINDINGS: Mild focal osseous irregularity of the lateral tuft. No evidence of fracture or dislocation. Soft tissue swelling of the foot. Partially detached great toe toenail. IMPRESSION: Mild focal osseous irregularity of the lateral tuft, which is concerning for osteomyelitis. Finding could be further evaluated with MRI. Electronically Signed   By: Yetta Glassman M.D.   On: 03/20/2021 13:16    Review of Systems  Constitutional:  Negative for chills, diaphoresis and fever.  HENT:  Negative for ear discharge, ear pain, hearing loss and tinnitus.   Eyes:  Negative for photophobia and pain.  Respiratory:  Negative for cough and shortness of breath.   Cardiovascular:  Negative for chest pain.  Gastrointestinal:  Negative for abdominal pain, nausea and vomiting.  Genitourinary:  Negative for dysuria, flank pain, frequency and urgency.  Musculoskeletal:  Positive for arthralgias (Left foot/great toe). Negative for back pain, myalgias and neck pain.  Neurological:  Negative for dizziness and headaches.  Hematological:  Does not bruise/bleed easily.  Psychiatric/Behavioral:  The patient is not nervous/anxious.   Blood pressure 140/62, pulse 72, temperature 98.6 F (37 C), resp. rate 16, SpO2 100 %. Physical Exam Constitutional:      General: He is not in acute distress.    Appearance: He is well-developed. He is not diaphoretic.  HENT:     Head: Normocephalic and atraumatic.  Eyes:     General: No scleral icterus.       Right eye: No discharge.        Left eye: No discharge.     Conjunctiva/sclera: Conjunctivae normal.  Cardiovascular:     Rate and Rhythm: Normal rate and regular rhythm.  Pulmonary:     Effort: Pulmonary effort is normal. No respiratory distress.  Musculoskeletal:     Cervical back: Normal range of motion.  Feet:     Comments: Right foot: Great  toenail loose with bloody purulent discharge from underneath, mild-mod TTP but no fusiform edema, mild pain up to midfoot, no ulcerations. SPN/DPN/TN intact, EHL 4/5, faint 1+ DP, 0 PT. Skin:  General: Skin is warm and dry.  Neurological:     Mental Status: He is alert.  Psychiatric:        Mood and Affect: Mood normal.        Behavior: Behavior normal.    Assessment/Plan: Right great toe infection -- Continue IV abx treatment. Doubt indication for debridement or amputation at present. Will check ABI's. Dr. Sharol Given to evaluate.    Lisette Abu, PA-C Orthopedic Surgery [phone] 03/20/2021, 3:37 PM

## 2021-03-20 NOTE — ED Notes (Signed)
Pt stated he has wound on his toe but not sure where it came from.

## 2021-03-20 NOTE — ED Triage Notes (Signed)
Pt. Stated, garbled speech, bad toe, it looks bad.

## 2021-03-21 ENCOUNTER — Other Ambulatory Visit: Payer: Self-pay

## 2021-03-21 ENCOUNTER — Encounter (HOSPITAL_COMMUNITY): Payer: Self-pay | Admitting: Internal Medicine

## 2021-03-21 ENCOUNTER — Inpatient Hospital Stay (HOSPITAL_COMMUNITY): Payer: Medicare Other

## 2021-03-21 DIAGNOSIS — I10 Essential (primary) hypertension: Secondary | ICD-10-CM

## 2021-03-21 DIAGNOSIS — Z7902 Long term (current) use of antithrombotics/antiplatelets: Secondary | ICD-10-CM

## 2021-03-21 DIAGNOSIS — L089 Local infection of the skin and subcutaneous tissue, unspecified: Secondary | ICD-10-CM | POA: Diagnosis not present

## 2021-03-21 DIAGNOSIS — Z79899 Other long term (current) drug therapy: Secondary | ICD-10-CM

## 2021-03-21 DIAGNOSIS — I739 Peripheral vascular disease, unspecified: Secondary | ICD-10-CM | POA: Diagnosis not present

## 2021-03-21 DIAGNOSIS — T148XXA Other injury of unspecified body region, initial encounter: Secondary | ICD-10-CM | POA: Diagnosis not present

## 2021-03-21 DIAGNOSIS — I69351 Hemiplegia and hemiparesis following cerebral infarction affecting right dominant side: Secondary | ICD-10-CM

## 2021-03-21 DIAGNOSIS — F17210 Nicotine dependence, cigarettes, uncomplicated: Secondary | ICD-10-CM

## 2021-03-21 DIAGNOSIS — I70235 Atherosclerosis of native arteries of right leg with ulceration of other part of foot: Secondary | ICD-10-CM

## 2021-03-21 DIAGNOSIS — I495 Sick sinus syndrome: Secondary | ICD-10-CM

## 2021-03-21 LAB — CBC
HCT: 34.2 % — ABNORMAL LOW (ref 39.0–52.0)
Hemoglobin: 10.7 g/dL — ABNORMAL LOW (ref 13.0–17.0)
MCH: 25.1 pg — ABNORMAL LOW (ref 26.0–34.0)
MCHC: 31.3 g/dL (ref 30.0–36.0)
MCV: 80.1 fL (ref 80.0–100.0)
Platelets: 202 10*3/uL (ref 150–400)
RBC: 4.27 MIL/uL (ref 4.22–5.81)
RDW: 16.8 % — ABNORMAL HIGH (ref 11.5–15.5)
WBC: 4.8 10*3/uL (ref 4.0–10.5)
nRBC: 0 % (ref 0.0–0.2)

## 2021-03-21 LAB — COMPREHENSIVE METABOLIC PANEL
ALT: 12 U/L (ref 0–44)
AST: 17 U/L (ref 15–41)
Albumin: 2.8 g/dL — ABNORMAL LOW (ref 3.5–5.0)
Alkaline Phosphatase: 148 U/L — ABNORMAL HIGH (ref 38–126)
Anion gap: 5 (ref 5–15)
BUN: 16 mg/dL (ref 8–23)
CO2: 26 mmol/L (ref 22–32)
Calcium: 9.5 mg/dL (ref 8.9–10.3)
Chloride: 104 mmol/L (ref 98–111)
Creatinine, Ser: 0.65 mg/dL (ref 0.61–1.24)
GFR, Estimated: >60 mL/min (ref >60)
Glucose, Bld: 104 mg/dL — ABNORMAL HIGH (ref 70–99)
Potassium: 4.1 mmol/L (ref 3.5–5.1)
Sodium: 135 mmol/L (ref 135–145)
Total Bilirubin: 0.7 mg/dL (ref 0.3–1.2)
Total Protein: 6.2 g/dL — ABNORMAL LOW (ref 6.5–8.1)

## 2021-03-21 MED ORDER — CHLORHEXIDINE GLUCONATE CLOTH 2 % EX PADS
6.0000 | MEDICATED_PAD | Freq: Every day | CUTANEOUS | Status: DC
  Administered 2021-03-21 – 2021-04-04 (×15): 6 via TOPICAL

## 2021-03-21 MED ORDER — SODIUM CHLORIDE 0.9 % IV SOLN
INTRAVENOUS | Status: DC | PRN
  Administered 2021-03-21 – 2021-03-22 (×2): 250 mL via INTRAVENOUS

## 2021-03-21 MED ORDER — HYDRALAZINE HCL 50 MG PO TABS: 50.0000 mg | ORAL_TABLET | Freq: Three times a day (TID) | ORAL | Status: DC | PRN

## 2021-03-21 MED ORDER — SODIUM CHLORIDE 0.9 % IV BOLUS
500.0000 mL | Freq: Once | INTRAVENOUS | Status: AC
  Administered 2021-03-21: 500 mL via INTRAVENOUS

## 2021-03-21 NOTE — Progress Notes (Signed)
PROGRESS NOTE    Todd Dunn  ZTA:[id number] DOB: Mar 28, 1941 DOA: 03/20/2021 PCP: Fredrich Romans, PA    Chief Complaint  Patient presents with   Wound Check   Toe Pain   Wound Infection    Brief Narrative:  Todd Dunn is a 80 y.o. male with medical history significant of SSS s/p pacemaker, CVA with residual left hemiplegia, dysphagia, and dysarthria, BPH, neurogenic bladder with chronic Foley, and recurrent UTIs presents with complaints of a wound of his right great toe  Subjective:  Dysarthric, not able to obtain reliable history Is calm, pleasant, following command, does not appear in acute pain  Assessment & Plan:   Principal Problem:   Wound infection Active Problems:   Catheter-associated urinary tract infection ([place])   Chronic indwelling Foley catheter   HTN (hypertension)   Osteomyelitis (H[facility])   Pacemaker   Elevated alkaline phosphatase level   Hyperlipidemia   Toe infection   PVD (peripheral vascular disease) ([place])  Right great toe wound infection suspect underlying osteomyelitis/PAD With elevated ESR and CRP MRI ordered, not sure if pacemaker is compatible with MRI Follow-up on wound culture result, blood culture no growth Currently on Zyvox/cefepime Ortho Dr. Sharol Given input appreciated, vascular surgery following as well, will follow recommendation  Neurogenic bladder /chronic indwelling Foley Suspect UTI Urine culture in process Continue current antibiotic, follow-up on culture result  Blood pressure low normal Hold lisinopril, norvasc, change hydralazine to prn , continue imdur with holding parameters Hold Lasix, he does not appear volume overloaded  History of CVA with residual left hemiplegia, dysarthria, dysphagia Continue statin and Plavix Baseline wheelchair-bound, lives at home Appearance soft diet  History of pacemaker, currently paced rhythm  Nutritional Assessment:  The patient's BMI is: Body mass index is 18.09  kg/m.Marland Kitchen  Seen by dietician.  I agree with the assessment and plan as outlined below:  Nutrition Status:       Unresulted Labs (From admission, onward)     Start     Ordered   03/20/21 1708  Urine Culture  Once,   R       Question:  Indication  Answer:  Dysuria   03/20/21 1707              DVT prophylaxis: enoxaparin (LOVENOX) injection 40 mg Start: 03/20/21 1600   Code Status: Full Family Communication: None at the bedside Disposition:   Status is: Inpatient   Dispo: The patient is from: Home              Anticipated d/c is to: To be determined              Anticipated d/c date is: To be determined                Consultants:  Ortho Dr. Sharol Given Vascular surgery Dr. Carlis Abbott  Procedures:   None Antimicrobials:   Anti-infectives (From admission, onward)    Start     Dose/Rate Route Frequency Ordered Stop   03/20/21 2215  linezolid (ZYVOX) IVPB 600 mg        600 mg 300 mL/hr over 60 Minutes Intravenous Every 12 hours 03/20/21 2208     03/20/21 2215  ceFEPIme (MAXIPIME) 2 g in sodium chloride 0.9 % 100 mL IVPB        2 g 200 mL/hr over 30 Minutes Intravenous Every 8 hours 03/20/21 2208     03/20/21 1315  clindamycin (CLEOCIN) capsule 300 mg  Status:  Discontinued  300 mg Oral  Once 03/20/21 1306 03/20/21 1338           Objective: Vitals:   03/21/21 0819 03/21/21 1015 03/21/21 1143 03/21/21 1149  BP: 131/77 124/61  (!) 92/45  Pulse: 78  66   Resp: 14  14   Temp: 98.2 F (36.8 C)  97.9 F (36.6 C)   TempSrc: Oral  Oral   SpO2: 100%  100%   Weight:      Height:        Intake/Output Summary (Last 24 hours) at 03/21/2021 1202 Last data filed at 03/21/2021 0451 Gross per 24 hour  Intake 100 ml  Output 550 ml  Net -450 ml   Filed Weights   03/21/21 0006  Weight: 62.2 kg    Examination:  General exam: alert, awake, dysarthria , calm, NAD, cooperative, thin build, positive indwelling Foley catheter Respiratory system: Clear to  auscultation. Respiratory effort normal. Cardiovascular system: Paced rhythm Gastrointestinal system: Abdomen is nondistended, soft and nontender.  Normal bowel sounds heard. Central nervous system: Alert , dysarthria, left hemiplegia Extremities:  no edema Skin: Right great toe wound Psychiatry: Judgement and insight appear normal. Mood & affect appropriate.     Data Reviewed: I have personally reviewed following labs and imaging studies  CBC: Recent Labs  Lab 03/20/21 0901 03/21/21 0250  WBC 5.7 4.8  NEUTROABS 3.8  --   HGB 10.4* 10.7*  HCT 35.3* 34.2*  MCV 83.6 80.1  PLT 229 939    Basic Metabolic Panel: Recent Labs  Lab 03/20/21 0901 03/21/21 0250  NA 138 135  K 4.0 4.1  CL 104 104  CO2 29 26  GLUCOSE 113* 104*  BUN 20 16  CREATININE 0.84 0.65  CALCIUM 9.8 9.5    GFR: Estimated Creatinine Clearance: 63.7 mL/min (by C-G formula based on SCr of 0.65 mg/dL).  Liver Function Tests: Recent Labs  Lab 03/20/21 0901 03/21/21 0250  AST 16 17  ALT 16 12  ALKPHOS 163* 148*  BILITOT 0.6 0.7  PROT 6.9 6.2*  ALBUMIN 3.2* 2.8*    CBG: No results for input(s): GLUCAP in the last 168 hours.   Recent Results (from the past 240 hour(s))  Culture, blood (routine x 2)     Status: None (Preliminary result)   Collection Time: 03/20/21  3:03 PM   Specimen: BLOOD RIGHT ARM  Result Value Ref Range Status   Specimen Description BLOOD RIGHT ARM  Final   Special Requests   Final    BOTTLES DRAWN AEROBIC AND ANAEROBIC Blood Culture adequate volume   Culture   Final    NO GROWTH < 24 HOURS Performed at [facility], [address] [place], [place] [address]    Report Status PENDING  Incomplete  Culture, blood (routine x 2)     Status: None (Preliminary result)   Collection Time: 03/20/21  3:26 PM   Specimen: BLOOD RIGHT FOREARM  Result Value Ref Range Status   Specimen Description BLOOD RIGHT FOREARM  Final   Special Requests   Final    BOTTLES DRAWN AEROBIC  AND ANAEROBIC Blood Culture adequate volume   Culture   Final    NO GROWTH < 24 HOURS Performed at [facility], [place] [address], [place], [place] [address]    Report Status PENDING  Incomplete  Aerobic Culture w Gram Stain (superficial specimen)     Status: None (Preliminary result)   Collection Time: 03/20/21  5:11 PM   Specimen:  Wound  Result Value Ref Range Status   Specimen Description WOUND  Final   Special Requests RIGHT TOE  Final   Gram Stain   Final    NO WBC SEEN RARE GRAM POSITIVE COCCI RARE GRAM POSITIVE RODS Performed at [facility], [address] [place],  [address]    Culture PENDING  Incomplete   Report Status PENDING  Incomplete         Radiology Studies: DG Toe Great Right  Result Date: 03/20/2021 CLINICAL DATA:  Toe infection EXAM: RIGHT GREAT TOE COMPARISON:  None. FINDINGS: Mild focal osseous irregularity of the lateral tuft. No evidence of fracture or dislocation. Soft tissue swelling of the foot. Partially detached great toe toenail. IMPRESSION: Mild focal osseous irregularity of the lateral tuft, which is concerning for osteomyelitis. Finding could be further evaluated with MRI. Electronically Signed   By: Yetta Glassman M.D.   On: 03/20/2021 13:16   VAS [place] ABI WITH/WO TBI  Result Date: 03/21/2021  LOWER EXTREMITY DOPPLER STUDY Patient Name:  Todd Dunn  Date of Exam:   03/21/2021 Medical Rec #: [id number]           Accession #:    [id number] Date of Birth: [birth year]           Patient Gender: M Patient Age:   80 years Exam Location:  [facility] Procedure:      VAS [place] ABI WITH/WO TBI Referring Phys: Fuller Plan --------------------------------------------------------------------------------  Indications: Ulceration. High Risk Factors: Hypertension, current smoker, prior CVA. Other Factors: Sick sinus syndrome, pacemaker. Residual left hemiparesis,                dysphagia, and dysarthria from prior CVA.  Performing  Technologist: Sharion Dove RVS  Examination Guidelines: A complete evaluation includes at minimum, Doppler waveform signals and systolic blood pressure reading at the level of bilateral brachial, anterior tibial, and posterior tibial arteries, when vessel segments are accessible. Bilateral testing is considered an integral part of a complete examination. Photoelectric Plethysmograph (PPG) waveforms and toe systolic pressure readings are included as required and additional duplex testing as needed. Limited examinations for reoccurring indications may be performed as noted.  ABI Findings: +---------+------------------+-----+-------------------+--------+ Right    Rt Pressure (mmHg)IndexWaveform           Comment  +---------+------------------+-----+-------------------+--------+ Brachial 143                    multiphasic                 +---------+------------------+-----+-------------------+--------+ PTA      52                0.36 dampened monophasic         +---------+------------------+-----+-------------------+--------+ DP                              absent                      +---------+------------------+-----+-------------------+--------+ Great Toe11                0.08                             +---------+------------------+-----+-------------------+--------+ +---------+------------------+-----+-----------+-------+ Left     Lt Pressure (mmHg)IndexWaveform   Comment +---------+------------------+-----+-----------+-------+ Brachial 136  multiphasic        +---------+------------------+-----+-----------+-------+ PTA      97                0.68 monophasic         +---------+------------------+-----+-----------+-------+ DP       72                0.50 monophasic         +---------+------------------+-----+-----------+-------+ Great Toe40                0.28                    +---------+------------------+-----+-----------+-------+  +-------+-----------+-----------+------------+------------+ ABI/TBIToday's ABIToday's TBIPrevious ABIPrevious TBI +-------+-----------+-----------+------------+------------+ Right  0.36       0.08                                +-------+-----------+-----------+------------+------------+ Left   0.68       0.28                                +-------+-----------+-----------+------------+------------+  Summary: Right: Resting right ankle-brachial index indicates severe right lower extremity arterial disease. The right toe-brachial index is abnormal. Left: Resting left ankle-brachial index indicates moderate left lower extremity arterial disease. The left toe-brachial index is abnormal.  *See table(s) above for measurements and observations.  Electronically signed by Monica Martinez MD on 03/21/2021 at 10:41:09 AM.    Final         Scheduled Meds:  Chlorhexidine Gluconate Cloth  6 each Topical Daily   citalopram  20 mg Oral QHS   clopidogrel  75 mg Oral QHS   docusate sodium  100 mg Oral Q12H   enoxaparin (LOVENOX) injection  40 mg Subcutaneous Q24H   finasteride  5 mg Oral Daily   furosemide  20 mg Oral Daily   isosorbide mononitrate  60 mg Oral Daily    morphine injection  2 mg Intravenous Once   rosuvastatin  10 mg Oral Daily   sodium chloride flush  3 mL Intravenous Q12H   tamsulosin  0.4 mg Oral Daily   Continuous Infusions:  sodium chloride 250 mL (03/21/21 3428)   ceFEPime (MAXIPIME) IV 2 g (03/21/21 7681)   linezolid (ZYVOX) IV 600 mg (03/21/21 1010)     LOS: 1 day   Time spent: 35 mins Greater than 50% of this time was spent in counseling, explanation of diagnosis, planning of further management, and coordination of care.   Voice Recognition Viviann Spare dictation system was used to create this note, attempts have been made to correct errors. Please contact the author with questions and/or clarifications.   Florencia Reasons, MD PhD FACP [facility]  Available  via Epic secure chat 7am-7pm for nonurgent issues Please page for urgent issues To page the attending provider between 7A-7P or the covering provider during after hours 7P-7A, please log into the web site [email or web address] and access using universal [place] password for that web site. If you do not have the password, please call the hospital operator.    03/21/2021, 12:02 PM

## 2021-03-21 NOTE — H&P (View-Only) (Signed)
Hospital Consult    Reason for Consult:  Right great toe wound Referring Physician:  Dr. Sharol Given MRN #:  [id number]  History of Present Illness: This is a 80 y.o. male with history of hypertension, sick sinus syndrome, previous CVA that vascular surgery has been consulted for right foot wound.  He appears to have a great toenail that has fallen off.  He is unclear about how long ago this happened.  States he is ambulatory with a cane.  States he does smoke about 3 cigarettes a day.  No previous vascular inventions.  He does have some left-sided weakness from previous stroke.  Past Medical History:  Diagnosis Date   BPH (benign prostatic hyperplasia)    CAD (coronary artery disease)    Chronic indwelling Foley catheter    CVA (cerebral vascular accident) ([place])    HTN (hypertension)    Neurogenic bladder    SSS (sick sinus syndrome) ([place])    Vitamin D deficiency     Past Surgical History:  Procedure Laterality Date   APPENDECTOMY     CARDIAC SURGERY     PACEMAKER PLACEMENT      Allergies  Allergen Reactions   Aspirin Itching   [place] Clam ([place]) Skin Test Itching   Penicillins Itching    Prior to Admission medications   Medication Sig Start Date End Date Taking? Authorizing Provider  acetaminophen (TYLENOL) 325 MG tablet Take 650 mg by mouth every 4 (four) hours as needed for mild pain.   Yes [provider]  amLODipine (NORVASC) 10 MG tablet Take 10 mg by mouth daily.   Yes [provider]  citalopram (CELEXA) 20 MG tablet Take 20 mg by mouth at bedtime.   Yes [provider]  clopidogrel (PLAVIX) 75 MG tablet Take 75 mg by mouth at bedtime.   Yes [provider]  docusate sodium (COLACE) 100 MG capsule Take 1 capsule (100 mg total) by mouth every 12 (twelve) hours. 12/25/20  Yes [name], MD  finasteride (PROSCAR) 5 MG tablet Take 5 mg by mouth daily.   Yes [provider]  furosemide (LASIX) 20 MG tablet  Take 20 mg by mouth daily.   Yes [provider]  hydrALAZINE (APRESOLINE) 50 MG tablet Take 50 mg by mouth 2 (two) times daily.   Yes [provider]  isosorbide mononitrate (IMDUR) 60 MG 24 hr tablet Take 60 mg by mouth daily.   Yes [provider]  lisinopril (ZESTRIL) 40 MG tablet Take 40 mg by mouth daily.   Yes [provider]  nitroGLYCERIN (NITROSTAT) 0.4 MG SL tablet Place 0.4 mg under the tongue every 5 (five) minutes as needed for chest pain.   Yes [provider]  rosuvastatin (CRESTOR) 10 MG tablet Take 10 mg by mouth daily.   Yes [provider]  tamsulosin (FLOMAX) 0.4 MG CAPS capsule Take 0.4 mg by mouth daily.   Yes [provider]  vitamin B-12 (CYANOCOBALAMIN) 1000 MCG tablet Take 1 tablet (1,000 mcg total) by mouth daily. 01/08/21  Yes Dede Query T, PA-C  Vitamin D, Ergocalciferol, (DRISDOL) 1.25 MG (50000 UNIT) CAPS capsule Take 50,000 Units by mouth every Monday.   Yes [provider]    Social History   Socioeconomic History   Marital status: Widowed    Spouse name: Not on file   Number of children: Not on file   Years of education: Not on file   Highest education level: Not on file  Occupational History   Not on file  Tobacco Use   Smoking status: Some Days    Types: Cigarettes   Smokeless tobacco: Never  Substance and Sexual Activity   Alcohol use: Not Currently   Drug use: Never   Sexual activity: Not on file  Other Topics Concern   Not on file  Social History Narrative   Not on file   Social Determinants of Health   Financial Resource Strain: Not on file  Food Insecurity: Not on file  Transportation Needs: Not on file  Physical Activity: Not on file  Stress: Not on file  Social Connections: Not on file  Intimate Partner Violence: Not on file     Family History  Family history unknown: Yes    ROS: [x]  Positive   [ ]  Negative   [ ]  All sytems reviewed and are  negative  Cardiovascular: []  chest pain/pressure []  palpitations []  SOB lying flat []  DOE []  pain in legs while walking []  pain in legs at rest []  pain in legs at night []  non-healing ulcers []  hx of DVT []  swelling in legs  Pulmonary: []  productive cough []  asthma/wheezing []  home O2  Neurologic: []  weakness in []  arms []  legs []  numbness in []  arms []  legs []  hx of CVA []  mini stroke [] difficulty speaking or slurred speech []  temporary loss of vision in one eye []  dizziness  Hematologic: []  hx of cancer []  bleeding problems []  problems with blood clotting easily  Endocrine:   []  diabetes []  thyroid disease  GI []  vomiting blood []  blood in stool  GU: []  CKD/renal failure []  HD--[]  M/W/F or []  T/T/S []  burning with urination []  blood in urine  Psychiatric: []  anxiety []  depression  Musculoskeletal: []  arthritis []  joint pain  Integumentary: []  rashes []  ulcers  Constitutional: []  fever []  chills   Physical Examination  Vitals:   03/21/21 1143 03/21/21 1149  BP:  (!) 92/45  Pulse: 66   Resp: 14   Temp: 97.9 F (36.6 C)   SpO2: 100%    Body mass index is 18.09 kg/m.  General:  NAD Gait: Not observed HENT: WNL, normocephalic Pulmonary: normal non-labored breathing, without Rales, rhonchi,  wheezing Cardiac: regular, without  Murmurs, rubs or gallops Abdomen: soft, NT/ND Vascular Exam/Pulses: Palpable femoral pulses bilaterally Right great toenail falling off No palpable pedal pulses Musculoskeletal: no muscle wasting or atrophy  Neurologic: A&O X 3; Appropriate Affect ; SENSATION: normal; MOTOR FUNCTION:  moving all extremities equally. Speech is fluent/normal     CBC    Component Value Date/Time   WBC 4.8 03/21/2021 0250   RBC 4.27 03/21/2021 0250   HGB 10.7 (L) 03/21/2021 0250   HGB 10.1 (L) 01/06/2021 1506   HCT 34.2 (L) 03/21/2021 0250   PLT 202 03/21/2021 0250   PLT 158 01/06/2021 1506   MCV 80.1 03/21/2021 0250    MCH 25.1 (L) 03/21/2021 0250   MCHC 31.3 03/21/2021 0250   RDW 16.8 (H) 03/21/2021 0250   LYMPHSABS 1.4 03/20/2021 0901   MONOABS 0.4 03/20/2021 0901   EOSABS 0.0 03/20/2021 0901   BASOSABS 0.0 03/20/2021 0901    BMET    Component Value Date/Time   NA 135 03/21/2021 0250   K 4.1 03/21/2021 0250   CL 104 03/21/2021 0250   CO2 26 03/21/2021 0250   GLUCOSE 104 (H) 03/21/2021 0250   BUN 16 03/21/2021 0250   CREATININE 0.65 03/21/2021 0250   CREATININE 0.79 01/06/2021 1506  CALCIUM 9.5 03/21/2021 0250   GFRNONAA >60 03/21/2021 0250   GFRNONAA >60 01/06/2021 1506    COAGS: Lab Results  Component Value Date   INR 1.1 02/14/2021   INR 1.1 01/21/2021   INR 1.2 09/15/2020     Non-Invasive Vascular Imaging:    ABIs today are 0.36 on the right dampened monophasic with a toe pressure of 11 and 0.68 on the left monophasic with a toe pressure of 40   ASSESSMENT/PLAN: This is a 80 y.o. male with multiple medical comorbidities that presents with wound of the right great toe where he has a great toenail that is falling off.  He has severely dampened flow on the right with an ABI 0.36 and toe pressure of 11.  This is inadequate for wound healing as I discussed with him today.  I discussed plans for transfemoral access for aortogram, lower extremity arteriogram, and possible intervention.  He does have good femoral pulses.  Likely will be early this week.  I will follow-up with him tomorrow as far as details.  Marty Heck, MD [facility] Office: [place]

## 2021-03-21 NOTE — Consult Note (Signed)
Hospital Consult    Reason for Consult:  Right great toe wound Referring Physician:  Dr. Sharol Given MRN #:  [id number]  History of Present Illness: This is a 80 y.o. male with history of hypertension, sick sinus syndrome, previous CVA that vascular surgery has been consulted for right foot wound.  He appears to have a great toenail that has fallen off.  He is unclear about how long ago this happened.  States he is ambulatory with a cane.  States he does smoke about 3 cigarettes a day.  No previous vascular inventions.  He does have some left-sided weakness from previous stroke.  Past Medical History:  Diagnosis Date   BPH (benign prostatic hyperplasia)    CAD (coronary artery disease)    Chronic indwelling Foley catheter    CVA (cerebral vascular accident) ([place])    HTN (hypertension)    Neurogenic bladder    SSS (sick sinus syndrome) ([place])    Vitamin D deficiency     Past Surgical History:  Procedure Laterality Date   APPENDECTOMY     CARDIAC SURGERY     PACEMAKER PLACEMENT      Allergies  Allergen Reactions   Aspirin Itching   [place] Clam (M. Mercenaria) Skin Test Itching   Penicillins Itching    Prior to Admission medications   Medication Sig Start Date End Date Taking? Authorizing Provider  acetaminophen (TYLENOL) 325 MG tablet Take 650 mg by mouth every 4 (four) hours as needed for mild pain.   Yes [provider]  amLODipine (NORVASC) 10 MG tablet Take 10 mg by mouth daily.   Yes [provider]  citalopram (CELEXA) 20 MG tablet Take 20 mg by mouth at bedtime.   Yes [provider]  clopidogrel (PLAVIX) 75 MG tablet Take 75 mg by mouth at bedtime.   Yes [provider]  docusate sodium (COLACE) 100 MG capsule Take 1 capsule (100 mg total) by mouth every 12 (twelve) hours. 12/25/20  Yes [name], MD  finasteride (PROSCAR) 5 MG tablet Take 5 mg by mouth daily.   Yes [provider]  furosemide (LASIX) 20 MG tablet  Take 20 mg by mouth daily.   Yes [provider]  hydrALAZINE (APRESOLINE) 50 MG tablet Take 50 mg by mouth 2 (two) times daily.   Yes [provider]  isosorbide mononitrate (IMDUR) 60 MG 24 hr tablet Take 60 mg by mouth daily.   Yes [provider]  lisinopril (ZESTRIL) 40 MG tablet Take 40 mg by mouth daily.   Yes [provider]  nitroGLYCERIN (NITROSTAT) 0.4 MG SL tablet Place 0.4 mg under the tongue every 5 (five) minutes as needed for chest pain.   Yes [provider]  rosuvastatin (CRESTOR) 10 MG tablet Take 10 mg by mouth daily.   Yes [provider]  tamsulosin (FLOMAX) 0.4 MG CAPS capsule Take 0.4 mg by mouth daily.   Yes [provider]  vitamin B-12 (CYANOCOBALAMIN) 1000 MCG tablet Take 1 tablet (1,000 mcg total) by mouth daily. 01/08/21  Yes Dede Query T, PA-C  Vitamin D, Ergocalciferol, (DRISDOL) 1.25 MG (50000 UNIT) CAPS capsule Take 50,000 Units by mouth every Monday.   Yes [provider]    Social History   Socioeconomic History   Marital status: Widowed    Spouse name: Not on file   Number of children: Not on file   Years of education: Not on file   Highest education level: Not on file  Occupational History   Not on file  Tobacco Use   Smoking status: Some Days    Types: Cigarettes   Smokeless tobacco: Never  Substance and Sexual Activity   Alcohol use: Not Currently   Drug use: Never   Sexual activity: Not on file  Other Topics Concern   Not on file  Social History Narrative   Not on file   Social Determinants of Health   Financial Resource Strain: Not on file  Food Insecurity: Not on file  Transportation Needs: Not on file  Physical Activity: Not on file  Stress: Not on file  Social Connections: Not on file  Intimate Partner Violence: Not on file     Family History  Family history unknown: Yes    ROS: [x]  Positive   [ ]  Negative   [ ]  All sytems reviewed and are  negative  Cardiovascular: []  chest pain/pressure []  palpitations []  SOB lying flat []  DOE []  pain in legs while walking []  pain in legs at rest []  pain in legs at night []  non-healing ulcers []  hx of DVT []  swelling in legs  Pulmonary: []  productive cough []  asthma/wheezing []  home O2  Neurologic: []  weakness in []  arms []  legs []  numbness in []  arms []  legs []  hx of CVA []  mini stroke [] difficulty speaking or slurred speech []  temporary loss of vision in one eye []  dizziness  Hematologic: []  hx of cancer []  bleeding problems []  problems with blood clotting easily  Endocrine:   []  diabetes []  thyroid disease  GI []  vomiting blood []  blood in stool  GU: []  CKD/renal failure []  HD--[]  M/W/F or []  T/T/S []  burning with urination []  blood in urine  Psychiatric: []  anxiety []  depression  Musculoskeletal: []  arthritis []  joint pain  Integumentary: []  rashes []  ulcers  Constitutional: []  fever []  chills   Physical Examination  Vitals:   03/21/21 1143 03/21/21 1149  BP:  (!) 92/45  Pulse: 66   Resp: 14   Temp: 97.9 F (36.6 C)   SpO2: 100%    Body mass index is 18.09 kg/m.  General:  NAD Gait: Not observed HENT: WNL, normocephalic Pulmonary: normal non-labored breathing, without Rales, rhonchi,  wheezing Cardiac: regular, without  Murmurs, rubs or gallops Abdomen: soft, NT/ND Vascular Exam/Pulses: Palpable femoral pulses bilaterally Right great toenail falling off No palpable pedal pulses Musculoskeletal: no muscle wasting or atrophy  Neurologic: A&O X 3; Appropriate Affect ; SENSATION: normal; MOTOR FUNCTION:  moving all extremities equally. Speech is fluent/normal     CBC    Component Value Date/Time   WBC 4.8 03/21/2021 0250   RBC 4.27 03/21/2021 0250   HGB 10.7 (L) 03/21/2021 0250   HGB 10.1 (L) 01/06/2021 1506   HCT 34.2 (L) 03/21/2021 0250   PLT 202 03/21/2021 0250   PLT 158 01/06/2021 1506   MCV 80.1 03/21/2021 0250    MCH 25.1 (L) 03/21/2021 0250   MCHC 31.3 03/21/2021 0250   RDW 16.8 (H) 03/21/2021 0250   LYMPHSABS 1.4 03/20/2021 0901   MONOABS 0.4 03/20/2021 0901   EOSABS 0.0 03/20/2021 0901   BASOSABS 0.0 03/20/2021 0901    BMET    Component Value Date/Time   NA 135 03/21/2021 0250   K 4.1 03/21/2021 0250   CL 104 03/21/2021 0250   CO2 26 03/21/2021 0250   GLUCOSE 104 (H) 03/21/2021 0250   BUN 16 03/21/2021 0250   CREATININE 0.65 03/21/2021 0250   CREATININE 0.79 01/06/2021 1506  CALCIUM 9.5 03/21/2021 0250   GFRNONAA >60 03/21/2021 0250   GFRNONAA >60 01/06/2021 1506    COAGS: Lab Results  Component Value Date   INR 1.1 02/14/2021   INR 1.1 01/21/2021   INR 1.2 09/15/2020     Non-Invasive Vascular Imaging:    ABIs today are 0.36 on the right dampened monophasic with a toe pressure of 11 and 0.68 on the left monophasic with a toe pressure of 40   ASSESSMENT/PLAN: This is a 80 y.o. male with multiple medical comorbidities that presents with wound of the right great toe where he has a great toenail that is falling off.  He has severely dampened flow on the right with an ABI 0.36 and toe pressure of 11.  This is inadequate for wound healing as I discussed with him today.  I discussed plans for transfemoral access for aortogram, lower extremity arteriogram, and possible intervention.  He does have good femoral pulses.  Likely will be early this week.  I will follow-up with him tomorrow as far as details.  Marty Heck, MD [facility] Office: [place]

## 2021-03-21 NOTE — Consult Note (Signed)
ORTHOPAEDIC CONSULTATION  REQUESTING PHYSICIAN: Florencia Reasons, MD  Chief Complaint: Right great toe pain.  HPI: Todd Dunn is a 80 y.o. male who presents with partial avulsion of the right great toenail.  Patient has a history of CVA coronary artery disease with a pacemaker.  Past Medical History:  Diagnosis Date   BPH (benign prostatic hyperplasia)    CAD (coronary artery disease)    Chronic indwelling Foley catheter    CVA (cerebral vascular accident) ([place])    HTN (hypertension)    Neurogenic bladder    SSS (sick sinus syndrome) ([place])    Vitamin D deficiency    Past Surgical History:  Procedure Laterality Date   APPENDECTOMY     CARDIAC SURGERY     PACEMAKER PLACEMENT     Social History   Socioeconomic History   Marital status: Widowed    Spouse name: Not on file   Number of children: Not on file   Years of education: Not on file   Highest education level: Not on file  Occupational History   Not on file  Tobacco Use   Smoking status: Some Days    Types: Cigarettes   Smokeless tobacco: Never  Substance and Sexual Activity   Alcohol use: Not Currently   Drug use: Never   Sexual activity: Not on file  Other Topics Concern   Not on file  Social History Narrative   Not on file   Social Determinants of Health   Financial Resource Strain: Not on file  Food Insecurity: Not on file  Transportation Needs: Not on file  Physical Activity: Not on file  Stress: Not on file  Social Connections: Not on file   Family History  Family history unknown: Yes   - negative except otherwise stated in the family history section Allergies  Allergen Reactions   Aspirin Itching   [place] Clam (M. Mercenaria) Skin Test Itching   Penicillins Itching   Prior to Admission medications   Medication Sig Start Date End Date Taking? Authorizing Provider  acetaminophen (TYLENOL) 325 MG tablet Take 650 mg by mouth every 4 (four) hours as needed for mild pain.   Yes  [provider]  amLODipine (NORVASC) 10 MG tablet Take 10 mg by mouth daily.   Yes [provider]  citalopram (CELEXA) 20 MG tablet Take 20 mg by mouth at bedtime.   Yes [provider]  clopidogrel (PLAVIX) 75 MG tablet Take 75 mg by mouth at bedtime.   Yes [provider]  docusate sodium (COLACE) 100 MG capsule Take 1 capsule (100 mg total) by mouth every 12 (twelve) hours. 12/25/20  Yes [name], MD  finasteride (PROSCAR) 5 MG tablet Take 5 mg by mouth daily.   Yes [provider]  furosemide (LASIX) 20 MG tablet Take 20 mg by mouth daily.   Yes [provider]  hydrALAZINE (APRESOLINE) 50 MG tablet Take 50 mg by mouth 2 (two) times daily.   Yes [provider]  isosorbide mononitrate (IMDUR) 60 MG 24 hr tablet Take 60 mg by mouth daily.   Yes [provider]  lisinopril (ZESTRIL) 40 MG tablet Take 40 mg by mouth daily.   Yes [provider]  nitroGLYCERIN (NITROSTAT) 0.4 MG SL tablet Place 0.4 mg under the tongue every 5 (five) minutes as needed for chest pain.   Yes [provider]  rosuvastatin (CRESTOR) 10 MG tablet Take 10 mg by mouth daily.   Yes  [provider]  tamsulosin (FLOMAX) 0.4 MG CAPS capsule Take 0.4 mg by mouth daily.   Yes [provider]  vitamin B-12 (CYANOCOBALAMIN) 1000 MCG tablet Take 1 tablet (1,000 mcg total) by mouth daily. 01/08/21  Yes Dede Query T, PA-C  Vitamin D, Ergocalciferol, (DRISDOL) 1.25 MG (50000 UNIT) CAPS capsule Take 50,000 Units by mouth every Monday.   Yes [provider]   DG Toe Great Right  Result Date: 03/20/2021 CLINICAL DATA:  Toe infection EXAM: RIGHT GREAT TOE COMPARISON:  None. FINDINGS: Mild focal osseous irregularity of the lateral tuft. No evidence of fracture or dislocation. Soft tissue swelling of the foot. Partially detached great toe toenail. IMPRESSION: Mild focal osseous irregularity of the lateral tuft,  which is concerning for osteomyelitis. Finding could be further evaluated with MRI. Electronically Signed   By: Yetta Glassman M.D.   On: 03/20/2021 13:16   - pertinent xrays, CT, MRI studies were reviewed and independently interpreted  Positive ROS: All other systems have been reviewed and were otherwise negative with the exception of those mentioned in the HPI and as above.  Physical Exam: General: Alert, no acute distress Psychiatric: Patient is competent for consent with normal mood and affect Lymphatic: No axillary or cervical lymphadenopathy Cardiovascular: No pedal edema Respiratory: No cyanosis, no use of accessory musculature GI: No organomegaly, abdomen is soft and non-tender    Images:  @ENCIMAGES @  Labs:  Lab Results  Component Value Date   HGBA1C 5.6 12/16/2020   ESRSEDRATE 22 (H) 03/20/2021   CRP 1.1 (H) 03/20/2021   REPTSTATUS PENDING 03/20/2021   GRAMSTAIN  03/20/2021    NO WBC SEEN RARE GRAM POSITIVE COCCI RARE GRAM POSITIVE RODS Performed at [facility], [place] [address] [place], [place] [address]    CULT PENDING 03/20/2021   LABORGA VANCOMYCIN RESISTANT ENTEROCOCCUS ISOLATED (A) 03/02/2021   LABORGA STAPHYLOCOCCUS CAPITIS (A) 03/02/2021    Lab Results  Component Value Date   ALBUMIN 2.8 (L) 03/21/2021   ALBUMIN 3.2 (L) 03/20/2021   ALBUMIN 2.9 (L) 03/02/2021   PREALBUMIN 15.7 (L) 03/20/2021     CBC EXTENDED Latest Ref Rng & Units 03/21/2021 03/20/2021 03/02/2021  WBC 4.0 - 10.5 K/uL 4.8 5.7 4.1  RBC 4.22 - 5.81 MIL/uL 4.27 4.22 3.92(L)  HGB 13.0 - 17.0 g/dL 10.7(L) 10.4(L) 9.8(L)  HCT 39.0 - 52.0 % 34.2(L) 35.3(L) 32.3(L)  PLT 150 - 400 K/uL 202 229 175  NEUTROABS 1.7 - 7.7 K/uL - 3.8 2.3  LYMPHSABS 0.7 - 4.0 K/uL - 1.4 1.4    Neurologic: Patient does not have protective sensation bilateral lower extremities.   MUSCULOSKELETAL:   Skin: Examination there is no redness no cellulitis no ulcers on the right foot.  Patient has a  partial avulsion of the great toenail.  There is no purulent drainage.  Patient has weakly palpable pulses.  Radiograph is suggestive of a possible lytic lesion in the bone however clinically patient does not have osteomyelitis.  Assessment: Assessment: Avulsion right great toenail with peripheral vascular disease.  Plan: Ankle-brachial indices are ordered.  If the ABI is deficient then would recommend vascular surgery consultation.  I will follow-up in the office for the right great toe after discharge.  Thank you for the consult and the opportunity to see Todd Dunn, [place] [phone] 9:02 AM

## 2021-03-21 NOTE — Progress Notes (Signed)
VASCULAR LAB    ABIs have been performed.  See CV proc for preliminary results.   Bain Whichard, RVT 03/21/2021, 9:18 AM

## 2021-03-22 ENCOUNTER — Inpatient Hospital Stay (HOSPITAL_COMMUNITY): Payer: Medicare Other

## 2021-03-22 DIAGNOSIS — I70235 Atherosclerosis of native arteries of right leg with ulceration of other part of foot: Secondary | ICD-10-CM | POA: Diagnosis not present

## 2021-03-22 DIAGNOSIS — T148XXA Other injury of unspecified body region, initial encounter: Secondary | ICD-10-CM | POA: Diagnosis not present

## 2021-03-22 DIAGNOSIS — L089 Local infection of the skin and subcutaneous tissue, unspecified: Secondary | ICD-10-CM | POA: Diagnosis not present

## 2021-03-22 LAB — TROPONIN I (HIGH SENSITIVITY)
Troponin I (High Sensitivity): 14 ng/L (ref <18)
Troponin I (High Sensitivity): 13 ng/L (ref <18)

## 2021-03-22 MED ORDER — SODIUM CHLORIDE 0.9 % IV SOLN: INTRAVENOUS | Status: DC

## 2021-03-22 MED ORDER — ENSURE ENLIVE PO LIQD
237.0000 mL | Freq: Three times a day (TID) | ORAL | Status: DC
  Administered 2021-03-22 – 2021-04-04 (×26): 237 mL via ORAL

## 2021-03-22 MED ORDER — FAMOTIDINE 20 MG PO TABS
10.0000 mg | ORAL_TABLET | Freq: Every day | ORAL | Status: DC
  Administered 2021-03-22 – 2021-04-05 (×13): 10 mg via ORAL
  Filled 2021-03-22 (×14): qty 1

## 2021-03-22 MED ORDER — NITROGLYCERIN 0.4 MG SL SUBL
0.4000 mg | SUBLINGUAL_TABLET | SUBLINGUAL | Status: AC | PRN
  Administered 2021-03-22: 0.4 mg via SUBLINGUAL

## 2021-03-22 MED ORDER — HYDRALAZINE HCL 50 MG PO TABS
50.0000 mg | ORAL_TABLET | Freq: Two times a day (BID) | ORAL | Status: DC
  Administered 2021-03-22: 50 mg via ORAL
  Filled 2021-03-22: qty 1

## 2021-03-22 MED ORDER — NITROGLYCERIN 0.4 MG SL SUBL
SUBLINGUAL_TABLET | SUBLINGUAL | Status: AC
  Administered 2021-03-22: 0.4 mg via SUBLINGUAL
  Filled 2021-03-22: qty 1

## 2021-03-22 MED ORDER — SENNOSIDES-DOCUSATE SODIUM 8.6-50 MG PO TABS
1.0000 | ORAL_TABLET | Freq: Two times a day (BID) | ORAL | Status: DC
  Administered 2021-03-22 – 2021-04-04 (×18): 1 via ORAL
  Filled 2021-03-22 (×20): qty 1

## 2021-03-22 MED ORDER — HYDRALAZINE HCL 25 MG PO TABS
25.0000 mg | ORAL_TABLET | Freq: Two times a day (BID) | ORAL | Status: DC
  Administered 2021-03-22 – 2021-03-23 (×3): 25 mg via ORAL
  Filled 2021-03-22 (×3): qty 1

## 2021-03-22 MED ORDER — ADULT MULTIVITAMIN W/MINERALS CH
1.0000 | ORAL_TABLET | Freq: Every day | ORAL | Status: DC
  Administered 2021-03-22 – 2021-04-05 (×12): 1 via ORAL
  Filled 2021-03-22 (×13): qty 1

## 2021-03-22 NOTE — Progress Notes (Signed)
Patient stated 4/10 chest pain. EKG done, MD Erlinda Hong paged. At 1027, BP 130/68, 1 nitro given. At 1032, BP 111/86, CP 3/10, 2nd nitro given. At 1037, BP 114/46, CP 2/10, 3rd nitro given. At 1042: BP 93/57, CP 1/10.

## 2021-03-22 NOTE — Progress Notes (Signed)
[facility]  Subjective  -no complaints   Objective 132/61 (!) 103 97.6 F (36.4 C) (Oral) 18 95%  Intake/Output Summary (Last 24 hours) at 03/22/2021 0851 Last data filed at 03/22/2021 0340 Gross per 24 hour  Intake 856.16 ml  Output 1250 ml  Net -393.84 ml    Palpable femoral pulses bilaterally Right great toe wound where toenail fell off and no palpable pedal pulses  Laboratory Lab Results: Recent Labs    03/20/21 0901 03/21/21 0250  WBC 5.7 4.8  HGB 10.4* 10.7*  HCT 35.3* 34.2*  PLT 229 202   BMET Recent Labs    03/20/21 0901 03/21/21 0250  NA 138 135  K 4.0 4.1  CL 104 104  CO2 29 26  GLUCOSE 113* 104*  BUN 20 16  CREATININE 0.84 0.65  CALCIUM 9.8 9.5    COAG Lab Results  Component Value Date   INR 1.1 02/14/2021   INR 1.1 01/21/2021   INR 1.2 09/15/2020   No results found for: PTT  Assessment/Planning:  80 year old male that vascular surgery was consulted for right toe wound where his great toenail has fallen off.  ABI severely depressed 0.36 and dampened monophasic with a toe pressure of 11.  Discussed inadequate for wound healing.  Plan aortogram with lower extremity arteriogram tomorrow with my partner Dr. Donzetta Matters in the Cath Lab.  Steps of the procedure discussed as well as risks and benefits.  Please keep n.p.o. after midnight.  Consent ordered.  Todd Dunn 03/22/2021 8:51 AM --

## 2021-03-22 NOTE — Progress Notes (Addendum)
PROGRESS NOTE    Todd Dunn  TXH:[id number] DOB: 1940-07-24 DOA: 03/20/2021 PCP: Fredrich Romans, PA    Chief Complaint  Patient presents with   Wound Check   Toe Pain   Wound Infection    Brief Narrative:  Todd Dunn is a 80 y.o. male with medical history significant of SSS s/p pacemaker, CVA with residual left hemiplegia, dysphagia, and dysarthria, neurogenic bladder with chronic Foley, and recurrent UTIs presents with complaints of a wound of his right great toe  Subjective:   Dysarthric, not able to obtain reliable history Denies pain, pleasant  Assessment & Plan:   Principal Problem:   Wound infection Active Problems:   Catheter-associated urinary tract infection (HCC)   Chronic indwelling Foley catheter   HTN (hypertension)   Osteomyelitis (HCC)   Pacemaker   Elevated alkaline phosphatase level   Hyperlipidemia   Toe infection   PVD (peripheral vascular disease) ([place])  Right great toe wound infection suspect underlying osteomyelitis/PAD With elevated ESR and CRP MRI ordered, need to check if pacemaker is  MRI compatible, message sent to cards master through in basket wound culture + staph aureus,  blood culture no growth Currently on Zyvox/cefepime Ortho Dr. Sharol Given input appreciated, vascular surgery following as well, plan for angiogram on Monday, n.p.o. after midnight  Neurogenic bladder /chronic indwelling Foley ( foley changed in the ED on 10/14) Suspect UTI Urine culture 70000 colinies proteus mirabilis, final report pending Already on antibiotics  Blood pressure low normal Hold lisinopril, norvasc,  Hold Lasix, he does not appear volume overloaded continue imdur with holding parameters, reduced hydralazine dose with holding parameters   History of CVA with residual left hemiplegia, dysarthria, dysphagia Continue statin and Plavix Baseline wheelchair-bound, lives at home Appears on  soft diet  History of pacemaker, currently paced  rhythm  H/o CAD with prior cardiac surgery, unclear detail, cxr with prior sternostomy  Chest pain x1 on 10/16 am Reports responded to sublingal nitro Troponin negative EKG does show t waves inversion on anteriolateral leads Cxr no acute findings Continue plavix, crestor, imdur, prn nitro Echocardiogram ordered   Aortic atherosclerosis   Nutritional Assessment:  The patient's BMI is: Body mass index is 18.09 kg/m.Marland Kitchen  Seen by dietician.  I agree with the assessment and plan as outlined below:  Nutrition Status: Nutrition Problem: Increased nutrient needs Etiology: wound healing Signs/Symptoms: estimated needs Interventions: Ensure Enlive (each supplement provides 350kcal and 20 grams of protein), MVI  Unresulted Labs (From admission, onward)     Start     Ordered   03/23/21 0500  CBC  Tomorrow morning,   R        03/22/21 0837   03/23/21 2023  Basic metabolic panel  Tomorrow morning,   R        03/22/21 0837              DVT prophylaxis: enoxaparin (LOVENOX) injection 40 mg Start: 03/20/21 1600   Code Status: Full Family Communication: None at the bedside Disposition:   Status is: Inpatient   Dispo: The patient is from: Home              Anticipated d/c is to: To be determined              Anticipated d/c date is: To be determined                Consultants:  Ortho Dr. Sharol Given Vascular surgery Dr. Carlis Abbott  Procedures:  Angiogram planned on  Monday  Antimicrobials:   Anti-infectives (From admission, onward)    Start     Dose/Rate Route Frequency Ordered Stop   03/20/21 2215  linezolid (ZYVOX) IVPB 600 mg        600 mg 300 mL/hr over 60 Minutes Intravenous Every 12 hours 03/20/21 2208     03/20/21 2215  ceFEPIme (MAXIPIME) 2 g in sodium chloride 0.9 % 100 mL IVPB        2 g 200 mL/hr over 30 Minutes Intravenous Every 8 hours 03/20/21 2208     03/20/21 1315  clindamycin (CLEOCIN) capsule 300 mg  Status:  Discontinued        300 mg Oral  Once 03/20/21  1306 03/20/21 1338           Objective: Vitals:   03/21/21 2343 03/22/21 0333 03/22/21 0808 03/22/21 1157  BP: (!) 113/51 (!) 113/55 132/61 (!) 118/57  Pulse: 78 (!) 103    Resp: 15 (!) 21 18   Temp: 99 F (37.2 C) 98.5 F (36.9 C) 97.6 F (36.4 C)   TempSrc: Oral Oral Oral   SpO2: 99% 93% 95%   Weight:      Height:        Intake/Output Summary (Last 24 hours) at 03/22/2021 1827 Last data filed at 03/22/2021 1527 Gross per 24 hour  Intake --  Output 12600 ml  Net -12600 ml   Filed Weights   03/21/21 0006  Weight: 62.2 kg    Examination:  General exam: alert, awake, dysarthria , pleasant, cooperative, thin build, positive indwelling Foley catheter Respiratory system: Clear to auscultation. Respiratory effort normal. Cardiovascular system: Paced rhythm Gastrointestinal system: Abdomen is nondistended, soft and nontender.  Normal bowel sounds heard. Central nervous system: Alert , dysarthria, left hemiplegia Extremities:  no edema Skin: Right great toe wound Psychiatry: Judgement and insight appear normal. Mood & affect appropriate.     Data Reviewed: I have personally reviewed following labs and imaging studies  CBC: Recent Labs  Lab 03/20/21 0901 03/21/21 0250  WBC 5.7 4.8  NEUTROABS 3.8  --   HGB 10.4* 10.7*  HCT 35.3* 34.2*  MCV 83.6 80.1  PLT 229 381    Basic Metabolic Panel: Recent Labs  Lab 03/20/21 0901 03/21/21 0250  NA 138 135  K 4.0 4.1  CL 104 104  CO2 29 26  GLUCOSE 113* 104*  BUN 20 16  CREATININE 0.84 0.65  CALCIUM 9.8 9.5    GFR: Estimated Creatinine Clearance: 63.7 mL/min (by C-G formula based on SCr of 0.65 mg/dL).  Liver Function Tests: Recent Labs  Lab 03/20/21 0901 03/21/21 0250  AST 16 17  ALT 16 12  ALKPHOS 163* 148*  BILITOT 0.6 0.7  PROT 6.9 6.2*  ALBUMIN 3.2* 2.8*    CBG: No results for input(s): GLUCAP in the last 168 hours.   Recent Results (from the past 240 hour(s))  Culture, blood (routine  x 2)     Status: None (Preliminary result)   Collection Time: 03/20/21  3:03 PM   Specimen: BLOOD RIGHT ARM  Result Value Ref Range Status   Specimen Description BLOOD RIGHT ARM  Final   Special Requests   Final    BOTTLES DRAWN AEROBIC AND ANAEROBIC Blood Culture adequate volume   Culture   Final    NO GROWTH 2 DAYS Performed at [facility], [address], [place], [place] [address]    Report Status PENDING  Incomplete  Culture, blood (routine x 2)  Status: None (Preliminary result)   Collection Time: 03/20/21  3:26 PM   Specimen: BLOOD RIGHT FOREARM  Result Value Ref Range Status   Specimen Description BLOOD RIGHT FOREARM  Final   Special Requests   Final    BOTTLES DRAWN AEROBIC AND ANAEROBIC Blood Culture adequate volume   Culture   Final    NO GROWTH 2 DAYS Performed at [facility], [address], [place], [place] [address]    Report Status PENDING  Incomplete  Aerobic Culture w Gram Stain (superficial specimen)     Status: None (Preliminary result)   Collection Time: 03/20/21  5:11 PM   Specimen: Wound  Result Value Ref Range Status   Specimen Description WOUND  Final   Special Requests RIGHT TOE  Final   Gram Stain   Final    NO WBC SEEN RARE GRAM POSITIVE COCCI RARE GRAM POSITIVE RODS    Culture   Final    ABUNDANT STAPHYLOCOCCUS AUREUS WITHIN MIXED ORGANISMS SUSCEPTIBILITIES TO FOLLOW Performed at [facility], [place] [address], [place], [place] [address]    Report Status PENDING  Incomplete  Urine Culture     Status: Abnormal (Preliminary result)   Collection Time: 03/20/21  8:14 PM   Specimen: Urine, Catheterized  Result Value Ref Range Status   Specimen Description URINE, CATHETERIZED  Final   Special Requests NONE  Final   Culture (A)  Final    70,000 COLONIES/mL PROTEUS MIRABILIS SUSCEPTIBILITIES TO FOLLOW Performed at [facility], [place] [address] [place], [place] [address]    Report Status PENDING  Incomplete          Radiology Studies: DG CHEST PORT 1 VIEW  Result Date: 03/22/2021 CLINICAL DATA:  Left-sided chest pain for 2 days EXAM: PORTABLE CHEST 1 VIEW COMPARISON:  02/08/2021 FINDINGS: Pacer/AICD device with leads at right atrium and right ventricle. Prior median sternotomy. Midline trachea. Mild cardiomegaly. Atherosclerosis in the transverse aorta. No pleural effusion or pneumothorax. Skin fold projects over the lateral right hemithorax. No congestive failure. Clear lungs. IMPRESSION: No acute cardiopulmonary disease. Aortic Atherosclerosis (ICD10-I70.0). Electronically Signed   By: Abigail Miyamoto M.D.   On: 03/22/2021 13:48   VAS [place] ABI WITH/WO TBI  Result Date: 03/21/2021  LOWER EXTREMITY DOPPLER STUDY Patient Name:  DARRYON BASTIN  Date of Exam:   03/21/2021 Medical Rec #: [id number]           Accession #:    [id number] Date of Birth: 11-02-1940           Patient Gender: M Patient Age:   28 years Exam Location:  [facility] Procedure:      VAS [place] ABI WITH/WO TBI Referring Phys: Fuller Plan --------------------------------------------------------------------------------  Indications: Ulceration. High Risk Factors: Hypertension, current smoker, prior CVA. Other Factors: Sick sinus syndrome, pacemaker. Residual left hemiparesis,                dysphagia, and dysarthria from prior CVA.  Performing Technologist: Sharion Dove RVS  Examination Guidelines: A complete evaluation includes at minimum, Doppler waveform signals and systolic blood pressure reading at the level of bilateral brachial, anterior tibial, and posterior tibial arteries, when vessel segments are accessible. Bilateral testing is considered an integral part of a complete examination. Photoelectric Plethysmograph (PPG) waveforms and toe systolic pressure readings are included as required and additional duplex testing as needed. Limited examinations for reoccurring indications may be performed as noted.  ABI Findings:  +---------+------------------+-----+-------------------+--------+ Right  Rt Pressure (mmHg)IndexWaveform           Comment  +---------+------------------+-----+-------------------+--------+ Brachial 143                    multiphasic                 +---------+------------------+-----+-------------------+--------+ PTA      52                0.36 dampened monophasic         +---------+------------------+-----+-------------------+--------+ DP                              absent                      +---------+------------------+-----+-------------------+--------+ Great Toe11                0.08                             +---------+------------------+-----+-------------------+--------+ +---------+------------------+-----+-----------+-------+ Left     Lt Pressure (mmHg)IndexWaveform   Comment +---------+------------------+-----+-----------+-------+ Brachial 136                    multiphasic        +---------+------------------+-----+-----------+-------+ PTA      97                0.68 monophasic         +---------+------------------+-----+-----------+-------+ DP       72                0.50 monophasic         +---------+------------------+-----+-----------+-------+ Great Toe40                0.28                    +---------+------------------+-----+-----------+-------+ +-------+-----------+-----------+------------+------------+ ABI/TBIToday's ABIToday's TBIPrevious ABIPrevious TBI +-------+-----------+-----------+------------+------------+ Right  0.36       0.08                                +-------+-----------+-----------+------------+------------+ Left   0.68       0.28                                +-------+-----------+-----------+------------+------------+  Summary: Right: Resting right ankle-brachial index indicates severe right lower extremity arterial disease. The right toe-brachial index is abnormal. Left: Resting left  ankle-brachial index indicates moderate left lower extremity arterial disease. The left toe-brachial index is abnormal.  *See table(s) above for measurements and observations.  Electronically signed by Monica Martinez MD on 03/21/2021 at 10:41:09 AM.    Final         Scheduled Meds:  Chlorhexidine Gluconate Cloth  6 each Topical Daily   citalopram  20 mg Oral QHS   clopidogrel  75 mg Oral QHS   enoxaparin (LOVENOX) injection  40 mg Subcutaneous Q24H   famotidine  10 mg Oral Daily   feeding supplement  237 mL Oral TID BM   finasteride  5 mg Oral Daily   hydrALAZINE  25 mg Oral BID   isosorbide mononitrate  60 mg Oral Daily    morphine injection  2 mg Intravenous Once   multivitamin with minerals  1 tablet Oral Daily  rosuvastatin  10 mg Oral Daily   senna-docusate  1 tablet Oral BID   sodium chloride flush  3 mL Intravenous Q12H   tamsulosin  0.4 mg Oral Daily   Continuous Infusions:  sodium chloride 250 mL (03/22/21 9038)   sodium chloride     ceFEPime (MAXIPIME) IV 2 g (03/22/21 1438)   linezolid (ZYVOX) IV 600 mg (03/22/21 1001)     LOS: 2 days   Time spent: 35 mins Greater than 50% of this time was spent in counseling, explanation of diagnosis, planning of further management, and coordination of care.   Voice Recognition Viviann Spare dictation system was used to create this note, attempts have been made to correct errors. Please contact the author with questions and/or clarifications.   Florencia Reasons, MD PhD FACP [facility]  Available via Epic secure chat 7am-7pm for nonurgent issues Please page for urgent issues To page the attending provider between 7A-7P or the covering provider during after hours 7P-7A, please log into the web site [email or web address] and access using universal [place] password for that web site. If you do not have the password, please call the hospital operator.    03/22/2021, 6:27 PM

## 2021-03-22 NOTE — Progress Notes (Signed)
Initial Nutrition Assessment  DOCUMENTATION CODES:   Underweight  INTERVENTION:  -Ensure Enlive po TID, each supplement provides 350 kcal and 20 grams of protein -MVI with minerals daily  NUTRITION DIAGNOSIS:   Increased nutrient needs related to wound healing as evidenced by estimated needs.  GOAL:   Patient will meet greater than or equal to 90% of their needs  MONITOR:   PO intake, Supplement acceptance, Skin, Weight trends, Labs, I & O's  REASON FOR ASSESSMENT:   Consult Wound healing  ASSESSMENT:   Pt with PMH significant for SSS s/p pacemaker, CVA w/ residual L hemiplegia, dysphagia and dysarthria, BPH, neurogenic bladder with chronic foley, and recurrent UTIs admitted with R great toe wound infection. 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  Pt unable to provide reliable history due to dysarthria.   Weight history reviewed. Pt is underweight based on BMI and likely meets criteria for malnutrition; however, unable to diagnose at this time without nutrition-focused physical exam.   No PO Intake documented.   UOP: 1229ml x24 hours I/O: -838ml since admit  Medications: pepcid, senokot-s, IV abx Labs: Recent Labs  Lab 03/20/21 0901 03/21/21 0250  NA 138 135  K 4.0 4.1  CL 104 104  CO2 29 26  BUN 20 16  CREATININE 0.84 0.65  CALCIUM 9.8 9.5  GLUCOSE 113* 104*   NUTRITION - FOCUSED PHYSICAL EXAM: Unable to perform at this time. Will attempt at follow-up.  Diet Order:   Diet Order             Diet NPO time specified Except for: Sips with Meds  Diet effective midnight           Diet Heart Room service appropriate? Yes; Fluid consistency: Thin  Diet effective now                   EDUCATION NEEDS:   No education needs have been identified at this time  Skin:  Skin Assessment: Skin Integrity Issues: Skin Integrity Issues:: Other (Comment) Other: R great toe wound  Last BM:  10/15  Height:   Ht Readings from Last 1 Encounters:   03/21/21 6\' 1"  (1.854 m)    Weight:   Wt Readings from Last 10 Encounters:  03/21/21 62.2 kg  03/02/21 61.9 kg  02/17/21 61.9 kg  02/08/21 65.8 kg  12/25/20 65.8 kg  12/23/20 62.5 kg  12/15/20 62.5 kg  11/11/20 56.7 kg  11/09/20 56.7 kg   BMI:  Body mass index is 18.09 kg/m.  Estimated Nutritional Needs:   Kcal:  1800-2000  Protein:  90-100 grams  Fluid:  >1.8L/d    Larkin Ina, MS, RD, LDN (she/her/hers) RD pager number and weekend/on-call pager number located in [place].

## 2021-03-23 ENCOUNTER — Inpatient Hospital Stay (HOSPITAL_COMMUNITY): Payer: Medicare Other

## 2021-03-23 ENCOUNTER — Encounter (HOSPITAL_COMMUNITY)
Admission: EM | Disposition: A | Discharge status: Skilled Nursing Facility | Payer: Self-pay | Source: Home / Self Care | Attending: Internal Medicine

## 2021-03-23 ENCOUNTER — Encounter (HOSPITAL_COMMUNITY): Payer: Self-pay | Admitting: Vascular Surgery

## 2021-03-23 DIAGNOSIS — L089 Local infection of the skin and subcutaneous tissue, unspecified: Secondary | ICD-10-CM | POA: Diagnosis not present

## 2021-03-23 DIAGNOSIS — R079 Chest pain, unspecified: Secondary | ICD-10-CM

## 2021-03-23 DIAGNOSIS — T148XXA Other injury of unspecified body region, initial encounter: Secondary | ICD-10-CM | POA: Diagnosis not present

## 2021-03-23 DIAGNOSIS — I70235 Atherosclerosis of native arteries of right leg with ulceration of other part of foot: Secondary | ICD-10-CM

## 2021-03-23 HISTORY — PX: ABDOMINAL AORTOGRAM W/LOWER EXTREMITY: CATH118223

## 2021-03-23 LAB — BASIC METABOLIC PANEL
Anion gap: 4 — ABNORMAL LOW (ref 5–15)
BUN: 13 mg/dL (ref 8–23)
CO2: 25 mmol/L (ref 22–32)
Calcium: 9.4 mg/dL (ref 8.9–10.3)
Chloride: 106 mmol/L (ref 98–111)
Creatinine, Ser: 0.78 mg/dL (ref 0.61–1.24)
GFR, Estimated: >60 mL/min (ref >60)
Glucose, Bld: 110 mg/dL — ABNORMAL HIGH (ref 70–99)
Potassium: 4.1 mmol/L (ref 3.5–5.1)
Sodium: 135 mmol/L (ref 135–145)

## 2021-03-23 LAB — CBC
HCT: 29.5 % — ABNORMAL LOW (ref 39.0–52.0)
Hemoglobin: 9.2 g/dL — ABNORMAL LOW (ref 13.0–17.0)
MCH: 25.1 pg — ABNORMAL LOW (ref 26.0–34.0)
MCHC: 31.2 g/dL (ref 30.0–36.0)
MCV: 80.4 fL (ref 80.0–100.0)
Platelets: 166 10*3/uL (ref 150–400)
RBC: 3.67 MIL/uL — ABNORMAL LOW (ref 4.22–5.81)
RDW: 16.9 % — ABNORMAL HIGH (ref 11.5–15.5)
WBC: 5.6 10*3/uL (ref 4.0–10.5)
nRBC: 0 % (ref 0.0–0.2)

## 2021-03-23 LAB — URINE CULTURE: Culture: 70000 — AB

## 2021-03-23 LAB — AEROBIC CULTURE W GRAM STAIN (SUPERFICIAL SPECIMEN): Gram Stain: NONE SEEN

## 2021-03-23 LAB — ECHOCARDIOGRAM COMPLETE
Area-P 1/2: 1.79 cm2
Calc EF: 36.1 %
Height: 73 in
S' Lateral: 4.4 cm
Single Plane A2C EF: 37.8 %
Single Plane A4C EF: 36.2 %
Weight: 2194.02 oz

## 2021-03-23 LAB — SARS CORONAVIRUS 2 BY RT PCR (HOSPITAL ORDER, PERFORMED IN ~~LOC~~ HOSPITAL LAB): SARS Coronavirus 2: NEGATIVE

## 2021-03-23 SURGERY — ABDOMINAL AORTOGRAM W/LOWER EXTREMITY
Anesthesia: LOCAL

## 2021-03-23 MED ORDER — IODIXANOL 320 MG/ML IV SOLN
INTRAVENOUS | Status: DC | PRN
  Administered 2021-03-23: 120 mL via INTRA_ARTERIAL

## 2021-03-23 MED ORDER — SODIUM CHLORIDE 0.9% FLUSH: 3.0000 mL | INTRAVENOUS | Status: DC | PRN

## 2021-03-23 MED ORDER — LABETALOL HCL 5 MG/ML IV SOLN
10.0000 mg | INTRAVENOUS | Status: DC | PRN
Start: 2021-03-23 — End: 2021-04-01
  Administered 2021-03-25: 10 mg via INTRAVENOUS
  Filled 2021-03-23: qty 4

## 2021-03-23 MED ORDER — HEPARIN (PORCINE) IN NACL 1000-0.9 UT/500ML-% IV SOLN
INTRAVENOUS | Status: DC | PRN
  Administered 2021-03-23 (×2): 500 mL

## 2021-03-23 MED ORDER — SODIUM CHLORIDE 0.9 % IV SOLN: 250.0000 mL | INTRAVENOUS | Status: DC | PRN

## 2021-03-23 MED ORDER — SODIUM CHLORIDE 0.9 % IV SOLN: INTRAVENOUS | Status: AC

## 2021-03-23 MED ORDER — SODIUM CHLORIDE 0.9 % IV SOLN: INTRAVENOUS | Status: DC

## 2021-03-23 MED ORDER — AMLODIPINE BESYLATE 10 MG PO TABS
10.0000 mg | ORAL_TABLET | Freq: Every day | ORAL | Status: DC
  Administered 2021-03-23 – 2021-04-05 (×11): 10 mg via ORAL
  Filled 2021-03-23 (×13): qty 1

## 2021-03-23 MED ORDER — LORAZEPAM 2 MG/ML IJ SOLN
1.0000 mg | Freq: Four times a day (QID) | INTRAMUSCULAR | Status: DC | PRN
  Administered 2021-03-23 – 2021-03-25 (×5): 1 mg via INTRAVENOUS
  Filled 2021-03-23 (×5): qty 1

## 2021-03-23 MED ORDER — SODIUM CHLORIDE 0.9% FLUSH
3.0000 mL | Freq: Two times a day (BID) | INTRAVENOUS | Status: DC
  Administered 2021-03-23 – 2021-04-04 (×17): 3 mL via INTRAVENOUS

## 2021-03-23 MED ORDER — LIDOCAINE HCL (PF) 1 % IJ SOLN
INTRAMUSCULAR | Status: DC | PRN
  Administered 2021-03-23: 15 mL via INTRADERMAL

## 2021-03-23 SURGICAL SUPPLY — 9 items
CATH ANGIO 5F PIGTAIL 65CM (CATHETERS) ×2 IMPLANT
CLOSURE MYNX CONTROL 5F (Vascular Products) ×2 IMPLANT
KIT MICROPUNCTURE NIT STIFF (SHEATH) ×2 IMPLANT
KIT PV (KITS) ×3 IMPLANT
SHEATH PINNACLE 5F 10CM (SHEATH) ×2 IMPLANT
SHEATH PROBE COVER 6X72 (BAG) ×2 IMPLANT
TRANSDUCER W/STOPCOCK (MISCELLANEOUS) ×3 IMPLANT
TRAY PV CATH (CUSTOM PROCEDURE TRAY) ×3 IMPLANT
WIRE BENTSON .035X145CM (WIRE) ×2 IMPLANT

## 2021-03-23 NOTE — Op Note (Signed)
    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 Donzetta Matters, MD Procedure Performed: 1.  Ultrasound-guided cannulation left common femoral artery 2.  Aortogram 3.  Selection of right common femoral artery and right lower extremity angiography 4.  Mynx device closure left common femoral artery  Indications: 80 year old male with right great toe ulceration.  He has decreased ABIs.  He is indicated for angiography with possible intervention.  Findings: The aorta was heavily calcified renal arteries were patent.  There was severely decreased flow given likely low ejection fraction.  Right hypogastric artery was patent left hypogastric artery was occluded.  Given patient's confusion and combativeness on the table we were only able to evaluate the right lower extremity we selected the right common femoral artery.  The SFA is occluded just after the takeoff for approximately 10 cm and reconstitutes down to the level of the above-knee popliteal artery was then occluded at the level of the knee and below.  He reconstitutes the posterior tibial artery at its takeoff it appears to run to the level of the foot.  Patient will be considered for right femoral to posterior tibial artery bypass.  Vein mapping is ordered.  Patient was combative throughout the procedure   Procedure:  The patient was identified in the holding area and taken to room 8.  The patient was then placed supine on the table and prepped and draped in the usual sterile fashion.  A time out was called.  Ultrasound was used to evaluate the left common femoral artery.  There was a little bit of calcification posteriorly.  We did find an area anteriorly that was soft and amenable for cannulation.  There is no signs 1% lidocaine cannulated micropuncture needle followed by wire and  sheath.  Bentson wires placed followed by 5 [place] sheath.  We then placed a pigtail catheter to the level of L1 performed aortogram followed by pelvic angiogram.  We elected to cross the bifurcation performed right lower extremity angiography.  This was very difficult with the patient's combativeness we were able to identify a posterior tibial artery as his runoff.  Patient will be considered for bypass.  We removed the catheter over wire we deployed a minx device.  He was combative but the procedure overall had no complications.   Contrast: 120 cc  Kostantinos Tallman C. Donzetta Matters, MD [facility] Office: [phone] Pager: [phone]

## 2021-03-23 NOTE — Progress Notes (Signed)
Patient is confused. Oriented to self only. Tried to get a consent over the phone to [name] (grand daughter). Unable to get a consent. She verbalized that she has more questions about the procedure and wants to speak to the doctor. Advised her to come tomorrow. She acknowledged.

## 2021-03-23 NOTE — Progress Notes (Signed)
PROGRESS NOTE  Todd Dunn  DOB: 1940/09/11  PCP: Fredrich Romans, [place] JQZ:[id number]  DOA: 03/20/2021  LOS: 3 days  Hospital Day: 4   Chief Complaint  Patient presents with   Wound Check   Toe Pain   Wound Infection    Brief narrative: Todd Dunn is a 80 y.o. male with PMH significant for CVA with residual left hemiplegia, dysphagia, and dysarthria, neurogenic bladder with chronic Foley, recurrent UTIs and also with SSS s/p pacemaker who lives at home with his granddaughter. Patient was brought to the ED on 10/14 with 2 weeks history of progressive left great toe infection without any antecedent trauma or ulcer.  X-ray right foot concerning for osteomyelitis of the lateral tuft.   Patient was started on cefepime and linezolid. Wound swab culture was sent.. Admitted to hospitalist service Evaluated by Dr. Sharol Given. See below for details  Subjective: Patient was seen and examined this morning.  He was back from angiogram at that time.  He was restless, agitated, swinging his arms and legs.  Nurses trying to control him with four-point restraints.  Also needed 1 dose of IV Ativan to control.  While going through the period of restlessness, he was however able to answer few questions appropriately.  I later discussed with his granddaughter on the phone.  Assessment/Plan: Right great toe wound Suspected osteomyelitis of the right foot lateral tuft -Seen by Dr. Sharol Given.  Patient clinically does not have osteomyelitis. -WBC count normal, lactate level normal.  No fever. -Wound culture grew staph aureus. -Currently patient is on IV cefepime and Zyvox. -Pending MRI. Recent Labs  Lab 03/20/21 0901 03/20/21 1101 03/21/21 0250 03/23/21 0201  WBC 5.7  --  4.8 5.6  LATICACIDVEN 1.0 1.0  --   --    Peripheral artery disease -Because of the right great toe wound, peripheral artery disease was suspected, vascular surgery consult was obtained. -10/17, patient underwent aortogram  and right lower extremity angiography.  Per report, heavily calcified aorta with patent renal arteries, severely decreased flow, occluded right SFA.  -Recommendation is to consider right femoral to posterior tibial artery bypass. -Patient is already on Plavix, statin after his CVA  History of CVA with residual left hemiplegia, dysarthria, dysphagia -Continue statin and Plavix -Baseline wheelchair-bound, lives at home  UTI related to chronic Foley History of recurrent UTI Neurogenic bladder /chronic indwelling Foley -Urine culture sent on admission grew 70,000 colonies per mL of Proteus mirabilis. -Already on antibiotics.   -Foley changed on 10/14 in the ED. -Continue Proscar and Flomax   Acute metabolic encephalopathy -Restlessness, agitation this morning. -Per granddaughter, patient tends to have these episodes when he gets UTI.  She states that his stroke was several years ago and did not leave any residual cognitive deficit or change in mental status. -Continue to monitor mental status change  Chest pain History of CAD -Reported an episode of chest pain on 10/16.  EKG showed T wave inversion in anterolateral leads. -Troponin normal. -Continue Plavix, Crestor, Imdur, as needed nitro -Pending echocardiogram. Recent Labs    03/22/21 1037 03/22/21 1357  TROPONINIHS 14 13   Essential hypertension -Home meds include amlodipine 10 mg daily, Lasix 20 mg daily, hydralazine 50 mg twice daily, Imdur 60 mg daily, lisinopril 40 mg daily. -Currently on Imdur only. Blood pressure is elevated to 160s today.   -Resume amlodipine. Continue Imdur.  Hydralazine remain sas needed.  Keep lisinopril and Lasix on hold.  Depression/anxiety -Celexa 20 mg at bedtime  History  of pacemaker -currently paced rhythm   Mobility: PT eval postprocedure Code Status:   Code Status: Full Code  Nutritional status: Body mass index is 18.09 kg/m. Nutrition Problem: Increased nutrient needs Etiology: wound  healing Signs/Symptoms: estimated needs Diet:  Diet Order             Diet regular Room service appropriate? Yes; Fluid consistency: Thin  Diet effective now                  DVT prophylaxis:  enoxaparin (LOVENOX) injection 40 mg Start: 03/20/21 1600   Antimicrobials: IV cefepime, Zyvox Fluid: Per protocol post angiogram Consultants: Vascular surgery, orthopedics Family Communication: Granddaughter on the phone  Status is: Inpatient  Remains inpatient appropriate because: Ongoing vascular work-up  Dispo: The patient is from: Home              Anticipated d/c is to: Pending PT eval postprocedure              Patient currently is not medically stable to d/c.   Difficult to place patient No     Infusions:   sodium chloride 250 mL (03/22/21 5885)   sodium chloride     sodium chloride 100 mL/hr at 03/23/21 1038   sodium chloride     sodium chloride     ceFEPime (MAXIPIME) IV 2 g (03/23/21 0615)   linezolid (ZYVOX) IV 600 mg (03/23/21 1245)    Scheduled Meds:  amLODipine  10 mg Oral Daily   Chlorhexidine Gluconate Cloth  6 each Topical Daily   citalopram  20 mg Oral QHS   clopidogrel  75 mg Oral QHS   enoxaparin (LOVENOX) injection  40 mg Subcutaneous Q24H   famotidine  10 mg Oral Daily   feeding supplement  237 mL Oral TID BM   finasteride  5 mg Oral Daily   hydrALAZINE  25 mg Oral BID   isosorbide mononitrate  60 mg Oral Daily    morphine injection  2 mg Intravenous Once   multivitamin with minerals  1 tablet Oral Daily   rosuvastatin  10 mg Oral Daily   senna-docusate  1 tablet Oral BID   sodium chloride flush  3 mL Intravenous Q12H   sodium chloride flush  3 mL Intravenous Q12H   tamsulosin  0.4 mg Oral Daily    Antimicrobials: Anti-infectives (From admission, onward)    Start     Dose/Rate Route Frequency Ordered Stop   03/20/21 2215  linezolid (ZYVOX) IVPB 600 mg        600 mg 300 mL/hr over 60 Minutes Intravenous Every 12 hours 03/20/21 2208      03/20/21 2215  ceFEPIme (MAXIPIME) 2 g in sodium chloride 0.9 % 100 mL IVPB        2 g 200 mL/hr over 30 Minutes Intravenous Every 8 hours 03/20/21 2208     03/20/21 1315  clindamycin (CLEOCIN) capsule 300 mg  Status:  Discontinued        300 mg Oral  Once 03/20/21 1306 03/20/21 1338       PRN meds: sodium chloride, sodium chloride, acetaminophen **OR** acetaminophen, albuterol, hydrALAZINE, HYDROcodone-acetaminophen, labetalol, LORazepam, ondansetron **OR** ondansetron (ZOFRAN) IV, sodium chloride flush   Objective: Vitals:   03/23/21 1100 03/23/21 1229  BP:  (!) 167/49  Pulse:    Resp: 14   Temp:    SpO2:      Intake/Output Summary (Last 24 hours) at 03/23/2021 1454 Last data filed at 03/23/2021 0500 Gross per  24 hour  Intake 999.83 ml  Output 2975 ml  Net -1975.17 ml   Filed Weights   03/21/21 0006  Weight: 62.2 kg   Weight change:  Body mass index is 18.09 kg/m.   Physical Exam: General exam: Ederly African-American male.  Restless because of confusion but not in pain or physical distress Skin: No rashes, lesions or ulcers. HEENT: Atraumatic, normocephalic, no obvious bleeding Lungs: Clear to auscultation bilaterally CVS: Regular rate and rhythm, no murmur GI/Abd soft, nontender, nondistended, bowel sound present CNS: Intermittent confusion, restlessness, agitation Psychiatry: Mood appropriate Extremities: No pedal edema, no calf tenderness, left toenail avulsion and infection  Data Review: I have personally reviewed the laboratory data and studies available.  Recent Labs  Lab 03/20/21 0901 03/21/21 0250 03/23/21 0201  WBC 5.7 4.8 5.6  NEUTROABS 3.8  --   --   HGB 10.4* 10.7* 9.2*  HCT 35.3* 34.2* 29.5*  MCV 83.6 80.1 80.4  PLT 229 202 166   Recent Labs  Lab 03/20/21 0901 03/21/21 0250 03/23/21 0201  NA 138 135 135  K 4.0 4.1 4.1  CL 104 104 106  CO2 29 26 25   GLUCOSE 113* 104* 110*  BUN 20 16 13   CREATININE 0.84 0.65 0.78  CALCIUM 9.8 9.5  9.4    F/u labs ordered Unresulted Labs (From admission, onward)     Start     Ordered   03/24/21 0500  Lipid panel  Tomorrow morning,   R        03/23/21 1027   03/24/21 0500  CBC with Differential/Platelet  Daily,   R      03/23/21 1454   03/24/21 6629  Basic metabolic panel  Daily,   R      03/23/21 1454            Signed, Terrilee Croak, MD [facility] 03/23/2021

## 2021-03-23 NOTE — Interval H&P Note (Signed)
History and Physical Interval Note:  03/23/2021 8:21 AM  Todd Dunn  has presented today for surgery, with the diagnosis of claudication.  The various methods of treatment have been discussed with the patient and family. After consideration of risks, benefits and other options for treatment, the patient has consented to  Procedure(s): ABDOMINAL AORTOGRAM W/LOWER EXTREMITY (N/A) as a surgical intervention.  The patient's history has been reviewed, patient examined, no change in status, stable for surgery.  I have reviewed the patient's chart and labs.  Questions were answered to the patient's satisfaction.     [name]

## 2021-03-23 NOTE — Progress Notes (Signed)
Attempted lower extremity vein mapping. Unable to do exam due to patient poor cooperation and retracted legs. Will try again later.   Todd Dunn Todd Dunn 03/23/2021 1:31 PM

## 2021-03-23 NOTE — Progress Notes (Signed)
I reviewed arteriogram pictures by Dr. Donzetta Matters and agree patient would need right tibial bypass for limb salvage.  Patient is agitated and in restraints.  I discussed with his granddaughter who helps make his medical decisions and is his next of kin and she states he has been having significant foot pain at home and she has seen the toe wound progress over the last week.  She states he has clearly stated he would not want to lose his leg.  I discussed option of right lower extremity tibial bypass for limb salvage versus palliative wound care and ultimately needing more proximal amputation in the future.  She would like to proceed with tibial bypass.  I discussed this would be higher risk given his age and comorbidities including risk of cardiac event, risk of anesthesia, risk of stroke, risk of limb loss even with revascularization etc.  She wishes to proceed.  We will post tomorrow for Dr. Donzetta Matters.  I have discussed with the hospitalist.  Vein mapping is ordered  Marty Heck, MD [facility] Office: [place]

## 2021-03-24 ENCOUNTER — Inpatient Hospital Stay (HOSPITAL_COMMUNITY): Payer: Medicare Other

## 2021-03-24 ENCOUNTER — Encounter (HOSPITAL_COMMUNITY)
Admission: EM | Disposition: A | Discharge status: Skilled Nursing Facility | Payer: Self-pay | Source: Home / Self Care | Attending: Internal Medicine

## 2021-03-24 ENCOUNTER — Encounter (HOSPITAL_COMMUNITY): Payer: Self-pay | Admitting: Internal Medicine

## 2021-03-24 ENCOUNTER — Encounter (HOSPITAL_COMMUNITY): Payer: Self-pay | Admitting: Certified Registered"

## 2021-03-24 ENCOUNTER — Other Ambulatory Visit (HOSPITAL_COMMUNITY): Payer: Self-pay

## 2021-03-24 DIAGNOSIS — Z01810 Encounter for preprocedural cardiovascular examination: Secondary | ICD-10-CM | POA: Diagnosis not present

## 2021-03-24 DIAGNOSIS — I2510 Atherosclerotic heart disease of native coronary artery without angina pectoris: Secondary | ICD-10-CM

## 2021-03-24 DIAGNOSIS — T148XXA Other injury of unspecified body region, initial encounter: Secondary | ICD-10-CM | POA: Diagnosis not present

## 2021-03-24 DIAGNOSIS — I739 Peripheral vascular disease, unspecified: Secondary | ICD-10-CM

## 2021-03-24 DIAGNOSIS — I70261 Atherosclerosis of native arteries of extremities with gangrene, right leg: Secondary | ICD-10-CM

## 2021-03-24 DIAGNOSIS — L089 Local infection of the skin and subcutaneous tissue, unspecified: Secondary | ICD-10-CM | POA: Diagnosis not present

## 2021-03-24 LAB — CBC WITH DIFFERENTIAL/PLATELET
Abs Immature Granulocytes: 0.03 10*3/uL (ref 0.00–0.07)
Basophils Absolute: 0 10*3/uL (ref 0.0–0.1)
Basophils Relative: 0 %
Eosinophils Absolute: 0 10*3/uL (ref 0.0–0.5)
Eosinophils Relative: 0 %
HCT: 33.4 % — ABNORMAL LOW (ref 39.0–52.0)
Hemoglobin: 10 g/dL — ABNORMAL LOW (ref 13.0–17.0)
Immature Granulocytes: 1 %
Lymphocytes Relative: 25 %
Lymphs Abs: 1.3 10*3/uL (ref 0.7–4.0)
MCH: 25.1 pg — ABNORMAL LOW (ref 26.0–34.0)
MCHC: 29.9 g/dL — ABNORMAL LOW (ref 30.0–36.0)
MCV: 83.9 fL (ref 80.0–100.0)
Monocytes Absolute: 0.7 10*3/uL (ref 0.1–1.0)
Monocytes Relative: 13 %
Neutro Abs: 3.3 10*3/uL (ref 1.7–7.7)
Neutrophils Relative %: 61 %
Platelets: 175 10*3/uL (ref 150–400)
RBC: 3.98 MIL/uL — ABNORMAL LOW (ref 4.22–5.81)
RDW: 17.2 % — ABNORMAL HIGH (ref 11.5–15.5)
WBC: 5.4 10*3/uL (ref 4.0–10.5)
nRBC: 0 % (ref 0.0–0.2)

## 2021-03-24 LAB — BASIC METABOLIC PANEL
Anion gap: 8 (ref 5–15)
BUN: 9 mg/dL (ref 8–23)
CO2: 22 mmol/L (ref 22–32)
Calcium: 9.3 mg/dL (ref 8.9–10.3)
Chloride: 107 mmol/L (ref 98–111)
Creatinine, Ser: 0.73 mg/dL (ref 0.61–1.24)
GFR, Estimated: >60 mL/min (ref >60)
Glucose, Bld: 117 mg/dL — ABNORMAL HIGH (ref 70–99)
Potassium: 4 mmol/L (ref 3.5–5.1)
Sodium: 137 mmol/L (ref 135–145)

## 2021-03-24 LAB — LIPID PANEL
Cholesterol: 129 mg/dL (ref 0–200)
HDL: 61 mg/dL (ref >40)
LDL Cholesterol: 56 mg/dL (ref 0–99)
Total CHOL/HDL Ratio: 2.1 RATIO
Triglycerides: 61 mg/dL (ref <150)
VLDL: 12 mg/dL (ref 0–40)

## 2021-03-24 SURGERY — CREATION, BYPASS, ARTERIAL, FEMORAL TO TIBIAL, USING GRAFT
Anesthesia: General | Laterality: Right

## 2021-03-24 MED ORDER — MORPHINE SULFATE (PF) 2 MG/ML IV SOLN
1.0000 mg | Freq: Once | INTRAVENOUS | Status: AC | PRN
  Administered 2021-03-24: 1 mg via INTRAVENOUS
  Filled 2021-03-24: qty 1

## 2021-03-24 MED ORDER — CEFAZOLIN SODIUM-DEXTROSE 2-4 GM/100ML-% IV SOLN
2.0000 g | Freq: Three times a day (TID) | INTRAVENOUS | Status: DC
  Administered 2021-03-24 – 2021-03-26 (×8): 2 g via INTRAVENOUS
  Filled 2021-03-24 (×9): qty 100

## 2021-03-24 MED ORDER — HYDRALAZINE HCL 50 MG PO TABS
50.0000 mg | ORAL_TABLET | Freq: Two times a day (BID) | ORAL | Status: DC
  Administered 2021-03-24 – 2021-04-05 (×21): 50 mg via ORAL
  Filled 2021-03-24 (×22): qty 1

## 2021-03-24 NOTE — Progress Notes (Signed)
PROGRESS NOTE  Todd Dunn  DOB: Jun 10, 1940  PCP: Fredrich Romans, [place] [id number]  DOA: 03/20/2021  LOS: 4 days  Hospital Day: 5   Chief Complaint  Patient presents with   Wound Check   Toe Pain   Wound Infection    Brief narrative: Todd Dunn is a 80 y.o. male with PMH significant for history of CAD/CABG, ppm for SSS, ICM, CVA with residual left hemiplegia, dysphagia, and dysarthria, neurogenic bladder with chronic Foley, recurrent UTIs and also with SSS s/p pacemaker who lives at home with his granddaughter. Patient was brought to the ED on 10/14 with 2 weeks history of progressive left great toe infection without any antecedent trauma or ulcer.  X-ray right foot concerning for osteomyelitis of the lateral tuft.   Patient was started on cefepime and linezolid. Wound swab culture was sent.. Admitted to hospitalist service Evaluated by orthopedics and vascular surgery. Angiogram showed the need of arterial bypass See below for details  Subjective: Patient was seen and examined this morning.   Lying on bed.  Not in distress.  Able to answer some questions but mostly sleepy.  Family not at bedside.   Earlier this morning, I noted that echocardiogram from yesterday showed EF of 30 to 35%.  Cardiology consultation was called prior to scheduled bypass surgery today.  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 -WBC count normal, lactate level normal.  No fever. -Wound culture grew staph aureus. -Currently patient is on IV cefepime and Zyvox. -Pending MRI of right foot. Recent Labs  Lab 03/20/21 0901 03/20/21 1101 03/21/21 0250 03/23/21 0201 03/24/21 0224  WBC 5.7  --  4.8 5.6 5.4  LATICACIDVEN 1.0 1.0  --   --   --     Peripheral artery disease -Because of the right great toe wound, peripheral artery disease was suspected, vascular surgery consult was  obtained. -10/17, patient underwent aortogram and right lower extremity angiography.  Per report, heavily calcified aorta with patent renal arteries, severely decreased flow, occluded right SFA.  -Recommendation is to consider right femoral to posterior tibial artery bypass. -Patient is already on Plavix, statin after his CVA  Ischemic cardiomyopathy with EF 30 to 35% Essential hypertension -Currently heart failure seems compensated. -Prior to admission patient was on Lasix, Imdur, lisinopril, hydralazine and amlodipine -Currently he is on Imdur, hydralazine and amlodipine. Lasix and lisinopril are on hold.   -Cardiology following.  CAD/CABG in 2014 -Currently has no chest pain.  Seen by cardiology.  Continue Plavix, Imdur, statin.  History of SSS s/p pacemaker -currently paced rhythm  History of CVA with residual left hemiplegia, dysarthria, dysphagia -Continue statin and Plavix -Baseline wheelchair-bound, lives at home  UTI related to chronic Foley History of recurrent UTI Neurogenic bladder /chronic indwelling Foley -Urine culture sent on admission grew 70,000 colonies per mL of Proteus mirabilis. -Already on antibiotics.   -Foley changed on 10/14 in the ED. -Continue Proscar and Flomax   Acute metabolic encephalopathy -Intermittent restlessness, agitation. -Per granddaughter, patient tends to have these episodes when he gets UTI.  She states that his stroke was several years ago and did not leave any residual cognitive deficit or change in mental status. -Continue to monitor mental status change  Depression/anxiety -Celexa 20 mg at bedtime   Mobility: PT eval postprocedure Code Status:   Code Status: Full Code  Nutritional status: Body mass index is 18.09 kg/m. Nutrition Problem: Increased nutrient needs  Etiology: wound healing Signs/Symptoms: estimated needs Diet:  Diet Order             Diet NPO time specified  Diet effective midnight                   DVT prophylaxis:  enoxaparin (LOVENOX) injection 40 mg Start: 03/20/21 1600   Antimicrobials: IV cefepime, Zyvox Fluid: Per protocol post angiogram Consultants: Vascular surgery, orthopedics Family Communication: Granddaughter on the phone  Status is: Inpatient  Remains inpatient appropriate because: Ongoing vascular work-up  Dispo: The patient is from: Home              Anticipated d/c is to: Pending arterial bypass today              Patient currently is not medically stable to d/c.   Difficult to place patient No     Infusions:   sodium chloride 250 mL (03/22/21 7858)   sodium chloride     sodium chloride     ceFEPime (MAXIPIME) IV 2 g (03/24/21 0151)   linezolid (ZYVOX) IV 600 mg (03/23/21 2220)    Scheduled Meds:  amLODipine  10 mg Oral Daily   Chlorhexidine Gluconate Cloth  6 each Topical Daily   citalopram  20 mg Oral QHS   clopidogrel  75 mg Oral QHS   enoxaparin (LOVENOX) injection  40 mg Subcutaneous Q24H   famotidine  10 mg Oral Daily   feeding supplement  237 mL Oral TID BM   finasteride  5 mg Oral Daily   hydrALAZINE  50 mg Oral BID   isosorbide mononitrate  60 mg Oral Daily    morphine injection  2 mg Intravenous Once   multivitamin with minerals  1 tablet Oral Daily   rosuvastatin  10 mg Oral Daily   senna-docusate  1 tablet Oral BID   sodium chloride flush  3 mL Intravenous Q12H   sodium chloride flush  3 mL Intravenous Q12H   tamsulosin  0.4 mg Oral Daily    Antimicrobials: Anti-infectives (From admission, onward)    Start     Dose/Rate Route Frequency Ordered Stop   03/20/21 2215  linezolid (ZYVOX) IVPB 600 mg        600 mg 300 mL/hr over 60 Minutes Intravenous Every 12 hours 03/20/21 2208     03/20/21 2215  ceFEPIme (MAXIPIME) 2 g in sodium chloride 0.9 % 100 mL IVPB        2 g 200 mL/hr over 30 Minutes Intravenous Every 8 hours 03/20/21 2208     03/20/21 1315  clindamycin (CLEOCIN) capsule 300 mg  Status:  Discontinued        300 mg  Oral  Once 03/20/21 1306 03/20/21 1338       PRN meds: sodium chloride, sodium chloride, acetaminophen **OR** acetaminophen, albuterol, hydrALAZINE, HYDROcodone-acetaminophen, labetalol, LORazepam, ondansetron **OR** ondansetron (ZOFRAN) IV, sodium chloride flush   Objective: Vitals:   03/24/21 0333 03/24/21 0833  BP: (!) 166/83 (!) 156/66  Pulse: 75 65  Resp:  15  Temp: 97.9 F (36.6 C) 98.3 F (36.8 C)  SpO2: 97% 99%    Intake/Output Summary (Last 24 hours) at 03/24/2021 1114 Last data filed at 03/24/2021 0500 Gross per 24 hour  Intake 1520 ml  Output 1350 ml  Net 170 ml    Filed Weights   03/21/21 0006  Weight: 62.2 kg   Weight change:  Body mass index is 18.09 kg/m.   Physical Exam: General exam: Ederly African-American  male.  Not in distress.  Sleepy  skin: No rashes, lesions or ulcers. HEENT: Atraumatic, normocephalic, no obvious bleeding Lungs: Clear to auscultation bilaterally CVS: Regular rate and rhythm, no murmur GI/Abd soft, nontender, nondistended, bowel sound present CNS: Currently sleepy.  Intermittent confusion, restlessness, agitation Psychiatry: Mood appropriate Extremities: No pedal edema, no calf tenderness, left toenail avulsion and infection  Data Review: I have personally reviewed the laboratory data and studies available.  Recent Labs  Lab 03/20/21 0901 03/21/21 0250 03/23/21 0201 03/24/21 0224  WBC 5.7 4.8 5.6 5.4  NEUTROABS 3.8  --   --  3.3  HGB 10.4* 10.7* 9.2* 10.0*  HCT 35.3* 34.2* 29.5* 33.4*  MCV 83.6 80.1 80.4 83.9  PLT 229 202 166 175    Recent Labs  Lab 03/20/21 0901 03/21/21 0250 03/23/21 0201 03/24/21 0224  NA 138 135 135 137  K 4.0 4.1 4.1 4.0  CL 104 104 106 107  CO2 29 26 25 22   GLUCOSE 113* 104* 110* 117*  BUN 20 16 13 9   CREATININE 0.84 0.65 0.78 0.73  CALCIUM 9.8 9.5 9.4 9.3     F/u labs ordered Unresulted Labs (From admission, onward)     Start     Ordered   03/24/21 0500  CBC with  Differential/Platelet  Daily,   R      03/23/21 1454   03/24/21 1443  Basic metabolic panel  Daily,   R      03/23/21 1454            Signed, Terrilee Croak, MD [facility] 03/24/2021

## 2021-03-24 NOTE — Progress Notes (Signed)
Lower extremity vein mapping has been completed.   Preliminary results in CV Proc.   Todd Dunn 03/24/2021 9:56 AM

## 2021-03-24 NOTE — Progress Notes (Signed)
Pharmacy Antibiotic Note  Todd Dunn is a 80 y.o. male admitted on 03/20/2021 presenting with infection of R-toe, and urinary sx, chronic cath with hx resistant UTIs (recently tx for UCx growing VRE, also hx pan sens pseudomonas).  Toe imaging shows osteomyelitis. Pharmacy dosing cefepime and he is also on zyvox -urine culture: proteus mirabilis (sensitive to ancef and rocephin) -Wound culture: staph aureus (pan-sensitive) -SCr= 0.73  Plan: Consider de-escalation to rocephin 2gm IV q24h Linezolid 600 mg IV q 12h (may be able to narrow) Monitor renal function and culture    Height: 6\' 1"  (185.4 cm) Weight: 62.2 kg (137 lb 2 oz) IBW/kg (Calculated) : 79.9  Temp (24hrs), Avg:97.9 F (36.6 C), Min:97.5 F (36.4 C), Max:98.3 F (36.8 C)  Recent Labs  Lab 03/20/21 0901 03/20/21 1101 03/21/21 0250 03/23/21 0201 03/24/21 0224  WBC 5.7  --  4.8 5.6 5.4  CREATININE 0.84  --  0.65 0.78 0.73  LATICACIDVEN 1.0 1.0  --   --   --      Estimated Creatinine Clearance: 63.7 mL/min (by C-G formula based on SCr of 0.73 mg/dL).    Allergies  Allergen Reactions   Aspirin Itching   [place] Clam (M. Mercen[place]) Skin Test Itching   Penicillins Itching    Hildred Laser, PharmD Clinical Pharmacist **Pharmacist phone directory can now be found on [email or web address] ([email or web address]).  Listed under [place].

## 2021-03-24 NOTE — Consult Note (Addendum)
Cardiology Consultation:   Patient ID: Todd Dunn MRN: [id number]; DOB: [birth year]  Admit date: 03/20/2021 Date of Consult: 03/24/2021  PCP:  Fredrich Romans, [facility] Providers Cardiologist:  Larae Grooms, MD   New     Patient Profile:   Todd Dunn is a 80 y.o. 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  History of Present Illness:   Todd Dunn was hospitalized in 2021 in [place]. H&P then stated: CAD s/p posterior left-ventricular artery and circumflex stent (last cath 2014 with patent PLV stent, 40% in-stent stenosis of circumflex, and additional branch-vessel disease but no culprit lesions), negative nuclear stress 03/2019, ischemic cardiomyopathy with ICD, sick sinus syndrome (s/p pacemaker). EKG Findings include: A-pacing at 69bpm. 53mm ST-elevation in V1 with 17mm ST-elevation in V2. 81mm ST-depression in II and aVF with TWI in V4-V6. 58mm ST-depression in V4-V5. ST-elevation in V1-V2, TWI and ST-depression in V4-V6 is chronic. Only the ST-depression in II, III, and aVF is new from recent EKG's.  He was hospitalized 10/2020 in [place] for urinary retention and a UTI.  No cardiac work-up.  He was admitted 10/14 with right great toe wound and pain.  He was seen by orthopedics and then by vascular surgery.  ABIs were depressed at 0.36.  An aortogram was planned.  His right SFA is occluded. It was determined that he needs right femoral to posterior tibial artery bypass.  His granddaughter participates in his care and she stated that he had been having pain and prefers to go ahead and do the bypass surgery as opposed to treating the wound with probable amputation later.  Cardiology was asked to see him preop.  Todd Dunn is awake.  He he does not answer questions.  He moans at times when  stimulated.  He cannot give me any information at all.  He is in wrist restraints.  No family is present, information was obtained from chart notes and staff.    Past Medical History:  Diagnosis Date   BPH (benign prostatic hyperplasia)    CAD (coronary artery disease)    Chronic indwelling Foley catheter    CVA (cerebral vascular accident) ([place])    HTN (hypertension)    Neurogenic bladder    SSS (sick sinus syndrome) ([place])    s/p MDT ICD   Vitamin D deficiency     Past Surgical History:  Procedure Laterality Date   ABDOMINAL AORTOGRAM W/LOWER EXTREMITY N/A 03/23/2021   Procedure: ABDOMINAL AORTOGRAM W/LOWER EXTREMITY;  Surgeon: Waynetta Sandy, MD;  Location: [facility];  Service: Cardiovascular;  Laterality: N/A;   APPENDECTOMY     CARDIAC SURGERY     PACEMAKER PLACEMENT       Home Medications:  Prior to Admission medications   Medication Sig Start Date End Date Taking? Authorizing Provider  acetaminophen (TYLENOL) 325 MG tablet Take 650 mg by mouth every 4 (four) hours as needed for mild pain.   Yes [provider]  amLODipine (NORVASC) 10 MG tablet Take 10 mg by mouth daily.   Yes [provider]  citalopram (CELEXA) 20 MG tablet Take 20 mg by mouth at bedtime.   Yes [provider]  clopidogrel (PLAVIX) 75 MG tablet Take 75 mg by mouth at bedtime.   Yes [provider]  docusate sodium (  COLACE) 100 MG capsule Take 1 capsule (100 mg total) by mouth every 12 (twelve) hours. 12/25/20  Yes [name], MD  finasteride (PROSCAR) 5 MG tablet Take 5 mg by mouth daily.   Yes [provider]  furosemide (LASIX) 20 MG tablet Take 20 mg by mouth daily.   Yes [provider]  hydrALAZINE (APRESOLINE) 50 MG tablet Take 50 mg by mouth 2 (two) times daily.   Yes [provider]  isosorbide mononitrate (IMDUR) 60 MG 24 hr tablet Take 60 mg by mouth daily.   Yes [provider]  lisinopril  (ZESTRIL) 40 MG tablet Take 40 mg by mouth daily.   Yes [provider]  nitroGLYCERIN (NITROSTAT) 0.4 MG SL tablet Place 0.4 mg under the tongue every 5 (five) minutes as needed for chest pain.   Yes [provider]  rosuvastatin (CRESTOR) 10 MG tablet Take 10 mg by mouth daily.   Yes [provider]  tamsulosin (FLOMAX) 0.4 MG CAPS capsule Take 0.4 mg by mouth daily.   Yes [provider]  vitamin B-12 (CYANOCOBALAMIN) 1000 MCG tablet Take 1 tablet (1,000 mcg total) by mouth daily. 01/08/21  Yes Dede Query T, PA-C  Vitamin D, Ergocalciferol, (DRISDOL) 1.25 MG (50000 UNIT) CAPS capsule Take 50,000 Units by mouth every Monday.   Yes [provider]    Inpatient Medications: Scheduled Meds:  amLODipine  10 mg Oral Daily   Chlorhexidine Gluconate Cloth  6 each Topical Daily   citalopram  20 mg Oral QHS   clopidogrel  75 mg Oral QHS   enoxaparin (LOVENOX) injection  40 mg Subcutaneous Q24H   famotidine  10 mg Oral Daily   feeding supplement  237 mL Oral TID BM   finasteride  5 mg Oral Daily   hydrALAZINE  25 mg Oral BID   isosorbide mononitrate  60 mg Oral Daily    morphine injection  2 mg Intravenous Once   multivitamin with minerals  1 tablet Oral Daily   rosuvastatin  10 mg Oral Daily   senna-docusate  1 tablet Oral BID   sodium chloride flush  3 mL Intravenous Q12H   sodium chloride flush  3 mL Intravenous Q12H   tamsulosin  0.4 mg Oral Daily   Continuous Infusions:  sodium chloride 250 mL (03/22/21 5462)   sodium chloride     sodium chloride     ceFEPime (MAXIPIME) IV 2 g (03/24/21 0151)   linezolid (ZYVOX) IV 600 mg (03/23/21 2220)   PRN Meds: sodium chloride, sodium chloride, acetaminophen **OR** acetaminophen, albuterol, hydrALAZINE, HYDROcodone-acetaminophen, labetalol, LORazepam, ondansetron **OR** ondansetron (ZOFRAN) IV, sodium chloride flush  Allergies:    Allergies  Allergen Reactions   Aspirin Itching   [place] Clam (M[place]en[place]) Skin Test Itching   Penicillins Itching    Social History:   Social History   Socioeconomic History   Marital status: Widowed    Spouse name: Not on file   Number of children: Not on file   Years of education: Not on file   Highest education level: Not on file  Occupational History   Not on file  Tobacco Use   Smoking status: Some Days    Types: Cigarettes   Smokeless tobacco: Never  Substance and Sexual Activity   Alcohol use: Not Currently   Drug use: Never   Sexual activity: Not on file  Other Topics Concern   Not on file  Social History Narrative   Not on file  Social Determinants of Health   Financial Resource Strain: Not on file  Food Insecurity: Not on file  Transportation Needs: Not on file  Physical Activity: Not on file  Stress: Not on file  Social Connections: Not on file  Intimate Partner Violence: Not on file    Family History:    Family History  Family history unknown: Yes     ROS:  Please see the history of present illness.  No other information obtainable due to patient condition  Physical Exam/Data:   Vitals:   03/24/21 0040 03/24/21 0300 03/24/21 0333 03/24/21 0833  BP:   (!) 166/83 (!) 156/66  Pulse: 78 78 75 65  Resp:    15  Temp: 98.2 F (36.8 C)  97.9 F (36.6 C) 98.3 F (36.8 C)  TempSrc: Axillary  Axillary Oral  SpO2: 98%  97% 99%  Weight:      Height:        Intake/Output Summary (Last 24 hours) at 03/24/2021 0916 Last data filed at 03/24/2021 0500 Gross per 24 hour  Intake 1520 ml  Output 1350 ml  Net 170 ml   Last 3 Weights 03/21/2021 03/02/2021 02/17/2021  Weight (lbs) 137 lb 2 oz 136 lb 7.4 oz 136 lb 7.4 oz  Weight (kg) 62.2 kg 61.9 kg 61.9 kg     Body mass index is 18.09 kg/m.  General: Frail, elderly male, in no acute distress HEENT: normal for age with poor dentition Neck:  JVD elevated but patient is not at 30 degrees Vascular: No carotid bruits; radial pulses 2+ bilaterally,  unable to palpate pedal pulses Cardiac:  normal S1, S2; RRR; no murmur  Lungs:  clear to auscultation bilaterally, poor inspiratory effort, so decreased breath sounds bases, no wheezing, rhonchi or rales  Abd: soft, nontender, no hepatomegaly  Ext: no edema Musculoskeletal:  No deformities, BUE and BLE strength normal and equal Skin: warm and dry  Neuro:  CNs 2-12 intact, no focal abnormalities noted Psych:  Normal affect   EKG:  The EKG was personally reviewed and demonstrates: Atrial paced rhythm with atrial bigeminy, heart rate 68 Telemetry:  Telemetry was personally reviewed and demonstrates: Atrial pacing with atrial bigeminy at times  Relevant CV Studies:  ECHO: 03/23/2021  1. Left ventricular ejection fraction, by estimation, is 30 to 35%. The  left ventricle has moderately decreased function. The left ventricle  demonstrates global hypokinesis. The left ventricular internal cavity size  was mildly dilated. Left ventricular diastolic parameters are consistent with Grade I diastolic dysfunction (impaired relaxation).   2. Right ventricular systolic function is normal. The right ventricular  size is normal. There is mildly elevated pulmonary artery systolic  pressure. The estimated right ventricular systolic pressure is 76.8 mmHg.   3. Left atrial size was moderately dilated.   4. Right atrial size was moderately dilated.   5. The mitral valve is normal in structure. Trivial mitral valve  regurgitation. No evidence of mitral stenosis.   6. Tricuspid valve regurgitation is moderate.   7. The aortic valve is tricuspid. Aortic valve regurgitation is not  visualized. Mild to moderate aortic valve sclerosis/calcification is  present, without any evidence of aortic stenosis.   8. The inferior vena cava is dilated in size with >50% respiratory  variability, suggesting right atrial pressure of 8 mmHg.   Lower extremity aortogram: 03/23/2021 Findings: The aorta was heavily calcified  renal arteries were patent.  There was severely decreased flow given likely low ejection fraction.  Right hypogastric artery  was patent left hypogastric artery was occluded.  Given patient's confusion and combativeness on the table we were only able to evaluate the right lower extremity we selected the right common femoral artery.  The SFA is occluded just after the takeoff for approximately 10 cm and reconstitutes down to the level of the above-knee popliteal artery was then occluded at the level of the knee and below.  He reconstitutes the posterior tibial artery at its takeoff it appears to run to the level of the foot.  Patient will be considered for right femoral to posterior tibial artery bypass.  Vein mapping is ordered.   Patient was combative throughout the procedure  ABIs: 03/21/2021 +-------+-----------+-----------+------------+------------+  ABI/TBIToday's ABIToday's TBIPrevious ABIPrevious TBI  +-------+-----------+-----------+------------+------------+  Right  0.36       0.08                                 +-------+-----------+-----------+------------+------------+  Left   0.68       0.28                                 +-------+-----------+-----------+------------+------------+   Summary:  Right: Resting right ankle-brachial index indicates severe right lower  extremity arterial disease. The right toe-brachial index is abnormal.   Left: Resting left ankle-brachial index indicates moderate left lower  extremity arterial disease. The left toe-brachial index is abnormal.   ECHO: 12/11/2019 SUMMARY  Mild LV systolic dysfunction, EF 67-67%, global.  Diastolic dysfunction, stage 1.  RV device lead.  No significant valvular disease.  Left atrial enlargement.  No prior.  FINDINGS:  LEFT VENTRICLE  The left ventricular size is normal. There is normal left ventricular  wall thickness. Left ventricular systolic function is mildly reduced.  LV ejection fraction =  40-45%. Left ventricular filling pattern is  prolonged relaxation. There is mild global hypokinesis of the left  ventricle.  RIGHT VENTRICLE  The right ventricle is normal in size and function. Device lead in the  right ventricle.  LEFT ATRIUM  The left atrium is mildly dilated.  RIGHT ATRIUM  Right atrial size is normal.  AORTIC VALVE  The aortic valve is normal in structure and function. There is no  aortic regurgitation.  MITRAL VALVE  The mitral valve is normal in structure and function. There is no  mitral regurgitation noted.  TRICUSPID VALVE  The tricuspid valve is normal in structure and function. There is  trace tricuspid regurgitation.  PULMONIC VALVE  Structurally normal pulmonic valve. There is no pulmonic valvular  regurgitation.  ARTERIES  The aortic sinus is normal size.  VENOUS  Pulmonary venous flow pattern is normal. IVC size was normal.  EFFUSION  There is no pericardial effusion.   MMode/2D Measurements & Calculations  IVSd: 0.87 cm     LA dim: 3.4 cm       IVS/LVPW: 0.69 ESV(MOD-sp4):  LVIDd: 4.0 cm     EDV(MOD-sp4):        FS: 5.5 %      48.9 ml  LVPWd: 1.3 cm     112.0 ml             EDV(Teich):  LVIDs: 3.8 cm                          71.1 ml  ESV(Teich):                                         62.1 ml    Laboratory Data:  High Sensitivity Troponin:   Recent Labs  Lab 03/22/21 1037 03/22/21 1357  TROPONINIHS 14 13     Chemistry Recent Labs  Lab 03/21/21 0250 03/23/21 0201 03/24/21 0224  NA 135 135 137  K 4.1 4.1 4.0  CL 104 106 107  CO2 26 25 22   GLUCOSE 104* 110* 117*  BUN 16 13 9   CREATININE 0.65 0.78 0.73  CALCIUM 9.5 9.4 9.3  GFRNONAA >60 >60 >60  ANIONGAP 5 4* 8    Recent Labs  Lab 03/20/21 0901 03/21/21 0250  PROT 6.9 6.2*  ALBUMIN 3.2* 2.8*  AST 16 17  ALT 16 12  ALKPHOS 163* 148*  BILITOT 0.6 0.7   Lipids  Recent Labs  Lab 03/24/21 0224  CHOL 129  TRIG 61  HDL 61   LDLCALC 56  CHOLHDL 2.1    Hematology Recent Labs  Lab 03/21/21 0250 03/23/21 0201 03/24/21 0224  WBC 4.8 5.6 5.4  RBC 4.27 3.67* 3.98*  HGB 10.7* 9.2* 10.0*  HCT 34.2* 29.5* 33.4*  MCV 80.1 80.4 83.9  MCH 25.1* 25.1* 25.1*  MCHC 31.3 31.2 29.9*  RDW 16.8* 16.9* 17.2*  PLT 202 166 175   Thyroid No results for input(s): TSH, FREET4 in the last 168 hours.  BNPNo results for input(s): BNP, PROBNP in the last 168 hours.  DDimer No results for input(s): DDIMER in the last 168 hours.   Radiology/Studies:  PERIPHERAL VASCULAR CATHETERIZATION  Result Date: 03/23/2021 Images from the original result were not included. 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 Donzetta Matters, MD Procedure Performed: 1.  Ultrasound-guided cannulation left common femoral artery 2.  Aortogram 3.  Selection of right common femoral artery and right lower extremity angiography 4.  Mynx device closure left common femoral artery Indications: 80 year old male with right great toe ulceration.  He has decreased ABIs.  He is indicated for angiography with possible intervention. Findings: The aorta was heavily calcified renal arteries were patent.  There was severely decreased flow given likely low ejection fraction.  Right hypogastric artery was patent left hypogastric artery was occluded.  Given patient's confusion and combativeness on the table we were only able to evaluate the right lower extremity we selected the right common femoral artery.  The SFA is occluded just after the takeoff for approximately 10 cm and reconstitutes down to the level of the above-knee popliteal artery was then occluded at the level of the knee and below.  He reconstitutes the posterior tibial artery at its takeoff it appears to run to the level of the foot. Patient will be  considered for right femoral to posterior tibial artery bypass.  Vein mapping is ordered. Patient was combative throughout the procedure  Procedure:  The patient was identified in the holding area and taken to room 8.  The patient was then placed supine on the table and prepped and draped in the usual sterile fashion.  A time out was called.  Ultrasound was used to evaluate the left common femoral artery.  There was a little bit of calcification posteriorly.  We did find an area  anteriorly that was soft and amenable for cannulation.  There is no signs 1% lidocaine cannulated micropuncture needle followed by wire and sheath.  Bentson wires placed followed by 5 [place] sheath.  We then placed a pigtail catheter to the level of L1 performed aortogram followed by pelvic angiogram.  We elected to cross the bifurcation performed right lower extremity angiography.  This was very difficult with the patient's combativeness we were able to identify a posterior tibial artery as his runoff.  Patient will be considered for bypass.  We removed the catheter over wire we deployed a minx device.  He was combative but the procedure overall had no complications. Contrast: 120 cc Brandon C. Donzetta Matters, MD [facility] Office: [phone] Pager: [phone]   DG CHEST PORT 1 VIEW  Result Date: 03/22/2021 CLINICAL DATA:  Left-sided chest pain for 2 days EXAM: PORTABLE CHEST 1 VIEW COMPARISON:  02/08/2021 FINDINGS: Pacer/AICD device with leads at right atrium and right ventricle. Prior median sternotomy. Midline trachea. Mild cardiomegaly. Atherosclerosis in the transverse aorta. No pleural effusion or pneumothorax. Skin fold projects over the lateral right hemithorax. No congestive failure. Clear lungs. IMPRESSION: No acute cardiopulmonary disease. Aortic Atherosclerosis (ICD10-I70.0). Electronically Signed   By: Abigail Miyamoto M.D.   On: 03/22/2021 13:48   DG Toe Great Right  Result Date:  03/20/2021 CLINICAL DATA:  Toe infection EXAM: RIGHT GREAT TOE COMPARISON:  None. FINDINGS: Mild focal osseous irregularity of the lateral tuft. No evidence of fracture or dislocation. Soft tissue swelling of the foot. Partially detached great toe toenail. IMPRESSION: Mild focal osseous irregularity of the lateral tuft, which is concerning for osteomyelitis. Finding could be further evaluated with MRI. Electronically Signed   By: Yetta Glassman M.D.   On: 03/20/2021 13:16   VAS [place] ABI WITH/WO TBI  Result Date: 03/21/2021  LOWER EXTREMITY DOPPLER STUDY Patient Name:  ROBSON TRICKEY  Date of Exam:   03/21/2021 Medical Rec #: [id number]           Accession #:    [id number] Date of Birth: February 23, 1941           Patient Gender: M Patient Age:   41 years Exam [facility]  [facility] Procedure:      VAS [place] ABI WITH/WO TBI Referring Phys: Fuller Plan --------------------------------------------------------------------------------  Indications: Ulceration. High Risk Factors: Hypertension, current smoker, prior CVA. Other Factors: Sick sinus syndrome, pacemaker. Residual left hemiparesis,                dysphagia, and dysarthria from prior CVA.  Performing Technologist: Sharion Dove RVS  Examination Guidelines: A complete evaluation includes at minimum, Doppler waveform signals and systolic blood pressure reading at the level of bilateral brachial, anterior tibial, and posterior tibial arteries, when vessel segments are accessible. Bilateral testing is considered an integral part of a complete examination. Photoelectric Plethysmograph (PPG) waveforms and toe systolic pressure readings are included as required and additional duplex testing as needed. Limited examinations for reoccurring indications may be performed as noted.  ABI Findings: +---------+------------------+-----+-------------------+--------+ Right    Rt Pressure (mmHg)IndexWaveform           Comment   +---------+------------------+-----+-------------------+--------+ Brachial 143                    multiphasic                 +---------+------------------+-----+-------------------+--------+ PTA      52  0.36 dampened monophasic         +---------+------------------+-----+-------------------+--------+ DP                              absent                      +---------+------------------+-----+-------------------+--------+ Great Toe11                0.08                             +---------+------------------+-----+-------------------+--------+ +---------+------------------+-----+-----------+-------+ Left     Lt Pressure (mmHg)IndexWaveform   Comment +---------+------------------+-----+-----------+-------+ Brachial 136                    multiphasic        +---------+------------------+-----+-----------+-------+ PTA      97                0.68 monophasic         +---------+------------------+-----+-----------+-------+ DP       72                0.50 monophasic         +---------+------------------+-----+-----------+-------+ Great Toe40                0.28                    +---------+------------------+-----+-----------+-------+ +-------+-----------+-----------+------------+------------+ ABI/TBIToday's ABIToday's TBIPrevious ABIPrevious TBI +-------+-----------+-----------+------------+------------+ Right  0.36       0.08                                +-------+-----------+-----------+------------+------------+ Left   0.68       0.28                                +-------+-----------+-----------+------------+------------+  Summary: Right: Resting right ankle-brachial index indicates severe right lower extremity arterial disease. The right toe-brachial index is abnormal. Left: Resting left ankle-brachial index indicates moderate left lower extremity arterial disease. The left toe-brachial index is abnormal.  *See table(s)  above for measurements and observations.  Electronically signed by Monica Martinez MD on 03/21/2021 at 10:41:09 AM.    Final    ECHOCARDIOGRAM COMPLETE  Result Date: 03/23/2021    ECHOCARDIOGRAM REPORT   Patient Name:   Todd Dunn Date of Exam: 03/23/2021 Medical Rec #:  [id number]          Height:       73.0 in Accession #:    [id number]         Weight:       137.1 lb Date of Birth:  23-Mar-1941          BSA:          1.833 m Patient Age:    26 years           BP:           149/64 mmHg Patient Gender: M                  HR:           74 bpm. Exam Location:  Inpatient Procedure: 2D Echo, Cardiac Doppler and Color Doppler Indications:     Chest Pain  History:  Patient has prior history of Echocardiogram examinations. Risk                  Factors:Family History of Coronary Artery Disease and                  Hypertension.  Sonographer:     Dub Mikes Sonographer#2:   Helmut Muster Referring Phys:  [id number] IONG XU Diagnosing Phys: Candee Furbish MD  Sonographer Comments: Image acquisition challenging due to patient behavioral factors. and Image acquisition challenging due to uncooperative patient. IMPRESSIONS  1. Left ventricular ejection fraction, by estimation, is 30 to 35%. The left ventricle has moderately decreased function. The left ventricle demonstrates global hypokinesis. The left ventricular internal cavity size was mildly dilated. Left ventricular diastolic parameters are consistent with Grade I diastolic dysfunction (impaired relaxation).  2. Right ventricular systolic function is normal. The right ventricular size is normal. There is mildly elevated pulmonary artery systolic pressure. The estimated right ventricular systolic pressure is 29.5 mmHg.  3. Left atrial size was moderately dilated.  4. Right atrial size was moderately dilated.  5. The mitral valve is normal in structure. Trivial mitral valve regurgitation. No evidence of mitral stenosis.  6. Tricuspid valve regurgitation is  moderate.  7. The aortic valve is tricuspid. Aortic valve regurgitation is not visualized. Mild to moderate aortic valve sclerosis/calcification is present, without any evidence of aortic stenosis.  8. The inferior vena cava is dilated in size with >50% respiratory variability, suggesting right atrial pressure of 8 mmHg. FINDINGS  Left Ventricle: Left ventricular ejection fraction, by estimation, is 30 to 35%. The left ventricle has moderately decreased function. The left ventricle demonstrates global hypokinesis. The left ventricular internal cavity size was mildly dilated. There is no left ventricular hypertrophy. Left ventricular diastolic parameters are consistent with Grade I diastolic dysfunction (impaired relaxation). Right Ventricle: The right ventricular size is normal. No increase in right ventricular wall thickness. Right ventricular systolic function is normal. There is mildly elevated pulmonary artery systolic pressure. The tricuspid regurgitant velocity is 2.88  m/s, and with an assumed right atrial pressure of 8 mmHg, the estimated right ventricular systolic pressure is 28.4 mmHg. Left Atrium: Left atrial size was moderately dilated. Right Atrium: Right atrial size was moderately dilated. Pericardium: There is no evidence of pericardial effusion. Mitral Valve: The mitral valve is normal in structure. Trivial mitral valve regurgitation. No evidence of mitral valve stenosis. Tricuspid Valve: The tricuspid valve is normal in structure. Tricuspid valve regurgitation is moderate . No evidence of tricuspid stenosis. Aortic Valve: The aortic valve is tricuspid. Aortic valve regurgitation is not visualized. Mild to moderate aortic valve sclerosis/calcification is present, without any evidence of aortic stenosis. Pulmonic Valve: The pulmonic valve was normal in structure. Pulmonic valve regurgitation is not visualized. No evidence of pulmonic stenosis. Aorta: The aortic root is normal in size and structure.  Venous: The inferior vena cava is dilated in size with greater than 50% respiratory variability, suggesting right atrial pressure of 8 mmHg. IAS/Shunts: No atrial level shunt detected by color flow Doppler. Additional Comments: A device lead is visualized in the right ventricle.  LEFT VENTRICLE PLAX 2D LVIDd:         5.50 cm      Diastology LVIDs:         4.40 cm      LV e' medial:    4.79 cm/s LV PW:         1.30 cm      LV  E/e' medial:  14.5 LV IVS:        1.30 cm      LV e' lateral:   10.26 cm/s LVOT diam:     2.20 cm      LV E/e' lateral: 6.8 LV SV:         97 LV SV Index:   53 LVOT Area:     3.80 cm  LV Volumes (MOD) LV vol d, MOD A2C: 103.9 ml LV vol d, MOD A4C: 150.0 ml LV vol s, MOD A2C: 64.6 ml LV vol s, MOD A4C: 95.7 ml LV SV MOD A2C:     39.3 ml LV SV MOD A4C:     150.0 ml LV SV MOD BP:      47.1 ml RIGHT VENTRICLE             IVC RV S prime:     12.20 cm/s  IVC diam: 2.20 cm TAPSE (M-mode): 2.2 cm LEFT ATRIUM             Index        RIGHT ATRIUM           Index LA diam:        4.00 cm 2.18 cm/m   RA Area:     18.00 cm LA Vol (A2C):   80.9 ml 44.14 ml/m  RA Volume:   50.20 ml  27.39 ml/m LA Vol (A4C):   88.0 ml 48.01 ml/m LA Biplane Vol: 82.9 ml 45.23 ml/m  AORTIC VALVE LVOT Vmax:   128.00 cm/s LVOT Vmean:  79.800 cm/s LVOT VTI:    0.255 m  AORTA Ao Root diam: 3.60 cm Ao Asc diam:  3.70 cm MITRAL VALVE               TRICUSPID VALVE MV Area (PHT): 1.79 cm    TR Peak grad:   33.2 mmHg MV Decel Time: 423 msec    TR Vmax:        288.00 cm/s MV E velocity: 69.40 cm/s MV A velocity: 77.60 cm/s  SHUNTS MV E/A ratio:  0.89        Systemic VTI:  0.26 m                            Systemic Diam: 2.20 cm Candee Furbish MD Electronically signed by Candee Furbish MD Signature Date/Time: 03/23/2021/4:53:31 PM    Final      Assessment and Plan:   Preop cardiovascular evaluation -I am unable to get much information from him, but his functional status is very poor. -His RCRI is 3 which gives him an 11% risk of  major perioperative events. -His Duke Activity Status Index is 0, giving him a functional capacity in METS at 2.74 - All options were discussed by the surgical team with the patient's granddaughter.  She prefers that he have the surgery. - He is at increased risk for the procedure because of multiple comorbidities, will leave to MD to determine if the risk is prohibitive  2.  History of [employer] dual-lead ICD - We will get the device interrogated today  3.  History of CABG - The only information that we have is he had PCI's according to a cath report from 2014, he had bypass surgery sometime after that, no further details are available - Would continue Plavix, Imdur, statin in the perioperative period - As he has a pacemaker, MD advise on starting a beta-blocker.  4.  Ischemic cardiomyopathy with an EF 30-35% by echo this admission - His EF had previously been that low but had come up to 40-45% in 2021 - Prior to admission, he was on Lasix 20 mg a day, hydralazine 50 mg twice daily, Imdur 60 mg a day, lisinopril 40 mg a day - Additionally, he was on amlodipine 10 mg daily - Currently he is on hydralazine 25 mg twice daily with hydralazine 50 mg every 8 hours as needed SBP greater than 170 as well as his home dose of isosorbide and amlodipine -Lasix is on hold - With his EF at 30%, discuss with MD if amlodipine is the best drug - Discuss starting carvedilol - Renal function is normal, can add spironolactone once he is being started back on home meds including diuretics  5.  Hypertension: - See med list above - Overnight, SBP was 130, but has generally been in the 150s-160s. -Since he is off his home lisinopril 40 mg and Lasix 20 mg, will increase the hydralazine to 50 mg 3 times daily   Risk Assessment/Risk Scores:       New York Heart Association (NYHA) Functional Class NYHA Class II     For questions or updates, please contact [place] Please consult [email or web address] for  contact info under    Signed, Rosaria Ferries, PA-C  03/24/2021 9:16 AM  I have examined the patient and reviewed assessment and plan and discussed with patient.  Agree with above as stated.    Minimal info regarding his prior CABG.  Now requires urgent limb asalvage surgery.  Negative troponins.  Does not report chest pain.  Appears to be euvolemic. EF 30-35%.    Has been hospitalized for UTI in [place].  Relatively high risk for surgery as noted above.  No modifiable risk factors at this time.  No further testing needed from a cardiac standpoint.  Will deal with cardiac issues if they come up postop.  Larae Grooms

## 2021-03-24 NOTE — Progress Notes (Signed)
  Progress Note    03/24/2021 10:06 AM 1 Day Post-Op  Subjective:  resting comfortably  Vitals:   03/24/21 0333 03/24/21 0833  BP: (!) 166/83 (!) 156/66  Pulse: 75 65  Resp:  15  Temp: 97.9 F (36.6 C) 98.3 F (36.8 C)  SpO2: 97% 99%    Physical Exam: Resting comfortably Non labored respirations Abdomen is soft R great toe with dry gangrene  CBC    Component Value Date/Time   WBC 5.4 03/24/2021 0224   RBC 3.98 (L) 03/24/2021 0224   HGB 10.0 (L) 03/24/2021 0224   HGB 10.1 (L) 01/06/2021 1506   HCT 33.4 (L) 03/24/2021 0224   PLT 175 03/24/2021 0224   PLT 158 01/06/2021 1506   MCV 83.9 03/24/2021 0224   MCH 25.1 (L) 03/24/2021 0224   MCHC 29.9 (L) 03/24/2021 0224   RDW 17.2 (H) 03/24/2021 0224   LYMPHSABS 1.3 03/24/2021 0224   MONOABS 0.7 03/24/2021 0224   EOSABS 0.0 03/24/2021 0224   BASOSABS 0.0 03/24/2021 0224    BMET    Component Value Date/Time   NA 137 03/24/2021 0224   K 4.0 03/24/2021 0224   CL 107 03/24/2021 0224   CO2 22 03/24/2021 0224   GLUCOSE 117 (H) 03/24/2021 0224   BUN 9 03/24/2021 0224   CREATININE 0.73 03/24/2021 0224   CREATININE 0.79 01/06/2021 1506   CALCIUM 9.3 03/24/2021 0224   GFRNONAA >60 03/24/2021 0224   GFRNONAA >60 01/06/2021 1506    INR    Component Value Date/Time   INR 1.1 02/14/2021 1631     Intake/Output Summary (Last 24 hours) at 03/24/2021 1006 Last data filed at 03/24/2021 0500 Gross per 24 hour  Intake 1520 ml  Output 1350 ml  Net 170 ml     Assessment/plan:  80 y.o. male is s/p angiogram, will need R femoral to PT, no suitable vein. EF 30-35% on echo will get cardiology evaluation. He is likely to be high risk for any surgery. I have discussed with granddaughter and she wants everything possible to be done. He remains confused and not answering questions appropriately.    Williette Loewe C. Donzetta Matters, MD [facility] [place] Office: [phone] Pager: [phone]  03/24/2021 10:06  AM

## 2021-03-24 NOTE — Plan of Care (Signed)
  Problem: Clinical Measurements: Goal: Ability to maintain clinical measurements within normal limits will improve Outcome: Progressing   Problem: Clinical Measurements: Goal: Diagnostic test results will improve Outcome: Progressing   Problem: Clinical Measurements: Goal: Respiratory complications will improve Outcome: Progressing   Problem: Clinical Measurements: Goal: Cardiovascular complication will be avoided Outcome: Progressing   Problem: Activity: Goal: Risk for activity intolerance will decrease Outcome: Progressing   Problem: Nutrition: Goal: Adequate nutrition will be maintained Outcome: Progressing   Problem: Coping: Goal: Level of anxiety will decrease Outcome: Progressing   Problem: Elimination: Goal: Will not experience complications related to bowel motility Outcome: Progressing   Problem: Pain Managment: Goal: General experience of comfort will improve Outcome: Progressing   Problem: Safety: Goal: Ability to remain free from injury will improve Outcome: Progressing   Problem: Skin Integrity: Goal: Risk for impaired skin integrity will decrease Outcome: Progressing   Problem: Safety: Goal: Non-violent Restraint(s) Outcome: Progressing

## 2021-03-24 NOTE — Care Management (Signed)
Verified with [facility] that patient is active prior to admission for [facility] PT.

## 2021-03-24 NOTE — TOC Benefit Eligibility Note (Signed)
Patient Teacher, English as a foreign language completed.    The patient is currently admitted and upon discharge could be taking Entresto 24-26 mg.  The current 30 day co-pay is, $0.00.   The patient is currently admitted and upon discharge could be taking Farxiga 10 mg.  The current 30 day co-pay is, $0.00.   The patient is currently admitted and upon discharge could be taking Jardiance 10 mg.  The current 30 day co-pay is, $0.00.   The patient is insured through [place] [place] Patient Advocate Specialist [place] Team Direct Number: [phone]  Fax: [phone]

## 2021-03-24 NOTE — Plan of Care (Signed)
  Problem: Clinical Measurements: Goal: Ability to maintain clinical measurements within normal limits will improve Outcome: Progressing   Problem: Clinical Measurements: Goal: Diagnostic test results will improve Outcome: Progressing   Problem: Clinical Measurements: Goal: Respiratory complications will improve Outcome: Progressing   Problem: Clinical Measurements: Goal: Cardiovascular complication will be avoided Outcome: Progressing   Problem: Activity: Goal: Risk for activity intolerance will decrease Outcome: Progressing   Problem: Nutrition: Goal: Adequate nutrition will be maintained Outcome: Progressing   Problem: Coping: Goal: Level of anxiety will decrease Outcome: Progressing   Problem: Elimination: Goal: Will not experience complications related to urinary retention Outcome: Progressing   Problem: Pain Managment: Goal: General experience of comfort will improve Outcome: Progressing   Problem: Safety: Goal: Ability to remain free from injury will improve Outcome: Progressing   Problem: Skin Integrity: Goal: Risk for impaired skin integrity will decrease Outcome: Progressing   Problem: Safety: Goal: Non-violent Restraint(s) Outcome: Progressing

## 2021-03-25 ENCOUNTER — Inpatient Hospital Stay (HOSPITAL_COMMUNITY): Payer: Medicare Other

## 2021-03-25 DIAGNOSIS — L089 Local infection of the skin and subcutaneous tissue, unspecified: Secondary | ICD-10-CM | POA: Diagnosis not present

## 2021-03-25 DIAGNOSIS — T148XXA Other injury of unspecified body region, initial encounter: Secondary | ICD-10-CM | POA: Diagnosis not present

## 2021-03-25 DIAGNOSIS — I70261 Atherosclerosis of native arteries of extremities with gangrene, right leg: Secondary | ICD-10-CM

## 2021-03-25 LAB — BASIC METABOLIC PANEL
Anion gap: 5 (ref 5–15)
BUN: 8 mg/dL (ref 8–23)
CO2: 26 mmol/L (ref 22–32)
Calcium: 9.6 mg/dL (ref 8.9–10.3)
Chloride: 98 mmol/L (ref 98–111)
Creatinine, Ser: 0.76 mg/dL (ref 0.61–1.24)
GFR, Estimated: >60 mL/min (ref >60)
Glucose, Bld: 123 mg/dL — ABNORMAL HIGH (ref 70–99)
Potassium: 3.4 mmol/L — ABNORMAL LOW (ref 3.5–5.1)
Sodium: 129 mmol/L — ABNORMAL LOW (ref 135–145)

## 2021-03-25 LAB — CBC WITH DIFFERENTIAL/PLATELET
Abs Immature Granulocytes: 0.01 10*3/uL (ref 0.00–0.07)
Basophils Absolute: 0 10*3/uL (ref 0.0–0.1)
Basophils Relative: 0 %
Eosinophils Absolute: 0 10*3/uL (ref 0.0–0.5)
Eosinophils Relative: 0 %
HCT: 35.1 % — ABNORMAL LOW (ref 39.0–52.0)
Hemoglobin: 11 g/dL — ABNORMAL LOW (ref 13.0–17.0)
Immature Granulocytes: 0 %
Lymphocytes Relative: 25 %
Lymphs Abs: 1.3 10*3/uL (ref 0.7–4.0)
MCH: 25.2 pg — ABNORMAL LOW (ref 26.0–34.0)
MCHC: 31.3 g/dL (ref 30.0–36.0)
MCV: 80.3 fL (ref 80.0–100.0)
Monocytes Absolute: 0.6 10*3/uL (ref 0.1–1.0)
Monocytes Relative: 11 %
Neutro Abs: 3.3 10*3/uL (ref 1.7–7.7)
Neutrophils Relative %: 64 %
Platelets: 201 10*3/uL (ref 150–400)
RBC: 4.37 MIL/uL (ref 4.22–5.81)
RDW: 17.1 % — ABNORMAL HIGH (ref 11.5–15.5)
WBC: 5.3 10*3/uL (ref 4.0–10.5)
nRBC: 0 % (ref 0.0–0.2)

## 2021-03-25 LAB — CULTURE, BLOOD (ROUTINE X 2)
Culture: NO GROWTH
Culture: NO GROWTH
Special Requests: ADEQUATE
Special Requests: ADEQUATE

## 2021-03-25 MED ORDER — POTASSIUM CHLORIDE CRYS ER 20 MEQ PO TBCR
40.0000 meq | EXTENDED_RELEASE_TABLET | Freq: Once | ORAL | Status: AC
  Administered 2021-03-25: 40 meq via ORAL
  Filled 2021-03-25: qty 2

## 2021-03-25 MED ORDER — HYDRALAZINE HCL 20 MG/ML IJ SOLN: 5.0000 mg | INTRAMUSCULAR | Status: DC | PRN

## 2021-03-25 NOTE — Progress Notes (Signed)
No new recommendations. See consult note.

## 2021-03-25 NOTE — Progress Notes (Addendum)
Todd Dunn (Patient's grand daughter) came to visit. She verbalized concerns and questions about the incoming surgery. Paged the Vascular on call physician, Dr. Orlie Pollen and he spoke to the relative over the phone.   7H, [place] daughter left. She verbalized that she is still no decision about the surgery. She also mentioned that all her questions were answered by Dr. Unice Bailey. Informed charge nurse. Will keep patient NPO PMN. Will follow up.

## 2021-03-25 NOTE — Progress Notes (Signed)
  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 Carlis Abbott NPO after midnight Consent ordered Morning labs   Karoline Caldwell, [place] [facility] [phone] 03/25/2021 8:28 AM

## 2021-03-25 NOTE — Progress Notes (Signed)
Per order, Changed device settings for MRI to  AOO  at 85 bpm   Tachy-therapies to off if applicable.   Will program device back to pre-MRI settings after completion of exam.

## 2021-03-25 NOTE — Progress Notes (Signed)
Informed of MRI for today.   Device system confirmed to be MRI conditional, with implant date > 6 weeks ago, and no evidence of abandoned or epicardial leads in review of most recent CXR Interrogation from today reviewed, pt is currently AS-VS at ~75 bpm with <1% V-pacing by counters  Change device settings for MRI to  AOO  at 85 bpm  Tachy-therapies to off if applicable.  Program device back to pre-MRI settings after completion of exam.  Annamaria Helling  03/25/2021 2:14 PM

## 2021-03-25 NOTE — TOC Initial Note (Signed)
Transition of Care [facility]) - Initial/Assessment Note    Patient Details  Name: Todd Dunn MRN: [id number] Date of Birth: Jan 28, 1941  [facility] CM/SW Contact:    Carles Collet, RN Phone Number: 03/25/2021, 4:38 PM  Clinical Narrative:                Patient admitted from home w wound of toe to right foot. History of SSS s/p pacemaker, EF 30-35%, CVA with residual left hemiplegia, dysphagia, and dysarthria, BPH, neurogenic bladder with chronic Foley, and recurrent UTIs Patient is confused, attempt to call granddaughter, only listed contact, unable to reach her.Family to decide between vascular intervention vs wound care w potential future amputation.  Patient is active w [facility]. Unsure of treatment plan and disposition at this time. TOC will continue to follow.        Smith,Sandra Granddaughter   [phone]       Barriers to Discharge: Continued Medical Work up   Patient Goals and CMS Choice        Expected Discharge Plan and Services                                                Prior Living Arrangements/Services                       Activities of Daily Living Home Assistive Devices/Equipment: Wheelchair ADL Screening (condition at time of admission) Patient's cognitive ability adequate to safely complete daily activities?: Yes Is the patient deaf or have difficulty hearing?: No Does the patient have difficulty seeing, even when wearing glasses/contacts?: No Does the patient have difficulty concentrating, remembering, or making decisions?: No Patient able to express need for assistance with ADLs?: Yes Does the patient have difficulty dressing or bathing?: No Independently performs ADLs?: No Communication: Independent Dressing (OT): Needs assistance Is this a change from baseline?: Pre-admission baseline Grooming: Independent Feeding: Independent, Needs assistance Is this a change from baseline?: Pre-admission  baseline Bathing: Appropriate for developmental age 3: Appropriate for developmental age In/Out Bed: Needs assistance Is this a change from baseline?: Pre-admission baseline Walks in Home: Dependent Is this a change from baseline?: Pre-admission baseline Does the patient have difficulty walking or climbing stairs?: Yes Weakness of Legs: Both Weakness of Arms/Hands: Left  Permission Sought/Granted                  Emotional Assessment              Admission diagnosis:  Osteomyelitis ([place]) [M86.9] Toe infection [L08.9] Osteomyelitis of great toe of right foot ([place]) [M86.9] Patient Active Problem List   Diagnosis Date Noted   Toe infection    PVD (peripheral vascular disease) ([place])    Osteomyelitis ([place]) 03/20/2021   Wound infection 03/20/2021   Pacemaker 03/20/2021   Elevated alkaline phosphatase level 03/20/2021   Hyperlipidemia 03/20/2021   Hematuria 02/14/2021   Acute cystitis with hematuria    Normocytic anemia 01/06/2021   Catheter-associated urinary tract infection ([place]) 39/76/7341   Acute metabolic encephalopathy 93/79/0240   Chronic indwelling Foley catheter 12/15/2020   HTN (hypertension) 12/15/2020   History of ESBL E. coli infection 12/15/2020   PCP:  Fredrich Romans, [place] Pharmacy:   [facility] [place], [place] [address]  [address] [place] [address] Phone: [phone] Fax: [phone]     Social Determinants of Health (SDOH) Interventions    Readmission Risk Interventions Readmission Risk Prevention Plan 02/17/2021  Transportation Screening Complete  Medication Review Press photographer) Complete  PCP or Specialist appointment within 3-5 days of discharge Complete  HRI or [place] Complete  SW Recovery Care/Counseling Consult Complete  [place] Not Applicable

## 2021-03-25 NOTE — Progress Notes (Signed)
[facility] [place]  Subjective  -continues to be very confused   Objective 102/86 98 97.7 F (36.5 C) (Axillary) 15 100%  Intake/Output Summary (Last 24 hours) at 03/25/2021 1412 Last data filed at 03/25/2021 0931 Gross per 24 hour  Intake 930 ml  Output 1750 ml  Net -820 ml    Dry gangrene to the tip of the right great toe where toenail fell off  Laboratory Lab Results: Recent Labs    03/24/21 0224 03/25/21 0314  WBC 5.4 5.3  HGB 10.0* 11.0*  HCT 33.4* 35.1*  PLT 175 201   BMET Recent Labs    03/24/21 0224 03/25/21 0314  NA 137 129*  K 4.0 3.4*  CL 107 98  CO2 22 26  GLUCOSE 117* 123*  BUN 9 8  CREATININE 0.73 0.76  CALCIUM 9.3 9.6    COAG Lab Results  Component Value Date   INR 1.1 02/14/2021   INR 1.1 01/21/2021   INR 1.2 09/15/2020   No results found for: PTT  Assessment/Planning:  80 year old male that vascular was consulted for critical limb ischemia with tissue loss over the weekend.  After arteriogram he would need a right lower extremity tibial bypass to the posterior tibial artery.  He has no usable long segment surface vein like a saphenous vein.  Discussed with his granddaughter that this would be with a prosthetic graft which is suboptimal given durability and high risk of infection.  Cardiology saw him yesterday and quoted a 11% risk of major perioperative cardiac event and I relayed this information to the family.  He has history of CABG with ischemic cardiomyopathy now with an EF of 30 to 35% along with his other comorbidities.  I think he is at risk for bypass which I relayed to family.  Discussed the other option is palliative wound care and ultimately more proximal amputation in the future.  She is going to come to the hospital this evening and make a final decision.  He is still tentatively posted for bypass tomorrow pending a final decision from the family.  Marty Heck 03/25/2021 2:12 PM --

## 2021-03-25 NOTE — Plan of Care (Signed)
  Problem: Clinical Measurements: Goal: Ability to maintain clinical measurements within normal limits will improve Outcome: Progressing   Problem: Clinical Measurements: Goal: Diagnostic test results will improve Outcome: Progressing   Problem: Clinical Measurements: Goal: Respiratory complications will improve Outcome: Progressing   Problem: Clinical Measurements: Goal: Cardiovascular complication will be avoided Outcome: Progressing   Problem: Activity: Goal: Risk for activity intolerance will decrease Outcome: Progressing   Problem: Nutrition: Goal: Adequate nutrition will be maintained Outcome: Progressing   Problem: Coping: Goal: Level of anxiety will decrease Outcome: Progressing   Problem: Elimination: Goal: Will not experience complications related to urinary retention Outcome: Progressing   Problem: Pain Managment: Goal: General experience of comfort will improve Outcome: Progressing   Problem: Safety: Goal: Ability to remain free from injury will improve Outcome: Progressing

## 2021-03-25 NOTE — Progress Notes (Signed)
PROGRESS NOTE  Todd Dunn  DOB: 12-Feb-1941  PCP: Fredrich Romans, [place] QHU:[id number]  DOA: 03/20/2021  LOS: 5 days  Hospital Day: 6   Chief Complaint  Patient presents with   Wound Check   Toe Pain   Wound Infection    Brief narrative: Todd Dunn is a 80 y.o. male with PMH significant for history of CAD/CABG, ppm for SSS, ICM, CVA with residual left hemiplegia, dysphagia, and dysarthria, neurogenic bladder with chronic Foley, recurrent UTIs and also with SSS s/p pacemaker who lives at home with his granddaughter. Patient was brought to the ED on 10/14 with 2 weeks history of progressive left great toe infection without any antecedent trauma or ulcer.  X-ray right foot concerning for osteomyelitis of the lateral tuft.   Patient was started on cefepime and linezolid. Wound swab culture was sent.. Admitted to hospitalist service Evaluated by orthopedics and vascular surgery. Angiogram showed the need of arterial bypass See below for details  Subjective: Patient was seen and examined this morning.   Lying down in bed not in distress.  Sleepy, opens eyes on verbal command.  Answers few questions but falls right back to sleep. Noted a plan from vascular surgery for bypass tomorrow.  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 -WBC count normal, lactate level normal.  No fever. -Wound culture grew staph aureus. -Currently patient is on IV Ancef Recent Labs  Lab 03/20/21 0901 03/20/21 1101 03/21/21 0250 03/23/21 0201 03/24/21 0224 03/25/21 0314  WBC 5.7  --  4.8 5.6 5.4 5.3  LATICACIDVEN 1.0 1.0  --   --   --   --     Peripheral artery disease -Because of the right great toe wound, peripheral artery disease was suspected, vascular surgery consult was obtained. -10/17, patient underwent aortogram and right lower extremity angiography.  Per report, heavily calcified  aorta with patent renal arteries, severely decreased flow, occluded right SFA.  -Plan is to do a right femoral to posterior tibial artery bypass. -Patient is already on Plavix, statin after his CVA  Ischemic cardiomyopathy with EF 30 to 35% Essential hypertension -Currently heart failure seems compensated. -Prior to admission patient was on Lasix, Imdur, lisinopril, hydralazine and amlodipine -Currently he is on Imdur, hydralazine and amlodipine. Lasix and lisinopril are on hold.   -Cardiology following.  CAD/CABG in 2014 -Currently has no chest pain.  Seen by cardiology.  Continue Plavix, Imdur, statin.  History of SSS s/p pacemaker -currently paced rhythm  History of CVA with residual left hemiplegia, dysarthria, dysphagia -Continue statin and Plavix -Baseline wheelchair-bound, lives at home  UTI related to chronic Foley History of recurrent UTI Neurogenic bladder /chronic indwelling Foley -Urine culture sent on admission grew 70,000 colonies per mL of Proteus mirabilis. -Already on antibiotics.   -Foley changed on 10/14 in the ED. -Continue Proscar and Flomax   Acute metabolic encephalopathy -Intermittent restlessness, agitation. -Per granddaughter, patient tends to have these episodes when he gets UTI.  She states that his stroke was several years ago and did not leave any residual cognitive deficit or change in mental status. -Continue to monitor mental status change  Depression/anxiety -Celexa 20 mg at bedtime  Hypokalemia -Replacement given today.  Recheck tomorrow Recent Labs  Lab 03/20/21 0901 03/21/21 0250 03/23/21 0201 03/24/21 0224 03/25/21 0314  K 4.0 4.1 4.1 4.0 3.4*   Hyponatremia -Sodium level low at 129 today, probably because of poor oral intake.  Was normal till yesterday.   Recent Labs  Lab 03/20/21 0901 03/21/21 0250 03/23/21 0201 03/24/21 0224 03/25/21 0314  NA 138 135 135 137 129*    Mobility: PT eval postprocedure Code Status:   Code  Status: Full Code  Nutritional status: Body mass index is 18.09 kg/m. Nutrition Problem: Increased nutrient needs Etiology: wound healing Signs/Symptoms: estimated needs Diet:  Diet Order             Diet NPO time specified  Diet effective midnight           Diet Heart Room service appropriate? Yes; Fluid consistency: Thin  Diet effective now                  DVT prophylaxis:  enoxaparin (LOVENOX) injection 40 mg Start: 03/20/21 1600   Antimicrobials: IV Ancef Fluid: Per protocol post angiogram Consultants: Vascular surgery, orthopedics Family Communication: None at bedside  Status is: Inpatient  Remains inpatient appropriate because: Ongoing vascular work-up  Dispo: The patient is from: Home              Anticipated d/c is to: Pending arterial bypass today              Patient currently is not medically stable to d/c.   Difficult to place patient No     Infusions:   sodium chloride 250 mL (03/22/21 5625)   sodium chloride     sodium chloride      ceFAZolin (ANCEF) IV 2 g (03/25/21 0509)    Scheduled Meds:  amLODipine  10 mg Oral Daily   Chlorhexidine Gluconate Cloth  6 each Topical Daily   citalopram  20 mg Oral QHS   clopidogrel  75 mg Oral QHS   enoxaparin (LOVENOX) injection  40 mg Subcutaneous Q24H   famotidine  10 mg Oral Daily   feeding supplement  237 mL Oral TID BM   finasteride  5 mg Oral Daily   hydrALAZINE  50 mg Oral BID   isosorbide mononitrate  60 mg Oral Daily    morphine injection  2 mg Intravenous Once   multivitamin with minerals  1 tablet Oral Daily   rosuvastatin  10 mg Oral Daily   senna-docusate  1 tablet Oral BID   sodium chloride flush  3 mL Intravenous Q12H   sodium chloride flush  3 mL Intravenous Q12H   tamsulosin  0.4 mg Oral Daily    Antimicrobials: Anti-infectives (From admission, onward)    Start     Dose/Rate Route Frequency Ordered Stop   03/24/21 1415  ceFAZolin (ANCEF) IVPB 2g/100 mL premix        2 g 200  mL/hr over 30 Minutes Intravenous Every 8 hours 03/24/21 1329     03/20/21 2215  linezolid (ZYVOX) IVPB 600 mg  Status:  Discontinued        600 mg 300 mL/hr over 60 Minutes Intravenous Every 12 hours 03/20/21 2208 03/24/21 1329   03/20/21 2215  ceFEPIme (MAXIPIME) 2 g in sodium chloride 0.9 % 100 mL IVPB  Status:  Discontinued        2 g 200 mL/hr over 30 Minutes Intravenous Every 8 hours 03/20/21 2208 03/24/21 1329   03/20/21 1315  clindamycin (CLEOCIN) capsule 300 mg  Status:  Discontinued        300 mg Oral  Once 03/20/21 1306 03/20/21 1338       PRN meds: sodium chloride, sodium chloride, acetaminophen **OR** acetaminophen, albuterol, hydrALAZINE, HYDROcodone-acetaminophen, labetalol, LORazepam, ondansetron **  OR** ondansetron (ZOFRAN) IV, sodium chloride flush   Objective: Vitals:   03/25/21 0946 03/25/21 1312  BP: (!) 151/72 102/86  Pulse:  98  Resp:  15  Temp:  97.7 F (36.5 C)  SpO2:  100%    Intake/Output Summary (Last 24 hours) at 03/25/2021 1341 Last data filed at 03/25/2021 0931 Gross per 24 hour  Intake 930 ml  Output 1750 ml  Net -820 ml    Filed Weights   03/21/21 0006  Weight: 62.2 kg   Weight change:  Body mass index is 18.09 kg/m.   Physical Exam: General exam: Ederly African-American male.  Not in pain skin: No rashes, lesions or ulcers. HEENT: Atraumatic, normocephalic, no obvious bleeding Lungs: Clear to auscultation bilaterally CVS: Regular rate and rhythm, no murmur GI/Abd soft, nontender, nondistended, bowel sound present CNS: Currently sleepy.  Opens eyes and required .  Has baseline left-sided weakness from previous stroke  psychiatry: Mood appropriate Extremities: No pedal edema, no calf tenderness, left toenail avulsion and infection  Data Review: I have personally reviewed the laboratory data and studies available.  Recent Labs  Lab 03/20/21 0901 03/21/21 0250 03/23/21 0201 03/24/21 0224 03/25/21 0314  WBC 5.7 4.8 5.6 5.4 5.3   NEUTROABS 3.8  --   --  3.3 3.3  HGB 10.4* 10.7* 9.2* 10.0* 11.0*  HCT 35.3* 34.2* 29.5* 33.4* 35.1*  MCV 83.6 80.1 80.4 83.9 80.3  PLT 229 202 166 175 201    Recent Labs  Lab 03/20/21 0901 03/21/21 0250 03/23/21 0201 03/24/21 0224 03/25/21 0314  NA 138 135 135 137 129*  K 4.0 4.1 4.1 4.0 3.4*  CL 104 104 106 107 98  CO2 29 26 25 22 26   GLUCOSE 113* 104* 110* 117* 123*  BUN 20 16 13 9 8   CREATININE 0.84 0.65 0.78 0.73 0.76  CALCIUM 9.8 9.5 9.4 9.3 9.6     F/u labs ordered Unresulted Labs (From admission, onward)     Start     Ordered   03/24/21 0500  CBC with Differential/Platelet  Daily,   R      03/23/21 1454   03/24/21 8032  Basic metabolic panel  Daily,   R      03/23/21 1454            Signed, Terrilee Croak, MD [facility] 03/25/2021

## 2021-03-26 ENCOUNTER — Inpatient Hospital Stay (HOSPITAL_COMMUNITY): Payer: Medicare Other | Admitting: Certified Registered"

## 2021-03-26 ENCOUNTER — Encounter (HOSPITAL_COMMUNITY)
Admission: EM | Disposition: A | Discharge status: Skilled Nursing Facility | Payer: Self-pay | Source: Home / Self Care | Attending: Internal Medicine

## 2021-03-26 DIAGNOSIS — I70261 Atherosclerosis of native arteries of extremities with gangrene, right leg: Secondary | ICD-10-CM | POA: Diagnosis not present

## 2021-03-26 DIAGNOSIS — L089 Local infection of the skin and subcutaneous tissue, unspecified: Secondary | ICD-10-CM | POA: Diagnosis not present

## 2021-03-26 DIAGNOSIS — T148XXA Other injury of unspecified body region, initial encounter: Secondary | ICD-10-CM | POA: Diagnosis not present

## 2021-03-26 HISTORY — PX: FEMORAL-TIBIAL BYPASS GRAFT: SHX938

## 2021-03-26 LAB — CBC WITH DIFFERENTIAL/PLATELET
Abs Immature Granulocytes: 0.02 10*3/uL (ref 0.00–0.07)
Basophils Absolute: 0 10*3/uL (ref 0.0–0.1)
Basophils Relative: 1 %
Eosinophils Absolute: 0 10*3/uL (ref 0.0–0.5)
Eosinophils Relative: 1 %
HCT: 35.2 % — ABNORMAL LOW (ref 39.0–52.0)
Hemoglobin: 10.8 g/dL — ABNORMAL LOW (ref 13.0–17.0)
Immature Granulocytes: 0 %
Lymphocytes Relative: 31 %
Lymphs Abs: 1.4 10*3/uL (ref 0.7–4.0)
MCH: 24.8 pg — ABNORMAL LOW (ref 26.0–34.0)
MCHC: 30.7 g/dL (ref 30.0–36.0)
MCV: 80.9 fL (ref 80.0–100.0)
Monocytes Absolute: 0.5 10*3/uL (ref 0.1–1.0)
Monocytes Relative: 11 %
Neutro Abs: 2.5 10*3/uL (ref 1.7–7.7)
Neutrophils Relative %: 56 %
Platelets: 210 10*3/uL (ref 150–400)
RBC: 4.35 MIL/uL (ref 4.22–5.81)
RDW: 17.1 % — ABNORMAL HIGH (ref 11.5–15.5)
WBC: 4.5 10*3/uL (ref 4.0–10.5)
nRBC: 0 % (ref 0.0–0.2)

## 2021-03-26 LAB — BASIC METABOLIC PANEL
Anion gap: 7 (ref 5–15)
BUN: 11 mg/dL (ref 8–23)
CO2: 28 mmol/L (ref 22–32)
Calcium: 9.9 mg/dL (ref 8.9–10.3)
Chloride: 101 mmol/L (ref 98–111)
Creatinine, Ser: 0.76 mg/dL (ref 0.61–1.24)
GFR, Estimated: >60 mL/min (ref >60)
Glucose, Bld: 113 mg/dL — ABNORMAL HIGH (ref 70–99)
Potassium: 4.3 mmol/L (ref 3.5–5.1)
Sodium: 136 mmol/L (ref 135–145)

## 2021-03-26 LAB — TYPE AND SCREEN
ABO/RH(D): A NEG
Antibody Screen: NEGATIVE

## 2021-03-26 LAB — POCT ACTIVATED CLOTTING TIME: Activated Clotting Time: 271 seconds

## 2021-03-26 LAB — MRSA NEXT GEN BY PCR, NASAL: MRSA by PCR Next Gen: NOT DETECTED

## 2021-03-26 LAB — GLUCOSE, CAPILLARY: Glucose-Capillary: 98 mg/dL (ref 70–99)

## 2021-03-26 SURGERY — CREATION, BYPASS, ARTERIAL, FEMORAL TO TIBIAL, USING GRAFT
Anesthesia: General | Laterality: Right

## 2021-03-26 MED ORDER — DEXAMETHASONE SODIUM PHOSPHATE 10 MG/ML IJ SOLN
INTRAMUSCULAR | Status: DC | PRN
  Administered 2021-03-26: 10 mg via INTRAVENOUS

## 2021-03-26 MED ORDER — ROCURONIUM BROMIDE 10 MG/ML (PF) SYRINGE
PREFILLED_SYRINGE | INTRAVENOUS | Status: AC
  Filled 2021-03-26: qty 10

## 2021-03-26 MED ORDER — LIDOCAINE HCL (CARDIAC) PF 100 MG/5ML IV SOSY
PREFILLED_SYRINGE | INTRAVENOUS | Status: DC | PRN
  Administered 2021-03-26: 40 mg via INTRATRACHEAL

## 2021-03-26 MED ORDER — PANTOPRAZOLE SODIUM 40 MG PO TBEC
40.0000 mg | DELAYED_RELEASE_TABLET | Freq: Every day | ORAL | Status: DC
  Administered 2021-03-26 – 2021-04-05 (×11): 40 mg via ORAL
  Filled 2021-03-26 (×11): qty 1

## 2021-03-26 MED ORDER — DOCUSATE SODIUM 100 MG PO CAPS
100.0000 mg | ORAL_CAPSULE | Freq: Every day | ORAL | Status: DC
  Administered 2021-03-28 – 2021-04-02 (×4): 100 mg via ORAL
  Filled 2021-03-26 (×5): qty 1

## 2021-03-26 MED ORDER — LIDOCAINE 2% (20 MG/ML) 5 ML SYRINGE
INTRAMUSCULAR | Status: AC
  Filled 2021-03-26: qty 5

## 2021-03-26 MED ORDER — EPHEDRINE SULFATE 50 MG/ML IJ SOLN
INTRAMUSCULAR | Status: DC | PRN
  Administered 2021-03-26: 10 mg via INTRAVENOUS

## 2021-03-26 MED ORDER — PROPOFOL 10 MG/ML IV BOLUS
INTRAVENOUS | Status: AC
  Filled 2021-03-26: qty 20

## 2021-03-26 MED ORDER — GUAIFENESIN-DM 100-10 MG/5ML PO SYRP: 15.0000 mL | ORAL_SOLUTION | ORAL | Status: DC | PRN

## 2021-03-26 MED ORDER — VASOPRESSIN 20 UNIT/ML IV SOLN
INTRAVENOUS | Status: AC
  Filled 2021-03-26: qty 1

## 2021-03-26 MED ORDER — SUGAMMADEX SODIUM 200 MG/2ML IV SOLN
INTRAVENOUS | Status: DC | PRN
  Administered 2021-03-26: 200 mg via INTRAVENOUS

## 2021-03-26 MED ORDER — MAGNESIUM SULFATE 2 GM/50ML IV SOLN: 2.0000 g | Freq: Every day | INTRAVENOUS | Status: DC | PRN

## 2021-03-26 MED ORDER — HEPARIN 6000 UNIT IRRIGATION SOLUTION
Status: AC
  Filled 2021-03-26: qty 500

## 2021-03-26 MED ORDER — PROTAMINE SULFATE 10 MG/ML IV SOLN
INTRAVENOUS | Status: DC | PRN
  Administered 2021-03-26: 10 mg via INTRAVENOUS
  Administered 2021-03-26: 40 mg via INTRAVENOUS

## 2021-03-26 MED ORDER — FENTANYL CITRATE (PF) 250 MCG/5ML IJ SOLN
INTRAMUSCULAR | Status: AC
  Filled 2021-03-26: qty 5

## 2021-03-26 MED ORDER — VASOPRESSIN 20 UNIT/ML IV SOLN
INTRAVENOUS | Status: DC | PRN
  Administered 2021-03-26: 1 [IU] via INTRAVENOUS
  Administered 2021-03-26: 2 [IU] via INTRAVENOUS
  Administered 2021-03-26: 1 [IU] via INTRAVENOUS
  Administered 2021-03-26: 2 [IU] via INTRAVENOUS
  Administered 2021-03-26: 1 [IU] via INTRAVENOUS

## 2021-03-26 MED ORDER — PROTAMINE SULFATE 10 MG/ML IV SOLN
INTRAVENOUS | Status: AC
  Filled 2021-03-26: qty 5

## 2021-03-26 MED ORDER — HEPARIN SODIUM (PORCINE) 1000 UNIT/ML IJ SOLN
INTRAMUSCULAR | Status: AC
  Filled 2021-03-26: qty 1

## 2021-03-26 MED ORDER — PHENYLEPHRINE HCL-NACL 20-0.9 MG/250ML-% IV SOLN
INTRAVENOUS | Status: DC | PRN
  Administered 2021-03-26: 50 ug/min via INTRAVENOUS

## 2021-03-26 MED ORDER — PROPOFOL 10 MG/ML IV BOLUS
INTRAVENOUS | Status: DC | PRN
  Administered 2021-03-26: 20 mg via INTRAVENOUS
  Administered 2021-03-26: 60 mg via INTRAVENOUS
  Administered 2021-03-26: 20 mg via INTRAVENOUS

## 2021-03-26 MED ORDER — ALBUMIN HUMAN 5 % IV SOLN: INTRAVENOUS | Status: DC | PRN

## 2021-03-26 MED ORDER — SODIUM CHLORIDE 0.9 % IV SOLN
500.0000 mL | Freq: Once | INTRAVENOUS | Status: AC | PRN
  Administered 2021-04-04: 500 mL via INTRAVENOUS

## 2021-03-26 MED ORDER — ALUM & MAG HYDROXIDE-SIMETH 200-200-20 MG/5ML PO SUSP: 15.0000 mL | ORAL | Status: DC | PRN

## 2021-03-26 MED ORDER — DEXAMETHASONE SODIUM PHOSPHATE 10 MG/ML IJ SOLN
INTRAMUSCULAR | Status: AC
  Filled 2021-03-26: qty 1

## 2021-03-26 MED ORDER — ONDANSETRON HCL 4 MG/2ML IJ SOLN
INTRAMUSCULAR | Status: AC
  Filled 2021-03-26: qty 2

## 2021-03-26 MED ORDER — VANCOMYCIN HCL IN DEXTROSE 1-5 GM/200ML-% IV SOLN
1000.0000 mg | Freq: Two times a day (BID) | INTRAVENOUS | Status: AC
  Administered 2021-03-26 – 2021-03-27 (×2): 1000 mg via INTRAVENOUS
  Filled 2021-03-26 (×2): qty 200

## 2021-03-26 MED ORDER — HEPARIN SODIUM (PORCINE) 1000 UNIT/ML IJ SOLN
INTRAMUSCULAR | Status: DC | PRN
  Administered 2021-03-26: 7000 [IU] via INTRAVENOUS

## 2021-03-26 MED ORDER — POTASSIUM CHLORIDE CRYS ER 20 MEQ PO TBCR: 20.0000 meq | EXTENDED_RELEASE_TABLET | Freq: Every day | ORAL | Status: DC | PRN

## 2021-03-26 MED ORDER — FENTANYL CITRATE (PF) 250 MCG/5ML IJ SOLN
INTRAMUSCULAR | Status: DC | PRN
  Administered 2021-03-26: 50 ug via INTRAVENOUS

## 2021-03-26 MED ORDER — 0.9 % SODIUM CHLORIDE (POUR BTL) OPTIME
TOPICAL | Status: DC | PRN
  Administered 2021-03-26: 1000 mL

## 2021-03-26 MED ORDER — ROCURONIUM 10MG/ML (10ML) SYRINGE FOR MEDFUSION PUMP - OPTIME
INTRAVENOUS | Status: DC | PRN
  Administered 2021-03-26: 70 mg via INTRAVENOUS

## 2021-03-26 MED ORDER — HEPARIN 6000 UNIT IRRIGATION SOLUTION
Status: DC | PRN
  Administered 2021-03-26: 1

## 2021-03-26 MED ORDER — EPHEDRINE 5 MG/ML INJ
INTRAVENOUS | Status: AC
  Filled 2021-03-26: qty 5

## 2021-03-26 MED ORDER — PHENOL 1.4 % MT LIQD: 1.0000 | OROMUCOSAL | Status: DC | PRN

## 2021-03-26 MED ORDER — HEMOSTATIC AGENTS (NO CHARGE) OPTIME
TOPICAL | Status: DC | PRN
  Administered 2021-03-26: 1 via TOPICAL

## 2021-03-26 MED ORDER — ONDANSETRON HCL 4 MG/2ML IJ SOLN
INTRAMUSCULAR | Status: DC | PRN
  Administered 2021-03-26: 4 mg via INTRAVENOUS

## 2021-03-26 MED ORDER — LACTATED RINGERS IV SOLN: INTRAVENOUS | Status: DC | PRN

## 2021-03-26 SURGICAL SUPPLY — 68 items
ADH SKN CLS APL DERMABOND .7 (GAUZE/BANDAGES/DRESSINGS) ×2
ADH SKN CLS LQ APL DERMABOND (GAUZE/BANDAGES/DRESSINGS) ×3
AGENT HMST SPONGE THK3/8 (HEMOSTASIS)
BAG COUNTER SPONGE SURGICOUNT (BAG) ×2 IMPLANT
BAG SPNG CNTER NS LX DISP (BAG) ×1
BAG SURGICOUNT SPONGE COUNTING (BAG) ×1
BANDAGE ESMARK 6X9 LF (GAUZE/BANDAGES/DRESSINGS) ×1 IMPLANT
BLADE CLIPPER SURG (BLADE) ×3 IMPLANT
BNDG CMPR 9X6 STRL LF SNTH (GAUZE/BANDAGES/DRESSINGS) ×1
BNDG ESMARK 6X9 LF (GAUZE/BANDAGES/DRESSINGS) ×3
CANISTER SUCT 3000ML PPV (MISCELLANEOUS) ×3 IMPLANT
CLIP VESOCCLUDE MED 24/CT (CLIP) ×3 IMPLANT
CLIP VESOCCLUDE SM WIDE 24/CT (CLIP) ×3 IMPLANT
COVER PROBE W GEL 5X96 (DRAPES) ×3 IMPLANT
CUFF TOURN SGL QUICK 18X4 (TOURNIQUET CUFF) IMPLANT
CUFF TOURN SGL QUICK 24 (TOURNIQUET CUFF) ×3
CUFF TOURN SGL QUICK 34 (TOURNIQUET CUFF)
CUFF TOURN SGL QUICK 42 (TOURNIQUET CUFF) IMPLANT
CUFF TRNQT CYL 24X4X16.5-23 (TOURNIQUET CUFF) ×1 IMPLANT
CUFF TRNQT CYL 34X4.125X (TOURNIQUET CUFF) IMPLANT
DERMABOND ADHESIVE PROPEN (GAUZE/BANDAGES/DRESSINGS) ×6
DERMABOND ADVANCED (GAUZE/BANDAGES/DRESSINGS) ×4
DERMABOND ADVANCED .7 DNX12 (GAUZE/BANDAGES/DRESSINGS) ×2 IMPLANT
DERMABOND ADVANCED .7 DNX6 (GAUZE/BANDAGES/DRESSINGS) ×3 IMPLANT
DRAIN CHANNEL 15F RND FF W/TCR (WOUND CARE) IMPLANT
DRAPE C-ARM 42X72 X-RAY (DRAPES) IMPLANT
DRAPE HALF SHEET 40X57 (DRAPES) IMPLANT
ELECT REM PT RETURN 9FT ADLT (ELECTROSURGICAL) ×6
ELECTRODE REM PT RTRN 9FT ADLT (ELECTROSURGICAL) ×2 IMPLANT
EVACUATOR SILICONE 100CC (DRAIN) IMPLANT
GAUZE 4X4 16PLY ~~LOC~~+RFID DBL (SPONGE) ×9 IMPLANT
GLOVE SRG 8 PF TXTR STRL LF DI (GLOVE) ×1 IMPLANT
GLOVE SURG ENC MOIS LTX SZ7.5 (GLOVE) ×3 IMPLANT
GLOVE SURG POLYISO LF SZ6 (GLOVE) ×3 IMPLANT
GLOVE SURG UNDER POLY LF SZ6.5 (GLOVE) ×3 IMPLANT
GLOVE SURG UNDER POLY LF SZ7.5 (GLOVE) ×3 IMPLANT
GLOVE SURG UNDER POLY LF SZ8 (GLOVE) ×3
GOWN STRL REUS W/ TWL LRG LVL3 (GOWN DISPOSABLE) ×2 IMPLANT
GOWN STRL REUS W/ TWL XL LVL3 (GOWN DISPOSABLE) ×2 IMPLANT
GOWN STRL REUS W/TWL LRG LVL3 (GOWN DISPOSABLE) ×6
GOWN STRL REUS W/TWL XL LVL3 (GOWN DISPOSABLE) ×6
GRAFT PROPATEN W/RING 6X80X60 (Vascular Products) ×3 IMPLANT
HEMOSTAT SPONGE AVITENE ULTRA (HEMOSTASIS) IMPLANT
KIT BASIN OR (CUSTOM PROCEDURE TRAY) ×3 IMPLANT
KIT TURNOVER KIT B (KITS) ×3 IMPLANT
NS IRRIG 1000ML POUR BTL (IV SOLUTION) ×6 IMPLANT
PACK PERIPHERAL VASCULAR (CUSTOM PROCEDURE TRAY) ×3 IMPLANT
PAD ARMBOARD 7.5X6 YLW CONV (MISCELLANEOUS) ×6 IMPLANT
PENCIL BUTTON HOLSTER BLD 10FT (ELECTRODE) ×3 IMPLANT
POWDER SURGICEL 3.0 GRAM (HEMOSTASIS) ×3 IMPLANT
SPONGE T-LAP 18X18 ~~LOC~~+RFID (SPONGE) ×6 IMPLANT
STOPCOCK 4 WAY LG BORE MALE ST (IV SETS) IMPLANT
SUT MNCRL AB 4-0 PS2 18 (SUTURE) ×9 IMPLANT
SUT PROLENE 5 0 C 1 24 (SUTURE) ×3 IMPLANT
SUT PROLENE 6 0 BV (SUTURE) ×12 IMPLANT
SUT PROLENE 7 0 BV 1 (SUTURE) IMPLANT
SUT SILK 2 0 PERMA HAND 18 BK (SUTURE) IMPLANT
SUT SILK 2 0 SH (SUTURE) ×3 IMPLANT
SUT SILK 3 0 (SUTURE)
SUT SILK 3-0 18XBRD TIE 12 (SUTURE) IMPLANT
SUT VIC AB 2-0 CT1 27 (SUTURE) ×6
SUT VIC AB 2-0 CT1 TAPERPNT 27 (SUTURE) ×2 IMPLANT
SUT VIC AB 3-0 SH 27 (SUTURE) ×9
SUT VIC AB 3-0 SH 27X BRD (SUTURE) ×3 IMPLANT
TOWEL GREEN STERILE (TOWEL DISPOSABLE) ×3 IMPLANT
TUBING EXTENTION W/L.L. (IV SETS) IMPLANT
UNDERPAD 30X36 HEAVY ABSORB (UNDERPADS AND DIAPERS) ×3 IMPLANT
WATER STERILE IRR 1000ML POUR (IV SOLUTION) ×3 IMPLANT

## 2021-03-26 NOTE — Anesthesia Preprocedure Evaluation (Addendum)
Anesthesia Evaluation  Patient identified by MRN, date of birth, ID band Patient confused    Reviewed: Allergy & Precautions, NPO status , Patient's Chart, lab work & pertinent test results, Unable to perform ROS - Chart review only  Airway Mallampati: III  TM Distance: >3 FB Neck ROM: Full    Dental  (+) Poor Dentition   Pulmonary Current Smoker and Patient abstained from smoking.,     + decreased breath sounds      Cardiovascular hypertension, Pt. on medications + CAD, + Cardiac Stents, + CABG and + Peripheral Vascular Disease  + pacemaker + Cardiac Defibrillator  Rhythm:Irregular  1. Left ventricular ejection fraction, by estimation, is 30 to 35%. The  left ventricle has moderately decreased function. The left ventricle  demonstrates global hypokinesis. The left ventricular internal cavity size  was mildly dilated. Left ventricular  diastolic parameters are consistent with Grade I diastolic dysfunction  (impaired relaxation).  2. Right ventricular systolic function is normal. The right ventricular  size is normal. There is mildly elevated pulmonary artery systolic  pressure. The estimated right ventricular systolic pressure is 63.8 mmHg.  3. Left atrial size was moderately dilated.  4. Right atrial size was moderately dilated.  5. The mitral valve is normal in structure. Trivial mitral valve  regurgitation. No evidence of mitral stenosis.  6. Tricuspid valve regurgitation is moderate.  7. The aortic valve is tricuspid. Aortic valve regurgitation is not  visualized. Mild to moderate aortic valve sclerosis/calcification is  present, without any evidence of aortic stenosis.  8. The inferior vena cava is dilated in size with >50% respiratory  variability, suggesting right atrial pressure of 8 mmHg.    Neuro/Psych CVA, Residual Symptoms    GI/Hepatic negative GI ROS, Neg liver ROS,   Endo/Other  Lab Results       Component                Value               Date                      HGBA1C                   5.6                 12/16/2020             Renal/GU Lab Results      Component                Value               Date                      CREATININE               0.76                03/26/2021              Chronic indwelling Foley catheter  Neurogenic bladder    Musculoskeletal   Abdominal   Peds  Hematology  (+) Blood dyscrasia, anemia , Lab Results      Component                Value               Date  WBC                      4.5                 03/26/2021                HGB                      10.8 (L)            03/26/2021                HCT                      35.2 (L)            03/26/2021                MCV                      80.9                03/26/2021                PLT                      210                 03/26/2021              Anesthesia Other Findings CAD s/p posterior left-ventricular artery and circumflex stent (last cath 2014 with patent PLV stent, 40% in-stent stenosis of circumflex, and additional branch-vessel disease but no culprit lesions), negative nuclear stress 03/2019, ischemic cardiomyopathy with ICD, sick sinus syndrome (s/p pacemaker). EKG Findings include: A-pacing at 69bpm. 71mm ST-elevation in V1 with 32mm ST-elevation in V2. 28mm ST-depression in II and aVF with TWI in V4-V6. 17mm ST-depression in V4-V5. ST-elevation in V1-V2, TWI and ST-depression in V4-V6 is chronic. Only the ST-depression in II, III, and aVF is new from recent EKG's.   Reproductive/Obstetrics                            Anesthesia Physical Anesthesia Plan  ASA: 4  Anesthesia Plan: General   Post-op Pain Management:    Induction: Intravenous  PONV Risk Score and Plan: 1 and Ondansetron and Dexamethasone  Airway Management Planned: Oral ETT  Additional Equipment: Arterial line  Intra-op Plan:   Post-operative Plan:  Possible Post-op intubation/ventilation  Informed Consent:     Consent reviewed with POA  Plan Discussed with: CRNA and Anesthesiologist  Anesthesia Plan Comments: (Discussed care with granddaughter who agrees with GETA, full code, and blood transfusions if needed)        Anesthesia Quick Evaluation

## 2021-03-26 NOTE — Transfer of Care (Signed)
Immediate Anesthesia Transfer of Care Note  Patient: Todd Dunn  Procedure(s) Performed: BYPASS GRAFT FEMORAL-TIBIAL ARTERY.  Harvest right leg saphenous vein, right vein patch angioplasty posterior tibial. (Right)  Patient Location: PACU  Anesthesia Type:General  Level of Consciousness: drowsy, patient cooperative and responds to stimulation  Airway & Oxygen Therapy: Patient Spontanous Breathing and Patient connected to nasal cannula oxygen  Post-op Assessment: Report given to RN, Post -op Vital signs reviewed and stable and Patient moving all extremities X 4  Post vital signs: Reviewed and stable  Last Vitals:  Vitals Value Taken Time  BP 140/68 03/26/21 1114  Temp    Pulse    Resp 18 03/26/21 1115  SpO2    Vitals shown include unvalidated device data.  Last Pain:  Vitals:   03/26/21 0754  TempSrc: Oral  PainSc:       Patients Stated Pain Goal: 0 (93/23/55 7322)  Complications: No notable events documented.

## 2021-03-26 NOTE — Progress Notes (Signed)
[facility]  Subjective  -patient remains confused and disoriented   Objective (!) 147/69 70 98.3 F (36.8 C) (Axillary) 14 97%  Intake/Output Summary (Last 24 hours) at 03/26/2021 0733 Last data filed at 03/26/2021 6712 Gross per 24 hour  Intake 680 ml  Output 2025 ml  Net -1345 ml    Right great toe dry gangrene  Laboratory Lab Results: Recent Labs    03/25/21 0314 03/26/21 0336  WBC 5.3 4.5  HGB 11.0* 10.8*  HCT 35.1* 35.2*  PLT 201 210   BMET Recent Labs    03/25/21 0314 03/26/21 0336  NA 129* 136  K 3.4* 4.3  CL 98 101  CO2 26 28  GLUCOSE 123* 113*  BUN 8 11  CREATININE 0.76 0.76  CALCIUM 9.6 9.9    COAG Lab Results  Component Value Date   INR 1.1 02/14/2021   INR 1.1 01/21/2021   INR 1.2 09/15/2020   No results found for: PTT  Assessment/Planning:  80 year old male with 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  We have had multiple conversations with the family throughout the week about options moving forward.  I have again offered palliative wound care with ultimate more proximal amputation given his risk factors and comorbidities.  He was evaluated by cardiology the quoted him 11% risk of major perioperative complication from a cardiac standpoint.  I relayed all this information to family.  Ultimately they have discussed that they are not interested in amputation and they want to try the bypass.  I discussed this would be a plastic bypass graft to a tibial which likely has poor durability.  I have discussed all the other risk factors.  They wish to proceed.  Marty Heck 03/26/2021 7:33 AM --

## 2021-03-26 NOTE — Anesthesia Postprocedure Evaluation (Signed)
Anesthesia Post Note  Patient: Todd Dunn  Procedure(s) Performed: BYPASS GRAFT FEMORAL-TIBIAL ARTERY.  Harvest right leg saphenous vein, right vein patch angioplasty posterior tibial. (Right)     Patient location during evaluation: PACU Anesthesia Type: General Level of consciousness: awake and confused Pain management: pain level controlled Vital Signs Assessment: post-procedure vital signs reviewed and stable Respiratory status: spontaneous breathing, nonlabored ventilation, respiratory function stable and patient connected to nasal cannula oxygen Cardiovascular status: blood pressure returned to baseline and stable Postop Assessment: no apparent nausea or vomiting Anesthetic complications: no   No notable events documented.  Last Vitals:  Vitals:   03/26/21 1540 03/26/21 1700  BP: (!) 147/81 128/70  Pulse:  91  Resp: 17 15  Temp: 36.6 C 36.6 C  SpO2: 100%     Last Pain:  Vitals:   03/26/21 1700  TempSrc: Axillary  PainSc:                  Pearle Wandler

## 2021-03-26 NOTE — Progress Notes (Signed)
Informed Dr. Unice Bailey that grand daughter is undecided about the procedure and did not sign the consent. Dr Virl Cagey is aware and he ordered to bring down the patient to the OR. He also mentioned that he will call the relative again to get the consent. Relayed the information to [place] (OR staff). Informed charge nurse.

## 2021-03-26 NOTE — Op Note (Signed)
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  Harvest of right leg great saphenous vein 2.  Vein patch angioplasty of the right posterior tibial artery in the mid calf with great saphenous vein 3.  Right common femoral artery to posterior tibial artery bypass using 6 mm ringed PTFE  Surgeon: Dr. Marty Heck, MD  Assistant: Dr. [name], MD and Paulo Fruit, [place]  Indications: Patient is a 80 year old male who was seen over the weekend with right lower extremity tissue loss with severely depressed ABI of 0.36.  He underwent arteriogram and ultimately needed a tibial bypass to the posterior tibial artery.  He had no usable long segment saphenous vein.  We discussed risk and benefits including using prosthetic graft with the family and they elected to proceed even though we gave them alternative options of wound care with ultimate primary amputation given his comorbidities.  An assistant was needed for exposure and to expedite the case.  Findings: There was no usable long segment saphenous vein.  A small segment of saphenous vein was harvested near the saphenofemoral junction for vein patch angioplasty on the posterior tibial artery.  The posterior tibial artery was of good caliber but was calcified and we found one soft spot in the mid calf for anastomosis.  After vein patch angioplasty of the posterior tibial a 6 mm ringed PTFE graft was sewn from the common femoral to the PT and tunneled subfascial subsartorial.  Excellent PT signal at completion.  Anesthesia: General  Details: Patient was taken to the operating room after informed consent was obtained.  Placed on operative table supine position.  General endotracheal anesthesia was induces.  Antibiotics were given.  Timeout was performed.  Ultimately my partner Dr. Donzetta Matters started in the right  groin and dissected out the common femoral artery after opening the femoral sheath with a transverse groin incision.  This was fairly calcified but did appear soft enough for anastomosis on the anterior wall proximally.  I started on the right medial calf and initially had planned to harvest a segment of saphenous vein here for a composite graft but the vein was small and not usable.  I then took down the soleus and gastrocnemius off the tibia and found the posterior tibial artery in the mid calf where it reconstituted on arteriogram.  This was dissected out and controlled with vessel loops.  This was of excellent caliber although was fairly calcified.  I then entered the popliteal space to create a tunnel and passed the tunneler from the below-knee popliteal space up subfascial subsartorial to the groin.  A 6 mm ringed PTFE graft was brought in the field and tunneled anatomic.  Patient was then given 100 units/kg IV heparin and ACT was checked to maintain greater than 250.  Dr. Donzetta Matters sewed the proximal bypass in the right common femoral artery after controlling this with a Henley clamp and Vesseloops and opening the common femoral with 11 blade scalpel extended Potts scissors.  The proximal anastomosis was sewn with 5-0 Prolene parachute technique end to side.  While he was doing that I then opened the posterior tibial artery in the mid calf where there was a soft spot after it was controlled with vessel loop.  I then brought the short segment of saphenous vein that was harvested and this was spatulated open with potts scissors and then I  sewed a vein patch angioplasty to the posterior tibial artery with 6-0 Prolene parachute technique.  Once the proximal anastomosis was done we had excellent inflow in the bypass graft.  The leg was then straightened and the graft was cut to the appropriate length and spatulated and end-to-side anastomosis was sewn to the patch on the posterior tibial artery with a 6-0 Prolene  parachute technique.  We did de-air the graft prior to completion.  Patient was then given protamine for reversal after we had excellent posterior tibial signal at the ankle.  All the wounds were irrigated and we used Surgicel powder for hemostasis.  The wounds were closed in multiple layers of 2-0 Vicryl, 3-0 Vicryl, 4-0 Monocryl Dermabond.  Awakened and taken to PACU in stable condition.  Complication: None  Condition: Stable  Marty Heck, MD [facility]: [facility]

## 2021-03-26 NOTE — Anesthesia Procedure Notes (Signed)
Procedure Name: Intubation Date/Time: 03/26/2021 8:22 AM Performed by: Claris Che, CRNA Pre-anesthesia Checklist: Patient identified, Emergency Drugs available, Suction available, Patient being monitored and Timeout performed Patient Re-evaluated:Patient Re-evaluated prior to induction Oxygen Delivery Method: Circle system utilized Preoxygenation: Pre-oxygenation with 100% oxygen Induction Type: IV induction and Cricoid Pressure applied Ventilation: Mask ventilation without difficulty Laryngoscope Size: Mac and 4 Grade View: Grade II Tube type: Oral Tube size: 7.5 mm Number of attempts: 1 Airway Equipment and Method: Stylet Placement Confirmation: ETT inserted through vocal cords under direct vision, positive ETCO2 and breath sounds checked- equal and bilateral Secured at: 24 cm Tube secured with: Tape Dental Injury: Teeth and Oropharynx as per pre-operative assessment

## 2021-03-26 NOTE — Anesthesia Procedure Notes (Signed)
Arterial Line Insertion Start/End10/20/2022 8:30 AM, 03/26/2021 8:32 AM Performed by: Oleta Mouse, MD, anesthesiologist  Patient location: OR. Lidocaine 1% used for infiltration radial was placed Catheter size: 20 G Maximum sterile barriers used   Attempts: 1 Procedure performed without using ultrasound guided technique. Following insertion, dressing applied.

## 2021-03-26 NOTE — Progress Notes (Signed)
PROGRESS NOTE  Todd Dunn  DOB: [birth year]  PCP: Fredrich Romans, [place] GLO:[id number]  DOA: 03/20/2021  LOS: 6 days  Hospital Day: 7   Chief Complaint  Patient presents with   Wound Check   Toe Pain   Wound Infection    Brief narrative: Todd Dunn is a 80 y.o. male with PMH significant for history of CAD/CABG, ppm for SSS, ICM, CVA with residual left hemiplegia, dysphagia, and dysarthria, neurogenic bladder with chronic Foley, recurrent UTIs and also with SSS s/p pacemaker who lives at home with his granddaughter. Patient was brought to the ED on 10/14 with 2 weeks history of progressive left great toe infection without any antecedent trauma or ulcer.  X-ray right foot concerning for osteomyelitis of the lateral tuft.   Patient was started on cefepime and linezolid. Wound swab culture was sent.. Admitted to hospitalist service Evaluated by orthopedics and vascular surgery. See below for details  Subjective: Patient was seen and examined this afternoon. Alert, awake, unclear speech at baseline. Not in pain. Underwent vascular procedure this morning..    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 -WBC count normal, lactate level normal.  No fever. -Wound culture grew staph aureus. -Currently patient is on IV Ancef Recent Labs  Lab 03/20/21 0901 03/20/21 1101 03/21/21 0250 03/23/21 0201 03/24/21 0224 03/25/21 0314 03/26/21 0336  WBC 5.7  --  4.8 5.6 5.4 5.3 4.5  LATICACIDVEN 1.0 1.0  --   --   --   --   --    Critical limb ischemia of right lower extremity Peripheral artery disease -Because of the right great toe wound, gangrene, peripheral artery disease was suspected, vascular surgery consult was obtained. -10/17, patient underwent aortogram and right lower extremity angiography.  Per report, heavily calcified aorta with patent renal arteries,  severely decreased flow, occluded right SFA.  -On 10/20, patient underwent angioplasty of the right posterior tibial artery and bypass of the right common femoral artery to posterior tibial artery with prosthetic graft. -Continue Plavix, statin after his CVA  Ischemic cardiomyopathy with EF 30 to 35% Essential hypertension -Currently heart failure seems compensated. -Prior to admission patient was on Lasix, Imdur, lisinopril, hydralazine and amlodipine -Currently he is on Imdur, hydralazine and amlodipine. Lasix and lisinopril are on hold.   -Cardiology following. -Hemodynamically stable  CAD/CABG in 2014 -Currently has no chest pain.  Seen by cardiology.  Continue Plavix, Imdur, statin.  History of SSS s/p pacemaker -currently paced rhythm  History of CVA with residual left hemiplegia, dysarthria, dysphagia -Continue statin and Plavix -Baseline wheelchair-bound, lives at home  UTI related to chronic Foley History of recurrent UTI Neurogenic bladder /chronic indwelling Foley -Urine culture sent on admission grew 70,000 colonies per mL of Proteus mirabilis. -Already on antibiotics.   -Foley changed on 10/14 in the ED. -Continue Proscar and Flomax   Acute metabolic encephalopathy -Intermittent restlessness, agitation.  Currently stable. -Per granddaughter, patient tends to have these episodes when he gets UTI.  She states that his stroke was several years ago and did not leave any residual cognitive deficit or change in mental status. -Continue to monitor mental status change  Depression/anxiety -Celexa 20 mg at bedtime   Mobility: PT eval postprocedure Code Status:   Code Status: Full Code  Nutritional status: Body mass index is 18.09 kg/m. Nutrition Problem: Increased nutrient needs Etiology: wound healing Signs/Symptoms: estimated needs Diet:  Diet  Order             Diet NPO time specified  Diet effective now                  DVT prophylaxis:  SCD's Start:  03/26/21 1347 enoxaparin (LOVENOX) injection 40 mg Start: 03/20/21 1600   Antimicrobials: IV Ancef Fluid: None Consultants: Vascular surgery, orthopedics Family Communication: None at bedside  Status is: Inpatient  Remains inpatient appropriate because: POD 0  Dispo: The patient is from: Home              Anticipated d/c is to: Home versus SNF.  Pending PT eval              Patient currently is not medically stable to d/c.   Difficult to place patient No     Infusions:   sodium chloride 250 mL (03/22/21 1610)   sodium chloride     sodium chloride     sodium chloride      ceFAZolin (ANCEF) IV 2 g (03/26/21 1540)   magnesium sulfate bolus IVPB     vancomycin      Scheduled Meds:  amLODipine  10 mg Oral Daily   Chlorhexidine Gluconate Cloth  6 each Topical Daily   clopidogrel  75 mg Oral QHS   [START ON 03/27/2021] docusate sodium  100 mg Oral Daily   enoxaparin (LOVENOX) injection  40 mg Subcutaneous Q24H   famotidine  10 mg Oral Daily   feeding supplement  237 mL Oral TID BM   finasteride  5 mg Oral Daily   hydrALAZINE  50 mg Oral BID   isosorbide mononitrate  60 mg Oral Daily    morphine injection  2 mg Intravenous Once   multivitamin with minerals  1 tablet Oral Daily   pantoprazole  40 mg Oral Daily   rosuvastatin  10 mg Oral Daily   senna-docusate  1 tablet Oral BID   sodium chloride flush  3 mL Intravenous Q12H   sodium chloride flush  3 mL Intravenous Q12H   tamsulosin  0.4 mg Oral Daily    Antimicrobials: Anti-infectives (From admission, onward)    Start     Dose/Rate Route Frequency Ordered Stop   03/26/21 1600  vancomycin (VANCOCIN) IVPB 1000 mg/200 mL premix        1,000 mg 200 mL/hr over 60 Minutes Intravenous Every 12 hours 03/26/21 1346 03/27/21 1559   03/24/21 1415  ceFAZolin (ANCEF) IVPB 2g/100 mL premix        2 g 200 mL/hr over 30 Minutes Intravenous Every 8 hours 03/24/21 1329     03/20/21 2215  linezolid (ZYVOX) IVPB 600 mg  Status:   Discontinued        600 mg 300 mL/hr over 60 Minutes Intravenous Every 12 hours 03/20/21 2208 03/24/21 1329   03/20/21 2215  ceFEPIme (MAXIPIME) 2 g in sodium chloride 0.9 % 100 mL IVPB  Status:  Discontinued        2 g 200 mL/hr over 30 Minutes Intravenous Every 8 hours 03/20/21 2208 03/24/21 1329   03/20/21 1315  clindamycin (CLEOCIN) capsule 300 mg  Status:  Discontinued        300 mg Oral  Once 03/20/21 1306 03/20/21 1338       PRN meds: sodium chloride, sodium chloride, sodium chloride, acetaminophen **OR** acetaminophen, albuterol, alum & mag hydroxide-simeth, guaiFENesin-dextromethorphan, hydrALAZINE, HYDROcodone-acetaminophen, labetalol, magnesium sulfate bolus IVPB, ondansetron **OR** ondansetron (ZOFRAN) IV, phenol, potassium chloride, sodium chloride flush  Objective: Vitals:   03/26/21 1351 03/26/21 1540  BP: 126/83 (!) 147/81  Pulse:    Resp: 20 17  Temp:  97.8 F (36.6 C)  SpO2: 100% 100%    Intake/Output Summary (Last 24 hours) at 03/26/2021 1556 Last data filed at 03/26/2021 1323 Gross per 24 hour  Intake 2380 ml  Output 1850 ml  Net 530 ml    Filed Weights   03/21/21 0006 03/26/21 0754  Weight: 62.2 kg 62.2 kg   Weight change:  Body mass index is 18.09 kg/m.   Physical Exam: General exam: Ederly African-American male.  Not in pain skin: No rashes, lesions or ulcers. HEENT: Atraumatic, normocephalic, no obvious bleeding Lungs: Clear to auscultation bilaterally CVS: Regular rate and rhythm, no murmur GI/Abd soft, nontender, nondistended, bowel sound present CNS: Alert, awake, unclear voice at baseline. psychiatry: Mood appropriate Extremities: No pedal edema, no calf tenderness, left toenail avulsion and infection  Data Review: I have personally reviewed the laboratory data and studies available.  Recent Labs  Lab 03/20/21 0901 03/21/21 0250 03/23/21 0201 03/24/21 0224 03/25/21 0314 03/26/21 0336  WBC 5.7 4.8 5.6 5.4 5.3 4.5  NEUTROABS  3.8  --   --  3.3 3.3 2.5  HGB 10.4* 10.7* 9.2* 10.0* 11.0* 10.8*  HCT 35.3* 34.2* 29.5* 33.4* 35.1* 35.2*  MCV 83.6 80.1 80.4 83.9 80.3 80.9  PLT 229 202 166 175 201 210    Recent Labs  Lab 03/21/21 0250 03/23/21 0201 03/24/21 0224 03/25/21 0314 03/26/21 0336  NA 135 135 137 129* 136  K 4.1 4.1 4.0 3.4* 4.3  CL 104 106 107 98 101  CO2 26 25 22 26 28   GLUCOSE 104* 110* 117* 123* 113*  BUN 16 13 9 8 11   CREATININE 0.65 0.78 0.73 0.76 0.76  CALCIUM 9.5 9.4 9.3 9.6 9.9     F/u labs ordered Unresulted Labs (From admission, onward)     Start     Ordered   03/27/21 0500  Lipid panel  (Labs - [facility] only)  Tomorrow morning,   R       Question:  Specimen collection method  Answer:  Lab=Lab collect   03/26/21 1346   03/27/21 0000  CBC  Tomorrow morning,   R       Question:  Specimen collection method  Answer:  Lab=Lab collect   03/26/21 1346   03/27/21 3354  Basic metabolic panel  Tomorrow morning,   R       Question:  Specimen collection method  Answer:  Lab=Lab collect   03/26/21 1346            Signed, Terrilee Croak, MD [facility] 03/26/2021

## 2021-03-27 ENCOUNTER — Encounter (HOSPITAL_COMMUNITY): Payer: Self-pay | Admitting: Vascular Surgery

## 2021-03-27 DIAGNOSIS — L089 Local infection of the skin and subcutaneous tissue, unspecified: Secondary | ICD-10-CM | POA: Diagnosis not present

## 2021-03-27 DIAGNOSIS — D500 Iron deficiency anemia secondary to blood loss (chronic): Secondary | ICD-10-CM

## 2021-03-27 DIAGNOSIS — T148XXA Other injury of unspecified body region, initial encounter: Secondary | ICD-10-CM | POA: Diagnosis not present

## 2021-03-27 DIAGNOSIS — Z95828 Presence of other vascular implants and grafts: Secondary | ICD-10-CM

## 2021-03-27 DIAGNOSIS — D62 Acute posthemorrhagic anemia: Secondary | ICD-10-CM

## 2021-03-27 DIAGNOSIS — I70261 Atherosclerosis of native arteries of extremities with gangrene, right leg: Secondary | ICD-10-CM

## 2021-03-27 DIAGNOSIS — E43 Unspecified severe protein-calorie malnutrition: Secondary | ICD-10-CM | POA: Insufficient documentation

## 2021-03-27 LAB — BASIC METABOLIC PANEL
Anion gap: 6 (ref 5–15)
BUN: 13 mg/dL (ref 8–23)
CO2: 26 mmol/L (ref 22–32)
Calcium: 9.7 mg/dL (ref 8.9–10.3)
Chloride: 104 mmol/L (ref 98–111)
Creatinine, Ser: 0.74 mg/dL (ref 0.61–1.24)
GFR, Estimated: >60 mL/min (ref >60)
Glucose, Bld: 97 mg/dL (ref 70–99)
Potassium: 4.7 mmol/L (ref 3.5–5.1)
Sodium: 136 mmol/L (ref 135–145)

## 2021-03-27 LAB — LIPID PANEL
Cholesterol: 111 mg/dL (ref 0–200)
HDL: 52 mg/dL (ref >40)
LDL Cholesterol: 49 mg/dL (ref 0–99)
Total CHOL/HDL Ratio: 2.1 RATIO
Triglycerides: 52 mg/dL (ref <150)
VLDL: 10 mg/dL (ref 0–40)

## 2021-03-27 LAB — CBC
HCT: 29.2 % — ABNORMAL LOW (ref 39.0–52.0)
Hemoglobin: 9 g/dL — ABNORMAL LOW (ref 13.0–17.0)
MCH: 24.8 pg — ABNORMAL LOW (ref 26.0–34.0)
MCHC: 30.8 g/dL (ref 30.0–36.0)
MCV: 80.4 fL (ref 80.0–100.0)
Platelets: 209 10*3/uL (ref 150–400)
RBC: 3.63 MIL/uL — ABNORMAL LOW (ref 4.22–5.81)
RDW: 17 % — ABNORMAL HIGH (ref 11.5–15.5)
WBC: 6.4 10*3/uL (ref 4.0–10.5)
nRBC: 0 % (ref 0.0–0.2)

## 2021-03-27 LAB — GLUCOSE, CAPILLARY: Glucose-Capillary: 127 mg/dL — ABNORMAL HIGH (ref 70–99)

## 2021-03-27 MED ORDER — LORAZEPAM 2 MG/ML IJ SOLN: 0.5000 mg | Freq: Once | INTRAMUSCULAR | Status: DC | PRN

## 2021-03-27 NOTE — Progress Notes (Signed)
PHARMACIST LIPID MONITORING   Todd Dunn is a 80 y.o. male admitted on 03/20/2021 with limb ischemia.  Pharmacy has been consulted to optimize lipid-lowering therapy with the indication of secondary prevention for clinical ASCVD.  Recent Labs:  Lipid Panel (last 6 months):   Lab Results  Component Value Date   CHOL 111 03/27/2021   TRIG 52 03/27/2021   HDL 52 03/27/2021   CHOLHDL 2.1 03/27/2021   VLDL 10 03/27/2021   LDLCALC 49 03/27/2021    Hepatic function panel (last 6 months):   Lab Results  Component Value Date   AST 17 03/21/2021   ALT 12 03/21/2021   ALKPHOS 148 (H) 03/21/2021   BILITOT 0.7 03/21/2021    SCr (since admission):   Serum creatinine: 0.74 mg/dL 03/27/21 0038 Estimated creatinine clearance: 63.7 mL/min  Current therapy and lipid therapy tolerance Current lipid-lowering therapy: rosuvastatin 10mg   Previous lipid-lowering therapies (if applicable): none Documented or reported allergies or intolerances to lipid-lowering therapies (if applicable): none  Assessment:   Patient is at/below goal for recommended cholesterol levels, no changes to current lipid-lowering therapy is needed at this time.  Plan:    1.Statin intensity (high intensity recommended for all patients regardless of the LDL):  No statin changes. The patient is already on a high intensity statin.  2.Add ezetimibe (if any one of the following):   Not indicated at this time.  3.Refer to lipid clinic:   No  4.Follow-up with:  Primary care provider - Fredrich Romans, [place]  5.Follow-up labs after discharge:  No changes in lipid therapy, repeat a lipid panel in one year.      Thank you for allowing pharmacy to be a part of this patient's care.  Ardyth Harps, PharmD Clinical Pharmacist

## 2021-03-27 NOTE — Evaluation (Addendum)
 Occupational Therapy Evaluation Patient Details Name: Todd Dunn MRN: [id number] DOB: 03/08/1941 Today's Date: 03/27/2021   History of Present Illness Pt is an 80 y.o. male admitted 03/20/21 with partial avulsion of R great toenail. S/p abdominal aortogram 10/15. S/p RLE angiography, cannulation L femoral artery 10/17. S/p angioplasty, R femoral to posterior tibial artery bypass on 10/20. PMH includes CVA (residual L-side weakness, CAD, pacemaker, HTN, neurogenic bladder (chronic indwelling foley catheter).   Clinical Impression   Pt presents to acute OT with deficits in cognition, strength, standing balance, R LE pain and overall safety awareness. Unsure of true PLOF as pt unable to provide reliable history. Pt impulsive and restless throughout session, requiring frequent cues to attend to tasks and avoid pulling lines. Pt overall Min A for bed mobility, Mod A x 2 for sit to stand attempts with pt impulsively lifting R LE off of ground. Deferred further mobility attempts for safety today. Due to cognition and impaired safety awareness, pt requires overall Total A for ADLs, including self feeding. Based on presentation, recommend SNF rehab and 24/7 supervision/assist at discharge. Will continue to follow acutely and update recs as appropriate.      Recommendations for follow up therapy are one component of a multi-disciplinary discharge planning process, led by the attending physician.  Recommendations may be updated based on patient status, additional functional criteria and insurance authorization.   Follow Up Recommendations  SNF;Supervision/Assistance - 24 hour    Equipment Recommendations  Other (comment) (to be determined, pending progress)    Recommendations for Other Services       Precautions / Restrictions Precautions Precautions: Fall;Other (comment) Precaution Comments: impulsive Restrictions Weight Bearing Restrictions: No      Mobility Bed Mobility Overal bed  mobility: Needs Assistance Bed Mobility: Supine to Sit;Sit to Supine     Supine to sit: Supervision;HOB elevated Sit to supine: Min assist   General bed mobility comments: Supervision with increased time to sit EOB, frequent cues needed for safety. Min A to get R LE back into bed    Transfers Overall transfer level: Needs assistance Equipment used: Rolling walker (2 wheeled) Transfers: Sit to/from Stand Sit to Stand: Mod assist;+2 physical assistance;+2 safety/equipment;From elevated surface         General transfer comment: With initial sit to stand trial at bedside with RW, pt pushing/pulling on RW and attempted to stand with B LEs crossed. With second attempt, pt able to stand with Mod A x2 for elevated bed - mulitmodal cues for hand placement on RW and then pt suddenly lifting R LE up off of ground and attempting to prop on bed. Unable to safely attempt mobility away from bed this AM    Balance Overall balance assessment: Needs assistance Sitting-balance support: Feet supported;No upper extremity supported Sitting balance-Leahy Scale: Fair     Standing balance support: Bilateral upper extremity supported;During functional activity Standing balance-Leahy Scale: Poor Standing balance comment: reliant on UE support + external support                           ADL either performed or assessed with clinical judgement   ADL Overall ADL's : Needs assistance/impaired Eating/Feeding: Total assistance;Bed level Eating/Feeding Details (indicate cue type and reason): due to mitts, would not allow therapists to remove for breakfast and pt reports he would feed himself with them on once therapists left room. Grandson entering with breakfast food as well, relayed to NT on feeding assist needed  and safety concerns with mitts off Grooming: Total assistance;Bed level   Upper Body Bathing: Total assistance   Lower Body Bathing: Total assistance   Upper Body Dressing : Total  assistance   Lower Body Dressing: Total assistance Lower Body Dressing Details (indicate cue type and reason): donning socks in bed, unable to attend to task     Toileting- Clothing Manipulation and Hygiene: Total assistance;Bed level         General ADL Comments: Pt limited greatly by cognition and inability to attend to tasks with impulsive behavior throughout. Pt presents as high fall risk requiring overall Total A for ADLs due to cognition today     Vision Ability to See in Adequate Light: 0 Adequate Patient Visual Report: No change from baseline Vision Assessment?: No apparent visual deficits     Perception     Praxis      Pertinent Vitals/Pain Pain Assessment: Faces Faces Pain Scale: Hurts little more Pain Location: R LE Pain Descriptors / Indicators: Grimacing;Guarding;Other (Comment) (lifting up off of bed) Pain Intervention(s): Monitored during session;Limited activity within patient's tolerance     Hand Dominance Right   Extremity/Trunk Assessment Upper Extremity Assessment Upper Extremity Assessment: Generalized weakness   Lower Extremity Assessment Lower Extremity Assessment: Defer to PT evaluation   Cervical / Trunk Assessment Cervical / Trunk Assessment: Kyphotic   Communication Communication Communication: Expressive difficulties   Cognition Arousal/Alertness: Awake/alert Behavior During Therapy: Restless;Impulsive Overall Cognitive Status: No family/caregiver present to determine baseline cognitive functioning Area of Impairment: Orientation;Attention;Memory;Following commands;Safety/judgement;Problem solving;Awareness                 Orientation Level: Disoriented to;Time;Place Current Attention Level: Focused Memory: Decreased short-term memory Following Commands: Follows one step commands inconsistently;Follows one step commands with increased time Safety/Judgement: Decreased awareness of safety;Decreased awareness of  deficits Awareness: Intellectual Problem Solving: Slow processing;Requires verbal cues;Requires tactile cues General Comments: Pt very impulsive and restless, often quickly lifting R LE up in air (whether in bed or in standing to relieve pressure). Pt pulling at lines, unable to safely be directed in OOB activities. tangential conversation, does report he is at hospital (initially reports psych) and did say "yes" when asked if he had surgery and able to state surgery on R foot   General Comments  Grandson entering at end of session, attempted to gain some insight into PLOF/home setup. Educated that pt needs assist with self feeding, supervision if mitts off to ensure safety as grandson noted to remove mitts. During session, a tooth also was found in bed under pt - RN aware    Exercises     Shoulder Instructions      Home Living Family/patient expects to be discharged to:: Private residence Living Arrangements: Other relatives (granddaughter) Available Help at Discharge: Family Type of Home: House Home Access: Stairs to enter Technical  of Steps: 2   Home Layout: One level     Bathroom Shower/Tub: Tub/shower unit;Curtain;Walk-in shower   Bathroom Toilet: Standard Bathroom Accessibility: Yes   Home Equipment: Walker - 2 wheels;Wheelchair - manual;Grab bars - tub/shower   Additional Comments: info obtained from previous admission. Grandson entering during session and reports pt lives with a granddaughter      Prior Functioning/Environment Level of Independence: Needs assistance  Gait / Transfers Assistance Needed: Pt has a w/c but has been working with HHPT and progressed to walking with RW and min guard. Reports even able to get his L foot out front (used to drag).  Granddaughter states  excellent improvement with HHPT ADL's / Homemaking Assistance Needed: family assists with bathing and dressing Communication / Swallowing Assistance Needed: dysarthric Comments: PLOF  obtained from previous admission 9/22. Pt unable to provide any reliable hx today        OT Problem List: Decreased strength;Decreased activity tolerance;Impaired balance (sitting and/or standing);Decreased cognition;Decreased safety awareness;Pain      OT Treatment/Interventions: Self-care/ADL training;Therapeutic exercise;DME and/or AE instruction;Energy conservation;Therapeutic activities;Patient/family education;Balance training    OT Goals(Current goals can be found in the care plan section) Acute Rehab OT Goals Patient Stated Goal: eat some breakfast OT Goal Formulation: With patient Time For Goal Achievement: 04/10/21 Potential to Achieve Goals: Fair  OT Frequency: Min 2X/week   Barriers to D/C:            Co-evaluation              AM-PAC OT "6 Clicks" Daily Activity     Outcome Measure Help from another person eating meals?: Total Help from another person taking care of personal grooming?: Total Help from another person toileting, which includes using toliet, bedpan, or urinal?: Total Help from another person bathing (including washing, rinsing, drying)?: Total Help from another person to put on and taking off regular upper body clothing?: Total Help from another person to put on and taking off regular lower body clothing?: Total 6 Click Score: 6   End of Session Equipment Utilized During Treatment: Gait belt;Rolling walker Nurse Communication: Mobility status;Other (comment) (tooth found in bed, behaviors, assist for self feeding and grandson in room removing mitts for breakfast)  Activity Tolerance: Other (comment) (limited by cognition) Patient left: in bed;with call bell/phone within reach;with bed alarm set;with family/visitor present;Other (comment) (with soft mitts applied)  OT Visit Diagnosis: Unsteadiness on feet (R26.81);Other abnormalities of gait and mobility (R26.89);Muscle weakness (generalized) (M62.81);Other symptoms and signs involving cognitive  function;Pain Pain - Right/Left: Right Pain - part of body: Leg;Ankle and joints of foot                Time: 2500-3704 OT Time Calculation (min): 25 min Charges:  OT General Charges $OT Visit: 1 Visit OT Evaluation $OT Eval Moderate Complexity: 1 Mod  Malachy Chamber, OTR/L Acute Rehab Services Office: [phone]   Layla Maw 03/27/2021, 10:52 AM

## 2021-03-27 NOTE — Progress Notes (Addendum)
VASCULAR SURGERY ASSESSMENT & PLAN:   PAD/Critical limb ischemia of the right lower extremity with dry gangrene of the right great toe POD 1 Right common femoral artery to posterior tibial artery bypass using 6 mm ringed PTFE, vein patch angioplasty of right posterior tibial artery with saphenous vein.  Brisk right pedal Doppler signals this morning.  VSS. Afebrile. Right great toe with dry gangrene, stable. On Plavix and statin  Chronic anemia atop acute blood loss: Hemoglobin 9 g/dL.  No active bleeding.  Metabolic encephalopathy  DVT prophy: Lovenox SUBJECTIVE:   Awake, appears mildly agitated and holding up right leg  PHYSICAL EXAM:   Vitals:   03/26/21 2000 03/26/21 2348 03/27/21 0335 03/27/21 0700  BP: 134/68 (!) 124/55 (!) 133/49 128/63  Pulse: 90 82 79 70  Resp: 17 19 16 16   Temp: 97.7 F (36.5 C) 98.2 F (36.8 C) 98.3 F (36.8 C) 98.4 F (36.9 C)  TempSrc: Oral Oral Oral Oral  SpO2: 100% 100% 99% 100%  Weight:      Height:       General appearance: Awake, alert.  Not oriented Cardiac: Heart rate regular, rhythm irregular Respirations: Nonlabored Incisions: Right groin, thigh and lower leg incisions are all well approximated without bleeding or hematoma Extremities: Both feet are warm.  Ischemic changes noted and unchanged. Pulse/Doppler exam: Brisk right dorsalis pedis, posterior tibial and peroneal artery Doppler signals. + Left dorsalis pedis Doppler signal  LABS:   Lab Results  Component Value Date   WBC 6.4 03/27/2021   HGB 9.0 (L) 03/27/2021   HCT 29.2 (L) 03/27/2021   MCV 80.4 03/27/2021   PLT 209 03/27/2021   Lab Results  Component Value Date   CREATININE 0.74 03/27/2021   Lab Results  Component Value Date   INR 1.1 02/14/2021   CBG (last 3)  Recent Labs    03/26/21 2121 03/27/21 0619  GLUCAP 98 127*    PROBLEM LIST:    Principal Problem:   Wound infection Active Problems:   Catheter-associated urinary tract infection (HCC)    Chronic indwelling Foley catheter   HTN (hypertension)   Osteomyelitis (HCC)   Pacemaker   Elevated alkaline phosphatase level   Hyperlipidemia   Toe infection   PVD (peripheral vascular disease) (HCC)   CURRENT MEDS:    amLODipine  10 mg Oral Daily   Chlorhexidine Gluconate Cloth  6 each Topical Daily   clopidogrel  75 mg Oral QHS   docusate sodium  100 mg Oral Daily   enoxaparin (LOVENOX) injection  40 mg Subcutaneous Q24H   famotidine  10 mg Oral Daily   feeding supplement  237 mL Oral TID BM   finasteride  5 mg Oral Daily   hydrALAZINE  50 mg Oral BID   isosorbide mononitrate  60 mg Oral Daily    morphine injection  2 mg Intravenous Once   multivitamin with minerals  1 tablet Oral Daily   pantoprazole  40 mg Oral Daily   rosuvastatin  10 mg Oral Daily   senna-docusate  1 tablet Oral BID   sodium chloride flush  3 mL Intravenous Q12H   sodium chloride flush  3 mL Intravenous Q12H   tamsulosin  0.4 mg Oral Daily   Barbie Banner, PA-C  Office: [phone] 03/27/2021   I have seen and evaluated the patient. I agree with the PA note as documented above.  Postop day 1 status post right common femoral to PT bypass with PTFE including vein patch  angioplasty of the posterior tibial artery with saphenous vein.  Brisk PT signal this morning.  Incisions are clean dry and intact.  Stable great toe dry gangrene.  Looks good from my standpoint.  Hgb 9.0.  Plavix statin.  Marty Heck, MD [facility] Office: [phone]

## 2021-03-27 NOTE — Progress Notes (Signed)
Nutrition Follow-up  DOCUMENTATION CODES:   Underweight, Severe malnutrition in context of chronic illness  INTERVENTION:   Continue Ensure Enlive po TID, each supplement provides 350 kcal and 20 grams of protein  Continue Multivitamin w/ minerals daily  Recommend liberalizing pt to regular diet due to poor PO intake. Received ok from MD.   NUTRITION DIAGNOSIS:   Severe Malnutrition related to chronic illness as evidenced by severe muscle depletion, severe fat depletion.   GOAL:   Patient will meet greater than or equal to 90% of their needs - Progressing   MONITOR:   Supplement acceptance, PO intake, Skin, Weight trends  REASON FOR ASSESSMENT:   Consult Wound healing  ASSESSMENT:   Pt with PMH significant for SSS s/p pacemaker, CVA w/ residual L hemiplegia, dysphagia and dysarthria, BPH, neurogenic bladder with chronic foley, and recurrent UTIs admitted with R great toe wound infection. 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  10/20: Angioplasty of R Tibia  Pt with dysarthria and difficulty comprehension at baseline.  Unable to obtain nutrition related history and weight history. No family at bedside.  Per EMR, pt intake includes 10% for Dinner on 10/18 and 35% for breakfast on 10/21.  PLDN attempted to reach RN via phone twice, unable to reach RN.  Medications reviewed and include: Colace, Pepcid, MVI, Protonix, IV antibiotics, Senokot Labs reviewed.  NUTRITION - FOCUSED PHYSICAL EXAM:  Flowsheet Row Most Recent Value  Orbital Region Severe depletion  Upper Arm Region Severe depletion  Thoracic and Lumbar Region Severe depletion  Buccal Region Severe depletion  Temple Region Severe depletion  Clavicle Bone Region Severe depletion  Clavicle and Acromion Bone Region Severe depletion  Scapular Bone Region Severe depletion  Dorsal Hand Unable to assess  [Mits]  Patellar Region Severe depletion  Anterior Thigh Region Severe depletion  Posterior Calf  Region Severe depletion  Edema (RD Assessment) None  Hair Reviewed  Eyes Reviewed  Mouth Reviewed  Skin Reviewed  Nails Unable to assess  [Mits]       Diet Order:   Diet Order             Diet regular Room service appropriate? Yes; Fluid consistency: Thin  Diet effective now                   EDUCATION NEEDS:   No education needs have been identified at this time  Skin:  Skin Assessment: Skin Integrity Issues: Skin Integrity Issues:: Incisions Incisions: R Groin, Leg Other: Blister - R Great Toe  Last BM:  03/25/2021  Height:   Ht Readings from Last 1 Encounters:  03/26/21 6\' 1"  (1.854 m)    Weight:   Wt Readings from Last 1 Encounters:  03/26/21 62.2 kg    Ideal Body Weight:  83.6 kg  BMI:  Body mass index is 18.09 kg/m.  Estimated Nutritional Needs:   Kcal:  1800-2000  Protein:  90-100 grams  Fluid:  >1.8L/d    Roxana Hires, PLDN Clinical Dietitian See AMiON for contact information.

## 2021-03-27 NOTE — Progress Notes (Signed)
PROGRESS NOTE  Todd Dunn  DOB: [birth year]  PCP: Fredrich Romans, [place] HKV:[id number]  DOA: 03/20/2021  LOS: 7 days  Hospital Day: 8   Chief Complaint  Patient presents with   Wound Check   Toe Pain   Wound Infection    Brief narrative: Todd Dunn is a 80 y.o. male with PMH significant for history of CAD/CABG, ppm for SSS, ICM, CVA with residual left hemiplegia, dysphagia, and dysarthria, neurogenic bladder with chronic Foley, recurrent UTIs and also with SSS s/p pacemaker who lives at home with his granddaughter. Patient was brought to the ED on 10/14 with 2 weeks history of progressive left great toe infection without any antecedent trauma or ulcer.  X-ray right foot concerning for osteomyelitis of the lateral tuft.   Patient was started on cefepime and linezolid. Wound swab culture was sent.. Admitted to hospitalist service Evaluated by orthopedics and vascular surgery. See below for details  Subjective: Patient was seen and examined this morning. Alert, awake, unclear speech at baseline.  Not restless or agitated.  Family not at bedside.  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 -WBC count normal, lactate level normal.  No fever. -Wound culture grew staph aureus. -Patient was treated with IV antibiotics.  Discussed with Dr. Sharol Given.  No need to continue antibiotics at this time.  Patient to follow-up with in the office. Recent Labs  Lab 03/23/21 0201 03/24/21 0224 03/25/21 0314 03/26/21 0336 03/27/21 0038  WBC 5.6 5.4 5.3 4.5 6.4   Critical limb ischemia of right lower extremity Peripheral artery disease -Because of the right great toe wound, gangrene, peripheral artery disease was suspected, vascular surgery consult was obtained. -10/17, patient underwent aortogram and right lower extremity angiography.  Per report, heavily calcified aorta with  patent renal arteries, severely decreased flow, occluded right SFA.  -On 10/20, patient underwent angioplasty of the right posterior tibial artery and bypass of the right common femoral artery to posterior tibial artery with prosthetic graft. -Continue Plavix, statin after his CVA  Ischemic cardiomyopathy with EF 30 to 35% Essential hypertension -Currently heart failure seems compensated. -Prior to admission patient was on Lasix, Imdur, lisinopril, hydralazine and amlodipine -Currently he is on Imdur, hydralazine and amlodipine. Lasix and lisinopril are on hold.   -Hemodynamically stable  CAD/CABG in 2014 -Currently has no chest pain.  Seen by cardiology.  Continue Plavix, Imdur, statin.  History of SSS s/p pacemaker -currently paced rhythm  History of CVA with residual left hemiplegia, dysarthria, dysphagia -Continue statin and Plavix -Baseline wheelchair-bound, lives at home  UTI related to chronic Foley History of recurrent UTI Neurogenic bladder /chronic indwelling Foley -Urine culture sent on admission grew 70,000 colonies per mL of Proteus mirabilis. -Already on antibiotics.   -Foley changed on 10/14 in the ED. -Continue Proscar and Flomax   Acute metabolic encephalopathy -Intermittent restlessness, agitation.  Currently stable. -Per granddaughter, patient tends to have these episodes when he gets UTI.  She states that his stroke was several years ago and did not leave any residual cognitive deficit or change in mental status. -Patient remains alert, awake this morning although has baseline dysarthria and difficulty comprehension.  Continue to monitor mental status change.  Chronic anemia -Slight drop in hemoglobin this morning probably due to surgery.  Continue to monitor. Recent Labs    01/06/21 1506 01/06/21 2232 03/23/21 0201 03/24/21 0224 03/25/21 0314 03/26/21 0336 03/27/21 0038  HGB  10.1*   < > 9.2* 10.0* 11.0* 10.8* 9.0*  MCV 81.7   < > 80.4 83.9 80.3 80.9  80.4  VITAMINB12 183  --   --   --   --   --   --   FOLATE 13.8  --   --   --   --   --   --   FERRITIN 31  --   --   --   --   --   --   TIBC 301  --   --   --   --   --   --   IRON 72  --   --   --   --   --   --   RETICCTPCT 1.4  --   --   --   --   --   --    < > = values in this interval not displayed.   Depression/anxiety -Celexa 20 mg at bedtime   Mobility: PT eval postprocedure Code Status:   Code Status: Full Code  Nutritional status: Body mass index is 18.09 kg/m. Nutrition Problem: Increased nutrient needs Etiology: wound healing Signs/Symptoms: estimated needs Diet:  Diet Order             Diet Heart Room service appropriate? Yes; Fluid consistency: Thin  Diet effective now                  DVT prophylaxis:  SCD's Start: 03/26/21 1347 enoxaparin (LOVENOX) injection 40 mg Start: 03/20/21 1600   Antimicrobials: Antibiotics stopped today Fluid: None Consultants: Vascular surgery, orthopedics Family Communication: None at bedside  Status is: Inpatient  Remains inpatient appropriate because: POD 1  Dispo: The patient is from: Home              Anticipated d/c is to: Home versus SNF.  Pending PT eval.              Patient currently is not medically stable to d/c.   Difficult to place patient No     Infusions:   sodium chloride 250 mL (03/22/21 3149)   sodium chloride     sodium chloride     sodium chloride     magnesium sulfate bolus IVPB      Scheduled Meds:  amLODipine  10 mg Oral Daily   Chlorhexidine Gluconate Cloth  6 each Topical Daily   clopidogrel  75 mg Oral QHS   docusate sodium  100 mg Oral Daily   enoxaparin (LOVENOX) injection  40 mg Subcutaneous Q24H   famotidine  10 mg Oral Daily   feeding supplement  237 mL Oral TID BM   finasteride  5 mg Oral Daily   hydrALAZINE  50 mg Oral BID   isosorbide mononitrate  60 mg Oral Daily    morphine injection  2 mg Intravenous Once   multivitamin with minerals  1 tablet Oral Daily    pantoprazole  40 mg Oral Daily   rosuvastatin  10 mg Oral Daily   senna-docusate  1 tablet Oral BID   sodium chloride flush  3 mL Intravenous Q12H   sodium chloride flush  3 mL Intravenous Q12H   tamsulosin  0.4 mg Oral Daily    Antimicrobials: Anti-infectives (From admission, onward)    Start     Dose/Rate Route Frequency Ordered Stop   03/26/21 1600  vancomycin (VANCOCIN) IVPB 1000 mg/200 mL premix        1,000 mg 200 mL/hr over 60  Minutes Intravenous Every 12 hours 03/26/21 1346 03/27/21 0558   03/24/21 1415  ceFAZolin (ANCEF) IVPB 2g/100 mL premix  Status:  Discontinued        2 g 200 mL/hr over 30 Minutes Intravenous Every 8 hours 03/24/21 1329 03/27/21 1049   03/20/21 2215  linezolid (ZYVOX) IVPB 600 mg  Status:  Discontinued        600 mg 300 mL/hr over 60 Minutes Intravenous Every 12 hours 03/20/21 2208 03/24/21 1329   03/20/21 2215  ceFEPIme (MAXIPIME) 2 g in sodium chloride 0.9 % 100 mL IVPB  Status:  Discontinued        2 g 200 mL/hr over 30 Minutes Intravenous Every 8 hours 03/20/21 2208 03/24/21 1329   03/20/21 1315  clindamycin (CLEOCIN) capsule 300 mg  Status:  Discontinued        300 mg Oral  Once 03/20/21 1306 03/20/21 1338       PRN meds: sodium chloride, sodium chloride, sodium chloride, acetaminophen **OR** acetaminophen, albuterol, alum & mag hydroxide-simeth, guaiFENesin-dextromethorphan, hydrALAZINE, HYDROcodone-acetaminophen, labetalol, magnesium sulfate bolus IVPB, ondansetron **OR** ondansetron (ZOFRAN) IV, phenol, potassium chloride, sodium chloride flush   Objective: Vitals:   03/27/21 0700 03/27/21 1216  BP: 128/63 (!) 109/54  Pulse: 70 85  Resp: 16 17  Temp: 98.4 F (36.9 C) 98.5 F (36.9 C)  SpO2: 100% 100%    Intake/Output Summary (Last 24 hours) at 03/27/2021 1242 Last data filed at 03/27/2021 1100 Gross per 24 hour  Intake 1730.39 ml  Output 2200 ml  Net -469.61 ml    Filed Weights   03/21/21 0006 03/26/21 0754  Weight: 62.2 kg  62.2 kg   Weight change:  Body mass index is 18.09 kg/m.   Physical Exam: General exam: Ederly African-American male.  Not in pain skin: No rashes, lesions or ulcers. HEENT: Atraumatic, normocephalic, no obvious bleeding Lungs: Clear to auscultation bilaterally CVS: Regular rate and rhythm, no murmur GI/Abd soft, nontender, nondistended, bowel sound present CNS: Alert, awake, unclear voice at baseline. psychiatry: Mood appropriate Extremities: No pedal edema, no calf tenderness, right toenail avulsion and infection  Data Review: I have personally reviewed the laboratory data and studies available.  Recent Labs  Lab 03/23/21 0201 03/24/21 0224 03/25/21 0314 03/26/21 0336 03/27/21 0038  WBC 5.6 5.4 5.3 4.5 6.4  NEUTROABS  --  3.3 3.3 2.5  --   HGB 9.2* 10.0* 11.0* 10.8* 9.0*  HCT 29.5* 33.4* 35.1* 35.2* 29.2*  MCV 80.4 83.9 80.3 80.9 80.4  PLT 166 175 201 210 209    Recent Labs  Lab 03/23/21 0201 03/24/21 0224 03/25/21 0314 03/26/21 0336 03/27/21 0038  NA 135 137 129* 136 136  K 4.1 4.0 3.4* 4.3 4.7  CL 106 107 98 101 104  CO2 25 22 26 28 26   GLUCOSE 110* 117* 123* 113* 97  BUN 13 9 8 11 13   CREATININE 0.78 0.73 0.76 0.76 0.74  CALCIUM 9.4 9.3 9.6 9.9 9.7     F/u labs ordered Unresulted Labs (From admission, onward)    None       Signed, Terrilee Croak, MD [facility] 03/27/2021

## 2021-03-27 NOTE — Care Management Important Message (Signed)
Important Message  Patient Details  Name: Todd Dunn MRN: [id number] Date of Birth: [birth year]   Medicare Important Message Given:  Yes     Shelda Altes 03/27/2021, 10:46 AM

## 2021-03-27 NOTE — NC FL2 (Signed)
[place] LEVEL OF CARE SCREENING TOOL     IDENTIFICATION  Patient Name: Todd Dunn Birthdate: 1941-05-20 Sex: male Admission Date (Current Location): 03/20/2021  [facility] [place] Number:  Herbalist and Address:  [facility], [place] [address], [place], [place] [address]      Provider Number: [id number]  Attending Physician Name and Address:  Terrilee Croak, MD  Relative Name and Phone Number:  [name] [phone]    Current Level of Care: Hospital Recommended Level of Care: [place] Prior Approval Number:    Date Approved/Denied:   PASRR Number: [id number]  Discharge Plan: SNF    Current Diagnoses: Patient Active Problem List   Diagnosis Date Noted   Toe infection    PVD (peripheral vascular disease) ([place])    Osteomyelitis ([place]) 03/20/2021   Wound infection 03/20/2021   Pacemaker 03/20/2021   Elevated alkaline phosphatase level 03/20/2021   Hyperlipidemia 03/20/2021   Hematuria 02/14/2021   Acute cystitis with hematuria    Normocytic anemia 01/06/2021   Catheter-associated urinary tract infection ([place]) 79/39/0300   Acute metabolic encephalopathy 92/33/0076   Chronic indwelling Foley catheter 12/15/2020   HTN (hypertension) 12/15/2020   History of ESBL E. coli infection 12/15/2020    Orientation RESPIRATION BLADDER Height & Weight     Self, Place  Normal Incontinent (Urethrel Catheter Fr. 16) Weight: 137 lb 2 oz (62.2 kg) Height:  6\' 1"  (185.4 cm)  BEHAVIORAL SYMPTOMS/MOOD NEUROLOGICAL BOWEL NUTRITION STATUS      Incontinent Diet (Please see discharge summary)  AMBULATORY STATUS COMMUNICATION OF NEEDS Skin   Limited Assist Verbally Other (Comment) (Incision Closed Groin Right, Incision closed Leg Right,Wound/Incision open or dehiced toe Right posterior)                       Personal Care Assistance Level of Assistance  Bathing, Feeding, Dressing Bathing Assistance: Maximum  assistance Feeding assistance: Maximum assistance (Need set up;Needs assist) Dressing Assistance: Maximum assistance     Functional Limitations Info  Sight, Hearing, Speech     Speech Info: Adequate    SPECIAL CARE FACTORS FREQUENCY  PT (By licensed PT), OT (By licensed OT)     PT Frequency: 5x min weekly OT Frequency: 5x min weekly            Contractures Contractures Info: Not present    Additional Factors Info  Code Status, Allergies Code Status Info: FULL Allergies Info: Aspirin,[place] Clam (m. [place]enaria) Skin Test,Penicillins           Current Medications (03/27/2021):  This is the current hospital active medication list Current Facility-Administered Medications  Medication Dose Route Frequency Provider Last Rate Last Admin   0.9 %  sodium chloride infusion   Intravenous PRN Baglia, Corrina, PA-C 10 mL/hr at 03/22/21 0638 250 mL at 03/22/21 0638   0.9 %  sodium chloride infusion   Intravenous Continuous Baglia, Corrina, PA-C       0.9 %  sodium chloride infusion  250 mL Intravenous PRN Baglia, Corrina, PA-C       0.9 %  sodium chloride infusion  500 mL Intravenous Once PRN Baglia, Corrina, PA-C       acetaminophen (TYLENOL) tablet 650 mg  650 mg Oral Q6H PRN Baglia, Corrina, PA-C   650 mg at 03/22/21 1159   Or   acetaminophen (TYLENOL) suppository 650 mg  650 mg Rectal Q6H PRN Baglia, Corrina, PA-C  albuterol (PROVENTIL) (2.5 MG/3ML) 0.083% nebulizer solution 2.5 mg  2.5 mg Nebulization Q6H PRN Baglia, Corrina, PA-C       alum & mag hydroxide-simeth (MAALOX/MYLANTA) 200-200-20 MG/5ML suspension 15-30 mL  15-30 mL Oral Q2H PRN Baglia, Corrina, PA-C       amLODipine (NORVASC) tablet 10 mg  10 mg Oral Daily Baglia, Corrina, PA-C   10 mg at 03/27/21 1111   Chlorhexidine Gluconate Cloth 2 % PADS 6 each  6 each Topical Daily Baglia, Corrina, PA-C   6 each at 03/26/21 1351   clopidogrel (PLAVIX) tablet 75 mg  75 mg Oral QHS Baglia, Corrina, PA-C   75 mg at  03/26/21 2038   docusate sodium (COLACE) capsule 100 mg  100 mg Oral Daily Baglia, Corrina, PA-C       enoxaparin (LOVENOX) injection 40 mg  40 mg Subcutaneous Q24H Baglia, Corrina, PA-C   40 mg at 03/26/21 1539   famotidine (PEPCID) tablet 10 mg  10 mg Oral Daily Baglia, Corrina, PA-C   10 mg at 03/27/21 1111   feeding supplement (ENSURE ENLIVE / ENSURE PLUS) liquid 237 mL  237 mL Oral TID BM Baglia, Corrina, PA-C   237 mL at 03/25/21 2249   finasteride (PROSCAR) tablet 5 mg  5 mg Oral Daily Baglia, Corrina, PA-C   5 mg at 03/27/21 1111   guaiFENesin-dextromethorphan (ROBITUSSIN DM) 100-10 MG/5ML syrup 15 mL  15 mL Oral Q4H PRN Baglia, Corrina, PA-C       hydrALAZINE (APRESOLINE) injection 5 mg  5 mg Intravenous Q4H PRN Baglia, Corrina, PA-C       hydrALAZINE (APRESOLINE) tablet 50 mg  50 mg Oral BID Baglia, Corrina, PA-C   50 mg at 03/27/21 1118   HYDROcodone-acetaminophen (NORCO/VICODIN) 5-325 MG per tablet 1 tablet  1 tablet Oral Q6H PRN Baglia, Corrina, PA-C   1 tablet at 03/27/21 1205   isosorbide mononitrate (IMDUR) 24 hr tablet 60 mg  60 mg Oral Daily Baglia, Corrina, PA-C   60 mg at 03/27/21 1111   labetalol (NORMODYNE) injection 10 mg  10 mg Intravenous Q10 min PRN Baglia, Corrina, PA-C   10 mg at 03/25/21 9407   magnesium sulfate IVPB 2 g 50 mL  2 g Intravenous Daily PRN Baglia, Corrina, PA-C       morphine 2 MG/ML injection 2 mg  2 mg Intravenous Once Baglia, Corrina, PA-C       multivitamin with minerals tablet 1 tablet  1 tablet Oral Daily Baglia, Corrina, PA-C   1 tablet at 03/27/21 1111   ondansetron (ZOFRAN) tablet 4 mg  4 mg Oral Q6H PRN Baglia, Corrina, PA-C       Or   ondansetron (ZOFRAN) injection 4 mg  4 mg Intravenous Q6H PRN Baglia, Corrina, PA-C       pantoprazole (PROTONIX) EC tablet 40 mg  40 mg Oral Daily Baglia, Corrina, PA-C   40 mg at 03/27/21 1111   phenol (CHLORASEPTIC) mouth spray 1 spray  1 spray Mouth/Throat PRN Baglia, Corrina, PA-C       potassium chloride SA  (KLOR-CON) CR tablet 20-40 mEq  20-40 mEq Oral Daily PRN Baglia, Corrina, PA-C       rosuvastatin (CRESTOR) tablet 10 mg  10 mg Oral Daily Baglia, Corrina, PA-C   10 mg at 03/27/21 1111   senna-docusate (Senokot-S) tablet 1 tablet  1 tablet Oral BID Baglia, Corrina, PA-C   1 tablet at 03/27/21 1111   sodium chloride flush (NS) 0.9 % injection 3  mL  3 mL Intravenous Q12H Baglia, Corrina, PA-C   3 mL at 03/20/21 2224   sodium chloride flush (NS) 0.9 % injection 3 mL  3 mL Intravenous Q12H Baglia, Corrina, PA-C   3 mL at 03/27/21 1118   sodium chloride flush (NS) 0.9 % injection 3 mL  3 mL Intravenous PRN Baglia, Corrina, PA-C       tamsulosin (FLOMAX) capsule 0.4 mg  0.4 mg Oral Daily Baglia, Corrina, PA-C   0.4 mg at 03/27/21 1111     Discharge Medications: Please see discharge summary for a list of discharge medications.  Relevant Imaging Results:  Relevant Lab Results:   Additional Information [id number] Both Covid Vaccines  Milas Gain, LCSWA

## 2021-03-27 NOTE — TOC Initial Note (Signed)
Transition of Care [facility]) - Initial/Assessment Note    Patient Details  Name: Todd Dunn MRN: [id number] Date of Birth: 29-Mar-1941  Transition of Care [facility]) CM/SW Contact:    Milas Gain, [place] Phone Number: 03/27/2021, 3:30 PM  Clinical Narrative:                  CSW received consult for possible SNF placement at time of discharge. Due to patients current orientation CSW spoke with patients Granddaughter [name] regarding PT recommendation of SNF placement at time of discharge. Patients Granddaughter reports patient comes from home with her.Patients Granddaughter expressed understanding of PT recommendation and is agreeable to SNF placement for patient at time of discharge. Patients Granddaughter gave CSW permission to fax out initial referral near the [place] area.CSW discussed insurance authorization process .Patients Granddaughter reports patient  has received the COVID vaccines. No further questions reported at this time. CSW to continue to follow and assist with discharge planning needs.   Expected Discharge Plan: Skilled Nursing Facility Barriers to Discharge: Continued Medical Work up   Patient Goals and CMS Choice   CMS Medicare.gov Compare Post Acute Care list provided to:: Patient Represenative (must comment) (Spoke with patients Grandaughter [name]) Choice offered to / list presented to :  (Spoke with patients Claudette Stapler)  Expected Discharge Plan and Services Expected Discharge Plan: [place] In-house Referral: Clinical Social Work     Living arrangements for the past 2 months: Single Family Home                                      Prior Living Arrangements/Services Living arrangements for the past 2 months: Single Family Home Lives with:: Self, Other (Comment) (Patient lives with his Claudette Stapler) Patient language and need for interpreter reviewed:: Yes Do you feel safe going back to the place where you live?: No    SNF  Need for Family Participation in Patient Care: Yes (Comment) Care giver support system in place?: Yes (comment)   Criminal Activity/Legal Involvement Pertinent to Current Situation/Hospitalization: No - Comment as needed  Activities of Daily Living Home Assistive Devices/Equipment: Wheelchair ADL Screening (condition at time of admission) Patient's cognitive ability adequate to safely complete daily activities?: Yes Is the patient deaf or have difficulty hearing?: No Does the patient have difficulty seeing, even when wearing glasses/contacts?: No Does the patient have difficulty concentrating, remembering, or making decisions?: No Patient able to express need for assistance with ADLs?: Yes Does the patient have difficulty dressing or bathing?: No Independently performs ADLs?: No Communication: Independent Dressing (OT): Needs assistance Is this a change from baseline?: Pre-admission baseline Grooming: Independent Feeding: Independent, Needs assistance Is this a change from baseline?: Pre-admission baseline Bathing: Appropriate for developmental age 45: Appropriate for developmental age In/Out Bed: Needs assistance Is this a change from baseline?: Pre-admission baseline Walks in Home: Dependent Is this a change from baseline?: Pre-admission baseline Does the patient have difficulty walking or climbing stairs?: Yes Weakness of Legs: Both Weakness of Arms/Hands: Left  Permission Sought/Granted Permission sought to share information with : Case Manager, Family Supports, Chartered certified accountant granted to share information with : No  Share Information with NAME: Patient only oriented to self and place spoke with Claudette Stapler  Permission granted to share info w AGENCY: Patient only oriented to self and place spoke with Grandaughter Sandra/SNF  Permission granted to share info w Relationship: Patient  only oriented to self and place spoke with  Claudette Stapler  Permission granted to share info w Contact Information: Patient only oriented to self and place spoke with Claudette Stapler [phone]  Emotional Assessment       Orientation: : Oriented to Self, Oriented to Place Alcohol / Substance Use: Not Applicable Psych Involvement: No (comment)  Admission diagnosis:  Osteomyelitis ([place]) [M86.9] Toe infection [L08.9] Osteomyelitis of great toe of right foot ([place]) [M86.9] Patient Active Problem List   Diagnosis Date Noted   Toe infection    PVD (peripheral vascular disease) ([place])    Osteomyelitis ([place]) 03/20/2021   Wound infection 03/20/2021   Pacemaker 03/20/2021   Elevated alkaline phosphatase level 03/20/2021   Hyperlipidemia 03/20/2021   Hematuria 02/14/2021   Acute cystitis with hematuria    Normocytic anemia 01/06/2021   Catheter-associated urinary tract infection ([place]) 50/38/8828   Acute metabolic encephalopathy 00/34/9179   Chronic indwelling Foley catheter 12/15/2020   HTN (hypertension) 12/15/2020   History of ESBL E. coli infection 12/15/2020   PCP:  Fredrich Romans, PA Pharmacy:   [facility] [place], [place] - [address] [place] [address] Phone: [phone] Fax: [phone]     Social Determinants of Health (SDOH) Interventions    Readmission Risk Interventions Readmission Risk Prevention Plan 02/17/2021  Transportation Screening Complete  Medication Review ([place]) Complete  PCP or Specialist appointment within 3-5 days of discharge Complete  HRI or [place] Complete  SW Recovery Care/Counseling Consult Complete  [place] Not Applicable

## 2021-03-27 NOTE — Progress Notes (Addendum)
Cross-coverage note:   Patient developed hyperactive delirium shortly before shift change, has continued to worsen and not responding to bed alarm and non-pharmacologic interventions. He is high fall risk and posing danger to self. No specific complaint or obvious reversible etiology. QT interval was prolonged on recent EKG. Plan for sitter, delirium precautions, low-dose Ativan if needed (QT is prolonged).

## 2021-03-27 NOTE — Progress Notes (Signed)
Informed by PT that a tooth was found in the bed when Pt stood up.  When questioned "what happened to your tooth?" Pt states, "I pulled it out."  Endorses tooth was loose in mouth.  Does not endorse any pain at site.  MD aware.

## 2021-03-27 NOTE — Evaluation (Signed)
Physical Therapy Evaluation Patient Details Name: Todd Dunn MRN: [id number] DOB: 1941-02-09 Today's Date: 03/27/2021  History of Present Illness  Pt is an 80 y.o. male admitted 03/20/21 with partial avulsion of R great toenail. S/p abdominal aortogram 10/15. S/p RLE angiography, cannulation L femoral artery 10/17. S/p angioplasty, R femoral to posterior tibial artery bypass on 10/20. PMH includes CVA (residual L-side weakness, CAD, pacemaker, HTN, neurogenic bladder (chronic indwelling foley catheter).   Clinical Impression  Pt in bed upon arrival of PT, confused, but agreeable to session. The pt was unable to provide accurate information regarding PLOF, but family arrived at end of session to provide information below. The pt was confused, not following commands, and not redirectable through the session. He was able to attempt standing from EOB, but requires significant physical assist to power up and to steady in standing due to poor balance, awareness, and tendency to lift RLE off floor (presume due to pain but pt unable to state why). Unsafe to attempt ambulation or other mobility at this time, will likely require continued skilled PT as well as placement in SNF for continued rehab when medically stable for d/c.         Recommendations for follow up therapy are one component of a multi-disciplinary discharge planning process, led by the attending physician.  Recommendations may be updated based on patient status, additional functional criteria and insurance authorization.  Follow Up Recommendations SNF    Equipment Recommendations   (defer to post acute)    Recommendations for Other Services       Precautions / Restrictions Precautions Precautions: Fall;Other (comment) Precaution Comments: impulsive, not redirectable Restrictions Weight Bearing Restrictions: No      Mobility  Bed Mobility Overal bed mobility: Needs Assistance Bed Mobility: Supine to Sit;Sit to Supine      Supine to sit: Supervision;HOB elevated Sit to supine: Min assist   General bed mobility comments: Supervision with increased time to sit EOB, frequent cues needed for safety. Min A to get R LE back into bed    Transfers Overall transfer level: Needs assistance Equipment used: Rolling walker (2 wheeled) Transfers: Sit to/from Stand Sit to Stand: Mod assist;+2 physical assistance;+2 safety/equipment;From elevated surface         General transfer comment: With initial sit to stand trial at bedside with RW, pt pushing/pulling on RW and attempted to stand with B LEs crossed. With second attempt, pt able to stand with Mod A x2 for elevated bed - mulitmodal cues for hand placement on RW and then pt suddenly lifting R LE up off of ground and attempting to prop on bed. Unable to safely attempt mobility away from bed this AM  Ambulation/Gait             General Gait Details: unsafe to attempt ambulation as pt not following commands or able to maintain stabilty despite modA of 2     Balance Overall balance assessment: Needs assistance Sitting-balance support: Feet supported;No upper extremity supported Sitting balance-Leahy Scale: Fair Sitting balance - Comments: leaning/falling to R with no attempt to correct, also not following commands/cues to correct Postural control: Right lateral lean Standing balance support: Bilateral upper extremity supported;During functional activity Standing balance-Leahy Scale: Zero Standing balance comment: reliant on UE support + external support                             Pertinent Vitals/Pain Pain Assessment: No/denies pain Faces Pain Scale:  Hurts little more Pain Location: R LE Pain Descriptors / Indicators: Grimacing;Guarding;Other (Comment) (lifting up off bed, also not placing on floor for standing) Pain Intervention(s): Limited activity within patient's tolerance;Monitored during session;Repositioned    Home Living  Family/patient expects to be discharged to:: Private residence Living Arrangements: Other relatives (granddaughter) Available Help at Discharge: Family Type of Home: House Home Access: Stairs to enter   Technical brewer of Steps: 2 Home Layout: One level Home Equipment: Environmental consultant - 2 wheels;Wheelchair - manual;Grab bars - tub/shower Additional Comments: info obtained from previous admission. Grandson entering during session and reports pt lives with a granddaughter    Prior Function Level of Independence: Needs assistance   Gait / Transfers Assistance Needed: Pt has a w/c but has been working with HHPT and progressed to walking with RW and min guard. Reports even able to get his L foot out front (used to drag).  Granddaughter states excellent improvement with HHPT  ADL's / Homemaking Assistance Needed: family assists with bathing and dressing  Comments: PLOF obtained from previous admission 9/22. Pt unable to provide any reliable hx today     Hand Dominance   Dominant Hand: Right    Extremity/Trunk Assessment   Upper Extremity Assessment Upper Extremity Assessment: Defer to OT evaluation    Lower Extremity Assessment Lower Extremity Assessment: Generalized weakness;Difficult to assess due to impaired cognition (pt unable to follow any commands consistently for functional strength assessment or MMT)    Cervical / Trunk Assessment Cervical / Trunk Assessment: Kyphotic  Communication   Communication: Expressive difficulties  Cognition Arousal/Alertness: Awake/alert Behavior During Therapy: Restless;Impulsive Overall Cognitive Status: No family/caregiver present to determine baseline cognitive functioning Area of Impairment: Orientation;Attention;Memory;Following commands;Safety/judgement;Problem solving;Awareness                 Orientation Level: Disoriented to;Time;Place Current Attention Level: Focused Memory: Decreased short-term memory Following Commands:  Follows one step commands inconsistently;Follows one step commands with increased time Safety/Judgement: Decreased awareness of safety;Decreased awareness of deficits Awareness: Intellectual Problem Solving: Slow processing;Requires verbal cues;Requires tactile cues General Comments: Pt very impulsive and restless, often quickly lifting R LE up in air (whether in bed or in standing to relieve pressure). Pt pulling at lines, unable to safely be directed in OOB activities. tangential conversation, does report he is at hospital (initially reports psych) and did say "yes" when asked if he had surgery and able to state surgery on R foot      General Comments General comments (skin integrity, edema, etc.): Grandson entering at end of session, attempted to gain some insight into PLOF/home setup. Educated that pt needs assist with self feeding, supervision if mitts off to ensure safety as grandson noted to remove mitts. During session, a tooth also was found in bed under pt - RN aware        Assessment/Plan    PT Assessment Patient needs continued PT services  PT Problem List Decreased strength;Decreased range of motion;Decreased activity tolerance;Decreased coordination;Decreased mobility;Decreased balance;Decreased cognition;Decreased safety awareness;Pain       PT Treatment Interventions DME instruction;Gait training;Stair training;Functional mobility training;Therapeutic activities;Therapeutic exercise;Balance training;Patient/family education    PT Goals (Current goals can be found in the Care Plan section)  Acute Rehab PT Goals Patient Stated Goal: eat some breakfast PT Goal Formulation: With patient Time For Goal Achievement: 04/10/21 Potential to Achieve Goals: Good    Frequency Min 2X/week   Barriers to discharge Decreased caregiver support      Co-evaluation PT/OT/SLP Co-Evaluation/Treatment: Yes Reason for Co-Treatment:  Complexity of the patient's impairments (multi-system  involvement);Necessary to address cognition/behavior during functional activity;For patient/therapist safety;To address functional/ADL transfers PT goals addressed during session: Proper use of DME;Balance;Mobility/safety with mobility;Strengthening/ROM         AM-PAC PT "6 Clicks" Mobility  Outcome Measure Help needed turning from your back to your side while in a flat bed without using bedrails?: Total Help needed moving from lying on your back to sitting on the side of a flat bed without using bedrails?: Total Help needed moving to and from a bed to a chair (including a wheelchair)?: Total Help needed standing up from a chair using your arms (e.g., wheelchair or bedside chair)?: Total Help needed to walk in hospital room?: Total Help needed climbing 3-5 steps with a railing? : Total 6 Click Score: 6    End of Session Equipment Utilized During Treatment: Gait belt Activity Tolerance: Patient tolerated treatment well;Treatment limited secondary to agitation Patient left: in bed;with call bell/phone within reach;with bed alarm set;with family/visitor present;with restraints reapplied (bilateral mits) Nurse Communication: Mobility status (tooth found) PT Visit Diagnosis: Other abnormalities of gait and mobility (R26.89);Muscle weakness (generalized) (M62.81)    Time: 8101-7510 PT Time Calculation (min) (ACUTE ONLY): 27 min   Charges:   PT Evaluation $PT Eval Moderate Complexity: 1 Mod          [place], PT, DPT   Acute Rehabilitation Department Pager #: [phone]  Sandra Cockayne 03/27/2021, 1:22 PM

## 2021-03-28 DIAGNOSIS — L089 Local infection of the skin and subcutaneous tissue, unspecified: Secondary | ICD-10-CM | POA: Diagnosis not present

## 2021-03-28 DIAGNOSIS — T148XXA Other injury of unspecified body region, initial encounter: Secondary | ICD-10-CM | POA: Diagnosis not present

## 2021-03-28 MED ORDER — LORAZEPAM 2 MG/ML IJ SOLN
0.5000 mg | Freq: Four times a day (QID) | INTRAMUSCULAR | Status: DC | PRN
  Administered 2021-03-28 – 2021-04-05 (×15): 0.5 mg via INTRAVENOUS
  Filled 2021-03-28 (×17): qty 1

## 2021-03-28 NOTE — Progress Notes (Signed)
PROGRESS NOTE  Todd Dunn  DOB: Apr 09, 1941  PCP: Fredrich Romans, [place] QQI:[id number]  DOA: 03/20/2021  LOS: 8 days  Hospital Day: 9   Chief Complaint  Patient presents with   Wound Check   Toe Pain   Wound Infection    Brief narrative: Todd Dunn is a 80 y.o. male with PMH significant for history of CAD/CABG, ppm for SSS, ICM, CVA with residual left hemiplegia, dysphagia, and dysarthria, neurogenic bladder with chronic Foley, recurrent UTIs and also with SSS s/p pacemaker who lives at home with his granddaughter. Patient was brought to the ED on 10/14 with 2 weeks history of progressive left great toe infection without any antecedent trauma or ulcer.  X-ray right foot concerning for osteomyelitis of the lateral tuft.   Patient was started on cefepime and linezolid. Wound swab culture was sent.. Admitted to hospitalist service Evaluated by orthopedics and vascular surgery. See below for details  Subjective: Patient was seen and examined this morning. Lying on bed.  Not in distress.  Unclear speech.  Remains at baseline. The nail of his right great toe was barely hanging and easily fell off when I gently pulled without hurting him.    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 -WBC count normal, lactate level normal.  No fever. -Wound culture grew staph aureus. -Patient was initially started on broad-spectrum antibiotics.  Discussed with Dr. Sharol Given on 10/21.  No need to continue antibiotics at this time.  Patient to follow-up with in the office. -Today, right toenail fell off and gentle pull.  No bleeding from the site.  Continue local wound care. Recent Labs  Lab 03/23/21 0201 03/24/21 0224 03/25/21 0314 03/26/21 0336 03/27/21 0038  WBC 5.6 5.4 5.3 4.5 6.4  Critical limb ischemia of right lower extremity Peripheral artery disease -Because of the right  great toe wound, gangrene, peripheral artery disease was suspected, vascular surgery consult was obtained. -10/17, patient underwent aortogram and right lower extremity angiography.  Per report, heavily calcified aorta with patent renal arteries, severely decreased flow, occluded right SFA.  -On 10/20, patient underwent angioplasty of the right posterior tibial artery and bypass of the right common femoral artery to posterior tibial artery with prosthetic graft. -Continue Plavix, statin after his CVA  Ischemic cardiomyopathy with EF 30 to 35% Essential hypertension -Currently heart failure is compensated. -Prior to admission patient was on Lasix, Imdur, lisinopril, hydralazine and amlodipine -Currently he is on Imdur, hydralazine and amlodipine. Lasix and lisinopril are on hold.   -Hemodynamically stable.  CAD/CABG in 2014 -Currently has no chest pain.  Seen by cardiology.  Continue Plavix, Imdur, statin.  History of SSS s/p pacemaker -currently paced rhythm  History of CVA with residual left hemiplegia, dysarthria, dysphagia -Continue statin and Plavix -Baseline wheelchair-bound, lives at home  UTI related to chronic Foley History of recurrent UTI Neurogenic bladder /chronic indwelling Foley -Urine culture sent on admission grew 70,000 colonies per mL of Proteus mirabilis. -Already on antibiotics.   -Foley changed on 10/14 in the ED. -Continue Proscar and Flomax   Acute metabolic encephalopathy -Intermittent restlessness, agitation.  Currently stable. -Per granddaughter, patient tends to have these episodes when he gets UTI.  She states that his stroke was several years ago and did not leave any residual cognitive deficit or change in mental status. -Patient remains alert, awake this morning although has baseline dysarthria and difficulty comprehension.  Continue to  monitor mental status change.  Chronic anemia -Slight drop in hemoglobin this morning probably due to surgery.   Continue to monitor. -Repeat hemoglobin tomorrow Recent Labs    01/06/21 1506 01/06/21 2232 03/23/21 0201 03/24/21 0224 03/25/21 0314 03/26/21 0336 03/27/21 0038  HGB 10.1*   < > 9.2* 10.0* 11.0* 10.8* 9.0*  MCV 81.7   < > 80.4 83.9 80.3 80.9 80.4  VITAMINB12 183  --   --   --   --   --   --   FOLATE 13.8  --   --   --   --   --   --   FERRITIN 31  --   --   --   --   --   --   TIBC 301  --   --   --   --   --   --   IRON 72  --   --   --   --   --   --   RETICCTPCT 1.4  --   --   --   --   --   --    < > = values in this interval not displayed.   Depression/anxiety -Celexa 20 mg at bedtime   Mobility: PT eval postprocedure Code Status:   Code Status: Full Code  Nutritional status: Body mass index is 18.09 kg/m. Nutrition Problem: Severe Malnutrition Etiology: chronic illness Signs/Symptoms: severe muscle depletion, severe fat depletion Diet:  Diet Order             Diet regular Room service appropriate? No; Fluid consistency: Thin  Diet effective now                  DVT prophylaxis:  SCD's Start: 03/26/21 1347 enoxaparin (LOVENOX) injection 40 mg Start: 03/20/21 1600   Antimicrobials: Antibiotics stopped yesterday 10/21 Fluid: None Consultants: Vascular surgery, orthopedics Family Communication: None at bedside  Status is: Inpatient  Remains inpatient appropriate because: POD 1  Dispo: The patient is from: Home              Anticipated d/c is to: SNF recommended by PT              Patient currently is not medically stable to d/c.   Difficult to place patient No     Infusions:   sodium chloride 250 mL (03/22/21 8469)   sodium chloride     sodium chloride     sodium chloride     magnesium sulfate bolus IVPB      Scheduled Meds:  amLODipine  10 mg Oral Daily   Chlorhexidine Gluconate Cloth  6 each Topical Daily   clopidogrel  75 mg Oral QHS   docusate sodium  100 mg Oral Daily   enoxaparin (LOVENOX) injection  40 mg Subcutaneous Q24H    famotidine  10 mg Oral Daily   feeding supplement  237 mL Oral TID BM   finasteride  5 mg Oral Daily   hydrALAZINE  50 mg Oral BID   isosorbide mononitrate  60 mg Oral Daily    morphine injection  2 mg Intravenous Once   multivitamin with minerals  1 tablet Oral Daily   pantoprazole  40 mg Oral Daily   rosuvastatin  10 mg Oral Daily   senna-docusate  1 tablet Oral BID   sodium chloride flush  3 mL Intravenous Q12H   sodium chloride flush  3 mL Intravenous Q12H   tamsulosin  0.4 mg Oral Daily  Antimicrobials: Anti-infectives (From admission, onward)    Start     Dose/Rate Route Frequency Ordered Stop   03/26/21 1600  vancomycin (VANCOCIN) IVPB 1000 mg/200 mL premix        1,000 mg 200 mL/hr over 60 Minutes Intravenous Every 12 hours 03/26/21 1346 03/28/21 0958   03/24/21 1415  ceFAZolin (ANCEF) IVPB 2g/100 mL premix  Status:  Discontinued        2 g 200 mL/hr over 30 Minutes Intravenous Every 8 hours 03/24/21 1329 03/27/21 1049   03/20/21 2215  linezolid (ZYVOX) IVPB 600 mg  Status:  Discontinued        600 mg 300 mL/hr over 60 Minutes Intravenous Every 12 hours 03/20/21 2208 03/24/21 1329   03/20/21 2215  ceFEPIme (MAXIPIME) 2 g in sodium chloride 0.9 % 100 mL IVPB  Status:  Discontinued        2 g 200 mL/hr over 30 Minutes Intravenous Every 8 hours 03/20/21 2208 03/24/21 1329   03/20/21 1315  clindamycin (CLEOCIN) capsule 300 mg  Status:  Discontinued        300 mg Oral  Once 03/20/21 1306 03/20/21 1338       PRN meds: sodium chloride, sodium chloride, sodium chloride, acetaminophen **OR** acetaminophen, albuterol, alum & mag hydroxide-simeth, guaiFENesin-dextromethorphan, hydrALAZINE, HYDROcodone-acetaminophen, labetalol, LORazepam, magnesium sulfate bolus IVPB, ondansetron **OR** ondansetron (ZOFRAN) IV, phenol, potassium chloride, sodium chloride flush   Objective: Vitals:   03/28/21 0755 03/28/21 0800  BP: (!) 90/43 (!) 94/40  Pulse: 89   Resp: 17 (!) 31  Temp:  98.3 F (36.8 C)   SpO2: 100%     Intake/Output Summary (Last 24 hours) at 03/28/2021 1036 Last data filed at 03/28/2021 0600 Gross per 24 hour  Intake 840 ml  Output 550 ml  Net 290 ml   Filed Weights   03/21/21 0006 03/26/21 0754  Weight: 62.2 kg 62.2 kg   Weight change:  Body mass index is 18.09 kg/m.   Physical Exam: General exam: Ederly African-American male.  Not in pain or distress. skin: No rashes, lesions or ulcers. HEENT: Atraumatic, normocephalic, no obvious bleeding Lungs: Clear to auscultation bilaterally CVS: Regular rate and rhythm, no murmur GI/Abd soft, nontender, nondistended, bowel sound present CNS: Alert, awake, unclear voice at baseline. psychiatry: Mood appropriate Extremities: No pedal edema, no calf tenderness, right toenail fell off.  Data Review: I have personally reviewed the laboratory data and studies available.  Recent Labs  Lab 03/23/21 0201 03/24/21 0224 03/25/21 0314 03/26/21 0336 03/27/21 0038  WBC 5.6 5.4 5.3 4.5 6.4  NEUTROABS  --  3.3 3.3 2.5  --   HGB 9.2* 10.0* 11.0* 10.8* 9.0*  HCT 29.5* 33.4* 35.1* 35.2* 29.2*  MCV 80.4 83.9 80.3 80.9 80.4  PLT 166 175 201 210 209   Recent Labs  Lab 03/23/21 0201 03/24/21 0224 03/25/21 0314 03/26/21 0336 03/27/21 0038  NA 135 137 129* 136 136  K 4.1 4.0 3.4* 4.3 4.7  CL 106 107 98 101 104  CO2 25 22 26 28 26   GLUCOSE 110* 117* 123* 113* 97  BUN 13 9 8 11 13   CREATININE 0.78 0.73 0.76 0.76 0.74  CALCIUM 9.4 9.3 9.6 9.9 9.7    F/u labs ordered Unresulted Labs (From admission, onward)     Start     Ordered   03/29/21 0500  CBC with Differential/Platelet  Tomorrow morning,   R       Question:  Specimen collection method  Answer:  Lab=Lab  collect   03/28/21 0809   03/29/21 7322  Basic metabolic panel  Tomorrow morning,   R       Question:  Specimen collection method  Answer:  Lab=Lab collect   03/28/21 0809            Signed, Terrilee Croak, MD [facility] 03/28/2021

## 2021-03-28 NOTE — Progress Notes (Addendum)
   VASCULAR SURGERY ASSESSMENT & PLAN:   PAD/Critical limb ischemia of the right lower extremity with dry gangrene of the right great toe POD 2 Right common femoral artery to posterior tibial artery bypass using 6 mm ringed PTFE, vein patch angioplasty of right posterior tibial artery with saphenous vein.  Brisk right pedal Doppler signals this morning.  VSS. Temp 100.1 at 1947.  Right great toe with purulent drainage, avulsion of toenail. Plain films suggestive of right GT osteomyelitis of tuft. Antibiotics stopped yesterday.  On Plavix and statin.   Chronic anemia atop acute blood loss: No new labs, Hgb 9 yesterday.  No active bleeding.   Metabolic encephalopathy   DVT prophy: Lovenox   SUBJECTIVE:   Awake, follows commands slowly  PHYSICAL EXAM:   Vitals:   03/27/21 2002 03/27/21 2253 03/28/21 0425 03/28/21 0755  BP:  110/66 (!) 103/46 (!) 90/43  Pulse:  80 65 89  Resp:  16 17 17   Temp:  99.6 F (37.6 C) 97.6 F (36.4 C) 98.3 F (36.8 C)  TempSrc:  Axillary Oral Oral  SpO2: 96% 100% 100% 100%  Weight:      Height:       General appearance: Awake, alert.  Not oriented Cardiac: Heart rate regular, rhythm irregular Respirations: Nonlabored Incisions: Right groin, thigh and lower leg incisions are all well approximated without bleeding or hematoma Extremities: Both feet are warm.  Now with avulsion of right great toenail with ability to express purulence at nail margin. Painful on exam. Pulse/Doppler exam: Brisk right dorsalis pedis, posterior tibial and peroneal artery Doppler signals. + Left dorsalis pedis Doppler signal    LABS:   No new labs PROBLEM LIST:    Principal Problem:   Wound infection Active Problems:   Catheter-associated urinary tract infection (HCC)   Chronic indwelling Foley catheter   HTN (hypertension)   Osteomyelitis (HCC)   Pacemaker   Elevated alkaline phosphatase level   Hyperlipidemia   Toe infection   PVD (peripheral vascular  disease) (HCC)   Protein-calorie malnutrition, severe   CURRENT MEDS:    amLODipine  10 mg Oral Daily   Chlorhexidine Gluconate Cloth  6 each Topical Daily   clopidogrel  75 mg Oral QHS   docusate sodium  100 mg Oral Daily   enoxaparin (LOVENOX) injection  40 mg Subcutaneous Q24H   famotidine  10 mg Oral Daily   feeding supplement  237 mL Oral TID BM   finasteride  5 mg Oral Daily   hydrALAZINE  50 mg Oral BID   isosorbide mononitrate  60 mg Oral Daily    morphine injection  2 mg Intravenous Once   multivitamin with minerals  1 tablet Oral Daily   pantoprazole  40 mg Oral Daily   rosuvastatin  10 mg Oral Daily   senna-docusate  1 tablet Oral BID   sodium chloride flush  3 mL Intravenous Q12H   sodium chloride flush  3 mL Intravenous Q12H   tamsulosin  0.4 mg Oral Daily    Barbie Banner, PA-C  Office: [phone] 03/28/2021   I have interviewed the patient and examined the patient. I agree with the findings by the PA.  His incisions look fine.  He has a palpable right posterior tibial pulse.  Gae Gallop, MD

## 2021-03-29 DIAGNOSIS — L089 Local infection of the skin and subcutaneous tissue, unspecified: Secondary | ICD-10-CM | POA: Diagnosis not present

## 2021-03-29 DIAGNOSIS — T148XXA Other injury of unspecified body region, initial encounter: Secondary | ICD-10-CM | POA: Diagnosis not present

## 2021-03-29 LAB — BASIC METABOLIC PANEL
Anion gap: 5 (ref 5–15)
BUN: 19 mg/dL (ref 8–23)
CO2: 27 mmol/L (ref 22–32)
Calcium: 9.4 mg/dL (ref 8.9–10.3)
Chloride: 104 mmol/L (ref 98–111)
Creatinine, Ser: 0.73 mg/dL (ref 0.61–1.24)
GFR, Estimated: >60 mL/min (ref >60)
Glucose, Bld: 162 mg/dL — ABNORMAL HIGH (ref 70–99)
Potassium: 3.8 mmol/L (ref 3.5–5.1)
Sodium: 136 mmol/L (ref 135–145)

## 2021-03-29 LAB — CBC WITH DIFFERENTIAL/PLATELET
Abs Immature Granulocytes: 0.02 10*3/uL (ref 0.00–0.07)
Basophils Absolute: 0 10*3/uL (ref 0.0–0.1)
Basophils Relative: 1 %
Eosinophils Absolute: 0 10*3/uL (ref 0.0–0.5)
Eosinophils Relative: 0 %
HCT: 23.9 % — ABNORMAL LOW (ref 39.0–52.0)
Hemoglobin: 7.5 g/dL — ABNORMAL LOW (ref 13.0–17.0)
Immature Granulocytes: 0 %
Lymphocytes Relative: 21 %
Lymphs Abs: 1.2 10*3/uL (ref 0.7–4.0)
MCH: 25.1 pg — ABNORMAL LOW (ref 26.0–34.0)
MCHC: 31.4 g/dL (ref 30.0–36.0)
MCV: 79.9 fL — ABNORMAL LOW (ref 80.0–100.0)
Monocytes Absolute: 0.7 10*3/uL (ref 0.1–1.0)
Monocytes Relative: 11 %
Neutro Abs: 4 10*3/uL (ref 1.7–7.7)
Neutrophils Relative %: 67 %
Platelets: 150 10*3/uL (ref 150–400)
RBC: 2.99 MIL/uL — ABNORMAL LOW (ref 4.22–5.81)
RDW: 17 % — ABNORMAL HIGH (ref 11.5–15.5)
WBC: 6 10*3/uL (ref 4.0–10.5)
nRBC: 0 % (ref 0.0–0.2)

## 2021-03-29 NOTE — Progress Notes (Addendum)
VASCULAR SURGERY ASSESSMENT & PLAN:   PAD/Critical limb ischemia of the right lower extremity with gangrene of the right great toe POD 3 Right common femoral artery to posterior tibial artery bypass using 6 mm ringed PTFE, vein patch angioplasty of right posterior tibial artery with saphenous vein.  2+ right PT pulse.  On Plavix and statin. Uusal follow-up is in 2 weeks in the office for incision check.  Metabolic encephalopathy   DVT prophy: Lovenox   SUBJECTIVE:   Non-verbal in NAD  PHYSICAL EXAM:   Vitals:   03/28/21 1118 03/28/21 2026 03/28/21 2344 03/29/21 0402  BP: (!) 96/54 (!) 102/46 (!) 106/49 (!) 126/45  Pulse: 82 100 96 98  Resp: 19 18 20 16   Temp: 98.2 F (36.8 C) 97.9 F (36.6 C) 97.6 F (36.4 C) 97.7 F (36.5 C)  TempSrc: Oral Oral Oral Axillary  SpO2: 99% 99% 100% 99%  Weight:      Height:       General appearance: Awake, alert.  Not oriented Cardiac: Heart rate regular, rhythm irregular Respirations: Nonlabored Incisions: Right groin, thigh and lower leg incisions are all well approximated without bleeding or hematoma Extremities: Both feet are warm.   Pulse/Doppler exam: 2+ left DP pulse  LABS:   Lab Results  Component Value Date   WBC 6.0 03/29/2021   HGB 7.5 (L) 03/29/2021   HCT 23.9 (L) 03/29/2021   MCV 79.9 (L) 03/29/2021   PLT 150 03/29/2021   Lab Results  Component Value Date   CREATININE 0.73 03/29/2021   Lab Results  Component Value Date   INR 1.1 02/14/2021   CBG (last 3)  Recent Labs    03/26/21 2121 03/27/21 0619  GLUCAP 98 127*    PROBLEM LIST:    Principal Problem:   Wound infection Active Problems:   Catheter-associated urinary tract infection (HCC)   Chronic indwelling Foley catheter   HTN (hypertension)   Osteomyelitis (HCC)   Pacemaker   Elevated alkaline phosphatase level   Hyperlipidemia   Toe infection   PVD (peripheral vascular disease) (HCC)   Protein-calorie malnutrition, severe   CURRENT  MEDS:    amLODipine  10 mg Oral Daily   Chlorhexidine Gluconate Cloth  6 each Topical Daily   clopidogrel  75 mg Oral QHS   docusate sodium  100 mg Oral Daily   enoxaparin (LOVENOX) injection  40 mg Subcutaneous Q24H   famotidine  10 mg Oral Daily   feeding supplement  237 mL Oral TID BM   finasteride  5 mg Oral Daily   hydrALAZINE  50 mg Oral BID   isosorbide mononitrate  60 mg Oral Daily    morphine injection  2 mg Intravenous Once   multivitamin with minerals  1 tablet Oral Daily   pantoprazole  40 mg Oral Daily   rosuvastatin  10 mg Oral Daily   senna-docusate  1 tablet Oral BID   sodium chloride flush  3 mL Intravenous Q12H   sodium chloride flush  3 mL Intravenous Q12H   tamsulosin  0.4 mg Oral Daily    Barbie Banner, PA-C  Office: [phone] 03/29/2021   I have interviewed the patient and examined the patient. I agree with the findings by the PA.  He has a palpable right posterior tibial pulse.  Both of his incisions look fine.  Physical therapy has recommended a skilled nursing facility.  He is ready for discharge from our standpoint when a bed is available.  Gae Gallop,  MD

## 2021-03-29 NOTE — TOC Progression Note (Signed)
Transition of Care [facility]) - Progression Note    Patient Details  Name: TORRENCE HAMMACK MRN: [id number] Date of Birth: [birth year]  [facility] CM/SW [place], [place] Phone Number: [phone] 03/29/2021, 9:54 AM  Clinical Narrative:     Spoke with granddaughter [name] and it was agreed that she would call CSW when she gets to the hospital and she will be provided with the medicare.gov rating list and the bed offers for choice to be given.  TOC team will continue to assist with discharge planning needs.   Expected Discharge Plan: [place] Barriers to Discharge: Continued Medical Work up  Expected Discharge Plan and Services Expected Discharge Plan: [place] In-house Referral: Clinical Social Work     Living arrangements for the past 2 months: Single Family Home                                       Social Determinants of Health (SDOH) Interventions    Readmission Risk Interventions Readmission Risk Prevention Plan 02/17/2021  Transportation Screening Complete  Medication Review [employer]) Complete  PCP or Specialist appointment within 3-5 days of discharge Complete  HRI or [place] Complete  SW Recovery Care/Counseling Consult Complete  [place] Not Applicable

## 2021-03-29 NOTE — Progress Notes (Signed)
PROGRESS NOTE  Todd Dunn  DOB: 11/11/1940  PCP: Fredrich Romans, [place] VCB:[id number]  DOA: 03/20/2021  LOS: 9 days  Hospital Day: 10   Chief Complaint  Patient presents with   Wound Check   Toe Pain   Wound Infection    Brief narrative: Todd Dunn is a 80 y.o. male with PMH significant for history of CAD/CABG, ppm for SSS, ICM, CVA with residual left hemiplegia, dysphagia, and dysarthria, neurogenic bladder with chronic Foley, recurrent UTIs and also with SSS s/p pacemaker who lives at home with his granddaughter. Patient was brought to the ED on 10/14 with 2 weeks history of progressive left great toe infection without any antecedent trauma or ulcer.  X-ray right foot concerning for osteomyelitis of the lateral tuft.   Patient was started on cefepime and linezolid. Wound swab culture was sent.. Admitted to hospitalist service Evaluated by orthopedics and vascular surgery. See below for details  Subjective: Patient was seen and examined this morning. Lying down in bed.  Not in distress.  Somewhat restless but not agitated or violent or trying to pull lines out.  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 -WBC count normal, lactate level normal.  No fever. -Wound culture grew staph aureus. -Patient was initially started on broad-spectrum antibiotics.  Discussed with Dr. Sharol Given on 10/21.  No need to continue antibiotics at this time.  Patient to follow-up with in the office. -Today, right toenail fell off and gentle pull.  No bleeding from the site.  Continue local wound care. Recent Labs  Lab 03/24/21 0224 03/25/21 0314 03/26/21 0336 03/27/21 0038 03/29/21 0132  WBC 5.4 5.3 4.5 6.4 6.0  Critical limb ischemia of right lower extremity Peripheral artery disease -Because of the right great toe wound, gangrene, peripheral artery disease was suspected,  vascular surgery consult was obtained. -10/17, patient underwent aortogram and right lower extremity angiography.  Per report, heavily calcified aorta with patent renal arteries, severely decreased flow, occluded right SFA.  -On 10/20, patient underwent angioplasty of the right posterior tibial artery and bypass of the right common femoral artery to posterior tibial artery with prosthetic graft. -Continue Plavix, statin after his CVA  Ischemic cardiomyopathy with EF 30 to 35% Essential hypertension -Currently heart failure is compensated. -Prior to admission patient was on Lasix, Imdur, lisinopril, hydralazine and amlodipine -Currently he is on Imdur, hydralazine and amlodipine. Lasix and lisinopril are on hold.   -Hemodynamically stable.  CAD/CABG in 2014 -Currently has no chest pain.  Seen by cardiology.  Continue Plavix, Imdur, statin.  History of SSS s/p pacemaker -currently paced rhythm  History of CVA with residual left hemiplegia, dysarthria, dysphagia -Continue statin and Plavix -Baseline wheelchair-bound, lives at home  UTI related to chronic Foley History of recurrent UTI Neurogenic bladder /chronic indwelling Foley -Urine culture sent on admission grew 70,000 colonies per mL of Proteus mirabilis. -Already on antibiotics.   -Foley changed on 10/14 in the ED. -Continue Proscar and Flomax   Acute metabolic encephalopathy -Intermittent restlessness, agitation.  Currently stable. -Per granddaughter, patient tends to have these episodes when he gets UTI.  She states that his stroke was several years ago and did not leave any residual cognitive deficit or change in mental status. -Patient remains alert, awake this morning although has baseline dysarthria and difficulty comprehension.  Continue to monitor mental status change.  Chronic anemia -Patient seems to be persistently dropping hemoglobin postsurgically.  No surgical site bleeding noted.  No other evidence of bleeding at  this time.  Hemoglobin down to 7.5 this morning.  Continue to monitor.  Transfuse for less than 7. Recent Labs    01/06/21 1506 01/06/21 2232 03/24/21 0224 03/25/21 0314 03/26/21 0336 03/27/21 0038 03/29/21 0132  HGB 10.1*   < > 10.0* 11.0* 10.8* 9.0* 7.5*  MCV 81.7   < > 83.9 80.3 80.9 80.4 79.9*  VITAMINB12 183  --   --   --   --   --   --   FOLATE 13.8  --   --   --   --   --   --   FERRITIN 31  --   --   --   --   --   --   TIBC 301  --   --   --   --   --   --   IRON 72  --   --   --   --   --   --   RETICCTPCT 1.4  --   --   --   --   --   --    < > = values in this interval not displayed.   Depression/anxiety -Celexa 20 mg at bedtime   Mobility: PT eval postprocedure Code Status:   Code Status: Full Code  Nutritional status: Body mass index is 18.09 kg/m. Nutrition Problem: Severe Malnutrition Etiology: chronic illness Signs/Symptoms: severe muscle depletion, severe fat depletion Diet:  Diet Order             Diet regular Room service appropriate? No; Fluid consistency: Thin  Diet effective now                  DVT prophylaxis:  SCD's Start: 03/26/21 1347 enoxaparin (LOVENOX) injection 40 mg Start: 03/20/21 1600   Antimicrobials: Antibiotics stopped yesterday 10/21 Fluid: None Consultants: Vascular surgery, orthopedics Family Communication: None at bedside  Status is: Inpatient  Remains inpatient appropriate because: Hemoglobin trending down, pending SNF placement  Dispo: The patient is from: Home              Anticipated d/c is to: SNF recommended by PT              Patient currently is not medically stable to d/c.   Difficult to place patient No     Infusions:   sodium chloride 250 mL (03/22/21 6761)   sodium chloride     sodium chloride     sodium chloride     magnesium sulfate bolus IVPB      Scheduled Meds:  amLODipine  10 mg Oral Daily   Chlorhexidine Gluconate Cloth  6 each Topical Daily   clopidogrel  75 mg Oral QHS   docusate  sodium  100 mg Oral Daily   enoxaparin (LOVENOX) injection  40 mg Subcutaneous Q24H   famotidine  10 mg Oral Daily   feeding supplement  237 mL Oral TID BM   finasteride  5 mg Oral Daily   hydrALAZINE  50 mg Oral BID   isosorbide mononitrate  60 mg Oral Daily    morphine injection  2 mg Intravenous Once   multivitamin with minerals  1 tablet Oral Daily   pantoprazole  40 mg Oral Daily   rosuvastatin  10 mg Oral Daily   senna-docusate  1 tablet Oral BID   sodium chloride flush  3 mL Intravenous Q12H   sodium chloride flush  3  mL Intravenous Q12H   tamsulosin  0.4 mg Oral Daily    Antimicrobials: Anti-infectives (From admission, onward)    Start     Dose/Rate Route Frequency Ordered Stop   03/26/21 1600  vancomycin (VANCOCIN) IVPB 1000 mg/200 mL premix        1,000 mg 200 mL/hr over 60 Minutes Intravenous Every 12 hours 03/26/21 1346 03/28/21 0958   03/24/21 1415  ceFAZolin (ANCEF) IVPB 2g/100 mL premix  Status:  Discontinued        2 g 200 mL/hr over 30 Minutes Intravenous Every 8 hours 03/24/21 1329 03/27/21 1049   03/20/21 2215  linezolid (ZYVOX) IVPB 600 mg  Status:  Discontinued        600 mg 300 mL/hr over 60 Minutes Intravenous Every 12 hours 03/20/21 2208 03/24/21 1329   03/20/21 2215  ceFEPIme (MAXIPIME) 2 g in sodium chloride 0.9 % 100 mL IVPB  Status:  Discontinued        2 g 200 mL/hr over 30 Minutes Intravenous Every 8 hours 03/20/21 2208 03/24/21 1329   03/20/21 1315  clindamycin (CLEOCIN) capsule 300 mg  Status:  Discontinued        300 mg Oral  Once 03/20/21 1306 03/20/21 1338       PRN meds: sodium chloride, sodium chloride, sodium chloride, acetaminophen **OR** acetaminophen, albuterol, alum & mag hydroxide-simeth, guaiFENesin-dextromethorphan, hydrALAZINE, HYDROcodone-acetaminophen, labetalol, LORazepam, magnesium sulfate bolus IVPB, ondansetron **OR** ondansetron (ZOFRAN) IV, phenol, potassium chloride, sodium chloride flush   Objective: Vitals:    03/29/21 0402 03/29/21 0831  BP: (!) 126/45 (!) 121/52  Pulse: 98 99  Resp: 16 17  Temp: 97.7 F (36.5 C) 97.8 F (36.6 C)  SpO2: 99% 100%    Intake/Output Summary (Last 24 hours) at 03/29/2021 1033 Last data filed at 03/28/2021 2330 Gross per 24 hour  Intake 300 ml  Output --  Net 300 ml   Filed Weights   03/21/21 0006 03/26/21 0754  Weight: 62.2 kg 62.2 kg   Weight change:  Body mass index is 18.09 kg/m.   Physical Exam: General exam: Ederly African-American male.  Not in pain or distress.  Restless but not in pain skin: No rashes, lesions or ulcers. HEENT: Atraumatic, normocephalic, no obvious bleeding Lungs: Clear to auscultation bilaterally CVS: Regular rate and rhythm, no murmur GI/Abd soft, nontender, nondistended, bowel sound present CNS: Alert, awake, unclear voice at baseline. psychiatry: Somewhat restless today Extremities: No pedal edema, no calf tenderness, right toenail fell off.  Data Review: I have personally reviewed the laboratory data and studies available.  Recent Labs  Lab 03/24/21 0224 03/25/21 0314 03/26/21 0336 03/27/21 0038 03/29/21 0132  WBC 5.4 5.3 4.5 6.4 6.0  NEUTROABS 3.3 3.3 2.5  --  4.0  HGB 10.0* 11.0* 10.8* 9.0* 7.5*  HCT 33.4* 35.1* 35.2* 29.2* 23.9*  MCV 83.9 80.3 80.9 80.4 79.9*  PLT 175 201 210 209 150   Recent Labs  Lab 03/24/21 0224 03/25/21 0314 03/26/21 0336 03/27/21 0038 03/29/21 0132  NA 137 129* 136 136 136  K 4.0 3.4* 4.3 4.7 3.8  CL 107 98 101 104 104  CO2 22 26 28 26 27   GLUCOSE 117* 123* 113* 97 162*  BUN 9 8 11 13 19   CREATININE 0.73 0.76 0.76 0.74 0.73  CALCIUM 9.3 9.6 9.9 9.7 9.4    F/u labs ordered Unresulted Labs (From admission, onward)     Start     Ordered   03/30/21 0500  CBC with Differential/Platelet  Daily,   R     Question:  Specimen collection method  Answer:  Lab=Lab collect   03/29/21 0755   03/30/21 7939  Basic metabolic panel  Daily,   R     Question:  Specimen collection  method  Answer:  Lab=Lab collect   03/29/21 0755            Signed, Terrilee Croak, MD [facility] 03/29/2021

## 2021-03-29 NOTE — TOC Progression Note (Signed)
Transition of Care [facility]) - Progression Note    Patient Details  Name: Todd Dunn MRN: [id number] Date of Birth: 06-24-1940  Transition of Care [facility]) CM/SW [place], [place] Phone Number: [phone] 03/29/2021, 11:09 AM  Clinical Narrative:     CSW met with pt's granddaughter [name] and provided her with the medicare.gov rating list with the 3 highlighted facilities [facility] and [facility]) that have made a bed offer. CSW was able to answer questions posed by [name].  [name] informed CSW that she wanted to go visit the facilities and give CSW a call back later today with facility choice. CSW notified [name] that pt is close to being DC so a decision would need to be made. [name] had understanding of the process.  TOC team will continue to assist with discharge planning needs.   Expected Discharge Plan: [place] Barriers to Discharge: Continued Medical Work up  Expected Discharge Plan and Services Expected Discharge Plan: [place] In-house Referral: Clinical Social Work     Living arrangements for the past 2 months: Single Family Home                                       Social Determinants of Health (SDOH) Interventions    Readmission Risk Interventions Readmission Risk Prevention Plan 02/17/2021  Transportation Screening Complete  Medication Review [employer]) Complete  PCP or Specialist appointment within 3-5 days of discharge Complete  HRI or [place] Complete  SW Recovery Care/Counseling Consult Complete  [place] Not Applicable

## 2021-03-29 NOTE — Plan of Care (Signed)

## 2021-03-30 DIAGNOSIS — T148XXA Other injury of unspecified body region, initial encounter: Secondary | ICD-10-CM | POA: Diagnosis not present

## 2021-03-30 DIAGNOSIS — L089 Local infection of the skin and subcutaneous tissue, unspecified: Secondary | ICD-10-CM | POA: Diagnosis not present

## 2021-03-30 LAB — CBC WITH DIFFERENTIAL/PLATELET
Abs Immature Granulocytes: 0.02 10*3/uL (ref 0.00–0.07)
Basophils Absolute: 0 10*3/uL (ref 0.0–0.1)
Basophils Relative: 1 %
Eosinophils Absolute: 0 10*3/uL (ref 0.0–0.5)
Eosinophils Relative: 1 %
HCT: 24.8 % — ABNORMAL LOW (ref 39.0–52.0)
Hemoglobin: 7.8 g/dL — ABNORMAL LOW (ref 13.0–17.0)
Immature Granulocytes: 1 %
Lymphocytes Relative: 38 %
Lymphs Abs: 1.5 10*3/uL (ref 0.7–4.0)
MCH: 25.4 pg — ABNORMAL LOW (ref 26.0–34.0)
MCHC: 31.5 g/dL (ref 30.0–36.0)
MCV: 80.8 fL (ref 80.0–100.0)
Monocytes Absolute: 0.4 10*3/uL (ref 0.1–1.0)
Monocytes Relative: 10 %
Neutro Abs: 1.9 10*3/uL (ref 1.7–7.7)
Neutrophils Relative %: 49 %
Platelets: 179 10*3/uL (ref 150–400)
RBC: 3.07 MIL/uL — ABNORMAL LOW (ref 4.22–5.81)
RDW: 16.9 % — ABNORMAL HIGH (ref 11.5–15.5)
WBC: 3.9 10*3/uL — ABNORMAL LOW (ref 4.0–10.5)
nRBC: 0 % (ref 0.0–0.2)

## 2021-03-30 LAB — BASIC METABOLIC PANEL
Anion gap: 7 (ref 5–15)
BUN: 18 mg/dL (ref 8–23)
CO2: 27 mmol/L (ref 22–32)
Calcium: 9.3 mg/dL (ref 8.9–10.3)
Chloride: 103 mmol/L (ref 98–111)
Creatinine, Ser: 0.69 mg/dL (ref 0.61–1.24)
GFR, Estimated: >60 mL/min (ref >60)
Glucose, Bld: 104 mg/dL — ABNORMAL HIGH (ref 70–99)
Potassium: 3.9 mmol/L (ref 3.5–5.1)
Sodium: 137 mmol/L (ref 135–145)

## 2021-03-30 NOTE — TOC Progression Note (Signed)
Transition of Care [facility]) - Progression Note    Patient Details  Name: Todd Dunn MRN: [id number] Date of Birth: [birth year]  Transition of Care [facility]) CM/SW [place], [place] Phone Number: 03/30/2021, 1:37 PM  Clinical Narrative:     CSW spoke with patient's granddaughter,Sandra- CSW introduced self and explained reason for the call. [name] states she wants [facility].   [facility] confirmed they have availability and can admit patient tomorrow if insurance is approved.  Insurance authorization started reference # [id number] Insurance may require updated PT/OT note- last seen 03/27/2021-RN updated  Thurmond Butts, MSW, LCSW Clinical Social Worker    Expected Discharge Plan: Skilled Nursing Facility Barriers to Discharge: Continued Medical Work up  Expected Discharge Plan and Services Expected Discharge Plan: [place] In-house Referral: Clinical Social Work     Living arrangements for the past 2 months: Single Family Home                                       Social Determinants of Health (SDOH) Interventions    Readmission Risk Interventions Readmission Risk Prevention Plan 02/17/2021  Transportation Screening Complete  Medication Review [employer] photographer) Complete  PCP or Specialist appointment within 3-5 days of discharge Complete  HRI or [place] Complete  SW Recovery Care/Counseling Consult Complete  [place] Not Applicable

## 2021-03-30 NOTE — Progress Notes (Signed)
[facility]   Subjective  -no complaints   Objective 120/65 74 98.1 F (36.7 C) (Oral) 17 100%  Intake/Output Summary (Last 24 hours) at 03/30/2021 0754 Last data filed at 03/30/2021 0339 Gross per 24 hour  Intake 360 ml  Output 650 ml  Net -290 ml    Right groin and right leg incisions clean dry and intact Palpable right PT pulse at the ankle  Laboratory Lab Results: Recent Labs    03/29/21 0132 03/30/21 0606  WBC 6.0 3.9*  HGB 7.5* 7.8*  HCT 23.9* 24.8*  PLT 150 179   BMET Recent Labs    03/29/21 0132 03/30/21 0606  NA 136 137  K 3.8 3.9  CL 104 103  CO2 27 27  GLUCOSE 162* 104*  BUN 19 18  CREATININE 0.73 0.69  CALCIUM 9.4 9.3    COAG Lab Results  Component Value Date   INR 1.1 02/14/2021   INR 1.1 01/21/2021   INR 1.2 09/15/2020   No results found for: PTT  Assessment/Planning:  80 year old male status post 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  Incisions look good.  Palpable PT pulse at the ankle.  He has gangrene of the right great toe.  Defer to orthopedics who initially evaluated him for toe amputation.  Marty Heck 03/30/2021 7:54 AM --

## 2021-03-30 NOTE — Care Management Important Message (Signed)
Important Message  Patient Details  Name: JUSTYN LANGHAM MRN: [id number] Date of Birth: 09/30/1940   Medicare Important Message Given:  Yes     Shelda Altes 03/30/2021, 12:25 PM

## 2021-03-30 NOTE — Progress Notes (Signed)
PROGRESS NOTE  Todd Dunn  DOB: 08-18-1940  PCP: Fredrich Romans, [place] ZOX:[id number]  DOA: 03/20/2021  LOS: 10 days  Hospital Day: 41   Chief Complaint  Patient presents with   Wound Check   Toe Pain   Wound Infection    Brief narrative: Todd Dunn is a 80 y.o. male with PMH significant for history of CAD/CABG, ppm for SSS, ICM, CVA with residual left hemiplegia, dysphagia, and dysarthria, neurogenic bladder with chronic Foley, recurrent UTIs and also with SSS s/p pacemaker who lives at home with his granddaughter. Patient was brought to the ED on 10/14 with 2 weeks history of progressive left great toe infection without any antecedent trauma or ulcer.  X-ray right foot concerning for osteomyelitis of the lateral tuft.   Patient was started on cefepime and linezolid. Wound swab culture was sent.. Admitted to hospitalist service Evaluated by orthopedics and vascular surgery. See below for details  Subjective: Patient was seen and examined this morning. Lying on bed.  Not in distress.  Not restless or agitated.  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 -WBC count normal, lactate level normal.  No fever. -Wound culture grew staph aureus. -Patient was initially started on broad-spectrum antibiotics.  Discussed with Dr. Sharol Given on 10/21.  Antibiotics were stopped. -10/24, I paged Dr. Sharol Given to ask if an amputation while in the hospital would be a better option than outpatient follow-up. Recent Labs  Lab 03/25/21 0314 03/26/21 0336 03/27/21 0038 03/29/21 0132 03/30/21 0606  WBC 5.3 4.5 6.4 6.0 3.9*  Critical limb ischemia of right lower extremity Peripheral artery disease -Because of the right great toe wound, gangrene, peripheral artery disease was suspected, vascular surgery consult was obtained. -10/17, patient underwent aortogram and right lower  extremity angiography.  Per report, heavily calcified aorta with patent renal arteries, severely decreased flow, occluded right SFA.  -On 10/20, patient underwent angioplasty of the right posterior tibial artery and bypass of the right common femoral artery to posterior tibial artery with prosthetic graft. -Continue Plavix, statin after his CVA  Ischemic cardiomyopathy with EF 30 to 35% Essential hypertension -Currently heart failure is compensated. -Prior to admission patient was on Lasix, Imdur, lisinopril, hydralazine and amlodipine -Currently he is on Imdur, hydralazine and amlodipine. Lasix and lisinopril are on hold.  Hemodynamically stable  CAD/CABG in 2014 -Currently has no chest pain.  Seen by cardiology.  Continue Plavix, Imdur, statin.  History of SSS s/p pacemaker -currently paced rhythm  History of CVA with residual left hemiplegia, dysarthria, dysphagia -Continue statin and Plavix -Baseline wheelchair-bound, lives at home  UTI related to chronic Foley History of recurrent UTI Neurogenic bladder /chronic indwelling Foley -Urine culture sent on admission grew 70,000 colonies per mL of Proteus mirabilis. -Already on antibiotics.   -Foley changed on 10/14 in the ED. -Continue Proscar and Flomax   Acute metabolic encephalopathy -Intermittent restlessness, agitation.  Currently stable. -Per granddaughter, patient tends to have these episodes when he gets UTI.  She states that his stroke was several years ago and did not leave any residual cognitive deficit or change in mental status. -Patient remains alert, awake this morning although has baseline dysarthria and difficulty comprehension.  Continue to monitor mental status change.  Chronic anemia -Patient seems to be persistently dropping hemoglobin postsurgically.  No surgical site bleeding noted.  No other evidence of bleeding at this time.  Hemoglobin at 7.8 this morning.  Continue to monitor.  Transfuse for less than  7. Recent Labs    01/06/21 1506 01/06/21 2232 03/25/21 0314 03/26/21 0336 03/27/21 0038 03/29/21 0132 03/30/21 0606  HGB 10.1*   < > 11.0* 10.8* 9.0* 7.5* 7.8*  MCV 81.7   < > 80.3 80.9 80.4 79.9* 80.8  VITAMINB12 183  --   --   --   --   --   --   FOLATE 13.8  --   --   --   --   --   --   FERRITIN 31  --   --   --   --   --   --   TIBC 301  --   --   --   --   --   --   IRON 72  --   --   --   --   --   --   RETICCTPCT 1.4  --   --   --   --   --   --    < > = values in this interval not displayed.   Depression/anxiety -Celexa 20 mg at bedtime   Mobility: PT eval postprocedure Code Status:   Code Status: Full Code  Nutritional status: Body mass index is 18.82 kg/m. Nutrition Problem: Severe Malnutrition Etiology: chronic illness Signs/Symptoms: severe muscle depletion, severe fat depletion Diet:  Diet Order             Diet regular Room service appropriate? No; Fluid consistency: Thin  Diet effective now                  DVT prophylaxis:  SCD's Start: 03/26/21 1347 enoxaparin (LOVENOX) injection 40 mg Start: 03/20/21 1600   Antimicrobials: Antibiotics stopped yesterday 10/21 Fluid: None Consultants: Vascular surgery, orthopedics Family Communication: None at bedside  Status is: Inpatient  Remains inpatient appropriate because: Hemoglobin trending down, pending SNF placement  Dispo: The patient is from: Home              Anticipated d/c is to: SNF recommended by PT              Patient currently is not medically stable to d/c.   Difficult to place patient No     Infusions:   sodium chloride 250 mL (03/22/21 7209)   sodium chloride     sodium chloride     sodium chloride     magnesium sulfate bolus IVPB      Scheduled Meds:  amLODipine  10 mg Oral Daily   Chlorhexidine Gluconate Cloth  6 each Topical Daily   clopidogrel  75 mg Oral QHS   docusate sodium  100 mg Oral Daily   enoxaparin (LOVENOX) injection  40 mg Subcutaneous Q24H    famotidine  10 mg Oral Daily   feeding supplement  237 mL Oral TID BM   finasteride  5 mg Oral Daily   hydrALAZINE  50 mg Oral BID   isosorbide mononitrate  60 mg Oral Daily    morphine injection  2 mg Intravenous Once   multivitamin with minerals  1 tablet Oral Daily   pantoprazole  40 mg Oral Daily   rosuvastatin  10 mg Oral Daily   senna-docusate  1 tablet Oral BID   sodium chloride flush  3 mL Intravenous Q12H   sodium chloride flush  3 mL Intravenous Q12H   tamsulosin  0.4 mg Oral Daily    Antimicrobials: Anti-infectives (From admission, onward)  Start     Dose/Rate Route Frequency Ordered Stop   03/26/21 1600  vancomycin (VANCOCIN) IVPB 1000 mg/200 mL premix        1,000 mg 200 mL/hr over 60 Minutes Intravenous Every 12 hours 03/26/21 1346 03/28/21 0958   03/24/21 1415  ceFAZolin (ANCEF) IVPB 2g/100 mL premix  Status:  Discontinued        2 g 200 mL/hr over 30 Minutes Intravenous Every 8 hours 03/24/21 1329 03/27/21 1049   03/20/21 2215  linezolid (ZYVOX) IVPB 600 mg  Status:  Discontinued        600 mg 300 mL/hr over 60 Minutes Intravenous Every 12 hours 03/20/21 2208 03/24/21 1329   03/20/21 2215  ceFEPIme (MAXIPIME) 2 g in sodium chloride 0.9 % 100 mL IVPB  Status:  Discontinued        2 g 200 mL/hr over 30 Minutes Intravenous Every 8 hours 03/20/21 2208 03/24/21 1329   03/20/21 1315  clindamycin (CLEOCIN) capsule 300 mg  Status:  Discontinued        300 mg Oral  Once 03/20/21 1306 03/20/21 1338       PRN meds: sodium chloride, sodium chloride, sodium chloride, acetaminophen **OR** acetaminophen, albuterol, alum & mag hydroxide-simeth, guaiFENesin-dextromethorphan, hydrALAZINE, HYDROcodone-acetaminophen, labetalol, LORazepam, magnesium sulfate bolus IVPB, ondansetron **OR** ondansetron (ZOFRAN) IV, phenol, potassium chloride, sodium chloride flush   Objective: Vitals:   03/30/21 0338 03/30/21 0951  BP: 120/65 (!) 111/51  Pulse: 74 70  Resp: 17 18  Temp: 98.1  F (36.7 C) 98 F (36.7 C)  SpO2: 100% 100%    Intake/Output Summary (Last 24 hours) at 03/30/2021 1149 Last data filed at 03/30/2021 0339 Gross per 24 hour  Intake 240 ml  Output 550 ml  Net -310 ml   Filed Weights   03/21/21 0006 03/26/21 0754 03/30/21 0338  Weight: 62.2 kg 62.2 kg 64.7 kg   Weight change:  Body mass index is 18.82 kg/m.   Physical Exam: General exam: Ederly African-American male.  Not in pain or distress.  Not restless for me this morning skin: No rashes, lesions or ulcers. HEENT: Atraumatic, normocephalic, no obvious bleeding Lungs: Clear to auscultation bilaterally CVS: Regular rate and rhythm, no murmur GI/Abd soft, nontender, nondistended, bowel sound present CNS: Alert, awake, unclear voice at baseline. psychiatry: Somewhat restless today Extremities: No pedal edema, no calf tenderness, right toe with gangrenous changes.  Data Review: I have personally reviewed the laboratory data and studies available.  Recent Labs  Lab 03/24/21 0224 03/25/21 0314 03/26/21 0336 03/27/21 0038 03/29/21 0132 03/30/21 0606  WBC 5.4 5.3 4.5 6.4 6.0 3.9*  NEUTROABS 3.3 3.3 2.5  --  4.0 1.9  HGB 10.0* 11.0* 10.8* 9.0* 7.5* 7.8*  HCT 33.4* 35.1* 35.2* 29.2* 23.9* 24.8*  MCV 83.9 80.3 80.9 80.4 79.9* 80.8  PLT 175 201 210 209 150 179   Recent Labs  Lab 03/25/21 0314 03/26/21 0336 03/27/21 0038 03/29/21 0132 03/30/21 0606  NA 129* 136 136 136 137  K 3.4* 4.3 4.7 3.8 3.9  CL 98 101 104 104 103  CO2 26 28 26 27 27   GLUCOSE 123* 113* 97 162* 104*  BUN 8 11 13 19 18   CREATININE 0.76 0.76 0.74 0.73 0.69  CALCIUM 9.6 9.9 9.7 9.4 9.3    F/u labs ordered Unresulted Labs (From admission, onward)     Start     Ordered   03/30/21 0500  CBC with Differential/Platelet  Daily,   R  Question:  Specimen collection method  Answer:  Lab=Lab collect   03/29/21 0755   03/30/21 9753  Basic metabolic panel  Daily,   R     Question:  Specimen collection method   Answer:  Lab=Lab collect   03/29/21 0755            Signed, Terrilee Croak, MD [facility] 03/30/2021

## 2021-03-31 LAB — CBC WITH DIFFERENTIAL/PLATELET
Abs Immature Granulocytes: 0.02 10*3/uL (ref 0.00–0.07)
Basophils Absolute: 0 10*3/uL (ref 0.0–0.1)
Basophils Relative: 1 %
Eosinophils Absolute: 0 10*3/uL (ref 0.0–0.5)
Eosinophils Relative: 1 %
HCT: 27 % — ABNORMAL LOW (ref 39.0–52.0)
Hemoglobin: 8.2 g/dL — ABNORMAL LOW (ref 13.0–17.0)
Immature Granulocytes: 1 %
Lymphocytes Relative: 36 %
Lymphs Abs: 1.4 10*3/uL (ref 0.7–4.0)
MCH: 25.2 pg — ABNORMAL LOW (ref 26.0–34.0)
MCHC: 30.4 g/dL (ref 30.0–36.0)
MCV: 82.8 fL (ref 80.0–100.0)
Monocytes Absolute: 0.3 10*3/uL (ref 0.1–1.0)
Monocytes Relative: 8 %
Neutro Abs: 2.1 10*3/uL (ref 1.7–7.7)
Neutrophils Relative %: 53 %
Platelets: 216 10*3/uL (ref 150–400)
RBC: 3.26 MIL/uL — ABNORMAL LOW (ref 4.22–5.81)
RDW: 16.9 % — ABNORMAL HIGH (ref 11.5–15.5)
WBC: 3.9 10*3/uL — ABNORMAL LOW (ref 4.0–10.5)
nRBC: 0 % (ref 0.0–0.2)

## 2021-03-31 LAB — BASIC METABOLIC PANEL
Anion gap: 6 (ref 5–15)
BUN: 18 mg/dL (ref 8–23)
CO2: 26 mmol/L (ref 22–32)
Calcium: 9.5 mg/dL (ref 8.9–10.3)
Chloride: 106 mmol/L (ref 98–111)
Creatinine, Ser: 0.72 mg/dL (ref 0.61–1.24)
GFR, Estimated: >60 mL/min (ref >60)
Glucose, Bld: 101 mg/dL — ABNORMAL HIGH (ref 70–99)
Potassium: 3.9 mmol/L (ref 3.5–5.1)
Sodium: 138 mmol/L (ref 135–145)

## 2021-03-31 MED ORDER — CHLORHEXIDINE GLUCONATE 4 % EX LIQD
60.0000 mL | Freq: Once | CUTANEOUS | Status: AC
  Administered 2021-04-01: 4 via TOPICAL
  Filled 2021-03-31: qty 60

## 2021-03-31 MED ORDER — POVIDONE-IODINE 10 % EX SWAB
2.0000 "application " | Freq: Once | CUTANEOUS | Status: AC
  Administered 2021-04-01: 2 via TOPICAL

## 2021-03-31 MED ORDER — MUPIROCIN 2 % EX OINT
1.0000 "application " | TOPICAL_OINTMENT | Freq: Two times a day (BID) | CUTANEOUS | Status: DC
  Administered 2021-04-01 – 2021-04-04 (×7): 1 via NASAL
  Filled 2021-03-31 (×2): qty 22

## 2021-03-31 MED ORDER — CEFAZOLIN SODIUM-DEXTROSE 2-4 GM/100ML-% IV SOLN
2.0000 g | INTRAVENOUS | Status: AC
  Administered 2021-04-01: 2 g via INTRAVENOUS
  Filled 2021-03-31: qty 100

## 2021-03-31 NOTE — Progress Notes (Signed)
Patient ID: Todd Dunn, male   DOB: 02-02-1941, 80 y.o.   MRN: [id number] Patient is seen in follow-up for the right great toe.  Patient is status post revascularization to the right lower extremity and has had progressive gangrenous changes to the right great toe.  Patient's toe is tender to palpation there is no ascending cellulitis.  Discussed with patient recommendation for amputation of the right first ray.  Risk and benefits were discussed including risk of the wound not healing.  Patient states he understands and is agreeable to proceed with surgery tomorrow.

## 2021-03-31 NOTE — Progress Notes (Addendum)
Occupational Therapy Treatment Patient Details Name: Todd Dunn MRN: [id number] DOB: 1941-05-15 Today's Date: 03/31/2021   History of present illness Pt is an 80 y.o. male admitted 03/20/21 with partial avulsion of R great toenail. S/p abdominal aortogram 10/15. S/p RLE angiography, cannulation L femoral artery 10/17. S/p angioplasty, R femoral to posterior tibial artery bypass on 10/20. PMH includes CVA (residual L-side weakness, CAD, pacemaker, HTN, neurogenic bladder (chronic indwelling foley catheter).   OT comments  Pt remains limited by cognitive deficits (attention, safety awareness, and command following). Pt requires consistent multimodal cues for completion of ADLs, able to assist some but grossly still requires Total A for ADLs due to cognition and ataxic movements. Pt able to stand at edge of bed and demo side steps with RW with variable Min A x 2 to Max A x 2 to maintain standing balance due to sways in all directions. Continue to rec SNF rehab as pt is below functional baseline and unable to safely complete self care tasks.   Recommendations for follow up therapy are one component of a multi-disciplinary discharge planning process, led by the attending physician.  Recommendations may be updated based on patient status, additional functional criteria and insurance authorization.    Follow Up Recommendations  Skilled nursing-short term rehab (<3 hours/day)    Assistance Recommended at Discharge Frequent or constant Supervision/Assistance  Equipment Recommendations  Other (comment) (to be determined, pending progress)    Recommendations for Other Services      Precautions / Restrictions Precautions Precautions: Fall;Other (comment) Precaution Comments: impulsive Restrictions Weight Bearing Restrictions: No       Mobility Bed Mobility Overal bed mobility: Needs Assistance Bed Mobility: Supine to Sit;Sit to Supine     Supine to sit: Supervision;HOB elevated Sit  to supine: Min assist   General bed mobility comments: Supervision with consistent cues to initiate task. Min A to get R LE back into bed    Transfers Overall transfer level: Needs assistance Equipment used: Rolling walker (2 wheels) Transfers: Sit to/from Stand Sit to Stand: Min assist;+2 physical assistance;+2 safety/equipment;From elevated surface           General transfer comment: Min A x 2 for sit to stand from bedside using RW. Cues needed for hand placement and to uncross B LE with wider stance     Balance Overall balance assessment: Needs assistance Sitting-balance support: Feet supported;No upper extremity supported Sitting balance-Leahy Scale: Fair Sitting balance - Comments: leaning/falling to R and back, not following commands/cues to correct consistently Postural control: Posterior lean;Right lateral lean Standing balance support: Bilateral upper extremity supported;During functional activity Standing balance-Leahy Scale: Poor Standing balance comment: reliant on UE support and variable Min to Max A to maintain balance with LOB in all directions                           ADL either performed or assessed with clinical judgement   ADL Overall ADL's : Needs assistance/impaired     Grooming: Total assistance;Sitting Grooming Details (indicate cue type and reason): attempted to get pt to initiate brushing teeth. OT placed toothpaste on toothbrush and placed in pt's hand. Pt attempting to bring toothbrush to mouth with decreased coordination and unable to problem solve turning toothbrush correct way. Unable to sustain attention to task             Lower Body Dressing: Total assistance;Bed level Lower Body Dressing Details (indicate cue type and reason): placed socks  on B toes with pt able to bring L foot to self to pull over heel with poor coordination. Unable to mirror same movement with R LE               General ADL Comments: Continues to be  limited by impaired cognition (poor attention, initiation and problem solving). also with questionable ataxic movements that impair coordination with tasks     Vision   Vision Assessment?: No apparent visual deficits   Perception Perception Perception: Impaired (questionable depth perception issues as difficulty bringing toothbrush to mouth)   Praxis Praxis Praxis: Impaired Praxis Impairment Details: Motor planning Praxis-Other Comments: difficulty in initiating tasks without max cues and placing objects in hand. Placed toothbrush in hand but unable to sequence task (or attend to task)    Cognition Arousal/Alertness: Awake/alert Behavior During Therapy: Restless;Impulsive Overall Cognitive Status: No family/caregiver present to determine baseline cognitive functioning Area of Impairment: Orientation;Attention;Memory;Following commands;Safety/judgement;Problem solving;Awareness                 Orientation Level: Disoriented to;Place;Time;Situation Current Attention Level: Focused Memory: Decreased short-term memory Following Commands: Follows one step commands inconsistently;Follows one step commands with increased time Safety/Judgement: Decreased awareness of safety;Decreased awareness of deficits Awareness: Intellectual Problem Solving: Slow processing;Requires verbal cues;Requires tactile cues;Decreased initiation General Comments: Pt with continued impulsivity and restlessness during session, easily distracted and difficult to redirect to tasks. Benefits from multimodal cues to follow directions. Difficult to understand at times due to baseline dysarthria.          Exercises     Shoulder Instructions       General Comments      Pertinent Vitals/ Pain       Pain Assessment: Faces Faces Pain Scale: Hurts a little bit Pain Location: R LE Pain Descriptors / Indicators: Grimacing;Guarding;Other (Comment) (lifting foot to self) Pain Intervention(s): Monitored during  session;Limited activity within patient's tolerance  Home Living                                          Prior Functioning/Environment              Frequency  Min 2X/week        Progress Toward Goals  OT Goals(current goals can now be found in the care plan section)  Progress towards OT goals: OT to reassess next treatment  Acute Rehab OT Goals OT Goal Formulation: With patient Time For Goal Achievement: 04/10/21 Potential to Achieve Goals: Fair ADL Goals Pt Will Perform Eating: with min assist;sitting Pt Will Perform Grooming: with min assist;sitting Pt Will Transfer to Toilet: with min assist;stand pivot transfer;bedside commode Additional ADL Goal #1: Pt to attend to functional tasks > 3 min with min verbal cues to improve overall participation  Plan Discharge plan remains appropriate    Co-evaluation    PT/OT/SLP Co-Evaluation/Treatment: Yes Reason for Co-Treatment: Complexity of the patient's impairments (multi-system involvement);Necessary to address cognition/behavior during functional activity;For patient/therapist safety;To address functional/ADL transfers   OT goals addressed during session: ADL's and self-care      AM-PAC OT "6 Clicks" Daily Activity     Outcome Measure   Help from another person eating meals?: Total Help from another person taking care of personal grooming?: Total Help from another person toileting, which includes using toliet, bedpan, or urinal?: Total Help from another person bathing (including washing, rinsing, drying)?: Total Help from  another person to put on and taking off regular upper body clothing?: Total Help from another person to put on and taking off regular lower body clothing?: Total 6 Click Score: 6    End of Session Equipment Utilized During Treatment: Gait belt;Rolling walker (2 wheels)  OT Visit Diagnosis: Unsteadiness on feet (R26.81);Other abnormalities of gait and mobility (R26.89);Muscle  weakness (generalized) (M62.81);Other symptoms and signs involving cognitive function;Pain Pain - Right/Left: Right Pain - part of body: Leg;Ankle and joints of foot   Activity Tolerance Other (comment) (limited by cognition)   Patient Left in bed;with call bell/phone within reach;with bed alarm set;Other (comment) (fall mats)   Nurse Communication Mobility status        Time: 7035-0093 OT Time Calculation (min): 24 min  Charges: OT General Charges $OT Visit: 1 Visit OT Treatments $Self Care/Home Management : 8-22 mins  Malachy Chamber, OTR/L Acute Rehab Services Office: [phone]   Layla Maw 03/31/2021, 10:52 AM

## 2021-03-31 NOTE — H&P (View-Only) (Signed)
PROGRESS NOTE  Todd Dunn  DOB: 13-Mar-1941  PCP: Fredrich Romans, [place] RDE:[id number]  DOA: 03/20/2021  LOS: 11 days  Hospital Day: 12   Chief Complaint  Patient presents with   Wound Check   Toe Pain   Wound Infection    Brief narrative: Todd Dunn is a 80 y.o. male with PMH significant for history of CAD/CABG, ppm for SSS, ICM, CVA with residual left hemiplegia, dysphagia, and dysarthria, neurogenic bladder with chronic Foley, recurrent UTIs and also with SSS s/p pacemaker who lives at home with his granddaughter. Patient was brought to the ED on 10/14 with 2 weeks history of progressive left great toe infection without any antecedent trauma or ulcer.  X-ray right foot concerning for osteomyelitis of the lateral tuft.   Patient was started on cefepime and linezolid. Wound swab culture was sent.. Admitted to hospitalist service Evaluated by orthopedics and vascular surgery. See below for details  Subjective: Patient was seen and examined this morning. Lying down in bed.  Not in distress.  He was a little restless and calmed down after 1 dose of IV Ativan.  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 -WBC count normal, lactate level normal.  No fever. -Wound culture grew staph aureus. -Patient was initially started on broad-spectrum antibiotics.  On 10/21, antibiotics stopped per discussion with orthopedics. -10/25, patient was seen again by Dr. Sharol Given.  Patient is planned for right to amputation tomorrow Recent Labs  Lab 03/26/21 0336 03/27/21 0038 03/29/21 0132 03/30/21 0606 03/31/21 0207  WBC 4.5 6.4 6.0 3.9* 3.9*   Critical limb ischemia of right lower extremity Peripheral artery disease -Because of the right great toe wound, gangrene, peripheral artery disease was suspected, vascular surgery consult was obtained. -10/17, patient underwent  aortogram and right lower extremity angiography.  Per report, heavily calcified aorta with patent renal arteries, severely decreased flow, occluded right SFA.  -On 10/20, patient underwent angioplasty of the right posterior tibial artery and bypass of the right common femoral artery to posterior tibial artery with prosthetic graft. -Continue Plavix, statin after his CVA  Ischemic cardiomyopathy with EF 30 to 35% Essential hypertension -Currently heart failure is compensated. -Prior to admission patient was on Lasix, Imdur, lisinopril, hydralazine and amlodipine -Currently he is on Imdur, hydralazine and amlodipine. Lasix and lisinopril are on hold.  Hemodynamically stable  CAD/CABG in 2014 -Currently has no chest pain.  Seen by cardiology. Continue Plavix, Imdur, statin.  History of SSS s/p pacemaker -currently paced rhythm  History of CVA with residual left hemiplegia, dysarthria, dysphagia -Continue statin and Plavix -Baseline wheelchair-bound, lives at home  UTI related to chronic Foley History of recurrent UTI Neurogenic bladder /chronic indwelling Foley -Urine culture sent on admission grew 70,000 colonies per mL of Proteus mirabilis. -Already on antibiotics.   -Foley changed on 10/14 in the ED. -Continue Proscar and Flomax   Acute metabolic encephalopathy -Intermittent restlessness, agitation.  Currently stable. -Per granddaughter, patient tends to have these episodes when he gets UTI.  She states that his stroke was several years ago and did not leave any residual cognitive deficit or change in mental status. -Patient remains alert, awake this morning although has baseline dysarthria and difficulty comprehension.  Continue to monitor mental status change.  Chronic anemia -Hemoglobin seems to be running low postsurgically.  No surgical site bleeding noted.  No other evidence of bleeding at this time.  Improved hemoglobin  in last 24 hours, at 8.2 today. Continue to monitor.   Transfuse for less than 7. Recent Labs    01/06/21 1506 01/06/21 2232 03/26/21 0336 03/27/21 0038 03/29/21 0132 03/30/21 0606 03/31/21 0207  HGB 10.1*   < > 10.8* 9.0* 7.5* 7.8* 8.2*  MCV 81.7   < > 80.9 80.4 79.9* 80.8 82.8  VITAMINB12 183  --   --   --   --   --   --   FOLATE 13.8  --   --   --   --   --   --   FERRITIN 31  --   --   --   --   --   --   TIBC 301  --   --   --   --   --   --   IRON 72  --   --   --   --   --   --   RETICCTPCT 1.4  --   --   --   --   --   --    < > = values in this interval not displayed.    Depression/anxiety -Celexa 20 mg at bedtime   Mobility: PT eval postprocedure Code Status:   Code Status: Full Code  Nutritional status: Body mass index is 18.59 kg/m. Nutrition Problem: Severe Malnutrition Etiology: chronic illness Signs/Symptoms: severe muscle depletion, severe fat depletion Diet:  Diet Order             Diet NPO time specified  Diet effective ____           Diet regular Room service appropriate? No; Fluid consistency: Thin  Diet effective now                  DVT prophylaxis:  SCD's Start: 03/26/21 1347 enoxaparin (LOVENOX) injection 40 mg Start: 03/20/21 1600   Antimicrobials: None at this time Fluid: None Consultants: Vascular surgery, orthopedics Family Communication: None at bedside  Status is: Inpatient  Remains inpatient appropriate because: Right toe amputation tomorrow 10/26, pending SNF  Dispo: The patient is from: Home              Anticipated d/c is to: SNF recommended by PT              Patient currently is not medically stable to d/c.   Difficult to place patient No     Infusions:   sodium chloride 250 mL (03/22/21 3491)   sodium chloride     sodium chloride     sodium chloride     magnesium sulfate bolus IVPB      Scheduled Meds:  amLODipine  10 mg Oral Daily   Chlorhexidine Gluconate Cloth  6 each Topical Daily   clopidogrel  75 mg Oral QHS   docusate sodium  100 mg Oral Daily    enoxaparin (LOVENOX) injection  40 mg Subcutaneous Q24H   famotidine  10 mg Oral Daily   feeding supplement  237 mL Oral TID BM   finasteride  5 mg Oral Daily   hydrALAZINE  50 mg Oral BID   isosorbide mononitrate  60 mg Oral Daily    morphine injection  2 mg Intravenous Once   multivitamin with minerals  1 tablet Oral Daily   pantoprazole  40 mg Oral Daily   rosuvastatin  10 mg Oral Daily   senna-docusate  1 tablet Oral BID   sodium chloride flush  3 mL Intravenous Q12H  sodium chloride flush  3 mL Intravenous Q12H   tamsulosin  0.4 mg Oral Daily    Antimicrobials: Anti-infectives (From admission, onward)    Start     Dose/Rate Route Frequency Ordered Stop   03/26/21 1600  vancomycin (VANCOCIN) IVPB 1000 mg/200 mL premix        1,000 mg 200 mL/hr over 60 Minutes Intravenous Every 12 hours 03/26/21 1346 03/28/21 0958   03/24/21 1415  ceFAZolin (ANCEF) IVPB 2g/100 mL premix  Status:  Discontinued        2 g 200 mL/hr over 30 Minutes Intravenous Every 8 hours 03/24/21 1329 03/27/21 1049   03/20/21 2215  linezolid (ZYVOX) IVPB 600 mg  Status:  Discontinued        600 mg 300 mL/hr over 60 Minutes Intravenous Every 12 hours 03/20/21 2208 03/24/21 1329   03/20/21 2215  ceFEPIme (MAXIPIME) 2 g in sodium chloride 0.9 % 100 mL IVPB  Status:  Discontinued        2 g 200 mL/hr over 30 Minutes Intravenous Every 8 hours 03/20/21 2208 03/24/21 1329   03/20/21 1315  clindamycin (CLEOCIN) capsule 300 mg  Status:  Discontinued        300 mg Oral  Once 03/20/21 1306 03/20/21 1338       PRN meds: sodium chloride, sodium chloride, sodium chloride, acetaminophen **OR** acetaminophen, albuterol, alum & mag hydroxide-simeth, guaiFENesin-dextromethorphan, hydrALAZINE, HYDROcodone-acetaminophen, labetalol, LORazepam, magnesium sulfate bolus IVPB, ondansetron **OR** ondansetron (ZOFRAN) IV, phenol, potassium chloride, sodium chloride flush   Objective: Vitals:   03/31/21 0732 03/31/21 1043  BP:  114/70 (!) 116/51  Pulse: 77 84  Resp: 20 20  Temp: 97.9 F (36.6 C) 97.6 F (36.4 C)  SpO2: 100% 100%    Intake/Output Summary (Last 24 hours) at 03/31/2021 1241 Last data filed at 03/31/2021 0910 Gross per 24 hour  Intake 480 ml  Output 1250 ml  Net -770 ml    Filed Weights   03/26/21 0754 03/30/21 0338 03/31/21 0351  Weight: 62.2 kg 64.7 kg 63.9 kg   Weight change: -0.8 kg Body mass index is 18.59 kg/m.   Physical Exam: General exam: Ederly African-American male.  Not in pain or distress.  Slightly restless this morning skin: No rashes, lesions or ulcers. HEENT: Atraumatic, normocephalic, no obvious bleeding Lungs: Clear to auscultation bilaterally CVS: Regular rate and rhythm, no murmur GI/Abd soft, nontender, nondistended, bowel sound present CNS: Alert, awake, unclear voice at baseline. psychiatry: Somewhat restless today Extremities: No pedal edema, no calf tenderness, right toe with gangrenous changes.  Data Review: I have personally reviewed the laboratory data and studies available.  Recent Labs  Lab 03/25/21 0314 03/26/21 0336 03/27/21 0038 03/29/21 0132 03/30/21 0606 03/31/21 0207  WBC 5.3 4.5 6.4 6.0 3.9* 3.9*  NEUTROABS 3.3 2.5  --  4.0 1.9 2.1  HGB 11.0* 10.8* 9.0* 7.5* 7.8* 8.2*  HCT 35.1* 35.2* 29.2* 23.9* 24.8* 27.0*  MCV 80.3 80.9 80.4 79.9* 80.8 82.8  PLT 201 210 209 150 179 216    Recent Labs  Lab 03/26/21 0336 03/27/21 0038 03/29/21 0132 03/30/21 0606 03/31/21 0207  NA 136 136 136 137 138  K 4.3 4.7 3.8 3.9 3.9  CL 101 104 104 103 106  CO2 28 26 27 27 26   GLUCOSE 113* 97 162* 104* 101*  BUN 11 13 19 18 18   CREATININE 0.76 0.74 0.73 0.69 0.72  CALCIUM 9.9 9.7 9.4 9.3 9.5     F/u labs ordered [employer] (From  admission, onward)     Start     Ordered   03/30/21 0500  CBC with Differential/Platelet  Daily,   R     Question:  Specimen collection method  Answer:  Lab=Lab collect   03/29/21 0755   03/30/21 5537  Basic  metabolic panel  Daily,   R     Question:  Specimen collection method  Answer:  Lab=Lab collect   03/29/21 0755            Signed, Terrilee Croak, MD [facility] 03/31/2021

## 2021-03-31 NOTE — Progress Notes (Signed)
[facility]  Subjective  -no complaints   Objective (!) 120/55 71 97.7 F (36.5 C) (Oral) 20 99%  Intake/Output Summary (Last 24 hours) at 03/31/2021 0643 Last data filed at 03/31/2021 0353 Gross per 24 hour  Intake 240 ml  Output 1250 ml  Net -1010 ml    Right groin and right leg incisions clean dry and intact Palpable right PT pulse at the ankle Right great toe dry gangrene  Laboratory Lab Results: Recent Labs    03/30/21 0606 03/31/21 0207  WBC 3.9* 3.9*  HGB 7.8* 8.2*  HCT 24.8* 27.0*  PLT 179 216   BMET Recent Labs    03/30/21 0606 03/31/21 0207  NA 137 138  K 3.9 3.9  CL 103 106  CO2 27 26  GLUCOSE 104* 101*  BUN 18 18  CREATININE 0.69 0.72  CALCIUM 9.3 9.5    COAG Lab Results  Component Value Date   INR 1.1 02/14/2021   INR 1.1 01/21/2021   INR 1.2 09/15/2020   No results found for: PTT  Assessment/Planning:  80 year old male status post 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  Incisions continue to look good.  Palpable PT pulse at the ankle right ankle.  He has gangrene of the right great toe and defer to orthopedics who initially evaluated him for toe amputation.  Looks good from vascular standpoint.  Will arrange follow-up in 3 weeks for incision check.  Marty Heck 03/31/2021 6:43 AM --

## 2021-03-31 NOTE — Progress Notes (Signed)
Physical Therapy Treatment Patient Details Name: Todd Dunn MRN: [id number] DOB: June 20, 1940 Today's Date: 03/31/2021   History of Present Illness Pt is an 80 y.o. male admitted 03/20/21 with partial avulsion of R great toenail. S/p abdominal aortogram 10/15. S/p RLE angiography, cannulation L femoral artery 10/17. S/p angioplasty, R femoral to posterior tibial artery bypass on 10/20. PMH includes CVA (residual L-side weakness, CAD, pacemaker, HTN, neurogenic bladder (chronic indwelling foley catheter).   PT Comments    Today's session focused on transfer and gait training, pt able to take a few steps with RW and min-maxA+2 to maintain stability; pt restless and impulsive, difficult to redirect during session. Pt limited by generalized weakness, poor balance strategies/postural reactions and cognitive deficits, including decreased attention, safety awareness and command following. Continue to recommend SNF-level therapies to maximize functional mobility and independence prior to return home.    Recommendations for follow up therapy are one component of a multi-disciplinary discharge planning process, led by the attending physician.  Recommendations may be updated based on patient status, additional functional criteria and insurance authorization.  Follow Up Recommendations  Skilled nursing-short term rehab (<3 hours/day)     Assistance Recommended at Discharge Frequent or constant Supervision/Assistance  Equipment Recommendations   (defer)    Recommendations for Other Services       Precautions / Restrictions Precautions Precautions: Fall Precaution Comments: impulsive Restrictions Weight Bearing Restrictions: No     Mobility  Bed Mobility Overal bed mobility: Needs Assistance Bed Mobility: Supine to Sit;Sit to Supine     Supine to sit: Supervision;HOB elevated Sit to supine: Min assist   General bed mobility comments: Supervision with consistent cues to initiate  task. Min A to get R LE back into bed    Transfers Overall transfer level: Needs assistance Equipment used: Rolling walker (2 wheels) Transfers: Sit to/from Stand Sit to Stand: Mod assist;+2 physical assistance;+2 safety/equipment;From elevated surface           General transfer comment: ModA+2 for trunk elevation standing from EOB to RW, pt with posterior lean and attempting to keep BLEs crossed; multimodal cues for hand placement and BLE positioning    Ambulation/Gait Ambulation/Gait assistance: Min assist;Mod assist;Max assist;+2 physical assistance;+2 safety/equipment Gait Distance (Feet): 2 Feet Assistive device: Rolling walker (2 wheels) Gait Pattern/deviations: Step-to pattern;Shuffle;Ataxic;Trunk flexed;Narrow base of support;Leaning posteriorly Gait velocity: Decreased   General Gait Details: Very unsteady steps forwards/backwards and side steps towards HOB with RW, pt fluctuating from needing min-maxA+2 to maintain upright standing; posterior lean with ataxic-like gait, requiring frequent cues and external assist to uncross BLEs; impulsive with movement and difficult to redirect attention to complete task   Stairs             Wheelchair Mobility    Modified Rankin (Stroke Patients Only)       Balance Overall balance assessment: Needs assistance Sitting-balance support: Feet supported;No upper extremity supported Sitting balance-Leahy Scale: Fair Sitting balance - Comments: inconsistent ability to maintain static sitting; not following commands/cues to correct consistently Postural control: Posterior lean;Right lateral lean Standing balance support: Bilateral upper extremity supported;During functional activity Standing balance-Leahy Scale: Poor Standing balance comment: reliant on UE support and variable Min to Max A to maintain balance with LOB in all directions                            Cognition Arousal/Alertness: Awake/alert Behavior  During Therapy: Restless;Impulsive Overall Cognitive Status: No family/caregiver present to determine  baseline cognitive functioning Area of Impairment: Orientation;Attention;Memory;Following commands;Safety/judgement;Problem solving;Awareness                 Orientation Level: Disoriented to;Place;Time;Situation Current Attention Level: Focused Memory: Decreased short-term memory Following Commands: Follows one step commands inconsistently;Follows one step commands with increased time Safety/Judgement: Decreased awareness of safety;Decreased awareness of deficits Awareness: Intellectual Problem Solving: Slow processing;Requires verbal cues;Requires tactile cues;Decreased initiation General Comments: Pt with continued impulsivity and restlessness during session, easily distracted and difficult to redirect to tasks. Benefits from multimodal cues to follow directions. Difficult to understand at times due to baseline dysarthria.        Exercises      General Comments General comments (skin integrity, edema, etc.): Attempted to engage pt in seated ADL task at EOB (brushing teeth), pt requiring assist for set-up, able to hold toothbrush and bring towards mouth but consistently undershooting mouth and unable to get brush to teeth      Pertinent Vitals/Pain Pain Assessment: Faces Faces Pain Scale: Hurts a little bit Pain Location: R LE Pain Descriptors / Indicators: Grimacing;Guarding;Other (Comment) (lifting foot to self) Pain Intervention(s): Monitored during session;Limited activity within patient's tolerance    Home Living                          Prior Function            PT Goals (current goals can now be found in the care plan section) Progress towards PT goals: Progressing toward goals    Frequency    Min 2X/week      PT Plan Current plan remains appropriate    Co-evaluation PT/OT/SLP Co-Evaluation/Treatment: Yes Reason for Co-Treatment:  Complexity of the patient's impairments (multi-system involvement);Necessary to address cognition/behavior during functional activity;For patient/therapist safety;To address functional/ADL transfers   OT goals addressed during session: ADL's and self-care      AM-PAC PT "6 Clicks" Mobility   Outcome Measure  Help needed turning from your back to your side while in a flat bed without using bedrails?: A Little Help needed moving from lying on your back to sitting on the side of a flat bed without using bedrails?: A Lot Help needed moving to and from a bed to a chair (including a wheelchair)?: A Lot Help needed standing up from a chair using your arms (e.g., wheelchair or bedside chair)?: A Lot Help needed to walk in hospital room?: A Lot Help needed climbing 3-5 steps with a railing? : Total 6 Click Score: 12    End of Session Equipment Utilized During Treatment: Gait belt Activity Tolerance: Patient tolerated treatment well;Other (comment) (limited ability to participate purposefully) Patient left: in bed;with call bell/phone within reach;with bed alarm set Nurse Communication: Mobility status PT Visit Diagnosis: Other abnormalities of gait and mobility (R26.89);Muscle weakness (generalized) (M62.81)     Time: 1000-1024 PT Time Calculation (min) (ACUTE ONLY): 24 min  Charges:  $Therapeutic Activity: 8-22 mins                     Mabeline Caras, PT, DPT Acute Rehabilitation Services  Pager [phone] Office [phone]  Derry Lory 03/31/2021, 12:47 PM

## 2021-03-31 NOTE — Progress Notes (Signed)
PROGRESS NOTE  Todd Dunn  DOB: 1941-03-17  PCP: Fredrich Romans, [place] RJJ:[id number]  DOA: 03/20/2021  LOS: 11 days  Hospital Day: 12   Chief Complaint  Patient presents with   Wound Check   Toe Pain   Wound Infection    Brief narrative: Todd Dunn is a 80 y.o. male with PMH significant for history of CAD/CABG, ppm for SSS, ICM, CVA with residual left hemiplegia, dysphagia, and dysarthria, neurogenic bladder with chronic Foley, recurrent UTIs and also with SSS s/p pacemaker who lives at home with his granddaughter. Patient was brought to the ED on 10/14 with 2 weeks history of progressive left great toe infection without any antecedent trauma or ulcer.  X-ray right foot concerning for osteomyelitis of the lateral tuft.   Patient was started on cefepime and linezolid. Wound swab culture was sent.. Admitted to hospitalist service Evaluated by orthopedics and vascular surgery. See below for details  Subjective: Patient was seen and examined this morning. Lying down in bed.  Not in distress.  He was a little restless and calmed down after 1 dose of IV Ativan.  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 -WBC count normal, lactate level normal.  No fever. -Wound culture grew staph aureus. -Patient was initially started on broad-spectrum antibiotics.  On 10/21, antibiotics stopped per discussion with orthopedics. -10/25, patient was seen again by Dr. Sharol Given.  Patient is planned for right to amputation tomorrow Recent Labs  Lab 03/26/21 0336 03/27/21 0038 03/29/21 0132 03/30/21 0606 03/31/21 0207  WBC 4.5 6.4 6.0 3.9* 3.9*   Critical limb ischemia of right lower extremity Peripheral artery disease -Because of the right great toe wound, gangrene, peripheral artery disease was suspected, vascular surgery consult was obtained. -10/17, patient underwent  aortogram and right lower extremity angiography.  Per report, heavily calcified aorta with patent renal arteries, severely decreased flow, occluded right SFA.  -On 10/20, patient underwent angioplasty of the right posterior tibial artery and bypass of the right common femoral artery to posterior tibial artery with prosthetic graft. -Continue Plavix, statin after his CVA  Ischemic cardiomyopathy with EF 30 to 35% Essential hypertension -Currently heart failure is compensated. -Prior to admission patient was on Lasix, Imdur, lisinopril, hydralazine and amlodipine -Currently he is on Imdur, hydralazine and amlodipine. Lasix and lisinopril are on hold.  Hemodynamically stable  CAD/CABG in 2014 -Currently has no chest pain.  Seen by cardiology. Continue Plavix, Imdur, statin.  History of SSS s/p pacemaker -currently paced rhythm  History of CVA with residual left hemiplegia, dysarthria, dysphagia -Continue statin and Plavix -Baseline wheelchair-bound, lives at home  UTI related to chronic Foley History of recurrent UTI Neurogenic bladder /chronic indwelling Foley -Urine culture sent on admission grew 70,000 colonies per mL of Proteus mirabilis. -Already on antibiotics.   -Foley changed on 10/14 in the ED. -Continue Proscar and Flomax   Acute metabolic encephalopathy -Intermittent restlessness, agitation.  Currently stable. -Per granddaughter, patient tends to have these episodes when he gets UTI.  She states that his stroke was several years ago and did not leave any residual cognitive deficit or change in mental status. -Patient remains alert, awake this morning although has baseline dysarthria and difficulty comprehension.  Continue to monitor mental status change.  Chronic anemia -Hemoglobin seems to be running low postsurgically.  No surgical site bleeding noted.  No other evidence of bleeding at this time.  Improved hemoglobin  in last 24 hours, at 8.2 today. Continue to monitor.   Transfuse for less than 7. Recent Labs    01/06/21 1506 01/06/21 2232 03/26/21 0336 03/27/21 0038 03/29/21 0132 03/30/21 0606 03/31/21 0207  HGB 10.1*   < > 10.8* 9.0* 7.5* 7.8* 8.2*  MCV 81.7   < > 80.9 80.4 79.9* 80.8 82.8  VITAMINB12 183  --   --   --   --   --   --   FOLATE 13.8  --   --   --   --   --   --   FERRITIN 31  --   --   --   --   --   --   TIBC 301  --   --   --   --   --   --   IRON 72  --   --   --   --   --   --   RETICCTPCT 1.4  --   --   --   --   --   --    < > = values in this interval not displayed.    Depression/anxiety -Celexa 20 mg at bedtime   Mobility: PT eval postprocedure Code Status:   Code Status: Full Code  Nutritional status: Body mass index is 18.59 kg/m. Nutrition Problem: Severe Malnutrition Etiology: chronic illness Signs/Symptoms: severe muscle depletion, severe fat depletion Diet:  Diet Order             Diet NPO time specified  Diet effective ____           Diet regular Room service appropriate? No; Fluid consistency: Thin  Diet effective now                  DVT prophylaxis:  SCD's Start: 03/26/21 1347 enoxaparin (LOVENOX) injection 40 mg Start: 03/20/21 1600   Antimicrobials: None at this time Fluid: None Consultants: Vascular surgery, orthopedics Family Communication: None at bedside  Status is: Inpatient  Remains inpatient appropriate because: Right toe amputation tomorrow 10/26, pending SNF  Dispo: The patient is from: Home              Anticipated d/c is to: SNF recommended by PT              Patient currently is not medically stable to d/c.   Difficult to place patient No     Infusions:   sodium chloride 250 mL (03/22/21 3818)   sodium chloride     sodium chloride     sodium chloride     magnesium sulfate bolus IVPB      Scheduled Meds:  amLODipine  10 mg Oral Daily   Chlorhexidine Gluconate Cloth  6 each Topical Daily   clopidogrel  75 mg Oral QHS   docusate sodium  100 mg Oral Daily    enoxaparin (LOVENOX) injection  40 mg Subcutaneous Q24H   famotidine  10 mg Oral Daily   feeding supplement  237 mL Oral TID BM   finasteride  5 mg Oral Daily   hydrALAZINE  50 mg Oral BID   isosorbide mononitrate  60 mg Oral Daily    morphine injection  2 mg Intravenous Once   multivitamin with minerals  1 tablet Oral Daily   pantoprazole  40 mg Oral Daily   rosuvastatin  10 mg Oral Daily   senna-docusate  1 tablet Oral BID   sodium chloride flush  3 mL Intravenous Q12H  sodium chloride flush  3 mL Intravenous Q12H   tamsulosin  0.4 mg Oral Daily    Antimicrobials: Anti-infectives (From admission, onward)    Start     Dose/Rate Route Frequency Ordered Stop   03/26/21 1600  vancomycin (VANCOCIN) IVPB 1000 mg/200 mL premix        1,000 mg 200 mL/hr over 60 Minutes Intravenous Every 12 hours 03/26/21 1346 03/28/21 0958   03/24/21 1415  ceFAZolin (ANCEF) IVPB 2g/100 mL premix  Status:  Discontinued        2 g 200 mL/hr over 30 Minutes Intravenous Every 8 hours 03/24/21 1329 03/27/21 1049   03/20/21 2215  linezolid (ZYVOX) IVPB 600 mg  Status:  Discontinued        600 mg 300 mL/hr over 60 Minutes Intravenous Every 12 hours 03/20/21 2208 03/24/21 1329   03/20/21 2215  ceFEPIme (MAXIPIME) 2 g in sodium chloride 0.9 % 100 mL IVPB  Status:  Discontinued        2 g 200 mL/hr over 30 Minutes Intravenous Every 8 hours 03/20/21 2208 03/24/21 1329   03/20/21 1315  clindamycin (CLEOCIN) capsule 300 mg  Status:  Discontinued        300 mg Oral  Once 03/20/21 1306 03/20/21 1338       PRN meds: sodium chloride, sodium chloride, sodium chloride, acetaminophen **OR** acetaminophen, albuterol, alum & mag hydroxide-simeth, guaiFENesin-dextromethorphan, hydrALAZINE, HYDROcodone-acetaminophen, labetalol, LORazepam, magnesium sulfate bolus IVPB, ondansetron **OR** ondansetron (ZOFRAN) IV, phenol, potassium chloride, sodium chloride flush   Objective: Vitals:   03/31/21 0732 03/31/21 1043  BP:  114/70 (!) 116/51  Pulse: 77 84  Resp: 20 20  Temp: 97.9 F (36.6 C) 97.6 F (36.4 C)  SpO2: 100% 100%    Intake/Output Summary (Last 24 hours) at 03/31/2021 1241 Last data filed at 03/31/2021 0910 Gross per 24 hour  Intake 480 ml  Output 1250 ml  Net -770 ml    Filed Weights   03/26/21 0754 03/30/21 0338 03/31/21 0351  Weight: 62.2 kg 64.7 kg 63.9 kg   Weight change: -0.8 kg Body mass index is 18.59 kg/m.   Physical Exam: General exam: Ederly African-American male.  Not in pain or distress.  Slightly restless this morning skin: No rashes, lesions or ulcers. HEENT: Atraumatic, normocephalic, no obvious bleeding Lungs: Clear to auscultation bilaterally CVS: Regular rate and rhythm, no murmur GI/Abd soft, nontender, nondistended, bowel sound present CNS: Alert, awake, unclear voice at baseline. psychiatry: Somewhat restless today Extremities: No pedal edema, no calf tenderness, right toe with gangrenous changes.  Data Review: I have personally reviewed the laboratory data and studies available.  Recent Labs  Lab 03/25/21 0314 03/26/21 0336 03/27/21 0038 03/29/21 0132 03/30/21 0606 03/31/21 0207  WBC 5.3 4.5 6.4 6.0 3.9* 3.9*  NEUTROABS 3.3 2.5  --  4.0 1.9 2.1  HGB 11.0* 10.8* 9.0* 7.5* 7.8* 8.2*  HCT 35.1* 35.2* 29.2* 23.9* 24.8* 27.0*  MCV 80.3 80.9 80.4 79.9* 80.8 82.8  PLT 201 210 209 150 179 216    Recent Labs  Lab 03/26/21 0336 03/27/21 0038 03/29/21 0132 03/30/21 0606 03/31/21 0207  NA 136 136 136 137 138  K 4.3 4.7 3.8 3.9 3.9  CL 101 104 104 103 106  CO2 28 26 27 27 26   GLUCOSE 113* 97 162* 104* 101*  BUN 11 13 19 18 18   CREATININE 0.76 0.74 0.73 0.69 0.72  CALCIUM 9.9 9.7 9.4 9.3 9.5     F/u labs ordered [employer] (From  admission, onward)     Start     Ordered   03/30/21 0500  CBC with Differential/Platelet  Daily,   R     Question:  Specimen collection method  Answer:  Lab=Lab collect   03/29/21 0755   03/30/21 3295  Basic  metabolic panel  Daily,   R     Question:  Specimen collection method  Answer:  Lab=Lab collect   03/29/21 0755            Signed, Terrilee Croak, MD [facility] 03/31/2021

## 2021-04-01 ENCOUNTER — Encounter (HOSPITAL_COMMUNITY): Payer: Self-pay | Admitting: Internal Medicine

## 2021-04-01 ENCOUNTER — Inpatient Hospital Stay (HOSPITAL_COMMUNITY): Payer: Medicare Other | Admitting: Certified Registered"

## 2021-04-01 ENCOUNTER — Encounter (HOSPITAL_COMMUNITY)
Admission: EM | Disposition: A | Discharge status: Skilled Nursing Facility | Payer: Self-pay | Source: Home / Self Care | Attending: Internal Medicine

## 2021-04-01 DIAGNOSIS — L089 Local infection of the skin and subcutaneous tissue, unspecified: Secondary | ICD-10-CM | POA: Diagnosis not present

## 2021-04-01 DIAGNOSIS — T148XXA Other injury of unspecified body region, initial encounter: Secondary | ICD-10-CM | POA: Diagnosis not present

## 2021-04-01 DIAGNOSIS — M869 Osteomyelitis, unspecified: Secondary | ICD-10-CM | POA: Diagnosis not present

## 2021-04-01 HISTORY — PX: AMPUTATION: SHX166

## 2021-04-01 HISTORY — PX: APPLICATION OF WOUND VAC: SHX5189

## 2021-04-01 LAB — BASIC METABOLIC PANEL
Anion gap: 4 — ABNORMAL LOW (ref 5–15)
BUN: 13 mg/dL (ref 8–23)
CO2: 29 mmol/L (ref 22–32)
Calcium: 9.7 mg/dL (ref 8.9–10.3)
Chloride: 106 mmol/L (ref 98–111)
Creatinine, Ser: 0.68 mg/dL (ref 0.61–1.24)
GFR, Estimated: >60 mL/min (ref >60)
Glucose, Bld: 123 mg/dL — ABNORMAL HIGH (ref 70–99)
Potassium: 4 mmol/L (ref 3.5–5.1)
Sodium: 139 mmol/L (ref 135–145)

## 2021-04-01 LAB — CBC WITH DIFFERENTIAL/PLATELET
Abs Immature Granulocytes: 0.03 10*3/uL (ref 0.00–0.07)
Basophils Absolute: 0 10*3/uL (ref 0.0–0.1)
Basophils Relative: 1 %
Eosinophils Absolute: 0 10*3/uL (ref 0.0–0.5)
Eosinophils Relative: 1 %
HCT: 25.7 % — ABNORMAL LOW (ref 39.0–52.0)
Hemoglobin: 8 g/dL — ABNORMAL LOW (ref 13.0–17.0)
Immature Granulocytes: 1 %
Lymphocytes Relative: 33 %
Lymphs Abs: 1.4 10*3/uL (ref 0.7–4.0)
MCH: 25.2 pg — ABNORMAL LOW (ref 26.0–34.0)
MCHC: 31.1 g/dL (ref 30.0–36.0)
MCV: 80.8 fL (ref 80.0–100.0)
Monocytes Absolute: 0.3 10*3/uL (ref 0.1–1.0)
Monocytes Relative: 8 %
Neutro Abs: 2.4 10*3/uL (ref 1.7–7.7)
Neutrophils Relative %: 56 %
Platelets: 236 10*3/uL (ref 150–400)
RBC: 3.18 MIL/uL — ABNORMAL LOW (ref 4.22–5.81)
RDW: 16.6 % — ABNORMAL HIGH (ref 11.5–15.5)
WBC: 4.1 10*3/uL (ref 4.0–10.5)
nRBC: 0 % (ref 0.0–0.2)

## 2021-04-01 LAB — SURGICAL PCR SCREEN
MRSA, PCR: NEGATIVE
Staphylococcus aureus: NEGATIVE

## 2021-04-01 SURGERY — AMPUTATION DIGIT
Anesthesia: Monitor Anesthesia Care | Laterality: Right

## 2021-04-01 MED ORDER — FENTANYL CITRATE (PF) 100 MCG/2ML IJ SOLN
25.0000 ug | INTRAMUSCULAR | Status: DC | PRN
  Administered 2021-04-01 (×2): 50 ug via INTRAVENOUS

## 2021-04-01 MED ORDER — ZINC SULFATE 220 (50 ZN) MG PO CAPS
220.0000 mg | ORAL_CAPSULE | Freq: Every day | ORAL | Status: DC
  Administered 2021-04-01 – 2021-04-05 (×5): 220 mg via ORAL
  Filled 2021-04-01 (×5): qty 1

## 2021-04-01 MED ORDER — FENTANYL CITRATE (PF) 100 MCG/2ML IJ SOLN
INTRAMUSCULAR | Status: AC
  Filled 2021-04-01: qty 2

## 2021-04-01 MED ORDER — BUPIVACAINE HCL (PF) 0.5 % IJ SOLN
INTRAMUSCULAR | Status: DC | PRN
  Administered 2021-04-01: 30 mL via PERINEURAL

## 2021-04-01 MED ORDER — OXYCODONE HCL 5 MG PO TABS: 5.0000 mg | ORAL_TABLET | Freq: Once | ORAL | Status: DC | PRN

## 2021-04-01 MED ORDER — HYDRALAZINE HCL 20 MG/ML IJ SOLN: 5.0000 mg | INTRAMUSCULAR | Status: DC | PRN

## 2021-04-01 MED ORDER — ASCORBIC ACID 500 MG PO TABS
1000.0000 mg | ORAL_TABLET | Freq: Every day | ORAL | Status: DC
  Administered 2021-04-01 – 2021-04-05 (×5): 1000 mg via ORAL
  Filled 2021-04-01 (×5): qty 2

## 2021-04-01 MED ORDER — 0.9 % SODIUM CHLORIDE (POUR BTL) OPTIME
TOPICAL | Status: DC | PRN
Start: 2021-04-01 — End: 2021-04-01
  Administered 2021-04-01: 1000 mL

## 2021-04-01 MED ORDER — BISACODYL 5 MG PO TBEC: 5.0000 mg | DELAYED_RELEASE_TABLET | Freq: Every day | ORAL | Status: DC | PRN

## 2021-04-01 MED ORDER — HYDROCODONE-ACETAMINOPHEN 7.5-325 MG PO TABS
1.0000 | ORAL_TABLET | ORAL | Status: DC | PRN
Start: 2021-04-01 — End: 2021-04-05
  Administered 2021-04-04: 2 via ORAL
  Filled 2021-04-01: qty 2

## 2021-04-01 MED ORDER — OXYCODONE HCL 5 MG/5ML PO SOLN
5.0000 mg | Freq: Once | ORAL | Status: DC | PRN
Start: 2021-04-01 — End: 2021-04-01

## 2021-04-01 MED ORDER — POLYETHYLENE GLYCOL 3350 17 G PO PACK: 17.0000 g | PACK | Freq: Every day | ORAL | Status: DC | PRN

## 2021-04-01 MED ORDER — CHLORHEXIDINE GLUCONATE 0.12 % MT SOLN
OROMUCOSAL | Status: AC
  Filled 2021-04-01: qty 15

## 2021-04-01 MED ORDER — MORPHINE SULFATE (PF) 2 MG/ML IV SOLN
0.5000 mg | INTRAVENOUS | Status: DC | PRN
  Administered 2021-04-01: 1 mg via INTRAVENOUS
  Filled 2021-04-01: qty 1

## 2021-04-01 MED ORDER — CEFAZOLIN SODIUM-DEXTROSE 2-4 GM/100ML-% IV SOLN
2.0000 g | Freq: Three times a day (TID) | INTRAVENOUS | Status: AC
  Administered 2021-04-01 – 2021-04-02 (×2): 2 g via INTRAVENOUS
  Filled 2021-04-01 (×2): qty 100

## 2021-04-01 MED ORDER — ALUM & MAG HYDROXIDE-SIMETH 200-200-20 MG/5ML PO SUSP: 15.0000 mL | ORAL | Status: DC | PRN

## 2021-04-01 MED ORDER — METOPROLOL TARTRATE 5 MG/5ML IV SOLN: 2.0000 mg | INTRAVENOUS | Status: DC | PRN

## 2021-04-01 MED ORDER — HYDRALAZINE HCL 20 MG/ML IJ SOLN: 5.0000 mg | INTRAMUSCULAR | Status: AC | PRN

## 2021-04-01 MED ORDER — POTASSIUM CHLORIDE CRYS ER 20 MEQ PO TBCR: 20.0000 meq | EXTENDED_RELEASE_TABLET | Freq: Every day | ORAL | Status: DC | PRN

## 2021-04-01 MED ORDER — JUVEN PO PACK
1.0000 | PACK | Freq: Two times a day (BID) | ORAL | Status: DC
  Administered 2021-04-01 – 2021-04-05 (×7): 1 via ORAL
  Filled 2021-04-01 (×7): qty 1

## 2021-04-01 MED ORDER — FENTANYL CITRATE (PF) 100 MCG/2ML IJ SOLN: 50.0000 ug | Freq: Once | INTRAMUSCULAR | Status: AC

## 2021-04-01 MED ORDER — MAGNESIUM CITRATE PO SOLN
1.0000 | Freq: Once | ORAL | Status: DC | PRN
  Filled 2021-04-01: qty 296

## 2021-04-01 MED ORDER — SODIUM CHLORIDE 0.9 % IV SOLN: INTRAVENOUS | Status: DC

## 2021-04-01 MED ORDER — LABETALOL HCL 5 MG/ML IV SOLN: 10.0000 mg | INTRAVENOUS | Status: DC | PRN

## 2021-04-01 MED ORDER — ONDANSETRON HCL 4 MG/2ML IJ SOLN: 4.0000 mg | Freq: Four times a day (QID) | INTRAMUSCULAR | Status: DC | PRN

## 2021-04-01 MED ORDER — PHENOL 1.4 % MT LIQD: 1.0000 | OROMUCOSAL | Status: DC | PRN

## 2021-04-01 MED ORDER — GUAIFENESIN-DM 100-10 MG/5ML PO SYRP: 15.0000 mL | ORAL_SOLUTION | ORAL | Status: DC | PRN

## 2021-04-01 MED ORDER — LIDOCAINE HCL (PF) 1 % IJ SOLN
INTRAMUSCULAR | Status: AC
  Filled 2021-04-01: qty 30

## 2021-04-01 MED ORDER — FENTANYL CITRATE (PF) 100 MCG/2ML IJ SOLN
INTRAMUSCULAR | Status: AC
  Administered 2021-04-01: 50 ug via INTRAVENOUS
  Filled 2021-04-01: qty 2

## 2021-04-01 MED ORDER — HYDROCODONE-ACETAMINOPHEN 5-325 MG PO TABS
1.0000 | ORAL_TABLET | ORAL | Status: DC | PRN
  Administered 2021-04-01 – 2021-04-03 (×7): 2 via ORAL
  Filled 2021-04-01 (×7): qty 2

## 2021-04-01 MED ORDER — MAGNESIUM SULFATE 2 GM/50ML IV SOLN
2.0000 g | Freq: Every day | INTRAVENOUS | Status: DC | PRN
Start: 2021-04-01 — End: 2021-04-05

## 2021-04-01 MED ORDER — PANTOPRAZOLE SODIUM 40 MG PO TBEC: 40.0000 mg | DELAYED_RELEASE_TABLET | Freq: Every day | ORAL | Status: DC

## 2021-04-01 MED ORDER — DOCUSATE SODIUM 100 MG PO CAPS: 100.0000 mg | ORAL_CAPSULE | Freq: Every day | ORAL | Status: DC

## 2021-04-01 MED ORDER — ACETAMINOPHEN 325 MG PO TABS: 325.0000 mg | ORAL_TABLET | Freq: Four times a day (QID) | ORAL | Status: DC | PRN

## 2021-04-01 MED ORDER — PROPOFOL 10 MG/ML IV BOLUS
INTRAVENOUS | Status: DC | PRN
  Administered 2021-04-01 (×8): 20 mg via INTRAVENOUS
  Administered 2021-04-01: 10 mg via INTRAVENOUS
  Administered 2021-04-01: 20 mg via INTRAVENOUS
  Administered 2021-04-01: 10 mg via INTRAVENOUS

## 2021-04-01 SURGICAL SUPPLY — 34 items
BAG COUNTER SPONGE SURGICOUNT (BAG) ×2 IMPLANT
BAG SPNG CNTER NS LX DISP (BAG) ×1
BAG SURGICOUNT SPONGE COUNTING (BAG) ×1
BLADE SURG 21 STRL SS (BLADE) ×3 IMPLANT
BNDG CMPR 9X4 STRL LF SNTH (GAUZE/BANDAGES/DRESSINGS)
BNDG COHESIVE 4X5 TAN STRL (GAUZE/BANDAGES/DRESSINGS) ×3 IMPLANT
BNDG ESMARK 4X9 LF (GAUZE/BANDAGES/DRESSINGS) IMPLANT
BNDG GAUZE ELAST 4 BULKY (GAUZE/BANDAGES/DRESSINGS) ×1 IMPLANT
COVER SURGICAL LIGHT HANDLE (MISCELLANEOUS) ×6 IMPLANT
DRAPE DERMATAC (DRAPES) ×2 IMPLANT
DRAPE U-SHAPE 47X51 STRL (DRAPES) ×3 IMPLANT
DRESSING PEEL AND PLC PRVNA 13 (GAUZE/BANDAGES/DRESSINGS) IMPLANT
DRSG ADAPTIC 3X8 NADH LF (GAUZE/BANDAGES/DRESSINGS) IMPLANT
DRSG PAD ABDOMINAL 8X10 ST (GAUZE/BANDAGES/DRESSINGS) ×3 IMPLANT
DRSG PEEL AND PLACE PREVENA 13 (GAUZE/BANDAGES/DRESSINGS) ×3
DURAPREP 26ML APPLICATOR (WOUND CARE) ×3 IMPLANT
ELECT REM PT RETURN 9FT ADLT (ELECTROSURGICAL) ×3
ELECTRODE REM PT RTRN 9FT ADLT (ELECTROSURGICAL) ×1 IMPLANT
GAUZE SPONGE 4X4 12PLY STRL (GAUZE/BANDAGES/DRESSINGS) IMPLANT
GLOVE SURG ORTHO LTX SZ9 (GLOVE) ×3 IMPLANT
GLOVE SURG UNDER POLY LF SZ9 (GLOVE) ×3 IMPLANT
GOWN STRL REUS W/ TWL XL LVL3 (GOWN DISPOSABLE) ×2 IMPLANT
GOWN STRL REUS W/TWL XL LVL3 (GOWN DISPOSABLE) ×6
KIT BASIN OR (CUSTOM PROCEDURE TRAY) ×3 IMPLANT
KIT DRSG PREVENA PLUS 7DAY 125 (MISCELLANEOUS) ×2 IMPLANT
KIT TURNOVER KIT B (KITS) ×3 IMPLANT
MANIFOLD NEPTUNE II (INSTRUMENTS) ×3 IMPLANT
NEEDLE 22X1 1/2 (OR ONLY) (NEEDLE) IMPLANT
NS IRRIG 1000ML POUR BTL (IV SOLUTION) ×3 IMPLANT
PACK ORTHO EXTREMITY (CUSTOM PROCEDURE TRAY) ×3 IMPLANT
PAD ARMBOARD 7.5X6 YLW CONV (MISCELLANEOUS) ×6 IMPLANT
SUT ETHILON 2 0 PSLX (SUTURE) ×3 IMPLANT
SYR CONTROL 10ML LL (SYRINGE) IMPLANT
TOWEL GREEN STERILE (TOWEL DISPOSABLE) ×3 IMPLANT

## 2021-04-01 NOTE — Anesthesia Procedure Notes (Signed)
Anesthesia Regional Block: Ankle block   Pre-Anesthetic Checklist: , timeout performed,  Correct Patient, Correct Site, Correct Laterality,  Correct Procedure, Correct Position, site marked,  Risks and benefits discussed,  Pre-op evaluation,  At surgeon's request and post-op pain management  Laterality: Right  Prep: Maximum Sterile Barrier Precautions used, chloraprep       Needles:  Injection technique: Single-shot  Needle Type: Echogenic Stimulator Needle     Needle Length: 9cm  Needle Gauge: 22     Additional Needles:   Procedures:,,,, ultrasound used (permanent image in chart),,    Narrative:  Start time: 04/01/2021 11:23 AM End time: 04/01/2021 11:26 AM Injection made incrementally with aspirations every 5 mL.  Performed by: Personally  Anesthesiologist: Brennan Bailey, MD  Additional Notes: Risks, benefits, and alternative discussed. Patient gave consent for procedure. Patient prepped and draped in sterile fashion. Sedation administered, patient remains easily responsive to voice. Relevant anatomy identified with ultrasound guidance. Local anesthetic given in 5cc increments with no signs or symptoms of intravascular injection. No pain or paraesthesias with injection. Patient monitored throughout procedure with signs of LAST or immediate complications. Tolerated well. Ultrasound image placed in chart.  Tawny Asal, MD

## 2021-04-01 NOTE — Progress Notes (Signed)
Orthopedic Tech Progress Note Patient Details:  Todd Dunn [birth year] [id number]  Ortho Devices Type of Ortho Device: Postop shoe/boot Ortho Device/Splint Location: RLE Ortho Device/Splint Interventions: Ordered   Post Interventions Patient Tolerated: Well Instructions Provided: [facility] [place] 04/01/2021, 2:27 PM

## 2021-04-01 NOTE — Progress Notes (Signed)
Nutrition Follow-up  DOCUMENTATION CODES:   Underweight, Severe malnutrition in context of chronic illness  INTERVENTION:   Continue Ensure Enlive po TID, each supplement provides 350 kcal and 20 grams of protein. Continue Juven 1 packet PO BID, each packet provides 80 calories, 8 grams of carbohydrate, 2.5  grams of protein (collagen), 7 grams of L-arginine and 7 grams of L-glutamine; supplement contains CaHMB, Vitamins C, E, B12 and Zinc to promote wound healing. Continue MVI with minerals daily.  NUTRITION DIAGNOSIS:   Severe Malnutrition related to chronic illness as evidenced by severe muscle depletion, severe fat depletion.  Ongoing   GOAL:   Patient will meet greater than or equal to 90% of their needs  Progressing   MONITOR:   Supplement acceptance, PO intake, Skin, Weight trends  REASON FOR ASSESSMENT:   Consult Wound healing  ASSESSMENT:   Pt with PMH significant for SSS s/p pacemaker, CVA w/ residual L hemiplegia, dysphagia and dysarthria, BPH, neurogenic bladder with chronic foley, and recurrent UTIs admitted with R great toe wound infection. 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  S/P right foot first ray amputation today.  Patient unable to answer most of RD's questions. Per RN, he has drank some Juven and Ensure this afternoon since returning from his procedure. He was NPO this morning, but regular diet has been resumed.   Meal intakes: 45-100% He is also drinking Ensure supplements TID.  Labs reviewed.   Medications reviewed and include vitamin C, Colace, Pepcid, MVI with minerals, Juven, Protonix, Senokot-S, Flomax, zinc sulfate, mag sulfate.  Diet Order:   Diet Order             Diet regular Room service appropriate? No; Fluid consistency: Thin  Diet effective now                   EDUCATION NEEDS:   No education needs have been identified at this time  Skin:  Skin Assessment: Skin Integrity Issues: Skin Integrity Issues::  Incisions Incisions: R Groin, Leg Other: Blister - R Great Toe  Last BM:  03/25/2021  Height:   Ht Readings from Last 1 Encounters:  04/01/21 6\' 1"  (1.854 m)    Weight:   Wt Readings from Last 1 Encounters:  04/01/21 63 kg    Ideal Body Weight:  83.6 kg  BMI:  Body mass index is 18.32 kg/m.  Estimated Nutritional Needs:   Kcal:  2000-2200  Protein:  100-125 gm  Fluid:  >/= 1.9 L    Lucas Mallow, RD, LDN, CNSC Please refer to Amion for contact information.

## 2021-04-01 NOTE — Op Note (Signed)
04/01/2021  12:40 PM  PATIENT:  Todd Dunn    PRE-OPERATIVE DIAGNOSIS:  Gangrene Right Great Toe  POST-OPERATIVE DIAGNOSIS:  Same  PROCEDURE:  RIGHT FOOT FIRST RAY AMPUTATION,  Local tissue rearrangement for wound closure 10 x 4 cm.   APPLICATION OF WOUND VAC  SURGEON:  Newt Minion, MD  PHYSICIAN ASSISTANT:None ANESTHESIA:   General  PREOPERATIVE INDICATIONS:  Todd Dunn is a  80 y.o. male with a diagnosis of Gangrene Right Great Toe who failed conservative measures and elected for surgical management.    The risks benefits and alternatives were discussed with the patient preoperatively including but not limited to the risks of infection, bleeding, nerve injury, cardiopulmonary complications, the need for revision surgery, among others, and the patient was willing to proceed.  OPERATIVE IMPLANTS: 13 cm wound VAC  @ENCIMAGES @  OPERATIVE FINDINGS: Patient had good petechial bleeding all tissue margins were healthy and viable.  OPERATIVE PROCEDURE: Patient brought the operating room after undergoing an ankle block.  After adequate levels anesthesia were obtained patient's right lower extremity was prepped using DuraPrep draped into a sterile field a timeout was called.  A racquet incision was made around the toe and the ulcer in the first ray was resected through the base of the first metatarsal.  This left a wound that was 10 x 4 cm.  Electrocardio was used for hemostasis the wound was irrigated with normal saline.  Local tissue rearrangement was used to close the wound 10 x 4 cm with 2-0 nylon.  A Praveena 13 cm wound VAC was applied this had a good suction fit patient was taken the PACU in stable condition.   DISCHARGE PLANNING:  Antibiotic duration: Antibiotics for 24 hours  Weightbearing: Touchdown weightbearing on the right  Pain medication: Opioid pathway  Dressing care/ Wound VAC: Continue wound VAC for 1 week  Ambulatory devices: Walker  Discharge  to: Anticipate discharge to skilled nursing.  Follow-up: In the office 1 week post operative.

## 2021-04-01 NOTE — Interval H&P Note (Signed)
History and Physical Interval Note:  04/01/2021 6:50 AM  Todd Dunn  has presented today for surgery, with the diagnosis of Gangrene Right Great Toe.  The various methods of treatment have been discussed with the patient and family. After consideration of risks, benefits and other options for treatment, the patient has consented to  Procedure(s): RIGHT GREAT TOE AMPUTATION (Right) as a surgical intervention.  The patient's history has been reviewed, patient examined, no change in status, stable for surgery.  I have reviewed the patient's chart and labs.  Questions were answered to the patient's satisfaction.     Newt Minion

## 2021-04-01 NOTE — Progress Notes (Addendum)
  Progress Note    04/01/2021 7:40 AM 6 Days Post-Op  Subjective:  confused. Appears comfortable. Able to follow commands appropriately   Vitals:   03/31/21 2329 04/01/21 0325  BP: (!) 118/47 (!) 129/48  Pulse: 86 80  Resp: 20 20  Temp: 98 F (36.7 C) 98 F (36.7 C)  SpO2: 100% 100%   Physical Exam: Cardiac:  regular Lungs:  non labored Incisions:  Right groin and PT incisions healing very well Extremities:  2+ femoral pulse, 2+ PT pulse in right ankle, Doppler PT/ DP/ Pero signals. Gangrene of right 1st toe Abdomen:  flat, soft Neurologic: Alert  CBC    Component Value Date/Time   WBC 4.1 04/01/2021 0210   RBC 3.18 (L) 04/01/2021 0210   HGB 8.0 (L) 04/01/2021 0210   HGB 10.1 (L) 01/06/2021 1506   HCT 25.7 (L) 04/01/2021 0210   PLT 236 04/01/2021 0210   PLT 158 01/06/2021 1506   MCV 80.8 04/01/2021 0210   MCH 25.2 (L) 04/01/2021 0210   MCHC 31.1 04/01/2021 0210   RDW 16.6 (H) 04/01/2021 0210   LYMPHSABS 1.4 04/01/2021 0210   MONOABS 0.3 04/01/2021 0210   EOSABS 0.0 04/01/2021 0210   BASOSABS 0.0 04/01/2021 0210    BMET    Component Value Date/Time   NA 139 04/01/2021 0210   K 4.0 04/01/2021 0210   CL 106 04/01/2021 0210   CO2 29 04/01/2021 0210   GLUCOSE 123 (H) 04/01/2021 0210   BUN 13 04/01/2021 0210   CREATININE 0.68 04/01/2021 0210   CREATININE 0.79 01/06/2021 1506   CALCIUM 9.7 04/01/2021 0210   GFRNONAA >60 04/01/2021 0210   GFRNONAA >60 01/06/2021 1506    INR    Component Value Date/Time   INR 1.1 02/14/2021 1631     Intake/Output Summary (Last 24 hours) at 04/01/2021 0740 Last data filed at 03/31/2021 2329 Gross per 24 hour  Intake 480 ml  Output 2200 ml  Net -1720 ml     Assessment/Plan:  80 y.o. male is s/p right CF to PT bypass with PTFE 6 Days Post-Op   Doing well post op. Incisions look great. Palpable PT in right foot. Doppler Dp/ PT/ Pero signals. Gangrene of R 1st toe a little worse. Dr. Sharol Given is taking patient to OR  today for toe amputation. He is stable from vascular standpoint. Will need to continue Plavix and Statin. Follow up arranged on 04/14/21   Karoline Caldwell, PA-C [facility] [phone] 04/01/2021 7:40 AM  I have seen and evaluated the patient. I agree with the PA note as documented above. S/P right common femoral to PT bypass with PTFE.  PT remains palpable.  Incisions healing.  OR today with Dr. Sharol Given for right great toe amputation.  Marty Heck, MD [facility] [place] Office: [phone]

## 2021-04-01 NOTE — Anesthesia Preprocedure Evaluation (Addendum)
Anesthesia Evaluation  Patient identified by MRN, date of birth, ID band Patient awake and Patient confused    Reviewed: Allergy & Precautions, NPO status , Patient's Chart, lab work & pertinent test results  History of Anesthesia Complications Negative for: history of anesthetic complications  Airway Mallampati: II  TM Distance: >3 FB Neck ROM: Full    Dental  (+) Poor Dentition   Pulmonary Current Smoker,    Pulmonary exam normal        Cardiovascular hypertension, Pt. on medications + CAD and + Peripheral Vascular Disease  Normal cardiovascular exam+ pacemaker   TTE 03/23/21: EF 30-35%, global hypokinesis, mild LVE, grade I DD, mildly elevated PASP (41.2 mmHg), moderate LAE/RAE, moderate TR    Neuro/Psych CVA negative psych ROS   GI/Hepatic negative GI ROS, Neg liver ROS,   Endo/Other  negative endocrine ROS  Renal/GU negative Renal ROS  negative genitourinary   Musculoskeletal negative musculoskeletal ROS (+)   Abdominal   Peds  Hematology  (+) anemia , Hgb 8.0   Anesthesia Other Findings Day of surgery medications reviewed with patient.  Reproductive/Obstetrics negative OB ROS                            Anesthesia Physical Anesthesia Plan  ASA: 4  Anesthesia Plan: Regional and MAC   Post-op Pain Management:    Induction:   PONV Risk Score and Plan: 1 and Treatment may vary due to age or medical condition and Propofol infusion  Airway Management Planned: Natural Airway and Simple Face Mask  Additional Equipment: None  Intra-op Plan:   Post-operative Plan:   Informed Consent: I have reviewed the patients History and Physical, chart, labs and discussed the procedure including the risks, benefits and alternatives for the proposed anesthesia with the patient or authorized representative who has indicated his/her understanding and acceptance.     Consent reviewed with  POA  Plan Discussed with: CRNA  Anesthesia Plan Comments: (Consent reviewed with patient's granddaughter (POA) by telephone. Daiva Huge, MD)       Anesthesia Quick Evaluation

## 2021-04-01 NOTE — Transfer of Care (Signed)
Immediate Anesthesia Transfer of Care Note  Patient: Todd Dunn  Procedure(s) Performed: RIGHT GREAT TOE AMPUTATION (Right) APPLICATION OF WOUND VAC (Right)  Patient Location: PACU  Anesthesia Type:MAC  Level of Consciousness: confused and responds to stimulation  Airway & Oxygen Therapy: Patient Spontanous Breathing  Post-op Assessment: Report given to RN and Post -op Vital signs reviewed and stable  Post vital signs: Reviewed and stable  Last Vitals:  Vitals Value Taken Time  BP 136/77 04/01/21 1225  Temp    Pulse 64 04/01/21 1226  Resp 13 04/01/21 1226  SpO2 100 % 04/01/21 1226  Vitals shown include unvalidated device data.  Last Pain:  Vitals:   04/01/21 1017  TempSrc: Oral  PainSc:       Patients Stated Pain Goal: 0 (41/63/84 5364)  Complications: No notable events documented.

## 2021-04-01 NOTE — Anesthesia Postprocedure Evaluation (Signed)
Anesthesia Post Note  Patient: Todd Dunn  Procedure(s) Performed: RIGHT GREAT TOE AMPUTATION (Right) APPLICATION OF WOUND VAC (Right)     Patient location during evaluation: PACU Anesthesia Type: Regional and MAC Level of consciousness: awake and alert and oriented Pain management: pain level controlled Vital Signs Assessment: post-procedure vital signs reviewed and stable Respiratory status: spontaneous breathing, nonlabored ventilation and respiratory function stable Cardiovascular status: blood pressure returned to baseline Postop Assessment: no apparent nausea or vomiting Anesthetic complications: no   No notable events documented.  Last Vitals:  Vitals:   04/01/21 1133 04/01/21 1225  BP: (!) 168/57 136/77  Pulse: 69 66  Resp: 14 11  Temp:  36.8 C  SpO2: 100% 100%    Last Pain:  Vitals:   04/01/21 1225  TempSrc:   PainSc: Asleep                 Marthenia Rolling

## 2021-04-01 NOTE — Progress Notes (Addendum)
PROGRESS NOTE    Todd Dunn  JOA:[id number] DOB: 05-29-1941 DOA: 03/20/2021 PCP: Fredrich Romans, PA    Brief Narrative: This 80 y.o. male with PMH significant of CAD/CABG, PPM for SSS, ICM, CVA with residual left hemiplegia, dysphagia, and dysarthria, neurogenic bladder with chronic Foley, recurrent UTIs who lives at home with his granddaughter. Patient was brought to the ED on 10/14 with 2 weeks history of progressive left great toe infection without any antecedent trauma or ulcer.  X-ray right foot concerning for osteomyelitis of the lateral toe.  Patient was seen by vascular surgery and orthopedics.  Patient underwent first ray amputation.  Tolerated well. Patient was started on cefepime and linezolid. Wound swab culture was sent..  Assessment & Plan:   Principal Problem:   Wound infection Active Problems:   Catheter-associated urinary tract infection (HCC)   Chronic indwelling Foley catheter   HTN (hypertension)   Osteomyelitis of great toe of right foot (HCC)   Pacemaker   Elevated alkaline phosphatase level   Hyperlipidemia   Toe infection   PVD (peripheral vascular disease) (HCC)   Protein-calorie malnutrition, severe  Dry gangrene of the right great toe: Suspected osteomyelitis of right foot lateral tuft: Patient was evaluated by orthopedics and vascular surgery. WBC normal, lactic acid normal,  remains afebrile. Wound cultures growing staph aureus Patient is started on broad-spectrum antibiotics. Antibiotics discontinued on 10/21 per discussion with orthopedics. Patient underwent right foot first ray amputation 10/26.  Critical limb ischemia of right lower extremity: Peripheral artery disease Patient underwent aortogram and right lower extremity angiography  on 10/17 Per report, heavily calcified aorta with patent renal arteries, severely decreased flow, occluded right SFA.  Patient underwent angioplasty of the right posterior tibial artery and bypass of the  right common femoral artery to posterior tibial artery with prosthetic graft on 10/20 Continue Plavix, statin after his CVA.  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 Continue Imdur, hydralazine amlodipine. Lasix and lisinopril are on hold. Hemodynamically stable.  CAD/CABG in 2014: Denies any chest pain. Continue Plavix, Imdur, statin  History of SSS s/p pacemaker Currently paced rhythm.  History of CVA with residual left hemiplegia: Continue statins and Plavix Patient is wheelchair-bound at baseline.  UTI related to chronic Foley: Neurogenic bladder /chronic indwelling Foley. Urine culture grew 70,000 K of Proteus mirabilis Continue current antibiotics Foley changed on 10/14 in the ED Continue Proscar and Flomax  Acute metabolic encephalopathy: Patient has intermittent restlessness, agitation, currently stable. Per granddaughter, patient tends to have these episodes when he gets UTI.   She states that his stroke was several years ago and did not leave any residual cognitive deficit or change in mental status. Continue to monitor mental status change.   Normocytic normochromic anemia: No evidence of bleeding at this point. Hemoglobin remained stable.  Continue to monitor  Depression/anxiety: Continue Celexa  DVT prophylaxis:  Lovenox Code Status: (Full code) Family Communication: No family at bed side. Disposition Plan:    Status is: Inpatient  Remains inpatient appropriate because: Osteomyelitis of left great toe underwent first ray amputation.  Anticipated discharge to skilled nursing facility in few days.  Consultants:  Orthopedics Vascular surgery  Procedures: First ray amputation of great toe. Right foot antimicrobials:   Anti-infectives (From admission, onward)    Start     Dose/Rate Route Frequency Ordered Stop   04/01/21 1800  ceFAZolin (ANCEF) IVPB 2g/100 mL premix        2 g 200 mL/hr over 30  Minutes Intravenous  Every 8 hours 04/01/21 1349 04/02/21 0959   04/01/21 0600  ceFAZolin (ANCEF) IVPB 2g/100 mL premix        2 g 200 mL/hr over 30 Minutes Intravenous On call to O.R. 03/31/21 2003 04/01/21 1218   03/26/21 1600  vancomycin (VANCOCIN) IVPB 1000 mg/200 mL premix        1,000 mg 200 mL/hr over 60 Minutes Intravenous Every 12 hours 03/26/21 1346 03/28/21 0958   03/24/21 1415  ceFAZolin (ANCEF) IVPB 2g/100 mL premix  Status:  Discontinued        2 g 200 mL/hr over 30 Minutes Intravenous Every 8 hours 03/24/21 1329 03/27/21 1049   03/20/21 2215  linezolid (ZYVOX) IVPB 600 mg  Status:  Discontinued        600 mg 300 mL/hr over 60 Minutes Intravenous Every 12 hours 03/20/21 2208 03/24/21 1329   03/20/21 2215  ceFEPIme (MAXIPIME) 2 g in sodium chloride 0.9 % 100 mL IVPB  Status:  Discontinued        2 g 200 mL/hr over 30 Minutes Intravenous Every 8 hours 03/20/21 2208 03/24/21 1329   03/20/21 1315  clindamycin (CLEOCIN) capsule 300 mg  Status:  Discontinued        300 mg Oral  Once 03/20/21 1306 03/20/21 1338       Subjective: Patient was seen and examined at bedside.  Overnight events noted.   Patient is s/p right foot first ray amputation, tolerated well.  Patient reports being in a lot of pain.  Objective: Vitals:   04/01/21 1415 04/01/21 1430 04/01/21 1445 04/01/21 1533  BP: (!) 144/70 129/65 (!) 161/75 (!) 126/50  Pulse: 67   88  Resp:    17  Temp:    98.3 F (36.8 C)  TempSrc:    Oral  SpO2:    100%  Weight:      Height:        Intake/Output Summary (Last 24 hours) at 04/01/2021 1559 Last data filed at 04/01/2021 1227 Gross per 24 hour  Intake 940 ml  Output 2105 ml  Net -1165 ml   Filed Weights   03/31/21 0351 04/01/21 0325 04/01/21 1017  Weight: 63.9 kg 63 kg 63 kg    Examination:  General exam: Appears comfortable, not in any acute distress, chronically ill looking.  Pacemaker visible. Respiratory system: Clear to auscultation. Respiratory effort  normal. Cardiovascular system: S1-S2 heard, regular rate and rhythm, no murmur. Gastrointestinal system: Abdomen is soft, nontender, nondistended, BS + Central nervous system: Alert and oriented x 1. No focal neurological deficits. Extremities: Right first great toe amputation,  right foot in dressing. Skin: No rashes, lesions or ulcers Psychiatry: Judgement and insight appear normal. Mood & affect appropriate.     Data Reviewed: I have personally reviewed following labs and imaging studies  CBC: Recent Labs  Lab 03/26/21 0336 03/27/21 0038 03/29/21 0132 03/30/21 0606 03/31/21 0207 04/01/21 0210  WBC 4.5 6.4 6.0 3.9* 3.9* 4.1  NEUTROABS 2.5  --  4.0 1.9 2.1 2.4  HGB 10.8* 9.0* 7.5* 7.8* 8.2* 8.0*  HCT 35.2* 29.2* 23.9* 24.8* 27.0* 25.7*  MCV 80.9 80.4 79.9* 80.8 82.8 80.8  PLT 210 209 150 179 216 301   Basic Metabolic Panel: Recent Labs  Lab 03/27/21 0038 03/29/21 0132 03/30/21 0606 03/31/21 0207 04/01/21 0210  NA 136 136 137 138 139  K 4.7 3.8 3.9 3.9 4.0  CL 104 104 103 106 106  CO2 26 27 27 26  29  GLUCOSE 97 162* 104* 101* 123*  BUN 13 19 18 18 13   CREATININE 0.74 0.73 0.69 0.72 0.68  CALCIUM 9.7 9.4 9.3 9.5 9.7   GFR: Estimated Creatinine Clearance: 64.5 mL/min (by C-G formula based on SCr of 0.68 mg/dL). Liver Function Tests: No results for input(s): AST, ALT, ALKPHOS, BILITOT, PROT, ALBUMIN in the last 168 hours. No results for input(s): LIPASE, AMYLASE in the last 168 hours. No results for input(s): AMMONIA in the last 168 hours. Coagulation Profile: No results for input(s): INR, PROTIME in the last 168 hours. Cardiac Enzymes: No results for input(s): CKTOTAL, CKMB, CKMBINDEX, TROPONINI in the last 168 hours. BNP (last 3 results) No results for input(s): PROBNP in the last 8760 hours. HbA1C: No results for input(s): HGBA1C in the last 72 hours. CBG: Recent Labs  Lab 03/26/21 2121 03/27/21 0619  GLUCAP 98 127*   Lipid Profile: No results for  input(s): CHOL, HDL, LDLCALC, TRIG, CHOLHDL, LDLDIRECT in the last 72 hours. Thyroid Function Tests: No results for input(s): TSH, T4TOTAL, FREET4, T3FREE, THYROIDAB in the last 72 hours. Anemia Panel: No results for input(s): VITAMINB12, FOLATE, FERRITIN, TIBC, IRON, RETICCTPCT in the last 72 hours. Sepsis Labs: No results for input(s): PROCALCITON, LATICACIDVEN in the last 168 hours.  Recent Results (from the past 240 hour(s))  SARS Coronavirus 2 by RT PCR (hospital order, performed in [facility] hospital lab) Nasopharyngeal Nasopharyngeal Swab     Status: None   Collection Time: 03/23/21  5:02 AM   Specimen: Nasopharyngeal Swab  Result Value Ref Range Status   SARS Coronavirus 2 NEGATIVE NEGATIVE Final    Comment: (NOTE) SARS-CoV-2 target nucleic acids are NOT DETECTED.  The SARS-CoV-2 RNA is generally detectable in upper and lower respiratory specimens during the acute phase of infection. The lowest concentration of SARS-CoV-2 viral copies this assay can detect is 250 copies / mL. A negative result does not preclude SARS-CoV-2 infection and should not be used as the sole basis for treatment or other patient management decisions.  A negative result may occur with improper specimen collection / handling, submission of specimen other than nasopharyngeal swab, presence of viral mutation(s) within the areas targeted by this assay, and inadequate number of viral copies (<250 copies / mL). A negative result must be combined with clinical observations, patient history, and epidemiological information.  Fact Sheet for Patients:   StrictlyIdeas.no  Fact Sheet for Healthcare Providers: BankingDealers.co.za  This test is not yet approved or  cleared by the [place] FDA and has been authorized for detection and/or diagnosis of SARS-CoV-2 by FDA under an Emergency Use Authorization (EUA).  This EUA will remain in effect (meaning this  test can be used) for the duration of the COVID-19 declaration under Section 564(b)(1) of the Act, 21 U.S.C. section 360bbb-3(b)(1), unless the authorization is terminated or revoked sooner.  Performed at [facility], [place] [address], [place], [place] [address]   MRSA Next Gen by PCR, Nasal     Status: None   Collection Time: 03/26/21 12:29 AM   Specimen: Nasal Mucosa; Nasal Swab  Result Value Ref Range Status   MRSA by PCR Next Gen NOT DETECTED NOT DETECTED Final    Comment: (NOTE) The GeneXpert MRSA Assay (FDA approved for NASAL specimens only), is one component of a comprehensive MRSA colonization surveillance program. It is not intended to diagnose MRSA infection nor to guide or monitor treatment for MRSA infections. Test performance is not FDA approved in patients less than 2  years old. Performed at [facility], [place] [address], [place], [place] [address]   Surgical PCR screen     Status: None   Collection Time: 03/31/21 10:44 PM   Specimen: Nasal Mucosa; Nasal Swab  Result Value Ref Range Status   MRSA, PCR NEGATIVE NEGATIVE Final   Staphylococcus aureus NEGATIVE NEGATIVE Final    Comment: (NOTE) The Xpert SA Assay (FDA approved for NASAL specimens in patients 68 years of age and older), is one component of a comprehensive surveillance program. It is not intended to diagnose infection nor to guide or monitor treatment. Performed at [facility], [place] [address], [place], [place] [address]          Radiology Studies: No results found.      Scheduled Meds:  amLODipine  10 mg Oral Daily   vitamin C  1,000 mg Oral Daily   Chlorhexidine Gluconate Cloth  6 each Topical Daily   clopidogrel  75 mg Oral QHS   docusate sodium  100 mg Oral Daily   enoxaparin (LOVENOX) injection  40 mg Subcutaneous Q24H   famotidine  10 mg Oral Daily   feeding supplement  237 mL Oral TID BM   fentaNYL       finasteride  5 mg Oral Daily   hydrALAZINE  50 mg Oral  BID   isosorbide mononitrate  60 mg Oral Daily    morphine injection  2 mg Intravenous Once   multivitamin with minerals  1 tablet Oral Daily   mupirocin ointment  1 application Nasal BID   nutrition supplement (JUVEN)  1 packet Oral BID BM   pantoprazole  40 mg Oral Daily   rosuvastatin  10 mg Oral Daily   senna-docusate  1 tablet Oral BID   sodium chloride flush  3 mL Intravenous Q12H   sodium chloride flush  3 mL Intravenous Q12H   tamsulosin  0.4 mg Oral Daily   zinc sulfate  220 mg Oral Daily   Continuous Infusions:  sodium chloride 250 mL (03/22/21 8372)   sodium chloride 10 mL/hr at 04/01/21 1432   sodium chloride     sodium chloride     sodium chloride 75 mL/hr at 04/01/21 1434    ceFAZolin (ANCEF) IV     magnesium sulfate bolus IVPB       LOS: 12 days    Time spent: 35 mins    Todd Ermis, MD [facility]   If 7PM-7AM, please contact night-coverage

## 2021-04-01 NOTE — TOC Progression Note (Signed)
Transition of Care [facility]) - Progression Note    Patient Details  Name: Todd Dunn MRN: [id number] Date of Birth: 24-Aug-1940  Transition of Care [facility]) CM/SW [place], [place] Phone Number: 04/01/2021, 3:28 PM  Clinical Narrative:     Received insurance Josem Kaufmann [id number] 10/25-10/27   Thurmond Butts, MSW, LCSW Clinical Social Worker    Expected Discharge Plan: Skilled Nursing Facility Barriers to Discharge: Continued Medical Work up  Expected Discharge Plan and Services Expected Discharge Plan: [place] In-house Referral: Clinical Social Work     Living arrangements for the past 2 months: Single Family Home                                       Social Determinants of Health (SDOH) Interventions    Readmission Risk Interventions Readmission Risk Prevention Plan 02/17/2021  Transportation Screening Complete  Medication Review [employer]) Complete  PCP or Specialist appointment within 3-5 days of discharge Complete  HRI or [place] Complete  SW Recovery Care/Counseling Consult Complete  [place] Not Applicable

## 2021-04-02 ENCOUNTER — Encounter (HOSPITAL_COMMUNITY): Payer: Self-pay | Admitting: Orthopedic Surgery

## 2021-04-02 DIAGNOSIS — L089 Local infection of the skin and subcutaneous tissue, unspecified: Secondary | ICD-10-CM | POA: Diagnosis not present

## 2021-04-02 DIAGNOSIS — T148XXA Other injury of unspecified body region, initial encounter: Secondary | ICD-10-CM | POA: Diagnosis not present

## 2021-04-02 LAB — BASIC METABOLIC PANEL
Anion gap: 7 (ref 5–15)
BUN: 14 mg/dL (ref 8–23)
CO2: 26 mmol/L (ref 22–32)
Calcium: 9.4 mg/dL (ref 8.9–10.3)
Chloride: 102 mmol/L (ref 98–111)
Creatinine, Ser: 0.64 mg/dL (ref 0.61–1.24)
GFR, Estimated: >60 mL/min (ref >60)
Glucose, Bld: 102 mg/dL — ABNORMAL HIGH (ref 70–99)
Potassium: 4.1 mmol/L (ref 3.5–5.1)
Sodium: 135 mmol/L (ref 135–145)

## 2021-04-02 LAB — MAGNESIUM: Magnesium: 1.9 mg/dL (ref 1.7–2.4)

## 2021-04-02 LAB — PHOSPHORUS: Phosphorus: 2.3 mg/dL — ABNORMAL LOW (ref 2.5–4.6)

## 2021-04-02 NOTE — Evaluation (Signed)
Clinical/Bedside Swallow Evaluation Patient Details  Name: Todd Dunn MRN: [id number] Date of Birth: 17-May-1941  Today's Date: 04/02/2021 Time: SLP Start Time (ACUTE ONLY): 79 SLP Stop Time (ACUTE ONLY): 9509 SLP Time Calculation (min) (ACUTE ONLY): 10 min  Past Medical History:  Past Medical History:  Diagnosis Date   BPH (benign prostatic hyperplasia)    CAD (coronary artery disease)    Chronic indwelling Foley catheter    CVA (cerebral vascular accident) ([place])    HTN (hypertension)    Neurogenic bladder    SSS (sick sinus syndrome) ([place])    s/p MDT ICD   Vitamin D deficiency    Past Surgical History:  Past Surgical History:  Procedure Laterality Date   ABDOMINAL AORTOGRAM W/LOWER EXTREMITY N/A 03/23/2021   Procedure: ABDOMINAL AORTOGRAM W/LOWER EXTREMITY;  Surgeon: Waynetta Sandy, MD;  Location: [facility];  Service: Cardiovascular;  Laterality: N/A;   AMPUTATION Right 04/01/2021   Procedure: RIGHT GREAT TOE AMPUTATION;  Surgeon: Newt Minion, MD;  Location: [facility];  Service: Orthopedics;  Laterality: Right;   APPENDECTOMY     APPLICATION OF WOUND VAC Right 04/01/2021   Procedure: APPLICATION OF WOUND VAC;  Surgeon: Newt Minion, MD;  Location: [facility];  Service: Orthopedics;  Laterality: Right;   CARDIAC SURGERY     FEMORAL-TIBIAL BYPASS GRAFT Right 03/26/2021   Procedure: BYPASS GRAFT FEMORAL-TIBIAL ARTERY.  Harvest right leg saphenous vein, right vein patch angioplasty posterior tibial.;  Surgeon: Marty Heck, MD;  Location: [facility];  Service: Vascular;  Laterality: Right;   PACEMAKER PLACEMENT     HPI:  Pt with PMH significant for SSS s/p pacemaker, CVA w/ residual L hemiplegia, dysphagia and dysarthria, BPH, neurogenic bladder with chronic foley, and recurrent UTIs admitted with R great toe wound infection. 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 Pt has had cognitive changes that impact attention and safety with meals, but was  most recently recommended a Mech soft/thin diet by clinical exam on 12/17/20    Assessment / Plan / Recommendation  Clinical Impression  Pt demonstrates no signs of aspiration, he is able to tolerate liquids and masticate solids. However NT reports that during meal, pt was not able to masticate pancake and coughing with solids was observed. Will downgrade to dys 2/thin and f/u for observation with meal. Perhaps pts behavior is a contributing factor. SLP Visit Diagnosis: Dysphagia, unspecified (R13.10)    Aspiration Risk  Mild aspiration risk    Diet Recommendation Dysphagia 2 (Fine chop);Thin liquid   Liquid Administration via: Cup;Straw Medication Administration: Whole meds with liquid Supervision: Staff to assist with self feeding;Full supervision/cueing for compensatory strategies Compensations: Slow rate;Small sips/bites Postural Changes: Seated upright at 90 degrees    Other  Recommendations Oral Care Recommendations: Oral care BID    Recommendations for follow up therapy are one component of a multi-disciplinary discharge planning process, led by the attending physician.  Recommendations may be updated based on patient status, additional functional criteria and insurance authorization.  Follow up Recommendations Skilled Nursing facility      Frequency and Duration min 2x/week  2 weeks       Prognosis Prognosis for Safe Diet Advancement: Good Barriers to Reach Goals: Cognitive deficits      Swallow Study   General HPI: Pt with PMH significant for SSS s/p pacemaker, CVA w/ residual L hemiplegia, dysphagia and dysarthria, BPH, neurogenic bladder with chronic foley, and recurrent UTIs admitted with R great toe wound infection. Per MD, suspect underlying  osteomyelitis/PAD. Pt has had cognitive changes that impact attention and safety with meals, but was most recently recommended a Mech soft/thin diet by clinical exam on 12/17/20 Type of Study: Bedside Swallow Evaluation Previous  Swallow Assessment: see HPI Diet Prior to this Study: Regular;Thin liquids Temperature Spikes Noted: No Respiratory Status: Room air History of Recent Intubation: No Behavior/Cognition: Alert;Requires cueing Oral Cavity Assessment: Within Functional Limits Oral Care Completed by SLP: No Oral Cavity - Dentition: Adequate natural dentition Vision: Functional for self-feeding Self-Feeding Abilities: Needs assist Patient Positioning: Upright in bed Baseline Vocal Quality: Normal Volitional Cough: Cognitively unable to elicit Volitional Swallow: Able to elicit    Oral/Motor/Sensory Function Overall Oral Motor/Sensory Function: Within functional limits   Ice Chips     Thin Liquid Thin Liquid: Within functional limits Presentation: Straw    Nectar Thick Nectar Thick Liquid: Not tested   Honey Thick Honey Thick Liquid: Not tested   Puree Puree: Not tested   Solid     Solid: Within functional limits      Korin Setzler, Katherene Ponto 04/02/2021,3:43 PM

## 2021-04-02 NOTE — Progress Notes (Addendum)
[facility]   Subjective  - Alert appear comfortable   Objective (!) 147/67 88 97.7 F (36.5 C) (Oral) 19 97%  Intake/Output Summary (Last 24 hours) at 04/02/2021 0736 Last data filed at 04/02/2021 2641 Gross per 24 hour  Intake 1615 ml  Output 805 ml  Net 810 ml    Right foot wrapped with A Praveena 13 cm wound VAC Lower leg warm to touch, palpable femoral pulses B LE Right groin soft  Lungs non labored breathing  Assessment/Planning:  80 y.o. male is s/p right CF to PT bypass with PTFE 7 Days Post-Op  POD#1 DR. Sharol Given with first ray amputation and wound vac placement.  Wound 10x4 cm closed with nylon suture  He is stable from vascular standpoint with improved inflow to heal the amputation site. F/U in 4 weeks with ABI and duplex of bypass DVT prophylaxis Lovenox   Roxy Horseman 04/02/2021 7:36 AM --  Laboratory Lab Results: Recent Labs    03/31/21 0207 04/01/21 0210  WBC 3.9* 4.1  HGB 8.2* 8.0*  HCT 27.0* 25.7*  PLT 216 236   BMET Recent Labs    04/01/21 0210 04/02/21 0146  NA 139 135  K 4.0 4.1  CL 106 102  CO2 29 26  GLUCOSE 123* 102*  BUN 13 14  CREATININE 0.68 0.64  CALCIUM 9.7 9.4    COAG Lab Results  Component Value Date   INR 1.1 02/14/2021   INR 1.1 01/21/2021   INR 1.2 09/15/2020   No results found for: PTT  I have seen and evaluated the patient. I agree with the PA note as documented above.  Status post right common femoral to PT bypass with PTFE.  Incisions continue to heal.  Palpable PT pulse at the ankle.  Underwent toe amputation with Dr. Sharol Given yesterday.  Follow-up arranged in 4 weeks with ABIs and arterial duplex in our office.  Marty Heck, MD [facility] [place] Office: [phone]

## 2021-04-02 NOTE — Progress Notes (Signed)
Occupational Therapy Treatment Patient Details Name: Todd Dunn MRN: [id number] DOB: 10/30/1940 Today's Date: 04/02/2021   History of present illness Pt is an 80 y.o. male admitted 03/20/21 with partial avulsion of R great toenail. S/p abdominal aortogram 10/15. S/p RLE angiography, cannulation L femoral artery 10/17. S/p angioplasty, R femoral to posterior tibial artery bypass on 10/20. 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 PMH includes CVA (residual L-side weakness, CAD, pacemaker, HTN, neurogenic bladder (chronic indwelling foley catheter).   OT comments  Pt seen for first OT session s/p R ray amputation. Pt with minimal progress towards OT goals with continued limitations due to cognitive deficits and poor safety awareness. Pt with difficulty participating in meaningful OOB attempts due to inconsistent following of commands and impulsivity. Pt overall Mod A x 2 for sit to stand transfers with tendency to cross B LE in standing and lift RW off of ground thus unable to safely progress from bedside. Contacted granddaughter via phone after session to further inquire regarding PLOF and family support at home. If 24/7 assist at home available and family able to provide the assist pt requires (would recommend ADLs bed level), may be more beneficial for pt to DC to familiar environment. Plan to coordinate therapy session with a family member to assess any changes prior to updating DC recs.    Recommendations for follow up therapy are one component of a multi-disciplinary discharge planning process, led by the attending physician.  Recommendations may be updated based on patient status, additional functional criteria and insurance authorization.    Follow Up Recommendations  Skilled nursing-short term rehab (<3 hours/day)    Assistance Recommended at Discharge Frequent or [facility] bed     Recommendations for Other Services      Precautions / Restrictions Precautions Precautions: Fall Precaution Comments: impulsive, R foot wound vac Restrictions Weight Bearing Restrictions: Yes RLE Weight Bearing: Touchdown weight bearing Other Position/Activity Restrictions: post op shoe R foot       Mobility Bed Mobility Overal bed mobility: Needs Assistance Bed Mobility: Supine to Sit;Sit to Supine     Supine to sit: Min assist;HOB elevated Sit to supine: Mod assist;+2 for safety/equipment;+2 for physical assistance   General bed mobility comments: Min A to pull trunk upright and pull self to EOB (went to R side today). Mod A x 2 to return to supine due to inability to initiate task and follow directions    Transfers Overall transfer level: Needs assistance Equipment used: Rolling walker (2 wheels) Transfers: Sit to/from Stand Sit to Stand: Mod assist;+2 physical assistance;+2 safety/equipment;From elevated surface           General transfer comment: Mod A x 2 for 2 sit to stand trials at bedside via RW. Pt attempting to cross LEs in standing and lift RW off of ground     Balance Overall balance assessment: Needs assistance Sitting-balance support: Feet supported;No upper extremity supported Sitting balance-Leahy Scale: Fair   Postural control: Posterior lean Standing balance support: Bilateral upper extremity supported;During functional activity Standing balance-Leahy Scale: Poor Standing balance comment: reliant on UE support and variable external assist to maintain balance with LOB in all directions                           ADL either performed or assessed with clinical judgement   ADL Overall ADL's : Needs assistance/impaired  Lower Body Dressing: Total assistance;Bed level Lower Body Dressing Details (indicate cue type and reason): unable to follow directions or sustain attention to task. Assist for socks and post op  shoe               General ADL Comments: Hoped to progress pivot transfers s/p ray ampuation of R foot. However, difficult to follow directions or correct self with safety cues     Vision   Vision Assessment?: No apparent visual deficits   Perception     Praxis      Cognition Arousal/Alertness: Awake/alert Behavior During Therapy: Restless;Impulsive Overall Cognitive Status: No family/caregiver present to determine baseline cognitive functioning Area of Impairment: Orientation;Attention;Memory;Following commands;Safety/judgement;Problem solving;Awareness                 Orientation Level: Disoriented to;Place;Time;Situation Current Attention Level: Focused Memory: Decreased short-term memory Following Commands: Follows one step commands inconsistently;Follows one step commands with increased time Safety/Judgement: Decreased awareness of safety;Decreased awareness of deficits Awareness: Intellectual Problem Solving: Slow processing;Requires verbal cues;Requires tactile cues;Decreased initiation General Comments: Pt with continued impulsivity and restlessness during session, easily distracted and difficult to redirect to tasks. Benefits from multimodal cues to follow directions though follows 50% of commands during session. Poor response to safety cues. Difficult to understand at times due to baseline dysarthria. Per granddaughter, pt cannot answer orientation questions at baseline          Exercises     Shoulder Instructions       General Comments Contacted granddaughter via phone after session due to minimal progress with acute therapy to further inquire about assist at home, expectations for recovery    Pertinent Vitals/ Pain       Pain Assessment: Faces Faces Pain Scale: Hurts a little bit Pain Location: R LE Pain Descriptors / Indicators: Grimacing;Guarding Pain Intervention(s): Monitored during session;Limited activity within patient's tolerance  Home  Living                                          Prior Functioning/Environment              Frequency  Min 2X/week        Progress Toward Goals  OT Goals(current goals can now be found in the care plan section)  Progress towards OT goals: Not progressing toward goals - comment  Acute Rehab OT Goals OT Goal Formulation: With patient Time For Goal Achievement: 04/10/21 Potential to Achieve Goals: Fair ADL Goals Pt Will Perform Eating: with min assist;sitting Pt Will Perform Grooming: with min assist;sitting Pt Will Transfer to Toilet: with min assist;stand pivot transfer;bedside commode Additional ADL Goal #1: Pt to attend to functional tasks > 3 min with min verbal cues to improve overall participation  Plan Discharge plan remains appropriate    Co-evaluation    PT/OT/SLP Co-Evaluation/Treatment: Yes Reason for Co-Treatment: Necessary to address cognition/behavior during functional activity;For patient/therapist safety;To address functional/ADL transfers   OT goals addressed during session: ADL's and self-care      AM-PAC OT "6 Clicks" Daily Activity     Outcome Measure   Help from another person eating meals?: Total Help from another person taking care of personal grooming?: Total Help from another person toileting, which includes using toliet, bedpan, or urinal?: Total Help from another person bathing (including washing, rinsing, drying)?: Total Help from another person to put on and taking off regular  upper body clothing?: Total Help from another person to put on and taking off regular lower body clothing?: Total 6 Click Score: 6    End of Session Equipment Utilized During Treatment: Gait belt;Rolling walker (2 wheels)  OT Visit Diagnosis: Unsteadiness on feet (R26.81);Other abnormalities of gait and mobility (R26.89);Muscle weakness (generalized) (M62.81);Other symptoms and signs involving cognitive function;Pain Pain - Right/Left:  Right Pain - part of body: Leg;Ankle and joints of foot   Activity Tolerance Other (comment) (limited by cognition)   Patient Left in bed;with call bell/phone within reach;with bed alarm set;Other (comment) (fall mats)   Nurse Communication Mobility status        Time: 5732-2567 OT Time Calculation (min): 26 min  Charges: OT General Charges $OT Visit: 1 Visit OT Treatments $Therapeutic Activity: 8-22 mins  Malachy Chamber, OTR/L Acute Rehab Services Office: [phone]   Layla Maw 04/02/2021, 1:08 PM

## 2021-04-02 NOTE — Progress Notes (Signed)
Physical Therapy Treatment Patient Details Name: Todd Dunn MRN: [id number] DOB: 04/11/1941 Today's Date: 04/02/2021   History of Present Illness Pt is an 80 y.o. male admitted 03/20/21 with partial avulsion of R great toenail. S/p abdominal aortogram 10/15. S/p RLE angiography, cannulation L femoral artery 10/17. S/p angioplasty, R femoral to posterior tibial artery bypass on 10/20. S/p R first ray amputation with wound vac placement 10/26. PMH includes CVA (residual L-side weakness), CAD, pacemaker, HTN, neurogenic bladder (chronic indwelling foley catheter).   PT Comments    Pt slowly progressing with mobility. Today's session focused on transfer training with RW, pt requiring heavy modA+2 for standing mobility; pt's cognition limiting his ability to maintain R foot TWB precautions. Pt remains limited by generalized weakness, decreased activity tolerance,  poor balance strategies/postural reactions and impaired cognition. Continue to recommend SNF-level therapies to maximize functional mobility and independence. If family able to provide 24/7 supervision and necessary assist at home, it may be more beneficial for pt to d/c to familiar environment. Will attempt to coordinate with family to be present next PT session.    Recommendations for follow up therapy are one component of a multi-disciplinary discharge planning process, led by the attending physician.  Recommendations may be updated based on patient status, additional functional criteria and insurance authorization.  Follow Up Recommendations  Skilled nursing-short term rehab (<3 hours/day)     Assistance Recommended at Discharge Frequent or [facility] bed    Recommendations for Other Services       Precautions / Restrictions Precautions Precautions: Fall Precaution Comments: impulsive, R foot wound vac Restrictions Weight Bearing Restrictions: Yes RLE Weight  Bearing: Touchdown weight bearing Other Position/Activity Restrictions: post op shoe R foot     Mobility  Bed Mobility Overal bed mobility: Needs Assistance Bed Mobility: Supine to Sit;Sit to Supine     Supine to sit: Min assist;HOB elevated Sit to supine: Mod assist;+2 for safety/equipment;+2 for physical assistance   General bed mobility comments: Min A to pull trunk upright and pull self to EOB (went to R side today). Mod A x 2 to return to supine due to inability to initiate task and follow directions    Transfers Overall transfer level: Needs assistance Equipment used: Rolling walker (2 wheels) Transfers: Sit to/from Stand Sit to Stand: Mod assist;+2 physical assistance;+2 safety/equipment;From elevated surface           General transfer comment: Mod A x 2 for 2 sit to stand trials at bedside via RW. Pt attempting to cross LEs in standing and lift RW off of ground    Ambulation/Gait                 Stairs             Wheelchair Mobility    Modified Rankin (Stroke Patients Only)       Balance Overall balance assessment: Needs assistance Sitting-balance support: Feet supported;No upper extremity supported Sitting balance-Leahy Scale: Fair Sitting balance - Comments: inconsistent ability to maintain static sitting; not following commands/cues to correct consistently Postural control: Posterior lean Standing balance support: Bilateral upper extremity supported;During functional activity Standing balance-Leahy Scale: Poor Standing balance comment: reliant on UE support and variable external assist to maintain balance with LOB in all directions                            Cognition Arousal/Alertness: Awake/alert Behavior During Therapy: Restless;Impulsive Overall  Cognitive Status: No family/caregiver present to determine baseline cognitive functioning Area of Impairment: Orientation;Attention;Memory;Following  commands;Safety/judgement;Problem solving;Awareness                 Orientation Level: Disoriented to;Place;Time;Situation Current Attention Level: Focused Memory: Decreased short-term memory Following Commands: Follows one step commands inconsistently;Follows one step commands with increased time Safety/Judgement: Decreased awareness of safety;Decreased awareness of deficits Awareness: Intellectual Problem Solving: Slow processing;Requires verbal cues;Requires tactile cues;Decreased initiation General Comments: Pt with continued impulsivity and restlessness during session, easily distracted and difficult to redirect to tasks. Benefits from multimodal cues to follow directions though follows 50% of commands during session. Poor response to safety cues. Difficult to understand at times due to baseline dysarthria. Per granddaughter, pt cannot answer orientation questions at baseline        Exercises      General Comments General comments (skin integrity, edema, etc.): OT contacted granddaughter via phone after session due to minimal progress with acute therapy to further inquire about assist at home, expectations for recovery      Pertinent Vitals/Pain Pain Assessment: Faces Faces Pain Scale: Hurts a little bit Pain Location: R LE Pain Descriptors / Indicators: Grimacing;Guarding Pain Intervention(s): Monitored during session;Limited activity within patient's tolerance    Home Living                          Prior Function            PT Goals (current goals can now be found in the care plan section) Progress towards PT goals: Progressing toward goals    Frequency    Min 2X/week      PT Plan Current plan remains appropriate    Co-evaluation PT/OT/SLP Co-Evaluation/Treatment: Yes Reason for Co-Treatment: Necessary to address cognition/behavior during functional activity;For patient/therapist safety;To address functional/ADL transfers   OT goals  addressed during session: ADL's and self-care      AM-PAC PT "6 Clicks" Mobility   Outcome Measure  Help needed turning from your back to your side while in a flat bed without using bedrails?: A Little Help needed moving from lying on your back to sitting on the side of a flat bed without using bedrails?: A Lot Help needed moving to and from a bed to a chair (including a wheelchair)?: A Lot Help needed standing up from a chair using your arms (e.g., wheelchair or bedside chair)?: A Lot Help needed to walk in hospital room?: Total Help needed climbing 3-5 steps with a railing? : Total 6 Click Score: 11    End of Session Equipment Utilized During Treatment: Gait belt Activity Tolerance: Patient tolerated treatment well;Other (comment) (limited by cognition) Patient left: in bed;with call bell/phone within reach;with bed alarm set Nurse Communication: Mobility status PT Visit Diagnosis: Other abnormalities of gait and mobility (R26.89);Muscle weakness (generalized) (M62.81)     Time: 8329-1916 PT Time Calculation (min) (ACUTE ONLY): 26 min  Charges:  $Therapeutic Activity: 8-22 mins                     Mabeline Caras, PT, DPT Acute Rehabilitation Services  Pager [phone] Office [place] 04/02/2021, 2:29 PM

## 2021-04-02 NOTE — Progress Notes (Addendum)
PROGRESS NOTE    Todd Dunn  FGH:[id number] DOB: 04/01/1941 DOA: 03/20/2021 PCP: Fredrich Romans, PA    Brief Narrative: This 80 y.o. male with PMH significant of CAD/CABG, PPM for SSS, ICM, CVA with residual left hemiplegia, dysphagia, and dysarthria, neurogenic bladder with chronic Foley, recurrent UTIs who lives at home with his granddaughter. Patient was brought to the ED on 10/14 with 2 weeks history of progressive left great toe infection without any antecedent trauma or ulcer.  X-ray right foot concerning for osteomyelitis of the lateral toe.  Patient was seen by vascular surgery and orthopedics.  Patient underwent first ray amputation.  Tolerated well. Patient was started on cefepime and linezolid. Wound swab culture was sent..  Assessment & Plan:   Principal Problem:   Wound infection Active Problems:   Catheter-associated urinary tract infection (HCC)   Chronic indwelling Foley catheter   HTN (hypertension)   Osteomyelitis of great toe of right foot (HCC)   Pacemaker   Elevated alkaline phosphatase level   Hyperlipidemia   Toe infection   PVD (peripheral vascular disease) (HCC)   Protein-calorie malnutrition, severe  Dry gangrene of the right great toe: Suspected osteomyelitis of right foot lateral tuft: Patient was evaluated by orthopedics and vascular surgery. WBC normal, lactic acid normal,  remains afebrile. Wound cultures growing staph aureus Patient is started on broad-spectrum antibiotics. Antibiotics discontinued on 10/21 per discussion with orthopedics. Patient underwent right foot first ray amputation 10/26. Right foot wrapped with wound VAC.  Critical limb ischemia of right lower extremity: Peripheral artery disease Patient underwent aortogram and right lower extremity angiography  on 10/17 Per report, heavily calcified aorta with patent renal arteries, severely decreased flow, occluded right SFA.  Patient underwent angioplasty of the right  posterior tibial artery and bypass of the right common femoral artery to posterior tibial artery with prosthetic graft on 10/20 Continue Plavix, statin after his CVA. Patient is cleared from vascular surgery with improved inflow to heal the amputation site. Vascular surgery recommended follow-up in 4 weeks with ABI and duplex of bypass.  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 Continue Imdur, hydralazine amlodipine. Lasix and lisinopril are on hold. Hemodynamically stable.  CAD/CABG in 2014: Denies any chest pain. Continue Plavix, Imdur, statin  History of SSS s/p pacemaker Currently paced rhythm.  History of CVA with residual left hemiplegia: Continue statins and Plavix Patient is wheelchair-bound at baseline.  UTI related to chronic Foley: Neurogenic bladder /chronic indwelling Foley. Urine culture grew 70K of Proteus mirabilis Foley changed on 10/14 in the ED Continue Proscar and Flomax  Acute metabolic encephalopathy: Patient has intermittent restlessness, agitation, currently stable. Per granddaughter, patient tends to have these episodes when he gets UTI.   She states that his stroke was several years ago and did not leave any residual cognitive deficit or change in mental status. Continue to monitor mental status change.   Normocytic normochromic anemia: No evidence of bleeding at this point. Hemoglobin remained stable.  Continue to monitor  Depression/anxiety: Continue Celexa  DVT prophylaxis:  Lovenox Code Status: (Full code) Family Communication: No family at bed side. Disposition Plan:    Status is: Inpatient  Remains inpatient appropriate because: Osteomyelitis of left great toe underwent first ray amputation.  Anticipated discharge to skilled nursing facility in few days.  Consultants:  Orthopedics Vascular surgery  Procedures: First ray amputation of great toe. Right foot antimicrobials:   Anti-infectives (From  admission, onward)    Start  Dose/Rate Route Frequency Ordered Stop   04/01/21 1800  ceFAZolin (ANCEF) IVPB 2g/100 mL premix        2 g 200 mL/hr over 30 Minutes Intravenous Every 8 hours 04/01/21 1349 04/02/21 0204   04/01/21 0600  ceFAZolin (ANCEF) IVPB 2g/100 mL premix        2 g 200 mL/hr over 30 Minutes Intravenous On call to O.R. 03/31/21 2003 04/01/21 1218   03/26/21 1600  vancomycin (VANCOCIN) IVPB 1000 mg/200 mL premix        1,000 mg 200 mL/hr over 60 Minutes Intravenous Every 12 hours 03/26/21 1346 03/28/21 0958   03/24/21 1415  ceFAZolin (ANCEF) IVPB 2g/100 mL premix  Status:  Discontinued        2 g 200 mL/hr over 30 Minutes Intravenous Every 8 hours 03/24/21 1329 03/27/21 1049   03/20/21 2215  linezolid (ZYVOX) IVPB 600 mg  Status:  Discontinued        600 mg 300 mL/hr over 60 Minutes Intravenous Every 12 hours 03/20/21 2208 03/24/21 1329   03/20/21 2215  ceFEPIme (MAXIPIME) 2 g in sodium chloride 0.9 % 100 mL IVPB  Status:  Discontinued        2 g 200 mL/hr over 30 Minutes Intravenous Every 8 hours 03/20/21 2208 03/24/21 1329   03/20/21 1315  clindamycin (CLEOCIN) capsule 300 mg  Status:  Discontinued        300 mg Oral  Once 03/20/21 1306 03/20/21 1338       Subjective: Patient was seen and examined at bedside.  Overnight events noted.   Patient s/p right foot first ray amputation, tolerated well.   Patient is alert, following commands.  Reports he is in a lot of pain.  Objective: Vitals:   04/01/21 2306 04/02/21 0316 04/02/21 0854 04/02/21 1154  BP: (!) 155/60 (!) 147/67 (!) 131/56 100/68  Pulse: 80 88 95 98  Resp: 18 19 18 18   Temp: 97.7 F (36.5 C) 97.7 F (36.5 C) 97.8 F (36.6 C) 97.9 F (36.6 C)  TempSrc: Oral Oral Oral Oral  SpO2: 98% 97% 92% 99%  Weight:  63.7 kg    Height:        Intake/Output Summary (Last 24 hours) at 04/02/2021 1433 Last data filed at 04/02/2021 1146 Gross per 24 hour  Intake 1275 ml  Output 1125 ml  Net 150 ml    Filed Weights   04/01/21 0325 04/01/21 1017 04/02/21 0316  Weight: 63 kg 63 kg 63.7 kg    Examination:  General exam: Appears comfortable, not in any acute distress, chronically ill looking.  Pacemaker visible. Respiratory system: Clear to auscultation. Respiratory effort normal. Cardiovascular system: S1-S2 heard, regular rate and rhythm, no murmur. Gastrointestinal system: Abdomen is soft, nontender, nondistended, BS + Central nervous system: Alert and oriented x 1. No focal neurological deficits. Extremities: Right first great toe amputation, right foot wrapped with wound VAC. Skin: No rashes, lesions or ulcers Psychiatry: Judgement and insight appear normal. Mood & affect appropriate.     Data Reviewed: I have personally reviewed following labs and imaging studies  CBC: Recent Labs  Lab 03/27/21 0038 03/29/21 0132 03/30/21 0606 03/31/21 0207 04/01/21 0210  WBC 6.4 6.0 3.9* 3.9* 4.1  NEUTROABS  --  4.0 1.9 2.1 2.4  HGB 9.0* 7.5* 7.8* 8.2* 8.0*  HCT 29.2* 23.9* 24.8* 27.0* 25.7*  MCV 80.4 79.9* 80.8 82.8 80.8  PLT 209 150 179 216 557   Basic Metabolic Panel: Recent Labs  Lab 03/29/21  0132 03/30/21 0606 03/31/21 0207 04/01/21 0210 04/02/21 0146  NA 136 137 138 139 135  K 3.8 3.9 3.9 4.0 4.1  CL 104 103 106 106 102  CO2 27 27 26 29 26   GLUCOSE 162* 104* 101* 123* 102*  BUN 19 18 18 13 14   CREATININE 0.73 0.69 0.72 0.68 0.64  CALCIUM 9.4 9.3 9.5 9.7 9.4  MG  --   --   --   --  1.9  PHOS  --   --   --   --  2.3*   GFR: Estimated Creatinine Clearance: 65.2 mL/min (by C-G formula based on SCr of 0.64 mg/dL). Liver Function Tests: No results for input(s): AST, ALT, ALKPHOS, BILITOT, PROT, ALBUMIN in the last 168 hours. No results for input(s): LIPASE, AMYLASE in the last 168 hours. No results for input(s): AMMONIA in the last 168 hours. Coagulation Profile: No results for input(s): INR, PROTIME in the last 168 hours. Cardiac Enzymes: No results for  input(s): CKTOTAL, CKMB, CKMBINDEX, TROPONINI in the last 168 hours. BNP (last 3 results) No results for input(s): PROBNP in the last 8760 hours. HbA1C: No results for input(s): HGBA1C in the last 72 hours. CBG: Recent Labs  Lab 03/26/21 2121 03/27/21 0619  GLUCAP 98 127*   Lipid Profile: No results for input(s): CHOL, HDL, LDLCALC, TRIG, CHOLHDL, LDLDIRECT in the last 72 hours. Thyroid Function Tests: No results for input(s): TSH, T4TOTAL, FREET4, T3FREE, THYROIDAB in the last 72 hours. Anemia Panel: No results for input(s): VITAMINB12, FOLATE, FERRITIN, TIBC, IRON, RETICCTPCT in the last 72 hours. Sepsis Labs: No results for input(s): PROCALCITON, LATICACIDVEN in the last 168 hours.  Recent Results (from the past 240 hour(s))  MRSA Next Gen by PCR, Nasal     Status: None   Collection Time: 03/26/21 12:29 AM   Specimen: Nasal Mucosa; Nasal Swab  Result Value Ref Range Status   MRSA by PCR Next Gen NOT DETECTED NOT DETECTED Final    Comment: (NOTE) The GeneXpert MRSA Assay (FDA approved for NASAL specimens only), is one component of a comprehensive MRSA colonization surveillance program. It is not intended to diagnose MRSA infection nor to guide or monitor treatment for MRSA infections. Test performance is not FDA approved in patients less than 58 years old. Performed at [facility], [place] [address] [place], [place] [address]   Surgical PCR screen     Status: None   Collection Time: 03/31/21 10:44 PM   Specimen: Nasal Mucosa; Nasal Swab  Result Value Ref Range Status   MRSA, PCR NEGATIVE NEGATIVE Final   Staphylococcus aureus NEGATIVE NEGATIVE Final    Comment: (NOTE) The Xpert SA Assay (FDA approved for NASAL specimens in patients 60 years of age and older), is one component of a comprehensive surveillance program. It is not intended to diagnose infection nor to guide or monitor treatment. Performed at [facility], [place] [address] [place],  [place] [address]          Radiology Studies: No results found.      Scheduled Meds:  amLODipine  10 mg Oral Daily   vitamin C  1,000 mg Oral Daily   Chlorhexidine Gluconate Cloth  6 each Topical Daily   clopidogrel  75 mg Oral QHS   docusate sodium  100 mg Oral Daily   enoxaparin (LOVENOX) injection  40 mg Subcutaneous Q24H   famotidine  10 mg Oral Daily   feeding supplement  237 mL Oral TID BM  finasteride  5 mg Oral Daily   hydrALAZINE  50 mg Oral BID   isosorbide mononitrate  60 mg Oral Daily    morphine injection  2 mg Intravenous Once   multivitamin with minerals  1 tablet Oral Daily   mupirocin ointment  1 application Nasal BID   nutrition supplement (JUVEN)  1 packet Oral BID BM   pantoprazole  40 mg Oral Daily   rosuvastatin  10 mg Oral Daily   senna-docusate  1 tablet Oral BID   sodium chloride flush  3 mL Intravenous Q12H   sodium chloride flush  3 mL Intravenous Q12H   tamsulosin  0.4 mg Oral Daily   zinc sulfate  220 mg Oral Daily   Continuous Infusions:  sodium chloride 250 mL (03/22/21 5953)   sodium chloride 10 mL/hr at 04/01/21 1432   sodium chloride     sodium chloride     sodium chloride 75 mL/hr at 04/01/21 1434   magnesium sulfate bolus IVPB       LOS: 13 days    Time spent: 25 mins    Bao Bazen, MD [facility]   If 7PM-7AM, please contact night-coverage

## 2021-04-03 DIAGNOSIS — T148XXA Other injury of unspecified body region, initial encounter: Secondary | ICD-10-CM | POA: Diagnosis not present

## 2021-04-03 DIAGNOSIS — L089 Local infection of the skin and subcutaneous tissue, unspecified: Secondary | ICD-10-CM | POA: Diagnosis not present

## 2021-04-03 LAB — BASIC METABOLIC PANEL
Anion gap: 5 (ref 5–15)
BUN: 18 mg/dL (ref 8–23)
CO2: 28 mmol/L (ref 22–32)
Calcium: 9.7 mg/dL (ref 8.9–10.3)
Chloride: 104 mmol/L (ref 98–111)
Creatinine, Ser: 0.72 mg/dL (ref 0.61–1.24)
GFR, Estimated: >60 mL/min (ref >60)
Glucose, Bld: 111 mg/dL — ABNORMAL HIGH (ref 70–99)
Potassium: 4.2 mmol/L (ref 3.5–5.1)
Sodium: 137 mmol/L (ref 135–145)

## 2021-04-03 MED ORDER — HALOPERIDOL LACTATE 5 MG/ML IJ SOLN
1.0000 mg | Freq: Four times a day (QID) | INTRAMUSCULAR | Status: DC | PRN
  Administered 2021-04-03: 1 mg via INTRAMUSCULAR
  Filled 2021-04-03: qty 1

## 2021-04-03 NOTE — Care Management Important Message (Signed)
Important Message  Patient Details  Name: Todd Dunn MRN: [id number] Date of Birth: 03/12/1941   Medicare Important Message Given:  Yes     Shelda Altes 04/03/2021, 10:43 AM

## 2021-04-03 NOTE — Progress Notes (Signed)
OT Cancellation Note  Patient Details Name: Todd Dunn MRN: [id number] DOB: 1941/04/14   Cancelled Treatment:    Reason Eval/Treat Not Completed: Other (comment) family contacted via phone call about family education session. At that time, family in agreement with session on 10/28 at 9am. No family present in room at that time this am. OT will continue efforts in prep for safe d/c to next level of care.   Todd Dunn OTR/L Supplemental OT, Department of rehab services [phone]  Teola Bradley 04/03/2021, 12:21 PM

## 2021-04-03 NOTE — TOC Progression Note (Signed)
Transition of Care [facility]) - Progression Note    Patient Details  Name: Todd Dunn MRN: [id number] Date of Birth: Feb 03, 1941  Transition of Care [facility]) CM/SW [place], [place] Phone Number: 04/03/2021, 12:37 PM  Clinical Narrative:    CSW confirmed with MD that pt may be able to DC over the weekend. CSW restarted Josem Kaufmann which is pending. CSW spoke with Juliann Pulse at [facility] who confirmed they can accept pt when ready. TOC will continue to follow.   Expected Discharge Plan: [place] Barriers to Discharge: Continued Medical Work up  Expected Discharge Plan and Services Expected Discharge Plan: [place] In-house Referral: Clinical Social Work     Living arrangements for the past 2 months: Single Family Home                                       Social Determinants of Health (SDOH) Interventions    Readmission Risk Interventions Readmission Risk Prevention Plan 02/17/2021  Transportation Screening Complete  Medication Review Press photographer) Complete  PCP or Specialist appointment within 3-5 days of discharge Complete  HRI or Spray Complete  SW Recovery Care/Counseling Consult Complete  [place] Not Applicable

## 2021-04-03 NOTE — Progress Notes (Signed)
Speech Language Pathology Treatment: Dysphagia  Patient Details Name: Todd Dunn MRN: [id number] DOB: 11/16/1940 Today's Date: 04/03/2021 Time: 1230-1300 SLP Time Calculation (min) (ACUTE ONLY): 30 min  Assessment / Plan / Recommendation Clinical Impression  Pt seen for diet tolerance during mealtime this afternoon. RN reporting frequent coughing/throat clearing with dysphagia 2 solids during am meal. Safety with PO intake appears to be primarily impacted by reduced attention and impulsivity, increasing pt risk for aspiration. Clinician administered bites of dysphagia 2 solids with pt demonstrating prolonged mastication and intermittent oral holding when distracted by environmental stimuli. Overt coughing /throat clearing noted in x3 instances. Pureed solids were unremarkable despite fleeting attention and talking with POs. Max multimodal cues and elimination of some environmental distractions improved s/sx of aspiration, but note that cueing and redirections were constant throughout meal consumption. Postural control was also poor, which required constant repositioning and cueing of pt to sit upright. Given RN reports and clinical presentation this date, recommend downgrade to dys 1/thin liquid diet with SLP to f/u for tolerance and advanced trials pending improvement in mentation and/or reduction in s/sx of aspiration at bedside. Above discussed with RN.    HPI HPI: Pt with PMH significant for SSS s/p pacemaker, CVA w/ residual L hemiplegia, dysphagia and dysarthria, BPH, neurogenic bladder with chronic foley, and recurrent UTIs admitted with R great toe wound infection. 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 Pt has had cognitive changes that impact attention and safety with meals, but was most recently recommended a Mech soft/thin diet by clinical exam on 12/17/20      SLP Plan  Continue with current plan of care      Recommendations for follow up therapy are one component of a  multi-disciplinary discharge planning process, led by the attending physician.  Recommendations may be updated based on patient status, additional functional criteria and insurance authorization.    Recommendations  Diet recommendations: Dysphagia 1 (puree);Thin liquid Liquids provided via: Straw;Cup Medication Administration: Whole meds with liquid Supervision: Staff to assist with self feeding;Full supervision/cueing for compensatory strategies Compensations: Minimize environmental distractions;Slow rate;Small sips/bites;Follow solids with liquid Postural Changes and/or Swallow Maneuvers: Seated upright 90 degrees                Oral Care Recommendations: Oral care BID Follow up Recommendations: Skilled Nursing facility SLP Visit Diagnosis: Dysphagia, unspecified (R13.10) Plan: Continue with current plan of care       [place], [place], [place] Office Number: [phone]   [name]  04/03/2021, 1:13 PM

## 2021-04-03 NOTE — Progress Notes (Signed)
PT Cancellation Note  Patient Details Name: Todd Dunn MRN: [id number] DOB: [birth year]   Cancelled Treatment:    Reason Eval/Treat Not Completed: Family not present for scheduled 9AM appoint for education and observation of PT/OT session. Will follow-up for PT treatment as schedule permits.  Mabeline Caras, PT, DPT Acute Rehabilitation Services  Pager [phone] Office [place] 04/03/2021, 10:36 AM

## 2021-04-03 NOTE — Progress Notes (Signed)
PROGRESS NOTE    Todd Dunn  WUJ:[id number] DOB: 1941/01/01 DOA: 03/20/2021 PCP: Fredrich Romans, PA    Brief Narrative: This 80 y.o. male with PMH significant of CAD/CABG, PPM for SSS, ICM, CVA with residual left hemiplegia, dysphagia, and dysarthria, neurogenic bladder with chronic Foley, recurrent UTIs who lives at home with his granddaughter. Patient was brought to the ED on 10/14 with 2 weeks history of progressive right great toe infection without any antecedent trauma or ulcer.  X-ray right foot concerning for osteomyelitis of the lateral toe.  Patient was seen by vascular surgery and orthopedics.  Patient underwent first ray amputation.  Tolerated well. Patient was started on cefepime and linezolid. Wound swab culture was sent..  Assessment & Plan:   Principal Problem:   Wound infection Active Problems:   Catheter-associated urinary tract infection (HCC)   Chronic indwelling Foley catheter   HTN (hypertension)   Osteomyelitis of great toe of right foot (HCC)   Pacemaker   Elevated alkaline phosphatase level   Hyperlipidemia   Toe infection   PVD (peripheral vascular disease) (HCC)   Protein-calorie malnutrition, severe  Dry gangrene of the right great toe: Suspected osteomyelitis of right foot lateral tuft: Patient was evaluated by orthopedics and vascular surgery. WBC normal, lactic acid normal,  remains afebrile. Wound cultures growing staph aureus Patient is started on broad-spectrum antibiotics. Antibiotics discontinued on 10/21 per discussion with orthopedics. Patient underwent right foot first ray amputation 10/26. Right foot wrapped with wound VAC.  Critical limb ischemia of right lower extremity: Peripheral artery disease Patient underwent aortogram and right lower extremity angiography  on 10/17 Per report, heavily calcified aorta with patent renal arteries, severely decreased flow, occluded right SFA.  Patient underwent angioplasty of the right  posterior tibial artery and bypass of the right common femoral artery to posterior tibial artery with prosthetic graft on 10/20 Continue Plavix, statin after his CVA. Patient is cleared from vascular surgery with improved inflow to heal the amputation site. Vascular surgery recommended follow-up in 4 weeks with ABI and duplex of bypass.  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 Continue Imdur, hydralazine and amlodipine. Lasix and lisinopril are on hold. Hemodynamically stable.  CAD/CABG in 2014: Denies any chest pain. Continue Plavix, Imdur, statin  History of SSS s/p pacemaker Currently paced rhythm.  History of CVA with residual left hemiplegia: Continue statins and Plavix Patient is wheelchair-bound at baseline.  UTI related to chronic Foley: Neurogenic bladder /chronic indwelling Foley. Urine culture grew 70K of Proteus mirabilis Foley changed on 10/14 in the ED Continue Proscar and Flomax  Acute metabolic encephalopathy: Patient has intermittent restlessness, agitation, currently stable. Per granddaughter, patient tends to have these episodes when he gets UTI.   She states that his stroke was several years ago and did not leave any residual cognitive deficit or change in mental status. Continue to monitor mental status change.   Normocytic normochromic anemia: No evidence of bleeding at this point. Hemoglobin remained stable.  Continue to monitor  Depression/anxiety: Continue Celexa  DVT prophylaxis:  Lovenox Code Status: (Full code) Family Communication: No family at bed side. Disposition Plan:    Status is: Inpatient  Remains inpatient appropriate because: Osteomyelitis of left great toe underwent first ray amputation.  Anticipated discharge to skilled nursing facility in few days.  Consultants:  Orthopedics Vascular surgery  Procedures: First ray amputation of great toe. Right foot antimicrobials:   Anti-infectives  (From admission, onward)    Start  Dose/Rate Route Frequency Ordered Stop   04/01/21 1800  ceFAZolin (ANCEF) IVPB 2g/100 mL premix        2 g 200 mL/hr over 30 Minutes Intravenous Every 8 hours 04/01/21 1349 04/02/21 0204   04/01/21 0600  ceFAZolin (ANCEF) IVPB 2g/100 mL premix        2 g 200 mL/hr over 30 Minutes Intravenous On call to O.R. 03/31/21 2003 04/01/21 1218   03/26/21 1600  vancomycin (VANCOCIN) IVPB 1000 mg/200 mL premix        1,000 mg 200 mL/hr over 60 Minutes Intravenous Every 12 hours 03/26/21 1346 03/28/21 0958   03/24/21 1415  ceFAZolin (ANCEF) IVPB 2g/100 mL premix  Status:  Discontinued        2 g 200 mL/hr over 30 Minutes Intravenous Every 8 hours 03/24/21 1329 03/27/21 1049   03/20/21 2215  linezolid (ZYVOX) IVPB 600 mg  Status:  Discontinued        600 mg 300 mL/hr over 60 Minutes Intravenous Every 12 hours 03/20/21 2208 03/24/21 1329   03/20/21 2215  ceFEPIme (MAXIPIME) 2 g in sodium chloride 0.9 % 100 mL IVPB  Status:  Discontinued        2 g 200 mL/hr over 30 Minutes Intravenous Every 8 hours 03/20/21 2208 03/24/21 1329   03/20/21 1315  clindamycin (CLEOCIN) capsule 300 mg  Status:  Discontinued        300 mg Oral  Once 03/20/21 1306 03/20/21 1338       Subjective: Patient was seen and examined at bedside.  Overnight events noted.   Patient s/p right foot first ray amputation, tolerated well Patient is alert,  oriented x1, following commands.  reports he is in a lot of pain.  Objective: Vitals:   04/03/21 0017 04/03/21 0343 04/03/21 0812 04/03/21 1212  BP: (!) 127/58 (!) 121/56 (!) 103/51 (!) 111/50  Pulse: 70 62 73   Resp: 17 17 18    Temp: 97.8 F (36.6 C) 98.2 F (36.8 C) 98.6 F (37 C)   TempSrc: Axillary Oral Oral   SpO2: 100% 93% 100%   Weight:      Height:        Intake/Output Summary (Last 24 hours) at 04/03/2021 1408 Last data filed at 04/03/2021 0842 Gross per 24 hour  Intake 797.59 ml  Output 450 ml  Net 347.59 ml   Filed  Weights   04/01/21 0325 04/01/21 1017 04/02/21 0316  Weight: 63 kg 63 kg 63.7 kg    Examination:  General exam: Appears comfortable, not in any acute distress, chronically ill looking, pacemaker visible. Respiratory system: Clear to auscultation. Respiratory effort normal. Cardiovascular system: S1-S2 heard, regular rate and rhythm, no murmur. Gastrointestinal system: Abdomen is soft, nontender, nondistended, BS + Central nervous system: Alert and oriented x 1. No focal neurological deficits. Extremities: Right first great toe amputation, right foot wrapped with wound VAC. Skin: No rashes, lesions or ulcers Psychiatry: Judgement and insight appear normal. Mood & affect appropriate.     Data Reviewed: I have personally reviewed following labs and imaging studies  CBC: Recent Labs  Lab 03/29/21 0132 03/30/21 0606 03/31/21 0207 04/01/21 0210  WBC 6.0 3.9* 3.9* 4.1  NEUTROABS 4.0 1.9 2.1 2.4  HGB 7.5* 7.8* 8.2* 8.0*  HCT 23.9* 24.8* 27.0* 25.7*  MCV 79.9* 80.8 82.8 80.8  PLT 150 179 216 245   Basic Metabolic Panel: Recent Labs  Lab 03/30/21 0606 03/31/21 0207 04/01/21 0210 04/02/21 0146 04/03/21 0152  NA 137 138  139 135 137  K 3.9 3.9 4.0 4.1 4.2  CL 103 106 106 102 104  CO2 27 26 29 26 28   GLUCOSE 104* 101* 123* 102* 111*  BUN 18 18 13 14 18   CREATININE 0.69 0.72 0.68 0.64 0.72  CALCIUM 9.3 9.5 9.7 9.4 9.7  MG  --   --   --  1.9  --   PHOS  --   --   --  2.3*  --    GFR: Estimated Creatinine Clearance: 65.2 mL/min (by C-G formula based on SCr of 0.72 mg/dL). Liver Function Tests: No results for input(s): AST, ALT, ALKPHOS, BILITOT, PROT, ALBUMIN in the last 168 hours. No results for input(s): LIPASE, AMYLASE in the last 168 hours. No results for input(s): AMMONIA in the last 168 hours. Coagulation Profile: No results for input(s): INR, PROTIME in the last 168 hours. Cardiac Enzymes: No results for input(s): CKTOTAL, CKMB, CKMBINDEX, TROPONINI in the last 168  hours. BNP (last 3 results) No results for input(s): PROBNP in the last 8760 hours. HbA1C: No results for input(s): HGBA1C in the last 72 hours. CBG: No results for input(s): GLUCAP in the last 168 hours.  Lipid Profile: No results for input(s): CHOL, HDL, LDLCALC, TRIG, CHOLHDL, LDLDIRECT in the last 72 hours. Thyroid Function Tests: No results for input(s): TSH, T4TOTAL, FREET4, T3FREE, THYROIDAB in the last 72 hours. Anemia Panel: No results for input(s): VITAMINB12, FOLATE, FERRITIN, TIBC, IRON, RETICCTPCT in the last 72 hours. Sepsis Labs: No results for input(s): PROCALCITON, LATICACIDVEN in the last 168 hours.  Recent Results (from the past 240 hour(s))  MRSA Next Gen by PCR, Nasal     Status: None   Collection Time: 03/26/21 12:29 AM   Specimen: Nasal Mucosa; Nasal Swab  Result Value Ref Range Status   MRSA by PCR Next Gen NOT DETECTED NOT DETECTED Final    Comment: (NOTE) The GeneXpert MRSA Assay (FDA approved for NASAL specimens only), is one component of a comprehensive MRSA colonization surveillance program. It is not intended to diagnose MRSA infection nor to guide or monitor treatment for MRSA infections. Test performance is not FDA approved in patients less than 74 years old. Performed at [facility], [place] [address] [place], [place] [address]   Surgical PCR screen     Status: None   Collection Time: 03/31/21 10:44 PM   Specimen: Nasal Mucosa; Nasal Swab  Result Value Ref Range Status   MRSA, PCR NEGATIVE NEGATIVE Final   Staphylococcus aureus NEGATIVE NEGATIVE Final    Comment: (NOTE) The Xpert SA Assay (FDA approved for NASAL specimens in patients 82 years of age and older), is one component of a comprehensive surveillance program. It is not intended to diagnose infection nor to guide or monitor treatment. Performed at [facility], [place] [address] [place], [place] [address]          Radiology Studies: No results  found.      Scheduled Meds:  amLODipine  10 mg Oral Daily   vitamin C  1,000 mg Oral Daily   Chlorhexidine Gluconate Cloth  6 each Topical Daily   clopidogrel  75 mg Oral QHS   docusate sodium  100 mg Oral Daily   enoxaparin (LOVENOX) injection  40 mg Subcutaneous Q24H   famotidine  10 mg Oral Daily   feeding supplement  237 mL Oral TID BM   finasteride  5 mg Oral Daily   hydrALAZINE  50 mg Oral BID  isosorbide mononitrate  60 mg Oral Daily    morphine injection  2 mg Intravenous Once   multivitamin with minerals  1 tablet Oral Daily   mupirocin ointment  1 application Nasal BID   nutrition supplement (JUVEN)  1 packet Oral BID BM   pantoprazole  40 mg Oral Daily   rosuvastatin  10 mg Oral Daily   senna-docusate  1 tablet Oral BID   sodium chloride flush  3 mL Intravenous Q12H   sodium chloride flush  3 mL Intravenous Q12H   tamsulosin  0.4 mg Oral Daily   zinc sulfate  220 mg Oral Daily   Continuous Infusions:  sodium chloride 250 mL (03/22/21 2811)   sodium chloride 10 mL/hr at 04/01/21 1432   sodium chloride     sodium chloride     sodium chloride 75 mL/hr at 04/01/21 1434   magnesium sulfate bolus IVPB       LOS: 14 days    Time spent: 25 mins    Jordie Schreur, MD [facility]   If 7PM-7AM, please contact night-coverage

## 2021-04-03 NOTE — Progress Notes (Addendum)
[facility]  Subjective  - Alert but confused   Objective (!) 121/56 62 98.2 F (36.8 C) (Oral) 17 93%  Intake/Output Summary (Last 24 hours) at 04/03/2021 0804 Last data filed at 04/03/2021 0350 Gross per 24 hour  Intake 1157.59 ml  Output 775 ml  Net 382.59 ml    Right foot with dressing wound vac in place to suction Right groin soft and healing well Motor intact right LE, palpable PT pulse  Lungs non labored breathing   Assessment/Planning: 80 y.o. male is s/p right CF to PT bypass with PTFE 8 Days Post-Op  POD#1 DR. Sharol Given with first ray amputation and wound vac placement.  Wound 10x4 cm closed with nylon suture  He is stable from vascular standpoint with improved inflow to heal the amputation site.  Improve arterial flow to right LE to assist with amputation site healing. F/U in 4 weeks with ABI and duplex of bypass DVT prophylaxis Lovenox   Roxy Horseman 04/03/2021 8:04 AM --  Laboratory Lab Results: Recent Labs    04/01/21 0210  WBC 4.1  HGB 8.0*  HCT 25.7*  PLT 236   BMET Recent Labs    04/02/21 0146 04/03/21 0152  NA 135 137  K 4.1 4.2  CL 102 104  CO2 26 28  GLUCOSE 102* 111*  BUN 14 18  CREATININE 0.64 0.72  CALCIUM 9.4 9.7    COAG Lab Results  Component Value Date   INR 1.1 02/14/2021   INR 1.1 01/21/2021   INR 1.2 09/15/2020   No results found for: PTT  VASCULAR STAFF ADDENDUM: I have independently interviewed and examined the patient. I agree with the above.  Please call with questions.  Yevonne Aline. Stanford Breed, MD [facility] Phone Number: [phone] 04/04/2021 10:07 AM

## 2021-04-03 NOTE — TOC Progression Note (Signed)
Transition of Care [facility]) - Progression Note    Patient Details  Name: Todd Dunn MRN: [id number] Date of Birth: [birth year]  Transition of Care [facility]) CM/SW [place], LCSW Phone Number: 04/03/2021, 5:17 PM  Clinical Narrative:    Biochemist, clinical received for [employer]: Auth ID# [id number]# [id number], effective 04/04/2021-04/08/2021.   Expected Discharge Plan: [place] Barriers to Discharge: Continued Medical Work up  Expected Discharge Plan and Services Expected Discharge Plan:  In-house Referral: Clinical Social Work     Living arrangements for the past 2 months: Single Family Home                                       Social Determinants of Health (SDOH) Interventions    Readmission Risk Interventions Readmission Risk Prevention Plan 02/17/2021  Transportation Screening Complete  Medication Review [employer]) Complete  PCP or Specialist appointment within 3-5 days of discharge Complete  HRI or [place] Complete  SW Recovery Care/Counseling Consult Complete  [place] Not Applicable

## 2021-04-04 DIAGNOSIS — L089 Local infection of the skin and subcutaneous tissue, unspecified: Secondary | ICD-10-CM | POA: Diagnosis not present

## 2021-04-04 DIAGNOSIS — T148XXA Other injury of unspecified body region, initial encounter: Secondary | ICD-10-CM | POA: Diagnosis not present

## 2021-04-04 LAB — BASIC METABOLIC PANEL
Anion gap: 4 — ABNORMAL LOW (ref 5–15)
BUN: 21 mg/dL (ref 8–23)
CO2: 27 mmol/L (ref 22–32)
Calcium: 9.6 mg/dL (ref 8.9–10.3)
Chloride: 103 mmol/L (ref 98–111)
Creatinine, Ser: 0.71 mg/dL (ref 0.61–1.24)
GFR, Estimated: >60 mL/min (ref >60)
Glucose, Bld: 124 mg/dL — ABNORMAL HIGH (ref 70–99)
Potassium: 4.5 mmol/L (ref 3.5–5.1)
Sodium: 134 mmol/L — ABNORMAL LOW (ref 135–145)

## 2021-04-04 LAB — RESP PANEL BY RT-PCR (FLU A&B, COVID) ARPGX2
Influenza A by PCR: NEGATIVE
Influenza B by PCR: NEGATIVE
SARS Coronavirus 2 by RT PCR: NEGATIVE

## 2021-04-04 NOTE — Progress Notes (Signed)
PROGRESS NOTE    Todd Dunn  HWT:[id number] DOB: 1941/04/18 DOA: 03/20/2021 PCP: Fredrich Romans, PA    Brief Narrative: This 80 y.o. male with PMH significant of CAD/CABG, PPM for SSS, ICM, CVA with residual left hemiplegia, dysphagia, and dysarthria, neurogenic bladder with chronic Foley, recurrent UTIs who lives at home with his granddaughter. Patient was brought to the ED on 10/14 with 2 weeks history of progressive right great toe infection without any antecedent trauma or ulcer.  X-ray right foot concerning for osteomyelitis of the lateral toe.  Patient was seen by vascular surgery and orthopedics.  Patient underwent first ray amputation.  Tolerated well. Patient was started on cefepime and linezolid. Wound swab culture was sent..  Assessment & Plan:   Principal Problem:   Wound infection Active Problems:   Catheter-associated urinary tract infection (HCC)   Chronic indwelling Foley catheter   HTN (hypertension)   Osteomyelitis of great toe of right foot (HCC)   Pacemaker   Elevated alkaline phosphatase level   Hyperlipidemia   Toe infection   PVD (peripheral vascular disease) (HCC)   Protein-calorie malnutrition, severe  Dry gangrene of the right great toe: Suspected osteomyelitis of right foot lateral tuft: Patient was evaluated by orthopedics and vascular surgery. WBC normal, lactic acid normal,  remains afebrile. Wound cultures growing staph aureus Patient is started on broad-spectrum antibiotics. Antibiotics discontinued on 10/21 per discussion with orthopedics. Patient underwent right foot first ray amputation 10/26. Right foot wrapped with wound VAC.  Critical limb ischemia of right lower extremity: Peripheral artery disease Patient underwent aortogram and right lower extremity angiography  on 10/17 Per report, heavily calcified aorta with patent renal arteries, severely decreased flow, occluded right SFA.  Patient underwent angioplasty of the right  posterior tibial artery and bypass of the right common femoral artery to posterior tibial artery with prosthetic graft on 10/20 Continue Plavix, statin after his CVA. Patient is cleared from vascular surgery with improved inflow to heal the amputation site. Vascular surgery recommended follow-up in 4 weeks with ABI and duplex of bypass.  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 Continue Imdur, hydralazine and amlodipine. Lasix and lisinopril are on hold. Hemodynamically stable.  CAD/CABG in 2014: Denies any chest pain. Continue Plavix, Imdur, statin  History of SSS s/p pacemaker Currently paced rhythm.  History of CVA with residual left hemiplegia: Continue statins and Plavix Patient is wheelchair-bound at baseline.  UTI related to chronic Foley: Neurogenic bladder /chronic indwelling Foley. Urine culture grew 70K of Proteus mirabilis Foley changed on 10/14 in the ED Continue Proscar and Flomax  Acute metabolic encephalopathy: Patient has intermittent restlessness, agitation, currently stable. Per granddaughter, patient tends to have these episodes when he gets UTI.   She states that his stroke was several years ago and did not leave any residual cognitive deficit or change in mental status. Continue to monitor mental status change.   Normocytic normochromic anemia: No evidence of bleeding at this point. Hemoglobin remained stable.  Continue to monitor  Depression/anxiety: Continue Celexa  DVT prophylaxis:  Lovenox Code Status: (Full code) Family Communication: No family at bed side. Disposition Plan:    Status is: Inpatient  Remains inpatient appropriate because: Osteomyelitis of left great toe underwent first ray amputation.  Anticipated discharge to skilled nursing facility in 1-2 days  Consultants:  Orthopedics Vascular surgery  Procedures: First ray amputation of great toe. Right foot antimicrobials:   Anti-infectives  (From admission, onward)    Start  Dose/Rate Route Frequency Ordered Stop   04/01/21 1800  ceFAZolin (ANCEF) IVPB 2g/100 mL premix        2 g 200 mL/hr over 30 Minutes Intravenous Every 8 hours 04/01/21 1349 04/02/21 0204   04/01/21 0600  ceFAZolin (ANCEF) IVPB 2g/100 mL premix        2 g 200 mL/hr over 30 Minutes Intravenous On call to O.R. 03/31/21 2003 04/01/21 1218   03/26/21 1600  vancomycin (VANCOCIN) IVPB 1000 mg/200 mL premix        1,000 mg 200 mL/hr over 60 Minutes Intravenous Every 12 hours 03/26/21 1346 03/28/21 0958   03/24/21 1415  ceFAZolin (ANCEF) IVPB 2g/100 mL premix  Status:  Discontinued        2 g 200 mL/hr over 30 Minutes Intravenous Every 8 hours 03/24/21 1329 03/27/21 1049   03/20/21 2215  linezolid (ZYVOX) IVPB 600 mg  Status:  Discontinued        600 mg 300 mL/hr over 60 Minutes Intravenous Every 12 hours 03/20/21 2208 03/24/21 1329   03/20/21 2215  ceFEPIme (MAXIPIME) 2 g in sodium chloride 0.9 % 100 mL IVPB  Status:  Discontinued        2 g 200 mL/hr over 30 Minutes Intravenous Every 8 hours 03/20/21 2208 03/24/21 1329   03/20/21 1315  clindamycin (CLEOCIN) capsule 300 mg  Status:  Discontinued        300 mg Oral  Once 03/20/21 1306 03/20/21 1338       Subjective: Patient was seen and examined at bedside.  Overnight events noted.   Patient s/p right foot first ray amputation, tolerated well Patient is alert, oriented,  seems confused but following partial commands.  Patient reports in a lot of pain.  Objective: Vitals:   04/03/21 2309 04/04/21 0333 04/04/21 0435 04/04/21 0900  BP: (!) 90/46 (!) 99/47 (!) 116/46 120/62  Pulse: 75 64 73 78  Resp: 19 14 17 20   Temp: 98.2 F (36.8 C) 98.4 F (36.9 C) 98.2 F (36.8 C) 97.8 F (36.6 C)  TempSrc: Oral Oral Oral Axillary  SpO2: 99% 100% 100%   Weight:      Height:        Intake/Output Summary (Last 24 hours) at 04/04/2021 1138 Last data filed at 04/04/2021 0456 Gross per 24 hour  Intake 977.88  ml  Output 700 ml  Net 277.88 ml   Filed Weights   04/01/21 0325 04/01/21 1017 04/02/21 0316  Weight: 63 kg 63 kg 63.7 kg    Examination:  General exam: Appears comfortable, not in any acute distress, chronically ill looking, pacemaker visible. Respiratory system: Clear to auscultation. Respiratory effort normal. Cardiovascular system: S1-S2 heard, regular rate and rhythm, no murmur. Gastrointestinal system: Abdomen is soft, nontender, nondistended, BS + Central nervous system: Alert and oriented x 1. Confused but following partial commands, No focal  neurological deficits. Extremities: Right first great toe amputation, right foot wrapped with wound VAC. Skin: No rashes, lesions or ulcers Psychiatry: Judgement and insight appear normal. Mood & affect appropriate.     Data Reviewed: I have personally reviewed following labs and imaging studies  CBC: Recent Labs  Lab 03/29/21 0132 03/30/21 0606 03/31/21 0207 04/01/21 0210  WBC 6.0 3.9* 3.9* 4.1  NEUTROABS 4.0 1.9 2.1 2.4  HGB 7.5* 7.8* 8.2* 8.0*  HCT 23.9* 24.8* 27.0* 25.7*  MCV 79.9* 80.8 82.8 80.8  PLT 150 179 216 355   Basic Metabolic Panel: Recent Labs  Lab 03/31/21 0207 04/01/21 0210  04/02/21 0146 04/03/21 0152 04/04/21 0115  NA 138 139 135 137 134*  K 3.9 4.0 4.1 4.2 4.5  CL 106 106 102 104 103  CO2 26 29 26 28 27   GLUCOSE 101* 123* 102* 111* 124*  BUN 18 13 14 18 21   CREATININE 0.72 0.68 0.64 0.72 0.71  CALCIUM 9.5 9.7 9.4 9.7 9.6  MG  --   --  1.9  --   --   PHOS  --   --  2.3*  --   --    GFR: Estimated Creatinine Clearance: 65.2 mL/min (by C-G formula based on SCr of 0.71 mg/dL). Liver Function Tests: No results for input(s): AST, ALT, ALKPHOS, BILITOT, PROT, ALBUMIN in the last 168 hours. No results for input(s): LIPASE, AMYLASE in the last 168 hours. No results for input(s): AMMONIA in the last 168 hours. Coagulation Profile: No results for input(s): INR, PROTIME in the last 168  hours. Cardiac Enzymes: No results for input(s): CKTOTAL, CKMB, CKMBINDEX, TROPONINI in the last 168 hours. BNP (last 3 results) No results for input(s): PROBNP in the last 8760 hours. HbA1C: No results for input(s): HGBA1C in the last 72 hours. CBG: No results for input(s): GLUCAP in the last 168 hours.  Lipid Profile: No results for input(s): CHOL, HDL, LDLCALC, TRIG, CHOLHDL, LDLDIRECT in the last 72 hours. Thyroid Function Tests: No results for input(s): TSH, T4TOTAL, FREET4, T3FREE, THYROIDAB in the last 72 hours. Anemia Panel: No results for input(s): VITAMINB12, FOLATE, FERRITIN, TIBC, IRON, RETICCTPCT in the last 72 hours. Sepsis Labs: No results for input(s): PROCALCITON, LATICACIDVEN in the last 168 hours.  Recent Results (from the past 240 hour(s))  MRSA Next Gen by PCR, Nasal     Status: None   Collection Time: 03/26/21 12:29 AM   Specimen: Nasal Mucosa; Nasal Swab  Result Value Ref Range Status   MRSA by PCR Next Gen NOT DETECTED NOT DETECTED Final    Comment: (NOTE) The GeneXpert MRSA Assay (FDA approved for NASAL specimens only), is one component of a comprehensive MRSA colonization surveillance program. It is not intended to diagnose MRSA infection nor to guide or monitor treatment for MRSA infections. Test performance is not FDA approved in patients less than 43 years old. Performed at [facility], [place] [address], [place], [place] [address]   Surgical PCR screen     Status: None   Collection Time: 03/31/21 10:44 PM   Specimen: Nasal Mucosa; Nasal Swab  Result Value Ref Range Status   MRSA, PCR NEGATIVE NEGATIVE Final   Staphylococcus aureus NEGATIVE NEGATIVE Final    Comment: (NOTE) The Xpert SA Assay (FDA approved for NASAL specimens in patients 59 years of age and older), is one component of a comprehensive surveillance program. It is not intended to diagnose infection nor to guide or monitor treatment. Performed at [facility], [place] [address], [place], [place] [address]          Radiology Studies: No results found.      Scheduled Meds:  amLODipine  10 mg Oral Daily   vitamin C  1,000 mg Oral Daily   Chlorhexidine Gluconate Cloth  6 each Topical Daily   clopidogrel  75 mg Oral QHS   docusate sodium  100 mg Oral Daily   enoxaparin (LOVENOX) injection  40 mg Subcutaneous Q24H   famotidine  10 mg Oral Daily   feeding supplement  237 mL Oral TID BM   finasteride  5 mg Oral  Daily   hydrALAZINE  50 mg Oral BID   isosorbide mononitrate  60 mg Oral Daily    morphine injection  2 mg Intravenous Once   multivitamin with minerals  1 tablet Oral Daily   mupirocin ointment  1 application Nasal BID   nutrition supplement (JUVEN)  1 packet Oral BID BM   pantoprazole  40 mg Oral Daily   rosuvastatin  10 mg Oral Daily   senna-docusate  1 tablet Oral BID   sodium chloride flush  3 mL Intravenous Q12H   sodium chloride flush  3 mL Intravenous Q12H   tamsulosin  0.4 mg Oral Daily   zinc sulfate  220 mg Oral Daily   Continuous Infusions:  sodium chloride 250 mL (03/22/21 7340)   sodium chloride 10 mL/hr at 04/01/21 1432   sodium chloride     sodium chloride Stopped (04/02/21 1714)   magnesium sulfate bolus IVPB       LOS: 15 days    Time spent: 25 mins    Natale Barba, MD [facility]   If 7PM-7AM, please contact night-coverage

## 2021-04-04 NOTE — TOC Progression Note (Addendum)
Transition of Care [facility]) - Progression Note    Patient Details  Name: Todd Dunn MRN: [id number] Date of Birth: 1940/09/24  Transition of Care [facility]) CM/SW [place], LCSW Phone Number: 04/04/2021, 12:00 PM  Clinical Narrative:    12pm- CSW notified [facility] that patient will likely be ready for discharge tomorrow and will come with a Prevena wound vac. Admissions is checking to make sure they still have the bed available.   12:18pm- Juliann Pulse with [facility] confirmed that they can accept patient tomorrow. COVID test requested.    Expected Discharge Plan: [place] Barriers to Discharge: Continued Medical Work up  Expected Discharge Plan and Services Expected Discharge Plan: [place] In-house Referral: Clinical Social Work     Living arrangements for the past 2 months: Single Family Home                                       Social Determinants of Health (SDOH) Interventions    Readmission Risk Interventions Readmission Risk Prevention Plan 02/17/2021  Transportation Screening Complete  Medication Review Press photographer) Complete  PCP or Specialist appointment within 3-5 days of discharge Complete  HRI or [place] Complete  SW Recovery Care/Counseling Consult Complete  [place] Not Applicable

## 2021-04-04 NOTE — Progress Notes (Signed)
Patient ID: Todd Dunn, male   DOB: 28-Jul-1940, 80 y.o.   MRN: [id number] Patient is 3 days status post right great toe amputation.  Anticipate discharge to skilled nursing.  We will continue with the Praveena wound VAC pump and transition to dry dressing change at 1 week.

## 2021-04-05 DIAGNOSIS — T148XXA Other injury of unspecified body region, initial encounter: Secondary | ICD-10-CM | POA: Diagnosis not present

## 2021-04-05 DIAGNOSIS — L089 Local infection of the skin and subcutaneous tissue, unspecified: Secondary | ICD-10-CM | POA: Diagnosis not present

## 2021-04-05 MED ORDER — PANTOPRAZOLE SODIUM 40 MG PO TBEC: 40.0000 mg | DELAYED_RELEASE_TABLET | Freq: Every day | ORAL | 1 refills | Status: DC

## 2021-04-05 MED ORDER — BISACODYL 5 MG PO TBEC: 5.0000 mg | DELAYED_RELEASE_TABLET | Freq: Every day | ORAL | 0 refills | Status: DC | PRN

## 2021-04-05 MED ORDER — HYDROCODONE-ACETAMINOPHEN 5-325 MG PO TABS: 1.0000 | ORAL_TABLET | ORAL | 0 refills | Status: DC | PRN

## 2021-04-05 NOTE — Plan of Care (Signed)

## 2021-04-05 NOTE — TOC Transition Note (Signed)
Transition of Care [facility]) - CM/SW Discharge Note   Patient Details  Name: STANCIL DEISHER MRN: [id number] Date of Birth: November 30, 1940  Transition of Care [facility]) CM/SW Contact:  Bary Castilla, LCSW Phone Number:[phone] 04/05/2021, 12:24 PM   Clinical Narrative:    Patient will DC to:?[place] date:?04/05/2021 Family notified:?Sandra Transport by: Corey Harold   Per MD patient ready for DC to [facility] RN, patient, patient's family, and facility notified of DC. Discharge Summary sent to facility. RN given number for report   [phone] room 106. DC packet on chart. Ambulance transport requested for patient.   CSW signing off.   [place], [place] [phone]    Final next level of care: Skilled Nursing Facility Barriers to Discharge: Barriers Resolved   Patient Goals and CMS Choice   CMS Medicare.gov Compare Post Acute Care list provided to:: Patient Represenative (must comment) (Spoke with patients Grandaughter [name]) Choice offered to / list presented to :  (Spoke with patients Claudette Stapler)  Discharge Placement              Patient chooses bed at: [facility] Patient to be transferred to facility by: [place] Name of family member notified: [name] Patient and family notified of of transfer: 04/05/21  Discharge Plan and Services In-house Referral: Clinical Social Work                                   Social Determinants of Health ([place]) Interventions     Readmission Risk Interventions Readmission Risk Prevention Plan 02/17/2021  Transportation Screening Complete  Medication Review Press photographer) Complete  PCP or Specialist appointment within 3-5 days of discharge Complete  HRI or [place] Complete  SW Recovery Care/Counseling Consult Complete  [place] Not Applicable

## 2021-04-05 NOTE — Progress Notes (Signed)
Pt discharged from 4E to [facility] via [facility]. AVS provided to [facility]. IV and telemetry removed.   Raelyn Number, RN

## 2021-04-05 NOTE — Discharge Summary (Signed)
Physician Discharge Summary  Todd Dunn YWV:[id number] DOB: [birth year] DOA: 03/20/2021  PCP: Fredrich Romans, PA  Admit date: 03/20/2021  Discharge date: 04/05/2021  Admitted From: Home.  Disposition: SNF  Recommendations for Outpatient Follow-up:  Follow up with PCP in 1-2 weeks. Please obtain BMP/CBC in one week. Advised to follow-up with orthopedics Dr. Sharol Given in 1 week. Patient is being discharged to skilled nursing facility with wound VAC.  Home Health: None Equipment/Devices: Wound Vac  Discharge Condition: Stable. CODE STATUS:Full code Diet recommendation: Heart Healthy  Brief Summary / Hospital Course: This 80 y.o. male with PMH significant of CAD/CABG, PPM for SSS, ICM, CVA with residual left hemiplegia, dysphagia, and dysarthria, neurogenic bladder with chronic Foley, recurrent UTIs who lives at home with his granddaughter. Patient was brought to the ED on 10/14 with 2 weeks history of progressive right great toe infection without any antecedent trauma or ulcer.  X-ray right foot concerning for osteomyelitis of the lateral toe.  Patient was admitted for dry gangrene of the right great toe.  Patient was seen by vascular surgery and orthopedics.  Patient was started on IV antibiotics (cefepime and linezolid). Patient underwent first ray amputation.  Tolerated well.  Patient remained afebrile,  white cell count was normal,  lactic acid normal wound cultures growing staph aureus,  as per orthopedics after surgery Patient discontinued on antibiotics.  Patient underwent angiography before undergoing ray amputation.  Patient does have waxing and waning in mental status which has improved and Patient is back to baseline.  Patient is cleared from Ortho to be discharged to skilled nursing facility and follow-up in 1 week for wound VAC removal. Patient is being discharged to skilled nursing facility for rehab.  He was managed for below problems.   Discharge Diagnoses:  Principal  Problem:   Wound infection Active Problems:   Catheter-associated urinary tract infection (HCC)   Chronic indwelling Foley catheter   HTN (hypertension)   Osteomyelitis of great toe of right foot (HCC)   Pacemaker   Elevated alkaline phosphatase level   Hyperlipidemia   Toe infection   PVD (peripheral vascular disease) (HCC)   Protein-calorie malnutrition, severe  Dry gangrene of the right great toe: Suspected osteomyelitis of right foot lateral tuft: Patient was evaluated by orthopedics and vascular surgery. WBC normal, lactic acid normal,  remains afebrile. Wound cultures growing staph aureus Patient is started on broad-spectrum antibiotics. Antibiotics discontinued on 10/21 per discussion with orthopedics. Patient underwent right foot first ray amputation 10/26. Right foot wrapped with wound VAC.   Critical limb ischemia of right lower extremity: Peripheral artery disease Patient underwent aortogram and right lower extremity angiography  on 10/17 Per report, heavily calcified aorta with patent renal arteries, severely decreased flow, occluded right SFA.  Patient underwent angioplasty of the right posterior tibial artery and bypass of the right common femoral artery to posterior tibial artery with prosthetic graft on 10/20 Continue Plavix, statin after his CVA. Patient is cleared from vascular surgery with improved inflow to heal the amputation site. Vascular surgery recommended follow-up in 4 weeks with ABI and duplex of bypass.   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 Continue Imdur, hydralazine and amlodipine. Lasix and lisinopril are on hold. Hemodynamically stable.   CAD/CABG in 2014: Denies any chest pain. Continue Plavix, Imdur, statin   History of SSS s/p pacemaker Currently paced rhythm.   History of CVA with residual left hemiplegia: Continue statins and Plavix Patient is wheelchair-bound at baseline.  UTI related to  chronic Foley: Neurogenic bladder /chronic indwelling Foley. Urine culture grew 70K of Proteus mirabilis Foley changed on 10/14 in the ED Continue Proscar and Flomax   Acute metabolic encephalopathy: Patient has intermittent restlessness, agitation, currently stable. Per granddaughter, patient tends to have these episodes when he gets UTI.   She states that his stroke was several years ago and did not leave any residual cognitive deficit or change in mental status. Continue to monitor mental status change.   Normocytic normochromic anemia: No evidence of bleeding at this point. Hemoglobin remained stable.  Continue to monitor   Depression/anxiety: Continue Celexa  Discharge Instructions  Discharge Instructions     Call MD for:  difficulty breathing, headache or visual disturbances   Complete by: As directed    Call MD for:  persistant dizziness or light-headedness   Complete by: As directed    Call MD for:  persistant nausea and vomiting   Complete by: As directed    Call MD for:  severe uncontrolled pain   Complete by: As directed    Diet - low sodium heart healthy   Complete by: As directed    Diet - low sodium heart healthy   Complete by: As directed    Discharge instructions   Complete by: As directed    Advised to follow-up with primary care physician in 1 week. Advised to follow-up with orthopedics Dr. Sharol Given in 1 week.   Discharge wound care:   Complete by: As directed    F/u Ortho in 1 week   Discharge wound care:   Complete by: As directed    Follow-up orthopedics.   Increase activity slowly   Complete by: As directed    Increase activity slowly   Complete by: As directed       Allergies as of 04/05/2021       Reactions   Aspirin Itching   [place] [employer] (m. Mercenaria) Skin Test Itching   Penicillins Itching        Medication List     STOP taking these medications    lisinopril 40 MG tablet Commonly known as: ZESTRIL       TAKE  these medications    acetaminophen 325 MG tablet Commonly known as: TYLENOL Take 650 mg by mouth every 4 (four) hours as needed for mild pain.   amLODipine 10 MG tablet Commonly known as: NORVASC Take 10 mg by mouth daily.   bisacodyl 5 MG EC tablet Commonly known as: DULCOLAX Take 1 tablet (5 mg total) by mouth daily as needed for moderate constipation.   citalopram 20 MG tablet Commonly known as: CELEXA Take 20 mg by mouth at bedtime.   clopidogrel 75 MG tablet Commonly known as: PLAVIX Take 75 mg by mouth at bedtime.   docusate sodium 100 MG capsule Commonly known as: COLACE Take 1 capsule (100 mg total) by mouth every 12 (twelve) hours.   finasteride 5 MG tablet Commonly known as: PROSCAR Take 5 mg by mouth daily.   furosemide 20 MG tablet Commonly known as: LASIX Take 20 mg by mouth daily.   hydrALAZINE 50 MG tablet Commonly known as: APRESOLINE Take 50 mg by mouth 2 (two) times daily.   HYDROcodone-acetaminophen 5-325 MG tablet Commonly known as: NORCO/VICODIN Take 1-2 tablets by mouth every 4 (four) hours as needed for moderate pain (pain score 4-6).   isosorbide mononitrate 60 MG 24 hr tablet Commonly known as: IMDUR Take 60 mg by mouth daily.  nitroGLYCERIN 0.4 MG SL tablet Commonly known as: NITROSTAT Place 0.4 mg under the tongue every 5 (five) minutes as needed for chest pain.   pantoprazole 40 MG tablet Commonly known as: PROTONIX Take 1 tablet (40 mg total) by mouth daily. Start taking on: April 06, 2021   rosuvastatin 10 MG tablet Commonly known as: CRESTOR Take 10 mg by mouth daily.   tamsulosin 0.4 MG Caps capsule Commonly known as: FLOMAX Take 0.4 mg by mouth daily.   vitamin B-12 1000 MCG tablet Commonly known as: CYANOCOBALAMIN Take 1 tablet (1,000 mcg total) by mouth daily.   Vitamin D (Ergocalciferol) 1.25 MG (50000 UNIT) Caps capsule Commonly known as: DRISDOL Take 50,000 Units by mouth every Monday.                Discharge Care Instructions  (From admission, onward)           Start     Ordered   04/05/21 0000  Discharge wound care:       Comments: F/u Ortho in 1 week   04/05/21 0902   04/05/21 0000  Discharge wound care:       Comments: Follow-up orthopedics.   04/05/21 1029            Follow-up Information     Newt Minion, MD Follow up in 1 week(s).   Specialty: Orthopedic Surgery Contact information: [address] [place] [address] [phone]         [facility] Follow up in 4 week(s).   Why: The office will call the patient with an appointment Contact information: [place], [place] Follow up in 1 week(s).   Specialty: Physician Assistant Contact information: [place] [address] [phone]         Jettie Booze, MD .   Specialties: Cardiology, Radiology, Interventional Cardiology Contact information: [address] [place] [address] [phone]                Allergies  Allergen Reactions   Aspirin Itching   [place] [employer] (M. Mercenaria) Skin Test Itching   Penicillins Itching    Consultations: Vascular surgery Orthopedics.   Procedures/Studies: PERIPHERAL VASCULAR CATHETERIZATION  Result Date: 03/23/2021 Images from the original result were not included. Patient name: Todd Dunn MRN: [id number] DOB: [birth year] Sex: male 03/23/2021 Pre-operative Diagnosis: Chronic right lower extremity limb threatening ischemia Post-operative diagnosis:  Same Surgeon:  Eda Paschal. Donzetta Matters, MD Procedure Performed: 1.  Ultrasound-guided cannulation left common femoral artery 2.  Aortogram 3.  Selection of right common femoral artery and right lower extremity angiography 4.  Mynx device closure left common femoral artery Indications: 81 year old male with right great toe ulceration.  He has decreased  ABIs.  He is indicated for angiography with possible intervention. Findings: The aorta was heavily calcified renal arteries were patent.  There was severely decreased flow given likely low ejection fraction.  Right hypogastric artery was patent left hypogastric artery was occluded.  Given patient's confusion and combativeness on the table we were only able to evaluate the right lower extremity we selected the right common femoral artery.  The SFA is occluded just after the takeoff for approximately 10 cm and reconstitutes down to the level of the above-knee popliteal artery was then occluded at the level of the knee and below.  He reconstitutes the posterior tibial artery at its  takeoff it appears to run to the level of the foot. Patient will be considered for right femoral to posterior tibial artery bypass.  Vein mapping is ordered. Patient was combative throughout the procedure  Procedure:  The patient was identified in the holding area and taken to room 8.  The patient was then placed supine on the table and prepped and draped in the usual sterile fashion.  A time out was called.  Ultrasound was used to evaluate the left common femoral artery.  There was a little bit of calcification posteriorly.  We did find an area anteriorly that was soft and amenable for cannulation.  There is no signs 1% lidocaine cannulated micropuncture needle followed by wire and sheath.  Bentson wires placed followed by 5 [place] sheath.  We then placed a pigtail catheter to the level of L1 performed aortogram followed by pelvic angiogram.  We elected to cross the bifurcation performed right lower extremity angiography.  This was very difficult with the patient's combativeness we were able to identify a posterior tibial artery as his runoff.  Patient will be considered for bypass.  We removed the catheter over wire we deployed a minx device.  He was combative but the procedure overall had no complications. Contrast: 120 cc Brandon C.  Donzetta Matters, MD [facility] Office: [phone] Pager: [phone]   DG CHEST PORT 1 VIEW  Result Date: 03/22/2021 CLINICAL DATA:  Left-sided chest pain for 2 days EXAM: PORTABLE CHEST 1 VIEW COMPARISON:  02/08/2021 FINDINGS: Pacer/AICD device with leads at right atrium and right ventricle. Prior median sternotomy. Midline trachea. Mild cardiomegaly. Atherosclerosis in the transverse aorta. No pleural effusion or pneumothorax. Skin fold projects over the lateral right hemithorax. No congestive failure. Clear lungs. IMPRESSION: No acute cardiopulmonary disease. Aortic Atherosclerosis (ICD10-I70.0). Electronically Signed   By: Abigail Miyamoto M.D.   On: 03/22/2021 13:48   DG Toe Great Right  Result Date: 03/20/2021 CLINICAL DATA:  Toe infection EXAM: RIGHT GREAT TOE COMPARISON:  None. FINDINGS: Mild focal osseous irregularity of the lateral tuft. No evidence of fracture or dislocation. Soft tissue swelling of the foot. Partially detached great toe toenail. IMPRESSION: Mild focal osseous irregularity of the lateral tuft, which is concerning for osteomyelitis. Finding could be further evaluated with MRI. Electronically Signed   By: Yetta Glassman M.D.   On: 03/20/2021 13:16   VAS [place] ABI WITH/WO TBI  Result Date: 03/21/2021  LOWER EXTREMITY DOPPLER STUDY Patient Name:  BURLE KWAN  Date of Exam:   03/21/2021 Medical Rec #: [id number]           Accession #:    [id number] Date of Birth: 11/22/1940           Patient Gender: M Patient Age:   42 years Exam Location:  [facility] Procedure:      VAS [place] ABI WITH/WO TBI Referring Phys: Fuller Plan --------------------------------------------------------------------------------  Indications: Ulceration. High Risk Factors: Hypertension, current smoker, prior CVA. Other Factors: Sick sinus syndrome, pacemaker. Residual left hemiparesis,                dysphagia, and dysarthria from prior CVA.  Performing Technologist: Sharion Dove RVS  Examination Guidelines: A complete evaluation includes at minimum, Doppler waveform signals and systolic blood pressure reading at the level of bilateral brachial, anterior tibial, and posterior tibial arteries, when vessel segments are accessible. Bilateral testing is considered an integral part of a complete examination. Photoelectric Plethysmograph (PPG) waveforms and toe systolic  pressure readings are included as required and additional duplex testing as needed. Limited examinations for reoccurring indications may be performed as noted.  ABI Findings: +---------+------------------+-----+-------------------+--------+ Right    Rt Pressure (mmHg)IndexWaveform           Comment  +---------+------------------+-----+-------------------+--------+ Brachial 143                    multiphasic                 +---------+------------------+-----+-------------------+--------+ PTA      52                0.36 dampened monophasic         +---------+------------------+-----+-------------------+--------+ DP                              absent                      +---------+------------------+-----+-------------------+--------+ Great Toe11                0.08                             +---------+------------------+-----+-------------------+--------+ +---------+------------------+-----+-----------+-------+ Left     Lt Pressure (mmHg)IndexWaveform   Comment +---------+------------------+-----+-----------+-------+ Brachial 136                    multiphasic        +---------+------------------+-----+-----------+-------+ PTA      97                0.68 monophasic         +---------+------------------+-----+-----------+-------+ DP       72                0.50 monophasic         +---------+------------------+-----+-----------+-------+ Great Toe40                0.28                    +---------+------------------+-----+-----------+-------+  +-------+-----------+-----------+------------+------------+ ABI/TBIToday's ABIToday's TBIPrevious ABIPrevious TBI +-------+-----------+-----------+------------+------------+ Right  0.36       0.08                                +-------+-----------+-----------+------------+------------+ Left   0.68       0.28                                +-------+-----------+-----------+------------+------------+  Summary: Right: Resting right ankle-brachial index indicates severe right lower extremity arterial disease. The right toe-brachial index is abnormal. Left: Resting left ankle-brachial index indicates moderate left lower extremity arterial disease. The left toe-brachial index is abnormal.  *See table(s) above for measurements and observations.  Electronically signed by Monica Martinez MD on 03/21/2021 at 10:41:09 AM.    Final    VAS [place] LOWER EXTREMITY SAPHENOUS VEIN MAPPING  Result Date: 03/24/2021 LOWER EXTREMITY VEIN MAPPING Patient Name:  NADAV SWINDELL  Date of Exam:   03/24/2021 Medical Rec #: [id number]           Accession #:    [id number] Date of Birth: 04-19-1941           Patient Gender: M Patient Age:   39 years Exam Location:  [facility]  [place] Procedure:      VAS [place] LOWER EXTREMITY SAPHENOUS VEIN MAPPING Referring Phys: [name] --------------------------------------------------------------------------------  Indications: PAD  Performing Technologist: Archie Patten RVS  Examination Guidelines: A complete evaluation includes B-mode imaging, spectral Doppler, color Doppler, and power Doppler as needed of all accessible portions of each vessel. Bilateral testing is considered an integral part of a complete examination. Limited examinations for reoccurring indications may be performed as noted. +------------+-------------+---------------------+-----------+-----------------+ RT Diameter  RT Findings          GSV         LT Diameter   LT Findings        (cm)                                          (cm)                      +------------+-------------+---------------------+-----------+-----------------+                  not        Saphenofemoral       0.71        branching                  visualized        Junction                                    +------------+-------------+---------------------+-----------+-----------------+                  not        Proximal thigh               branching and not              visualized                                     visualized     +------------+-------------+---------------------+-----------+-----------------+                  not           Mid thigh                  not visualized                visualized                                                    +------------+-------------+---------------------+-----------+-----------------+     0.19                     Distal thigh                 not visualized   +------------+-------------+---------------------+-----------+-----------------+                  not             Knee                     not visualized  visualized                                                    +------------+-------------+---------------------+-----------+-----------------+     0.19                       Prox calf                  not visualized   +------------+-------------+---------------------+-----------+-----------------+     0.29      branching        Mid calf                   not visualized   +------------+-------------+---------------------+-----------+-----------------+     0.16                      Distal calf                 not visualized   +------------+-------------+---------------------+-----------+-----------------+     0.15                         Ankle                    not visualized   +------------+-------------+---------------------+-----------+-----------------+  +----------------+--------------+--------------+----------------+--------------+ RT diameter (cm) RT Findings       SSV      LT Diameter (cm) LT Findings   +----------------+--------------+--------------+----------------+--------------+                 not visualized  Popliteal                   not visualized                                   fossa                                    +----------------+--------------+--------------+----------------+--------------+                 not visualizedProximal calf                 not visualized +----------------+--------------+--------------+----------------+--------------+                 not visualized   Mid calf                   not visualized +----------------+--------------+--------------+----------------+--------------+                 not visualized Distal calf                  not visualized +----------------+--------------+--------------+----------------+--------------+ Diagnosing physician: Deitra Mayo MD Electronically signed by Deitra Mayo MD on 03/24/2021 at 12:56:19 PM.    Final    ECHOCARDIOGRAM COMPLETE  Result Date: 03/23/2021    ECHOCARDIOGRAM REPORT   Patient Name:   Todd Dunn Date of Exam: 03/23/2021 Medical Rec #:  [id number]          Height:       73.0 in Accession #:    [id number]         Weight:  137.1 lb Date of Birth:  1940-06-26          BSA:          1.833 m Patient Age:    81 years           BP:           149/64 mmHg Patient Gender: M                  HR:           74 bpm. Exam Location:  Inpatient Procedure: 2D Echo, Cardiac Doppler and Color Doppler Indications:     Chest Pain  History:         Patient has prior history of Echocardiogram examinations. Risk                  Factors:Family History of Coronary Artery Disease and                  Hypertension.  Sonographer:     Dub Mikes Sonographer#2:   Helmut Muster Referring Phys:  [id number] YSAY XU Diagnosing Phys: Candee Furbish MD  Sonographer Comments: Image acquisition challenging due to patient behavioral factors. and Image acquisition challenging due to uncooperative patient. IMPRESSIONS  1. Left ventricular ejection fraction, by estimation, is 30 to 35%. The left ventricle has moderately decreased function. The left ventricle demonstrates global hypokinesis. The left ventricular internal cavity size was mildly dilated. Left ventricular diastolic parameters are consistent with Grade I diastolic dysfunction (impaired relaxation).  2. Right ventricular systolic function is normal. The right ventricular size is normal. There is mildly elevated pulmonary artery systolic pressure. The estimated right ventricular systolic pressure is 30.1 mmHg.  3. Left atrial size was moderately dilated.  4. Right atrial size was moderately dilated.  5. The mitral valve is normal in structure. Trivial mitral valve regurgitation. No evidence of mitral stenosis.  6. Tricuspid valve regurgitation is moderate.  7. The aortic valve is tricuspid. Aortic valve regurgitation is not visualized. Mild to moderate aortic valve sclerosis/calcification is present, without any evidence of aortic stenosis.  8. The inferior vena cava is dilated in size with >50% respiratory variability, suggesting right atrial pressure of 8 mmHg. FINDINGS  Left Ventricle: Left ventricular ejection fraction, by estimation, is 30 to 35%. The left ventricle has moderately decreased function. The left ventricle demonstrates global hypokinesis. The left ventricular internal cavity size was mildly dilated. There is no left ventricular hypertrophy. Left ventricular diastolic parameters are consistent with Grade I diastolic dysfunction (impaired relaxation). Right Ventricle: The right ventricular size is normal. No increase in right ventricular wall thickness. Right ventricular systolic function is normal. There is mildly elevated pulmonary artery systolic pressure. The tricuspid regurgitant  velocity is 2.88  m/s, and with an assumed right atrial pressure of 8 mmHg, the estimated right ventricular systolic pressure is 60.1 mmHg. Left Atrium: Left atrial size was moderately dilated. Right Atrium: Right atrial size was moderately dilated. Pericardium: There is no evidence of pericardial effusion. Mitral Valve: The mitral valve is normal in structure. Trivial mitral valve regurgitation. No evidence of mitral valve stenosis. Tricuspid Valve: The tricuspid valve is normal in structure. Tricuspid valve regurgitation is moderate . No evidence of tricuspid stenosis. Aortic Valve: The aortic valve is tricuspid. Aortic valve regurgitation is not visualized. Mild to moderate aortic valve sclerosis/calcification is present, without any evidence of aortic stenosis. Pulmonic Valve: The pulmonic valve was normal in structure. Pulmonic valve regurgitation is not visualized. No evidence  of pulmonic stenosis. Aorta: The aortic root is normal in size and structure. Venous: The inferior vena cava is dilated in size with greater than 50% respiratory variability, suggesting right atrial pressure of 8 mmHg. IAS/Shunts: No atrial level shunt detected by color flow Doppler. Additional Comments: A device lead is visualized in the right ventricle.  LEFT VENTRICLE PLAX 2D LVIDd:         5.50 cm      Diastology LVIDs:         4.40 cm      LV e' medial:    4.79 cm/s LV PW:         1.30 cm      LV E/e' medial:  14.5 LV IVS:        1.30 cm      LV e' lateral:   10.26 cm/s LVOT diam:     2.20 cm      LV E/e' lateral: 6.8 LV SV:         97 LV SV Index:   53 LVOT Area:     3.80 cm  LV Volumes (MOD) LV vol d, MOD A2C: 103.9 ml LV vol d, MOD A4C: 150.0 ml LV vol s, MOD A2C: 64.6 ml LV vol s, MOD A4C: 95.7 ml LV SV MOD A2C:     39.3 ml LV SV MOD A4C:     150.0 ml LV SV MOD BP:      47.1 ml RIGHT VENTRICLE             IVC RV S prime:     12.20 cm/s  IVC diam: 2.20 cm TAPSE (M-mode): 2.2 cm LEFT ATRIUM             Index        RIGHT ATRIUM            Index LA diam:        4.00 cm 2.18 cm/m   RA Area:     18.00 cm LA Vol (A2C):   80.9 ml 44.14 ml/m  RA Volume:   50.20 ml  27.39 ml/m LA Vol (A4C):   88.0 ml 48.01 ml/m LA Biplane Vol: 82.9 ml 45.23 ml/m  AORTIC VALVE LVOT Vmax:   128.00 cm/s LVOT Vmean:  79.800 cm/s LVOT VTI:    0.255 m  AORTA Ao Root diam: 3.60 cm Ao Asc diam:  3.70 cm MITRAL VALVE               TRICUSPID VALVE MV Area (PHT): 1.79 cm    TR Peak grad:   33.2 mmHg MV Decel Time: 423 msec    TR Vmax:        288.00 cm/s MV E velocity: 69.40 cm/s MV A velocity: 77.60 cm/s  SHUNTS MV E/A ratio:  0.89        Systemic VTI:  0.26 m                            Systemic Diam: 2.20 cm Candee Furbish MD Electronically signed by Candee Furbish MD Signature Date/Time: 03/23/2021/4:53:31 PM    Final    Angiography and angioplasty.  First ray amputation.  Subjective: Patient was seen and examined at bedside.  Alert and oriented.  Patient is back to his baseline mental status.   Patient feels better and Patient is being discharged to skilled nursing facility for rehab  Discharge Exam: Vitals:   04/05/21 0449 04/05/21 0908  BP: (!) 128/48 125/90  Pulse:  60  Resp: 19 16  Temp: 97.7 F (36.5 C) 98.1 F (36.7 C)  SpO2:  100%   Vitals:   04/04/21 2311 04/05/21 0203 04/05/21 0449 04/05/21 0908  BP: 123/76 135/73 (!) 128/48 125/90  Pulse: 73 97  60  Resp: 19 19 19 16   Temp: 98.4 F (36.9 C) 98.5 F (36.9 C) 97.7 F (36.5 C) 98.1 F (36.7 C)  TempSrc: Oral Oral Oral Oral  SpO2: 100% 98%  100%  Weight:      Height:        General: Pt is alert, awake, not in acute distress Cardiovascular: RRR, S1/S2 +, no rubs, no gallops Respiratory: CTA bilaterally, no wheezing, no rhonchi Abdominal: Soft, NT, ND, bowel sounds + Extremities: Right great toe amputation.  Wound VAC noted    The results of significant diagnostics from this hospitalization (including imaging, microbiology, ancillary and laboratory) are listed below for  reference.     Microbiology: Recent Results (from the past 240 hour(s))  Surgical PCR screen     Status: None   Collection Time: 03/31/21 10:44 PM   Specimen: Nasal Mucosa; Nasal Swab  Result Value Ref Range Status   MRSA, PCR NEGATIVE NEGATIVE Final   Staphylococcus aureus NEGATIVE NEGATIVE Final    Comment: (NOTE) The Xpert SA Assay (FDA approved for NASAL specimens in patients 40 years of age and older), is one component of a comprehensive surveillance program. It is not intended to diagnose infection nor to guide or monitor treatment. Performed at [facility], [place] [address] [place], [place] [address]   Resp Panel by RT-PCR (Flu A&B, Covid) Nasopharyngeal Swab     Status: None   Collection Time: 04/04/21  4:59 PM   Specimen: Nasopharyngeal Swab; Nasopharyngeal(NP) swabs in vial transport medium  Result Value Ref Range Status   SARS Coronavirus 2 by RT PCR NEGATIVE NEGATIVE Final    Comment: (NOTE) SARS-CoV-2 target nucleic acids are NOT DETECTED.  The SARS-CoV-2 RNA is generally detectable in upper respiratory specimens during the acute phase of infection. The lowest concentration of SARS-CoV-2 viral copies this assay can detect is 138 copies/mL. A negative result does not preclude SARS-Cov-2 infection and should not be used as the sole basis for treatment or other patient management decisions. A negative result may occur with  improper specimen collection/handling, submission of specimen other than nasopharyngeal swab, presence of viral mutation(s) within the areas targeted by this assay, and inadequate number of viral copies(<138 copies/mL). A negative result must be combined with clinical observations, patient history, and epidemiological information. The expected result is Negative.  Fact Sheet for Patients:  EntrepreneurPulse.com.au  Fact Sheet for Healthcare Providers:  IncredibleEmployment.be  This test is no t yet  approved or cleared by the [place] FDA and  has been authorized for detection and/or diagnosis of SARS-CoV-2 by FDA under an Emergency Use Authorization (EUA). This EUA will remain  in effect (meaning this test can be used) for the duration of the COVID-19 declaration under Section 564(b)(1) of the Act, 21 U.S.C.section 360bbb-3(b)(1), unless the authorization is terminated  or revoked sooner.       Influenza A by PCR NEGATIVE NEGATIVE Final   Influenza B by PCR NEGATIVE NEGATIVE Final    Comment: (NOTE) The Xpert Xpress SARS-CoV-2/FLU/RSV plus assay is intended as an aid in the diagnosis of influenza from Nasopharyngeal swab specimens and should not be used as a sole basis for treatment. Nasal washings  and aspirates are unacceptable for Xpert Xpress SARS-CoV-2/FLU/RSV testing.  Fact Sheet for Patients: EntrepreneurPulse.com.au  Fact Sheet for Healthcare Providers: IncredibleEmployment.be  This test is not yet approved or cleared by the [place] FDA and has been authorized for detection and/or diagnosis of SARS-CoV-2 by FDA under an Emergency Use Authorization (EUA). This EUA will remain in effect (meaning this test can be used) for the duration of the COVID-19 declaration under Section 564(b)(1) of the Act, 21 U.S.C. section 360bbb-3(b)(1), unless the authorization is terminated or revoked.  Performed at [facility], [place] [address] [place], [place] [address]      Labs: BNP (last 3 results) No results for input(s): BNP in the last 8760 hours. Basic Metabolic Panel: Recent Labs  Lab 03/31/21 0207 04/01/21 0210 04/02/21 0146 04/03/21 0152 04/04/21 0115  NA 138 139 135 137 134*  K 3.9 4.0 4.1 4.2 4.5  CL 106 106 102 104 103  CO2 26 29 26 28 27   GLUCOSE 101* 123* 102* 111* 124*  BUN 18 13 14 18 21   CREATININE 0.72 0.68 0.64 0.72 0.71  CALCIUM 9.5 9.7 9.4 9.7 9.6  MG  --   --  1.9  --   --   PHOS  --   --  2.3*   --   --    Liver Function Tests: No results for input(s): AST, ALT, ALKPHOS, BILITOT, PROT, ALBUMIN in the last 168 hours. No results for input(s): LIPASE, AMYLASE in the last 168 hours. No results for input(s): AMMONIA in the last 168 hours. CBC: Recent Labs  Lab 03/30/21 0606 03/31/21 0207 04/01/21 0210  WBC 3.9* 3.9* 4.1  NEUTROABS 1.9 2.1 2.4  HGB 7.8* 8.2* 8.0*  HCT 24.8* 27.0* 25.7*  MCV 80.8 82.8 80.8  PLT 179 216 236   Cardiac Enzymes: No results for input(s): CKTOTAL, CKMB, CKMBINDEX, TROPONINI in the last 168 hours. BNP: Invalid input(s): POCBNP CBG: No results for input(s): GLUCAP in the last 168 hours. D-Dimer No results for input(s): DDIMER in the last 72 hours. Hgb A1c No results for input(s): HGBA1C in the last 72 hours. Lipid Profile No results for input(s): CHOL, HDL, LDLCALC, TRIG, CHOLHDL, LDLDIRECT in the last 72 hours. Thyroid function studies No results for input(s): TSH, T4TOTAL, T3FREE, THYROIDAB in the last 72 hours.  Invalid input(s): FREET3 Anemia work up No results for input(s): VITAMINB12, FOLATE, FERRITIN, TIBC, IRON, RETICCTPCT in the last 72 hours. Urinalysis    Component Value Date/Time   COLORURINE YELLOW 03/20/2021 0901   APPEARANCEUR CLOUDY (A) 03/20/2021 0901   LABSPEC 1.012 03/20/2021 0901   PHURINE 7.0 03/20/2021 0901   GLUCOSEU NEGATIVE 03/20/2021 0901   HGBUR SMALL (A) 03/20/2021 0901   BILIRUBINUR NEGATIVE 03/20/2021 0901   KETONESUR NEGATIVE 03/20/2021 0901   PROTEINUR NEGATIVE 03/20/2021 0901   NITRITE NEGATIVE 03/20/2021 0901   LEUKOCYTESUR LARGE (A) 03/20/2021 0901   Sepsis Labs Invalid input(s): PROCALCITONIN,  WBC,  LACTICIDVEN Microbiology Recent Results (from the past 240 hour(s))  Surgical PCR screen     Status: None   Collection Time: 03/31/21 10:44 PM   Specimen: Nasal Mucosa; Nasal Swab  Result Value Ref Range Status   MRSA, PCR NEGATIVE NEGATIVE Final   Staphylococcus aureus NEGATIVE NEGATIVE Final     Comment: (NOTE) The Xpert SA Assay (FDA approved for NASAL specimens in patients 13 years of age and older), is one component of a comprehensive surveillance program. It is not intended to diagnose infection nor to guide  or monitor treatment. Performed at [facility], [place] [address] [place], [place] [address]   Resp Panel by RT-PCR (Flu A&B, Covid) Nasopharyngeal Swab     Status: None   Collection Time: 04/04/21  4:59 PM   Specimen: Nasopharyngeal Swab; Nasopharyngeal(NP) swabs in vial transport medium  Result Value Ref Range Status   SARS Coronavirus 2 by RT PCR NEGATIVE NEGATIVE Final    Comment: (NOTE) SARS-CoV-2 target nucleic acids are NOT DETECTED.  The SARS-CoV-2 RNA is generally detectable in upper respiratory specimens during the acute phase of infection. The lowest concentration of SARS-CoV-2 viral copies this assay can detect is 138 copies/mL. A negative result does not preclude SARS-Cov-2 infection and should not be used as the sole basis for treatment or other patient management decisions. A negative result may occur with  improper specimen collection/handling, submission of specimen other than nasopharyngeal swab, presence of viral mutation(s) within the areas targeted by this assay, and inadequate number of viral copies(<138 copies/mL). A negative result must be combined with clinical observations, patient history, and epidemiological information. The expected result is Negative.  Fact Sheet for Patients:  EntrepreneurPulse.com.au  Fact Sheet for Healthcare Providers:  IncredibleEmployment.be  This test is no t yet approved or cleared by the [place] FDA and  has been authorized for detection and/or diagnosis of SARS-CoV-2 by FDA under an Emergency Use Authorization (EUA). This EUA will remain  in effect (meaning this test can be used) for the duration of the COVID-19 declaration under Section 564(b)(1) of the Act,  21 U.S.C.section 360bbb-3(b)(1), unless the authorization is terminated  or revoked sooner.       Influenza A by PCR NEGATIVE NEGATIVE Final   Influenza B by PCR NEGATIVE NEGATIVE Final    Comment: (NOTE) The Xpert Xpress SARS-CoV-2/FLU/RSV plus assay is intended as an aid in the diagnosis of influenza from Nasopharyngeal swab specimens and should not be used as a sole basis for treatment. Nasal washings and aspirates are unacceptable for Xpert Xpress SARS-CoV-2/FLU/RSV testing.  Fact Sheet for Patients: EntrepreneurPulse.com.au  Fact Sheet for Healthcare Providers: IncredibleEmployment.be  This test is not yet approved or cleared by the [place] FDA and has been authorized for detection and/or diagnosis of SARS-CoV-2 by FDA under an Emergency Use Authorization (EUA). This EUA will remain in effect (meaning this test can be used) for the duration of the COVID-19 declaration under Section 564(b)(1) of the Act, 21 U.S.C. section 360bbb-3(b)(1), unless the authorization is terminated or revoked.  Performed at [facility], [place] [address] [place], [place] [address]      Time coordinating discharge: Over 30 minutes  SIGNED:   Shawna Clamp, MD  [facility] 04/05/2021, 12:09 PM Pager   If 7PM-7AM, please contact night-coverage

## 2021-04-06 ENCOUNTER — Telehealth: Payer: Self-pay

## 2021-04-06 ENCOUNTER — Encounter (HOSPITAL_COMMUNITY): Payer: Self-pay

## 2021-04-06 ENCOUNTER — Emergency Department (HOSPITAL_COMMUNITY)
Admission: EM | Admit: 2021-04-06 | Discharge: 2021-04-06 | Disposition: A | Discharge status: Skilled Nursing Facility | Payer: Medicare Other | Attending: Emergency Medicine | Admitting: Emergency Medicine

## 2021-04-06 ENCOUNTER — Emergency Department (HOSPITAL_COMMUNITY): Payer: Medicare Other

## 2021-04-06 ENCOUNTER — Other Ambulatory Visit: Payer: Self-pay

## 2021-04-06 ENCOUNTER — Encounter (HOSPITAL_COMMUNITY): Payer: Self-pay | Admitting: Orthopedic Surgery

## 2021-04-06 DIAGNOSIS — Y9289 Other specified places as the place of occurrence of the external cause: Secondary | ICD-10-CM | POA: Insufficient documentation

## 2021-04-06 DIAGNOSIS — W1809XA Striking against other object with subsequent fall, initial encounter: Secondary | ICD-10-CM | POA: Diagnosis not present

## 2021-04-06 DIAGNOSIS — S0083XA Contusion of other part of head, initial encounter: Secondary | ICD-10-CM | POA: Diagnosis not present

## 2021-04-06 DIAGNOSIS — F0390 Unspecified dementia without behavioral disturbance: Secondary | ICD-10-CM | POA: Diagnosis not present

## 2021-04-06 DIAGNOSIS — S0990XA Unspecified injury of head, initial encounter: Secondary | ICD-10-CM | POA: Diagnosis present

## 2021-04-06 LAB — BASIC METABOLIC PANEL
Anion gap: 5 (ref 5–15)
BUN: 14 mg/dL (ref 8–23)
CO2: 30 mmol/L (ref 22–32)
Calcium: 10.3 mg/dL (ref 8.9–10.3)
Chloride: 102 mmol/L (ref 98–111)
Creatinine, Ser: 0.68 mg/dL (ref 0.61–1.24)
GFR, Estimated: >60 mL/min (ref >60)
Glucose, Bld: 141 mg/dL — ABNORMAL HIGH (ref 70–99)
Potassium: 3.9 mmol/L (ref 3.5–5.1)
Sodium: 137 mmol/L (ref 135–145)

## 2021-04-06 LAB — CBC WITH DIFFERENTIAL/PLATELET
Abs Immature Granulocytes: 0.02 10*3/uL (ref 0.00–0.07)
Basophils Absolute: 0 10*3/uL (ref 0.0–0.1)
Basophils Relative: 1 %
Eosinophils Absolute: 0 10*3/uL (ref 0.0–0.5)
Eosinophils Relative: 1 %
HCT: 26.7 % — ABNORMAL LOW (ref 39.0–52.0)
Hemoglobin: 8 g/dL — ABNORMAL LOW (ref 13.0–17.0)
Immature Granulocytes: 0 %
Lymphocytes Relative: 37 %
Lymphs Abs: 1.7 10*3/uL (ref 0.7–4.0)
MCH: 24.6 pg — ABNORMAL LOW (ref 26.0–34.0)
MCHC: 30 g/dL (ref 30.0–36.0)
MCV: 82.2 fL (ref 80.0–100.0)
Monocytes Absolute: 0.4 10*3/uL (ref 0.1–1.0)
Monocytes Relative: 9 %
Neutro Abs: 2.4 10*3/uL (ref 1.7–7.7)
Neutrophils Relative %: 52 %
Platelets: 290 10*3/uL (ref 150–400)
RBC: 3.25 MIL/uL — ABNORMAL LOW (ref 4.22–5.81)
RDW: 16.9 % — ABNORMAL HIGH (ref 11.5–15.5)
WBC: 4.6 10*3/uL (ref 4.0–10.5)
nRBC: 0 % (ref 0.0–0.2)

## 2021-04-06 LAB — I-STAT CHEM 8, ED
BUN: 20 mg/dL (ref 8–23)
Calcium, Ion: 1.41 mmol/L — ABNORMAL HIGH (ref 1.15–1.40)
Chloride: 102 mmol/L (ref 98–111)
Creatinine, Ser: 0.7 mg/dL (ref 0.61–1.24)
Glucose, Bld: 134 mg/dL — ABNORMAL HIGH (ref 70–99)
HCT: 26 % — ABNORMAL LOW (ref 39.0–52.0)
Hemoglobin: 8.8 g/dL — ABNORMAL LOW (ref 13.0–17.0)
Potassium: 4 mmol/L (ref 3.5–5.1)
Sodium: 141 mmol/L (ref 135–145)
TCO2: 31 mmol/L (ref 22–32)

## 2021-04-06 LAB — PROTIME-INR
INR: 1.1 (ref 0.8–1.2)
Prothrombin Time: 14.4 seconds (ref 11.4–15.2)

## 2021-04-06 NOTE — Telephone Encounter (Signed)
Keela with [facility] would like verbal orders to remove wound vac from right foot and what type of dressing to apply on his right foot?  CB# [phone].  Please advise.  Thank you.

## 2021-04-06 NOTE — ED Notes (Signed)
Called [facility] to transport patient to [facility].

## 2021-04-06 NOTE — ED Notes (Signed)
BIB [facility] from [facility]. Pt was in bed and was found by staff crawling on the floor. Hematoma above lt eye. Pt is on Plavix. Initial GCS 11, hx of demential at baseline. Pt has a foley in place and a wound vac to rt ft

## 2021-04-06 NOTE — ED Notes (Signed)
Provider at bedside

## 2021-04-06 NOTE — ED Notes (Addendum)
Trauma Response Nurse Note-  Reason for Call / Reason for Trauma activation:   - Level 2 fall on blood thinners   Interventions:  - Blood work, IV access and CT head and c-spine  Plan of Care as of this note:  - Waiting on results of blood work and imaging  Event Summary:   - Pt came from a facility by EMS. EMS reported that pt was found on the floor besides his bed and takes blood thinners. EMS reported pt was discharged a day ago from the hospital. Pt noted to have a hematoma on the forehead and EMS reported that they/facility are not sure if it is new or old. EMS also reported that pt can be agressive at times.  TRN placed a 20G to the left [facility] (attempts made: 2). Pt back from CT at the time of this note. Primary RN is aware.

## 2021-04-06 NOTE — ED Notes (Signed)
Transported to ct 

## 2021-04-06 NOTE — ED Provider Notes (Signed)
[facility] EMERGENCY DEPARTMENT Provider Note   CSN: [id number] Arrival date & time: 04/06/21  0455     History Chief Complaint  Patient presents with   [name]    Todd Dunn is a 80 y.o. male.  Patient sent to the emergency department for evaluation after a fall.  Patient presents from nursing facility.  He was found on the ground next to his bed.  EMS report that the bed was very low off the ground.  There would have been a minimal fall if he fell out of bed.  Patient cannot provide any further information.  Level 5 caveat due to dementia.      History reviewed. No pertinent past medical history.  There are no problems to display for this patient.        History reviewed. No pertinent family history.     Home Medications Prior to Admission medications   Not on File    Allergies    Patient has no known allergies.  Review of Systems   Review of Systems  Unable to perform ROS: Dementia   Physical Exam Updated Vital Signs BP (!) 131/107 (BP Location: Right Arm)   Pulse 76   Temp 98.1 F (36.7 C) (Oral)   Resp 15   Ht 6' (1.829 m)   Wt 81.6 kg   SpO2 100%   BMI 24.41 kg/m   Physical Exam Vitals and nursing note reviewed.  Constitutional:      General: He is not in acute distress.    Appearance: Normal appearance. He is well-developed.  HENT:     Head: Normocephalic. Contusion present.      Right Ear: Hearing normal.     Left Ear: Hearing normal.     Nose: Nose normal.  Eyes:     Conjunctiva/sclera: Conjunctivae normal.     Pupils: Pupils are equal, round, and reactive to light.  Cardiovascular:     Rate and Rhythm: Regular rhythm.     Heart sounds: S1 normal and S2 normal. No murmur heard.   No friction rub. No gallop.  Pulmonary:     Effort: Pulmonary effort is normal. No respiratory distress.     Breath sounds: Normal breath sounds.  Chest:     Chest wall: No tenderness.  Abdominal:     General: Bowel sounds  are normal.     Palpations: Abdomen is soft.     Tenderness: There is no abdominal tenderness. There is no guarding or rebound. Negative signs include Murphy's sign and McBurney's sign.     Hernia: No hernia is present.  Musculoskeletal:        General: Normal range of motion.     Cervical back: Normal range of motion and neck supple.  Skin:    General: Skin is warm and dry.     Findings: No rash.  Neurological:     Mental Status: He is alert and oriented to person, place, and time.     GCS: GCS eye subscore is 4. GCS verbal subscore is 5. GCS motor subscore is 6.     Cranial Nerves: No cranial nerve deficit.     Sensory: No sensory deficit.     Coordination: Coordination normal.  Psychiatric:        Speech: Speech normal.        Behavior: Behavior normal.        Thought Content: Thought content normal.    ED Results / Procedures / Treatments   Labs (  all labs ordered are listed, but only abnormal results are displayed) Labs Reviewed  CBC WITH DIFFERENTIAL/PLATELET - Abnormal; Notable for the following components:      Result Value   RBC 3.25 (*)    Hemoglobin 8.0 (*)    HCT 26.7 (*)    MCH 24.6 (*)    RDW 16.9 (*)    All other components within normal limits  BASIC METABOLIC PANEL - Abnormal; Notable for the following components:   Glucose, Bld 141 (*)    All other components within normal limits  I-STAT CHEM 8, ED - Abnormal; Notable for the following components:   Glucose, Bld 134 (*)    Calcium, Ion 1.41 (*)    Hemoglobin 8.8 (*)    HCT 26.0 (*)    All other components within normal limits  PROTIME-INR    EKG None  Radiology CT HEAD WO CONTRAST (5MM)  Result Date: 04/06/2021 CLINICAL DATA:  80 year old male with history of trauma after falling out of bed. EXAM: CT HEAD WITHOUT CONTRAST CT CERVICAL SPINE WITHOUT CONTRAST TECHNIQUE: Multidetector CT imaging of the head and cervical spine was performed following the standard protocol without intravenous contrast.  Multiplanar CT image reconstructions of the cervical spine were also generated. COMPARISON:  Head and cervical spine CT scan 12/08/2020. FINDINGS: CT HEAD FINDINGS Brain: Moderate cerebral and mild cerebellar atrophy. Patchy and confluent areas of decreased attenuation are noted throughout the deep and periventricular white matter of the cerebral hemispheres bilaterally, compatible with chronic microvascular ischemic disease. No evidence of acute infarction, hemorrhage, hydrocephalus, extra-axial collection or mass lesion/mass effect. Vascular: No hyperdense vessel or unexpected calcification. Skull: Normal. Negative for fracture or focal lesion. Sinuses/Orbits: No acute finding. Other: None. CT CERVICAL SPINE FINDINGS Alignment: Normal. Skull base and vertebrae: No acute fracture. No primary bone lesion or focal pathologic process. Soft tissues and spinal canal: No prevertebral fluid or swelling. No visible canal hematoma. Disc levels: Multilevel degenerative disc disease, most severe at C4-C5, C5-C6, C6-C7 and C7-T1. Moderate multilevel facet arthropathy. Upper chest: Mild centrilobular and paraseptal emphysema. Other: Left-sided pacemaker leads incidentally noted. IMPRESSION: 1. No evidence of significant acute traumatic injury to the skull, brain or cervical spine. 2. Moderate cerebral and mild cerebellar atrophy with extensive chronic microvascular ischemic changes in the cerebral white matter, as above. 3. Multilevel degenerative disc disease and cervical spondylosis, as above. Electronically Signed   By: Vinnie Langton M.D.   On: 04/06/2021 05:35   CT CERVICAL SPINE WO CONTRAST  Result Date: 04/06/2021 CLINICAL DATA:  80 year old male with history of trauma after falling out of bed. EXAM: CT HEAD WITHOUT CONTRAST CT CERVICAL SPINE WITHOUT CONTRAST TECHNIQUE: Multidetector CT imaging of the head and cervical spine was performed following the standard protocol without intravenous contrast. Multiplanar CT  image reconstructions of the cervical spine were also generated. COMPARISON:  Head and cervical spine CT scan 12/08/2020. FINDINGS: CT HEAD FINDINGS Brain: Moderate cerebral and mild cerebellar atrophy. Patchy and confluent areas of decreased attenuation are noted throughout the deep and periventricular white matter of the cerebral hemispheres bilaterally, compatible with chronic microvascular ischemic disease. No evidence of acute infarction, hemorrhage, hydrocephalus, extra-axial collection or mass lesion/mass effect. Vascular: No hyperdense vessel or unexpected calcification. Skull: Normal. Negative for fracture or focal lesion. Sinuses/Orbits: No acute finding. Other: None. CT CERVICAL SPINE FINDINGS Alignment: Normal. Skull base and vertebrae: No acute fracture. No primary bone lesion or focal pathologic process. Soft tissues and spinal canal: No prevertebral fluid or swelling. No  visible canal hematoma. Disc levels: Multilevel degenerative disc disease, most severe at C4-C5, C5-C6, C6-C7 and C7-T1. Moderate multilevel facet arthropathy. Upper chest: Mild centrilobular and paraseptal emphysema. Other: Left-sided pacemaker leads incidentally noted. IMPRESSION: 1. No evidence of significant acute traumatic injury to the skull, brain or cervical spine. 2. Moderate cerebral and mild cerebellar atrophy with extensive chronic microvascular ischemic changes in the cerebral white matter, as above. 3. Multilevel degenerative disc disease and cervical spondylosis, as above. Electronically Signed   By: Vinnie Langton M.D.   On: 04/06/2021 05:35    Procedures Procedures   Medications Ordered in ED Medications - No data to display  ED Course  I have reviewed the triage vital signs and the nursing notes.  Pertinent labs & imaging results that were available during my care of the patient were reviewed by me and considered in my medical decision making (see chart for details).    MDM Rules/Calculators/A&P                            Patient presents to the emergency department for evaluation after questionable fall.  Patient was found on the floor next to his bed.  EMS report that the bed is essentially on the floor.  He does not have any external signs of injury.  There is a questionable contusion to the left forehead but this looks more like a lipoma.  Family reportedly told staff at the center that this is a chronic finding.  A CT of his head and cervical spine were performed.  No injury is noted.  Patient moving all 4 extremities without difficulty.  No evidence of thoracic abdominal injury.  No evidence of spinal injury.  Patient will be returned to facility.  Final Clinical Impression(s) / ED Diagnoses Final diagnoses:  Contusion of face, initial encounter    Rx / DC Orders ED Discharge Orders     None        Anniece Bleiler, Gwenyth Allegra, MD 04/06/21 [phone]

## 2021-04-06 NOTE — ED Triage Notes (Signed)
Pt fell from lowered bed approximately 6 inches to floor hitting his head on floor. Pt has appro 1 inch diameter hematoma above left eye, unknown if that was present before fall. Pt is on plavix daily. No change in mental status from baseline.

## 2021-04-06 NOTE — Telephone Encounter (Signed)
Called and sw nurse was not able to make appt fo eval tomorrow sch for Wednesday with Erin at 9:30 will have family to come as well. Vac has died and advised my remove and apply a dry dressing change daily and will update orders at visit.

## 2021-04-08 ENCOUNTER — Ambulatory Visit (INDEPENDENT_AMBULATORY_CARE_PROVIDER_SITE_OTHER): Payer: Medicare Other | Admitting: Family

## 2021-04-08 ENCOUNTER — Encounter: Payer: Self-pay | Admitting: Family

## 2021-04-08 DIAGNOSIS — M869 Osteomyelitis, unspecified: Secondary | ICD-10-CM

## 2021-04-08 NOTE — Progress Notes (Signed)
Post-Op Visit Note   Patient: Todd Dunn           Date of Birth: [birth year]           MRN: [id number] Visit Date: 04/08/2021 PCP: Fredrich Romans, PA  Chief Complaint:  Chief Complaint  Patient presents with   Right Foot - Routine Post Op    GT amputation 04/01/21    HPI:  HPI The patient is an 80 year old gentleman seen status post right first ray amputation October 26.  He is residing at [facility].  Ortho Exam On examination of the right foot the incision is well approximated with sutures healing well proximally.  There is no drainage no erythema no sign of infection  Visit Diagnoses: No diagnosis found.  Plan: Begin daily Dial soap cleansing.  Dry dressing changes.  Minimize weightbearing on the right foot.  Follow-up in 2 weeks for suture removal.  Follow-Up Instructions: No follow-ups on file.   Imaging: No results found.  Orders:  No orders of the defined types were placed in this encounter.  No orders of the defined types were placed in this encounter.    PMFS History: Patient Active Problem List   Diagnosis Date Noted   Protein-calorie malnutrition, severe 03/27/2021   Toe infection    PVD (peripheral vascular disease) ([place])    Osteomyelitis of great toe of right foot ([place]) 03/20/2021   Wound infection 03/20/2021   Pacemaker 03/20/2021   Elevated alkaline phosphatase level 03/20/2021   Hyperlipidemia 03/20/2021   Hematuria 02/14/2021   Acute cystitis with hematuria    Normocytic anemia 01/06/2021   Catheter-associated urinary tract infection ([place]) 17/61/6073   Acute metabolic encephalopathy 71/11/2692   Chronic indwelling Foley catheter 12/15/2020   HTN (hypertension) 12/15/2020   History of ESBL E. coli infection 12/15/2020   Past Medical History:  Diagnosis Date   BPH (benign prostatic hyperplasia)    CAD (coronary artery disease)    Chronic indwelling Foley catheter    CVA (cerebral vascular accident) ([place])    HTN  (hypertension)    Neurogenic bladder    SSS (sick sinus syndrome) ([place])    s/p MDT ICD   Vitamin D deficiency     Family History  Family history unknown: Yes    Past Surgical History:  Procedure Laterality Date   ABDOMINAL AORTOGRAM W/LOWER EXTREMITY N/A 03/23/2021   Procedure: ABDOMINAL AORTOGRAM W/LOWER EXTREMITY;  Surgeon: Waynetta Sandy, MD;  Location: [place] [facility];  Service: Cardiovascular;  Laterality: N/A;   AMPUTATION Right 04/01/2021   Procedure: RIGHT GREAT TOE AMPUTATION;  Surgeon: Newt Minion, MD;  Location: [place];  Service: Orthopedics;  Laterality: Right;   APPENDECTOMY     APPLICATION OF WOUND VAC Right 04/01/2021   Procedure: APPLICATION OF WOUND VAC;  Surgeon: Newt Minion, MD;  Location: [place];  Service: Orthopedics;  Laterality: Right;   CARDIAC SURGERY     FEMORAL-TIBIAL BYPASS GRAFT Right 03/26/2021   Procedure: BYPASS GRAFT FEMORAL-TIBIAL ARTERY.  Harvest right leg saphenous vein, right vein patch angioplasty posterior tibial.;  Surgeon: Marty Heck, MD;  [facility];  Service: Vascular;  Laterality: Right;   PACEMAKER PLACEMENT     Social History   Occupational History   Not on file  Tobacco Use   Smoking status: Not on file   Smokeless tobacco: Never  Substance and Sexual Activity   Alcohol use: Not Currently   Drug use: Never   Sexual activity: Not on  file

## 2021-04-11 ENCOUNTER — Other Ambulatory Visit: Payer: Self-pay

## 2021-04-11 DIAGNOSIS — I739 Peripheral vascular disease, unspecified: Secondary | ICD-10-CM

## 2021-04-12 ENCOUNTER — Encounter (HOSPITAL_COMMUNITY): Payer: Self-pay | Admitting: Emergency Medicine

## 2021-04-12 ENCOUNTER — Emergency Department (HOSPITAL_COMMUNITY)
Admission: EM | Admit: 2021-04-12 | Discharge: 2021-04-13 | Disposition: A | Discharge status: Home / Self Care | Payer: Medicare Other | Attending: Emergency Medicine | Admitting: Emergency Medicine

## 2021-04-12 ENCOUNTER — Other Ambulatory Visit: Payer: Self-pay

## 2021-04-12 DIAGNOSIS — T83028A Displacement of other indwelling urethral catheter, initial encounter: Secondary | ICD-10-CM | POA: Diagnosis present

## 2021-04-12 DIAGNOSIS — Z7902 Long term (current) use of antithrombotics/antiplatelets: Secondary | ICD-10-CM | POA: Diagnosis not present

## 2021-04-12 DIAGNOSIS — Y846 Urinary catheterization as the cause of abnormal reaction of the patient, or of later complication, without mention of misadventure at the time of the procedure: Secondary | ICD-10-CM | POA: Diagnosis not present

## 2021-04-12 DIAGNOSIS — Z79899 Other long term (current) drug therapy: Secondary | ICD-10-CM | POA: Diagnosis not present

## 2021-04-12 DIAGNOSIS — I2510 Atherosclerotic heart disease of native coronary artery without angina pectoris: Secondary | ICD-10-CM | POA: Insufficient documentation

## 2021-04-12 DIAGNOSIS — T83021A Displacement of indwelling urethral catheter, initial encounter: Secondary | ICD-10-CM

## 2021-04-12 DIAGNOSIS — I10 Essential (primary) hypertension: Secondary | ICD-10-CM | POA: Diagnosis not present

## 2021-04-12 NOTE — ED Triage Notes (Signed)
Pt BIB EMS from [facility]. EMS states that he pulled his catheter out and is bleeding.

## 2021-04-12 NOTE — ED Provider Notes (Signed)
[place] DEPT Provider Note  CSN: [id number] Arrival date & time: 04/12/21 2307  Chief Complaint(s) Foley Cath out  HPI Todd Dunn is a 80 y.o. male with a past medical history listed below including neurogenic bladder requiring chronic indwelling catheter who presents to the ED after he pulled out his Foley catheter at the skilled nursing facility.  Patient had bleeding following the dislodgment of the catheter.  Remainder of history, ROS, and physical exam limited due to patient's condition (dementia). Additional information was obtained from EMS.   Level V Caveat.   HPI  Past Medical History Past Medical History:  Diagnosis Date   BPH (benign prostatic hyperplasia)    CAD (coronary artery disease)    Chronic indwelling Foley catheter    CVA (cerebral vascular accident) ([place])    HTN (hypertension)    Neurogenic bladder    SSS (sick sinus syndrome) ([place])    s/p MDT ICD   Vitamin D deficiency    Patient Active Problem List   Diagnosis Date Noted   Protein-calorie malnutrition, severe 03/27/2021   Toe infection    PVD (peripheral vascular disease) ([place])    Osteomyelitis of great toe of right foot ([place]) 03/20/2021   Wound infection 03/20/2021   Pacemaker 03/20/2021   Elevated alkaline phosphatase level 03/20/2021   Hyperlipidemia 03/20/2021   Hematuria 02/14/2021   Acute cystitis with hematuria    Normocytic anemia 01/06/2021   Catheter-associated urinary tract infection ([place]) 75/17/0017   Acute metabolic encephalopathy 49/44/9675   Chronic indwelling Foley catheter 12/15/2020   HTN (hypertension) 12/15/2020   History of ESBL E. coli infection 12/15/2020   Home Medication(s) Prior to Admission medications   Medication Sig Start Date End Date Taking? Authorizing Provider  acetaminophen (TYLENOL) 325 MG tablet Take 650 mg by mouth every 4 (four) hours as needed for mild pain.    [provider]  amLODipine (NORVASC)  10 MG tablet Take 10 mg by mouth daily.    [provider]  bisacodyl (DULCOLAX) 5 MG EC tablet Take 1 tablet (5 mg total) by mouth daily as needed for moderate constipation. 04/05/21   Shawna Clamp, MD  citalopram (CELEXA) 20 MG tablet Take 20 mg by mouth at bedtime.    [provider]  clopidogrel (PLAVIX) 75 MG tablet Take 75 mg by mouth at bedtime.    [provider]  docusate sodium (COLACE) 100 MG capsule Take 1 capsule (100 mg total) by mouth every 12 (twelve) hours. 12/25/20   [name], MD  finasteride (PROSCAR) 5 MG tablet Take 5 mg by mouth daily.    [provider]  furosemide (LASIX) 20 MG tablet Take 20 mg by mouth daily.    [provider]  hydrALAZINE (APRESOLINE) 50 MG tablet Take 50 mg by mouth 2 (two) times daily.    [provider]  HYDROcodone-acetaminophen (NORCO/VICODIN) 5-325 MG tablet Take 1-2 tablets by mouth every 4 (four) hours as needed for moderate pain (pain score 4-6). 04/05/21   Shawna Clamp, MD  isosorbide mononitrate (IMDUR) 60 MG 24 hr tablet Take 60 mg by mouth daily.    [provider]  nitroGLYCERIN (NITROSTAT) 0.4 MG SL tablet Place 0.4 mg under the tongue every 5 (five) minutes as needed for chest pain.    [provider]  pantoprazole (PROTONIX) 40 MG tablet Take 1 tablet (40 mg total) by mouth daily. 04/06/21   Shawna Clamp, MD  rosuvastatin (CRESTOR) 10 MG tablet Take 10  mg by mouth daily.    [provider]  tamsulosin (FLOMAX) 0.4 MG CAPS capsule Take 0.4 mg by mouth daily.    [provider]  vitamin B-12 (CYANOCOBALAMIN) 1000 MCG tablet Take 1 tablet (1,000 mcg total) by mouth daily. 01/08/21   Lincoln Brigham, PA-C  Vitamin D, Ergocalciferol, (DRISDOL) 1.25 MG (50000 UNIT) CAPS capsule Take 50,000 Units by mouth every Monday.    [provider]                                                                                                                                     Past Surgical History Past Surgical History:  Procedure Laterality Date   ABDOMINAL AORTOGRAM W/LOWER EXTREMITY N/A 03/23/2021   Procedure: ABDOMINAL AORTOGRAM W/LOWER EXTREMITY;  Surgeon: Waynetta Sandy, MD;  Location: [place] [facility];  Service: Cardiovascular;  Laterality: N/A;   AMPUTATION Right 04/01/2021   Procedure: RIGHT GREAT TOE AMPUTATION;  Surgeon: Newt Minion, MD;  Location: [place];  Service: Orthopedics;  Laterality: Right;   APPENDECTOMY     APPLICATION OF WOUND VAC Right 04/01/2021   Procedure: APPLICATION OF WOUND VAC;  Surgeon: Newt Minion, MD;  Location: [place];  Service: Orthopedics;  Laterality: Right;   CARDIAC SURGERY     FEMORAL-TIBIAL BYPASS GRAFT Right 03/26/2021   Procedure: BYPASS GRAFT FEMORAL-TIBIAL ARTERY.  Harvest right leg saphenous vein, right vein patch angioplasty posterior tibial.;  Surgeon: Marty Heck, MD;  Location: [place] OR;  Service: Vascular;  Laterality: Right;   PACEMAKER PLACEMENT     Family History Family History  Family history unknown: Yes    Social History Social History   Tobacco Use   Smokeless tobacco: Never  Substance Use Topics   Alcohol use: Not Currently   Drug use: Never   Allergies Aspirin, [place] clam (m. mercenaria) skin test, and Penicillins  Review of Systems Review of Systems Unable to obtain due to dementia Physical Exam Vital Signs  I have reviewed the triage vital signs BP (!) 127/54   Pulse 69   Temp 99 F (37.2 C)   Resp 16   Ht 6' (1.829 m)   Wt 81.6 kg   SpO2 100%   BMI 24.40 kg/m   Physical Exam Vitals reviewed.  Constitutional:      General: He is not in acute distress.    Appearance: He is well-developed. He is not diaphoretic.  HENT:     Head: Normocephalic and atraumatic.     Right Ear: External ear normal.     Left Ear: External ear normal.     Nose: Nose normal.     Mouth/Throat:     Mouth: Mucous membranes are moist.   Eyes:     General: No scleral icterus.    Conjunctiva/sclera: Conjunctivae normal.  Neck:     Trachea: Phonation normal.  Cardiovascular:     Rate  and Rhythm: Normal rate and regular rhythm.  Pulmonary:     Effort: Pulmonary effort is normal. No respiratory distress.     Breath sounds: No stridor.  Abdominal:     General: There is no distension.  Genitourinary:    Comments: No active bleeding. Clots and dried blood in adult diaper Musculoskeletal:        General: Normal range of motion.     Cervical back: Normal range of motion.  Neurological:     Mental Status: He is alert and oriented to person, place, and time.  Psychiatric:        Behavior: Behavior normal.    ED Results and Treatments Labs (all labs ordered are listed, but only abnormal results are displayed) Labs Reviewed - No data to display                                                                                                                       EKG  EKG Interpretation  Date/Time:    Ventricular Rate:    PR Interval:    QRS Duration:   QT Interval:    QTC Calculation:   R Axis:     Text Interpretation:         Radiology No results found.  Pertinent labs & imaging results that were available during my care of the patient were reviewed by me and considered in my medical decision making (see MDM for details).  Medications Ordered in ED Medications - No data to display                                                                                                                                   Procedures Procedures  (including critical care time)  Medical Decision Making / ED Course I have reviewed the nursing notes for this encounter and the patient's prior records (if available in EHR or on provided paperwork).  Todd Dunn was evaluated in Emergency Department on 04/13/2021 for the symptoms described in the history of present illness. He was evaluated in the context of the global  COVID-19 pandemic, which necessitated consideration that the patient might be at risk for infection with the SARS-CoV-2 virus that causes COVID-19. Institutional protocols and algorithms that pertain to the evaluation of patients at risk for COVID-19 are in a state of rapid change based on information released by regulatory bodies including the CDC and federal and state  organizations. These policies and algorithms were followed during the patient's care in the ED.     Foley dislodgement. No active bleeding. Foley reinserted. Approx 30 cc of nonbloody, clear urine drained.     Final Clinical Impression(s) / ED Diagnoses Final diagnoses:  Displacement of Foley catheter, initial encounter [facility])   The patient appears reasonably screened and/or stabilized for discharge and I doubt any other medical condition or other [facility] requiring further screening, evaluation, or treatment in the ED at this time prior to discharge. Safe for discharge with strict return precautions.  Disposition: Discharge  Condition: Good  I have discussed the results, Dx and Tx plan with the patient/family who expressed understanding and agree(s) with the plan. Discharge instructions discussed at length. The patient/family was given strict return precautions who verbalized understanding of the instructions. No further questions at time of discharge.    ED Discharge Orders     None        Follow Up: Fredrich Romans, [place] [address] [phone]  Call  as needed      This chart was dictated using voice recognition software.  Despite best efforts to proofread,  errors can occur which can change the documentation meaning.    Fatima Blank, MD 04/13/21 [phone]

## 2021-04-15 ENCOUNTER — Encounter: Payer: Self-pay | Admitting: Family

## 2021-04-15 ENCOUNTER — Ambulatory Visit (INDEPENDENT_AMBULATORY_CARE_PROVIDER_SITE_OTHER): Payer: Medicare Other | Admitting: Family

## 2021-04-15 DIAGNOSIS — M869 Osteomyelitis, unspecified: Secondary | ICD-10-CM

## 2021-04-15 NOTE — Progress Notes (Signed)
Post-Op Visit Note   Patient: Todd Dunn           Date of Birth: 1940-12-24           MRN: [id number] Visit Date: 04/15/2021 PCP: Fredrich Romans, PA  Chief Complaint:  Chief Complaint  Patient presents with   Right Foot - Follow-up    HPI:  HPI The patient is an 80 year old gentleman seen status post right great toe amputation on October 26 of this year.  He is residing at skilled nursing.  Today he is in a wheelchair.  Ortho Exam On examination of the right first ray amputation the incision is well-healed proximally sutures were harvested here unfortunately distally there is a 3 cm area of dehiscence this is 1 cm wide and filled in with 100% fibrinous exudative tissue there is moderate serous drainage.  There is no erythema no odor no warmth  Visit Diagnoses: No diagnosis found.  Plan: Continue daily Dial soap cleansing.  Dry dressing changes.  He will follow-up once more in 2 weeks.  Minimize weightbearing.  Follow-Up Instructions: No follow-ups on file.   Imaging: No results found.  Orders:  No orders of the defined types were placed in this encounter.  No orders of the defined types were placed in this encounter.    PMFS History: Patient Active Problem List   Diagnosis Date Noted   Protein-calorie malnutrition, severe 03/27/2021   Toe infection    PVD (peripheral vascular disease) ([place])    Osteomyelitis of great toe of right foot ([place]) 03/20/2021   Wound infection 03/20/2021   Pacemaker 03/20/2021   Elevated alkaline phosphatase level 03/20/2021   Hyperlipidemia 03/20/2021   Hematuria 02/14/2021   Acute cystitis with hematuria    Normocytic anemia 01/06/2021   Catheter-associated urinary tract infection ([place]) 70/62/3762   Acute metabolic encephalopathy 83/15/1761   Chronic indwelling Foley catheter 12/15/2020   HTN (hypertension) 12/15/2020   History of ESBL E. coli infection 12/15/2020   Past Medical History:  Diagnosis Date   BPH (benign  prostatic hyperplasia)    CAD (coronary artery disease)    Chronic indwelling Foley catheter    CVA (cerebral vascular accident) ([place])    HTN (hypertension)    Neurogenic bladder    SSS (sick sinus syndrome) ([place])    s/p MDT ICD   Vitamin D deficiency     Family History  Family history unknown: Yes    Past Surgical History:  Procedure Laterality Date   ABDOMINAL AORTOGRAM W/LOWER EXTREMITY N/A 03/23/2021   Procedure: ABDOMINAL AORTOGRAM W/LOWER EXTREMITY;  Surgeon: Waynetta Sandy, MD;  Location: [place] [facility];  Service: Cardiovascular;  Laterality: N/A;   AMPUTATION Right 04/01/2021   Procedure: RIGHT GREAT TOE AMPUTATION;  Surgeon: Newt Minion, MD;  Location: [place];  Service: Orthopedics;  Laterality: Right;   APPENDECTOMY     APPLICATION OF WOUND VAC Right 04/01/2021   Procedure: APPLICATION OF WOUND VAC;  Surgeon: Newt Minion, MD;  Location: [place];  Service: Orthopedics;  Laterality: Right;   CARDIAC SURGERY     FEMORAL-TIBIAL BYPASS GRAFT Right 03/26/2021   Procedure: BYPASS GRAFT FEMORAL-TIBIAL ARTERY.  Harvest right leg saphenous vein, right vein patch angioplasty posterior tibial.;  Surgeon: Marty Heck, MD;  [facility];  Service: Vascular;  Laterality: Right;   PACEMAKER PLACEMENT     Social History   Occupational History   Not on file  Tobacco Use   Smoking status: Not on file  Smokeless tobacco: Never  Substance and Sexual Activity   Alcohol use: Not Currently   Drug use: Never   Sexual activity: Not on file

## 2021-04-21 ENCOUNTER — Telehealth: Payer: Self-pay

## 2021-04-21 NOTE — Telephone Encounter (Signed)
Pt will come in tomorrow for appt. Per Dr. Sharol Given

## 2021-04-21 NOTE — Telephone Encounter (Signed)
Kella from [facility] called and stated pt's wound is draining more and dehiscence is worse as well. They have been following instructions but wanted to make sure we were aware. I did advise her that he is scheduled to come in tomorrow to see Junie Panning but she stated if anymore questions can call her back @ [phone]

## 2021-04-22 ENCOUNTER — Ambulatory Visit (INDEPENDENT_AMBULATORY_CARE_PROVIDER_SITE_OTHER): Payer: Medicare Other | Admitting: Family

## 2021-04-22 ENCOUNTER — Encounter: Payer: Self-pay | Admitting: Family

## 2021-04-22 ENCOUNTER — Telehealth: Payer: Self-pay

## 2021-04-22 DIAGNOSIS — M869 Osteomyelitis, unspecified: Secondary | ICD-10-CM

## 2021-04-22 MED ORDER — DOXYCYCLINE HYCLATE 100 MG PO TABS: 100.0000 mg | ORAL_TABLET | Freq: Two times a day (BID) | ORAL | 0 refills | Status: DC

## 2021-04-22 MED ORDER — NITROGLYCERIN 0.2 MG/HR TD PT24: 0.2000 mg | MEDICATED_PATCH | Freq: Every day | TRANSDERMAL | 0 refills | Status: DC

## 2021-04-22 NOTE — Progress Notes (Signed)
Post-Op Visit Note   Patient: Todd Dunn           Date of Birth: 1941-05-16           MRN: [id number] Visit Date: 04/22/2021 PCP: Fredrich Romans, PA  Chief Complaint:  Chief Complaint  Patient presents with   Right Foot - Routine Post Op    04/01/21 right GT amp     HPI:  HPI The patient is an 80 year old gentleman seen status post right great toe amputation on October 26 of this year.  He is residing at skilled nursing.  Today he is in a wheelchair.  Ortho Exam On examination of the right first ray amputation the incision is well-healed proximally.  Distally there is an area of dehiscence this is a length of 2.5 cm this is about 1 centimeter in width this is filled in with 100% fibrinous exudative tissue.  There is scant serous drainage there is no erythema no odor no warmth    Visit Diagnoses: No diagnosis found.  Plan: Begin silver cell dressing changes packed this open we will provide a prescription for nitroglycerin patches which will be rotated sites on the right foot daily he will follow-up in the office in 2 weeks.  On doxycycline as well  Follow-Up Instructions: No follow-ups on file.   Imaging: No results found.  Orders:  No orders of the defined types were placed in this encounter.  No orders of the defined types were placed in this encounter.    PMFS History: Patient Active Problem List   Diagnosis Date Noted   Protein-calorie malnutrition, severe 03/27/2021   Toe infection    PVD (peripheral vascular disease) ([place])    Osteomyelitis of great toe of right foot ([place]) 03/20/2021   Wound infection 03/20/2021   Pacemaker 03/20/2021   Elevated alkaline phosphatase level 03/20/2021   Hyperlipidemia 03/20/2021   Hematuria 02/14/2021   Acute cystitis with hematuria    Normocytic anemia 01/06/2021   Catheter-associated urinary tract infection ([place]) 38/18/2993   Acute metabolic encephalopathy 71/69/6789   Chronic indwelling Foley catheter 12/15/2020    HTN (hypertension) 12/15/2020   History of ESBL E. coli infection 12/15/2020   Past Medical History:  Diagnosis Date   BPH (benign prostatic hyperplasia)    CAD (coronary artery disease)    Chronic indwelling Foley catheter    CVA (cerebral vascular accident) ([place])    HTN (hypertension)    Neurogenic bladder    SSS (sick sinus syndrome) ([place])    s/p MDT ICD   Vitamin D deficiency     Family History  Family history unknown: Yes    Past Surgical History:  Procedure Laterality Date   ABDOMINAL AORTOGRAM W/LOWER EXTREMITY N/A 03/23/2021   Procedure: ABDOMINAL AORTOGRAM W/LOWER EXTREMITY;  Surgeon: Waynetta Sandy, MD;  Location: [place] [facility];  Service: Cardiovascular;  Laterality: N/A;   AMPUTATION Right 04/01/2021   Procedure: RIGHT GREAT TOE AMPUTATION;  Surgeon: Newt Minion, MD;  Location: [place];  Service: Orthopedics;  Laterality: Right;   APPENDECTOMY     APPLICATION OF WOUND VAC Right 04/01/2021   Procedure: APPLICATION OF WOUND VAC;  Surgeon: Newt Minion, MD;  Location: [facility] [facility];  Service: Orthopedics;  Laterality: Right;   CARDIAC SURGERY     FEMORAL-TIBIAL BYPASS GRAFT Right 03/26/2021   Procedure: BYPASS GRAFT FEMORAL-TIBIAL ARTERY.  Harvest right leg saphenous vein, right vein patch angioplasty posterior tibial.;  Surgeon: Marty Heck, MD;  Location: [place];  Service: Vascular;  Laterality: Right;   PACEMAKER PLACEMENT     Social History   Occupational History   Not on file  Tobacco Use   Smoking status: Not on file   Smokeless tobacco: Never  Substance and Sexual Activity   Alcohol use: Not Currently   Drug use: Never   Sexual activity: Not on file

## 2021-04-22 NOTE — Telephone Encounter (Signed)
Phone rang for 6 minutes without answer. Will hold and try again.

## 2021-04-22 NOTE — Addendum Note (Signed)
Addended by: Dondra Prader R on: 04/22/2021 09:57 AM   Modules accepted: Orders

## 2021-04-22 NOTE — Telephone Encounter (Signed)
[place] with [facility] would like clarification on Nitroglycerin patch.  Cb# [phone].  Please advise.  Thank you.

## 2021-04-24 NOTE — Telephone Encounter (Signed)
called and sw unit manager Charlene and advised that this is a 0.2 mg nitro patch to apply alternating sides of the incision change daily around wound to help improve micro circulation of the foot. Will call with any questions. S/p right GT amp

## 2021-04-28 ENCOUNTER — Other Ambulatory Visit: Payer: Self-pay

## 2021-04-28 ENCOUNTER — Ambulatory Visit (HOSPITAL_COMMUNITY)
Admission: RE | Admit: 2021-04-28 | Discharge: 2021-04-28 | Disposition: A | Discharge status: Home / Self Care | Payer: No Typology Code available for payment source | Source: Ambulatory Visit | Attending: Vascular Surgery | Admitting: Vascular Surgery

## 2021-04-28 ENCOUNTER — Ambulatory Visit (INDEPENDENT_AMBULATORY_CARE_PROVIDER_SITE_OTHER)
Admit: 2021-04-28 | Discharge: 2021-04-28 | Disposition: A | Discharge status: Home / Self Care | Payer: No Typology Code available for payment source | Attending: Vascular Surgery | Admitting: Vascular Surgery

## 2021-04-28 ENCOUNTER — Ambulatory Visit (INDEPENDENT_AMBULATORY_CARE_PROVIDER_SITE_OTHER): Payer: Medicare Other | Admitting: Physician Assistant

## 2021-04-28 ENCOUNTER — Ambulatory Visit: Payer: Self-pay

## 2021-04-28 ENCOUNTER — Encounter: Payer: Self-pay | Admitting: Physician Assistant

## 2021-04-28 ENCOUNTER — Encounter: Payer: Self-pay | Admitting: Family

## 2021-04-28 ENCOUNTER — Ambulatory Visit (INDEPENDENT_AMBULATORY_CARE_PROVIDER_SITE_OTHER): Payer: Medicare Other | Admitting: Family

## 2021-04-28 VITALS — BP 135/69 | HR 70 | Temp 97.3°F | Resp 20 | Ht 70.0 in | Wt 171.0 lb

## 2021-04-28 DIAGNOSIS — I739 Peripheral vascular disease, unspecified: Secondary | ICD-10-CM | POA: Insufficient documentation

## 2021-04-28 DIAGNOSIS — R1311 Dysphagia, oral phase: Secondary | ICD-10-CM | POA: Insufficient documentation

## 2021-04-28 DIAGNOSIS — M79671 Pain in right foot: Secondary | ICD-10-CM

## 2021-04-28 DIAGNOSIS — M869 Osteomyelitis, unspecified: Secondary | ICD-10-CM

## 2021-04-28 NOTE — Progress Notes (Signed)
Office Note     CC:  follow up Requesting Provider:  Fredrich Romans, PA  HPI: Todd Dunn is a 80 y.o. (Sep 17, 1940) male who presents for routine follow-up of Right common femoral artery to posterior tibial artery bypass using 6 mm ringed PTFE and  vein patch angioplasty of the right posterior tibial artery  in the mid calf with great saphenous vein on 03/26/2021 by Dr. Carlis Abbott.  He was seen in consultation at the hospital where he was admitted with right lower extremity tissue loss with severely depressed ABI of 0.36.  He underwent arteriogram and ultimately needed to tibial bypass to the posterior tibial artery.  He had no usable long segment saphenous vein.  He has history of cognitive impairment and is a resident at [facility].  History of CVA, neurogenic bladder. He arrives at the clinic today without accompaniment.  He is getting Plavix and statin at the health care center.  Past Medical History:  Diagnosis Date   BPH (benign prostatic hyperplasia)    CAD (coronary artery disease)    Chronic indwelling Foley catheter    CVA (cerebral vascular accident) ([place])    HTN (hypertension)    Neurogenic bladder    SSS (sick sinus syndrome) ([place])    s/p MDT ICD   Vitamin D deficiency     Past Surgical History:  Procedure Laterality Date   ABDOMINAL AORTOGRAM W/LOWER EXTREMITY N/A 03/23/2021   Procedure: ABDOMINAL AORTOGRAM W/LOWER EXTREMITY;  Surgeon: Waynetta Sandy, MD;  Location: [facility];  Service: Cardiovascular;  Laterality: N/A;   AMPUTATION Right 04/01/2021   Procedure: RIGHT GREAT TOE AMPUTATION;  Surgeon: Newt Minion, MD;  Location: [facility];  Service: Orthopedics;  Laterality: Right;   APPENDECTOMY     APPLICATION OF WOUND VAC Right 04/01/2021   Procedure: APPLICATION OF WOUND VAC;  Surgeon: Newt Minion, MD;  Location: [facility];  Service: Orthopedics;  Laterality: Right;   CARDIAC SURGERY     FEMORAL-TIBIAL BYPASS GRAFT Right  03/26/2021   Procedure: BYPASS GRAFT FEMORAL-TIBIAL ARTERY.  Harvest right leg saphenous vein, right vein patch angioplasty posterior tibial.;  Surgeon: Marty Heck, MD;  [facility];  Service: Vascular;  Laterality: Right;   PACEMAKER PLACEMENT      Social History   Socioeconomic History   Marital status: Widowed    Spouse name: Not on file   Number of children: Not on file   Years of education: Not on file   Highest education level: Not on file  Occupational History   Not on file  Tobacco Use   Smoking status: Not on file   Smokeless tobacco: Never  Substance and Sexual Activity   Alcohol use: Not Currently   Drug use: Never   Sexual activity: Not on file  Other Topics Concern   Not on file  Social History Narrative   ** Merged History Encounter **       Social Determinants of Health   Financial Resource Strain: Not on file  Food Insecurity: Not on file  Transportation Needs: Not on file  Physical Activity: Not on file  Stress: Not on file  Social Connections: Not on file  Intimate Partner Violence: Not on file   Family History  Family history unknown: Yes    Current Outpatient Medications  Medication Sig Dispense Refill   acetaminophen (TYLENOL) 325 MG tablet Take 650 mg by mouth every 4 (four) hours as needed for mild pain.  amLODipine (NORVASC) 10 MG tablet Take 10 mg by mouth daily.     bisacodyl (DULCOLAX) 5 MG EC tablet Take 1 tablet (5 mg total) by mouth daily as needed for moderate constipation. 30 tablet 0   citalopram (CELEXA) 20 MG tablet Take 20 mg by mouth at bedtime.     clopidogrel (PLAVIX) 75 MG tablet Take 75 mg by mouth at bedtime.     docusate sodium (COLACE) 100 MG capsule Take 1 capsule (100 mg total) by mouth every 12 (twelve) hours. 60 capsule 0   doxycycline (VIBRA-TABS) 100 MG tablet Take 1 tablet (100 mg total) by mouth 2 (two) times daily. 60 tablet 0   finasteride (PROSCAR) 5 MG tablet Take 5 mg by mouth daily.      furosemide (LASIX) 20 MG tablet Take 20 mg by mouth daily.     hydrALAZINE (APRESOLINE) 50 MG tablet Take 50 mg by mouth 2 (two) times daily.     HYDROcodone-acetaminophen (NORCO/VICODIN) 5-325 MG tablet Take 1-2 tablets by mouth every 4 (four) hours as needed for moderate pain (pain score 4-6). 10 tablet 0   isosorbide mononitrate (IMDUR) 60 MG 24 hr tablet Take 60 mg by mouth daily.     nitroGLYCERIN (NITRODUR - DOSED IN MG/24 HR) 0.2 mg/hr patch Place 1 patch (0.2 mg total) onto the skin daily. 30 patch 0   nitroGLYCERIN (NITROSTAT) 0.4 MG SL tablet Place 0.4 mg under the tongue every 5 (five) minutes as needed for chest pain.     pantoprazole (PROTONIX) 40 MG tablet Take 1 tablet (40 mg total) by mouth daily. 30 tablet 1   rosuvastatin (CRESTOR) 10 MG tablet Take 10 mg by mouth daily.     tamsulosin (FLOMAX) 0.4 MG CAPS capsule Take 0.4 mg by mouth daily.     vitamin B-12 (CYANOCOBALAMIN) 1000 MCG tablet Take 1 tablet (1,000 mcg total) by mouth daily. 30 tablet 3   Vitamin D, Ergocalciferol, (DRISDOL) 1.25 MG (50000 UNIT) CAPS capsule Take 50,000 Units by mouth every Monday.     No current facility-administered medications for this visit.    Allergies  Allergen Reactions   Aspirin Itching   [employer] (M[employer]en[employer]) Skin Test Itching   Penicillins Itching     REVIEW OF SYSTEMS: Limited due to cognitive impairment  [X]  denotes positive finding, [ ]  denotes negative finding Cardiac  Comments:  Chest pain or chest pressure:    Shortness of breath upon exertion:    Short of breath when lying flat:    Irregular heart rhythm:        Vascular    Pain in calf, thigh, or hip brought on by ambulation:    Pain in feet at night that wakes you up from your sleep:     Blood clot in your veins:    Leg swelling:         Pulmonary    Oxygen at home:    Productive cough:     Wheezing:         Neurologic    Sudden weakness in arms or legs:     Sudden numbness in arms or legs:      Sudden onset of difficulty speaking or slurred speech:    Temporary loss of vision in one eye:     Problems with dizziness:         Gastrointestinal    Blood in stool:     Vomited blood:  Genitourinary    Burning when urinating:     Blood in urine:        Psychiatric    Major depression:         Hematologic    Bleeding problems:    Problems with blood clotting too easily:        Skin    Rashes or ulcers:        Constitutional    Fever or chills:      PHYSICAL EXAMINATION:  Vitals:   04/28/21 1529  BP: 135/69  Pulse: 70  Resp: 20  Temp: (!) 97.3 F (36.3 C)  TempSrc: Temporal  SpO2: 96%  Weight: 171 lb (77.6 kg)  Height: 5\' 10"  (1.778 m)    General:  WDWN in NAD; vital signs documented above Gait: arrives in WC HENT: WNL, normocephalic Pulmonary: normal non-labored breathing Cardiac: irregular HR Skin: without rashes Vascular Exam/Pulses: Extremities: without ischemic changes, without Gangrene , without cellulitis; with open wounds>>left great toe amputation site packed with alginate dressing  Musculoskeletal: + muscle wasting   Neurologic: A&O X 3;  No focal weakness or paresthesias are detected Psychiatric:  The pt has Abnormal- dysarthria   Non-Invasive Vascular Imaging:   04/28/2021 Summary:  Right: Patent right femoral to posterior tibial artery bypass graft with  increased velocity distal to graft in the 50 - 74% stenosis.     See table(s) above for measurements and observations.   Preliminary    ABIs ABI/TBIToday's ABIToday's TBIPrevious ABIPrevious TBI  +-------+-----------+-----------+------------+------------+  Right  1.09       amputated  0.36        0.08          +-------+-----------+-----------+------------+------------+  Left   0.74       0.59       0.68        0.28          +-------+-----------+-----------+------------+------------+    Summary:  Right: Resting right ankle-brachial index is within  normal range, may be  falsely elevated.   Left: Resting left ankle-brachial index indicates moderate left lower  extremity arterial disease. The left toe-brachial index is abnormal.     *See table(s) above for measurements and observations.     Preliminary    ASSESSMENT/PLAN:: 80 y.o. male here for follow up for right femoral to posterior tibial artery bypass with PTFE graft.  Status post right great toe amputation by Dr. Sharol Given.  Appears to be healing.  Graft is patent.  Right ABIs improved from 0.36 preoperatively to 1.09 today.  This could be falsely elevated due to arterial calcification.  Continue Plavix and statin.  We will have him follow-up in 3 months with ABIs and right lower extremity arterial duplex.  Barbie Banner, PA-C [facility] [phone]  Clinic MD:   Dr. Stanford Breed

## 2021-04-28 NOTE — Progress Notes (Signed)
Post-Op Visit Note   Patient: Todd Dunn           Date of Birth: Apr 09, 1941           MRN: [id number] Visit Date: 04/28/2021 PCP: Fredrich Romans, PA  Chief Complaint:  Chief Complaint  Patient presents with   Right Foot - Routine Post Op    04/01/2021 right foot GT amputation     HPI:  HPI The patient is an 80 year old gentleman seen status post right first ray amputation with dehiscence of incision. Residing at [facility].  Ortho Exam On examination of the right foot his incision has healed well proximally distally.  Incision did have some dehiscence this is open about 2 cm x 1 cm this is 5 mm deep and does probe another 5 mm this does not probe to bone or tendon there is 50% granulation in the wound bed 50% fibrinous exudative tissue there is scant serous drainage there is no surrounding erythema no maceration no sign of infection  Visit Diagnoses:  1. Osteomyelitis of great toe of right foot ([place])     Plan: Radiographs reassuring.  We will have him continue daily Dial soap cleansing.  Please pack area of dehiscence open with silver cell this was done today he will follow-up once more in 2 weeks.  Nonweightbearing on right.  Follow-Up Instructions: No follow-ups on file.   Imaging: No results found.  Orders:  Orders Placed This Encounter  Procedures   XR Foot Complete Right   No orders of the defined types were placed in this encounter.    PMFS History: Patient Active Problem List   Diagnosis Date Noted   Oral phase dysphagia 04/28/2021   Protein-calorie malnutrition, severe 03/27/2021   Toe infection    PVD (peripheral vascular disease) ([place])    Osteomyelitis of great toe of right foot ([place]) 03/20/2021   Wound infection 03/20/2021   Pacemaker 03/20/2021   Elevated alkaline phosphatase level 03/20/2021   Hyperlipidemia 03/20/2021   Hematuria 02/14/2021   Acute cystitis with hematuria    Normocytic anemia 01/06/2021   Catheter-associated  urinary tract infection ([place]) 00/93/8182   Acute metabolic encephalopathy 99/37/1696   Chronic indwelling Foley catheter 12/15/2020   HTN (hypertension) 12/15/2020   History of ESBL E. coli infection 12/15/2020   BPH (benign prostatic hyperplasia) 78/93/8101   Chronic systolic heart failure ([place]) 12/12/2019   Past Medical History:  Diagnosis Date   BPH (benign prostatic hyperplasia)    CAD (coronary artery disease)    Chronic indwelling Foley catheter    CVA (cerebral vascular accident) ([place])    HTN (hypertension)    Neurogenic bladder    SSS (sick sinus syndrome) ([place])    s/p MDT ICD   Vitamin D deficiency     Family History  Family history unknown: Yes    Past Surgical History:  Procedure Laterality Date   ABDOMINAL AORTOGRAM W/LOWER EXTREMITY N/A 03/23/2021   Procedure: ABDOMINAL AORTOGRAM W/LOWER EXTREMITY;  Surgeon: Waynetta Sandy, MD;  Location: [facility];  Service: Cardiovascular;  Laterality: N/A;   AMPUTATION Right 04/01/2021   Procedure: RIGHT GREAT TOE AMPUTATION;  Surgeon: Newt Minion, MD;  Location: [facility];  Service: Orthopedics;  Laterality: Right;   APPENDECTOMY     APPLICATION OF WOUND VAC Right 04/01/2021   Procedure: APPLICATION OF WOUND VAC;  Surgeon: Newt Minion, MD;  Location: [facility];  Service: Orthopedics;  Laterality: Right;   CARDIAC SURGERY  FEMORAL-TIBIAL BYPASS GRAFT Right 03/26/2021   Procedure: BYPASS GRAFT FEMORAL-TIBIAL ARTERY.  Harvest right leg saphenous vein, right vein patch angioplasty posterior tibial.;  Surgeon: Marty Heck, MD;  [facility];  Service: Vascular;  Laterality: Right;   PACEMAKER PLACEMENT     Social History   Occupational History   Not on file  Tobacco Use   Smoking status: Not on file   Smokeless tobacco: Never  Substance and Sexual Activity   Alcohol use: Not Currently   Drug use: Never   Sexual activity: Not on file

## 2021-05-04 ENCOUNTER — Other Ambulatory Visit: Payer: Self-pay

## 2021-05-04 ENCOUNTER — Ambulatory Visit (INDEPENDENT_AMBULATORY_CARE_PROVIDER_SITE_OTHER): Payer: Medicare Other | Admitting: Orthopedic Surgery

## 2021-05-04 ENCOUNTER — Other Ambulatory Visit: Payer: Self-pay | Admitting: Physician Assistant

## 2021-05-04 ENCOUNTER — Encounter: Payer: Self-pay | Admitting: Orthopedic Surgery

## 2021-05-04 DIAGNOSIS — D649 Anemia, unspecified: Secondary | ICD-10-CM

## 2021-05-04 DIAGNOSIS — I739 Peripheral vascular disease, unspecified: Secondary | ICD-10-CM

## 2021-05-04 DIAGNOSIS — S98111A Complete traumatic amputation of right great toe, initial encounter: Secondary | ICD-10-CM

## 2021-05-04 DIAGNOSIS — Z89421 Acquired absence of other right toe(s): Secondary | ICD-10-CM

## 2021-05-04 NOTE — Progress Notes (Signed)
Office Visit Note   Patient: Todd Dunn           Date of Birth: [birth year]           MRN: [id number] Visit Date: 05/04/2021              Requested by: Fredrich Romans, [place] [phone],  [place] [address] PCP: Fredrich Romans, PA  Chief Complaint  Patient presents with   Right Foot - Routine Post Op    04/01/21 right GT amputation       HPI: Patient is an 80 year old gentleman who presents in follow-up status post amputation right great toe he is approximately 3 weeks out from surgery.  Patient is also had a stroke involving the left upper and left lower extremity.  Assessment & Plan: Visit Diagnoses:  1. Amputated great toe of right foot ([place])     Plan: Continue with Dial soap cleansing dressing changes daily we will provide some dressing supplies.  We will get him a short fracture boot to help protect his leg from his wheelchair.  Follow-Up Instructions: Return in about 2 weeks (around 05/18/2021).   Ortho Exam  Patient is alert, oriented, no adenopathy, well-dressed, normal affect, normal respiratory effort. Examination there is healthy granulation tissue in the amputated wound after informed consent a 10 blade knife was used to debride some fibrinous exudative tissue.  The wound bed at 100% healthy granulation tissue approximately 2 x 3 cm and 2 mm deep there is no exposed bone or tendon.  Imaging: No results found. No images are attached to the encounter.  Labs: Lab Results  Component Value Date   HGBA1C 5.6 12/16/2020   ESRSEDRATE 22 (H) 03/20/2021   CRP 1.1 (H) 03/20/2021   REPTSTATUS 03/23/2021 FINAL 03/20/2021   GRAMSTAIN  03/20/2021    NO WBC SEEN RARE GRAM POSITIVE COCCI RARE GRAM POSITIVE RODS    CULT 70,000 COLONIES/mL PROTEUS MIRABILIS (A) 03/20/2021   LABORGA PROTEUS MIRABILIS (A) 03/20/2021     Lab Results  Component Value Date   ALBUMIN 2.8 (L) 03/21/2021   ALBUMIN 3.2 (L) 03/20/2021   ALBUMIN 2.9 (L) 03/02/2021    PREALBUMIN 15.7 (L) 03/20/2021    Lab Results  Component Value Date   MG 1.9 04/02/2021   MG 2.1 02/16/2021   No results found for: VD25OH  Lab Results  Component Value Date   PREALBUMIN 15.7 (L) 03/20/2021   CBC EXTENDED Latest Ref Rng & Units 04/06/2021 04/06/2021 04/01/2021  WBC 4.0 - 10.5 K/uL - 4.6 4.1  RBC 4.22 - 5.81 MIL/uL - 3.25(L) 3.18(L)  HGB 13.0 - 17.0 g/dL 8.8(L) 8.0(L) 8.0(L)  HCT 39.0 - 52.0 % 26.0(L) 26.7(L) 25.7(L)  PLT 150 - 400 K/uL - 290 236  NEUTROABS 1.7 - 7.7 K/uL - 2.4 2.4  LYMPHSABS 0.7 - 4.0 K/uL - 1.7 1.4     There is no height or weight on file to calculate BMI.  Orders:  No orders of the defined types were placed in this encounter.  No orders of the defined types were placed in this encounter.    Procedures: No procedures performed  Clinical Data: No additional findings.  ROS:  All other systems negative, except as noted in the HPI. Review of Systems  Objective: Vital Signs: There were no vitals taken for this visit.  Specialty Comments:  No specialty comments available.  PMFS History: Patient Active Problem List   Diagnosis Date Noted   Oral phase dysphagia 04/28/2021  Protein-calorie malnutrition, severe 03/27/2021   Toe infection    PVD (peripheral vascular disease) ([place])    Osteomyelitis of great toe of right foot ([place]) 03/20/2021   Wound infection 03/20/2021   Pacemaker 03/20/2021   Elevated alkaline phosphatase level 03/20/2021   Hyperlipidemia 03/20/2021   Hematuria 02/14/2021   Acute cystitis with hematuria    Normocytic anemia 01/06/2021   Catheter-associated urinary tract infection ([place]) 36/62/9476   Acute metabolic encephalopathy 54/65/0354   Chronic indwelling Foley catheter 12/15/2020   HTN (hypertension) 12/15/2020   History of ESBL E. coli infection 12/15/2020   BPH (benign prostatic hyperplasia) 65/68/1275   Chronic systolic heart failure ([place]) 12/12/2019   Past Medical History:  Diagnosis Date    BPH (benign prostatic hyperplasia)    CAD (coronary artery disease)    Chronic indwelling Foley catheter    CVA (cerebral vascular accident) ([place])    HTN (hypertension)    Neurogenic bladder    SSS (sick sinus syndrome) ([place])    s/p MDT ICD   Vitamin D deficiency     Family History  Family history unknown: Yes    Past Surgical History:  Procedure Laterality Date   ABDOMINAL AORTOGRAM W/LOWER EXTREMITY N/A 03/23/2021   Procedure: ABDOMINAL AORTOGRAM W/LOWER EXTREMITY;  Surgeon: Waynetta Sandy, MD;  Location: [facility];  Service: Cardiovascular;  Laterality: N/A;   AMPUTATION Right 04/01/2021   Procedure: RIGHT GREAT TOE AMPUTATION;  Surgeon: Newt Minion, MD;  Location: [facility];  Service: Orthopedics;  Laterality: Right;   APPENDECTOMY     APPLICATION OF WOUND VAC Right 04/01/2021   Procedure: APPLICATION OF WOUND VAC;  Surgeon: Newt Minion, MD;  Location: [facility];  Service: Orthopedics;  Laterality: Right;   CARDIAC SURGERY     FEMORAL-TIBIAL BYPASS GRAFT Right 03/26/2021   Procedure: BYPASS GRAFT FEMORAL-TIBIAL ARTERY.  Harvest right leg saphenous vein, right vein patch angioplasty posterior tibial.;  Surgeon: Marty Heck, MD;  [facility];  Service: Vascular;  Laterality: Right;   PACEMAKER PLACEMENT     Social History   Occupational History   Not on file  Tobacco Use   Smoking status: Not on file   Smokeless tobacco: Never  Substance and Sexual Activity   Alcohol use: Not Currently   Drug use: Never   Sexual activity: Not on file

## 2021-05-05 ENCOUNTER — Inpatient Hospital Stay: Payer: Medicare Other

## 2021-05-05 ENCOUNTER — Inpatient Hospital Stay: Payer: Medicare Other | Attending: Physician Assistant | Admitting: Physician Assistant

## 2021-05-05 DIAGNOSIS — E538 Deficiency of other specified B group vitamins: Secondary | ICD-10-CM | POA: Diagnosis not present

## 2021-05-05 DIAGNOSIS — D508 Other iron deficiency anemias: Secondary | ICD-10-CM | POA: Diagnosis not present

## 2021-05-05 DIAGNOSIS — I2510 Atherosclerotic heart disease of native coronary artery without angina pectoris: Secondary | ICD-10-CM | POA: Insufficient documentation

## 2021-05-05 DIAGNOSIS — R634 Abnormal weight loss: Secondary | ICD-10-CM | POA: Insufficient documentation

## 2021-05-05 DIAGNOSIS — I10 Essential (primary) hypertension: Secondary | ICD-10-CM | POA: Insufficient documentation

## 2021-05-05 DIAGNOSIS — N400 Enlarged prostate without lower urinary tract symptoms: Secondary | ICD-10-CM | POA: Insufficient documentation

## 2021-05-05 DIAGNOSIS — D649 Anemia, unspecified: Secondary | ICD-10-CM | POA: Diagnosis not present

## 2021-05-05 DIAGNOSIS — E559 Vitamin D deficiency, unspecified: Secondary | ICD-10-CM | POA: Diagnosis not present

## 2021-05-05 DIAGNOSIS — Z79899 Other long term (current) drug therapy: Secondary | ICD-10-CM | POA: Insufficient documentation

## 2021-05-05 DIAGNOSIS — Z8673 Personal history of transient ischemic attack (TIA), and cerebral infarction without residual deficits: Secondary | ICD-10-CM | POA: Diagnosis not present

## 2021-05-05 LAB — SAMPLE TO BLOOD BANK

## 2021-05-05 LAB — CBC WITH DIFFERENTIAL (CANCER CENTER ONLY)
Abs Immature Granulocytes: 0.01 10*3/uL (ref 0.00–0.07)
Basophils Absolute: 0 10*3/uL (ref 0.0–0.1)
Basophils Relative: 1 %
Eosinophils Absolute: 0 10*3/uL (ref 0.0–0.5)
Eosinophils Relative: 1 %
HCT: 27.8 % — ABNORMAL LOW (ref 39.0–52.0)
Hemoglobin: 8.4 g/dL — ABNORMAL LOW (ref 13.0–17.0)
Immature Granulocytes: 0 %
Lymphocytes Relative: 43 %
Lymphs Abs: 1.7 10*3/uL (ref 0.7–4.0)
MCH: 23.9 pg — ABNORMAL LOW (ref 26.0–34.0)
MCHC: 30.2 g/dL (ref 30.0–36.0)
MCV: 79 fL — ABNORMAL LOW (ref 80.0–100.0)
Monocytes Absolute: 0.3 10*3/uL (ref 0.1–1.0)
Monocytes Relative: 9 %
Neutro Abs: 1.8 10*3/uL (ref 1.7–7.7)
Neutrophils Relative %: 46 %
Platelet Count: 181 10*3/uL (ref 150–400)
RBC: 3.52 MIL/uL — ABNORMAL LOW (ref 4.22–5.81)
RDW: 17.2 % — ABNORMAL HIGH (ref 11.5–15.5)
WBC Count: 3.9 10*3/uL — ABNORMAL LOW (ref 4.0–10.5)
nRBC: 0 % (ref 0.0–0.2)

## 2021-05-05 LAB — RETIC PANEL
Immature Retic Fract: 14.6 % (ref 2.3–15.9)
RBC.: 3.51 MIL/uL — ABNORMAL LOW (ref 4.22–5.81)
Retic Count, Absolute: 68.4 10*3/uL (ref 19.0–186.0)
Retic Ct Pct: 2 % (ref 0.4–3.1)
Reticulocyte Hemoglobin: 21.1 pg — ABNORMAL LOW (ref >27.9)

## 2021-05-05 LAB — FERRITIN: Ferritin: 15 ng/mL — ABNORMAL LOW (ref 24–336)

## 2021-05-05 LAB — IRON AND TIBC
Iron: 20 ug/dL — ABNORMAL LOW (ref 45–182)
Saturation Ratios: 6 % — ABNORMAL LOW (ref 17.9–39.5)
TIBC: 335 ug/dL (ref 250–450)
UIBC: 315 ug/dL

## 2021-05-05 LAB — VITAMIN B12: Vitamin B-12: 632 pg/mL (ref 180–914)

## 2021-05-05 NOTE — Progress Notes (Signed)
[facility] [phone]   [phone]  PROGRESS NOTE  Patient Care Team: Fredrich Romans, [place] as PCP - General (Physician Assistant) Jettie Booze, MD as PCP - Cardiology (Cardiology) Fredrich Romans, [place] (Physician Assistant)  CHIEF COMPLAINTS/PURPOSE OF CONSULTATION:  Anemia  HISTORY OF PRESENTING ILLNESS:  Todd Dunn is a 80 y.o. male returns for a follow-up for normocytic anemia. He was last seen in clinic on 01/06/2021.  In the interim, patient was started on oral vitamin B12 supplementation due to vitamin B12 deficiency.  Patient was recently admitted in October 2022 for dry gangrene of the right great toe and underwent amputation.  Patient is accompanied by his granddaughter, [name], who is the primary caretaker. She is the main historian during the visit.   On exam today, Mr. Funari reports persistent fatigue.  He is recovering from recent right toe amputation which is causing him discomfort.  He uses a wheelchair or walker to ambulate.  He reports fair appetite and decreased weight loss.Patient does not have any nauea, vomiting or abdominal pain.  He refuses to take oral iron supplementation as he feels it causes him diarrhea.  Otherwise, his bowel habits are regular.  Denies easy bruising or signs of bleeding including hematochezia, melena or hematuria. Patient does not have any fevers, chills, night sweats, shortness of breath, chest pain or cough. He has no other complaints.   MEDICAL HISTORY:  Past Medical History:  Diagnosis Date   BPH (benign prostatic hyperplasia)    CAD (coronary artery disease)    Chronic indwelling Foley catheter    CVA (cerebral vascular accident) ([place])    HTN (hypertension)    Neurogenic bladder    SSS (sick sinus syndrome) ([place])    s/p MDT ICD   Vitamin D deficiency     SURGICAL HISTORY: Past Surgical History:  Procedure Laterality Date   ABDOMINAL AORTOGRAM W/LOWER EXTREMITY N/A 03/23/2021    Procedure: ABDOMINAL AORTOGRAM W/LOWER EXTREMITY;  Surgeon: Waynetta Sandy, MD;  Location: [facility];  Service: Cardiovascular;  Laterality: N/A;   AMPUTATION Right 04/01/2021   Procedure: RIGHT GREAT TOE AMPUTATION;  Surgeon: Newt Minion, MD;  Location: [facility];  Service: Orthopedics;  Laterality: Right;   APPENDECTOMY     APPLICATION OF WOUND VAC Right 04/01/2021   Procedure: APPLICATION OF WOUND VAC;  Surgeon: Newt Minion, MD;  Location: [facility];  Service: Orthopedics;  Laterality: Right;   CARDIAC SURGERY     FEMORAL-TIBIAL BYPASS GRAFT Right 03/26/2021   Procedure: BYPASS GRAFT FEMORAL-TIBIAL ARTERY.  Harvest right leg saphenous vein, right vein patch angioplasty posterior tibial.;  Surgeon: Marty Heck, MD;  [facility];  Service: Vascular;  Laterality: Right;   PACEMAKER PLACEMENT      SOCIAL HISTORY: Social History   Socioeconomic History   Marital status: Widowed    Spouse name: Not on file   Number of children: Not on file   Years of education: Not on file   Highest education level: Not on file  Occupational History   Not on file  Tobacco Use   Smoking status: Not on file   Smokeless tobacco: Never  Substance and Sexual Activity   Alcohol use: Not Currently   Drug use: Never   Sexual activity: Not on file  Other Topics Concern   Not on file  Social History Narrative   ** Merged History Encounter **       Social Determinants of Health   Financial  Resource Strain: Not on file  Food Insecurity: Not on file  Transportation Needs: Not on file  Physical Activity: Not on file  Stress: Not on file  Social Connections: Not on file  Intimate Partner Violence: Not on file    FAMILY HISTORY: Family History  Family history unknown: Yes    ALLERGIES:  is allergic to aspirin, [place] clam (m. mercenaria) skin test, and penicillins.  MEDICATIONS:  Current Outpatient Medications  Medication Sig Dispense Refill    acetaminophen (TYLENOL) 325 MG tablet Take 650 mg by mouth every 4 (four) hours as needed for mild pain.     amLODipine (NORVASC) 10 MG tablet Take 10 mg by mouth daily.     bisacodyl (DULCOLAX) 5 MG EC tablet Take 1 tablet (5 mg total) by mouth daily as needed for moderate constipation. 30 tablet 0   citalopram (CELEXA) 20 MG tablet Take 20 mg by mouth at bedtime.     clopidogrel (PLAVIX) 75 MG tablet Take 75 mg by mouth at bedtime.     docusate sodium (COLACE) 100 MG capsule Take 1 capsule (100 mg total) by mouth every 12 (twelve) hours. 60 capsule 0   doxycycline (VIBRA-TABS) 100 MG tablet Take 1 tablet (100 mg total) by mouth 2 (two) times daily. 60 tablet 0   finasteride (PROSCAR) 5 MG tablet Take 5 mg by mouth daily.     furosemide (LASIX) 20 MG tablet Take 20 mg by mouth daily.     isosorbide mononitrate (IMDUR) 60 MG 24 hr tablet Take 60 mg by mouth daily.     nitroGLYCERIN (NITROSTAT) 0.4 MG SL tablet Place 0.4 mg under the tongue every 5 (five) minutes as needed for chest pain.     pantoprazole (PROTONIX) 40 MG tablet Take 1 tablet (40 mg total) by mouth daily. 30 tablet 1   rosuvastatin (CRESTOR) 10 MG tablet Take 10 mg by mouth daily.     tamsulosin (FLOMAX) 0.4 MG CAPS capsule Take 0.4 mg by mouth daily.     vitamin B-12 (CYANOCOBALAMIN) 1000 MCG tablet Take 1 tablet (1,000 mcg total) by mouth daily. 30 tablet 3   Vitamin D, Ergocalciferol, (DRISDOL) 1.25 MG (50000 UNIT) CAPS capsule Take 50,000 Units by mouth every Monday.     hydrALAZINE (APRESOLINE) 50 MG tablet Take 50 mg by mouth 2 (two) times daily. (Patient not taking: Reported on 05/05/2021)     HYDROcodone-acetaminophen (NORCO/VICODIN) 5-325 MG tablet Take 1-2 tablets by mouth every 4 (four) hours as needed for moderate pain (pain score 4-6). (Patient not taking: Reported on 05/05/2021) 10 tablet 0   nitroGLYCERIN (NITRODUR - DOSED IN MG/24 HR) 0.2 mg/hr patch Place 1 patch (0.2 mg total) onto the skin daily. (Patient not  taking: Reported on 05/05/2021) 30 patch 0   No current facility-administered medications for this visit.    REVIEW OF SYSTEMS:   Constitutional: ( - ) fevers, ( - )  chills , ( - ) night sweats Eyes: ( - ) blurriness of vision, ( - ) double vision, ( - ) watery eyes Ears, nose, mouth, throat, and face: ( - ) mucositis, ( - ) sore throat Respiratory: ( - ) cough, ( - ) dyspnea, ( - ) wheezes Cardiovascular: ( - ) palpitation, ( - ) chest discomfort, ( - ) lower extremity swelling Gastrointestinal:  ( - ) nausea, ( - ) heartburn, ( - ) change in bowel habits Skin: ( - ) abnormal skin rashes Lymphatics: ( - ) new lymphadenopathy, ( - )  easy bruising Neurological: ( - ) numbness, ( - ) tingling, ( - ) new weaknesses Behavioral/Psych: ( - ) mood change, ( - ) new changes  All other systems were reviewed with the patient and are negative.  PHYSICAL EXAMINATION: ECOG PERFORMANCE STATUS: 2 - Symptomatic, <50% confined to bed  Vitals:   05/05/21 1550  BP: 128/67  Pulse: 77  Temp: 97.7 F (36.5 C)  SpO2: 100%   Filed Weights   05/05/21 1550  Weight: 134 lb 6.4 oz (61 kg)    GENERAL: thin elderly male in NAD  SKIN: skin color, texture, turgor are normal, no rashes or significant lesions EYES: conjunctiva are pink and non-injected, sclera clear OROPHARYNX: no exudate, no erythema; lips, buccal mucosa, and tongue normal  LUNGS: clear to auscultation and percussion with normal breathing effort HEART: regular rate & rhythm and no murmurs and no lower extremity edema PSYCH: alert & oriented x 3, fluent speech NEURO: no focal motor/sensory deficits  LABORATORY DATA:  I have reviewed the data as listed CBC Latest Ref Rng & Units 05/05/2021 04/06/2021 04/06/2021  WBC 4.0 - 10.5 K/uL 3.9(L) - 4.6  Hemoglobin 13.0 - 17.0 g/dL 8.4(L) 8.8(L) 8.0(L)  Hematocrit 39.0 - 52.0 % 27.8(L) 26.0(L) 26.7(L)  Platelets 150 - 400 K/uL 181 - 290    CMP Latest Ref Rng & Units 05/05/2021 04/06/2021  04/06/2021  Glucose 70 - 99 mg/dL 132(H) 134(H) 141(H)  BUN 8 - 23 mg/dL 19 20 14   Creatinine 0.61 - 1.24 mg/dL 0.87 0.70 0.68  Sodium 135 - 145 mmol/L 143 141 137  Potassium 3.5 - 5.1 mmol/L 3.5 4.0 3.9  Chloride 98 - 111 mmol/L 107 102 102  CO2 22 - 32 mmol/L 31 - 30  Calcium 8.9 - 10.3 mg/dL 9.6 - 10.3  Total Protein 6.5 - 8.1 g/dL 6.8 - -  Total Bilirubin 0.3 - 1.2 mg/dL 0.3 - -  Alkaline Phos 38 - 126 U/L 126 - -  AST 15 - 41 U/L 18 - -  ALT 0 - 44 U/L 20 - -    ASSESSMENT & PLAN Caedin L Oran is a 80 y.o. male returns for a follow up for normocytic anemia.   #Chronic Anemia: --Likely multifactorial from vitamin B12 deficiency and chronic disease.  --Currently takes vitamin B12 supplementation 1000 mcg once daily. --Labs today show persistent anemia with Hgb 8.4, MCV 79.0. Vitamin B12 has improved to 632. Iron panel shows iron deficiency with serum iron 20, iron saturation 6%, ferritin 15.  --Suspect iron deficiency is secondary to recent toe amputation. No signs of bleeding otherwise.  --Patient refuses to take oral iron so we will arrange for IV monoferric x 1 dose at [facility] in 8 weeks with repeat labs.   No orders of the defined types were placed in this encounter.   All questions were answered. The patient knows to call the clinic with any problems, questions or concerns.  I have spent a total of 25 minutes minutes of face-to-face and non-face-to-face time, preparing to see the patient, obtaining and/or reviewing separately obtained history, performing a medically appropriate examination, counseling and educating the patient, ordering medications, documenting clinical information in the electronic health record, and care coordination.   Dede Query, PA-C Department of Hematology/Oncology [place] at [facility] Phone: [phone]

## 2021-05-06 LAB — CMP (CANCER CENTER ONLY)
ALT: 20 U/L (ref 0–44)
AST: 18 U/L (ref 15–41)
Albumin: 3.2 g/dL — ABNORMAL LOW (ref 3.5–5.0)
Alkaline Phosphatase: 126 U/L (ref 38–126)
Anion gap: 5 (ref 5–15)
BUN: 19 mg/dL (ref 8–23)
CO2: 31 mmol/L (ref 22–32)
Calcium: 9.6 mg/dL (ref 8.9–10.3)
Chloride: 107 mmol/L (ref 98–111)
Creatinine: 0.87 mg/dL (ref 0.61–1.24)
GFR, Estimated: >60 mL/min (ref >60)
Glucose, Bld: 132 mg/dL — ABNORMAL HIGH (ref 70–99)
Potassium: 3.5 mmol/L (ref 3.5–5.1)
Sodium: 143 mmol/L (ref 135–145)
Total Bilirubin: 0.3 mg/dL (ref 0.3–1.2)
Total Protein: 6.8 g/dL (ref 6.5–8.1)

## 2021-05-07 ENCOUNTER — Other Ambulatory Visit (HOSPITAL_COMMUNITY): Payer: Self-pay | Admitting: Pharmacy Technician

## 2021-05-07 ENCOUNTER — Telehealth: Payer: Self-pay | Admitting: Pharmacy Technician

## 2021-05-07 DIAGNOSIS — D509 Iron deficiency anemia, unspecified: Secondary | ICD-10-CM | POA: Insufficient documentation

## 2021-05-07 NOTE — Telephone Encounter (Signed)
Dr. Charlies Silvers,  Juluis Rainier NOTE:  Auth Submission: NO AUTH NEEDED Payer: [employer] Medication & CPT/J Code(s) submitted:  MONOFERRIC - J143  Route of submission (phone, fax, portal): PHONE Auth type: Buy/Bill Units/visits requested: 1 Reference number: [id number] Approval from: 05/07/21 to 06/06/21   Patient will be scheduled as soon as possible. [place].

## 2021-05-11 ENCOUNTER — Encounter (HOSPITAL_COMMUNITY): Payer: Self-pay | Admitting: *Deleted

## 2021-05-11 ENCOUNTER — Encounter: Payer: Self-pay | Admitting: Physician Assistant

## 2021-05-11 ENCOUNTER — Encounter: Payer: Self-pay | Admitting: Orthopedic Surgery

## 2021-05-11 ENCOUNTER — Other Ambulatory Visit: Payer: Self-pay

## 2021-05-11 ENCOUNTER — Emergency Department (HOSPITAL_COMMUNITY): Payer: Medicare Other

## 2021-05-11 ENCOUNTER — Ambulatory Visit (INDEPENDENT_AMBULATORY_CARE_PROVIDER_SITE_OTHER): Payer: Medicare Other | Admitting: Orthopedic Surgery

## 2021-05-11 ENCOUNTER — Inpatient Hospital Stay (HOSPITAL_COMMUNITY)
Admission: EM | Admit: 2021-05-11 | Discharge: 2021-05-14 | DRG: 698 | Disposition: A | Discharge status: Home Health | Payer: Medicare Other | Attending: Family Medicine | Admitting: Family Medicine

## 2021-05-11 DIAGNOSIS — D170 Benign lipomatous neoplasm of skin and subcutaneous tissue of head, face and neck: Secondary | ICD-10-CM | POA: Diagnosis present

## 2021-05-11 DIAGNOSIS — Z951 Presence of aortocoronary bypass graft: Secondary | ICD-10-CM

## 2021-05-11 DIAGNOSIS — N3001 Acute cystitis with hematuria: Secondary | ICD-10-CM | POA: Diagnosis present

## 2021-05-11 DIAGNOSIS — R531 Weakness: Secondary | ICD-10-CM | POA: Diagnosis not present

## 2021-05-11 DIAGNOSIS — N400 Enlarged prostate without lower urinary tract symptoms: Secondary | ICD-10-CM | POA: Diagnosis present

## 2021-05-11 DIAGNOSIS — Z89411 Acquired absence of right great toe: Secondary | ICD-10-CM

## 2021-05-11 DIAGNOSIS — Z20822 Contact with and (suspected) exposure to covid-19: Secondary | ICD-10-CM | POA: Diagnosis present

## 2021-05-11 DIAGNOSIS — R4182 Altered mental status, unspecified: Secondary | ICD-10-CM | POA: Diagnosis present

## 2021-05-11 DIAGNOSIS — G9341 Metabolic encephalopathy: Secondary | ICD-10-CM | POA: Diagnosis present

## 2021-05-11 DIAGNOSIS — N319 Neuromuscular dysfunction of bladder, unspecified: Secondary | ICD-10-CM | POA: Diagnosis present

## 2021-05-11 DIAGNOSIS — Y846 Urinary catheterization as the cause of abnormal reaction of the patient, or of later complication, without mention of misadventure at the time of the procedure: Secondary | ICD-10-CM | POA: Diagnosis present

## 2021-05-11 DIAGNOSIS — T83511A Infection and inflammatory reaction due to indwelling urethral catheter, initial encounter: Principal | ICD-10-CM | POA: Diagnosis present

## 2021-05-11 DIAGNOSIS — Z950 Presence of cardiac pacemaker: Secondary | ICD-10-CM

## 2021-05-11 DIAGNOSIS — K5900 Constipation, unspecified: Secondary | ICD-10-CM | POA: Diagnosis present

## 2021-05-11 DIAGNOSIS — Z87440 Personal history of urinary (tract) infections: Secondary | ICD-10-CM

## 2021-05-11 DIAGNOSIS — E559 Vitamin D deficiency, unspecified: Secondary | ICD-10-CM | POA: Diagnosis present

## 2021-05-11 DIAGNOSIS — N390 Urinary tract infection, site not specified: Secondary | ICD-10-CM

## 2021-05-11 DIAGNOSIS — D509 Iron deficiency anemia, unspecified: Secondary | ICD-10-CM | POA: Diagnosis present

## 2021-05-11 DIAGNOSIS — F17200 Nicotine dependence, unspecified, uncomplicated: Secondary | ICD-10-CM | POA: Diagnosis present

## 2021-05-11 DIAGNOSIS — I69354 Hemiplegia and hemiparesis following cerebral infarction affecting left non-dominant side: Secondary | ICD-10-CM

## 2021-05-11 DIAGNOSIS — Z880 Allergy status to penicillin: Secondary | ICD-10-CM

## 2021-05-11 DIAGNOSIS — F940 Selective mutism: Secondary | ICD-10-CM | POA: Diagnosis present

## 2021-05-11 DIAGNOSIS — T68XXXA Hypothermia, initial encounter: Secondary | ICD-10-CM

## 2021-05-11 DIAGNOSIS — I495 Sick sinus syndrome: Secondary | ICD-10-CM | POA: Diagnosis present

## 2021-05-11 DIAGNOSIS — H518 Other specified disorders of binocular movement: Secondary | ICD-10-CM | POA: Diagnosis present

## 2021-05-11 DIAGNOSIS — I2510 Atherosclerotic heart disease of native coronary artery without angina pectoris: Secondary | ICD-10-CM | POA: Diagnosis present

## 2021-05-11 DIAGNOSIS — Z79899 Other long term (current) drug therapy: Secondary | ICD-10-CM

## 2021-05-11 DIAGNOSIS — F0393 Unspecified dementia, unspecified severity, with mood disturbance: Secondary | ICD-10-CM | POA: Diagnosis present

## 2021-05-11 DIAGNOSIS — E876 Hypokalemia: Secondary | ICD-10-CM | POA: Diagnosis present

## 2021-05-11 DIAGNOSIS — Z6820 Body mass index (BMI) 20.0-20.9, adult: Secondary | ICD-10-CM

## 2021-05-11 DIAGNOSIS — R700 Elevated erythrocyte sedimentation rate: Secondary | ICD-10-CM | POA: Diagnosis present

## 2021-05-11 DIAGNOSIS — I5022 Chronic systolic (congestive) heart failure: Secondary | ICD-10-CM | POA: Diagnosis present

## 2021-05-11 DIAGNOSIS — G928 Other toxic encephalopathy: Secondary | ICD-10-CM

## 2021-05-11 DIAGNOSIS — Z1623 Resistance to quinolones and fluoroquinolones: Secondary | ICD-10-CM | POA: Diagnosis present

## 2021-05-11 DIAGNOSIS — I70221 Atherosclerosis of native arteries of extremities with rest pain, right leg: Secondary | ICD-10-CM | POA: Diagnosis present

## 2021-05-11 DIAGNOSIS — Z8619 Personal history of other infectious and parasitic diseases: Secondary | ICD-10-CM

## 2021-05-11 DIAGNOSIS — F32A Depression, unspecified: Secondary | ICD-10-CM | POA: Diagnosis present

## 2021-05-11 DIAGNOSIS — E785 Hyperlipidemia, unspecified: Secondary | ICD-10-CM | POA: Diagnosis present

## 2021-05-11 DIAGNOSIS — I959 Hypotension, unspecified: Secondary | ICD-10-CM | POA: Diagnosis present

## 2021-05-11 DIAGNOSIS — Z7902 Long term (current) use of antithrombotics/antiplatelets: Secondary | ICD-10-CM

## 2021-05-11 DIAGNOSIS — I10 Essential (primary) hypertension: Secondary | ICD-10-CM | POA: Diagnosis present

## 2021-05-11 DIAGNOSIS — R471 Dysarthria and anarthria: Secondary | ICD-10-CM | POA: Diagnosis present

## 2021-05-11 DIAGNOSIS — I110 Hypertensive heart disease with heart failure: Secondary | ICD-10-CM | POA: Diagnosis present

## 2021-05-11 DIAGNOSIS — A4152 Sepsis due to Pseudomonas: Secondary | ICD-10-CM | POA: Diagnosis present

## 2021-05-11 DIAGNOSIS — R64 Cachexia: Secondary | ICD-10-CM | POA: Diagnosis present

## 2021-05-11 LAB — TYPE AND SCREEN
ABO/RH(D): A NEG
Antibody Screen: NEGATIVE

## 2021-05-11 LAB — RESP PANEL BY RT-PCR (FLU A&B, COVID) ARPGX2
Influenza A by PCR: NEGATIVE
Influenza B by PCR: NEGATIVE
SARS Coronavirus 2 by RT PCR: NEGATIVE

## 2021-05-11 LAB — URINALYSIS, ROUTINE W REFLEX MICROSCOPIC
Bilirubin Urine: NEGATIVE
Glucose, UA: NEGATIVE mg/dL
Ketones, ur: NEGATIVE mg/dL
Nitrite: POSITIVE — AB
Protein, ur: 30 mg/dL — AB
Specific Gravity, Urine: 1.025 (ref 1.005–1.030)
pH: 6 (ref 5.0–8.0)

## 2021-05-11 LAB — CBC WITH DIFFERENTIAL/PLATELET
Abs Immature Granulocytes: 0.01 10*3/uL (ref 0.00–0.07)
Basophils Absolute: 0 10*3/uL (ref 0.0–0.1)
Basophils Relative: 1 %
Eosinophils Absolute: 0.1 10*3/uL (ref 0.0–0.5)
Eosinophils Relative: 2 %
HCT: 29.6 % — ABNORMAL LOW (ref 39.0–52.0)
Hemoglobin: 8.6 g/dL — ABNORMAL LOW (ref 13.0–17.0)
Immature Granulocytes: 0 %
Lymphocytes Relative: 43 %
Lymphs Abs: 2.1 10*3/uL (ref 0.7–4.0)
MCH: 23.6 pg — ABNORMAL LOW (ref 26.0–34.0)
MCHC: 29.1 g/dL — ABNORMAL LOW (ref 30.0–36.0)
MCV: 81.3 fL (ref 80.0–100.0)
Monocytes Absolute: 0.4 10*3/uL (ref 0.1–1.0)
Monocytes Relative: 9 %
Neutro Abs: 2.1 10*3/uL (ref 1.7–7.7)
Neutrophils Relative %: 45 %
Platelets: 185 10*3/uL (ref 150–400)
RBC: 3.64 MIL/uL — ABNORMAL LOW (ref 4.22–5.81)
RDW: 17.2 % — ABNORMAL HIGH (ref 11.5–15.5)
WBC: 4.8 10*3/uL (ref 4.0–10.5)
nRBC: 0 % (ref 0.0–0.2)

## 2021-05-11 LAB — I-STAT VENOUS BLOOD GAS, ED
Acid-Base Excess: 5 mmol/L — ABNORMAL HIGH (ref 0.0–2.0)
Bicarbonate: 31.4 mmol/L — ABNORMAL HIGH (ref 20.0–28.0)
Calcium, Ion: 1.35 mmol/L (ref 1.15–1.40)
HCT: 27 % — ABNORMAL LOW (ref 39.0–52.0)
Hemoglobin: 9.2 g/dL — ABNORMAL LOW (ref 13.0–17.0)
O2 Saturation: 99 %
Potassium: 3.6 mmol/L (ref 3.5–5.1)
Sodium: 142 mmol/L (ref 135–145)
TCO2: 33 mmol/L — ABNORMAL HIGH (ref 22–32)
pCO2, Ven: 56.9 mmHg (ref 44.0–60.0)
pH, Ven: 7.35 (ref 7.250–7.430)
pO2, Ven: 140 mmHg — ABNORMAL HIGH (ref 32.0–45.0)

## 2021-05-11 LAB — I-STAT CHEM 8, ED
BUN: 22 mg/dL (ref 8–23)
Calcium, Ion: 1.32 mmol/L (ref 1.15–1.40)
Chloride: 105 mmol/L (ref 98–111)
Creatinine, Ser: 0.9 mg/dL (ref 0.61–1.24)
Glucose, Bld: 124 mg/dL — ABNORMAL HIGH (ref 70–99)
HCT: 26 % — ABNORMAL LOW (ref 39.0–52.0)
Hemoglobin: 8.8 g/dL — ABNORMAL LOW (ref 13.0–17.0)
Potassium: 3.6 mmol/L (ref 3.5–5.1)
Sodium: 142 mmol/L (ref 135–145)
TCO2: 31 mmol/L (ref 22–32)

## 2021-05-11 LAB — RAPID URINE DRUG SCREEN, HOSP PERFORMED
Amphetamines: NOT DETECTED
Barbiturates: NOT DETECTED
Benzodiazepines: NOT DETECTED
Cocaine: NOT DETECTED
Opiates: POSITIVE — AB
Tetrahydrocannabinol: NOT DETECTED

## 2021-05-11 LAB — URINALYSIS, MICROSCOPIC (REFLEX)
Squamous Epithelial / HPF: NONE SEEN (ref 0–5)
WBC, UA: >50 WBC/hpf (ref 0–5)

## 2021-05-11 LAB — COMPREHENSIVE METABOLIC PANEL
ALT: 14 U/L (ref 0–44)
AST: 14 U/L — ABNORMAL LOW (ref 15–41)
Albumin: 3.1 g/dL — ABNORMAL LOW (ref 3.5–5.0)
Alkaline Phosphatase: 126 U/L (ref 38–126)
Anion gap: 6 (ref 5–15)
BUN: 15 mg/dL (ref 8–23)
CO2: 28 mmol/L (ref 22–32)
Calcium: 10.1 mg/dL (ref 8.9–10.3)
Chloride: 106 mmol/L (ref 98–111)
Creatinine, Ser: 0.87 mg/dL (ref 0.61–1.24)
GFR, Estimated: >60 mL/min (ref >60)
Glucose, Bld: 88 mg/dL (ref 70–99)
Potassium: 3.4 mmol/L — ABNORMAL LOW (ref 3.5–5.1)
Sodium: 140 mmol/L (ref 135–145)
Total Bilirubin: 0.3 mg/dL (ref 0.3–1.2)
Total Protein: 6.8 g/dL (ref 6.5–8.1)

## 2021-05-11 LAB — SEDIMENTATION RATE: Sed Rate: 18 mm/hr — ABNORMAL HIGH (ref 0–16)

## 2021-05-11 LAB — LIPASE, BLOOD: Lipase: 29 U/L (ref 11–51)

## 2021-05-11 LAB — TROPONIN I (HIGH SENSITIVITY)
Troponin I (High Sensitivity): 11 ng/L (ref <18)
Troponin I (High Sensitivity): 9 ng/L (ref <18)

## 2021-05-11 LAB — PROTIME-INR
INR: 1.1 (ref 0.8–1.2)
Prothrombin Time: 14.3 seconds (ref 11.4–15.2)

## 2021-05-11 LAB — LACTIC ACID, PLASMA: Lactic Acid, Venous: 1.9 mmol/L (ref 0.5–1.9)

## 2021-05-11 LAB — APTT: aPTT: 30 seconds (ref 24–36)

## 2021-05-11 LAB — TSH: TSH: 0.74 u[IU]/mL (ref 0.350–4.500)

## 2021-05-11 LAB — ETHANOL: Alcohol, Ethyl (B): <10 mg/dL (ref <10)

## 2021-05-11 LAB — AMMONIA: Ammonia: 10 umol/L (ref 9–35)

## 2021-05-11 LAB — C-REACTIVE PROTEIN: CRP: 0.6 mg/dL (ref <1.0)

## 2021-05-11 MED ORDER — SODIUM CHLORIDE 0.9 % IV BOLUS
500.0000 mL | Freq: Once | INTRAVENOUS | Status: AC
  Administered 2021-05-11: 500 mL via INTRAVENOUS

## 2021-05-11 MED ORDER — POTASSIUM CHLORIDE 10 MEQ/100ML IV SOLN
10.0000 meq | INTRAVENOUS | Status: AC
  Administered 2021-05-11 (×2): 10 meq via INTRAVENOUS
  Filled 2021-05-11 (×2): qty 100

## 2021-05-11 MED ORDER — SODIUM CHLORIDE 0.9 % IV SOLN: INTRAVENOUS | Status: AC

## 2021-05-11 MED ORDER — ENOXAPARIN SODIUM 40 MG/0.4ML IJ SOSY
40.0000 mg | PREFILLED_SYRINGE | INTRAMUSCULAR | Status: DC
  Administered 2021-05-11: 40 mg via SUBCUTANEOUS
  Filled 2021-05-11 (×3): qty 0.4

## 2021-05-11 MED ORDER — SODIUM CHLORIDE 0.9 % IV SOLN
2.0000 g | Freq: Two times a day (BID) | INTRAVENOUS | Status: DC
  Administered 2021-05-11 – 2021-05-12 (×3): 2 g via INTRAVENOUS
  Filled 2021-05-11 (×3): qty 2

## 2021-05-11 MED ORDER — SODIUM CHLORIDE 0.9 % IV BOLUS
1000.0000 mL | Freq: Once | INTRAVENOUS | Status: AC
  Administered 2021-05-11: 1000 mL via INTRAVENOUS

## 2021-05-11 MED ORDER — SODIUM CHLORIDE 0.9 % IV SOLN
1.0000 g | Freq: Once | INTRAVENOUS | Status: AC
  Administered 2021-05-11: 1 g via INTRAVENOUS
  Filled 2021-05-11: qty 10

## 2021-05-11 NOTE — Progress Notes (Signed)
  FMTS Attending Admission Note: Todd Savannah, MD  Personal pager:  [phone] FPTS Service Pager:  [phone]   I  have seen and examined this patient, reviewed their chart. I have discussed this patient with the resident. I agree with the resident's findings, assessment and care plan.  Todd Dunn is a 80 yo M who presents from orthopedic office today with hypotension and hypothermia. At time of my exam, no one else is at bedside.  He denies any pain to me.  He denies any fevers or chills.  He states that he is hungry and wants to eat.  Heart regular rate and rhythm, lungs clear to auscultation bilaterally.  Abdomen soft.  No edema.  Extraocular movement intact, sensation and touch to bilateral face and upper and lower extremities.  Strength 4 out of 5 in all 4 extremities.  No focal neurodeficits appreciated.  Speech dysarthric but no family present to discuss if this is his baseline.  Imaging and labs reviewed.  Acute metabolic encephalopathy - suspect in setting of sepsis, with hypothermia and hypotension. Other ddx includes CVA although he has no focal neuro deficits. He has grown a variety of organisms in his urine, we will cover with cefepime due to his h/o Pseudomonas, blood cultures pending. If worsening clinically or spikes fever consider broadening to cover history of VRE/ESBL. Continue mIVF. Hypotension hypothermia-suspect in setting of infection.  Hold home antihypertensives.  Continue maintenance fluids.  Continue Retail banker.  Resident H&P to follow.

## 2021-05-11 NOTE — ED Notes (Signed)
Pt incontinent of BM, cleaned pt and applied a clean brief.

## 2021-05-11 NOTE — ED Provider Notes (Signed)
Emergency Medicine Provider Triage Evaluation Note  Todd Dunn , a 80 y.o. male  was evaluated in triage.  He presents to the emergency department with generalized weakness and hypotension.  Blood pressure was 79/47 on arrival.  He has been lethargic.  He only nods yes and no to my questions.  He nods no to having any pain.    He is very lethargic. He has been nonverbal. Seems to be following basic commands. He does have a right gaze deviation of his right eye.  He is on Plavix at home.  Recommend getting next room.  Placed altered LOC labs.  CT head without contrast.  Blood cultures ordered.  Review of Systems  Positive:  Negative:   Physical Exam  BP (!) 79/47 (BP Location: Left Arm)   Pulse 64   Resp 16   SpO2 99%  Gen:   Awake, no distress   Resp:  Normal effort  MSK:   Moves extremities without difficulty  Other:    Medical Decision Making  Medically screening exam initiated at 11:36 AM.  Appropriate orders placed.  Todd Dunn was informed that the remainder of the evaluation will be completed by another provider, this initial triage assessment does not replace that evaluation, and the importance of remaining in the ED until their evaluation is complete.     Sheila Oats 05/11/21 1142    Davonna Belling, MD 05/12/21 1239

## 2021-05-11 NOTE — ED Notes (Signed)
Placed pt on bair hugger 

## 2021-05-11 NOTE — ED Triage Notes (Signed)
Pt arrived for weakness and low bp. Bp 79/47 on arrival. Pt lethargic at triage, denies any pain.

## 2021-05-11 NOTE — Hospital Course (Addendum)
Todd Dunn is a 80 y.o. male presenting with Weakness and Hypotension who was admitted to the [facility] at [facility] for management of complicated UTI sepsis. 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 Others: BPH, Vit D deficiency  ED Course: Hypotensive and hypothermic on arrival, so placed in [facility]. On exam his speech was latent and garbled. Mentation improved after some time in bair hugger and hypotension is responsive to IVF. CBC and CMP unremarkable aside from chronic anemia. No leucocytosis, lactic acid nrml, but UA is consistent with UTI. Blood cx x2 obtained, then administered ceftriaxone 1g x1 for concern of infection. EKG similar to previous with junctional rhythm, left ventricular hypertrophy with repolarization abnormality. CXR neg for active dz, NCHCT neg for acute findings, right foot xray showed no evidence of osteomyelitis.  Brief Hospital Course: Home tamsulosin, Lasix and amlodipine were held were held given hypotension, hypothermia, and AMS. Patient was given fluid bolus and he received IV cefepime, Rocephin, and Zosyn (4 days on antimicobial). For urine cx growing pseudomonas (resistance to ciprofloxacin) and VRE.  Patient was discharged on fosfomycin x1 dose and amoxicillin x5 days.  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 CXR (-) and NCHCT (-) for acute processes. UTI treatment per below. Aspiration, delirium, fall precautions SLP/PT/OT eval Bair hugger Consider MoCA  Complicated UTI  neurogenic bladder with chronic indwelling Foley catheter Patient endorsed  dysuria, with hypotension and hypothermia concerning for sepsis.  Urine culture grew Pseudomonas and VRE.  That are vancomycin and ciprofloxacin resistant.  Concern for possible prostatitis given patient's age and recurrent UTI.  He refused DRE during hospitalization, please consider prostatitis outpatient.  Patient will require close follow-up, with daily monitoring at home.  Patient lives with a lot of family, the main caretaker is his granddaughter [name]). 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 Sharol Given, has a follow-up appointment on 12/12.  Foot XR (-) for osteomyelitis.  CRP WNL and sed rate 18. Wound care consulted with daily dressing changes Follow up with outpatient orthopedic  Chronic anemia, stable Patient follows Dr. Lorenso Courier (heme-onc), who suspects its multifactorial from vitamin B12 deficiency and chronic disease leading iron-deficiency.  Hb 8.3 on BMP, 7.5 on H/H.  At baseline Hb 8-9. Low reticulocyte 21.1, low iron 20, low saturation ratio 6%, low ferritin 15, (11/29). Per chart, patient is hesitant to start PO iron supplement due to constipation. Per family, patient receives regular IV iron infusions.  No transfusion required during hospitalization. Vitamin B12 1000 mcg daily Follow-up with PCP and heme-onc  CAD/CABG 2014  hx of CVA with residual left hemiplegia  PAD s/p right great toe amputation Plavix 75 mg Rosuvastatin 10 mg  HFrEF (EF 25-35%, Oct 2022) SSS s/p MDT ICD Junctional rhythm on admission EKG. Home Lasix 20 mg.  Patient has been euvolemic during hospitalization.  Strict I/Os and daily weights Held home Lasix 20 mg given hypotension per above  BPH BP has  normalized and is currently stable. D/c'd home tamsulosin 0.4 mg daily but keep  finasteride as patient has indwelling foley, that is likely to remain long-term given neurogenic bladder and failed trials in the past. Per urology, patient's tamsulosin is ok to be discontinued as patient will keep the foley long-term, but to keep the finasteride to keep prostate smaller for easier foley changes.  Finasteride 5 mg daily DC tamsulosin   HTN Amlodipine 10 mg daily at home, we will continue to hold in the setting of hypotension and hypothermia per above. Held home amlodipine per above  Other chronic conditions were medically managed with home medications and formulary alternatives as necessary   PCP Follow-up Recommendations: Complicated UTI, hx of ESBL and VRE. Urine cx grew Pseudomonas and VRE that were resistant to ciprofloxacin and vanc.  DC'd on fosfomycin x1 dose and amoxicillin x5 days. Consider prostate check to monitor for possible prostatitis. Patient declined during hospitalization. Follow-up with Dr. Lorenso Courier (heme-onc) for Chronic anemia and regular IV iron treatments Needs close follow-up with urology for UTI and Foley Hypotensive on admission, held home amlodipine 10 mg daily and Lasix 20 mg, please reevaluate.  Patient was euvolemic during hospitalization. Follow-up with Dr. Sharol Given for amputation follow-up which was stable during hospitalization. Possible mild cognitive impairment, consider MoCA  Significant Procedures:  None

## 2021-05-11 NOTE — Progress Notes (Signed)
Office Visit Note   Patient: Todd Dunn           Date of Birth: [birth year]           MRN: [id number] Visit Date: 05/11/2021              Requested by: Fredrich Romans, [place] [phone] [address] [place],  [place] [address] PCP: Fredrich Romans, PA  Chief Complaint  Patient presents with   Right Foot - Routine Post Op    Right GT amputation 04/01/21      HPI: Patient is a 80 year old gentleman who presents status post right great toe amputation.  He is currently in a fracture boot.  Assessment & Plan: Visit Diagnoses:  1. Right great toe amputee ([place])     Plan: Continue with Dial soap cleansing daily continue with dry dressing plus an Ace wrap.  Continue with the boot and elevation to minimize swelling.  Follow-Up Instructions: Return in about 4 weeks (around 06/08/2021).   Ortho Exam  Patient is alert, oriented, no adenopathy, well-dressed, normal affect, normal respiratory effort. Examination patient has minimal swelling there is hypergranulation tissue in the wound which measures 2 x 1 cm and 2 mm deep this was touched with silver nitrate a dry dressing was applied.  Imaging: No results found. No images are attached to the encounter.  Labs: Lab Results  Component Value Date   HGBA1C 5.6 12/16/2020   ESRSEDRATE 22 (H) 03/20/2021   CRP 1.1 (H) 03/20/2021   REPTSTATUS 03/23/2021 FINAL 03/20/2021   GRAMSTAIN  03/20/2021    NO WBC SEEN RARE GRAM POSITIVE COCCI RARE GRAM POSITIVE RODS    CULT 70,000 COLONIES/mL PROTEUS MIRABILIS (A) 03/20/2021   LABORGA PROTEUS MIRABILIS (A) 03/20/2021     Lab Results  Component Value Date   ALBUMIN 3.2 (L) 05/05/2021   ALBUMIN 2.8 (L) 03/21/2021   ALBUMIN 3.2 (L) 03/20/2021   PREALBUMIN 15.7 (L) 03/20/2021    Lab Results  Component Value Date   MG 1.9 04/02/2021   MG 2.1 02/16/2021   No results found for: VD25OH  Lab Results  Component Value Date   PREALBUMIN 15.7 (L) 03/20/2021   CBC EXTENDED Latest Ref Rng &  Units 05/05/2021 05/05/2021 04/06/2021  WBC 4.0 - 10.5 K/uL 3.9(L) - -  RBC 4.22 - 5.81 MIL/uL 3.51(L) 3.52(L) -  HGB 13.0 - 17.0 g/dL 8.4(L) - 8.8(L)  HCT 39.0 - 52.0 % 27.8(L) - 26.0(L)  PLT 150 - 400 K/uL 181 - -  NEUTROABS 1.7 - 7.7 K/uL 1.8 - -  LYMPHSABS 0.7 - 4.0 K/uL 1.7 - -     There is no height or weight on file to calculate BMI.  Orders:  No orders of the defined types were placed in this encounter.  No orders of the defined types were placed in this encounter.    Procedures: No procedures performed  Clinical Data: No additional findings.  ROS:  All other systems negative, except as noted in the HPI. Review of Systems  Objective: Vital Signs: There were no vitals taken for this visit.  Specialty Comments:  No specialty comments available.  PMFS History: Patient Active Problem List   Diagnosis Date Noted   Iron deficiency anemia 05/07/2021   Oral phase dysphagia 04/28/2021   Protein-calorie malnutrition, severe 03/27/2021   Toe infection    PVD (peripheral vascular disease) ([place])    Osteomyelitis of great toe of right foot ([place]) 03/20/2021   Wound infection 03/20/2021   Pacemaker 03/20/2021  Elevated alkaline phosphatase level 03/20/2021   Hyperlipidemia 03/20/2021   Hematuria 02/14/2021   Acute cystitis with hematuria    Normocytic anemia 01/06/2021   Catheter-associated urinary tract infection ([place]) 14/43/1540   Acute metabolic encephalopathy 08/67/6195   Chronic indwelling Foley catheter 12/15/2020   HTN (hypertension) 12/15/2020   History of ESBL E. coli infection 12/15/2020   BPH (benign prostatic hyperplasia) 09/32/6712   Chronic systolic heart failure ([place]) 12/12/2019   Past Medical History:  Diagnosis Date   BPH (benign prostatic hyperplasia)    CAD (coronary artery disease)    Chronic indwelling Foley catheter    CVA (cerebral vascular accident) ([place])    HTN (hypertension)    Neurogenic bladder    SSS (sick sinus syndrome) ([place])     s/p MDT ICD   Vitamin D deficiency     Family History  Family history unknown: Yes    Past Surgical History:  Procedure Laterality Date   ABDOMINAL AORTOGRAM W/LOWER EXTREMITY N/A 03/23/2021   Procedure: ABDOMINAL AORTOGRAM W/LOWER EXTREMITY;  Surgeon: Waynetta Sandy, MD;  Location: [facility];  Service: Cardiovascular;  Laterality: N/A;   AMPUTATION Right 04/01/2021   Procedure: RIGHT GREAT TOE AMPUTATION;  Surgeon: Newt Minion, MD;  Location: [facility];  Service: Orthopedics;  Laterality: Right;   APPENDECTOMY     APPLICATION OF WOUND VAC Right 04/01/2021   Procedure: APPLICATION OF WOUND VAC;  Surgeon: Newt Minion, MD;  Location: [facility];  Service: Orthopedics;  Laterality: Right;   CARDIAC SURGERY     FEMORAL-TIBIAL BYPASS GRAFT Right 03/26/2021   Procedure: BYPASS GRAFT FEMORAL-TIBIAL ARTERY.  Harvest right leg saphenous vein, right vein patch angioplasty posterior tibial.;  Surgeon: Marty Heck, MD;  [facility];  Service: Vascular;  Laterality: Right;   PACEMAKER PLACEMENT     Social History   Occupational History   Not on file  Tobacco Use   Smoking status: Not on file   Smokeless tobacco: Never  Substance and Sexual Activity   Alcohol use: Not Currently   Drug use: Never   Sexual activity: Not on file

## 2021-05-11 NOTE — H&P (Addendum)
[facility] Admission History and Physical Service Pager: [phone]  Patient name: Todd Dunn Medical record number: [id number] Date of birth: May 19, 1941 Age: 80 y.o. Gender: male  Primary Care Provider: Fredrich Dunn, [place] Consultants: None  Code Status: Full which was confirmed with family if patient unable to confirm  Preferred Emergency Contact:  Contact Information     Name Relation Home Work Mobile   Todd Dunn Granddaughter [phone]     Todd Dunn   [id number]       Chief Complaint: AMS and lethargy   Assessment and Plan: Todd Dunn is a 80 y.o. male presenting with weakness, lethargy, hypotension, hypothermia. 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  Acute Encephalopathy  Lethargy Patient was initially altered and lethargic per family members with hypotension with a systolic of 80 that prompted them to bring him to the ER.  On arrival, systolic BP of 79, hypothermic with temperature of . Labs were significant for lactic acid of 1.9, lipase 29, troponin 11, K 3.4, albumin 3.1, ammonia 10, Hgb 8.8.UA showed moderate hgb, positive nitrites, small leukocytes, and 30 protein.  UDS positive for opiates, per med history he has Norco at home.  Ethanol <10. VBG showed PO2 of 140 bicarb of 31 with PCO2 of 56.9. PT/INR and APTT normal.  Patient has a history of right great toe amputation due to osteomyelitis and was negative for acute findings.  CXR negative. Head CT showed  chronic small vessel change but no acute findings. Given patient's presentation, blood and urine cultures were obtained and patient received 1L bolus of NS and was given 1 dose of CTX. On exam, patient not responding verbally but does follow most commands. Ddx is broad and includes: infection (will follow up cultures and evaluate for osteomyelitis with labs), hypoglycemia (unlikely given CBG of 88), opiate overdose (unlikely as family manages very closely), trauma, hypothyroidism (will obtain TSH), adrenal insufficiency (can consider cortisol level.  He has more than one nidus for infection with recent osteomyelitis (s/p amputation) and indwelling Foley catheter with history of ESBL, proteus, and VRE.   - Admitted to progressive floor, FPTS attending Dr. Thompson Dunn - Ordered TSH, ESR, CRP - Follow urine culture and blood culture x2 - Daily CBC and BMP -- CRM -- Regular CBG monitoring  -- Start with cefepime 2g q6hr -- Vital signs every 4 - Bair hugger - Maintenance fluids as below - Will administer a bolus of 587m NS - Continuous pulse ox  Hypotension  Hypothermia  Patient initially presented with hypotension with a systolic of 79 and was hypothermic to 95.60F.  He was also acutely encephalopathic.  In response, to this the ED gave 1 L bolus normal saline and put the patient in a biar hugger.  High likelihood that this is related to systemic infection (possibly UTI based on UA and extensive UTI history).  Hypotension responded initially to the fluids to the low 100s over 60s.  Has now gone back down to the 912Wsystolic.  The bear hugger has provided some support with an increased temperature to 96.1. - Continue with mIVF NS100 ml/hour -  Continuing with another bolus of NS 500 mls - Continue with bear hugger - Monitor vital signs closely  Hypokalemia K 3.4 on admission. - Repleted with 32mq - Follow-up AM BMP  OM of the right foot requiring right great toe amputation  Patient has history of  osteomyelitis with right great toe amputation and had no problems with surgical intervention. After rehabilitation, he was initially doing fairly well at home. - CRP and ESR ordered - Photo included below  Neurogenic bladder with chronic indwelling foley catheter  Patient has had multiple UTIs with his chronic indwelling Foley catheter as mentioned above the patient has had urinalysis that showed moderate hemoglobin, positive nitrites, small amount of leukocytes, and 30 protein.  He has history of Proteus, VRE, ESBL UTIs.  - Continue with cefepime - Follow-up urine and blood culture - S/p 1 dose of Rocephin  IDA Patient has iron deficiency anemia and has baseline hgb around 8-9 range. Has scheduled iron infusions per granddaughter further. - Daily CBCs -Transfuse less than 8 - Follow-up iron transfusion schedule  HFrEF Echo performed in October 2022 shows left ventricular ejection fraction by estimation of 30 to 35% and moderately decreased function with global hypokinesis. Internal cavity was mildly dilated. Grade 1 diastolic dysfunction. No current concern for fluid overload.  - Monitor fluid status - Strict ins and outs - Daily weight  CAD/CABG performed in 2014  Home dose of Plavix 75 mg daily, rosuvastatin 10 mg.  Still need reviewing and med reconciliation - Consider restarting after med reconciliation  History of sick sinus syndrome with pacemaker  Patient in junctional rhythm on EKG.  History of CVA with residual left hemiplegia  Home dose of Plavix 75 mg daily.  Depression/Anxiety  Patient has history of depression and home dose of Celexa 20 mg. - Consider restarting after med reconciliation  PAD with critical limb ischemia  Home dose of plavix, needs med rec.   HTN  Hypotensive on admission. Holding home Amlodipine 10 mg daily  FEN/GI: NPO Prophylaxis: Lovenox   Disposition: Progressive   History of Present Illness:  Todd BihmPKulishis a 80y.o. male  presenting with altered mental status and lethargy this morning 12/5.  Patient was not responsive to questions and would not answer any orientation questions.  He did say that he needed his coat and wallet, but that was all he would speak to [place].  Contacted granddaughter, SBernadene Bell and she provided a history for the patient.  This morning, per family members, the patient did not seem to be at baseline.  He did not want any coffee nor did he want breakfast and seemingly was not acting like himself.  He went to the foot doctor, and he began falling asleep, was unable to stay awake.  Granddaughter also says that he began drooling which prompted her to tell them to get a blood pressure.  At that time blood pressure was very low and he was taken to the emergency room.  At baseline, he does seem to interact with people and can understand what people are saying.  The patient responds in short sentences and can generally be understood, but he sometimes has garbled speech.  He lives at home with granddaughter and multiple family members who all help out with his care.  Nurses visit him as well.  His granddaughter and son-in-law assist him with his medications and his granddaughter sorts them all out on it daily basis.  His granddaughter saw him yesterday and reported  a normal baseline at that time.  In October 2022, he had bad infection and had toe amputation after bypass in the leg was attempted and failed. Right toe was amputated, and he was set up in rehabilitation for a couple of weeks.  The patient was unable to return home.  He also has an indwelling Foley catheter that can be infected at times causing a UTI.  Patient also has iron deficiency anemia and has started receiving iron infusions with the next one in the next week.  Review Of Systems: Difficulty with ROS given patient's state. Per HPI with the following additions:   Review of Systems  Unable to perform ROS: Mental status change   Constitutional:  Positive for activity change. Negative for fever.  Psychiatric/Behavioral:         Lethargic and somnolent initially    Patient Active Problem List   Diagnosis Date Noted   AMS (altered mental status) 05/11/2021   Hypotension 05/11/2021   Iron deficiency anemia 05/07/2021   Oral phase dysphagia 04/28/2021   Protein-calorie malnutrition, severe 03/27/2021   Toe infection    PVD (peripheral vascular disease) ([place])    Osteomyelitis of great toe of right foot ([place]) 03/20/2021   Wound infection 03/20/2021   Pacemaker 03/20/2021   Elevated alkaline phosphatase level 03/20/2021   Hyperlipidemia 03/20/2021   Hematuria 02/14/2021   Acute cystitis with hematuria    Normocytic anemia 01/06/2021   Catheter-associated urinary tract infection ([place]) 65/53/7482   Acute metabolic encephalopathy 70/78/6754   Chronic indwelling Foley catheter 12/15/2020   HTN (hypertension) 12/15/2020   History of ESBL E. coli infection 12/15/2020   BPH (benign prostatic hyperplasia) 49/20/1007   Chronic systolic heart failure ([place]) 12/12/2019    Past Medical History: Past Medical History:  Diagnosis Date   BPH (benign prostatic hyperplasia)    CAD (coronary artery disease)    Chronic indwelling Foley catheter    CVA (cerebral vascular accident) ([place])    HTN (hypertension)    Neurogenic bladder    SSS (sick sinus syndrome) ([place])    s/p MDT ICD   Vitamin D deficiency     Past Surgical History: Past Surgical History:  Procedure Laterality Date   ABDOMINAL AORTOGRAM W/LOWER EXTREMITY N/A 03/23/2021   Procedure: ABDOMINAL AORTOGRAM W/LOWER EXTREMITY;  Surgeon: Waynetta Sandy, MD;  Location: [facility];  Service: Cardiovascular;  Laterality: N/A;   AMPUTATION Right 04/01/2021   Procedure: RIGHT GREAT TOE AMPUTATION;  Surgeon: Newt Minion, MD;  Location: [facility];  Service: Orthopedics;  Laterality: Right;   APPENDECTOMY     APPLICATION OF WOUND VAC Right 04/01/2021    Procedure: APPLICATION OF WOUND VAC;  Surgeon: Newt Minion, MD;  Location: [facility];  Service: Orthopedics;  Laterality: Right;   CARDIAC SURGERY     FEMORAL-TIBIAL BYPASS GRAFT Right 03/26/2021   Procedure: BYPASS GRAFT FEMORAL-TIBIAL ARTERY.  Harvest right leg saphenous vein, right vein patch angioplasty posterior tibial.;  Surgeon: Marty Heck, MD;  Location: [facility];  Service: Vascular;  Laterality: Right;   PACEMAKER PLACEMENT      Social History: Social History   Tobacco Use   Smokeless tobacco: Never  Substance Use Topics   Alcohol use: Not Currently   Drug use: Never   Additional social history: Lives home with his granddaughter and has nursing visits regularly.  Please also refer to relevant sections of EMR.  Family History: Family History  Family history unknown: Yes    Allergies and Medications:  Allergies  Allergen Reactions   Food Anaphylaxis and Rash    Clams   Aspirin Itching   [place] Clam (M. [place]) Skin Test Itching   Penicillins Itching   No current facility-administered medications on file prior to encounter.   Current Outpatient Medications on File Prior to Encounter  Medication Sig Dispense Refill   acetaminophen (TYLENOL) 325 MG tablet Take 650 mg by mouth every 4 (four) hours as needed for mild pain.     amLODipine (NORVASC) 10 MG tablet Take 10 mg by mouth daily.     bisacodyl (DULCOLAX) 5 MG EC tablet Take 1 tablet (5 mg total) by mouth daily as needed for moderate constipation. 30 tablet 0   citalopram (CELEXA) 20 MG tablet Take 20 mg by mouth at bedtime.     clopidogrel (PLAVIX) 75 MG tablet Take 75 mg by mouth at bedtime.     docusate sodium (COLACE) 100 MG capsule Take 1 capsule (100 mg total) by mouth every 12 (twelve) hours. 60 capsule 0   doxycycline (VIBRA-TABS) 100 MG tablet Take 1 tablet (100 mg total) by mouth 2 (two) times daily. 60 tablet 0   finasteride (PROSCAR) 5 MG tablet Take 5 mg by mouth daily.      furosemide (LASIX) 20 MG tablet Take 20 mg by mouth daily.     hydrALAZINE (APRESOLINE) 50 MG tablet Take 50 mg by mouth 2 (two) times daily. (Patient not taking: Reported on 05/05/2021)     HYDROcodone-acetaminophen (NORCO/VICODIN) 5-325 MG tablet Take 1-2 tablets by mouth every 4 (four) hours as needed for moderate pain (pain score 4-6). (Patient not taking: Reported on 05/05/2021) 10 tablet 0   isosorbide mononitrate (IMDUR) 60 MG 24 hr tablet Take 60 mg by mouth daily.     nitroGLYCERIN (NITRODUR - DOSED IN MG/24 HR) 0.2 mg/hr patch Place 1 patch (0.2 mg total) onto the skin daily. (Patient not taking: Reported on 05/05/2021) 30 patch 0   nitroGLYCERIN (NITROSTAT) 0.4 MG SL tablet Place 0.4 mg under the tongue every 5 (five) minutes as needed for chest pain.     pantoprazole (PROTONIX) 40 MG tablet Take 1 tablet (40 mg total) by mouth daily. 30 tablet 1   rosuvastatin (CRESTOR) 10 MG tablet Take 10 mg by mouth daily.     tamsulosin (FLOMAX) 0.4 MG CAPS capsule Take 0.4 mg by mouth daily.     vitamin B-12 (CYANOCOBALAMIN) 1000 MCG tablet Take 1 tablet (1,000 mcg total) by mouth daily. 30 tablet 3   Vitamin D, Ergocalciferol, (DRISDOL) 1.25 MG (50000 UNIT) CAPS capsule Take 50,000 Units by mouth every Monday.      Objective: BP (!) 100/45   Pulse 67   Temp (!) 97.3 F (36.3 C) (Rectal)   Resp (!) 24   SpO2 100%  Exam: General: NAD, elderly man resting in bear hugger  Eyes: PERRL Neck: No LAD Cardiovascular: RRR, no m/g/r  Respiratory: Normal WOB Gastrointestinal: Nondistended, nontender,  MSK:  Amputation of the right great toe, no evident fluctuance, erythema, ttp, drainage, normal DP pulses    Derm: moderate sloughing of skin of BLE, pacemaker under the skin in chest  Neuro: Able to open eyes and has some difficult to understand, short sentences. Able to understand when spoken to  Psych: no signs of agitated or inappropriate behavior   Labs and Imaging: CBC BMET  Recent Labs   Lab 05/11/21 1145 05/11/21 1308 05/11/21 1309  WBC 4.8  --   --   HGB  8.6*   < > 8.8*  HCT 29.6*   < > 26.0*  PLT 185  --   --    < > = values in this interval not displayed.   Recent Labs  Lab 05/11/21 1145 05/11/21 1308 05/11/21 1309  NA 140   < > 142  K 3.4*   < > 3.6  CL 106  --  105  CO2 28  --   --   BUN 15  --  22  CREATININE 0.87  --  0.90  GLUCOSE 88  --  124*  CALCIUM 10.1  --   --    < > = values in this interval not displayed.     EKG: My own interpretation (not copied from electronic read) LVH, junctional rhythm  CT Head Wo Contrast  Result Date: 05/11/2021 CLINICAL DATA:  Mental status change of unknown cause. Weakness and hypotension EXAM: CT HEAD WITHOUT CONTRAST TECHNIQUE: Contiguous axial images were obtained from the base of the skull through the vertex without intravenous contrast. COMPARISON:  04/06/2021 FINDINGS: Brain: Age related volume loss. No focal abnormality seen affecting the brainstem or cerebellum. Chronic small-vessel ischemic changes affect the cerebral hemispheric white matter. Old small vessel infarction of the right thalamus. No sign of acute infarction, mass lesion, hemorrhage, hydrocephalus or extra-axial collection. Vascular: There is atherosclerotic calcification of the major vessels at the base of the brain. Skull: Negative Sinuses/Orbits: Clear/normal Other: None IMPRESSION: No acute CT finding. Atrophy and chronic small-vessel ischemic change of the cerebral hemispheric white matter. Cerebral Atrophy (ICD10-G31.9). Electronically Signed   By: Nelson Chimes M.D.   On: 05/11/2021 12:57   DG Chest Portable 1 View  Result Date: 05/11/2021 CLINICAL DATA:  Hypotension.  Shortness of breath. EXAM: PORTABLE CHEST 1 VIEW COMPARISON:  03/22/2021 FINDINGS: The heart size and mediastinal contours are within normal limits. Aortic atherosclerotic calcification noted. Prior CABG again noted. Dual lead transvenous pacemaker remains in appropriate  position. Both lungs are clear. Skin fold again seen projecting over the right hemithorax. The visualized skeletal structures are unremarkable. IMPRESSION: No active disease. Electronically Signed   By: Marlaine Hind M.D.   On: 05/11/2021 12:40   DG Foot Complete Right  Result Date: 05/11/2021 CLINICAL DATA:  Possible sepsis.  Evaluate for osteomyelitis. 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 Calcaneal and Achilles spurs. No osseous destruction. No soft tissue gas. IMPRESSION: No evidence of acute osteomyelitis. Status post first phalanx and metatarsal amputation with similar hyperattenuation in the soft tissues of the surgical site. 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 This could be correlated with physical exam. Electronically Signed   By: Abigail Miyamoto M.D.   On: 05/11/2021 13:52    ECHO Oct 2022:  IMPRESSIONS Left ventricular ejection fraction, by estimation, is 30 to 35%. The left ventricle has moderately decreased function. The left ventricle demonstrates global hypokinesis. The left ventricular internal cavity size was mildly dilated. Left ventricular diastolic parameters are consistent with Grade I diastolic dysfunction (impaired relaxation). 1. Right ventricular systolic function is normal. The right ventricular size is normal. There is mildly elevated pulmonary artery systolic pressure. The estimated right ventricular systolic pressure is 86.7 mmHg. 2. 3. Left atrial size was moderately dilated. 4. Right atrial size was moderately dilated. The mitral valve is normal  in structure. Trivial mitral valve regurgitation. No evidence of mitral stenosis. 5. 6. Tricuspid valve regurgitation is moderate. The aortic valve is tricuspid. Aortic valve  regurgitation is not visualized. Mild to moderate aortic valve sclerosis/calcification is present, without any evidence of aortic stenosis. 7. The inferior vena cava is dilated in size with >50% respiratory variability, suggesting right atrial pressure of 8 mmHg.   Erskine Emery, MD 05/11/2021, 4:58 PM PGY-1, [facility] pager: [phone], text pages welcome     FPTS Upper-Level Resident Addendum   I have independently interviewed and examined the patient. I have discussed the above with the original author and agree with their documentation. My edits for correction/addition/clarification are in within the document. Please see also any attending notes.   Rise Patience, DO  PGY-2, [facility] 05/11/2021 5:36 PM  FPTS Service pager: [phone] (text pages welcome through [facility])

## 2021-05-11 NOTE — Progress Notes (Signed)
FPTS Brief Progress Note  S: Patient seen at bedside for evening rounds. He is awake, alert, responding verbally although his speech is difficult to understand. Patient has no specific complaints other than being hungry.  O: BP 136/60   Pulse 66   Temp (!) 97.1 F (36.2 C) (Rectal)   Resp 13   SpO2 100%   Gen: alert, no acute distress Resp: normal work of breathing Neuro: moves all extremities equally, oriented to self and place, states the month is his birthday month (he can tell me his DOB is 06/28/1940), unable to state the year. Speech is difficult to understand  A/P: Hypotension Resolved. BP has been consistently in the 676H systolic with MAPs 20N-47S. -Monitor BP -IV fluids decreased to 50cc/hr (hx of HFrEF, 30-35% in October 2022)  Hypothermia Improving, most recent temp 97.22F -Remains on bair hugger  Acute Encephalopathy Appears to be improving from admission, although he remains somewhat confused. Continue plan per H&P- f/u labs and cultures, continue Cefepime.   - Orders reviewed. Labs for AM ordered, which was adjusted as needed.    Alcus Dad, MD 05/11/2021, 11:29 PM PGY-2, [facility] Night Resident  Please page [phone] with questions.

## 2021-05-11 NOTE — ED Notes (Signed)
Patient transported to CT 

## 2021-05-11 NOTE — ED Notes (Signed)
I cut up and opened everything up on pts dinner tray for him.

## 2021-05-11 NOTE — ED Provider Notes (Signed)
[facility] EMERGENCY DEPARTMENT Provider Note   CSN: [id number] Arrival date & time: 05/11/21  1101     History Chief Complaint  Patient presents with   Weakness   Hypotension    Todd Dunn is a 80 y.o. male presenting for evaluation of generalized weakness and hypotension.  Level 5 caveat due to altered mental status/difficulty communicating.  Patient was at an orthopedic follow-up appointment today when he was found to be weaker than normal.  They checked his blood pressure and his blood pressure was 80 systolic, he was sent to the ER.  On arrival to the ER, per triage note, patient was lethargic and weak, nonverbal.  Blood pressure 79 systolic.  Additional history obtained from patient's granddaughter, [name], who is his primary caregiver.  She states patient's brother was taking care of him today, and states patient was "weak in the face."  Additionally his urine has smelled stronger than normal.  She does report baseline speech issues, states his speech is often garbled.  She does not know if his speech is different today.  She last saw him yesterday, and he was at his baseline.  Additional history obtained from chart review.  History of CAD, sick sinus syndrome status post pacemaker, CVA, neurogenic bladder with chronic foley, hypertension, BPH, recent osteomyelitis of the right great toe status post amputation   HPI     Past Medical History:  Diagnosis Date   BPH (benign prostatic hyperplasia)    CAD (coronary artery disease)    Chronic indwelling Foley catheter    CVA (cerebral vascular accident) ([place])    HTN (hypertension)    Neurogenic bladder    SSS (sick sinus syndrome) ([place])    s/p MDT ICD   Vitamin D deficiency     Patient Active Problem List   Diagnosis Date Noted   Iron deficiency anemia 05/07/2021   Oral phase dysphagia 04/28/2021   Protein-calorie malnutrition, severe 03/27/2021   Toe infection    PVD (peripheral vascular  disease) ([place])    Osteomyelitis of great toe of right foot ([place]) 03/20/2021   Wound infection 03/20/2021   Pacemaker 03/20/2021   Elevated alkaline phosphatase level 03/20/2021   Hyperlipidemia 03/20/2021   Hematuria 02/14/2021   Acute cystitis with hematuria    Normocytic anemia 01/06/2021   Catheter-associated urinary tract infection ([place]) 60/63/0160   Acute metabolic encephalopathy 10/93/2355   Chronic indwelling Foley catheter 12/15/2020   HTN (hypertension) 12/15/2020   History of ESBL E. coli infection 12/15/2020   BPH (benign prostatic hyperplasia) 73/22/0254   Chronic systolic heart failure ([place]) 12/12/2019    Past Surgical History:  Procedure Laterality Date   ABDOMINAL AORTOGRAM W/LOWER EXTREMITY N/A 03/23/2021   Procedure: ABDOMINAL AORTOGRAM W/LOWER EXTREMITY;  Surgeon: Waynetta Sandy, MD;  Location: [facility];  Service: Cardiovascular;  Laterality: N/A;   AMPUTATION Right 04/01/2021   Procedure: RIGHT GREAT TOE AMPUTATION;  Surgeon: Newt Minion, MD;  Location: [facility];  Service: Orthopedics;  Laterality: Right;   APPENDECTOMY     APPLICATION OF WOUND VAC Right 04/01/2021   Procedure: APPLICATION OF WOUND VAC;  Surgeon: Newt Minion, MD;  Location: [facility];  Service: Orthopedics;  Laterality: Right;   CARDIAC SURGERY     FEMORAL-TIBIAL BYPASS GRAFT Right 03/26/2021   Procedure: BYPASS GRAFT FEMORAL-TIBIAL ARTERY.  Harvest right leg saphenous vein, right vein patch angioplasty posterior tibial.;  Surgeon: Marty Heck, MD;  Location: [facility];  Service: Vascular;  Laterality:  Right;   PACEMAKER PLACEMENT         Family History  Family history unknown: Yes    Social History   Tobacco Use   Smokeless tobacco: Never  Substance Use Topics   Alcohol use: Not Currently   Drug use: Never    Home Medications Prior to Admission medications   Medication Sig Start Date End Date Taking? Authorizing Provider  acetaminophen (TYLENOL) 325 MG  tablet Take 650 mg by mouth every 4 (four) hours as needed for mild pain.    [provider]  amLODipine (NORVASC) 10 MG tablet Take 10 mg by mouth daily.    [provider]  bisacodyl (DULCOLAX) 5 MG EC tablet Take 1 tablet (5 mg total) by mouth daily as needed for moderate constipation. 04/05/21   Shawna Clamp, MD  citalopram (CELEXA) 20 MG tablet Take 20 mg by mouth at bedtime.    [provider]  clopidogrel (PLAVIX) 75 MG tablet Take 75 mg by mouth at bedtime.    [provider]  docusate sodium (COLACE) 100 MG capsule Take 1 capsule (100 mg total) by mouth every 12 (twelve) hours. 12/25/20   [name], MD  doxycycline (VIBRA-TABS) 100 MG tablet Take 1 tablet (100 mg total) by mouth 2 (two) times daily. 04/22/21   Suzan Slick, NP  finasteride (PROSCAR) 5 MG tablet Take 5 mg by mouth daily.    [provider]  furosemide (LASIX) 20 MG tablet Take 20 mg by mouth daily.    [provider]  hydrALAZINE (APRESOLINE) 50 MG tablet Take 50 mg by mouth 2 (two) times daily. Patient not taking: Reported on 05/05/2021    [provider]  HYDROcodone-acetaminophen (NORCO/VICODIN) 5-325 MG tablet Take 1-2 tablets by mouth every 4 (four) hours as needed for moderate pain (pain score 4-6). Patient not taking: Reported on 05/05/2021 04/05/21   Shawna Clamp, MD  isosorbide mononitrate (IMDUR) 60 MG 24 hr tablet Take 60 mg by mouth daily.    [provider]  nitroGLYCERIN (NITRODUR - DOSED IN MG/24 HR) 0.2 mg/hr patch Place 1 patch (0.2 mg total) onto the skin daily. Patient not taking: Reported on 05/05/2021 04/22/21   Suzan Slick, NP  nitroGLYCERIN (NITROSTAT) 0.4 MG SL tablet Place 0.4 mg under the tongue every 5 (five) minutes as needed for chest pain.    [provider]  pantoprazole (PROTONIX) 40 MG tablet Take 1 tablet (40 mg total) by mouth daily. 04/06/21   Shawna Clamp, MD  rosuvastatin (CRESTOR) 10 MG  tablet Take 10 mg by mouth daily.    [provider]  tamsulosin (FLOMAX) 0.4 MG CAPS capsule Take 0.4 mg by mouth daily.    [provider]  vitamin B-12 (CYANOCOBALAMIN) 1000 MCG tablet Take 1 tablet (1,000 mcg total) by mouth daily. 01/08/21   Lincoln Brigham, PA-C  Vitamin D, Ergocalciferol, (DRISDOL) 1.25 MG (50000 UNIT) CAPS capsule Take 50,000 Units by mouth every Monday.    [provider]    Allergies    Aspirin, Northern quahog clam (m. mercenaria) skin test, and Penicillins  Review of Systems   Review of Systems  Unable to perform ROS: Patient nonverbal  Neurological:  Positive for weakness.   Physical Exam Updated Vital Signs BP (!) 112/57   Pulse (!) 56   Temp (!) 95.5 F (35.3 C) (Rectal)   Resp 13   SpO2 100%   Physical Exam Vitals and nursing note reviewed.  Constitutional:  General: He is not in acute distress.    Appearance: He is cachectic.     Comments: Appears chronically ill  HENT:     Head: Normocephalic and atraumatic.  Eyes:     Extraocular Movements: Extraocular movements intact.     Conjunctiva/sclera: Conjunctivae normal.     Pupils: Pupils are equal, round, and reactive to light.  Cardiovascular:     Rate and Rhythm: Normal rate and regular rhythm.     Pulses: Normal pulses.  Pulmonary:     Effort: Pulmonary effort is normal. No respiratory distress.     Breath sounds: Normal breath sounds. No wheezing.     Comments: Speaking in full sentences.  Clear lung sounds in all fields. Abdominal:     General: There is no distension.     Palpations: Abdomen is soft. There is no mass.     Tenderness: There is no abdominal tenderness. There is no guarding or rebound.  Musculoskeletal:        General: Normal range of motion.     Cervical back: Normal range of motion and neck supple.     Comments: Grip strength equal bilaterally. Moving lower ext equally   Skin:    General: Skin is warm and dry.     Capillary Refill:  Capillary refill takes less than 2 seconds.  Neurological:     GCS: GCS eye subscore is 3. GCS verbal subscore is 4. GCS motor subscore is 6.     Cranial Nerves: Cranial nerves 2-12 are intact.     Sensory: Sensation is intact.     Comments: Abnormal speech, very slow to respond.  Speech occasionally garbled. Follows basic commands, limiting neuro exam.  CN appears to be intact.  Sensation appears to be intact x4.    ED Results / Procedures / Treatments   Labs (all labs ordered are listed, but only abnormal results are displayed) Labs Reviewed  COMPREHENSIVE METABOLIC PANEL - Abnormal; Notable for the following components:      Result Value   Potassium 3.4 (*)    Albumin 3.1 (*)    AST 14 (*)    All other components within normal limits  CBC WITH DIFFERENTIAL/PLATELET - Abnormal; Notable for the following components:   RBC 3.64 (*)    Hemoglobin 8.6 (*)    HCT 29.6 (*)    MCH 23.6 (*)    MCHC 29.1 (*)    RDW 17.2 (*)    All other components within normal limits  URINALYSIS, ROUTINE W REFLEX MICROSCOPIC - Abnormal; Notable for the following components:   Hgb urine dipstick MODERATE (*)    Protein, ur 30 (*)    Nitrite POSITIVE (*)    Leukocytes,Ua SMALL (*)    All other components within normal limits  RAPID URINE DRUG SCREEN, HOSP PERFORMED - Abnormal; Notable for the following components:   Opiates POSITIVE (*)    All other components within normal limits  URINALYSIS, MICROSCOPIC (REFLEX) - Abnormal; Notable for the following components:   Bacteria, UA RARE (*)    All other components within normal limits  I-STAT CHEM 8, ED - Abnormal; Notable for the following components:   Glucose, Bld 124 (*)    Hemoglobin 8.8 (*)    HCT 26.0 (*)    All other components within normal limits  I-STAT VENOUS BLOOD GAS, ED - Abnormal; Notable for the following components:   pO2, Ven 140.0 (*)    Bicarbonate 31.4 (*)    TCO2 33 (*)  Acid-Base Excess 5.0 (*)    HCT 27.0 (*)     Hemoglobin 9.2 (*)    All other components within normal limits  RESP PANEL BY RT-PCR (FLU A&B, COVID) ARPGX2  CULTURE, BLOOD (ROUTINE X 2)  CULTURE, BLOOD (ROUTINE X 2)  URINE CULTURE  AMMONIA  LACTIC ACID, PLASMA  ETHANOL  PROTIME-INR  APTT  LIPASE, BLOOD  CBG MONITORING, ED  TYPE AND SCREEN  TROPONIN I (HIGH SENSITIVITY)  TROPONIN I (HIGH SENSITIVITY)    EKG EKG Interpretation  Date/Time:  Monday May 11 2021 11:17:37 EST Ventricular Rate:  62 PR Interval:    QRS Duration: 106 QT Interval:  426 QTC Calculation: 432 R Axis:   13 Text Interpretation: Junctional rhythm Left ventricular hypertrophy with repolarization abnormality ( Cornell product ) Abnormal ECG paced, similar to previous Confirmed by Lavenia Atlas [phone]) on 05/11/2021 12:10:29 PM  Radiology CT Head Wo Contrast  Result Date: 05/11/2021 CLINICAL DATA:  Mental status change of unknown cause. Weakness and hypotension EXAM: CT HEAD WITHOUT CONTRAST TECHNIQUE: Contiguous axial images were obtained from the base of the skull through the vertex without intravenous contrast. COMPARISON:  04/06/2021 FINDINGS: Brain: Age related volume loss. No focal abnormality seen affecting the brainstem or cerebellum. Chronic small-vessel ischemic changes affect the cerebral hemispheric white matter. Old small vessel infarction of the right thalamus. No sign of acute infarction, mass lesion, hemorrhage, hydrocephalus or extra-axial collection. Vascular: There is atherosclerotic calcification of the major vessels at the base of the brain. Skull: Negative Sinuses/Orbits: Clear/normal Other: None IMPRESSION: No acute CT finding. Atrophy and chronic small-vessel ischemic change of the cerebral hemispheric white matter. Cerebral Atrophy (ICD10-G31.9). Electronically Signed   By: Nelson Chimes M.D.   On: 05/11/2021 12:57   DG Chest Portable 1 View  Result Date: 05/11/2021 CLINICAL DATA:  Hypotension.  Shortness of breath. EXAM: PORTABLE  CHEST 1 VIEW COMPARISON:  03/22/2021 FINDINGS: The heart size and mediastinal contours are within normal limits. Aortic atherosclerotic calcification noted. Prior CABG again noted. Dual lead transvenous pacemaker remains in appropriate position. Both lungs are clear. Skin fold again seen projecting over the right hemithorax. The visualized skeletal structures are unremarkable. IMPRESSION: No active disease. Electronically Signed   By: Marlaine Hind M.D.   On: 05/11/2021 12:40   DG Foot Complete Right  Result Date: 05/11/2021 CLINICAL DATA:  Possible sepsis.  Evaluate for osteomyelitis. 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 Calcaneal and Achilles spurs. No osseous destruction. No soft tissue gas. IMPRESSION: No evidence of acute osteomyelitis. Status post first phalanx and metatarsal amputation with similar hyperattenuation in the soft tissues of the surgical site. 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 This could be correlated with physical exam. Electronically Signed   By: Abigail Miyamoto M.D.   On: 05/11/2021 13:52    Procedures Procedures   Medications Ordered in ED Medications  cefTRIAXone (ROCEPHIN) 1 g in sodium chloride 0.9 % 100 mL IVPB (has no administration in time range)  sodium chloride 0.9 % bolus 1,000 mL (1,000 mLs Intravenous New Bag/Given 05/11/21 1217)    ED Course  I have reviewed the triage vital signs and the nursing notes.  Pertinent labs & imaging results that were available during my care of the patient were reviewed by me and considered in my  medical decision making (see chart for details).    MDM Rules/Calculators/A&P                           Patient presented for evaluation of altered mental status, hypotension.  On exam, patient  very slow to respond, often with garbled speech.  He is hypotensive on arrival, and rectal temperature low at 95.5.  As such, patient placed on Bair hugger.  Consider infection.  Consider pharmacologic cause.  Consider intracranial cause such as stroke or hemorrhage.  Consider seizure.  Consider arrhythmia.  Labs and imaging obtained from triage still pending.  Chest x-ray viewed and independently interpreted by me, no pneumonia, pneumothorax, effusion.  CT head negative for acute findings.  Labs overall reassuring.  No leukocytosis.  Lactic is normal.  Electrolytes are stable.  Patient with chronic anemia.  Urine is still pending.  Pacemaker was interrogated, no events reported.  Blood pressure responding well to fluids.  On reevaluation after patient has been on the bear hugger, mental status seems improved.  Continues to have delayed responses, but patient is more able to hold a conversation at this time.  Urine is positive for UTI, will send for culture.  Will treat with Rocephin.  Additionally, UDS positive for opioids.  He is prescribed Norco from his recent surgery, the patient states he has not had any today, although he is a poor historian.  Considering patient's age, medical history, and abnormal vital signs on arrival, he will need to be admitted for further evaluation.  Discussed plan with patient and patient's granddaughter over the phone, they are agreeable.  Discussed with family medicine, they will admit the patient.  Final Clinical Impression(s) / ED Diagnoses Final diagnoses:  Altered mental status, unspecified altered mental status type  Hypotension, unspecified hypotension type  Hypothermia, initial encounter  Urinary tract infection without hematuria, site unspecified    Rx / DC Orders ED Discharge Orders     None        Franchot Heidelberg, PA-C 05/11/21 1450    Horton, Alvin Critchley, DO 05/11/21 1513

## 2021-05-11 NOTE — ED Notes (Addendum)
Pts bair hugger was increased from low to medium

## 2021-05-11 NOTE — ED Notes (Signed)
[employer] paperwork scanned to the patient and the copy is labled in medical records draw

## 2021-05-11 NOTE — Progress Notes (Signed)
PHARMACY NOTE:  ANTIMICROBIAL RENAL DOSAGE ADJUSTMENT  Current antimicrobial regimen includes a mismatch between antimicrobial dosage and estimated renal function.  As per policy approved by the Pharmacy & Therapeutics and Medical Executive Committees, the antimicrobial dosage will be adjusted accordingly.  Current antimicrobial dosage:  cefepime 2g q 6hr  Indication: sepsis  Renal Function:  Estimated Creatinine Clearance: 54.6 mL/min (by C-G formula based on SCr of 0.9 mg/dL).      Antimicrobial dosage has been changed to:  2g q12 hr     Thank you for allowing pharmacy to be a part of this patient's care.  Benetta Spar, PharmD, BCPS, BCCP Clinical Pharmacist  Please check AMION for all [facility] phone numbers After 10:00 PM, call [facility] [phone]

## 2021-05-11 NOTE — ED Notes (Signed)
Patient continuous educated on the necessity of the bear hugger and that it is to keep his temperature up

## 2021-05-12 DIAGNOSIS — I110 Hypertensive heart disease with heart failure: Secondary | ICD-10-CM | POA: Diagnosis present

## 2021-05-12 DIAGNOSIS — N390 Urinary tract infection, site not specified: Secondary | ICD-10-CM | POA: Diagnosis not present

## 2021-05-12 DIAGNOSIS — E559 Vitamin D deficiency, unspecified: Secondary | ICD-10-CM | POA: Diagnosis present

## 2021-05-12 DIAGNOSIS — I69354 Hemiplegia and hemiparesis following cerebral infarction affecting left non-dominant side: Secondary | ICD-10-CM | POA: Diagnosis not present

## 2021-05-12 DIAGNOSIS — E785 Hyperlipidemia, unspecified: Secondary | ICD-10-CM | POA: Diagnosis present

## 2021-05-12 DIAGNOSIS — F940 Selective mutism: Secondary | ICD-10-CM | POA: Diagnosis present

## 2021-05-12 DIAGNOSIS — F32A Depression, unspecified: Secondary | ICD-10-CM | POA: Diagnosis present

## 2021-05-12 DIAGNOSIS — R531 Weakness: Secondary | ICD-10-CM | POA: Diagnosis present

## 2021-05-12 DIAGNOSIS — Z1623 Resistance to quinolones and fluoroquinolones: Secondary | ICD-10-CM | POA: Diagnosis present

## 2021-05-12 DIAGNOSIS — I959 Hypotension, unspecified: Secondary | ICD-10-CM | POA: Diagnosis present

## 2021-05-12 DIAGNOSIS — I10 Essential (primary) hypertension: Secondary | ICD-10-CM | POA: Diagnosis not present

## 2021-05-12 DIAGNOSIS — G9341 Metabolic encephalopathy: Secondary | ICD-10-CM | POA: Diagnosis present

## 2021-05-12 DIAGNOSIS — F0393 Unspecified dementia, unspecified severity, with mood disturbance: Secondary | ICD-10-CM | POA: Diagnosis present

## 2021-05-12 DIAGNOSIS — D509 Iron deficiency anemia, unspecified: Secondary | ICD-10-CM | POA: Diagnosis present

## 2021-05-12 DIAGNOSIS — K5900 Constipation, unspecified: Secondary | ICD-10-CM | POA: Diagnosis present

## 2021-05-12 DIAGNOSIS — R471 Dysarthria and anarthria: Secondary | ICD-10-CM | POA: Diagnosis present

## 2021-05-12 DIAGNOSIS — I5022 Chronic systolic (congestive) heart failure: Secondary | ICD-10-CM | POA: Diagnosis present

## 2021-05-12 DIAGNOSIS — F17200 Nicotine dependence, unspecified, uncomplicated: Secondary | ICD-10-CM | POA: Diagnosis present

## 2021-05-12 DIAGNOSIS — I2510 Atherosclerotic heart disease of native coronary artery without angina pectoris: Secondary | ICD-10-CM | POA: Diagnosis present

## 2021-05-12 DIAGNOSIS — Y846 Urinary catheterization as the cause of abnormal reaction of the patient, or of later complication, without mention of misadventure at the time of the procedure: Secondary | ICD-10-CM | POA: Diagnosis present

## 2021-05-12 DIAGNOSIS — T83511A Infection and inflammatory reaction due to indwelling urethral catheter, initial encounter: Secondary | ICD-10-CM | POA: Diagnosis present

## 2021-05-12 DIAGNOSIS — A4152 Sepsis due to Pseudomonas: Secondary | ICD-10-CM | POA: Diagnosis present

## 2021-05-12 DIAGNOSIS — R64 Cachexia: Secondary | ICD-10-CM | POA: Diagnosis present

## 2021-05-12 DIAGNOSIS — E876 Hypokalemia: Secondary | ICD-10-CM | POA: Diagnosis present

## 2021-05-12 DIAGNOSIS — N3001 Acute cystitis with hematuria: Secondary | ICD-10-CM | POA: Diagnosis not present

## 2021-05-12 DIAGNOSIS — G928 Other toxic encephalopathy: Secondary | ICD-10-CM | POA: Diagnosis not present

## 2021-05-12 DIAGNOSIS — R4182 Altered mental status, unspecified: Secondary | ICD-10-CM | POA: Diagnosis not present

## 2021-05-12 DIAGNOSIS — Z20822 Contact with and (suspected) exposure to covid-19: Secondary | ICD-10-CM | POA: Diagnosis present

## 2021-05-12 DIAGNOSIS — I495 Sick sinus syndrome: Secondary | ICD-10-CM | POA: Diagnosis present

## 2021-05-12 DIAGNOSIS — I70221 Atherosclerosis of native arteries of extremities with rest pain, right leg: Secondary | ICD-10-CM | POA: Diagnosis present

## 2021-05-12 LAB — CBC
HCT: 25.6 % — ABNORMAL LOW (ref 39.0–52.0)
Hemoglobin: 7.5 g/dL — ABNORMAL LOW (ref 13.0–17.0)
MCH: 23.6 pg — ABNORMAL LOW (ref 26.0–34.0)
MCHC: 29.3 g/dL — ABNORMAL LOW (ref 30.0–36.0)
MCV: 80.5 fL (ref 80.0–100.0)
Platelets: 133 10*3/uL — ABNORMAL LOW (ref 150–400)
RBC: 3.18 MIL/uL — ABNORMAL LOW (ref 4.22–5.81)
RDW: 17.2 % — ABNORMAL HIGH (ref 11.5–15.5)
WBC: 5.1 10*3/uL (ref 4.0–10.5)
nRBC: 0 % (ref 0.0–0.2)

## 2021-05-12 LAB — BLOOD CULTURE ID PANEL (REFLEXED) - BCID2

## 2021-05-12 LAB — BASIC METABOLIC PANEL
Anion gap: 6 (ref 5–15)
BUN: 14 mg/dL (ref 8–23)
CO2: 24 mmol/L (ref 22–32)
Calcium: 9 mg/dL (ref 8.9–10.3)
Chloride: 107 mmol/L (ref 98–111)
Creatinine, Ser: 0.62 mg/dL (ref 0.61–1.24)
GFR, Estimated: >60 mL/min (ref >60)
Glucose, Bld: 92 mg/dL (ref 70–99)
Potassium: 3.8 mmol/L (ref 3.5–5.1)
Sodium: 137 mmol/L (ref 135–145)

## 2021-05-12 LAB — HEMOGLOBIN AND HEMATOCRIT, BLOOD
HCT: 27.9 % — ABNORMAL LOW (ref 39.0–52.0)
Hemoglobin: 8.3 g/dL — ABNORMAL LOW (ref 13.0–17.0)

## 2021-05-12 LAB — CBG MONITORING, ED: Glucose-Capillary: 106 mg/dL — ABNORMAL HIGH (ref 70–99)

## 2021-05-12 MED ORDER — PIPERACILLIN-TAZOBACTAM 3.375 G IVPB
3.3750 g | Freq: Three times a day (TID) | INTRAVENOUS | Status: DC
Start: 2021-05-12 — End: 2021-05-14
  Administered 2021-05-12 – 2021-05-14 (×6): 3.375 g via INTRAVENOUS
  Filled 2021-05-12 (×7): qty 50

## 2021-05-12 MED ORDER — CLOPIDOGREL BISULFATE 75 MG PO TABS
75.0000 mg | ORAL_TABLET | Freq: Every day | ORAL | Status: DC
  Administered 2021-05-12 – 2021-05-13 (×2): 75 mg via ORAL
  Filled 2021-05-12 (×3): qty 1

## 2021-05-12 MED ORDER — SENNA 8.6 MG PO TABS: 1.0000 | ORAL_TABLET | Freq: Every day | ORAL | Status: DC | PRN

## 2021-05-12 MED ORDER — DOCUSATE SODIUM 100 MG PO CAPS
100.0000 mg | ORAL_CAPSULE | Freq: Two times a day (BID) | ORAL | Status: DC
  Administered 2021-05-12 – 2021-05-14 (×5): 100 mg via ORAL
  Filled 2021-05-12 (×5): qty 1

## 2021-05-12 MED ORDER — DIVALPROEX SODIUM 125 MG PO DR TAB
125.0000 mg | DELAYED_RELEASE_TABLET | Freq: Every day | ORAL | Status: DC
  Administered 2021-05-12 – 2021-05-13 (×2): 125 mg via ORAL
  Filled 2021-05-12 (×5): qty 1

## 2021-05-12 MED ORDER — FINASTERIDE 5 MG PO TABS
5.0000 mg | ORAL_TABLET | Freq: Every morning | ORAL | Status: DC
  Administered 2021-05-12 – 2021-05-14 (×3): 5 mg via ORAL
  Filled 2021-05-12 (×3): qty 1

## 2021-05-12 MED ORDER — ROSUVASTATIN CALCIUM 5 MG PO TABS
10.0000 mg | ORAL_TABLET | Freq: Every morning | ORAL | Status: DC
  Administered 2021-05-12 – 2021-05-14 (×3): 10 mg via ORAL
  Filled 2021-05-12 (×3): qty 2

## 2021-05-12 MED ORDER — CHLORHEXIDINE GLUCONATE CLOTH 2 % EX PADS
6.0000 | MEDICATED_PAD | Freq: Every day | CUTANEOUS | Status: DC
  Administered 2021-05-12 – 2021-05-14 (×3): 6 via TOPICAL

## 2021-05-12 MED ORDER — PANTOPRAZOLE SODIUM 40 MG PO TBEC
40.0000 mg | DELAYED_RELEASE_TABLET | Freq: Every day | ORAL | Status: DC
  Administered 2021-05-12 – 2021-05-14 (×3): 40 mg via ORAL
  Filled 2021-05-12 (×3): qty 1

## 2021-05-12 MED ORDER — VITAMIN B-12 1000 MCG PO TABS
1000.0000 ug | ORAL_TABLET | Freq: Every day | ORAL | Status: DC
  Administered 2021-05-12 – 2021-05-14 (×3): 1000 ug via ORAL
  Filled 2021-05-12 (×3): qty 1

## 2021-05-12 MED ORDER — POLYETHYLENE GLYCOL 3350 17 G PO PACK: 17.0000 g | PACK | Freq: Every day | ORAL | Status: DC | PRN

## 2021-05-12 MED ORDER — VITAMIN D (ERGOCALCIFEROL) 1.25 MG (50000 UNIT) PO CAPS: 50000.0000 [IU] | ORAL_CAPSULE | ORAL | Status: DC

## 2021-05-12 NOTE — ED Notes (Signed)
Pt continues to remove lines and leads

## 2021-05-12 NOTE — Progress Notes (Addendum)
Family Medicine Teaching Service Daily Progress Note Intern Pager: [phone]  Patient name: Todd Dunn Medical record number: [id number] Date of birth: 1940/08/09 Age: 80 y.o. Gender: male  Primary Care Provider: Fredrich Romans, [place] Consultants: None Code Status: Full Code   Pt Overview and Major Events to Date:  12/5: Admission  Assessment and Plan: WYLDER MACOMBER is a 80 y.o. male presenting with Weakness and Hypotension who was admitted for management of possible sepsis. 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  Acute metabolic encephalopathy  hypotension  hypothermia, RESOLVED  Suspect dementia Suspect sepsis secondary to UTI versus osteomyelitis. Patient was A&Ox3, to self, location, situation, not year, stated 2004.  Speech was dysarthric, intermittently incoherent, with selective mutism. His eyes would dart around the room, inappropriate smiling, thought blocking, scanning speech. Possibly internally preoccupied. Neuro exam showed decreased sensation on left upper and lower extremity, right side of entire face. Grade 5/5 strength upper and lower extremities. When following commands, there is latency in motion versus def[place]. His temperature >95 F and BP has improved with the bair hugger, IV antimicrobial and IVF.  Stopped IVF because of his HFrEF. TSH WNL.  Blood culture x2 showed no growth thus far.  CT showed no acute intracranial processes, bleeding, masses, just chronic changes and cerebral atrophy. Other concerns are cerebral hypoperfusion event vs acute/subacute CVA. 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 Sharol Given, has a follow-up appointment on 12/12.  On physical exam, purulent drainage noted with mild tenderness. Afebrile and WBC 5.1. Foot x-ray was negative for osteomyelitis, CRP WNL, but had elevated sed rate 18.  We will consider MRI and contacting his orthopedist for concerns of OM.  IV antimicrobials per above Follow-up blood culture Daily CBC  Wound care consulted, appreciate assistance recommendations  Complicated UTI  neurogenic bladder with chronic indwelling Foley catheter Patient endorses dysuria, suspect irritation from the Foley.  Urine culture grew Pseudomonas and E. faecalis, pending susceptibility. IV antimicrobials per above Follow-up urine culture sensitivity  IDA Patient follows Dr. Lorenso Courier (heme-onc), who suspects its multifactorial from vitamin B12 deficiency and chronic disease leading iron-deficiency.  At the time suspected iron deficiency 2/2 toe amputation. Hb 8.3 on BMP, 7.5 on H/H.  At baseline Hb 8-9. Low reticulocyte 21.1, low iron 20, low saturation ratio 6%, low ferritin 15, (11/29). Type and screening done, transfusion threshold Hb <8 because of cardiac history.  Per chart, patient is hesitant to start PO iron supplement due to constipation.  Trend hemoglobin We will consider PO iron supplement Vitamin B12 1000 mcg daily Transfuse for Hb <8  CAD/CABG 2014  hx of CVA with residual left hemiplegia  PAD s/p right great toe amputation Plavix 75 mg Rosuvastatin 10 mg  HFrEF (EF 25-35%, Oct 2022) SSS s/p MDT ICD Wt is 61 kg. K+ 3.8, Cr 0.62. Junctional rhythm on admission EKG. Holding home Lasix 20 mg in the setting of hypotension and hypothermia.  Currently patient is not hypervolemic.  We will continue to monitor and consider restarting it 12/7. Strict I/Os and daily weights Will consider BMP after starting home lasix 12/7 K+ >4, Mg2+ >2,  replete as needed  F/u Mag  BPH Holding home tamsulosin in the setting of hypotension on  admission. Finasteride 5 mg daily  HTN Amlodipine 10 mg daily at home, we will continue to hold in the setting of hypotension and hypothermia per above.  FEN/GI: Regular PPx: enoxaparin (LOVENOX) injection 40 mg Start: 05/11/21 1600 Dispo:Pending PT recommendations  pending clinical improvement . Barriers include medical improvement.   Subjective:  Todd Dunn was seen this AM. Stated that he feels much better today. A&O to self, situation, location, but not time. Stated that it was 2004.  He endorsed dysuria and history of UTI.   Objective: Temp:  [95.5 F (35.3 C)-97.6 F (36.4 C)] 97.5 F (36.4 C) (12/06 1100) Pulse Rate:  [35-110] 67 (12/06 1100) Resp:  [12-28] 19 (12/06 1100) BP: (94-150)/(40-83) 138/83 (12/06 1100) SpO2:  [10 %-100 %] 97 % (12/06 1100) Physical Exam Vitals and nursing note reviewed.  Constitutional:      General: He is not in acute distress.    Appearance: He is cachectic. He is ill-appearing. He is not toxic-appearing or diaphoretic.  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  Eyes:     General: Scleral icterus present.  Cardiovascular:     Rate and Rhythm: Regular rhythm. Tachycardia present.     Pulses:          Dorsalis pedis pulses are 1+ on the right side.     Heart sounds: Murmur heard.  Systolic murmur is present with a grade of 2/6.    No friction rub. No gallop.     Comments: Left foot wrapped in gauze, and patient uncooperative, not able to palpate DP MDT ICD visible under the skin Pulmonary:     Effort: Pulmonary effort is normal. No tachypnea or respiratory distress.     Breath sounds: Decreased air movement and transmitted upper airway sounds present. Decreased breath sounds present. No wheezing, rhonchi or rales.  Abdominal:     General: Abdomen is flat.     Palpations: Abdomen is soft.      Tenderness: There is no abdominal tenderness.  Musculoskeletal:     Cervical back: Normal range of motion.     Right lower leg: No edema.     Left lower leg: No edema.  Feet:     Comments: Exam was limited by patient uncooperation.  Amputated right great toe, purulent discharge, tender on palpation, no surrounding erythema Neurological:     Mental Status: He is alert.     Cranial Nerves: Dysarthria present. No facial asymmetry.     Sensory: Sensory deficit present.     Motor: Abnormal muscle tone present. No weakness, tremor, seizure activity or pronator drift.     Comments: Does not ambulate, uses a wheelchair.  Decreased sensation on right side of face, left upper and lower extremities.  Psychiatric:        Attention and Perception: He is inattentive.        Speech: Speech is slurred and tangential.        Behavior: Behavior is uncooperative.        Cognition and Memory: Memory is impaired.     Comments: Dysarthric speech, selective mutism, speech.  Eyes darting around the room, then staring, then smiling, possibly internally preoccupied with AVH.     Laboratory: Recent  Labs  Lab 05/05/21 1512 05/11/21 1145 05/11/21 1308 05/11/21 1309 05/12/21 0320 05/12/21 0453  WBC 3.9* 4.8  --   --  5.1  --   HGB 8.4* 8.6*   < > 8.8* 7.5* 8.3*  HCT 27.8* 29.6*   < > 26.0* 25.6* 27.9*  PLT 181 185  --   --  133*  --    < > = values in this interval not displayed.   Recent Labs  Lab 05/05/21 1512 05/11/21 1145 05/11/21 1308 05/11/21 1309 05/12/21 0320  NA 143 140 142 142 137  K 3.5 3.4* 3.6 3.6 3.8  CL 107 106  --  105 107  CO2 31 28  --   --  24  BUN 19 15  --  22 14  CREATININE 0.87 0.87  --  0.90 0.62  CALCIUM 9.6 10.1  --   --  9.0  PROT 6.8 6.8  --   --   --   BILITOT 0.3 0.3  --   --   --   ALKPHOS 126 126  --   --   --   ALT 20 14  --   --   --   AST 18 14*  --   --   --   GLUCOSE 132* 88  --  124* 92   Results for orders placed or performed during the hospital  encounter of 05/11/21 (from the past 24 hour(s))  Blood Cultures (routine x 2)     Status: None (Preliminary result)   Collection Time: 05/11/21 12:25 PM   Specimen: BLOOD  Result Value Ref Range   Specimen Description BLOOD LEFT ANTECUBITAL    Special Requests      BOTTLES DRAWN AEROBIC AND ANAEROBIC Blood Culture adequate volume   Culture      NO GROWTH < 24 HOURS Performed at [facility], [address], [place], [place] [address]    Report Status PENDING   Troponin I (High Sensitivity)     Status: None   Collection Time: 05/11/21 12:25 PM  Result Value Ref Range   Troponin I (High Sensitivity) 9 <18 ng/L  Resp Panel by RT-PCR (Flu A&B, Covid) Nasopharyngeal Swab     Status: None   Collection Time: 05/11/21  1:00 PM   Specimen: Nasopharyngeal Swab; Nasopharyngeal(NP) swabs in vial transport medium  Result Value Ref Range   SARS Coronavirus 2 by RT PCR NEGATIVE NEGATIVE   Influenza A by PCR NEGATIVE NEGATIVE   Influenza B by PCR NEGATIVE NEGATIVE  I-Stat venous blood gas, ED     Status: Abnormal   Collection Time: 05/11/21  1:08 PM  Result Value Ref Range   pH, Ven 7.350 7.250 - 7.430   pCO2, Ven 56.9 44.0 - 60.0 mmHg   pO2, Ven 140.0 (H) 32.0 - 45.0 mmHg   Bicarbonate 31.4 (H) 20.0 - 28.0 mmol/L   TCO2 33 (H) 22 - 32 mmol/L   O2 Saturation 99.0 %   Acid-Base Excess 5.0 (H) 0.0 - 2.0 mmol/L   Sodium 142 135 - 145 mmol/L   Potassium 3.6 3.5 - 5.1 mmol/L   Calcium, Ion 1.35 1.15 - 1.40 mmol/L   HCT 27.0 (L) 39.0 - 52.0 %   Hemoglobin 9.2 (L) 13.0 - 17.0 g/dL   Sample type VENOUS   I-Stat Chem 8, ED     Status: Abnormal   Collection Time: 05/11/21  1:09 PM  Result Value Ref Range   Sodium 142 135 -  145 mmol/L   Potassium 3.6 3.5 - 5.1 mmol/L   Chloride 105 98 - 111 mmol/L   BUN 22 8 - 23 mg/dL   Creatinine, Ser 0.90 0.61 - 1.24 mg/dL   Glucose, Bld 124 (H) 70 - 99 mg/dL   Calcium, Ion 1.32 1.15 - 1.40 mmol/L   TCO2 31 22 - 32 mmol/L   Hemoglobin 8.8 (L) 13.0 -  17.0 g/dL   HCT 26.0 (L) 39.0 - 52.0 %  Urinalysis, Routine w reflex microscopic Urine, Catheterized     Status: Abnormal   Collection Time: 05/11/21  1:45 PM  Result Value Ref Range   Color, Urine YELLOW YELLOW   APPearance CLEAR CLEAR   Specific Gravity, Urine 1.025 1.005 - 1.030   pH 6.0 5.0 - 8.0   Glucose, UA NEGATIVE NEGATIVE mg/dL   Hgb urine dipstick MODERATE (A) NEGATIVE   Bilirubin Urine NEGATIVE NEGATIVE   Ketones, ur NEGATIVE NEGATIVE mg/dL   Protein, ur 30 (A) NEGATIVE mg/dL   Nitrite POSITIVE (A) NEGATIVE   Leukocytes,Ua SMALL (A) NEGATIVE  Urine rapid drug screen (hosp performed)     Status: Abnormal   Collection Time: 05/11/21  1:45 PM  Result Value Ref Range   Opiates POSITIVE (A) NONE DETECTED   Cocaine NONE DETECTED NONE DETECTED   Benzodiazepines NONE DETECTED NONE DETECTED   Amphetamines NONE DETECTED NONE DETECTED   Tetrahydrocannabinol NONE DETECTED NONE DETECTED   Barbiturates NONE DETECTED NONE DETECTED  Urinalysis, Microscopic (reflex)     Status: Abnormal   Collection Time: 05/11/21  1:45 PM  Result Value Ref Range   RBC / HPF 21-50 0 - 5 RBC/hpf   WBC, UA >50 0 - 5 WBC/hpf   Bacteria, UA RARE (A) NONE SEEN   Squamous Epithelial / LPF NONE SEEN 0 - 5   WBC Clumps PRESENT    Mucus PRESENT    Hyaline Casts, UA PRESENT   Urine Culture     Status: Abnormal (Preliminary result)   Collection Time: 05/11/21  2:28 PM   Specimen: Urine, Clean Catch  Result Value Ref Range   Specimen Description URINE, CLEAN CATCH    Special Requests NONE    Culture (A)     >=100,000 COLONIES/mL PSEUDOMONAS AERUGINOSA >=100,000 COLONIES/mL ENTEROCOCCUS FAECALIS SUSCEPTIBILITIES TO FOLLOW Performed at [facility], [address], [place], [place] [address]    Report Status PENDING   Sedimentation rate     Status: Abnormal   Collection Time: 05/11/21  3:42 PM  Result Value Ref Range   Sed Rate 18 (H) 0 - 16 mm/hr  TSH     Status: None   Collection Time:  05/11/21  5:20 PM  Result Value Ref Range   TSH 0.740 0.350 - 4.500 uIU/mL  C-reactive protein     Status: None   Collection Time: 05/11/21  5:20 PM  Result Value Ref Range   CRP 0.6 <1.0 mg/dL  Basic metabolic panel     Status: None   Collection Time: 05/12/21  3:20 AM  Result Value Ref Range   Sodium 137 135 - 145 mmol/L   Potassium 3.8 3.5 - 5.1 mmol/L   Chloride 107 98 - 111 mmol/L   CO2 24 22 - 32 mmol/L   Glucose, Bld 92 70 - 99 mg/dL   BUN 14 8 - 23 mg/dL   Creatinine, Ser 0.62 0.61 - 1.24 mg/dL   Calcium 9.0 8.9 - 10.3 mg/dL   GFR, Estimated >60 >60 mL/min  Anion gap 6 5 - 15  CBC     Status: Abnormal   Collection Time: 05/12/21  3:20 AM  Result Value Ref Range   WBC 5.1 4.0 - 10.5 K/uL   RBC 3.18 (L) 4.22 - 5.81 MIL/uL   Hemoglobin 7.5 (L) 13.0 - 17.0 g/dL   HCT 25.6 (L) 39.0 - 52.0 %   MCV 80.5 80.0 - 100.0 fL   MCH 23.6 (L) 26.0 - 34.0 pg   MCHC 29.3 (L) 30.0 - 36.0 g/dL   RDW 17.2 (H) 11.5 - 15.5 %   Platelets 133 (L) 150 - 400 K/uL   nRBC 0.0 0.0 - 0.2 %  Hemoglobin and hematocrit, blood     Status: Abnormal   Collection Time: 05/12/21  4:53 AM  Result Value Ref Range   Hemoglobin 8.3 (L) 13.0 - 17.0 g/dL   HCT 27.9 (L) 39.0 - 52.0 %  CBG monitoring, ED     Status: Abnormal   Collection Time: 05/12/21  8:36 AM  Result Value Ref Range   Glucose-Capillary 106 (H) 70 - 99 mg/dL    Imaging/Diagnostic Tests: CT Head Wo Contrast  Result Date: 05/11/2021 CLINICAL DATA:  Mental status change of unknown cause. Weakness and hypotension EXAM: CT HEAD WITHOUT CONTRAST TECHNIQUE: Contiguous axial images were obtained from the base of the skull through the vertex without intravenous contrast. COMPARISON:  04/06/2021 FINDINGS: Brain: Age related volume loss. No focal abnormality seen affecting the brainstem or cerebellum. Chronic small-vessel ischemic changes affect the cerebral hemispheric white matter. Old small vessel infarction of the right thalamus. No sign of acute  infarction, mass lesion, hemorrhage, hydrocephalus or extra-axial collection. Vascular: There is atherosclerotic calcification of the major vessels at the base of the brain. Skull: Negative Sinuses/Orbits: Clear/normal Other: None IMPRESSION: No acute CT finding. Atrophy and chronic small-vessel ischemic change of the cerebral hemispheric white matter. Cerebral Atrophy (ICD10-G31.9). Electronically Signed   By: Nelson Chimes M.D.   On: 05/11/2021 12:57   DG Chest Portable 1 View  Result Date: 05/11/2021 CLINICAL DATA:  Hypotension.  Shortness of breath. EXAM: PORTABLE CHEST 1 VIEW COMPARISON:  03/22/2021 FINDINGS: The heart size and mediastinal contours are within normal limits. Aortic atherosclerotic calcification noted. Prior CABG again noted. Dual lead transvenous pacemaker remains in appropriate position. Both lungs are clear. Skin fold again seen projecting over the right hemithorax. The visualized skeletal structures are unremarkable. IMPRESSION: No active disease. Electronically Signed   By: Marlaine Hind M.D.   On: 05/11/2021 12:40   DG Foot Complete Right  Result Date: 05/11/2021 CLINICAL DATA:  Possible sepsis.  Evaluate for osteomyelitis. 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 Calcaneal and Achilles spurs. No osseous destruction. No soft tissue gas. IMPRESSION: No evidence of acute osteomyelitis. Status post first phalanx and metatarsal amputation with similar hyperattenuation in the soft tissues of the surgical site. 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 This could be correlated with physical exam. Electronically Signed   By: Abigail Miyamoto M.D.   On: 05/11/2021 13:52     Merrily Brittle, DO 05/12/2021, 12:05 PM PGY-1, [place] Intern  pager: [phone], text pages welcome

## 2021-05-12 NOTE — Progress Notes (Signed)
FPTS Brief Progress Note  S: Patient seen at bedside for evening rounds and was sleeping comfortably. I did not wake him.   Spoke with primary RN, Raquel Sarna, who does not have any concerns at this time. Reports patient is confused, but daughter stated to her earlier this evening that this is his baseline.  Dr. Alfonse Spruce also spoke with patient's daughter at bedside earlier this evening and confirmed patient is at his normal  baseline.  O: BP (!) 152/63 (BP Location: Left Arm)   Pulse 64   Temp 97.6 F (36.4 C) (Axillary)   Resp 18   SpO2 98%   Gen: sleeping comfortably, NAD Resp: normal work of breathing, equal chest rise noted  A/P: Clinically improved. VSS. Mental status at baseline.  UTI: urine culture growing >100,000 cfu pseudomonas and enterococcus faecalis. Sensitivities pending, but discussed with pharmacy who recommended switching to [place] per pharmacy  +blood culture 1 out of 4 blood cx positive for staph species-- most likely contaminant. However, Zosyn as above for UTI would also cover any potential staph species.  Remainder of plan per day team progress note  - Orders reviewed. Labs for AM ordered, which was adjusted as needed.    Alcus Dad, MD 05/12/2021, 9:39 PM PGY-2, [place] [facility] Night Resident  Please page [phone] with questions.

## 2021-05-12 NOTE — Evaluation (Signed)
Clinical/Bedside Swallow Evaluation Patient Details  Name: Todd Dunn MRN: [id number] Date of Birth: 16-Jan-1941  Today's Date: 05/12/2021 Time: SLP Start Time (ACUTE ONLY): 63 SLP Stop Time (ACUTE ONLY): 1308 SLP Time Calculation (min) (ACUTE ONLY): 12 min  Past Medical History:  Past Medical History:  Diagnosis Date   BPH (benign prostatic hyperplasia)    CAD (coronary artery disease)    Chronic indwelling Foley catheter    CVA (cerebral vascular accident) ([place])    HTN (hypertension)    Neurogenic bladder    SSS (sick sinus syndrome) ([place])    s/p MDT ICD   Vitamin D deficiency    Past Surgical History:  Past Surgical History:  Procedure Laterality Date   ABDOMINAL AORTOGRAM W/LOWER EXTREMITY N/A 03/23/2021   Procedure: ABDOMINAL AORTOGRAM W/LOWER EXTREMITY;  Surgeon: Waynetta Sandy, MD;  Location: [facility];  Service: Cardiovascular;  Laterality: N/A;   AMPUTATION Right 04/01/2021   Procedure: RIGHT GREAT TOE AMPUTATION;  Surgeon: Newt Minion, MD;  Location: [facility];  Service: Orthopedics;  Laterality: Right;   APPENDECTOMY     APPLICATION OF WOUND VAC Right 04/01/2021   Procedure: APPLICATION OF WOUND VAC;  Surgeon: Newt Minion, MD;  Location: [facility];  Service: Orthopedics;  Laterality: Right;   CARDIAC SURGERY     FEMORAL-TIBIAL BYPASS GRAFT Right 03/26/2021   Procedure: BYPASS GRAFT FEMORAL-TIBIAL ARTERY.  Harvest right leg saphenous vein, right vein patch angioplasty posterior tibial.;  Surgeon: Marty Heck, MD;  Location: [facility];  Service: Vascular;  Laterality: Right;   PACEMAKER PLACEMENT     HPI:  Pt is an 80 yo male admitted with possible sepsis secondary to UTI vs osteomyelitis. 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 Pt was been evaluated by SLP in the past with recommendations for thin liquids but softer solids varying from purees to mechanical soft solids based upon mentation.    Assessment / Plan / Recommendation  Clinical Impression  Pt presents with generally functional swallowing without overt s/s of aspiration, although mentation could certainly increase risk as he appears to be somewhat impulsive. He also moves around a lot in the bed while eating. Recommend regular solids and thin liquids, although he could likely benefit from assistance during meals (at least set-up for safety). SLP to sign off but please reorder with any acute concerns. SLP Visit Diagnosis: Dysphagia, unspecified (R13.10)    Aspiration Risk  Mild aspiration risk    Diet Recommendation Regular;Thin liquid   Liquid Administration via: Cup;Straw Medication Administration: Whole meds with liquid Supervision: Patient able to self feed;Intermittent supervision to cue for compensatory strategies Compensations: Minimize environmental distractions;Slow rate;Small sips/bites Postural Changes: Seated upright at 90 degrees    Other  Recommendations Oral Care Recommendations: Oral care BID    Recommendations for follow up therapy are one component of a multi-disciplinary discharge planning process, led by the attending physician.  Recommendations may be updated based on patient status, additional functional criteria and insurance authorization.  Follow up Recommendations No SLP follow up      Assistance Recommended at Discharge    Functional Status Assessment Patient has not had a recent decline in their functional status  Frequency and Duration  Prognosis Prognosis for Safe Diet Advancement: Good      Swallow Study   General HPI: Pt is an 80 yo male admitted with possible sepsis secondary to UTI vs osteomyelitis. 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 Pt was been evaluated by SLP in the past with recommendations for thin liquids but softer solids varying from purees to mechanical soft solids based upon mentation. Type of Study: Bedside Swallow Evaluation Previous Swallow Assessment: see HPI Diet Prior to this Study: Regular;Thin liquids Temperature Spikes Noted: No Respiratory Status: Room air History of Recent Intubation: No Behavior/Cognition: Alert;Cooperative;Requires cueing Oral Cavity Assessment: Within Functional Limits Oral Care Completed by SLP: No Oral Cavity - Dentition: Missing dentition Vision: Functional for self-feeding Self-Feeding Abilities: Able to feed self Patient Positioning: Upright in bed (although repositions himself frequently) Baseline Vocal Quality: Normal    Oral/Motor/Sensory Function     Ice Chips Ice chips: Not tested   Thin Liquid Thin Liquid: Within functional limits Presentation: Self Fed;Straw    Nectar Thick Nectar Thick Liquid: Not tested   Honey Thick Honey Thick Liquid: Not tested   Puree Puree: Within functional limits Presentation: Spoon   Solid     Solid: Within functional limits Presentation: Self Fed      Osie Bond., M.A. [place] [place] [facility] Pager [phone] Office [phone]  05/12/2021,2:21 PM

## 2021-05-12 NOTE — Plan of Care (Signed)

## 2021-05-12 NOTE — ED Notes (Signed)
ED TO INPATIENT HANDOFF REPORT  ED Nurse Name and Phone #: Teodoro Spray [phone]  S Name/Age/Gender Todd Dunn 80 y.o. male Room/Bed: 018C/018C  Code Status   Code Status: Full Code  Home/SNF/Other Home Patient oriented to: self and place Is this baseline? Yes   Triage Complete: Triage complete  Chief Complaint AMS (altered mental status) [R41.82]  Triage Note Pt arrived for weakness and low bp. Bp 79/47 on arrival. Pt lethargic at triage, denies any pain.    Allergies Allergies  Allergen Reactions   Food Anaphylaxis and Rash    Clams   Aspirin Itching   [place] Clam (M. Mercenaria) Skin Test Itching   Penicillins Itching    Level of Care/Admitting Diagnosis ED Disposition     ED Disposition  Admit   Condition  --   Comment  Hospital Area: [facility] [[id number]]  Level of Care: Progressive [102]  Admit to Progressive based on following criteria: MULTISYSTEM THREATS such as stable sepsis, metabolic/electrolyte imbalance with or without encephalopathy that is responding to early treatment.  May place patient in observation at [facility] or [facility] if equivalent level of care is available:: Yes  Covid Evaluation: Asymptomatic Screening Protocol (No Symptoms)  Diagnosis: AMS (altered mental status) [[id number]]  Admitting Physician: Lenoria Chime [[id number]]  Attending Physician: Lenoria Chime [[id number]]          B Medical/Surgery History Past Medical History:  Diagnosis Date   BPH (benign prostatic hyperplasia)    CAD (coronary artery disease)    Chronic indwelling Foley catheter    CVA (cerebral vascular accident) ([place])    HTN (hypertension)    Neurogenic bladder    SSS (sick sinus syndrome) ([place])    s/p MDT ICD   Vitamin D deficiency    Past Surgical History:  Procedure Laterality Date   ABDOMINAL AORTOGRAM W/LOWER EXTREMITY N/A 03/23/2021   Procedure: ABDOMINAL AORTOGRAM W/LOWER EXTREMITY;  Surgeon: Waynetta Sandy, MD;  Location: [facility];  Service: Cardiovascular;  Laterality: N/A;   AMPUTATION Right 04/01/2021   Procedure: RIGHT GREAT TOE AMPUTATION;  Surgeon: Newt Minion, MD;  Location: [facility];  Service: Orthopedics;  Laterality: Right;   APPENDECTOMY     APPLICATION OF WOUND VAC Right 04/01/2021   Procedure: APPLICATION OF WOUND VAC;  Surgeon: Newt Minion, MD;  Location: [facility];  Service: Orthopedics;  Laterality: Right;   CARDIAC SURGERY     FEMORAL-TIBIAL BYPASS GRAFT Right 03/26/2021   Procedure: BYPASS GRAFT FEMORAL-TIBIAL ARTERY.  [place] right leg saphenous vein, right vein patch angioplasty posterior tibial.;  Surgeon: Marty Heck, MD;  Location: [facility];  Service: Vascular;  Laterality: Right;   PACEMAKER PLACEMENT       A IV Location/Drains/Wounds Patient Lines/Drains/Airways Status     Active Line/Drains/Airways     Name Placement date Placement time Site Days   Peripheral IV 05/11/21 20 G 1" Left;Posterior Forearm 05/11/21  1955  Forearm  1   Negative Pressure Wound Therapy Foot Anterior;Right;Circumferential 04/01/21  1208  --  41   Urethral Catheter [place], RN Latex 16 Fr. 04/13/21  0016  Latex  29   Incision (Closed) 03/26/21 Groin Right 03/26/21  1049  -- 47   Incision (Closed) 03/26/21 Leg Right 03/26/21  1049  -- 47   Incision (Closed) 04/01/21 Foot Right 04/01/21  1131  -- 41   Wound / Incision (Open or Dehisced) 03/21/21 Other (Comment) Toe (Comment  which one)  Right;Posterior purple, intact 03/21/21  0010  Toe (Comment  which one)  52            Intake/Output Last 24 hours  Intake/Output Summary (Last 24 hours) at 05/12/2021 1105 Last data filed at 05/12/2021 0944 Gross per 24 hour  Intake 1294.04 ml  Output 800 ml  Net 494.04 ml    Labs/Imaging Results for orders placed or performed during the hospital encounter of 05/11/21 (from the past 48 hour(s))  Comprehensive metabolic panel     Status: Abnormal   Collection Time:  05/11/21 11:45 AM  Result Value Ref Range   Sodium 140 135 - 145 mmol/L   Potassium 3.4 (L) 3.5 - 5.1 mmol/L   [place] 106 98 - 111 mmol/L   CO2 28 22 - 32 mmol/L   Glucose, Bld 88 70 - 99 mg/dL    Comment: Glucose reference range applies only to samples taken after fasting for at least 8 hours.   BUN 15 8 - 23 mg/dL   Creatinine, Ser 0.87 0.61 - 1.24 mg/dL   Calcium 10.1 8.9 - 10.3 mg/dL   Total Protein 6.8 6.5 - 8.1 g/dL   Albumin 3.1 (L) 3.5 - 5.0 g/dL   AST 14 (L) 15 - 41 U/L   ALT 14 0 - 44 U/L   Alkaline Phosphatase 126 38 - 126 U/L   Total Bilirubin 0.3 0.3 - 1.2 mg/dL   GFR, Estimated >60 >60 mL/min    Comment: (NOTE) Calculated using the CKD-EPI Creatinine Equation (2021)    Anion gap 6 5 - 15    Comment: Performed at [facility] [address], [place], [place] [address]  CBC WITH DIFFERENTIAL     Status: Abnormal   Collection Time: 05/11/21 11:45 AM  Result Value Ref Range   WBC 4.8 4.0 - 10.5 K/uL   RBC 3.64 (L) 4.22 - 5.81 MIL/uL   Hemoglobin 8.6 (L) 13.0 - 17.0 g/dL   HCT 29.6 (L) 39.0 - 52.0 %   MCV 81.3 80.0 - 100.0 fL   MCH 23.6 (L) 26.0 - 34.0 pg   MCHC 29.1 (L) 30.0 - 36.0 g/dL   RDW 17.2 (H) 11.5 - 15.5 %   Platelets 185 150 - 400 K/uL   nRBC 0.0 0.0 - 0.2 %   Neutrophils Relative % 45 %   Neutro Abs 2.1 1.7 - 7.7 K/uL   Lymphocytes Relative 43 %   Lymphs Abs 2.1 0.7 - 4.0 K/uL   Monocytes Relative 9 %   Monocytes Absolute 0.4 0.1 - 1.0 K/uL   Eosinophils Relative 2 %   Eosinophils Absolute 0.1 0.0 - 0.5 K/uL   Basophils Relative 1 %   Basophils Absolute 0.0 0.0 - 0.1 K/uL   Immature Granulocytes 0 %   Abs Immature Granulocytes 0.01 0.00 - 0.07 K/uL    Comment: Performed at [facility], [address], [place], [place] [address]  Ammonia     Status: None   Collection Time: 05/11/21 11:45 AM  Result Value Ref Range   Ammonia 10 9 - 35 umol/L    Comment: Performed at [facility], [place] [address], [place], [place] [address]   Lactic acid, plasma     Status: None   Collection Time: 05/11/21 11:45 AM  Result Value Ref Range   Lactic Acid, Venous 1.9 0.5 - 1.9 mmol/L    Comment: Performed at [facility] [address], [place], [place] [address]  Ethanol     Status: None   Collection Time: 05/11/21 11:45 AM  Result Value Ref Range   Alcohol, Ethyl (B) <10 <10 mg/dL    Comment: (NOTE) Lowest detectable limit for serum alcohol is 10 mg/dL.  For medical purposes only. Performed at [facility], [place] [address], [place], [place] [address]   Blood Cultures (routine x 2)     Status: None (Preliminary result)   Collection Time: 05/11/21 11:45 AM   Specimen: BLOOD RIGHT ARM  Result Value Ref Range   Specimen Description BLOOD RIGHT ARM    Special Requests      BOTTLES DRAWN AEROBIC AND ANAEROBIC Blood Culture results may not be optimal due to an inadequate volume of blood received in culture bottles   Culture      NO GROWTH < 24 HOURS Performed at [facility], [place] [address] [place], [place] [address]    Report Status PENDING   Protime-INR     Status: None   Collection Time: 05/11/21 11:45 AM  Result Value Ref Range   Prothrombin Time 14.3 11.4 - 15.2 seconds   INR 1.1 0.8 - 1.2    Comment: (NOTE) INR goal varies based on device and disease states. Performed at [facility], [place] [address], [place], [place] [address]   APTT     Status: None   Collection Time: 05/11/21 11:45 AM  Result Value Ref Range   aPTT 30 24 - 36 seconds    Comment: Performed at [facility] [address] [place], [place] [address]  Troponin I (High Sensitivity)     Status: None   Collection Time: 05/11/21 11:45 AM  Result Value Ref Range   Troponin I (High Sensitivity) 11 <18 ng/L    Comment: (NOTE) Elevated high sensitivity troponin I (hsTnI) values and significant  changes across serial measurements may suggest ACS but many other  chronic and acute conditions are known to elevate hsTnI  results.  Refer to the "Links" section for chest pain algorithms and additional  guidance. Performed at [facility], [place] [address], [place], [place] [address]   Lipase, blood     Status: None   Collection Time: 05/11/21 11:45 AM  Result Value Ref Range   Lipase 29 11 - 51 U/L    Comment: Performed at [facility] [address] [place], [place] [address]  Type and screen [place]     Status: None   Collection Time: 05/11/21 11:45 AM  Result Value Ref Range   ABO/RH(D) A NEG    Antibody Screen NEG    Sample Expiration      05/14/2021,2359 Performed at [facility], [place] [address], [place], [place] [address]   Blood Cultures (routine x 2)     Status: None (Preliminary result)   Collection Time: 05/11/21 12:25 PM   Specimen: BLOOD  Result Value Ref Range   Specimen Description BLOOD LEFT ANTECUBITAL    Special Requests      BOTTLES DRAWN AEROBIC AND ANAEROBIC Blood Culture adequate volume   Culture      NO GROWTH < 24 HOURS Performed at [facility], [place] [address], [place],  [address]    Report Status PENDING   Troponin I (High Sensitivity)     Status: None   Collection Time: 05/11/21 12:25 PM  Result Value Ref Range   Troponin I (High Sensitivity) 9 <18 ng/L  Comment: (NOTE) Elevated high sensitivity troponin I (hsTnI) values and significant  changes across serial measurements may suggest ACS but many other  chronic and acute conditions are known to elevate hsTnI results.  Refer to the "Links" section for chest pain algorithms and additional  guidance. Performed at [facility], [place] [address] [place], [place] [address]   Resp Panel by RT-PCR (Flu A&B, Covid) Nasopharyngeal Swab     Status: None   Collection Time: 05/11/21  1:00 PM   Specimen: Nasopharyngeal Swab; Nasopharyngeal(NP) swabs in vial transport medium  Result Value Ref Range   SARS Coronavirus 2 by RT PCR NEGATIVE NEGATIVE    Comment:  (NOTE) SARS-CoV-2 target nucleic acids are NOT DETECTED.  The SARS-CoV-2 RNA is generally detectable in upper respiratory specimens during the acute phase of infection. The lowest concentration of SARS-CoV-2 viral copies this assay can detect is 138 copies/mL. A negative result does not preclude SARS-Cov-2 infection and should not be used as the sole basis for treatment or other patient management decisions. A negative result may occur with  improper specimen collection/handling, submission of specimen other than nasopharyngeal swab, presence of viral mutation(s) within the areas targeted by this assay, and inadequate number of viral copies(<138 copies/mL). A negative result must be combined with clinical observations, patient history, and epidemiological information. The expected result is Negative.  Fact Sheet for Patients:  EntrepreneurPulse.com.au  Fact Sheet for Healthcare Providers:  IncredibleEmployment.be  This test is no t yet approved or cleared by the [place] FDA and  has been authorized for detection and/or diagnosis of SARS-CoV-2 by FDA under an Emergency Use Authorization (EUA). This EUA will remain  in effect (meaning this test can be used) for the duration of the COVID-19 declaration under Section 564(b)(1) of the Act, 21 U.S.C.section 360bbb-3(b)(1), unless the authorization is terminated  or revoked sooner.       Influenza A by PCR NEGATIVE NEGATIVE   Influenza B by PCR NEGATIVE NEGATIVE    Comment: (NOTE) The Xpert Xpress SARS-CoV-2/FLU/RSV plus assay is intended as an aid in the diagnosis of influenza from Nasopharyngeal swab specimens and should not be used as a sole basis for treatment. Nasal washings and aspirates are unacceptable for Xpert Xpress SARS-CoV-2/FLU/RSV testing.  Fact Sheet for Patients: EntrepreneurPulse.com.au  Fact Sheet for Healthcare  Providers: IncredibleEmployment.be  This test is not yet approved or cleared by the [place] FDA and has been authorized for detection and/or diagnosis of SARS-CoV-2 by FDA under an Emergency Use Authorization (EUA). This EUA will remain in effect (meaning this test can be used) for the duration of the COVID-19 declaration under Section 564(b)(1) of the Act, 21 U.S.C. section 360bbb-3(b)(1), unless the authorization is terminated or revoked.  Performed at [facility], [place] [address], [place], [place] [address]   I-Stat venous blood gas, ED     Status: Abnormal   Collection Time: 05/11/21  1:08 PM  Result Value Ref Range   pH, Ven 7.350 7.250 - 7.430   pCO2, Ven 56.9 44.0 - 60.0 mmHg   pO2, Ven 140.0 (H) 32.0 - 45.0 mmHg   Bicarbonate 31.4 (H) 20.0 - 28.0 mmol/L   TCO2 33 (H) 22 - 32 mmol/L   O2 Saturation 99.0 %   Acid-Base Excess 5.0 (H) 0.0 - 2.0 mmol/L   Sodium 142 135 - 145 mmol/L   Potassium 3.6 3.5 - 5.1 mmol/L   Calcium, Ion 1.35 1.15 - 1.40 mmol/L   HCT 27.0 (L)  39.0 - 52.0 %   Hemoglobin 9.2 (L) 13.0 - 17.0 g/dL   Sample type VENOUS   I-Stat Chem 8, ED     Status: Abnormal   Collection Time: 05/11/21  1:09 PM  Result Value Ref Range   Sodium 142 135 - 145 mmol/L   Potassium 3.6 3.5 - 5.1 mmol/L   [place] 105 98 - 111 mmol/L   BUN 22 8 - 23 mg/dL   Creatinine, Ser 0.90 0.61 - 1.24 mg/dL   Glucose, Bld 124 (H) 70 - 99 mg/dL    Comment: Glucose reference range applies only to samples taken after fasting for at least 8 hours.   Calcium, Ion 1.32 1.15 - 1.40 mmol/L   TCO2 31 22 - 32 mmol/L   Hemoglobin 8.8 (L) 13.0 - 17.0 g/dL   HCT 26.0 (L) 39.0 - 52.0 %  Urinalysis, Routine w reflex microscopic Urine, Catheterized     Status: Abnormal   Collection Time: 05/11/21  1:45 PM  Result Value Ref Range   Color, Urine YELLOW YELLOW   APPearance CLEAR CLEAR   Specific Gravity, Urine 1.025 1.005 - 1.030   pH 6.0 5.0 - 8.0   Glucose, UA  NEGATIVE NEGATIVE mg/dL   Hgb urine dipstick MODERATE (A) NEGATIVE   Bilirubin Urine NEGATIVE NEGATIVE   Ketones, ur NEGATIVE NEGATIVE mg/dL   Protein, ur 30 (A) NEGATIVE mg/dL   Nitrite POSITIVE (A) NEGATIVE   Leukocytes,Ua SMALL (A) NEGATIVE    Comment: Performed at [facility], [place] [address] [place], [place] [address]  Urine rapid drug screen (hosp performed)     Status: Abnormal   Collection Time: 05/11/21  1:45 PM  Result Value Ref Range   Opiates POSITIVE (A) NONE DETECTED   Cocaine NONE DETECTED NONE DETECTED   Benzodiazepines NONE DETECTED NONE DETECTED   Amphetamines NONE DETECTED NONE DETECTED   Tetrahydrocannabinol NONE DETECTED NONE DETECTED   Barbiturates NONE DETECTED NONE DETECTED    Comment: (NOTE) DRUG SCREEN FOR MEDICAL PURPOSES ONLY.  IF CONFIRMATION IS NEEDED FOR ANY PURPOSE, NOTIFY LAB WITHIN 5 DAYS.  LOWEST DETECTABLE LIMITS FOR URINE DRUG SCREEN Drug Class                     Cutoff (ng/mL) Amphetamine and metabolites    1000 Barbiturate and metabolites    200 Benzodiazepine                 621 Tricyclics and metabolites     300 Opiates and metabolites        300 Cocaine and metabolites        300 THC                            50 Performed at [facility], [place] [address], [place], [place] [address]   Urinalysis, Microscopic (reflex)     Status: Abnormal   Collection Time: 05/11/21  1:45 PM  Result Value Ref Range   RBC / HPF 21-50 0 - 5 RBC/hpf   WBC, UA >50 0 - 5 WBC/hpf   Bacteria, UA RARE (A) NONE SEEN   Squamous Epithelial / LPF NONE SEEN 0 - 5   WBC Clumps PRESENT    Mucus PRESENT    Hyaline Casts, UA PRESENT     Comment: Performed at [facility] [address], [place], [place] [address]  Urine Culture  Status: Abnormal (Preliminary result)   Collection Time: 05/11/21  2:28 PM   Specimen: Urine, Clean Catch  Result Value Ref Range   Specimen Description URINE, CLEAN CATCH    Special Requests NONE    Culture  (A)     >=100,000 COLONIES/mL GRAM NEGATIVE RODS CULTURE REINCUBATED FOR BETTER GROWTH SUSCEPTIBILITIES TO FOLLOW Performed at [facility], [address] [place], [place] [address]    Report Status PENDING   Sedimentation rate     Status: Abnormal   Collection Time: 05/11/21  3:42 PM  Result Value Ref Range   Sed Rate 18 (H) 0 - 16 mm/hr    Comment: Performed at [facility] [address], [place], [place] [address]  TSH     Status: None   Collection Time: 05/11/21  5:20 PM  Result Value Ref Range   TSH 0.740 0.350 - 4.500 uIU/mL    Comment: Performed by a 3rd Generation assay with a functional sensitivity of <=0.01 uIU/mL. Performed at [facility], [place] [address] [place], [place] [address]   C-reactive protein     Status: None   Collection Time: 05/11/21  5:20 PM  Result Value Ref Range   CRP 0.6 <1.0 mg/dL    Comment: Performed at [facility] [address] [place], [place] [address]  Basic metabolic panel     Status: None   Collection Time: 05/12/21  3:20 AM  Result Value Ref Range   Sodium 137 135 - 145 mmol/L   Potassium 3.8 3.5 - 5.1 mmol/L   [place] 107 98 - 111 mmol/L   CO2 24 22 - 32 mmol/L   Glucose, Bld 92 70 - 99 mg/dL    Comment: Glucose reference range applies only to samples taken after fasting for at least 8 hours.   BUN 14 8 - 23 mg/dL   Creatinine, Ser 0.62 0.61 - 1.24 mg/dL   Calcium 9.0 8.9 - 10.3 mg/dL   GFR, Estimated >60 >60 mL/min    Comment: (NOTE) Calculated using the CKD-EPI Creatinine Equation (2021)    Anion gap 6 5 - 15    Comment: Performed at [facility] [address], [place], [place] [address]  CBC     Status: Abnormal   Collection Time: 05/12/21  3:20 AM  Result Value Ref Range   WBC 5.1 4.0 - 10.5 K/uL   RBC 3.18 (L) 4.22 - 5.81 MIL/uL   Hemoglobin 7.5 (L) 13.0 - 17.0 g/dL   HCT 25.6 (L) 39.0 - 52.0 %   MCV 80.5 80.0 - 100.0 fL   MCH 23.6 (L) 26.0 - 34.0 pg   MCHC 29.3 (L) 30.0 - 36.0  g/dL   RDW 17.2 (H) 11.5 - 15.5 %   Platelets 133 (L) 150 - 400 K/uL   nRBC 0.0 0.0 - 0.2 %    Comment: Performed at [facility], [place] [address], [place], [place] [address]  Hemoglobin and hematocrit, blood     Status: Abnormal   Collection Time: 05/12/21  4:53 AM  Result Value Ref Range   Hemoglobin 8.3 (L) 13.0 - 17.0 g/dL   HCT 27.9 (L) 39.0 - 52.0 %    Comment: Performed at [facility] [address], [place],  [address]  CBG monitoring, ED     Status: Abnormal   Collection Time: 05/12/21  8:36 AM  Result Value Ref Range   Glucose-Capillary 106 (H) 70 -  99 mg/dL    Comment: Glucose reference range applies only to samples taken after fasting for at least 8 hours.   CT Head Wo Contrast  Result Date: 05/11/2021 CLINICAL DATA:  Mental status change of unknown cause. Weakness and hypotension EXAM: CT HEAD WITHOUT CONTRAST TECHNIQUE: Contiguous axial images were obtained from the base of the skull through the vertex without intravenous contrast. COMPARISON:  04/06/2021 FINDINGS: Brain: Age related volume loss. No focal abnormality seen affecting the brainstem or cerebellum. Chronic small-vessel ischemic changes affect the cerebral hemispheric white matter. Old small vessel infarction of the right thalamus. No sign of acute infarction, mass lesion, hemorrhage, hydrocephalus or extra-axial collection. Vascular: There is atherosclerotic calcification of the major vessels at the base of the brain. Skull: Negative Sinuses/Orbits: Clear/normal Other: None IMPRESSION: No acute CT finding. Atrophy and chronic small-vessel ischemic change of the cerebral hemispheric white matter. Cerebral Atrophy (ICD10-G31.9). Electronically Signed   By: Nelson Chimes M.D.   On: 05/11/2021 12:57   DG Chest Portable 1 View  Result Date: 05/11/2021 CLINICAL DATA:  Hypotension.  Shortness of breath. EXAM: PORTABLE CHEST 1 VIEW COMPARISON:  03/22/2021 FINDINGS: The heart size and mediastinal contours  are within normal limits. Aortic atherosclerotic calcification noted. Prior CABG again noted. Dual lead transvenous pacemaker remains in appropriate position. Both lungs are clear. Skin fold again seen projecting over the right hemithorax. The visualized skeletal structures are unremarkable. IMPRESSION: No active disease. Electronically Signed   By: Marlaine Hind M.D.   On: 05/11/2021 12:40   DG Foot Complete Right  Result Date: 05/11/2021 CLINICAL DATA:  Possible sepsis.  Evaluate for osteomyelitis. 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 Calcaneal and Achilles spurs. No osseous destruction. No soft tissue gas. IMPRESSION: No evidence of acute osteomyelitis. Status post first phalanx and metatarsal amputation with similar hyperattenuation in the soft tissues of the surgical site. 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 This could be correlated with physical exam. Electronically Signed   By: Abigail Miyamoto M.D.   On: 05/11/2021 13:52    Pending Labs Unresulted Labs (From admission, onward)     Start     Ordered   05/18/21 0500  Creatinine, serum  (enoxaparin (LOVENOX)    CrCl >/= 30 ml/min)  Weekly,   R     Comments: while on enoxaparin therapy    05/11/21 1532   05/13/21 0500  CBC  Daily,   R      05/12/21 1043   05/13/21 0500  Magnesium  Tomorrow morning,   R        05/12/21 1043   05/12/21 0757  Magnesium  Add-on,   AD        05/12/21 0756            Vitals/Pain Today's Vitals   05/12/21 0811 05/12/21 0835 05/12/21 0930 05/12/21 1030  BP:  (!) 123/54 137/62 136/75  Pulse:  (!) 105 65   Resp:  16 (!) 21 19  Temp: 97.6 F (36.4 C)     TempSrc: Oral     SpO2:  100% 100%   PainSc:        Isolation Precautions No active  isolations  Medications Medications  enoxaparin (LOVENOX) injection 40 mg (40 mg Subcutaneous Given 05/11/21 1703)  potassium chloride 10 mEq  in 100 mL IVPB (0 mEq Intravenous Stopped 05/11/21 2205)  ceFEPIme (MAXIPIME) 2 g in sodium chloride 0.9 % 100 mL IVPB (0 g Intravenous Stopped 05/12/21 0841)  0.9 %  sodium chloride infusion ( Intravenous Rate/Dose Change 05/12/21 0508)  pantoprazole (PROTONIX) EC tablet 40 mg (40 mg Oral Given 05/12/21 0939)  clopidogrel (PLAVIX) tablet 75 mg (has no administration in time range)  divalproex (DEPAKOTE) DR tablet 125 mg (has no administration in time range)  docusate sodium (COLACE) capsule 100 mg (100 mg Oral Given 05/12/21 1054)  finasteride (PROSCAR) tablet 5 mg (5 mg Oral Given 05/12/21 1054)  rosuvastatin (CRESTOR) tablet 10 mg (10 mg Oral Given 05/12/21 1054)  vitamin B-12 (CYANOCOBALAMIN) tablet 1,000 mcg (1,000 mcg Oral Given 05/12/21 1054)  Vitamin D (Ergocalciferol) (DRISDOL) capsule 50,000 Units (has no administration in time range)  senna (SENOKOT) tablet 8.6 mg (has no administration in time range)  polyethylene glycol (MIRALAX / GLYCOLAX) packet 17 g (has no administration in time range)  sodium chloride 0.9 % bolus 1,000 mL (0 mLs Intravenous Stopped 05/11/21 1652)  cefTRIAXone (ROCEPHIN) 1 g in sodium chloride 0.9 % 100 mL IVPB (0 g Intravenous Stopped 05/11/21 1541)  sodium chloride 0.9 % bolus 500 mL (0 mLs Intravenous Stopped 05/12/21 0322)  potassium chloride 10 mEq in 100 mL IVPB (0 mEq Intravenous Stopped 05/11/21 2320)    Mobility manual wheelchair High fall risk   Focused Assessments Neuro Assessment Handoff:  Swallow screen pass? Yes  Cardiac Rhythm: Normal sinus rhythm       Neuro Assessment: Within Defined Limits Neuro Checks:      Last Documented NIHSS Modified Score:   Has TPA been given? No If patient is a Neuro Trauma and patient is going to OR before floor call report to [place] nurse: [phone] or  [phone]   R Recommendations: See Admitting Provider Note  Report given to: Carl Best  Additional Notes:

## 2021-05-12 NOTE — ED Notes (Signed)
Pt alert. Pt eating. Pt will not allow RN to obtain vitals while eating. RN to attempt again when finished.

## 2021-05-12 NOTE — Progress Notes (Signed)
1 out of 4 (aerobic) staph species (no resistance)PHARMACY - PHYSICIAN COMMUNICATION CRITICAL VALUE ALERT - BLOOD CULTURE IDENTIFICATION (BCID)  Todd Dunn is an 80 y.o. male who presented to [facility] on 05/11/2021 with a chief complaint of Osteomyelitis vs UTI  Assessment: 1 out of 4 blood cultures positive for staph species (no resistance) Of note, Ucx positive for pseudomonas and e. faecalis  Name of physician (or Provider) Contacted: Dr. Joelyn Oms  Current antibiotics: cefepime 2gm q12h  Changes to prescribed antibiotics recommended:  Adjust to zosyn 3.375gm q8hr to cover blood and urine cultures Recommendations accepted by provider  Results for orders placed or performed during the hospital encounter of 05/11/21  Blood Culture ID Panel (Reflexed) (Collected: 05/11/2021 12:25 PM)  Result Value Ref Range   Enterococcus faecalis NOT DETECTED NOT DETECTED   Enterococcus Faecium NOT DETECTED NOT DETECTED   Listeria monocytogenes NOT DETECTED NOT DETECTED   Staphylococcus species DETECTED (A) NOT DETECTED   Staphylococcus aureus (BCID) NOT DETECTED NOT DETECTED   Staphylococcus epidermidis NOT DETECTED NOT DETECTED   Staphylococcus lugdunensis NOT DETECTED NOT DETECTED   Streptococcus species NOT DETECTED NOT DETECTED   Streptococcus agalactiae NOT DETECTED NOT DETECTED   Streptococcus pneumoniae NOT DETECTED NOT DETECTED   Streptococcus pyogenes NOT DETECTED NOT DETECTED   A.calcoaceticus-baumannii NOT DETECTED NOT DETECTED   Bacteroides fragilis NOT DETECTED NOT DETECTED   Enterobacterales NOT DETECTED NOT DETECTED   Enterobacter cloacae complex NOT DETECTED NOT DETECTED   Escherichia coli NOT DETECTED NOT DETECTED   Klebsiella aerogenes NOT DETECTED NOT DETECTED   Klebsiella oxytoca NOT DETECTED NOT DETECTED   Klebsiella pneumoniae NOT DETECTED NOT DETECTED   Proteus species NOT DETECTED NOT DETECTED   Salmonella species NOT DETECTED NOT DETECTED   Serratia  marcescens NOT DETECTED NOT DETECTED   Haemophilus influenzae NOT DETECTED NOT DETECTED   Neisseria meningitidis NOT DETECTED NOT DETECTED   Pseudomonas aeruginosa NOT DETECTED NOT DETECTED   Stenotrophomonas maltophilia NOT DETECTED NOT DETECTED   Candida albicans NOT DETECTED NOT DETECTED   Candida auris NOT DETECTED NOT DETECTED   Candida glabrata NOT DETECTED NOT DETECTED   Candida krusei NOT DETECTED NOT DETECTED   Candida parapsilosis NOT DETECTED NOT DETECTED   Candida tropicalis NOT DETECTED NOT DETECTED   Cryptococcus neoformans/gattii NOT DETECTED NOT DETECTED    Thank you for allowing pharmacy to be a part of this patient's care.  Donnald Garre, PharmD Clinical Pharmacist  Please check AMION for all [place] numbers After 10:00 PM, call [place] [phone]

## 2021-05-12 NOTE — ED Notes (Signed)
Bair Hugger on medium additional blankets applied

## 2021-05-12 NOTE — ED Notes (Signed)
Pt had a BM, Pt changed and bedding changed

## 2021-05-12 NOTE — ED Notes (Addendum)
This RN attempted to get vitals on pt again. Pt raised arm and threatened to punch this RN, when asked if B/P could be taken prior to him continuing to eat. RN counseled pt on behavior, pt then allowed RN to take b/p.  Pt spilled coffee and food all over the floor. Evs notified to mop room. Pt  attempted to reposition in bed, pt would not allow RN. RN gave warm blankets. B.Hugger removed.

## 2021-05-13 DIAGNOSIS — I10 Essential (primary) hypertension: Secondary | ICD-10-CM

## 2021-05-13 LAB — CBC
HCT: 27.6 % — ABNORMAL LOW (ref 39.0–52.0)
Hemoglobin: 8.3 g/dL — ABNORMAL LOW (ref 13.0–17.0)
MCH: 23.4 pg — ABNORMAL LOW (ref 26.0–34.0)
MCHC: 30.1 g/dL (ref 30.0–36.0)
MCV: 78 fL — ABNORMAL LOW (ref 80.0–100.0)
Platelets: 158 10*3/uL (ref 150–400)
RBC: 3.54 MIL/uL — ABNORMAL LOW (ref 4.22–5.81)
RDW: 16.8 % — ABNORMAL HIGH (ref 11.5–15.5)
WBC: 4.8 10*3/uL (ref 4.0–10.5)
nRBC: 0 % (ref 0.0–0.2)

## 2021-05-13 LAB — MAGNESIUM: Magnesium: 2.1 mg/dL (ref 1.7–2.4)

## 2021-05-13 NOTE — Evaluation (Signed)
Occupational Therapy Evaluation Patient Details Name: Todd Dunn MRN: [id number] DOB: 1941/03/26 Today's Date: 05/13/2021   History of Present Illness 80 y.o. male presents to [facility] hospital on 05/11/2021 with weakness and hypotension. Pt found to have UTI. 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   Clinical Impression   Pt presents with decline in function and safety with ADLs and ADL mobility with impaired strength, balance, endurance and cognition.  Per pt notes, the pt has chronic deficits from prior CVA, resulting in L weakness and instability. Pt does not ambulate at home, rather utilizing a wheelchair for mobility. Pt requires prn assistance for LB ADLs  and transfer. Pt is min A with LB ADLs, set up/Sup with UB ADLs and is able to transfer with min A   +2 for safety and use of RW at this time. Pt would benefit from acute OT services to address impairments to maximize level of function and safety   Recommendations for follow up therapy are one component of a multi-disciplinary discharge planning process, led by the attending physician.  Recommendations may be updated based on patient status, additional functional criteria and insurance authorization.   Follow Up Recommendations  Home health OT    Assistance Recommended at Discharge Frequent or constant Supervision/Assistance  Functional Status Assessment  Patient has had a recent decline in their functional status and demonstrates the ability to make significant improvements in function in a reasonable and predictable amount of time.  Equipment Recommendations  Other (comment) (pt has all necessary DME at home)    Recommendations for Other Services       Precautions / Restrictions Precautions Precautions:  Fall Restrictions Weight Bearing Restrictions: No      Mobility Bed Mobility Overal bed mobility: Needs Assistance Bed Mobility: Supine to Sit;Sit to Supine     Supine to sit: Min guard Sit to supine: Min guard   General bed mobility comments: pt impulsive    Transfers Overall transfer level: Needs assistance Equipment used: Rolling walker (2 wheels);1 person hand held assist Transfers: Sit to/from Stand;Bed to chair/wheelchair/BSC Sit to Stand: Min assist;+2 safety/equipment     Step pivot transfers: Min assist;+2 safety/equipment     General transfer comment: mod verbal cues for safety      Balance Overall balance assessment: Needs assistance Sitting-balance support: No upper extremity supported;Feet supported Sitting balance-Leahy Scale: Poor Sitting balance - Comments: pt with one rightward loss of balance, significant instability initially upon PT arrival but improves to minG by end of session Postural control: Right lateral lean Standing balance support: Bilateral upper extremity supported;Reliant on assistive device for balance Standing balance-Leahy Scale: Poor Standing balance comment: minG with BUE support of RW                           ADL either performed or assessed with clinical judgement   ADL Overall ADL's : Needs assistance/impaired Eating/Feeding: Independent;Set up   Grooming: Wash/dry hands;Wash/dry face;Min guard;Standing   Upper Body Bathing: Supervision/ safety;Set up;Sitting   Lower Body Bathing: Minimal assistance;Sit to/from stand   Upper Body Dressing : Supervision/safety;Set up;Sitting   Lower Body Dressing: Minimal assistance;Sit to/from stand   Toilet Transfer: Minimal assistance;+2 for safety/equipment;Ambulation;Stand-pivot;Regular Toilet;Cueing for safety;Cueing for sequencing;Grab bars;Rolling walker (2 wheels)  Toileting- Clothing Manipulation and Hygiene: Minimal assistance;Sit to/from stand;Cueing for safety        Functional mobility during ADLs: Minimal assistance;+2 for physical assistance;Cueing for safety;Cueing for sequencing;Rolling walker (2 wheels)       Vision Baseline Vision/History: 1 Wears glasses Ability to See in Adequate Light: 0 Adequate       Perception     Praxis      Pertinent Vitals/Pain Pain Assessment: Faces Faces Pain Scale: No hurt Pain Intervention(s): Monitored during session     Hand Dominance Right   Extremity/Trunk Assessment Upper Extremity Assessment Upper Extremity Assessment: Generalized weakness;LUE deficits/detail LUE Deficits / Details: ROM WFL, strength 4/5 grossly   Lower Extremity Assessment Lower Extremity Assessment: LLE deficits/detail;RLE deficits/detail RLE Deficits / Details: ROM WFL, strength 4/5, R foot dressing intact and dry RLE Coordination: decreased gross motor LLE Deficits / Details: ROM WFL, strength grossly 4-/5 LLE Coordination: decreased gross motor   Cervical / Trunk Assessment Cervical / Trunk Assessment: Normal   Communication Communication Communication: Expressive difficulties   Cognition Arousal/Alertness: Awake/alert Behavior During Therapy: Impulsive Overall Cognitive Status: History of cognitive impairments - at baseline                                 General Comments: pt is alert and oriented to person, place. Pt follows one-step commands with increased time and intermittent tactile cueing. Pt demonstrates reduced awareness of deficits and safety, pt is easily distracted     General Comments  VSS on RA    Exercises     Shoulder Instructions      Home Living Family/patient expects to be discharged to:: Private residence Living Arrangements: Other relatives Available Help at Discharge: Family;Available 24 hours/day Type of Home: House Home Access: Stairs to enter [employer] of Steps: 2   Home Layout: One level     Bathroom Shower/Tub: Tub/shower  unit;Curtain;Walk-in shower   Bathroom Toilet: Standard Bathroom Accessibility: Yes   Home Equipment: Conservation officer, nature (2 wheels);Wheelchair - manual;Grab bars - tub/shower   Additional Comments: information obtained from previous admission, granddaughter confirms one level with 2 steps over phone      Prior Functioning/Environment Prior Level of Function : Needs assist  Cognitive Assist : ADLs (cognitive);Mobility (cognitive) Mobility (Cognitive): Intermittent cues ADLs (Cognitive): Intermittent cues Physical Assist : Mobility (physical);ADLs (physical) Mobility (physical): Transfers;Bed mobility ADLs (physical): Grooming;Bathing;Dressing;Toileting;IADLs            OT Problem List: Decreased strength;Impaired balance (sitting and/or standing);Decreased cognition;Decreased knowledge of precautions;Decreased safety awareness;Decreased activity tolerance;Decreased knowledge of use of DME or AE;Decreased coordination      OT Treatment/Interventions: Self-care/ADL training;Therapeutic exercise;Neuromuscular education;Balance training;Therapeutic activities;DME and/or AE instruction    OT Goals(Current goals can be found in the care plan section) Acute Rehab OT Goals Patient Stated Goal: go home OT Goal Formulation: With patient Time For Goal Achievement: 05/27/21 Potential to Achieve Goals: Good ADL Goals Pt Will Perform Grooming: with supervision;with set-up;standing Pt Will Perform Upper Body Bathing: with set-up;with modified independence;sitting Pt Will Perform Lower Body Bathing: with min guard assist;sit to/from stand;with caregiver independent in assisting Pt Will Perform Upper Body Dressing: with set-up;with modified independence;sitting Pt Will Perform Lower Body Dressing: with min guard assist;sit to/from stand;with caregiver independent in assisting Pt Will Transfer to Toilet: with min guard assist;ambulating Pt Will Perform Toileting - Clothing Manipulation and hygiene:  with min guard assist;with supervision;sit to/from stand;with caregiver independent in assisting Pt  Will Perform Tub/Shower Transfer: with min guard assist;ambulating;grab bars;rolling walker;shower seat  OT Frequency: Min 2X/week   Barriers to D/C:            Co-evaluation              AM-PAC OT "6 Clicks" Daily Activity     Outcome Measure Help from another person eating meals?: None Help from another person taking care of personal grooming?: A Little Help from another person toileting, which includes using toliet, bedpan, or urinal?: A Little Help from another person bathing (including washing, rinsing, drying)?: A Little Help from another person to put on and taking off regular upper body clothing?: A Little Help from another person to put on and taking off regular lower body clothing?: A Little 6 Click Score: 19   End of Session Equipment Utilized During Treatment: Gait belt;Rolling walker (2 wheels)  Activity Tolerance: Patient tolerated treatment well Patient left: in bed;with call bell/phone within reach;with bed alarm set;with nursing/sitter in room  OT Visit Diagnosis: Unsteadiness on feet (R26.81);Other abnormalities of gait and mobility (R26.89);Muscle weakness (generalized) (M62.81);Other symptoms and signs involving cognitive function                Time: 6314-9702 OT Time Calculation (min): 36 min Charges:  OT General Charges $OT Visit: 1 Visit OT Evaluation $OT Eval Low Complexity: 1 Low OT Treatments $Self Care/Home Management : 8-22 mins $Therapeutic Activity: 8-22 mins  Britt Bottom 05/13/2021, 12:21 PM

## 2021-05-13 NOTE — Progress Notes (Signed)
FPTS Brief Progress Note  S: Patient seen at bedside for evening rounds. He denies complaints. Requesting 2 additional blankets for sleep.  Spoke with primary RN, Raquel Sarna, who has no concerns at this time.  O: BP (!) 138/50 (BP Location: Right Arm)   Pulse 71   Temp 97.9 F (36.6 C) (Oral)   Resp 16   Wt 63.6 kg   SpO2 97%   BMI 20.12 kg/m   Gen: alert, NAD Resp: normal work of breathing Neuro: pleasant, oriented to self only, thinks he's in [place] and the year is 2004.  A/P: Patient remains stable. Mental status at baseline. -Continue current management per daily progress note.  -Treating complicated UTI w/IV Zosyn.  -Monitoring foot wound.  - Orders reviewed. Labs for AM ordered, which was adjusted as needed.    Alcus Dad, MD 05/13/2021, 9:56 PM PGY-2, [facility] Night Resident  Please page [phone] with questions.

## 2021-05-13 NOTE — Evaluation (Signed)
Physical Therapy Evaluation Patient Details Name: Todd Dunn MRN: [id number] DOB: 02/25/1941 Today's Date: 05/13/2021  History of Present Illness  80 y.o. male presents to [facility] hospital on 05/11/2021 with weakness and hypotension. Pt found to have UTI. 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  Clinical Impression  Pt presents to PT with deficits in functional mobility, gait, balance, power, coordination, endurance, cognition, communication. Per conversation with pt's granddaughter, the pt has chronic deficits from prior CVA, resulting in L weakness and instability. Pt does not ambulate at home, rather utilizing a wheelchair for mobility. Pt requires PRN assistance for transfers and ADLs. Pt is able to transfer with minA and use of RW at this time, likely not far from baseline per family report. PT recommends discharge home with HHPT. PT recommends SPT from bed to bedside commode with assist of nursing staff.     Recommendations for follow up therapy are one component of a multi-disciplinary discharge planning process, led by the attending physician.  Recommendations may be updated based on patient status, additional functional criteria and insurance authorization.  Follow Up Recommendations Home health PT    Assistance Recommended at Discharge Frequent or constant Supervision/Assistance  Functional Status Assessment Patient has had a recent decline in their functional status and demonstrates the ability to make significant improvements in function in a reasonable and predictable amount of time.  Equipment Recommendations  None recommended by PT (pt owns necessary DME)    Recommendations for Other Services       Precautions / Restrictions Precautions Precautions:  Fall Restrictions Weight Bearing Restrictions: No      Mobility  Bed Mobility Overal bed mobility: Needs Assistance Bed Mobility: Supine to Sit;Sit to Supine     Supine to sit: Min guard Sit to supine: Min guard        Transfers Overall transfer level: Needs assistance Equipment used: Rolling walker (2 wheels);1 person hand held assist Transfers: Sit to/from Stand;Bed to chair/wheelchair/BSC Sit to Stand: Min assist;Mod assist;From elevated surface   Step pivot transfers: Mod assist       General transfer comment: pt requires modA to stand with PT assist, minA with use of walker. modA to guide during step-pivot transfers, improved stability with walker    Ambulation/Gait Ambulation/Gait assistance: Max assist Gait Distance (Feet): 7 Feet Assistive device: Rolling walker (2 wheels) Gait Pattern/deviations: Step-through pattern;Decreased dorsiflexion - left;Scissoring Gait velocity: reduced Gait velocity interpretation: <1.31 ft/sec, indicative of household ambulator   General Gait Details: pt with slowed step-through gait, L foot drag, highly variable stance width with intermittent scissoring. Pt with multiple losses of balance resulting in PT assistance to prevent multiple potential falls  Stairs            Wheelchair Mobility    Modified Rankin (Stroke Patients Only)       Balance Overall balance assessment: Needs assistance Sitting-balance support: No upper extremity supported;Feet supported Sitting balance-Leahy Scale: Poor Sitting balance - Comments: pt with one rightward loss of balance, significant instability initially upon PT arrival but improves to minG by end of session Postural control: Right lateral lean Standing balance support: Bilateral upper extremity supported;Reliant on assistive device for balance Standing balance-Leahy Scale: Poor Standing balance comment: minG with BUE support of RW  Pertinent Vitals/Pain Pain Assessment: Faces Faces Pain Scale: No hurt    Home Living Family/patient expects to be discharged to:: Private residence Living Arrangements: Other relatives (granddaughter) Available Help at Discharge: Family;Available 24 hours/day Type of Home: House Home Access: Stairs to enter   [employer] of Steps: 2   Home Layout: One level Home Equipment: Conservation officer, nature (2 wheels);Wheelchair - manual;Grab bars - tub/shower Additional Comments: information obtained from previous admission, granddaughter confirms one level with 2 steps over phone    Prior Function Prior Level of Function : Needs assist  Cognitive Assist : ADLs (cognitive);Mobility (cognitive) Mobility (Cognitive): Intermittent cues   Physical Assist : Mobility (physical);ADLs (physical) Mobility (physical): Transfers;Gait;Stairs ADLs (physical): Grooming;Bathing;Dressing;Toileting;IADLs         Hand Dominance   Dominant Hand: Right    Extremity/Trunk Assessment   Upper Extremity Assessment Upper Extremity Assessment: LUE deficits/detail LUE Deficits / Details: ROM WFL, strength 4/5 grossly    Lower Extremity Assessment Lower Extremity Assessment: LLE deficits/detail;RLE deficits/detail RLE Deficits / Details: ROM WFL, strength 4/5, R foot dressing intact and dry RLE Coordination: decreased gross motor LLE Deficits / Details: ROM WFL, strength grossly 4-/5 LLE Coordination: decreased gross motor    Cervical / Trunk Assessment Cervical / Trunk Assessment: Normal  Communication   Communication: Expressive difficulties  Cognition Arousal/Alertness: Awake/alert Behavior During Therapy: Impulsive Overall Cognitive Status: History of cognitive impairments - at baseline                                 General Comments: pt is alert and oriented to person, place. Pt follows one-step commands with increased time and intermittent tactile cueing. Pt  demonstrates reduced awareness of deficits and safety.        General Comments General comments (skin integrity, edema, etc.): VSS on RA    Exercises     Assessment/Plan    PT Assessment Patient needs continued PT services  PT Problem List Decreased strength;Decreased activity tolerance;Decreased mobility;Decreased balance;Decreased cognition;Decreased coordination;Decreased knowledge of use of DME;Decreased safety awareness;Decreased knowledge of precautions       PT Treatment Interventions DME instruction;Gait training;Functional mobility training;Therapeutic activities;Therapeutic exercise;Balance training;Stair training;Neuromuscular re-education;Cognitive remediation;Wheelchair mobility training;Patient/family education    PT Goals (Current goals can be found in the Care Plan section)  Acute Rehab PT Goals Patient Stated Goal: to return home PT Goal Formulation: With patient/family Time For Goal Achievement: 05/27/21 Potential to Achieve Goals: Fair    Frequency Min 3X/week   Barriers to discharge        Co-evaluation               AM-PAC PT "6 Clicks" Mobility  Outcome Measure Help needed turning from your back to your side while in a flat bed without using bedrails?: A Little Help needed moving from lying on your back to sitting on the side of a flat bed without using bedrails?: A Little Help needed moving to and from a bed to a chair (including a wheelchair)?: A Lot Help needed standing up from a chair using your arms (e.g., wheelchair or bedside chair)?: A Lot Help needed to walk in hospital room?: Total Help needed climbing 3-5 steps with a railing? : Total 6 Click Score: 12    End of Session   Activity Tolerance: Patient tolerated treatment well Patient left: in bed;with call bell/phone within reach;with bed alarm set Nurse Communication: Mobility status PT Visit Diagnosis: Other abnormalities of gait  and mobility (R26.89);Unsteadiness on feet  (R26.81)    Time: 3235-5732 PT Time Calculation (min) (ACUTE ONLY): 29 min   Charges:   PT Evaluation $PT Eval Low Complexity: 1 Low PT Treatments $Therapeutic Activity: 8-22 mins        Zenaida Niece, PT, DPT Acute Rehabilitation Pager: [phone] Office [phone]   Zenaida Niece 05/13/2021, 9:33 AM

## 2021-05-13 NOTE — Progress Notes (Addendum)
FMTS Attending Daily Note: Todd Savannah, MD  Team Pager [phone] Pager [phone] I have seen and examined this patient, reviewed their chart. I have discussed this patient with the resident. I agree with the resident's findings, assessment and care plan.   Family Medicine Teaching Service Daily Progress Note Intern Pager: [phone]  Patient name: Todd Dunn Medical record number: [id number] Date of birth: May 14, 1941 Age: 80 y.o. Gender: male  Primary Care Provider: Fredrich Dunn, [place] Consultants: None  Code Status: FULL  Pt Overview and Major Events to Date:  12/5 Admission to FPTS   Assessment and Plan: Todd Dunn is a 80 y.o. male presenting with Weakness and Hypotension who was admitted for management of possible sepsis. 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   Acute metabolic encephalopathy, resolved dementia Resolved. Patient is back to baseline with mentation. He is alert, and oriented to location but not year. PT and OT have recommended HH PT/OT. Patient is medically stable and back to baseline. Possible discharge home tomorrow.  - cont delirium precautions   Foot Wound s/p amputation 2cm x 1.5 cm 0.5 cm in depth, chronic foot wound, serous drainage. Patient denies pain when dressing was changed and applied. No erythema and swelling around the wound or signs of active infection.  - will continue to monitor, no sign of infection - continue dressing changes   Complicated UTI  Neurogenic Bladder w/ chronic Foley Sepsis 2/2 UTI 2/2 foley, resolved, hypotension/hypothermia resolved  Stable on antibiotics. Patient refused prostate exam for r/o prostatitis. Urine culture growing >100,000 cfu pseudomonas and enterococcus faecalis. Sensitivities pending,  but discussed with pharmacy who recommended switching to Zosyn - cont antibiotics, IV cefepime  - cont to discuss DRE   HFrEF (EF 25-35%)  Stable  - cont home meds   FEN/GI: Regular  PPx: Lovenox  Dispo:Pending PT recs   Subjective:  Patient is resting comfortably and back to baseline with mentation. He is oriented to self. He has no new complaints at this time.   Objective: Temp:  [97.6 F (36.4 C)-98 F (36.7 C)] 97.9 F (36.6 C) (12/07 0743) Pulse Rate:  [59-70] 70 (12/07 1213) Resp:  [14-21] 14 (12/07 1213) BP: (133-162)/(63-83) 145/81 (12/07 1213) SpO2:  [95 %-100 %] 95 % (12/07 1213) Weight:  [63.6 kg] 63.6 kg (12/07 0453) Physical Exam: General: confused, NAD  Cardiovascular: regular rate and rhythm, no murmurs  Respiratory: clear, no wheezing  Abdomen: soft, non-tender  Extremities: no peripheral edema, stable right foot wound  Laboratory: Recent Labs  Lab 05/11/21 1145 05/11/21 1308 05/12/21 0320 05/12/21 0453 05/13/21 0102  WBC 4.8  --  5.1  --  4.8  HGB 8.6*   < > 7.5* 8.3* 8.3*  HCT 29.6*   < > 25.6* 27.9* 27.6*  PLT 185  --  133*  --  158   < > = values in this interval not displayed.   Recent Labs  Lab 05/11/21 1145 05/11/21 1308 05/11/21 1309 05/12/21 0320  NA 140 142 142 137  K 3.4* 3.6 3.6 3.8  CL 106  --  105 107  CO2 28  --   --  24  BUN 15  --  22 14  CREATININE 0.87  --  0.90 0.62  CALCIUM 10.1  --   --  9.0  PROT 6.8  --   --   --   BILITOT 0.3  --   --   --   ALKPHOS 126  --   --   --   ALT 14  --   --   --   AST 14*  --   --   --   GLUCOSE 88  --  124* 92    Todd Dunn, Medical Student 05/13/2021, 4:08 PM OMS4, [place] Intern pager: [phone], text pages welcome

## 2021-05-14 ENCOUNTER — Ambulatory Visit: Payer: Medicare Other

## 2021-05-14 ENCOUNTER — Other Ambulatory Visit (HOSPITAL_COMMUNITY): Payer: Self-pay

## 2021-05-14 DIAGNOSIS — N3001 Acute cystitis with hematuria: Secondary | ICD-10-CM

## 2021-05-14 DIAGNOSIS — N390 Urinary tract infection, site not specified: Secondary | ICD-10-CM

## 2021-05-14 DIAGNOSIS — G928 Other toxic encephalopathy: Secondary | ICD-10-CM

## 2021-05-14 LAB — CULTURE, BLOOD (ROUTINE X 2): Special Requests: ADEQUATE

## 2021-05-14 LAB — URINE CULTURE: Culture: >=100000 — AB

## 2021-05-14 MED ORDER — AMOXICILLIN 500 MG PO CAPS
500.0000 mg | ORAL_CAPSULE | Freq: Three times a day (TID) | ORAL | 0 refills | Status: AC
  Filled 2021-05-14: qty 15, 5d supply, fill #0

## 2021-05-14 MED ORDER — POLYETHYLENE GLYCOL 3350 17 G PO PACK: 17.0000 g | PACK | Freq: Every day | ORAL | 0 refills | Status: DC | PRN

## 2021-05-14 MED ORDER — AMOXICILLIN 500 MG PO CAPS
500.0000 mg | ORAL_CAPSULE | Freq: Three times a day (TID) | ORAL | Status: DC
  Administered 2021-05-14: 500 mg via ORAL
  Filled 2021-05-14 (×3): qty 1

## 2021-05-14 MED ORDER — FOSFOMYCIN TROMETHAMINE 3 G PO PACK
3.0000 g | PACK | Freq: Once | ORAL | Status: AC
  Administered 2021-05-14: 3 g via ORAL
  Filled 2021-05-14: qty 3

## 2021-05-14 NOTE — TOC Initial Note (Signed)
Transition of Care [facility]) - Initial/Assessment Note    Patient Details  Name: Todd Dunn MRN: [id number] Date of Birth: 1941/01/02  Transition of Care [facility]) CM/SW Contact:    Verdell Carmine, RN Phone Number: 05/14/2021, 3:54 PM  Clinical Narrative:                 80 yo patient patient admitted with hypotension and weakness. History of CVA with left residual hemiparesis.  [facility],  Patient is being DC and [facility] aware of his DC.  He was active with RN, PT SLP, aide and social work. Will call to see who can transport home Expected Discharge Plan: [place] Barriers to Discharge: Continued Medical Work up   Patient Goals and CMS Choice        Expected Discharge Plan and Services Expected Discharge Plan: [place]   Discharge Planning Services: CM Consult   Living arrangements for the past 2 months: Single Family Home Expected Discharge Date: 05/14/21                         HH Arranged: PT, OT          Prior Living Arrangements/Services Living arrangements for the past 2 months: Single Family Home   Patient language and need for interpreter reviewed:: Yes        Need for Family Participation in Patient Care: Yes (Comment) Care giver support system in place?: Yes (comment)   Criminal Activity/Legal Involvement Pertinent to Current Situation/Hospitalization: No - Comment as needed  Activities of Daily Living      Permission Sought/Granted                  Emotional Assessment       Orientation: : Oriented to Self, Oriented to Place, Oriented to  Time Alcohol / Substance Use: Not Applicable Psych Involvement: No (comment)  Admission diagnosis:  Hypothermia, initial encounter [T68.XXXA] Hypotension, unspecified hypotension type [I95.9] Urinary tract infection without hematuria, site unspecified [N39.0] Altered mental status, unspecified altered mental status type  [R41.82] AMS (altered mental status) [R41.82] Patient Active Problem List   Diagnosis Date Noted   AMS (altered mental status) 05/11/2021   Hypotension 05/11/2021   Iron deficiency anemia 05/07/2021   Oral phase dysphagia 04/28/2021   Protein-calorie malnutrition, severe 03/27/2021   Toe infection    PVD (peripheral vascular disease) ([place])    Osteomyelitis of great toe of right foot ([place]) 03/20/2021   Wound infection 03/20/2021   Pacemaker 03/20/2021   Elevated alkaline phosphatase level 03/20/2021   Hyperlipidemia 03/20/2021   Hematuria 02/14/2021   Urinary tract infection associated with indwelling urethral catheter ([place])    Normocytic anemia 01/06/2021   Catheter-associated urinary tract infection ([place]) 58/85/0277   Toxic metabolic encephalopathy 41/28/7867   Chronic indwelling Foley catheter 12/15/2020   HTN (hypertension) 12/15/2020   History of ESBL E. coli infection 12/15/2020   BPH (benign prostatic hyperplasia) 67/20/9470   Chronic systolic heart failure ([place]) 12/12/2019   PCP:  Fredrich Romans, [place] Pharmacy:   [facility] [address] [place] [address] Phone: [phone] Fax: [phone]  [facility] [address] [address] Phone: [phone] Fax: [phone]     Social Determinants of Health (SDOH) Interventions    Readmission  Risk Interventions Readmission Risk Prevention Plan 02/17/2021  Transportation Screening Complete  Medication Review Press photographer) Complete  PCP or Specialist appointment within 3-5 days of discharge Complete  HRI or [place] Complete  SW Recovery Care/Counseling Consult Complete  [place] Not Applicable

## 2021-05-14 NOTE — Progress Notes (Addendum)
Family Medicine Teaching Service Daily Progress Note Intern Pager: [phone]  Patient name: Todd Dunn Medical record number: [id number] Date of birth: [birth year] Age: 80 y.o. Gender: male  Primary Care Provider: Fredrich Romans, [place] Consultants: ID pharmacists, urology Code Status: Full Code   Pt Overview and Major Events to Date:  12/5: Admission  Assessment and Plan: Iban Utz Buch is a 80 y.o. male presenting with Weakness and Hypotension who was admitted for management of possible sepsis. 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  Acute metabolic encephalopathy, RESOLVED  Suspect dementia, stable Confirm with family patients at baseline mentation. Delirium, aspiration, fall precautions PT/OT B12 supplement daily  Complicated UTI, stable  Sepsis, RESOLVED  neurogenic bladder with chronic indwelling Foley catheter Patient continues to endorse dysuria with 4/10 pain, with 10/10 pain on admission. Afebrile and WBC 4.8. There was concern for prostatitis, however patient refused DRE. Encouraged patient to follow-up with this outpatient. 1/2 of blood cultures grew staph capitis, most likely contaminant. Urine Cx grew pseudomonas and VRE.  Currently on IV Zosyn day 3 plan to transition to PO antimicrobial, fosfomycin 3g x1 dose on day of dc and amoxicillin 500mg  TID x5days (end 12/13), per ID pharmacist, appreciate recs Patient refused AM CBC  Osteomyelitis of right great toe foot s/p amputation, stable Patient denied pain, tenderness on palpation, no erythema, no bleeding, serosanguinous drainage. Afebrile and last WBC 4.8.  Continue daily wound care Continue daily dressing changes Have patient keep orthopedic appointment  BPH BP has normalized and is currently  stable. Considering d/c home tamsulosin 0.4 mg daily and finasteride as patient has indwelling foley, that is likely to remain given neurogenic bladder and failed trials in the past. Per urology, patient's tamsulosin is ok to be discontinued as patient will keep the foley long-term, but to keep the finasteride to keep prostate smaller for easier foley changes.  Urology consult Continue home finasteride 5 mg daily   HFrEF (EF 25-35%, Oct 2022) SSS s/p MDT ICD Wt is 63.6 kg. UOP1.8 L. K+ 3.8. Patient is currently not hypervolemic on exam, satting well on room air. Continue to hold home Lasix 20 mg daily Strict I/Os and daily weights  Stable & chronic: IDA: Patient refused PO iron supplement CAD/CABG 2014  hx of CVA with residual left hemiplegia  PAD s/p right great toe amputation: Plavix 75 mg daily, rosuvastatin 10 mg daily HTN: Continue to hold home amlodipine 10 mg daily  FEN/GI: Regular PPx: enoxaparin (LOVENOX) injection 40 mg Start: 05/11/21 1600 Dispo:Home with home health PT/OT, without DME. Patient is stable for DC.  Subjective:  Todd Dunn was seen this AM. Stated that he is feeling better, although he still has some dysuria.  Otherwise denies any acute concerns.  Objective: Temp:  [97.9 F (36.6 C)-98 F (36.7 C)] 98 F (36.7 C) (12/08 0011) Pulse Rate:  [60-71] 67 (12/08 0422) Resp:  [14-20] 17 (12/08 0422) BP: (115-145)/(50-81) 135/61 (12/08 0422) SpO2:  [95 %-99 %] 99 % (12/08 0011) Physical Exam Vitals and nursing note reviewed.  Constitutional:      General: He is awake. He is not in acute distress.    Appearance: He is cachectic. He is not toxic-appearing or diaphoretic.  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  Eyes:     General: Scleral icterus present.  Cardiovascular:     Rate and Rhythm: Normal rate and regular  rhythm.     Heart sounds: Murmur heard.  Systolic murmur is present with a grade of 2/6.    No friction rub. No gallop.     Comments: MDT ICD visible under the skin Pulmonary:     Effort: Pulmonary effort is normal. No tachypnea or respiratory distress.     Breath sounds: Decreased air movement and transmitted upper airway sounds present. Decreased breath sounds and wheezing (diffuse expiratory) present. No rhonchi or rales.  Abdominal:     General: Abdomen is flat.     Palpations: Abdomen is soft.     Tenderness: There is no abdominal tenderness. There is no guarding or rebound.  Musculoskeletal:     Cervical back: Normal range of motion.     Right lower leg: No edema.     Left lower leg: No edema.  Feet:     Comments: Amputated right great toe, minimal serosanguineous drainage, nontender on palpation, no surrounding erythema, no bleeding Skin:    General: Skin is warm and dry.  Neurological:     Mental Status: He is alert. Mental status is at baseline.     Cranial Nerves: Dysarthria present. No facial asymmetry.     Motor: No tremor.     Comments: Dysarthric speech, selective mutism, is patient's baseline confirmed by family  Psychiatric:        Behavior: Behavior is cooperative.    Laboratory: Recent Labs  Lab 05/11/21 1145 05/11/21 1308 05/12/21 0320 05/12/21 0453 05/13/21 0102  WBC 4.8  --  5.1  --  4.8  HGB 8.6*   < > 7.5* 8.3* 8.3*  HCT 29.6*   < > 25.6* 27.9* 27.6*  PLT 185  --  133*  --  158   < > = values in this interval not displayed.   Recent Labs  Lab 05/11/21 1145 05/11/21 1308 05/11/21 1309 05/12/21 0320  NA 140 142 142 137  K 3.4* 3.6 3.6 3.8  CL 106  --  105 107  CO2 28  --   --  24  BUN 15  --  22 14  CREATININE 0.87  --  0.90 0.62  CALCIUM 10.1  --   --  9.0  PROT 6.8  --   --   --   BILITOT 0.3  --   --   --   ALKPHOS 126  --   --   --   ALT 14  --   --   --   AST 14*  --   --   --   GLUCOSE 88  --  124* 92   No results found for this  or any previous visit (from the past 24 hour(s)).  Imaging/Diagnostic Tests: No results found.   Merrily Brittle, DO 05/14/2021, 7:08 AM PGY-1, [place] Intern pager: [phone], text pages welcome

## 2021-05-14 NOTE — Discharge Instructions (Addendum)
Dear Todd Dunn, I am so glad you are feeling better and can be discharged today (05/14/2021)! You were admitted because of UTI (urinary tract infection) that made your blood pressure and temperature go down (hypotension & hypothermia).   Please see the following instructions: Please see your primary care / family doctor & urologist within 1 WEEK of leaving the hospital for a [place]. For your: UTI DAILY temperature for fevers, please call the hospital if patient has: fever 100 F, extra sleepy, or seems confused Medicines: Amoxicillin 500 mg 3 times daily, for 5 days (last dose will be 05/19/2021) Possible causes: Having a Foley catheter makes a person much more likely to get UTI, especially if they are not changed frequently. Please see your primary care doctor and urologist to help manage the recurring UTI.  Please ask if changing the Foley more than once every 3 months would help. He may have inflammation of his prostate gland, which may contribute to his frequent UTIs.  Please talk to your doctors about a prostate exam, patient refused during hospitalization. Medicines to STOP: Tamsulosin Medicines to STOP UNTIL you see your doctors: Amlodipine and Lasix Otherwise please continue your other home medicines as you are before coming to the hospital.  It was a pleasure meeting you, Todd Dunn.  I wish you and your family the best, and hope you stay happy and healthy!  Merrily Brittle, DO 05/14/2021

## 2021-05-14 NOTE — Discharge Summary (Signed)
[facility] Discharge Summary  Patient name: [place] record number: [id number] Date of birth: 08-11-1940 Age: 80 y.o. Gender: male Date of Admission: 05/11/2021  Date of Discharge: 05/14/2021 Admitting Physician: Lenoria Chime, MD  Primary Care Provider: Fredrich Romans, [place] Consultants: ID pharmacist, urology  Indication for Hospitalization:  Complicated UTI concerning for sepsis, with hypotension, hypothermia and AMS  Discharge Diagnoses/Problem List:  Principal Problem:   Urinary tract infection associated with indwelling urethral catheter ([place]) Active Problems:   Toxic metabolic encephalopathy   HTN (hypertension)   Iron deficiency anemia   AMS (altered mental status)   Hypotension    Disposition:  PT/OT: HH PT/OT, SPT from bed to bedside, no DME   Discharge Condition:  Stable   Discharge Exam:  Temp:  [97.4 F (36.3 C)-98.1 F (36.7 C)] 97.4 F (36.3 C) (12/08 1209) Pulse Rate:  [60-71] 64 (12/08 1209) Resp:  [12-20] 19 (12/08 1209) BP: (115-139)/(50-79) 139/68 (12/08 1209) SpO2:  [97 %-100 %] 100 % (12/08 1209)    Physical Exam Vitals and nursing note reviewed.  Constitutional:      General: He is awake. He is not in acute distress.    Appearance: He is cachectic. He is not toxic-appearing or diaphoretic.  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  Eyes:     General: Scleral icterus present.  Cardiovascular:     Rate and Rhythm: Normal rate and regular rhythm.     Heart sounds: Murmur heard.  Systolic murmur is present with a grade of 2/6.    No friction rub. No gallop.     Comments: MDT ICD visible under the skin Pulmonary:     Effort: Pulmonary effort is normal. No tachypnea or respiratory distress.     Breath sounds: Decreased air movement and transmitted upper airway  sounds present. Decreased breath sounds and wheezing (diffuse expiratory) present. No rhonchi or rales.  Abdominal:     General: Abdomen is flat.     Palpations: Abdomen is soft.     Tenderness: There is no abdominal tenderness. There is no guarding or rebound.  Musculoskeletal:     Cervical back: Normal range of motion.     Right lower leg: No edema.     Left lower leg: No edema.  Feet:     Comments: Amputated right great toe, minimal serosanguineous drainage, nontender on palpation, no surrounding erythema, no bleeding Skin:    General: Skin is warm and dry.  Neurological:     Mental Status: He is alert. Mental status is at baseline.     Cranial Nerves: Dysarthria present. No facial asymmetry.     Motor: No tremor.     Comments: Dysarthric speech, selective mutism, is patient's baseline confirmed by family  Psychiatric:        Behavior: Behavior is cooperative.   Brief Hospital Course:  FADEL CLASON is a 80 y.o. male presenting with Weakness and Hypotension who was admitted to the [facility] at [facility] for management of complicated UTI sepsis. 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 Others:  BPH, Vit D deficiency  ED Course: Hypotensive and hypothermic on arrival, so placed in [facility]. On exam his speech was latent and garbled. Mentation improved after some time in bair hugger and hypotension is responsive to IVF. CBC and CMP unremarkable aside from chronic anemia. No leucocytosis, lactic acid nrml, but UA is consistent with UTI. Blood cx x2 obtained, then administered ceftriaxone 1g x1 for concern of infection. EKG similar to previous with junctional rhythm, left ventricular hypertrophy with repolarization abnormality. CXR neg for active dz, NCHCT  neg for acute findings, right foot xray showed no evidence of osteomyelitis.  Brief Hospital Course: Home tamsulosin, Lasix and amlodipine were held were held given hypotension, hypothermia, and AMS. Patient was given fluid bolus and he received IV cefepime, Rocephin, and Zosyn (4 days on antimicobial). For urine cx growing pseudomonas (resistance to ciprofloxacin) and VRE.  Patient was discharged on fosfomycin x1 dose and amoxicillin x5 days.  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 CXR (-) and NCHCT (-) for acute processes. UTI treatment per below. Aspiration, delirium, fall precautions SLP/PT/OT eval Bair hugger Consider MoCA  Complicated UTI  neurogenic bladder with chronic indwelling Foley catheter Patient endorsed dysuria, with hypotension and hypothermia concerning for sepsis.  Urine culture grew Pseudomonas and VRE.  That are vancomycin and ciprofloxacin resistant.  Concern for possible prostatitis given patient's age and recurrent UTI.  He refused DRE during hospitalization, please consider prostatitis outpatient.  Patient will require close follow-up, with daily monitoring at home.  Patient lives with a lot of family, the main caretaker is his granddaughter [name]). 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 Sharol Given, has a follow-up appointment on 12/12.  Foot XR (-) for osteomyelitis.  CRP WNL and sed rate 18. Wound care consulted with daily dressing changes Follow up with outpatient orthopedic  Chronic anemia, stable Patient follows Dr. Lorenso Courier (heme-onc), who suspects its multifactorial from  vitamin B12 deficiency and chronic disease leading iron-deficiency.  Hb 8.3 on BMP, 7.5 on H/H.  At baseline Hb 8-9. Low reticulocyte 21.1, low iron 20, low saturation ratio 6%, low ferritin 15, (11/29). Per chart, patient is hesitant to start PO iron supplement due to constipation. Per family, patient receives regular IV iron infusions.  No transfusion required during hospitalization. Vitamin B12 1000 mcg daily Follow-up with PCP and heme-onc  CAD/CABG 2014  hx of CVA with residual left hemiplegia  PAD s/p right great toe amputation Plavix 75 mg Rosuvastatin 10 mg  HFrEF (EF 25-35%, Oct 2022) SSS s/p MDT ICD Junctional rhythm on admission EKG. Home Lasix 20 mg.  Patient has been euvolemic during hospitalization.  Strict I/Os and daily weights Held home Lasix 20 mg given hypotension per above  BPH BP has normalized and is currently stable. D/c'd home tamsulosin 0.4 mg daily but keep finasteride as patient has indwelling foley, that is likely to remain long-term given neurogenic bladder and failed trials in the past. Per urology, patient's tamsulosin is ok to be discontinued as patient will keep the foley long-term, but to keep the finasteride to keep prostate smaller for easier foley changes.  Finasteride 5 mg daily DC tamsulosin   HTN Amlodipine  10 mg daily at home, we will continue to hold in the setting of hypotension and hypothermia per above. Held home amlodipine per above  Other chronic conditions were medically managed with home medications and formulary alternatives as necessary   PCP Follow-up Recommendations: Complicated UTI, hx of ESBL and VRE. Urine cx grew Pseudomonas and VRE that were resistant to ciprofloxacin and vanc.  DC'd on fosfomycin x1 dose and amoxicillin x5 days. Consider prostate check to monitor for possible prostatitis. Patient declined during hospitalization. Follow-up with Dr. Lorenso Courier (heme-onc) for Chronic anemia and regular IV iron treatments Needs close  follow-up with urology for UTI and Foley Hypotensive on admission, held home amlodipine 10 mg daily and Lasix 20 mg, please reevaluate.  Patient was euvolemic during hospitalization. Follow-up with Dr. Sharol Given for amputation follow-up which was stable during hospitalization. Possible mild cognitive impairment, consider MoCA  Significant Procedures:  None   Significant Labs and Imaging:  Recent Labs  Lab 05/11/21 1145 05/11/21 1308 05/12/21 0320 05/12/21 0453 05/13/21 0102  WBC 4.8  --  5.1  --  4.8  HGB 8.6*   < > 7.5* 8.3* 8.3*  HCT 29.6*   < > 25.6* 27.9* 27.6*  PLT 185  --  133*  --  158   < > = values in this interval not displayed.   Recent Labs  Lab 05/11/21 1145 05/11/21 1308 05/11/21 1309 05/12/21 0320 05/13/21 0102  NA 140 142 142 137  --   K 3.4* 3.6 3.6 3.8  --   CL 106  --  105 107  --   CO2 28  --   --  24  --   GLUCOSE 88  --  124* 92  --   BUN 15  --  22 14  --   CREATININE 0.87  --  0.90 0.62  --   CALCIUM 10.1  --   --  9.0  --   MG  --   --   --   --  2.1  ALKPHOS 126  --   --   --   --   AST 14*  --   --   --   --   ALT 14  --   --   --   --   ALBUMIN 3.1*  --   --   --   --    No results found for this or any previous visit (from the past 24 hour(s)).  CT Head Wo Contrast  Result Date: 05/11/2021 CLINICAL DATA:  Mental status change of unknown cause. Weakness and hypotension EXAM: CT HEAD WITHOUT CONTRAST TECHNIQUE: Contiguous axial images were obtained from the base of the skull through the vertex without intravenous contrast. COMPARISON:  04/06/2021 FINDINGS: Brain: Age related volume loss. No focal abnormality seen affecting the brainstem or cerebellum. Chronic small-vessel ischemic changes affect the cerebral hemispheric white matter. Old small vessel infarction of the right thalamus. No sign of acute infarction, mass lesion, hemorrhage, hydrocephalus or extra-axial collection. Vascular: There is atherosclerotic calcification of the major vessels at  the base of the brain. Skull: Negative Sinuses/Orbits: Clear/normal Other: None IMPRESSION: No acute CT finding. Atrophy and chronic small-vessel ischemic change of the cerebral hemispheric white matter. Cerebral Atrophy (ICD10-G31.9). Electronically Signed   By: Nelson Chimes M.D.   On: 05/11/2021 12:57   DG Chest Portable 1 View  Result Date: 05/11/2021 CLINICAL DATA:  Hypotension.  Shortness of breath. EXAM: PORTABLE CHEST 1 VIEW COMPARISON:  03/22/2021 FINDINGS: The heart  size and mediastinal contours are within normal limits. Aortic atherosclerotic calcification noted. Prior CABG again noted. Dual lead transvenous pacemaker remains in appropriate position. Both lungs are clear. Skin fold again seen projecting over the right hemithorax. The visualized skeletal structures are unremarkable. IMPRESSION: No active disease. Electronically Signed   By: Marlaine Hind M.D.   On: 05/11/2021 12:40   DG Foot Complete Right  Result Date: 05/11/2021 CLINICAL DATA:  Possible sepsis.  Evaluate for osteomyelitis. 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 Calcaneal and Achilles spurs. No osseous destruction. No soft tissue gas. IMPRESSION: No evidence of acute osteomyelitis. Status post first phalanx and metatarsal amputation with similar hyperattenuation in the soft tissues of the surgical site. 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 This could be correlated with physical exam. Electronically Signed   By: Abigail Miyamoto M.D.   On: 05/11/2021 13:52   VAS [place] ABI WITH/WO TBI  Result Date: 04/28/2021  LOWER EXTREMITY DOPPLER STUDY Patient Name:  TUVIA WOODRICK  Date of Exam:   04/28/2021 Medical Rec #: [id number]           Accession #:    [id number] Date of Birth: 11/16/1940            Patient Gender: M Patient Age:   24 years Exam Location:  [facility] Procedure:      VAS [place] ABI WITH/WO TBI Referring Phys: Monica Martinez --------------------------------------------------------------------------------  Indications: Peripheral artery disease. High Risk Factors: Hypertension, current smoker, prior CVA.  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 Right common                         femoral to posterior tibial artery bypass graft                         03/26/2021. Performing Technologist: Alvia Grove RVT  Examination Guidelines: A complete evaluation includes at minimum, Doppler waveform signals and systolic blood pressure reading at the level of bilateral brachial, anterior tibial, and posterior tibial arteries, when vessel segments are accessible. Bilateral testing is considered an integral part of a complete examination. Photoelectric Plethysmograph (PPG) waveforms and toe systolic pressure readings are included as required and additional duplex testing as needed. Limited examinations for reoccurring indications may be performed as noted.  ABI Findings: +---------+------------------+-----+----------+---------+ Right    Rt Pressure (mmHg)IndexWaveform  Comment   +---------+------------------+-----+----------+---------+ Brachial 153                                        +---------+------------------+-----+----------+---------+ PTA      167               1.09 monophasic          +---------+------------------+-----+----------+---------+ DP       141               0.92 monophasic          +---------+------------------+-----+----------+---------+ Great Toe                                 amputated +---------+------------------+-----+----------+---------+ +---------+------------------+-----+----------+-------+  Left     Lt Pressure (mmHg)IndexWaveform  Comment +---------+------------------+-----+----------+-------+ Brachial  142                                      +---------+------------------+-----+----------+-------+ PTA      113               0.74 monophasic        +---------+------------------+-----+----------+-------+ DP       95                0.62 monophasic        +---------+------------------+-----+----------+-------+ Great Toe91                0.59 Abnormal          +---------+------------------+-----+----------+-------+ +-------+-----------+-----------+------------+------------+ ABI/TBIToday's ABIToday's TBIPrevious ABIPrevious TBI +-------+-----------+-----------+------------+------------+ Right  1.09       amputated  0.36        0.08         +-------+-----------+-----------+------------+------------+ Left   0.74       0.59       0.68        0.28         +-------+-----------+-----------+------------+------------+  Summary: Right: Resting right ankle-brachial index is within normal range, may be falsely elevated. Left: Resting left ankle-brachial index indicates moderate left lower extremity arterial disease. The left toe-brachial index is abnormal.  *See table(s) above for measurements and observations.  Electronically signed by Jamelle Haring on 04/28/2021 at 5:21:39 PM.    Final    XR Foot Complete Right  Result Date: 04/28/2021 Radiographs right foot show ulceration over medial border with probing. No probing to the second metatarsal head.  VAS [place] LOWER EXTREMITY BYPASS GRAFT DUPL  Result Date: 04/28/2021 LOWER EXTREMITY ARTERIAL DUPLEX STUDY Patient Name:  AZREAL STTHOMAS  Date of Exam:   04/28/2021 Medical Rec #: [id number]           Accession #:    [id number] Date of Birth: December 13, 1940           Patient Gender: M Patient Age:   54 years Exam Location:  [facility] Procedure:      VAS [place] LOWER EXTREMITY BYPASS GRAFT DUPLEX Referring Phys: Monica Martinez --------------------------------------------------------------------------------  Indications:  Peripheral artery disease. High Risk Factors: Hypertension, prior CVA.  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 Right common                         femoral to posterior tibial artery bypass graft                         03/26/2021. Current ABI:            Right ABI 1.09 Left 0.74 Performing Technologist: Alvia Grove RVT  Examination Guidelines: A complete evaluation includes B-mode imaging, spectral Doppler, color Doppler, and power Doppler as needed of all accessible portions of each vessel. Bilateral testing is considered an integral part of a complete examination. Limited examinations for reoccurring indications may be performed as noted.   Right Graft #1: +------------------+--------+---------------+----------+--------+                   PSV cm/sStenosis       Waveform  Comments +------------------+--------+---------------+----------+--------+ Inflow            84  triphasic          +------------------+--------+---------------+----------+--------+ Prox Anastomosis  123                    biphasic           +------------------+--------+---------------+----------+--------+ Proximal Graft    86                     monophasic         +------------------+--------+---------------+----------+--------+ Mid Graft         54                     monophasic         +------------------+--------+---------------+----------+--------+ Distal Graft      62                     monophasic         +------------------+--------+---------------+----------+--------+ Distal Anastomosis69                     monophasic         +------------------+--------+---------------+----------+--------+ Outflow           273     50-74% stenosismonophasic         +------------------+--------+---------------+----------+--------+   Summary: Right: Patent right femoral to posterior tibial artery bypass graft with increased velocity distal to graft in the 50 - 74%  stenosis.  See table(s) above for measurements and observations. Electronically signed by Jamelle Haring on 04/28/2021 at 5:22:32 PM.    Final      Results/Tests Pending at Time of Discharge:  Unresulted Labs (From admission, onward)     Start     Ordered   05/18/21 0500  Creatinine, serum  (enoxaparin (LOVENOX)    CrCl >/= 30 ml/min)  Weekly,   R     Comments: while on enoxaparin therapy    05/11/21 1532   05/15/21 3903  Basic metabolic panel  Daily,   R     Question:  Specimen collection method  Answer:  Lab=Lab collect   05/14/21 0935   05/15/21 0800  CBC  Daily,   R     Question:  Specimen collection method  Answer:  Lab=Lab collect   05/14/21 0935             Discharge Medications:  Allergies as of 05/14/2021       Reactions   Food Anaphylaxis, Rash   Clams   Aspirin Itching   [employer] (m. Mercenaria) Skin Test Itching        Medication List     STOP taking these medications    amLODipine 10 MG tablet Commonly known as: NORVASC   bisacodyl 5 MG EC tablet Commonly known as: DULCOLAX   citalopram 20 MG tablet Commonly known as: CELEXA   docusate sodium 100 MG capsule Commonly known as: COLACE   doxycycline 100 MG tablet Commonly known as: VIBRA-TABS   furosemide 20 MG tablet Commonly known as: LASIX   hydrALAZINE 50 MG tablet Commonly known as: APRESOLINE   isosorbide mononitrate 60 MG 24 hr tablet Commonly known as: IMDUR   tamsulosin 0.4 MG Caps capsule Commonly known as: FLOMAX       TAKE these medications    acetaminophen 325 MG tablet Commonly known as: TYLENOL Take 650 mg by mouth every 4 (four) hours as needed for mild pain.   amoxicillin 500 MG capsule Commonly known as: AMOXIL Take 1 capsule (  500 mg total) by mouth 3 (three) times daily for 5 days.   clopidogrel 75 MG tablet Commonly known as: PLAVIX Take 75 mg by mouth at bedtime.   divalproex 125 MG DR tablet Commonly known as: DEPAKOTE Take 125 mg by mouth  at bedtime.   finasteride 5 MG tablet Commonly known as: PROSCAR Take 5 mg by mouth every morning.   HYDROcodone-acetaminophen 5-325 MG tablet Commonly known as: NORCO/VICODIN Take 1-2 tablets by mouth every 4 (four) hours as needed for moderate pain (pain score 4-6).   nitroGLYCERIN 0.4 MG SL tablet Commonly known as: NITROSTAT Place 0.4 mg under the tongue every 5 (five) minutes as needed for chest pain. What changed: Another medication with the same name was removed. Continue taking this medication, and follow the directions you see here.   pantoprazole 40 MG tablet Commonly known as: PROTONIX Take 1 tablet (40 mg total) by mouth daily.   polyethylene glycol 17 g packet Commonly known as: MIRALAX / GLYCOLAX Take 17 g by mouth daily as needed for mild constipation.   rosuvastatin 10 MG tablet Commonly known as: CRESTOR Take 10 mg by mouth every morning.   vitamin B-12 1000 MCG tablet Commonly known as: CYANOCOBALAMIN Take 1 tablet (1,000 mcg total) by mouth daily.   Vitamin D (Ergocalciferol) 1.25 MG (50000 UNIT) Caps capsule Commonly known as: DRISDOL Take 50,000 Units by mouth every Monday.               Discharge Care Instructions  (From admission, onward)           Start     Ordered   05/14/21 0000  Discharge wound care:       Comments: Daily dressing changes as instructed by your orthopedic doctor.   05/14/21 1515            Discharge Instructions:  Please refer to Patient Instructions section of EMR for full details.  Patient was counseled important signs and symptoms that should prompt return to medical care, changes in medications, dietary instructions, activity restrictions, and follow up appointments.   Follow-Up Appointments:  Follow-up Information     Fredrich Romans, [place]. Go in 1 week(s).   Specialty: Physician Assistant Why: For a hospital follow-up Contact information: [address] [address] [phone]          Jettie Booze, MD .   Specialties: Cardiology, Radiology, Interventional Cardiology Contact information: [address] [place] [address] [phone]         Newt Minion, MD. Go to.   Specialty: Orthopedic Surgery Why: This is just a reminder that you already have an appointment with Dr. Donna Christen information: [place] [phone]         Lincoln Brigham, PA-C Follow up.   Specialty: Hematology and Oncology Contact information: [place] [address] [facility]. Go in 1 week(s).   Why: For hospital follow-up                 Merrily Brittle, DO 05/14/2021, 3:16 PM PGY-1, [place]

## 2021-05-16 LAB — CULTURE, BLOOD (ROUTINE X 2): Culture: NO GROWTH

## 2021-05-18 ENCOUNTER — Ambulatory Visit: Payer: Medicare Other | Admitting: Orthopedic Surgery

## 2021-05-28 ENCOUNTER — Emergency Department (HOSPITAL_COMMUNITY)
Admission: EM | Admit: 2021-05-28 | Discharge: 2021-05-28 | Disposition: A | Discharge status: Home / Self Care | Payer: Medicare Other | Attending: Emergency Medicine | Admitting: Emergency Medicine

## 2021-05-28 ENCOUNTER — Emergency Department (HOSPITAL_COMMUNITY): Payer: Medicare Other

## 2021-05-28 ENCOUNTER — Ambulatory Visit (INDEPENDENT_AMBULATORY_CARE_PROVIDER_SITE_OTHER): Payer: Medicare Other

## 2021-05-28 ENCOUNTER — Encounter (HOSPITAL_COMMUNITY): Payer: Self-pay

## 2021-05-28 ENCOUNTER — Other Ambulatory Visit: Payer: Self-pay

## 2021-05-28 VITALS — BP 157/73 | HR 77 | Temp 97.1°F | Resp 20 | Ht 74.0 in | Wt 135.0 lb

## 2021-05-28 DIAGNOSIS — I5022 Chronic systolic (congestive) heart failure: Secondary | ICD-10-CM | POA: Diagnosis not present

## 2021-05-28 DIAGNOSIS — R39198 Other difficulties with micturition: Secondary | ICD-10-CM | POA: Diagnosis not present

## 2021-05-28 DIAGNOSIS — R0789 Other chest pain: Secondary | ICD-10-CM | POA: Diagnosis present

## 2021-05-28 DIAGNOSIS — R079 Chest pain, unspecified: Secondary | ICD-10-CM

## 2021-05-28 DIAGNOSIS — I110 Hypertensive heart disease with heart failure: Secondary | ICD-10-CM | POA: Insufficient documentation

## 2021-05-28 DIAGNOSIS — D509 Iron deficiency anemia, unspecified: Secondary | ICD-10-CM

## 2021-05-28 DIAGNOSIS — R0602 Shortness of breath: Secondary | ICD-10-CM | POA: Insufficient documentation

## 2021-05-28 DIAGNOSIS — Z87891 Personal history of nicotine dependence: Secondary | ICD-10-CM | POA: Insufficient documentation

## 2021-05-28 DIAGNOSIS — I2510 Atherosclerotic heart disease of native coronary artery without angina pectoris: Secondary | ICD-10-CM | POA: Diagnosis not present

## 2021-05-28 DIAGNOSIS — D508 Other iron deficiency anemias: Secondary | ICD-10-CM | POA: Diagnosis not present

## 2021-05-28 DIAGNOSIS — Z7902 Long term (current) use of antithrombotics/antiplatelets: Secondary | ICD-10-CM | POA: Insufficient documentation

## 2021-05-28 LAB — CBC WITH DIFFERENTIAL/PLATELET
Abs Immature Granulocytes: 0.01 10*3/uL (ref 0.00–0.07)
Basophils Absolute: 0 10*3/uL (ref 0.0–0.1)
Basophils Relative: 1 %
Eosinophils Absolute: 0.1 10*3/uL (ref 0.0–0.5)
Eosinophils Relative: 1 %
HCT: 30.4 % — ABNORMAL LOW (ref 39.0–52.0)
Hemoglobin: 8.7 g/dL — ABNORMAL LOW (ref 13.0–17.0)
Immature Granulocytes: 0 %
Lymphocytes Relative: 32 %
Lymphs Abs: 1.3 10*3/uL (ref 0.7–4.0)
MCH: 22.7 pg — ABNORMAL LOW (ref 26.0–34.0)
MCHC: 28.6 g/dL — ABNORMAL LOW (ref 30.0–36.0)
MCV: 79.2 fL — ABNORMAL LOW (ref 80.0–100.0)
Monocytes Absolute: 0.4 10*3/uL (ref 0.1–1.0)
Monocytes Relative: 9 %
Neutro Abs: 2.3 10*3/uL (ref 1.7–7.7)
Neutrophils Relative %: 57 %
Platelets: 181 10*3/uL (ref 150–400)
RBC: 3.84 MIL/uL — ABNORMAL LOW (ref 4.22–5.81)
RDW: 17.5 % — ABNORMAL HIGH (ref 11.5–15.5)
WBC: 4.1 10*3/uL (ref 4.0–10.5)
nRBC: 0 % (ref 0.0–0.2)

## 2021-05-28 LAB — COMPREHENSIVE METABOLIC PANEL
ALT: 12 U/L (ref 0–44)
AST: 15 U/L (ref 15–41)
Albumin: 3 g/dL — ABNORMAL LOW (ref 3.5–5.0)
Alkaline Phosphatase: 125 U/L (ref 38–126)
Anion gap: 5 (ref 5–15)
BUN: 14 mg/dL (ref 8–23)
CO2: 27 mmol/L (ref 22–32)
Calcium: 9.6 mg/dL (ref 8.9–10.3)
Chloride: 107 mmol/L (ref 98–111)
Creatinine, Ser: 0.81 mg/dL (ref 0.61–1.24)
GFR, Estimated: >60 mL/min (ref >60)
Glucose, Bld: 109 mg/dL — ABNORMAL HIGH (ref 70–99)
Potassium: 3.7 mmol/L (ref 3.5–5.1)
Sodium: 139 mmol/L (ref 135–145)
Total Bilirubin: 0.9 mg/dL (ref 0.3–1.2)
Total Protein: 6.5 g/dL (ref 6.5–8.1)

## 2021-05-28 LAB — URINALYSIS, ROUTINE W REFLEX MICROSCOPIC
Bilirubin Urine: NEGATIVE
Glucose, UA: NEGATIVE mg/dL
Ketones, ur: NEGATIVE mg/dL
Nitrite: NEGATIVE
Protein, ur: NEGATIVE mg/dL
Specific Gravity, Urine: 1.006 (ref 1.005–1.030)
WBC, UA: >50 WBC/hpf — ABNORMAL HIGH (ref 0–5)
pH: 8 (ref 5.0–8.0)

## 2021-05-28 LAB — TROPONIN I (HIGH SENSITIVITY)
Troponin I (High Sensitivity): 17 ng/L (ref <18)
Troponin I (High Sensitivity): 17 ng/L (ref <18)

## 2021-05-28 MED ORDER — ALTEPLASE 2 MG IJ SOLR: 2.0000 mg | Freq: Once | INTRAMUSCULAR | Status: DC | PRN

## 2021-05-28 MED ORDER — SODIUM CHLORIDE 0.9% FLUSH: 10.0000 mL | Freq: Once | INTRAVENOUS | Status: DC | PRN

## 2021-05-28 MED ORDER — DIPHENHYDRAMINE HCL 50 MG/ML IJ SOLN: 50.0000 mg | Freq: Once | INTRAMUSCULAR | Status: DC | PRN

## 2021-05-28 MED ORDER — METHYLPREDNISOLONE SODIUM SUCC 125 MG IJ SOLR: 125.0000 mg | Freq: Once | INTRAMUSCULAR | Status: DC | PRN

## 2021-05-28 MED ORDER — SODIUM CHLORIDE 0.9 % IV SOLN: Freq: Once | INTRAVENOUS | Status: DC

## 2021-05-28 MED ORDER — HEPARIN SOD (PORK) LOCK FLUSH 100 UNIT/ML IV SOLN: 250.0000 [IU] | Freq: Once | INTRAVENOUS | Status: DC | PRN

## 2021-05-28 MED ORDER — FAMOTIDINE IN NACL 20-0.9 MG/50ML-% IV SOLN: 20.0000 mg | Freq: Once | INTRAVENOUS | Status: DC | PRN

## 2021-05-28 MED ORDER — SODIUM CHLORIDE 0.9 % IV SOLN
1000.0000 mg | Freq: Once | INTRAVENOUS | Status: AC
  Administered 2021-05-28: 11:00:00 1000 mg via INTRAVENOUS
  Filled 2021-05-28: qty 10

## 2021-05-28 MED ORDER — HEPARIN SOD (PORK) LOCK FLUSH 100 UNIT/ML IV SOLN: 500.0000 [IU] | Freq: Once | INTRAVENOUS | Status: DC | PRN

## 2021-05-28 MED ORDER — EPINEPHRINE 0.3 MG/0.3ML IJ SOAJ: 0.3000 mg | Freq: Once | INTRAMUSCULAR | Status: DC | PRN

## 2021-05-28 MED ORDER — SODIUM CHLORIDE 0.9% FLUSH: 3.0000 mL | Freq: Once | INTRAVENOUS | Status: DC | PRN

## 2021-05-28 MED ORDER — SODIUM CHLORIDE 0.9 % IV SOLN: Freq: Once | INTRAVENOUS | Status: DC | PRN

## 2021-05-28 NOTE — Progress Notes (Signed)
Diagnosis: Iron Deficiency Anemia  Provider:  Marshell Garfinkel, MD  Procedure: Infusion  IV Type: Peripheral, IV Location: L Antecubital  Monoferric, Dose: 1000mg   Infusion Start Time: 1034  Infusion Stop Time: 1120  Post Infusion IV Care: Observation period completed and Peripheral IV Discontinued  Discharge: Condition: Good, Destination: Home . AVS provided to patient.   Performed by:  Koren Shiver, RN

## 2021-05-28 NOTE — Discharge Instructions (Signed)
Please follow-up both with your cardiologist as well as your primary care doctor.  If you have recurrent chest pain or any difficulty breathing, please return to the emergency room for reassessment.

## 2021-05-28 NOTE — ED Notes (Signed)
Patient is resting comfortably. POC discussed wih pt.

## 2021-05-28 NOTE — ED Triage Notes (Signed)
Pt reports central/L sided chest pain onset yesterday. Has associated shortness of breath.

## 2021-05-28 NOTE — ED Provider Notes (Signed)
Emergency Medicine Provider Triage Evaluation Note  Todd Dunn , a 80 y.o. male  was evaluated in triage.  Pt complains of chest pain.  Indicates pain to the left side of his chest.  States that pain started yesterday.  Patient also endorses associated shortness of breath.  Patient also complains of problem with urination.  Patient found to be alert to person and place.  Patient unable to give further details on his current illness.  Review of Systems  Positive: Chest pain, shortness of breath, Negative:   Physical Exam  BP (!) 141/72 (BP Location: Right Arm)    Pulse 81    Temp 97.8 F (36.6 C) (Oral)    Resp 18    SpO2 98%  Gen:   Awake, no distress   Resp:  Normal effort, lungs clear to auscultation bilaterally MSK:   Moves extremities without difficulty, no swelling or tenderness to bilateral lower extremities Other:  +2 radial pulse bilaterally  Medical Decision Making  Medically screening exam initiated at 4:59 PM.  Appropriate orders placed.  Todd Dunn was informed that the remainder of the evaluation will be completed by another provider, this initial triage assessment does not replace that evaluation, and the importance of remaining in the ED until their evaluation is complete.  Will initiate ACS work-up due to reports of chest pain.  Additionally will obtain urinalysis due to reports of problem with urination   Dyann Ruddle 05/28/21 [place]    Teressa Lower, MD 05/28/21 [phone]

## 2021-05-28 NOTE — ED Provider Notes (Signed)
[facility] EMERGENCY DEPARTMENT Provider Note   CSN: [id number] Arrival date & time: 05/28/21  1636     History Chief Complaint  Patient presents with   Chest Pain    Todd Dunn is a 80 y.o. male. Presenting to ER for chest pain. Patient reports pain started yesterday, has been intermittent, not associated with exertion, occurring at rest.  No alleviating or aggravating factors.  Comes for a few minutes at a time.  Central to left-sided, nonradiating.  Did have some associated shortness of breath.  Currently does not have chest pain or difficulty in breathing.  Reviewed discharge summary, recently admitted for complicated UTI, sepsis and hypotension and altered mental status.  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  HPI     Past Medical History:  Diagnosis Date   BPH (benign prostatic hyperplasia)    CAD (coronary artery disease)    Chronic indwelling Foley catheter    CVA (cerebral vascular accident) ([place])    HTN (hypertension)    Neurogenic bladder    SSS (sick sinus syndrome) ([place])    s/p MDT ICD   Vitamin D deficiency     Patient Active Problem List   Diagnosis Date Noted   AMS (altered mental status) 05/11/2021   Hypotension 05/11/2021   Iron deficiency anemia 05/07/2021   Oral phase dysphagia 04/28/2021   Protein-calorie malnutrition, severe 03/27/2021   Toe infection    PVD (peripheral vascular disease) ([place])    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 coli infection 12/15/2020   BPH (benign prostatic hyperplasia) 28/36/6294   Chronic systolic heart failure ([place]) 12/12/2019    Past Surgical History:  Procedure Laterality Date   ABDOMINAL AORTOGRAM W/LOWER EXTREMITY N/A 03/23/2021   Procedure: ABDOMINAL AORTOGRAM W/LOWER EXTREMITY;  Surgeon: Waynetta Sandy, MD;  Location: [facility];  Service: Cardiovascular;  Laterality: N/A;   AMPUTATION Right 04/01/2021   Procedure: RIGHT GREAT TOE AMPUTATION;  Surgeon: Newt Minion, MD;  Location: [facility];  Service: Orthopedics;  Laterality: Right;   APPENDECTOMY     APPLICATION OF WOUND VAC Right 04/01/2021   Procedure: APPLICATION OF WOUND VAC;  Surgeon: Newt Minion, MD;  Location: [facility];  Service: Orthopedics;  Laterality: Right;   CARDIAC SURGERY     FEMORAL-TIBIAL BYPASS GRAFT Right 03/26/2021   Procedure: BYPASS GRAFT FEMORAL-TIBIAL ARTERY.  Harvest right leg saphenous vein, right vein patch angioplasty posterior tibial.;  Surgeon: Marty Heck, MD;  Location: [facility];  Service: Vascular;  Laterality: Right;   PACEMAKER PLACEMENT         Family History  Family history unknown: Yes    Social History   Tobacco Use   Smoking status: Former    Types: Cigarettes   Smokeless tobacco: Never  Substance Use Topics   Alcohol use: Not Currently   Drug use: Never    Home Medications Prior to Admission medications   Medication Sig Start Date End Date Taking? Authorizing Provider  acetaminophen (  TYLENOL) 325 MG tablet Take 650 mg by mouth every 4 (four) hours as needed for mild pain.   Yes [provider]  clopidogrel (PLAVIX) 75 MG tablet Take 75 mg by mouth at bedtime.   Yes [provider]  divalproex (DEPAKOTE) 125 MG DR tablet Take 125 mg by mouth at bedtime.   Yes [provider]  finasteride (PROSCAR) 5 MG tablet Take 5 mg by mouth every morning.   Yes  [provider]  HYDROcodone-acetaminophen (NORCO/VICODIN) 5-325 MG tablet Take 1-2 tablets by mouth every 4 (four) hours as needed for moderate pain (pain score 4-6). 04/05/21  Yes Shawna Clamp, MD  nitroGLYCERIN (NITROSTAT) 0.4 MG SL tablet Place 0.4 mg under the tongue every 5 (five) minutes as needed for chest pain.   Yes [provider]  pantoprazole (PROTONIX) 40 MG tablet Take 1 tablet (40 mg total) by mouth daily. 04/06/21  Yes Shawna Clamp, MD  polyethylene glycol (MIRALAX / GLYCOLAX) 17 g packet Take 17 g by mouth daily as needed for mild constipation. 05/14/21  Yes Merrily Brittle, DO  rosuvastatin (CRESTOR) 10 MG tablet Take 10 mg by mouth every morning.   Yes [provider]  vitamin B-12 (CYANOCOBALAMIN) 1000 MCG tablet Take 1 tablet (1,000 mcg total) by mouth daily. 01/08/21  Yes Dede Query T, PA-C  Vitamin D, Ergocalciferol, (DRISDOL) 1.25 MG (50000 UNIT) CAPS capsule Take 50,000 Units by mouth every Monday.   Yes [provider]    Allergies    Food, Aspirin, and Northern quahog clam (m. mercenaria) skin test  Review of Systems   Review of Systems  Constitutional:  Negative for chills and fever.  HENT:  Negative for ear pain and sore throat.   Eyes:  Negative for pain and visual disturbance.  Respiratory:  Positive for chest tightness and shortness of breath. Negative for cough.   Cardiovascular:  Positive for chest pain. Negative for palpitations.  Gastrointestinal:  Negative for abdominal pain and vomiting.  Genitourinary:  Negative for dysuria and hematuria.  Musculoskeletal:  Negative for arthralgias and back pain.  Skin:  Negative for color change and rash.  Neurological:  Negative for seizures and syncope.  All other systems reviewed and are negative.  Physical Exam Updated Vital Signs BP (!) 156/95    Pulse 80    Temp 97.8 F (36.6 C) (Oral)    Resp 14    SpO2 100%   Physical Exam Vitals and nursing note reviewed.   Constitutional:      Comments: Cachectic, no acute distress  HENT:     Head: Normocephalic and atraumatic.  Eyes:     Conjunctiva/sclera: Conjunctivae normal.  Cardiovascular:     Rate and Rhythm: Normal rate and regular rhythm.     Heart sounds: No murmur heard. Pulmonary:     Effort: Pulmonary effort is normal. No respiratory distress.     Breath sounds: Normal breath sounds.  Abdominal:     Palpations: Abdomen is soft.     Tenderness: There is no abdominal tenderness.  Musculoskeletal:        General: No swelling.     Cervical back: Neck supple.     Right lower leg: No edema.     Left lower leg: No edema.  Skin:    General: Skin is warm and dry.     Capillary Refill: Capillary refill takes less than 2 seconds.  Neurological:     Mental Status: He is alert.     Comments:  Alert, dysarthria present  Psychiatric:        Mood and Affect: Mood normal.    ED Results / Procedures / Treatments   Labs (all labs ordered are listed, but only abnormal results are displayed) Labs Reviewed  URINALYSIS, ROUTINE W REFLEX MICROSCOPIC - Abnormal; Notable for the following components:      Result Value   Color, Urine STRAW (*)    APPearance HAZY (*)    Hgb urine dipstick MODERATE (*)    Leukocytes,Ua LARGE (*)    WBC, UA >50 (*)    Bacteria, UA RARE (*)    All other components within normal limits  COMPREHENSIVE METABOLIC PANEL - Abnormal; Notable for the following components:   Glucose, Bld 109 (*)    Albumin 3.0 (*)    All other components within normal limits  CBC WITH DIFFERENTIAL/PLATELET - Abnormal; Notable for the following components:   RBC 3.84 (*)    Hemoglobin 8.7 (*)    HCT 30.4 (*)    MCV 79.2 (*)    MCH 22.7 (*)    MCHC 28.6 (*)    RDW 17.5 (*)    All other components within normal limits  URINE CULTURE  TROPONIN I (HIGH SENSITIVITY)  TROPONIN I (HIGH SENSITIVITY)    EKG EKG Interpretation  Date/Time:  Thursday May 28 2021 16:45:50 EST Ventricular  Rate:  83 PR Interval:  122 QRS Duration: 110 QT Interval:  414 QTC Calculation: 486 R Axis:   -1 Text Interpretation: Sinus rhythm with sinus arrhythmia with frequent and consecutive Premature ventricular complexes Left ventricular hypertrophy with repolarization abnormality ( Sokolow-Lyon , Cornell product ) Prolonged QT Abnormal ECG Confirmed by Madalyn Rob [phone]) on 05/28/2021 6:45:23 PM  Radiology DG Chest 2 View  Result Date: 05/28/2021 CLINICAL DATA:  Left-sided chest pain for 2 days with shortness of breath, initial encounter EXAM: CHEST - 2 VIEW COMPARISON:  05/11/2021 FINDINGS: Cardiac shadow is within normal limits. Postsurgical changes are noted. Defibrillator is again seen. Lungs are well aerated bilaterally. No focal infiltrate or sizable effusion is noted. Degenerative changes of the thoracic spine are seen. IMPRESSION: No active cardiopulmonary disease. Electronically Signed   By: Inez Catalina M.D.   On: 05/28/2021 17:37    Procedures Procedures   Medications Ordered in ED Medications - No data to display  ED Course  I have reviewed the triage vital signs and the nursing notes.  Pertinent labs & imaging results that were available during my care of the patient were reviewed by me and considered in my medical decision making (see chart for details).    MDM Rules/Calculators/A&P                          80 year old gentleman presenting to the ER with concern for chest pain.  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  Recently admitted for sepsis.  On exam patient appears well, seems to be answering questions appropriately and able to provide history.  Additional history obtained from family.  EKG does not have any acute ischemic change and troponin x2 is within normal limits.  Throughout ER stay he denied any ongoing chest pain.  Given this work-up, I  have very low suspicion for ACS.  His chest x-ray was negative.  Basic labs were stable.  UA with WBCs and leukocytes but negative nitrites and only rare bacteria.  Patient does not have leukocytosis fever and family or patient did not endorse any specific symptoms to suggest UTI.  Suspect patient likely is colonized given his indwelling catheter.  Will send urine culture to see if there is any clinically significant bacterial growth.  Recommend patient follow-up both with cardiology as well as with his primary care doctor, reviewed return precautions with both patient and family.     After the discussed management above, the patient was determined to be safe for discharge.  The patient was in agreement with this plan and all questions regarding their care were answered.  ED return precautions were discussed and the patient will return to the ED with any significant worsening of condition.    4   Final Clinical Impression(s) / ED Diagnoses Final diagnoses:  Chest pain, unspecified type    Rx / DC Orders ED Discharge Orders     None        Lucrezia Starch, MD 05/29/21 1505

## 2021-05-31 LAB — URINE CULTURE: Culture: >=100000 — AB

## 2021-06-01 ENCOUNTER — Telehealth: Payer: Self-pay | Admitting: Emergency Medicine

## 2021-06-01 NOTE — Telephone Encounter (Signed)
Post ED Visit - Positive Culture Follow-up: Successful Patient Follow-Up  Culture assessed and recommendations reviewed by:  []  Elenor Quinones, Pharm.D. []  Heide Guile, Pharm.D., BCPS AQ-ID []  Parks Neptune, Pharm.D., BCPS []  Alycia Rossetti, Pharm.D., BCPS []  Jensen Beach, Pharm.D., BCPS, AAHIVP []  Legrand Como, Pharm.D., BCPS, AAHIVP []  Salome Arnt, PharmD, BCPS []  Johnnette Gourd, PharmD, BCPS []  Hughes Better, PharmD, BCPS []  Leeroy Cha, PharmD Gorden Harms PharmD  Positive urine culture  []  Patient discharged without antimicrobial prescription and treatment is now indicated []  Organism is resistant to prescribed ED discharge antimicrobial []  Patient with positive blood cultures  Changes discussed with ED provider: Dr Deno Etienne New antibiotic prescription symptom check, if having urinary symptoms can order fosfomycin 3grams po once  Attempting to contact patient   Hazle Nordmann 06/01/2021, 3:33 PM

## 2021-06-01 NOTE — Progress Notes (Signed)
ED Antimicrobial Stewardship Positive Culture Follow Up   Todd Dunn is an 80 y.o. male who presented to [facility] on 05/28/2021 with a chief complaint of  Chief Complaint  Patient presents with   Chest Pain    Recent Results (from the past 720 hour(s))  Blood Cultures (routine x 2)     Status: None   Collection Time: 05/11/21 11:45 AM   Specimen: BLOOD RIGHT ARM  Result Value Ref Range Status   Specimen Description BLOOD RIGHT ARM  Final   Special Requests   Final    BOTTLES DRAWN AEROBIC AND ANAEROBIC Blood Culture results may not be optimal due to an inadequate volume of blood received in culture bottles   Culture   Final    NO GROWTH 5 DAYS Performed at [facility], [place] [address], [place], [place] [address]    Report Status 05/16/2021 FINAL  Final  Blood Cultures (routine x 2)     Status: Abnormal   Collection Time: 05/11/21 12:25 PM   Specimen: BLOOD  Result Value Ref Range Status   Specimen Description BLOOD LEFT ANTECUBITAL  Final   Special Requests   Final    BOTTLES DRAWN AEROBIC AND ANAEROBIC Blood Culture adequate volume   Culture  Setup Time   Final    GRAM POSITIVE COCCI AEROBIC BOTTLE ONLY CRITICAL RESULT CALLED TO, READ BACK BY AND VERIFIED WITH: PHARMD EDEN BREWINGTON 05/12/2021 @2024  BY JW    Culture (A)  Final    STAPHYLOCOCCUS CAPITIS THE SIGNIFICANCE OF ISOLATING THIS ORGANISM FROM A SINGLE SET OF BLOOD CULTURES WHEN MULTIPLE SETS ARE DRAWN IS UNCERTAIN. PLEASE NOTIFY THE MICROBIOLOGY DEPARTMENT WITHIN ONE WEEK IF SPECIATION AND SENSITIVITIES ARE REQUIRED. Performed at [facility], [place] [address], [place], [place] [address]    Report Status 05/14/2021 FINAL  Final  Blood Culture ID Panel (Reflexed)     Status: Abnormal   Collection Time: 05/11/21 12:25 PM  Result Value Ref Range Status   Enterococcus faecalis NOT DETECTED NOT DETECTED Final   Enterococcus Faecium NOT DETECTED NOT DETECTED Final   Listeria monocytogenes NOT  DETECTED NOT DETECTED Final   Staphylococcus species DETECTED (A) NOT DETECTED Final    Comment: CRITICAL RESULT CALLED TO, READ BACK BY AND VERIFIED WITH: PHARMD EDEN BREWINGTON 05/12/2021 @2024  BY JW    Staphylococcus aureus (BCID) NOT DETECTED NOT DETECTED Final   Staphylococcus epidermidis NOT DETECTED NOT DETECTED Final   Staphylococcus lugdunensis NOT DETECTED NOT DETECTED Final   Streptococcus species NOT DETECTED NOT DETECTED Final   Streptococcus agalactiae NOT DETECTED NOT DETECTED Final   Streptococcus pneumoniae NOT DETECTED NOT DETECTED Final   Streptococcus pyogenes NOT DETECTED NOT DETECTED Final   A.calcoaceticus-baumannii NOT DETECTED NOT DETECTED Final   Bacteroides fragilis NOT DETECTED NOT DETECTED Final   Enterobacterales NOT DETECTED NOT DETECTED Final   Enterobacter cloacae complex NOT DETECTED NOT DETECTED Final   Escherichia coli NOT DETECTED NOT DETECTED Final   Klebsiella aerogenes NOT DETECTED NOT DETECTED Final   Klebsiella oxytoca NOT DETECTED NOT DETECTED Final   Klebsiella pneumoniae NOT DETECTED NOT DETECTED Final   Proteus species NOT DETECTED NOT DETECTED Final   Salmonella species NOT DETECTED NOT DETECTED Final   Serratia marcescens NOT DETECTED NOT DETECTED Final   Haemophilus influenzae NOT DETECTED NOT DETECTED Final   Neisseria meningitidis NOT DETECTED NOT DETECTED Final   Pseudomonas aeruginosa NOT DETECTED NOT DETECTED Final   Stenotrophomonas maltophilia NOT DETECTED NOT DETECTED Final  Candida albicans NOT DETECTED NOT DETECTED Final   Candida auris NOT DETECTED NOT DETECTED Final   Candida glabrata NOT DETECTED NOT DETECTED Final   Candida krusei NOT DETECTED NOT DETECTED Final   Candida parapsilosis NOT DETECTED NOT DETECTED Final   Candida tropicalis NOT DETECTED NOT DETECTED Final   Cryptococcus neoformans/gattii NOT DETECTED NOT DETECTED Final    Comment: Performed at [facility], [place] [address] [place], [place] [address]   Resp Panel by RT-PCR (Flu A&B, Covid) Nasopharyngeal Swab     Status: None   Collection Time: 05/11/21  1:00 PM   Specimen: Nasopharyngeal Swab; Nasopharyngeal(NP) swabs in vial transport medium  Result Value Ref Range Status   SARS Coronavirus 2 by RT PCR NEGATIVE NEGATIVE Final    Comment: (NOTE) SARS-CoV-2 target nucleic acids are NOT DETECTED.  The SARS-CoV-2 RNA is generally detectable in upper respiratory specimens during the acute phase of infection. The lowest concentration of SARS-CoV-2 viral copies this assay can detect is 138 copies/mL. A negative result does not preclude SARS-Cov-2 infection and should not be used as the sole basis for treatment or other patient management decisions. A negative result may occur with  improper specimen collection/handling, submission of specimen other than nasopharyngeal swab, presence of viral mutation(s) within the areas targeted by this assay, and inadequate number of viral copies(<138 copies/mL). A negative result must be combined with clinical observations, patient history, and epidemiological information. The expected result is Negative.  Fact Sheet for Patients:  EntrepreneurPulse.com.au  Fact Sheet for Healthcare Providers:  IncredibleEmployment.be  This test is no t yet approved or cleared by the [place] FDA and  has been authorized for detection and/or diagnosis of SARS-CoV-2 by FDA under an Emergency Use Authorization (EUA). This EUA will remain  in effect (meaning this test can be used) for the duration of the COVID-19 declaration under Section 564(b)(1) of the Act, 21 U.S.C.section 360bbb-3(b)(1), unless the authorization is terminated  or revoked sooner.       Influenza A by PCR NEGATIVE NEGATIVE Final   Influenza B by PCR NEGATIVE NEGATIVE Final    Comment: (NOTE) The Xpert Xpress SARS-CoV-2/FLU/RSV plus assay is intended as an aid in the diagnosis of influenza from  Nasopharyngeal swab specimens and should not be used as a sole basis for treatment. Nasal washings and aspirates are unacceptable for Xpert Xpress SARS-CoV-2/FLU/RSV testing.  Fact Sheet for Patients: EntrepreneurPulse.com.au  Fact Sheet for Healthcare Providers: IncredibleEmployment.be  This test is not yet approved or cleared by the [place] FDA and has been authorized for detection and/or diagnosis of SARS-CoV-2 by FDA under an Emergency Use Authorization (EUA). This EUA will remain in effect (meaning this test can be used) for the duration of the COVID-19 declaration under Section 564(b)(1) of the Act, 21 U.S.C. section 360bbb-3(b)(1), unless the authorization is terminated or revoked.  Performed at [facility], [place] [address] [place], [place] [address]   Urine Culture     Status: Abnormal   Collection Time: 05/11/21  2:28 PM   Specimen: Urine, Clean Catch  Result Value Ref Range Status   Specimen Description URINE, CLEAN CATCH  Final   Special Requests   Final    NONE Performed at [facility], [place] [address], [place],  [address]    Culture (A)  Final    >=100,000 COLONIES/mL PSEUDOMONAS AERUGINOSA >=100,000 COLONIES/mL ENTEROCOCCUS FAECALIS VANCOMYCIN RESISTANT ENTEROCOCCUS ISOLATED    Report Status 05/14/2021 FINAL  Final  Organism ID, Bacteria PSEUDOMONAS AERUGINOSA (A)  Final   Organism ID, Bacteria ENTEROCOCCUS FAECALIS (A)  Final      Susceptibility   Enterococcus faecalis - MIC*    AMPICILLIN <=2 SENSITIVE Sensitive     NITROFURANTOIN <=16 SENSITIVE Sensitive     VANCOMYCIN >=32 RESISTANT Resistant     LINEZOLID 2 SENSITIVE Sensitive     * >=100,000 COLONIES/mL ENTEROCOCCUS FAECALIS   Pseudomonas aeruginosa - MIC*    CEFTAZIDIME <=1 SENSITIVE Sensitive     CIPROFLOXACIN >=4 RESISTANT Resistant     GENTAMICIN <=1 SENSITIVE Sensitive     IMIPENEM 2 SENSITIVE Sensitive     PIP/TAZO <=4 SENSITIVE  Sensitive     CEFEPIME 4 SENSITIVE Sensitive     * >=100,000 COLONIES/mL PSEUDOMONAS AERUGINOSA  Urine Culture     Status: Abnormal   Collection Time: 05/28/21  4:57 PM   Specimen: Urine, Clean Catch  Result Value Ref Range Status   Specimen Description URINE, CLEAN CATCH  Final   Special Requests   Final    NONE Performed at [facility], [place] [address] [place], [place] [address]    Culture (A)  Final    >=100,000 COLONIES/mL KLEBSIELLA PNEUMONIAE Confirmed Extended Spectrum Beta-Lactamase Producer (ESBL).  In bloodstream infections from ESBL organisms, carbapenems are preferred over piperacillin/tazobactam. They are shown to have a lower risk of mortality.    Report Status 05/31/2021 FINAL  Final   Organism ID, Bacteria KLEBSIELLA PNEUMONIAE (A)  Final      Susceptibility   Klebsiella pneumoniae - MIC*    AMPICILLIN >=32 RESISTANT Resistant     CEFAZOLIN >=64 RESISTANT Resistant     CEFEPIME >=32 RESISTANT Resistant     CEFTRIAXONE >=64 RESISTANT Resistant     CIPROFLOXACIN 2 RESISTANT Resistant     GENTAMICIN >=16 RESISTANT Resistant     IMIPENEM <=0.25 SENSITIVE Sensitive     NITROFURANTOIN 64 INTERMEDIATE Intermediate     TRIMETH/SULFA >=320 RESISTANT Resistant     AMPICILLIN/SULBACTAM >=32 RESISTANT Resistant     PIP/TAZO 16 SENSITIVE Sensitive     * >=100,000 COLONIES/mL KLEBSIELLA PNEUMONIAE    Presented with chest pain and SOB but also problems with urination (no dysuria and hematuria reported). Has hx of indwelling catheter. UA showing rare bacteria, WBC>50, large leukocytes, and negative nitrites. Previous urine cultures in 2022 growing: proteus, VRE, staph capitis, pseudomonas, enterococcus, and ESBL E Coli. Decision made at ED discharge to hold on treating empirically given concern for colonization in setting of indwelling catheter and await Ucx.  Plan: Wellness check to see if still any urinary symptoms. Given >100k and no previous hx of organism in culture  results, if still having urinary symptoms can order one time of fosfomycin 3g PO.   ED Provider: Dr Marcille Blanco, PharmD, BCCCP Clinical Pharmacist  Monday - Friday phone -  [phone] Saturday - Sunday phone - [phone]

## 2021-06-06 ENCOUNTER — Emergency Department (HOSPITAL_COMMUNITY)
Admission: EM | Admit: 2021-06-06 | Discharge: 2021-06-06 | Disposition: A | Discharge status: Home / Self Care | Payer: Medicare Other | Attending: Emergency Medicine | Admitting: Emergency Medicine

## 2021-06-06 DIAGNOSIS — Z87891 Personal history of nicotine dependence: Secondary | ICD-10-CM | POA: Diagnosis not present

## 2021-06-06 DIAGNOSIS — Z79899 Other long term (current) drug therapy: Secondary | ICD-10-CM | POA: Diagnosis not present

## 2021-06-06 DIAGNOSIS — R300 Dysuria: Secondary | ICD-10-CM | POA: Insufficient documentation

## 2021-06-06 DIAGNOSIS — I110 Hypertensive heart disease with heart failure: Secondary | ICD-10-CM | POA: Diagnosis not present

## 2021-06-06 DIAGNOSIS — I2510 Atherosclerotic heart disease of native coronary artery without angina pectoris: Secondary | ICD-10-CM | POA: Insufficient documentation

## 2021-06-06 DIAGNOSIS — R319 Hematuria, unspecified: Secondary | ICD-10-CM | POA: Insufficient documentation

## 2021-06-06 DIAGNOSIS — I5022 Chronic systolic (congestive) heart failure: Secondary | ICD-10-CM | POA: Insufficient documentation

## 2021-06-06 DIAGNOSIS — Z7902 Long term (current) use of antithrombotics/antiplatelets: Secondary | ICD-10-CM | POA: Diagnosis not present

## 2021-06-06 DIAGNOSIS — Z978 Presence of other specified devices: Secondary | ICD-10-CM

## 2021-06-06 LAB — URINALYSIS, ROUTINE W REFLEX MICROSCOPIC
Bilirubin Urine: NEGATIVE
Glucose, UA: NEGATIVE mg/dL
Ketones, ur: NEGATIVE mg/dL
Nitrite: POSITIVE — AB
Protein, ur: 100 mg/dL — AB
RBC / HPF: >50 RBC/hpf — ABNORMAL HIGH (ref 0–5)
Specific Gravity, Urine: 1.018 (ref 1.005–1.030)
WBC, UA: >50 WBC/hpf — ABNORMAL HIGH (ref 0–5)
pH: 5 (ref 5.0–8.0)

## 2021-06-06 MED ORDER — FOSFOMYCIN TROMETHAMINE 3 G PO PACK
3.0000 g | PACK | Freq: Once | ORAL | Status: AC
Start: 2021-06-06 — End: 2021-06-06
  Administered 2021-06-06: 3 g via ORAL
  Filled 2021-06-06: qty 3

## 2021-06-06 NOTE — Discharge Instructions (Addendum)
It was our pleasure to provide your ER care today - we hope that you feel better.  Drink plenty of fluids/stay well hydrated. Empty leg bag as need.   Follow up with your doctor in the next 2-3 days - we sent a urine culture today the results of which should be back in the next couple days - have your doctor follow up on that result then.   Return to ER if worse, new symptoms, fevers, new/severe pain, persistent vomiting, or other concern.

## 2021-06-06 NOTE — ED Provider Notes (Signed)
[facility] EMERGENCY DEPARTMENT Provider Note   CSN: [id number] Arrival date & time: 06/06/21  0813     History Chief Complaint  Patient presents with   urinary catheter   Hematuria    Ayvin L Valade is a 80 y.o. male.  Patient c/o dysuria, and noted faint blood tinge to blood in his leg bag. Symptoms acute onset, moderate, persistent, recurrent. Has chronic foley. States last changed earlier in month, hx uti. No fever, chills or sweats. No nausea, vomiting - indicates is hungry now. No abd or flank pain. No scrotal or testicular pain or swelling. Dneies current abx use.   The history is provided by the patient and medical records.  Hematuria Pertinent negatives include no chest pain, no abdominal pain and no headaches.      Past Medical History:  Diagnosis Date   BPH (benign prostatic hyperplasia)    CAD (coronary artery disease)    Chronic indwelling Foley catheter    CVA (cerebral vascular accident) ([place])    HTN (hypertension)    Neurogenic bladder    SSS (sick sinus syndrome) ([place])    s/p MDT ICD   Vitamin D deficiency     Patient Active Problem List   Diagnosis Date Noted   AMS (altered mental status) 05/11/2021   Hypotension 05/11/2021   Iron deficiency anemia 05/07/2021   Oral phase dysphagia 04/28/2021   Protein-calorie malnutrition, severe 03/27/2021   Toe infection    PVD (peripheral vascular disease) ([place])    Osteomyelitis of great toe of right foot ([place]) 03/20/2021   Wound infection 03/20/2021   Pacemaker 03/20/2021   Elevated alkaline phosphatase level 03/20/2021   Hyperlipidemia 03/20/2021   Hematuria 02/14/2021   Urinary tract infection associated with indwelling urethral catheter ([place])    Normocytic anemia 01/06/2021   Catheter-associated urinary tract infection ([place]) 93/23/5573   Toxic metabolic encephalopathy 22/07/5425   Chronic indwelling Foley catheter 12/15/2020   HTN (hypertension) 12/15/2020   History of ESBL  E. coli infection 12/15/2020   BPH (benign prostatic hyperplasia) 11/28/7626   Chronic systolic heart failure ([place]) 12/12/2019    Past Surgical History:  Procedure Laterality Date   ABDOMINAL AORTOGRAM W/LOWER EXTREMITY N/A 03/23/2021   Procedure: ABDOMINAL AORTOGRAM W/LOWER EXTREMITY;  Surgeon: Waynetta Sandy, MD;  Location: [facility];  Service: Cardiovascular;  Laterality: N/A;   AMPUTATION Right 04/01/2021   Procedure: RIGHT GREAT TOE AMPUTATION;  Surgeon: Newt Minion, MD;  Location: [facility];  Service: Orthopedics;  Laterality: Right;   APPENDECTOMY     APPLICATION OF WOUND VAC Right 04/01/2021   Procedure: APPLICATION OF WOUND VAC;  Surgeon: Newt Minion, MD;  Location: [facility];  Service: Orthopedics;  Laterality: Right;   CARDIAC SURGERY     FEMORAL-TIBIAL BYPASS GRAFT Right 03/26/2021   Procedure: BYPASS GRAFT FEMORAL-TIBIAL ARTERY.  Harvest right leg saphenous vein, right vein patch angioplasty posterior tibial.;  Surgeon: Marty Heck, MD;  Location: [facility];  Service: Vascular;  Laterality: Right;   PACEMAKER PLACEMENT         Family History  Family history unknown: Yes    Social History   Tobacco Use   Smoking status: Former    Types: Cigarettes   Smokeless tobacco: Never  Substance Use Topics   Alcohol use: Not Currently   Drug use: Never    Home Medications Prior to Admission medications   Medication Sig Start Date End Date Taking? Authorizing Provider  acetaminophen (TYLENOL) 325 MG tablet  Take 650 mg by mouth every 4 (four) hours as needed for mild pain.    [provider]  clopidogrel (PLAVIX) 75 MG tablet Take 75 mg by mouth at bedtime.    [provider]  divalproex (DEPAKOTE) 125 MG DR tablet Take 125 mg by mouth at bedtime.    [provider]  finasteride (PROSCAR) 5 MG tablet Take 5 mg by mouth every morning.    [provider]  HYDROcodone-acetaminophen (NORCO/VICODIN) 5-325 MG tablet Take  1-2 tablets by mouth every 4 (four) hours as needed for moderate pain (pain score 4-6). 04/05/21   Shawna Clamp, MD  nitroGLYCERIN (NITROSTAT) 0.4 MG SL tablet Place 0.4 mg under the tongue every 5 (five) minutes as needed for chest pain.    [provider]  pantoprazole (PROTONIX) 40 MG tablet Take 1 tablet (40 mg total) by mouth daily. 04/06/21   Shawna Clamp, MD  polyethylene glycol (MIRALAX / GLYCOLAX) 17 g packet Take 17 g by mouth daily as needed for mild constipation. 05/14/21   Merrily Brittle, DO  rosuvastatin (CRESTOR) 10 MG tablet Take 10 mg by mouth every morning.    [provider]  vitamin B-12 (CYANOCOBALAMIN) 1000 MCG tablet Take 1 tablet (1,000 mcg total) by mouth daily. 01/08/21   Lincoln Brigham, PA-C  Vitamin D, Ergocalciferol, (DRISDOL) 1.25 MG (50000 UNIT) CAPS capsule Take 50,000 Units by mouth every Monday.    [provider]    Allergies    Food, Aspirin, and Northern quahog clam (m. mercenaria) skin test  Review of Systems   Review of Systems  Constitutional:  Negative for chills and fever.  HENT:  Negative for nosebleeds.   Eyes:  Negative for redness.  Respiratory:  Negative for cough.   Cardiovascular:  Negative for chest pain.  Gastrointestinal:  Negative for abdominal pain, blood in stool and vomiting.  Genitourinary:  Positive for dysuria and hematuria. Negative for flank pain.  Musculoskeletal:  Negative for back pain.  Skin:  Negative for rash.  Neurological:  Negative for headaches.  Hematological:  Does not bruise/bleed easily.  Psychiatric/Behavioral:  Negative for agitation.    Physical Exam Updated Vital Signs BP (!) 146/80    Pulse 71    Temp 97.7 F (36.5 C) (Oral)    Resp (!) 29    SpO2 100%   Physical Exam Vitals and nursing note reviewed.  Constitutional:      Appearance: Normal appearance. He is well-developed.  HENT:     Head: Atraumatic.     Nose: Nose normal.     Mouth/Throat:     Mouth: Mucous membranes  are moist.     Pharynx: Oropharynx is clear.  Eyes:     General: No scleral icterus.    Conjunctiva/sclera: Conjunctivae normal.  Neck:     Trachea: No tracheal deviation.  Cardiovascular:     Rate and Rhythm: Normal rate and regular rhythm.     Pulses: Normal pulses.     Heart sounds: Normal heart sounds. No murmur heard.   No friction rub. No gallop.  Pulmonary:     Effort: Pulmonary effort is normal. No accessory muscle usage or respiratory distress.     Breath sounds: Normal breath sounds.  Abdominal:     General: Bowel sounds are normal. There is no distension.     Palpations: Abdomen is soft.     Tenderness: There is no abdominal tenderness. There is no guarding.  Genitourinary:    Comments: No cva  tenderness. Normal external gu exam. No scrotal or testicular pain, swelling or tenderness. Yellow, mildly blood tinged urine in leg bag.  Musculoskeletal:        General: No swelling.     Cervical back: Normal range of motion and neck supple. No rigidity.  Skin:    General: Skin is warm and dry.     Findings: No rash.  Neurological:     Mental Status: He is alert.     Comments: Alert, speech clear.   Psychiatric:        Mood and Affect: Mood normal.    ED Results / Procedures / Treatments   Labs (all labs ordered are listed, but only abnormal results are displayed) Results for orders placed or performed during the hospital encounter of 06/06/21  Urinalysis, Routine w reflex microscopic Urine, Bag (ped)  Result Value Ref Range   Color, Urine AMBER (A) YELLOW   APPearance CLOUDY (A) CLEAR   Specific Gravity, Urine 1.018 1.005 - 1.030   pH 5.0 5.0 - 8.0   Glucose, UA NEGATIVE NEGATIVE mg/dL   Hgb urine dipstick MODERATE (A) NEGATIVE   Bilirubin Urine NEGATIVE NEGATIVE   Ketones, ur NEGATIVE NEGATIVE mg/dL   Protein, ur 100 (A) NEGATIVE mg/dL   Nitrite POSITIVE (A) NEGATIVE   Leukocytes,Ua MODERATE (A) NEGATIVE   RBC / HPF >50 (H) 0 - 5 RBC/hpf   WBC, UA >50 (H) 0 - 5  WBC/hpf   Bacteria, UA RARE (A) NONE SEEN   Squamous Epithelial / LPF 0-5 0 - 5   WBC Clumps PRESENT    Mucus PRESENT    DG Chest 2 View  Result Date: 05/28/2021 CLINICAL DATA:  Left-sided chest pain for 2 days with shortness of breath, initial encounter EXAM: CHEST - 2 VIEW COMPARISON:  05/11/2021 FINDINGS: Cardiac shadow is within normal limits. Postsurgical changes are noted. Defibrillator is again seen. Lungs are well aerated bilaterally. No focal infiltrate or sizable effusion is noted. Degenerative changes of the thoracic spine are seen. IMPRESSION: No active cardiopulmonary disease. Electronically Signed   By: Inez Catalina M.D.   On: 05/28/2021 17:37   CT Head Wo Contrast  Result Date: 05/11/2021 CLINICAL DATA:  Mental status change of unknown cause. Weakness and hypotension EXAM: CT HEAD WITHOUT CONTRAST TECHNIQUE: Contiguous axial images were obtained from the base of the skull through the vertex without intravenous contrast. COMPARISON:  04/06/2021 FINDINGS: Brain: Age related volume loss. No focal abnormality seen affecting the brainstem or cerebellum. Chronic small-vessel ischemic changes affect the cerebral hemispheric white matter. Old small vessel infarction of the right thalamus. No sign of acute infarction, mass lesion, hemorrhage, hydrocephalus or extra-axial collection. Vascular: There is atherosclerotic calcification of the major vessels at the base of the brain. Skull: Negative Sinuses/Orbits: Clear/normal Other: None IMPRESSION: No acute CT finding. Atrophy and chronic small-vessel ischemic change of the cerebral hemispheric white matter. Cerebral Atrophy (ICD10-G31.9). Electronically Signed   By: Nelson Chimes M.D.   On: 05/11/2021 12:57   DG Chest Portable 1 View  Result Date: 05/11/2021 CLINICAL DATA:  Hypotension.  Shortness of breath. EXAM: PORTABLE CHEST 1 VIEW COMPARISON:  03/22/2021 FINDINGS: The heart size and mediastinal contours are within normal limits. Aortic  atherosclerotic calcification noted. Prior CABG again noted. Dual lead transvenous pacemaker remains in appropriate position. Both lungs are clear. Skin fold again seen projecting over the right hemithorax. The visualized skeletal structures are unremarkable. IMPRESSION: No active disease. Electronically Signed   By: Myles Rosenthal.D.  On: 05/11/2021 12:40   DG Foot Complete Right  Result Date: 05/11/2021 CLINICAL DATA:  Possible sepsis.  Evaluate for osteomyelitis. 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 Calcaneal and Achilles spurs. No osseous destruction. No soft tissue gas. IMPRESSION: No evidence of acute osteomyelitis. Status post first phalanx and metatarsal amputation with similar hyperattenuation in the soft tissues of the surgical site. 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 This could be correlated with physical exam. Electronically Signed   By: Abigail Miyamoto M.D.   On: 05/11/2021 13:52    EKG None  Radiology No results found.  Procedures Procedures   Medications Ordered in ED Medications  fosfomycin (MONUROL) packet 3 g (has no administration in time range)    ED Course  I have reviewed the triage vital signs and the nursing notes.  Pertinent labs & imaging results that were available during my care of the patient were reviewed by me and considered in my medical decision making (see chart for details).    MDM Rules/Calculators/A&P                         Labs sent.   Reviewed nursing notes and prior charts for additional history. Recent visits reviewed - prior cultures positive for variety of different organisms, unclear whether colonized or acute infections.   Foley cath changed.   As today, with dysuria/symptoms, will tx. Cx is  pending. Fosfamycin po   Pt is hungry. No nv.  Pt is overall well, not toxic appearing. No abd pain or tenderness.   Pt currently appears stable for d/c.   Rec pcp/urology f/u as outpt.   Return precautions provided.   Final Clinical Impression(s) / ED Diagnoses Final diagnoses:  None    Rx / DC Orders ED Discharge Orders     None        Lajean Saver, MD 06/06/21 1248

## 2021-06-06 NOTE — ED Triage Notes (Signed)
Pt. Stated, blood in catheter for the last 3 days and its painful.

## 2021-06-06 NOTE — ED Notes (Signed)
Ride arranged via granddaughter [name]. Pt placed in lobby by window per request, "to keep a lookout." [place] sandwich provided, discharge paperwork in hand

## 2021-06-06 NOTE — ED Provider Notes (Addendum)
Emergency Medicine Provider Triage Evaluation Note  CHRISTIAAN STREBECK , a 80 y.o. male  was evaluated in triage.  Pt complains of catheter issues.  Patient reports he has had a catheter in place for long time due to urinary retention and neurogenic bladder, patient reports for the past 3 days he has been having burning and pain from his catheter.  He had not noticed blood in his urine but there is some blood present in the leg bag.  He denies associated abdominal or flank pain.  No chills, fever or vomiting.  Catheter last changed about a week ago  Review of Systems  Positive: Dysuria, catheter pain Negative: Abdominal pain, fever, flank pain  Physical Exam  BP 138/82 (BP Location: Right Arm)    Pulse 65    Temp 97.7 F (36.5 C) (Oral)    SpO2 100%  Gen:   Awake, no distress   Resp:  Normal effort  MSK:   Moves extremities without difficulty  Other:  Foley catheter in place, tubing appears to be a bit taut when patient is standing, there is blood-tinged urine in the catheter bag, mild suprapubic tenderness, no CVA tenderness  Medical Decision Making  Medically screening exam initiated at 8:30 AM.  Appropriate orders placed.  Mamoudou L Detweiler was informed that the remainder of the evaluation will be completed by another provider, this initial triage assessment does not replace that evaluation, and the importance of remaining in the ED until their evaluation is complete.      Jacqlyn Larsen, PA-C 06/06/21 2233    Lajean Saver, MD 06/06/21 1320

## 2021-06-10 LAB — URINE CULTURE: Culture: >=100000 — AB

## 2021-06-11 ENCOUNTER — Telehealth (HOSPITAL_BASED_OUTPATIENT_CLINIC_OR_DEPARTMENT_OTHER): Payer: Self-pay | Admitting: *Deleted

## 2021-06-11 NOTE — Telephone Encounter (Signed)
Post ED Visit - Positive Culture Follow-up  Culture report reviewed by antimicrobial stewardship pharmacist: [facility] Team []  Elenor Quinones, Pharm.D. [x]  Heide Guile, Pharm.D., BCPS AQ-ID []  Parks Neptune, Pharm.D., BCPS []  Alycia Rossetti, Pharm.D., BCPS []  [place], Pharm.D., BCPS, AAHIVP []  Legrand Como, Pharm.D., BCPS, AAHIVP []  Salome Arnt, PharmD, BCPS []  Johnnette Gourd, PharmD, BCPS []  Hughes Better, PharmD, BCPS []  Leeroy Cha, PharmD []  Laqueta Linden, PharmD, BCPS []  Albertina Parr, PharmD  [facility] Team []  Leodis Sias, PharmD []  Lindell Spar, PharmD []  Royetta Asal, PharmD []  Graylin Shiver, Rph []  Rema Fendt) Glennon Mac, PharmD []  Arlyn Dunning, PharmD []  Netta Cedars, PharmD []  Dia Sitter, PharmD []  Leone Haven, PharmD []  Gretta Arab, PharmD []  Theodis Shove, PharmD []  Peggyann Juba, PharmD []  Reuel Boom, PharmD   Positive urine culture no further patient follow-up is required at this time.  Rosie Fate 06/11/2021, 11:33 AM

## 2021-06-20 ENCOUNTER — Encounter (HOSPITAL_COMMUNITY): Payer: Self-pay | Admitting: *Deleted

## 2021-06-20 ENCOUNTER — Other Ambulatory Visit: Payer: Self-pay

## 2021-06-20 ENCOUNTER — Emergency Department (HOSPITAL_COMMUNITY): Payer: Medicare Other

## 2021-06-20 ENCOUNTER — Inpatient Hospital Stay (HOSPITAL_COMMUNITY)
Admission: EM | Admit: 2021-06-20 | Discharge: 2021-06-24 | DRG: 193 | Disposition: A | Discharge status: Home Health | Payer: Medicare Other | Attending: Internal Medicine | Admitting: Internal Medicine

## 2021-06-20 DIAGNOSIS — Z20822 Contact with and (suspected) exposure to covid-19: Secondary | ICD-10-CM | POA: Diagnosis present

## 2021-06-20 DIAGNOSIS — R6520 Severe sepsis without septic shock: Secondary | ICD-10-CM | POA: Diagnosis present

## 2021-06-20 DIAGNOSIS — I495 Sick sinus syndrome: Secondary | ICD-10-CM | POA: Diagnosis present

## 2021-06-20 DIAGNOSIS — Z95820 Peripheral vascular angioplasty status with implants and grafts: Secondary | ICD-10-CM

## 2021-06-20 DIAGNOSIS — Z2239 Carrier of other specified bacterial diseases: Secondary | ICD-10-CM

## 2021-06-20 DIAGNOSIS — R1310 Dysphagia, unspecified: Secondary | ICD-10-CM | POA: Diagnosis present

## 2021-06-20 DIAGNOSIS — D649 Anemia, unspecified: Secondary | ICD-10-CM | POA: Diagnosis present

## 2021-06-20 DIAGNOSIS — R64 Cachexia: Secondary | ICD-10-CM | POA: Diagnosis present

## 2021-06-20 DIAGNOSIS — E785 Hyperlipidemia, unspecified: Secondary | ICD-10-CM | POA: Diagnosis present

## 2021-06-20 DIAGNOSIS — J189 Pneumonia, unspecified organism: Principal | ICD-10-CM | POA: Diagnosis present

## 2021-06-20 DIAGNOSIS — R0602 Shortness of breath: Secondary | ICD-10-CM | POA: Diagnosis not present

## 2021-06-20 DIAGNOSIS — Z89421 Acquired absence of other right toe(s): Secondary | ICD-10-CM

## 2021-06-20 DIAGNOSIS — N400 Enlarged prostate without lower urinary tract symptoms: Secondary | ICD-10-CM | POA: Diagnosis present

## 2021-06-20 DIAGNOSIS — R7401 Elevation of levels of liver transaminase levels: Secondary | ICD-10-CM | POA: Diagnosis present

## 2021-06-20 DIAGNOSIS — I5022 Chronic systolic (congestive) heart failure: Secondary | ICD-10-CM | POA: Diagnosis present

## 2021-06-20 DIAGNOSIS — N319 Neuromuscular dysfunction of bladder, unspecified: Secondary | ICD-10-CM | POA: Diagnosis present

## 2021-06-20 DIAGNOSIS — I2510 Atherosclerotic heart disease of native coronary artery without angina pectoris: Secondary | ICD-10-CM | POA: Diagnosis present

## 2021-06-20 DIAGNOSIS — Z960 Presence of urogenital implants: Secondary | ICD-10-CM | POA: Diagnosis present

## 2021-06-20 DIAGNOSIS — J9601 Acute respiratory failure with hypoxia: Secondary | ICD-10-CM | POA: Diagnosis present

## 2021-06-20 DIAGNOSIS — I110 Hypertensive heart disease with heart failure: Secondary | ICD-10-CM | POA: Diagnosis present

## 2021-06-20 DIAGNOSIS — K219 Gastro-esophageal reflux disease without esophagitis: Secondary | ICD-10-CM | POA: Diagnosis present

## 2021-06-20 DIAGNOSIS — Z993 Dependence on wheelchair: Secondary | ICD-10-CM

## 2021-06-20 DIAGNOSIS — Z950 Presence of cardiac pacemaker: Secondary | ICD-10-CM

## 2021-06-20 DIAGNOSIS — Z681 Body mass index (BMI) 19 or less, adult: Secondary | ICD-10-CM

## 2021-06-20 DIAGNOSIS — J439 Emphysema, unspecified: Secondary | ICD-10-CM | POA: Diagnosis present

## 2021-06-20 DIAGNOSIS — Z87891 Personal history of nicotine dependence: Secondary | ICD-10-CM

## 2021-06-20 DIAGNOSIS — Z7902 Long term (current) use of antithrombotics/antiplatelets: Secondary | ICD-10-CM

## 2021-06-20 DIAGNOSIS — I5042 Chronic combined systolic (congestive) and diastolic (congestive) heart failure: Secondary | ICD-10-CM | POA: Diagnosis present

## 2021-06-20 DIAGNOSIS — I69322 Dysarthria following cerebral infarction: Secondary | ICD-10-CM

## 2021-06-20 DIAGNOSIS — A419 Sepsis, unspecified organism: Secondary | ICD-10-CM | POA: Diagnosis present

## 2021-06-20 DIAGNOSIS — I69391 Dysphagia following cerebral infarction: Secondary | ICD-10-CM

## 2021-06-20 DIAGNOSIS — R0902 Hypoxemia: Secondary | ICD-10-CM

## 2021-06-20 DIAGNOSIS — E559 Vitamin D deficiency, unspecified: Secondary | ICD-10-CM | POA: Diagnosis present

## 2021-06-20 DIAGNOSIS — Z79899 Other long term (current) drug therapy: Secondary | ICD-10-CM

## 2021-06-20 DIAGNOSIS — Z886 Allergy status to analgesic agent status: Secondary | ICD-10-CM

## 2021-06-20 DIAGNOSIS — R778 Other specified abnormalities of plasma proteins: Secondary | ICD-10-CM | POA: Diagnosis present

## 2021-06-20 HISTORY — DX: Gastro-esophageal reflux disease without esophagitis: K21.9

## 2021-06-20 LAB — CBC WITH DIFFERENTIAL/PLATELET
Abs Immature Granulocytes: 0.01 10*3/uL (ref 0.00–0.07)
Basophils Absolute: 0.1 10*3/uL (ref 0.0–0.1)
Basophils Relative: 1 %
Eosinophils Absolute: 0.1 10*3/uL (ref 0.0–0.5)
Eosinophils Relative: 3 %
HCT: 36.9 % — ABNORMAL LOW (ref 39.0–52.0)
Hemoglobin: 10.7 g/dL — ABNORMAL LOW (ref 13.0–17.0)
Immature Granulocytes: 0 %
Lymphocytes Relative: 26 %
Lymphs Abs: 1 10*3/uL (ref 0.7–4.0)
MCH: 23.7 pg — ABNORMAL LOW (ref 26.0–34.0)
MCHC: 29 g/dL — ABNORMAL LOW (ref 30.0–36.0)
MCV: 81.6 fL (ref 80.0–100.0)
Monocytes Absolute: 0.4 10*3/uL (ref 0.1–1.0)
Monocytes Relative: 10 %
Neutro Abs: 2.3 10*3/uL (ref 1.7–7.7)
Neutrophils Relative %: 60 %
Platelets: 171 10*3/uL (ref 150–400)
RBC: 4.52 MIL/uL (ref 4.22–5.81)
RDW: 22.9 % — ABNORMAL HIGH (ref 11.5–15.5)
WBC: 3.9 10*3/uL — ABNORMAL LOW (ref 4.0–10.5)
nRBC: 0 % (ref 0.0–0.2)

## 2021-06-20 LAB — BASIC METABOLIC PANEL
Anion gap: 8 (ref 5–15)
BUN: 16 mg/dL (ref 8–23)
CO2: 25 mmol/L (ref 22–32)
Calcium: 9.8 mg/dL (ref 8.9–10.3)
Chloride: 106 mmol/L (ref 98–111)
Creatinine, Ser: 0.77 mg/dL (ref 0.61–1.24)
GFR, Estimated: >60 mL/min (ref >60)
Glucose, Bld: 120 mg/dL — ABNORMAL HIGH (ref 70–99)
Potassium: 3.7 mmol/L (ref 3.5–5.1)
Sodium: 139 mmol/L (ref 135–145)

## 2021-06-20 LAB — TROPONIN I (HIGH SENSITIVITY): Troponin I (High Sensitivity): 23 ng/L — ABNORMAL HIGH (ref <18)

## 2021-06-20 NOTE — ED Triage Notes (Signed)
Pt daughter states the pt has appeared more short of breath when moving and talking. Also sleeping more than normal today. Would not take his medications this morning. Saturations were 85% on room air, 2 liters applied, 91%

## 2021-06-20 NOTE — ED Notes (Signed)
Sats were difficult to obtain with good pleth. Multiple attempts to obtain good sat reading. Family gave multiple breathing treatments at home. Patient placed on 2 Liters of oxygen per nurse Benjamine Mola. Normally does not wear oxygen at home. Patients sats at 85% on 2 Liters.

## 2021-06-20 NOTE — ED Notes (Signed)
Charge nurse notified by nurse Benjamine Mola of patients O2 sats

## 2021-06-20 NOTE — ED Provider Notes (Signed)
[facility] EMERGENCY DEPARTMENT Provider Note   CSN: [id number] Arrival date & time: 06/20/21  2021     History  Chief Complaint  Patient presents with   Shortness of Breath    Todd Dunn is a 81 y.o. male.   Shortness of Breath   Pt started having trouble with breathing a couple of days ago.  He seems to having more labored breathing.  Family gave him some albuterol that another family member uses and it helps.  Normally does not use breathing treatments or oxygen.  No fever.  No chest pain.  No leg swelling  Home Medications Prior to Admission medications   Medication Sig Start Date End Date Taking? Authorizing Provider  acetaminophen (TYLENOL) 325 MG tablet Take 650 mg by mouth every 4 (four) hours as needed for mild pain.    [provider]  clopidogrel (PLAVIX) 75 MG tablet Take 75 mg by mouth at bedtime.    [provider]  divalproex (DEPAKOTE) 125 MG DR tablet Take 125 mg by mouth at bedtime.    [provider]  finasteride (PROSCAR) 5 MG tablet Take 5 mg by mouth every morning.    [provider]  HYDROcodone-acetaminophen (NORCO/VICODIN) 5-325 MG tablet Take 1-2 tablets by mouth every 4 (four) hours as needed for moderate pain (pain score 4-6). 04/05/21   Shawna Clamp, MD  nitroGLYCERIN (NITROSTAT) 0.4 MG SL tablet Place 0.4 mg under the tongue every 5 (five) minutes as needed for chest pain.    [provider]  pantoprazole (PROTONIX) 40 MG tablet Take 1 tablet (40 mg total) by mouth daily. 04/06/21   Shawna Clamp, MD  polyethylene glycol (MIRALAX / GLYCOLAX) 17 g packet Take 17 g by mouth daily as needed for mild constipation. 05/14/21   Merrily Brittle, DO  rosuvastatin (CRESTOR) 10 MG tablet Take 10 mg by mouth every morning.    [provider]  vitamin B-12 (CYANOCOBALAMIN) 1000 MCG tablet Take 1 tablet (1,000 mcg total) by mouth daily. 01/08/21   Lincoln Brigham, PA-C  Vitamin D,  Ergocalciferol, (DRISDOL) 1.25 MG (50000 UNIT) CAPS capsule Take 50,000 Units by mouth every Monday.    [provider]      Allergies    Food, Aspirin, and Northern quahog clam (m. mercenaria) skin test    Review of Systems   Review of Systems  Respiratory:  Positive for shortness of breath.    Physical Exam Updated Vital Signs BP (!) 151/81    Pulse 73    Temp 98.3 F (36.8 C)    Resp (!) 22    SpO2 100%  Physical Exam Vitals and nursing note reviewed.  Constitutional:      Appearance: He is well-developed. He is not diaphoretic.  HENT:     Head: Normocephalic and atraumatic.     Right Ear: External ear normal.     Left Ear: External ear normal.  Eyes:     General: No scleral icterus.       Right eye: No discharge.        Left eye: No discharge.     Conjunctiva/sclera: Conjunctivae normal.  Neck:     Trachea: No tracheal deviation.  Cardiovascular:     Rate and Rhythm: Normal rate and regular rhythm.  Pulmonary:     Effort: Pulmonary effort is normal. No respiratory distress.     Breath sounds: No stridor. Decreased breath sounds present. No wheezing or rales.  Abdominal:  General: Bowel sounds are normal. There is no distension.     Palpations: Abdomen is soft.     Tenderness: There is no abdominal tenderness. There is no guarding or rebound.  Musculoskeletal:        General: No tenderness or deformity.     Cervical back: Neck supple.     Right lower leg: No edema.     Left lower leg: No edema.  Skin:    General: Skin is warm and dry.     Findings: No rash.  Neurological:     General: No focal deficit present.     Mental Status: He is alert.     Cranial Nerves: No cranial nerve deficit (no facial droop, extraocular movements intact, no slurred speech).     Sensory: No sensory deficit.     Motor: No abnormal muscle tone or seizure activity.     Coordination: Coordination normal.  Psychiatric:        Mood and Affect: Mood normal.    ED Results /  Procedures / Treatments   Labs (all labs ordered are listed, but only abnormal results are displayed) Labs Reviewed  CBC WITH DIFFERENTIAL/PLATELET - Abnormal; Notable for the following components:      Result Value   WBC 3.9 (*)    Hemoglobin 10.7 (*)    HCT 36.9 (*)    MCH 23.7 (*)    MCHC 29.0 (*)    RDW 22.9 (*)    All other components within normal limits  BASIC METABOLIC PANEL - Abnormal; Notable for the following components:   Glucose, Bld 120 (*)    All other components within normal limits  TROPONIN I (HIGH SENSITIVITY) - Abnormal; Notable for the following components:   Troponin I (High Sensitivity) 23 (*)    All other components within normal limits  RESP PANEL BY RT-PCR (FLU A&B, COVID) [id number]  TROPONIN I (HIGH SENSITIVITY)    EKG EKG Interpretation  Date/Time:  Saturday June 20 2021 20:56:16 EST Ventricular Rate:  78 PR Interval:  132 QRS Duration: 112 QT Interval:  406 QTC Calculation: 462 R Axis:   2 Text Interpretation: Sinus rhythm with occasional Premature ventricular complexes and Premature atrial complexes Moderate voltage criteria for LVH, may be normal variant ( Sokolow-Lyon , Cornell product ) ST & T wave abnormality, consider inferolateral ischemia Prolonged QT Abnormal ECG When compared with ECG of 28-May-2021 16:45, No significant change was found Confirmed by Dorie Rank [phone]) on 06/20/2021 11:00:38 PM  Radiology DG Chest 2 View  Result Date: 06/20/2021 CLINICAL DATA:  Altered mental status with worsening shortness of breath. EXAM: CHEST - 2 VIEW COMPARISON:  May 28, 2021 FINDINGS: There is a dual lead AICD with stable lead wire positioning. Multiple sternal wires and vascular clips are noted. Very mild, bilateral infrahilar atelectasis is seen. There is no evidence of acute infiltrate, pleural effusion or pneumothorax. The heart size and mediastinal contours are within normal limits. Moderate severity calcification of the aortic arch is noted.  Multilevel degenerative changes seen throughout the thoracic spine. IMPRESSION: 1. Evidence of prior median sternotomy/CABG. 2. Very mild, bilateral infrahilar atelectasis. Electronically Signed   By: Virgina Norfolk M.D.   On: 06/20/2021 21:46    Procedures Procedures    Medications Ordered in ED Medications - No data to display  ED Course/ Medical Decision Making/ A&P Clinical Course as of 06/20/21 2336  Sat Jun 20, 2021  2209 DG Chest 2 View Chest x-ray images and report reviewed.  No definitive pneumonia or pneumothorax [JK]  2333 CBC shows leukopenia.  Anemia stable.  Initial troponin elevated at 23.  Metabolic panel normal. [JK]  2333 Patient does have a new oxygen requirement will order CT angio to rule out PE [JK]  2333 Care turned over to Dr Laverta Baltimore [JK]    Clinical Course User Index [JK] Dorie Rank, MD                           Medical Decision Making Amount and/or Complexity of Data Reviewed Independent Historian: caregiver Labs: ordered. Decision-making details documented in ED Course. Radiology: ordered and independent interpretation performed. Decision-making details documented in ED Course.  Risk Decision regarding hospitalization.   No history of COPD or asthma the patient has been a smoker in the past.  Does have new oxygen requirement.  Patient also has slightly elevated troponins.  Concerned about the possibility of PE so we will proceed with CT angiogram.  COVID and flu are pending.       Final Clinical Impression(s) / ED Diagnoses Final diagnoses:  Hypoxia     Dorie Rank, MD 06/20/21 2336

## 2021-06-20 NOTE — ED Provider Notes (Signed)
Blood pressure 97/77, pulse 75, temperature 98.3 F (36.8 C), resp. rate 17, SpO2 98 %.  Assuming care from Dr. Tomi Bamberger.  In short, Todd Dunn is a 81 y.o. male with a chief complaint of Shortness of Breath .  Refer to the original H&P for additional details.  The current plan of care is to follow up on labs and CTA. Patient with new O2 requirement. Todd Dunn history of smoking but no formal COPD diagnosis.   01:00 AM  CTA of the chest independently evaluated by me.  Agree with radiology interpretation.  No pulmonary embolism.  Patient does have left upper lobe infiltrate.  Remaining labs are resulted.  Troponin is flat at 23.  BNP at 677.  Patient has AICD with RF of 30-35% on ECHO from 03/2021. Will give 20 mg of lasix IV as well. Plan for admit with new O2 requirement.    EKG Interpretation  Date/Time:  Saturday June 20 2021 20:56:16 EST Ventricular Rate:  78 PR Interval:  132 QRS Duration: 112 QT Interval:  406 QTC Calculation: 462 R Axis:   2 Text Interpretation: Sinus rhythm with occasional Premature ventricular complexes and Premature atrial complexes Moderate voltage criteria for LVH, may be normal variant ( Sokolow-Lyon , Cornell product ) ST & T wave abnormality, consider inferolateral ischemia Prolonged QT Abnormal ECG When compared with ECG of 28-May-2021 16:45, No significant change was found Confirmed by Dorie Rank [phone]) on 06/20/2021 11:00:38 PM       Discussed patient's case with TRH to request admission. Patient and family (if present) updated with plan. Care transferred to [facility] service.  I reviewed all nursing notes, vitals, pertinent old records, EKGs, labs, imaging (as available).     Margette Fast, MD 06/21/21 [phone]

## 2021-06-21 ENCOUNTER — Emergency Department (HOSPITAL_COMMUNITY): Payer: Medicare Other

## 2021-06-21 ENCOUNTER — Encounter (HOSPITAL_COMMUNITY): Payer: Self-pay | Admitting: Internal Medicine

## 2021-06-21 DIAGNOSIS — R6520 Severe sepsis without septic shock: Secondary | ICD-10-CM

## 2021-06-21 DIAGNOSIS — I495 Sick sinus syndrome: Secondary | ICD-10-CM | POA: Diagnosis present

## 2021-06-21 DIAGNOSIS — A419 Sepsis, unspecified organism: Secondary | ICD-10-CM | POA: Diagnosis not present

## 2021-06-21 DIAGNOSIS — E785 Hyperlipidemia, unspecified: Secondary | ICD-10-CM | POA: Diagnosis present

## 2021-06-21 DIAGNOSIS — R1310 Dysphagia, unspecified: Secondary | ICD-10-CM | POA: Diagnosis present

## 2021-06-21 DIAGNOSIS — D649 Anemia, unspecified: Secondary | ICD-10-CM | POA: Diagnosis present

## 2021-06-21 DIAGNOSIS — J9601 Acute respiratory failure with hypoxia: Secondary | ICD-10-CM | POA: Diagnosis present

## 2021-06-21 DIAGNOSIS — I5022 Chronic systolic (congestive) heart failure: Secondary | ICD-10-CM | POA: Diagnosis not present

## 2021-06-21 DIAGNOSIS — I110 Hypertensive heart disease with heart failure: Secondary | ICD-10-CM | POA: Diagnosis present

## 2021-06-21 DIAGNOSIS — N319 Neuromuscular dysfunction of bladder, unspecified: Secondary | ICD-10-CM | POA: Diagnosis present

## 2021-06-21 DIAGNOSIS — R0602 Shortness of breath: Secondary | ICD-10-CM | POA: Diagnosis present

## 2021-06-21 DIAGNOSIS — I2510 Atherosclerotic heart disease of native coronary artery without angina pectoris: Secondary | ICD-10-CM | POA: Diagnosis present

## 2021-06-21 DIAGNOSIS — J439 Emphysema, unspecified: Secondary | ICD-10-CM | POA: Diagnosis present

## 2021-06-21 DIAGNOSIS — I69322 Dysarthria following cerebral infarction: Secondary | ICD-10-CM | POA: Diagnosis not present

## 2021-06-21 DIAGNOSIS — R778 Other specified abnormalities of plasma proteins: Secondary | ICD-10-CM | POA: Diagnosis present

## 2021-06-21 DIAGNOSIS — Z960 Presence of urogenital implants: Secondary | ICD-10-CM | POA: Diagnosis present

## 2021-06-21 DIAGNOSIS — Z20822 Contact with and (suspected) exposure to covid-19: Secondary | ICD-10-CM | POA: Diagnosis present

## 2021-06-21 DIAGNOSIS — N400 Enlarged prostate without lower urinary tract symptoms: Secondary | ICD-10-CM | POA: Diagnosis present

## 2021-06-21 DIAGNOSIS — J189 Pneumonia, unspecified organism: Principal | ICD-10-CM | POA: Diagnosis present

## 2021-06-21 DIAGNOSIS — Z681 Body mass index (BMI) 19 or less, adult: Secondary | ICD-10-CM | POA: Diagnosis not present

## 2021-06-21 DIAGNOSIS — Z95820 Peripheral vascular angioplasty status with implants and grafts: Secondary | ICD-10-CM | POA: Diagnosis not present

## 2021-06-21 DIAGNOSIS — Z950 Presence of cardiac pacemaker: Secondary | ICD-10-CM | POA: Diagnosis not present

## 2021-06-21 DIAGNOSIS — R7401 Elevation of levels of liver transaminase levels: Secondary | ICD-10-CM | POA: Diagnosis present

## 2021-06-21 DIAGNOSIS — K219 Gastro-esophageal reflux disease without esophagitis: Secondary | ICD-10-CM | POA: Diagnosis present

## 2021-06-21 DIAGNOSIS — R64 Cachexia: Secondary | ICD-10-CM | POA: Diagnosis present

## 2021-06-21 DIAGNOSIS — I5042 Chronic combined systolic (congestive) and diastolic (congestive) heart failure: Secondary | ICD-10-CM | POA: Diagnosis present

## 2021-06-21 DIAGNOSIS — I69391 Dysphagia following cerebral infarction: Secondary | ICD-10-CM | POA: Diagnosis not present

## 2021-06-21 DIAGNOSIS — E559 Vitamin D deficiency, unspecified: Secondary | ICD-10-CM | POA: Diagnosis present

## 2021-06-21 LAB — CBC WITH DIFFERENTIAL/PLATELET
Abs Immature Granulocytes: 0.01 10*3/uL (ref 0.00–0.07)
Basophils Absolute: 0 10*3/uL (ref 0.0–0.1)
Basophils Relative: 1 %
Eosinophils Absolute: 0.1 10*3/uL (ref 0.0–0.5)
Eosinophils Relative: 2 %
HCT: 37.7 % — ABNORMAL LOW (ref 39.0–52.0)
Hemoglobin: 11 g/dL — ABNORMAL LOW (ref 13.0–17.0)
Immature Granulocytes: 0 %
Lymphocytes Relative: 31 %
Lymphs Abs: 1.2 10*3/uL (ref 0.7–4.0)
MCH: 24 pg — ABNORMAL LOW (ref 26.0–34.0)
MCHC: 29.2 g/dL — ABNORMAL LOW (ref 30.0–36.0)
MCV: 82.1 fL (ref 80.0–100.0)
Monocytes Absolute: 0.4 10*3/uL (ref 0.1–1.0)
Monocytes Relative: 10 %
Neutro Abs: 2.1 10*3/uL (ref 1.7–7.7)
Neutrophils Relative %: 56 %
Platelets: 178 10*3/uL (ref 150–400)
RBC: 4.59 MIL/uL (ref 4.22–5.81)
RDW: 22.7 % — ABNORMAL HIGH (ref 11.5–15.5)
WBC: 3.9 10*3/uL — ABNORMAL LOW (ref 4.0–10.5)
nRBC: 0 % (ref 0.0–0.2)

## 2021-06-21 LAB — I-STAT VENOUS BLOOD GAS, ED
Acid-Base Excess: 3 mmol/L — ABNORMAL HIGH (ref 0.0–2.0)
Bicarbonate: 28.8 mmol/L — ABNORMAL HIGH (ref 20.0–28.0)
Calcium, Ion: 1.27 mmol/L (ref 1.15–1.40)
HCT: 36 % — ABNORMAL LOW (ref 39.0–52.0)
Hemoglobin: 12.2 g/dL — ABNORMAL LOW (ref 13.0–17.0)
O2 Saturation: 42 %
Patient temperature: 37
Potassium: 3.9 mmol/L (ref 3.5–5.1)
Sodium: 139 mmol/L (ref 135–145)
TCO2: 30 mmol/L (ref 22–32)
pCO2, Ven: 48.8 mmHg (ref 44.0–60.0)
pH, Ven: 7.379 (ref 7.250–7.430)
pO2, Ven: 25 mmHg — CL (ref 32.0–45.0)

## 2021-06-21 LAB — RESP PANEL BY RT-PCR (FLU A&B, COVID) ARPGX2
Influenza A by PCR: NEGATIVE
Influenza B by PCR: NEGATIVE
SARS Coronavirus 2 by RT PCR: NEGATIVE

## 2021-06-21 LAB — COMPREHENSIVE METABOLIC PANEL
ALT: 29 U/L (ref 0–44)
AST: 24 U/L (ref 15–41)
Albumin: 2.8 g/dL — ABNORMAL LOW (ref 3.5–5.0)
Alkaline Phosphatase: 124 U/L (ref 38–126)
Anion gap: 10 (ref 5–15)
BUN: 16 mg/dL (ref 8–23)
CO2: 26 mmol/L (ref 22–32)
Calcium: 9.4 mg/dL (ref 8.9–10.3)
Chloride: 102 mmol/L (ref 98–111)
Creatinine, Ser: 0.75 mg/dL (ref 0.61–1.24)
GFR, Estimated: >60 mL/min (ref >60)
Glucose, Bld: 216 mg/dL — ABNORMAL HIGH (ref 70–99)
Potassium: 3.8 mmol/L (ref 3.5–5.1)
Sodium: 138 mmol/L (ref 135–145)
Total Bilirubin: 0.6 mg/dL (ref 0.3–1.2)
Total Protein: 6.9 g/dL (ref 6.5–8.1)

## 2021-06-21 LAB — PHOSPHORUS: Phosphorus: 2.5 mg/dL (ref 2.5–4.6)

## 2021-06-21 LAB — TROPONIN I (HIGH SENSITIVITY)
Troponin I (High Sensitivity): 23 ng/L — ABNORMAL HIGH (ref <18)
Troponin I (High Sensitivity): 23 ng/L — ABNORMAL HIGH (ref <18)

## 2021-06-21 LAB — MAGNESIUM: Magnesium: 2 mg/dL (ref 1.7–2.4)

## 2021-06-21 LAB — PROCALCITONIN: Procalcitonin: <0.1 ng/mL

## 2021-06-21 LAB — BRAIN NATRIURETIC PEPTIDE: B Natriuretic Peptide: 677.4 pg/mL — ABNORMAL HIGH (ref 0.0–100.0)

## 2021-06-21 LAB — LACTIC ACID, PLASMA: Lactic Acid, Venous: 1.2 mmol/L (ref 0.5–1.9)

## 2021-06-21 LAB — STREP PNEUMONIAE URINARY ANTIGEN: Strep Pneumo Urinary Antigen: NEGATIVE

## 2021-06-21 MED ORDER — CLOPIDOGREL BISULFATE 75 MG PO TABS
75.0000 mg | ORAL_TABLET | Freq: Every day | ORAL | Status: DC
  Administered 2021-06-21 – 2021-06-23 (×3): 75 mg via ORAL
  Filled 2021-06-21 (×3): qty 1

## 2021-06-21 MED ORDER — FINASTERIDE 5 MG PO TABS
5.0000 mg | ORAL_TABLET | Freq: Every morning | ORAL | Status: DC
  Administered 2021-06-21 – 2021-06-23 (×3): 5 mg via ORAL
  Filled 2021-06-21 (×3): qty 1

## 2021-06-21 MED ORDER — METHYLPREDNISOLONE SODIUM SUCC 125 MG IJ SOLR
80.0000 mg | Freq: Two times a day (BID) | INTRAMUSCULAR | Status: DC
  Administered 2021-06-21 – 2021-06-22 (×4): 80 mg via INTRAVENOUS
  Filled 2021-06-21 (×4): qty 2

## 2021-06-21 MED ORDER — SODIUM CHLORIDE 0.9 % IV SOLN
500.0000 mg | Freq: Once | INTRAVENOUS | Status: AC
  Administered 2021-06-21: 500 mg via INTRAVENOUS
  Filled 2021-06-21: qty 5

## 2021-06-21 MED ORDER — ROSUVASTATIN CALCIUM 5 MG PO TABS
10.0000 mg | ORAL_TABLET | Freq: Every morning | ORAL | Status: DC
  Administered 2021-06-21 – 2021-06-24 (×4): 10 mg via ORAL
  Filled 2021-06-21 (×5): qty 2

## 2021-06-21 MED ORDER — SODIUM CHLORIDE 0.9 % IV SOLN
500.0000 mg | INTRAVENOUS | Status: DC
  Administered 2021-06-22 – 2021-06-23 (×3): 500 mg via INTRAVENOUS
  Filled 2021-06-21 (×5): qty 5

## 2021-06-21 MED ORDER — ALBUTEROL SULFATE (2.5 MG/3ML) 0.083% IN NEBU: 2.5000 mg | INHALATION_SOLUTION | RESPIRATORY_TRACT | Status: DC | PRN

## 2021-06-21 MED ORDER — PANTOPRAZOLE SODIUM 40 MG PO TBEC
40.0000 mg | DELAYED_RELEASE_TABLET | Freq: Every day | ORAL | Status: DC
  Administered 2021-06-21 – 2021-06-24 (×4): 40 mg via ORAL
  Filled 2021-06-21 (×4): qty 1

## 2021-06-21 MED ORDER — ACETAMINOPHEN 650 MG RE SUPP: 650.0000 mg | Freq: Four times a day (QID) | RECTAL | Status: DC | PRN

## 2021-06-21 MED ORDER — ACETAMINOPHEN 325 MG PO TABS: 650.0000 mg | ORAL_TABLET | Freq: Four times a day (QID) | ORAL | Status: DC | PRN

## 2021-06-21 MED ORDER — SODIUM CHLORIDE 0.9 % IV SOLN
1.0000 g | INTRAVENOUS | Status: DC
  Administered 2021-06-21 – 2021-06-23 (×3): 1 g via INTRAVENOUS
  Filled 2021-06-21 (×3): qty 10

## 2021-06-21 MED ORDER — SODIUM CHLORIDE 0.9 % IV SOLN
1.0000 g | Freq: Once | INTRAVENOUS | Status: AC
  Administered 2021-06-21: 1 g via INTRAVENOUS
  Filled 2021-06-21: qty 10

## 2021-06-21 MED ORDER — FUROSEMIDE 10 MG/ML IJ SOLN
20.0000 mg | Freq: Once | INTRAMUSCULAR | Status: AC
  Administered 2021-06-21: 20 mg via INTRAVENOUS
  Filled 2021-06-21: qty 2

## 2021-06-21 MED ORDER — IOHEXOL 350 MG/ML SOLN
80.0000 mL | Freq: Once | INTRAVENOUS | Status: AC | PRN
  Administered 2021-06-21: 80 mL via INTRAVENOUS

## 2021-06-21 MED ORDER — DIVALPROEX SODIUM 125 MG PO DR TAB
125.0000 mg | DELAYED_RELEASE_TABLET | Freq: Every day | ORAL | Status: DC
  Administered 2021-06-22 – 2021-06-23 (×3): 125 mg via ORAL
  Filled 2021-06-21 (×6): qty 1

## 2021-06-21 MED ORDER — IPRATROPIUM-ALBUTEROL 0.5-2.5 (3) MG/3ML IN SOLN
3.0000 mL | Freq: Four times a day (QID) | RESPIRATORY_TRACT | Status: DC
  Administered 2021-06-21 – 2021-06-22 (×7): 3 mL via RESPIRATORY_TRACT
  Filled 2021-06-21 (×7): qty 3

## 2021-06-21 NOTE — ED Notes (Signed)
Patient transported to CT 

## 2021-06-21 NOTE — H&P (Signed)
History and Physical    PLEASE NOTE THAT DRAGON DICTATION SOFTWARE WAS USED IN THE CONSTRUCTION OF THIS NOTE.   Todd Dunn YSA:[id number] DOB: May 08, 1941 DOA: 06/20/2021  PCP: Fredrich Romans, [place]  Patient coming from: home   I have personally briefly reviewed patient's old medical records in [place]  Chief Complaint: Shortness of breath  HPI: Todd Dunn is a 81 y.o. 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   The patient reports 2 days progressive shortness of breath associated with new onset nonproductive cough, subjective fever, in the absence of any associated chills, full body rigors, or generalized myalgias.  Denies any associated orthopnea, PND, or new onset peripheral edema. No recent chest pain, diaphoresis, palpitations, N/V, pre-syncope, or syncope.  Not associate with any wheezing, mopped assist, new lower extremity erythema, or calf tenderness. No recent headache, neck stiffness, rhinitis, rhinorrhea, sore throat, abdominal pain, diarrhea, or rash.  In the setting of his progressive shortness of breath, the patient conveys that he borrowed a family member's albuterol inhaler, and used this twice over the course of the last day, noting some improvement following use of the albuterol inhaler.   While the patient denies any known formal diagnosis of underlying COPD, he acknowledges a long smoking history He smoked greater than 1 pack/day for at least 20 to 25 years, before subsequently discontinuing, without ensuing resumption of his smoking habits.  Denies any known baseline supplemental oxygen requirements.     ED Course:  Vital signs in the ED were notable for  the following: Afebrile; heart rate 74-84; blood pressure 117/65 - 151/81 mmHg; respiratory rate 17-25, oxygen saturation initially noted to be 85% on room air, with ensuing improvement to 97 to 99% on 2 L nasal cannula.  Labs were notable for the following: BMP notable for the following: Sodium 139, bicarbonate 25, creatinine 0.77.  High-sensitivity troponin I 23, with repeat value unchanged at 23.  BNP 670, without any prior BNP data point available for point comparison.  CBC notable for white blood cell count 3900.  COVID-19/influenza PCR found to be negative propyl cultures x2 collected prior to initiation of IV antibiotics.  Imaging and additional notable ED work-up: CTA chest showed no evidence of acute pulmonary embolism but did show evidence of left upper lobe infiltrate concerning for pneumonia in the absence of any evidence of edema, pleural effusion, or pneumothorax.  While in the ED, the following were administered: Azithromycin, Rocephin, Lasix 20 mg IV x1 dose.  Subsequently, the patient was admitted to the med telemetry unit for further evaluation management of severe sepsis due to community-acquired pneumonia, complicated by acute hypoxic respiratory distress and potential element of acute COPD exacerbation.     Review of Systems: As per HPI otherwise 10 point review of systems negative.   Past Medical History:  Diagnosis Date   BPH (benign prostatic hyperplasia)    CAD (coronary artery disease)    Chronic indwelling Foley catheter    CVA (cerebral vascular accident) ([place])    HTN (hypertension)    Neurogenic bladder    SSS (sick sinus syndrome) ([place])    s/p MDT ICD   Vitamin D deficiency     Past Surgical History:  Procedure Laterality Date   ABDOMINAL  AORTOGRAM W/LOWER EXTREMITY N/A 03/23/2021   Procedure: ABDOMINAL AORTOGRAM W/LOWER EXTREMITY;  Surgeon: Waynetta Sandy, MD;  Location: [facility];  Service: Cardiovascular;  Laterality: N/A;    AMPUTATION Right 04/01/2021   Procedure: RIGHT GREAT TOE AMPUTATION;  Surgeon: Newt Minion, MD;  Location: [facility];  Service: Orthopedics;  Laterality: Right;   APPENDECTOMY     APPLICATION OF WOUND VAC Right 04/01/2021   Procedure: APPLICATION OF WOUND VAC;  Surgeon: Newt Minion, MD;  Location: [facility];  Service: Orthopedics;  Laterality: Right;   CARDIAC SURGERY     FEMORAL-TIBIAL BYPASS GRAFT Right 03/26/2021   Procedure: BYPASS GRAFT FEMORAL-TIBIAL ARTERY.  [place] right leg saphenous vein, right vein patch angioplasty posterior tibial.;  Surgeon: Marty Heck, MD;  Location: [facility];  Service: Vascular;  Laterality: Right;   PACEMAKER PLACEMENT      Social History:  reports that he has quit smoking. His smoking use included cigarettes. He has never used smokeless tobacco. He reports that he does not currently use alcohol. He reports that he does not use drugs.   Allergies  Allergen Reactions   Food Anaphylaxis and Rash    Clams   Aspirin Itching   [place] Clam (M. Mercenaria) Skin Test Itching    Family History  Family history unknown: Yes    Family history reviewed and not pertinent    Prior to Admission medications   Medication Sig Start Date End Date Taking? Authorizing Provider  acetaminophen (TYLENOL) 325 MG tablet Take 650 mg by mouth every 4 (four) hours as needed for mild pain.    [provider]  clopidogrel (PLAVIX) 75 MG tablet Take 75 mg by mouth at bedtime.    [provider]  divalproex (DEPAKOTE) 125 MG DR tablet Take 125 mg by mouth at bedtime.    [provider]  finasteride (PROSCAR) 5 MG tablet Take 5 mg by mouth every morning.    [provider]  HYDROcodone-acetaminophen (NORCO/VICODIN) 5-325 MG tablet Take 1-2 tablets by mouth every 4 (four) hours as needed for moderate pain (pain score 4-6). 04/05/21   Shawna Clamp, MD  nitroGLYCERIN (NITROSTAT) 0.4 MG SL tablet Place 0.4 mg under the tongue every 5  (five) minutes as needed for chest pain.    [provider]  pantoprazole (PROTONIX) 40 MG tablet Take 1 tablet (40 mg total) by mouth daily. 04/06/21   Shawna Clamp, MD  polyethylene glycol (MIRALAX / GLYCOLAX) 17 g packet Take 17 g by mouth daily as needed for mild constipation. 05/14/21   Merrily Brittle, DO  rosuvastatin (CRESTOR) 10 MG tablet Take 10 mg by mouth every morning.    [provider]  vitamin B-12 (CYANOCOBALAMIN) 1000 MCG tablet Take 1 tablet (1,000 mcg total) by mouth daily. 01/08/21   Lincoln Brigham, PA-C  Vitamin D, Ergocalciferol, (DRISDOL) 1.25 MG (50000 UNIT) CAPS capsule Take 50,000 Units by mouth every Monday.    [provider]     Objective    Physical Exam: Vitals:   06/20/21 2315 06/20/21 2345 06/21/21 0000 06/21/21 0015  BP: (!) 151/81 (!) 144/59 117/63 (!) 107/57  Pulse: 73 (!) 32 (!) 36   Resp: (!) 22 (!) 25 (!) 21 (!) 21  Temp:      TempSrc:      SpO2: 100% 100% 100% 99%    General: appears to be stated age; alert, oriented; mildly increased work of breathing noted Skin: warm, dry, no rash Head:  AT/[place] Mouth:  Oral mucosa membranes appear moist, normal dentition Neck: supple; trachea midline Heart:  RRR; did not appreciate any M/R/G Lungs: CTAB, did not appreciate any wheezes, rales, or rhonchi Abdomen: + BS; soft, ND, NT Vascular: 2+ pedal pulses b/l; 2+ radial pulses b/l Extremities: no peripheral edema, no muscle wasting Neuro: strength and sensation intact in upper and lower extremities b/l   Labs on Admission: I have personally reviewed following labs and imaging studies  CBC: Recent Labs  Lab 06/20/21 2104  WBC 3.9*  NEUTROABS 2.3  HGB 10.7*  HCT 36.9*  MCV 81.6  PLT 478   Basic Metabolic Panel: Recent Labs  Lab 06/20/21 2104  NA 139  K 3.7  CL 106  CO2 25  GLUCOSE 120*  BUN 16  CREATININE 0.77  CALCIUM 9.8   GFR: CrCl cannot be calculated (Unknown ideal weight.). Liver Function Tests: No  results for input(s): AST, ALT, ALKPHOS, BILITOT, PROT, ALBUMIN in the last 168 hours. No results for input(s): LIPASE, AMYLASE in the last 168 hours. No results for input(s): AMMONIA in the last 168 hours. Coagulation Profile: No results for input(s): INR, PROTIME in the last 168 hours. Cardiac Enzymes: No results for input(s): CKTOTAL, CKMB, CKMBINDEX, TROPONINI in the last 168 hours. BNP (last 3 results) No results for input(s): PROBNP in the last 8760 hours. HbA1C: No results for input(s): HGBA1C in the last 72 hours. CBG: No results for input(s): GLUCAP in the last 168 hours. Lipid Profile: No results for input(s): CHOL, HDL, LDLCALC, TRIG, CHOLHDL, LDLDIRECT in the last 72 hours. Thyroid Function Tests: No results for input(s): TSH, T4TOTAL, FREET4, T3FREE, THYROIDAB in the last 72 hours. Anemia Panel: No results for input(s): VITAMINB12, FOLATE, FERRITIN, TIBC, IRON, RETICCTPCT in the last 72 hours. Urine analysis:    Component Value Date/Time   COLORURINE AMBER (A) 06/06/2021 0842   APPEARANCEUR CLOUDY (A) 06/06/2021 0842   LABSPEC 1.018 06/06/2021 0842   PHURINE 5.0 06/06/2021 0842   GLUCOSEU NEGATIVE 06/06/2021 0842   HGBUR MODERATE (A) 06/06/2021 0842   BILIRUBINUR NEGATIVE 06/06/2021 [place] 06/06/2021 0842   PROTEINUR 100 (A) 06/06/2021 0842   NITRITE POSITIVE (A) 06/06/2021 0842   LEUKOCYTESUR MODERATE (A) 06/06/2021 0842    Radiological Exams on Admission: DG Chest 2 View  Result Date: 06/20/2021 CLINICAL DATA:  Altered mental status with worsening shortness of breath. EXAM: CHEST - 2 VIEW COMPARISON:  May 28, 2021 FINDINGS: There is a dual lead AICD with stable lead wire positioning. Multiple sternal wires and vascular clips are noted. Very mild, bilateral infrahilar atelectasis is seen. There is no evidence of acute infiltrate, pleural effusion or pneumothorax. The heart size and mediastinal contours are within normal limits. Moderate  severity calcification of the aortic arch is noted. Multilevel degenerative changes seen throughout the thoracic spine. IMPRESSION: 1. Evidence of prior median sternotomy/CABG. 2. Very mild, bilateral infrahilar atelectasis. Electronically Signed   By: Virgina Norfolk M.D.   On: 06/20/2021 21:46   CT Angio Chest PE W and/or Wo Contrast  Result Date: 06/21/2021 CLINICAL DATA:  Increasing shortness of breath today EXAM: CT ANGIOGRAPHY CHEST WITH CONTRAST TECHNIQUE: Multidetector CT imaging of the chest was performed using the standard protocol during bolus administration of intravenous contrast. Multiplanar CT image reconstructions and MIPs were obtained to evaluate the vascular anatomy. RADIATION DOSE REDUCTION: This exam was performed according to the departmental dose-optimization program which includes automated exposure control, adjustment of the mA and/or kV according to  patient size and/or use of iterative reconstruction technique. CONTRAST:  64m OMNIPAQUE IOHEXOL 350 MG/ML SOLN COMPARISON:  Plain film from the previous day. FINDINGS: Cardiovascular: Atherosclerotic calcifications of the thoracic aorta are noted. No aneurysmal dilatation is seen. Heart is mildly enlarged in size. Coronary calcifications are noted. The pulmonary artery shows a normal branching pattern. No intraluminal filling defect to suggest pulmonary embolism is noted. Defibrillator is again noted. Mediastinum/Nodes: Thoracic inlet is within normal limits. No sizable hilar or mediastinal adenopathy is noted. The esophagus as visualized is within normal limits. Lungs/Pleura: Lungs are well aerated bilaterally. Patchy infiltrate is noted in the left upper lobe consistent with acute pneumonia. Mild emphysematous changes are seen. No sizable parenchymal nodule is noted. No sizable effusion is seen. Upper Abdomen: Visualized upper abdomen is within normal limits. Musculoskeletal: Degenerative changes of the thoracic spine are noted. Review  of the MIP images confirms the above findings. IMPRESSION: No evidence of pulmonary emboli. Patchy left upper lobe infiltrate consistent with acute pneumonia. Aortic Atherosclerosis (ICD10-I70.0) and Emphysema (ICD10-J43.9). Electronically Signed   By: MInez CatalinaM.D.   On: 06/21/2021 00:53      Assessment/Plan   Principal Problem:   CAP (community acquired pneumonia) Active Problems:   Hyperlipidemia   BPH (benign prostatic hyperplasia)   Chronic systolic heart failure (HCC)   Severe sepsis (HCC)   SOB (shortness of breath)   GERD (gastroesophageal reflux disease)     #) Severe sepsis due to community-acquired pneumonia: Diagnosis on the basis of 2 days of progressive shortness of breath associated with new onset nonproductive cough, subjective fever, with CTA chest showing evidence of left upper lobe infiltrate concerning for pneumonia. SIRS criteria met via presenting leukopenia as well as tachypnea. Will check STAT LA. Of note, given the associated presence of suspected end organ damage in the form of concominant presenting acute hypoxic respiratory distress, criteria are met for pt's sepsis to be considered severe in nature. However, in the absence of lactic acid level that is greater than or equal to 4.0, and in the absence of any associated hypotension refractory to IVF's, there are no indications for administration of a 30 mL/kg IVF bolus at this time. Additional ED work-up/management notable for: Negative COVID-19/influenza PCR.  Of note, blood cultures x2 collected prior to initiation of azithromycin and Rocephin.   Plan: CBC w/ diff in AM.  Follow for results of blood cx's x 2. Abx: Continue Rocephin and azithromycin.  Check stat lactic acid level.  Add on procalcitonin.  Flutter valve, incentive spirometry.  Check strep pneumoniae urine antigen.       #) Acute hypoxic respiratory distress: in the context of acute respiratory symptoms and no known baseline supplemental O2  requirements, presenting O2 sat in the mid 80s, subsequently improving into the mid to high 90s on 2 L nasal cannula, thereby meeting criteria for acute hypoxic respiratory distress as opposed to acute hypoxic respiratory failure at this time. Appears to be on basis of severe sepsis due to left upper lobe pneumonia, as above.  While the patient has no formal diagnosis of underlying COPD, suspect that he may have an element of COPD given his prior history of smoking, particularly given some improvement in his breathing following use of family members albuterol inhaler at home.  Consequently, will treat as if presentation is associated with acute COPD exacerbation, including scheduled duo nebs, steroids, as further noted below.  In terms of other considered etiologies, ACS appears less likely at this time in  the absence of any recent CP and in the context of no significant troponin elevation, which is particularly reassuring given that the patient shortness of breath has been occurring for 2 days, providing ample time for significant elevation if ACS was associated with presenting pathology. No clinical or radiographic evidence to suggest acutely decompensated heart failure at this time, including presenting CTA showing no evidence of interstitial edema, pulmonary edema, or pleural effusion, will also showing no evidence of acute pulmonary embolism nor any evidence of pneumothorax. Furthermore, COVID-19/Influenza PCR were negative when checked today.   Plan: further evaluation/management of presenting severe sepsis due to left upper lobe pneumonia, as above. Monitor continuous pulse ox with prn supplemental O2 to maintain O2 sats greater than or equal to 92%. monitor on telemetry. CMP/CBC in the AM. Check serum Mg and Phos levels. Check blood gas. Flutter valve, incentive spirometry.  Scheduled duo nebulizer treatments, prn albuterol nebulizer, solumedrol.  Add on procalcitonin.  Repeat troponin in the  morning.      #) Chronic combined systolic and diastolic heart failure: documented history of such, with most recent echocardiogram performed in October 2022 demonstrating evidence of LVEF 30 to 35%, left ventricular global hypokinesis, mildly dilated left ventricle, and evidence of grade 1 diastolic dysfunction.  No clinical or radiographic evidence to suggest acutely decompensated heart failure at this time, including for results of her presenting CTA chest, as further detailed above. Patient's home diuretic regimen reportedly consists of the following: None.  Interestingly, the patient does not appear to be on any beta-blocker or ACE inhibitor as an outpatient.    Plan: monitor strict I's & O's and daily weights. Repeat BMP in the morning. Check serum magnesium level.  May ultimately consider initiation of beta-blocker and ACE inhibitor/ARB, as above.      #) Benign Prostatic Hyperplasia:  documented h/o such; on finasteride as outpatient.   Plan: monitor strict I's & O's and daily weights. Repeat BMP in AM.  Continue outpatient finasteride.        #) GERD: documented h/o such; on Protonix as outpatient.   Plan: continue home PPI.         #) Hyperlipidemia: documented h/o such. On rosuvastatin as outpatient.    Plan: continue home statin.       DVT prophylaxis: SCD's   Code Status: Full code Family Communication: none Disposition Plan: Per Rounding Team Consults called: none;  Admission status: Inpatient; med telemetry  Warrants inpatient status on basis of need for further evaluation and management of severe sepsis due to [employer] pneumonia, including provision of IV antibiotics as well as further evaluation management of suspected corresponding acute hypoxic respiratory distress, including close monitoring of ensuing supplemental oxygen demands in the context of no known baseline supplemental requirements as well as further evaluation and management of  suspected contribution from acute COPD exacerbation, including systemic corticosteroids and scheduled/as needed breathing treatments, as above.   PLEASE NOTE THAT DRAGON DICTATION SOFTWARE WAS USED IN THE CONSTRUCTION OF THIS NOTE.   [place] DO [facility]  From [place]   06/21/2021, 1:24 AM

## 2021-06-21 NOTE — Progress Notes (Signed)
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  History is unable to be obtained due to the patient's current condition and his baseline dysarthria and dysphagia but is reported that he had 2 days of shortness of breath associated new onset cough and subjective fevers.  He does have a long smoking history and smoked greater than a pack per day for least 20 to 25 years.  In the ED had work-up done with a CT of the chest which showed no evidence of acute PE but did show evidence of a left upper lobe infiltrate concerning for pneumonia in the absence of any edema comfortable fusion pneumothorax.  He was admitted and given azithromycin Rocephin Lasix and then subsequently placed on an telemetry unit for management of severe sepsis due to community-acquired pneumonia  Assessment & Plan:   Principal Problem:   CAP (community acquired pneumonia) Active Problems:   Hyperlipidemia   BPH (benign prostatic hyperplasia)   Chronic systolic heart failure (HCC)   Severe sepsis (HCC)   SOB (shortness of breath)   GERD (gastroesophageal reflux disease)  Severe sepsis in the setting of community-acquired pneumonia, poA -Has had progressive 2 days of progressive worsening shortness of breath new onset cough with subjective fevers CTA showing evidence of left upper lobe infiltrate concerning for pneumonia -We will get SLP evaluation -Met SIRS criteria on admission with leukopenia and tachypnea.  -Did not receive the 30 mL/kg IV fluid bolus -Blood cultures x2  strep pneumo urinary antigen was negative -Getting IV Solu-Medrol 80 mg Q 12 and will continue for now and then wean in the morning -His procalcitonin level was less than 0.10 and a lactic acid was 1.2 -Continue with azithromycin and ceftriaxone -WBC is still 3.9  Acute respiratory failure with hypoxia in the setting of pneumonia as above -He does not have a baseline oxygen requirement but was requiring supplemental oxygen as he was presenting with saturations in the mid 80s on arrival with improvement to the high 90s on 2 L -Has pneumonia noted on his CT scan -Does not have a formal diagnosis of COPD but may have an element of COPD given his prior history of smoking -Continue with breathing treatments and he has an albuterol inhaler at home -His respiratory failure is less likely in the setting of ACS given that he is not having any chest pain no evidence of significant with elevated troponin -CTA as above -COVID-19 and influenza AMB negative -Continue supplemental oxygen via nasal cannula and wean O2 as tolerated -We will need an amatory home O2 screen prior to discharge and repeat chest x-ray in a.m. -Continue scheduled DuoNeb treatments as needed albuterol nebulizer and Solu-Medrol as well as antibiotics with ceftriaxone and azithromycin  Chronic combined systolic and diastolic CHF -Most recent echo showed an EF of 30 to 35% with LV global hypokinesis and mildly dilated left ventricle and evidence of grade 1 diastolic dysfunction -We will rest and suggest that he is acutely decompensated this time and a CTA of the chest shows pneumonia as above -Troponin was elevated but flat at 23 -  BNP was 677.4 -Strict I's and O's and daily weights and continue monitor and may consider initiation of beta-blocker and ACE inhibitor  Normocytic anemia -Patient's hemoglobin/hematocrit went from 10.7/36.9 -> 11.0/37.7 -> 12.2/36.0 -Check anemia panel in a.m. 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 nightly   DVT prophylaxis: SCDs; Will add Lovenox Code Status: FULL CODE Family Communication: No family present at bedside Disposition Plan: Pending further clinical improvement  Status is: Inpatient  Remains inpatient appropriate because: Will need further improvement and weaning    Consultants:  None  Procedures:   Antimicrobials:  Anti-infectives (From admission, onward)    Start     Dose/Rate Route Frequency Ordered Stop   06/21/21 2200  azithromycin (ZITHROMAX) 500 mg in sodium chloride 0.9 % 250 mL IVPB        500 mg 250 mL/hr over 60 Minutes Intravenous Every 24 hours 06/21/21 0126     06/21/21 2200  cefTRIAXone (ROCEPHIN) 1 g in sodium chloride 0.9 % 100 mL IVPB        1 g 200 mL/hr over 30 Minutes Intravenous Every 24 hours 06/21/21 0126     06/21/21 0100  cefTRIAXone (ROCEPHIN) 1 g in sodium chloride 0.9 % 100 mL IVPB        1 g 200 mL/hr over 30 Minutes Intravenous  Once 06/21/21 0059 06/21/21 0206   06/21/21 0100  azithromycin (ZITHROMAX) 500 mg in sodium chloride 0.9 % 250 mL IVPB        500 mg 250 mL/hr over 60 Minutes Intravenous  Once 06/21/21 0059 06/21/21 0329        Subjective: Seen and examined at bedside and it is difficult for him to communicate given his dysarthria from his prior stroke.  Had removed his oxygen earlier and was little dyspneic and hypoxic.  No other concerns or complaints.  No family currently at bedside.  Objective: Vitals:   06/21/21 0500 06/21/21 0530 06/21/21 0645 06/21/21 0945  BP:  112/64 128/60 130/81  Pulse:  67 70 70  Resp:  _0 Temp: 98.7 F (37.1 C)     TempSrc: Oral     SpO2:  100% 100% 99%    Intake/Output Summary (Last 24 hours) at 06/21/2021 0955 Last data filed at 06/21/2021 0216 Gross per 24 hour  Intake 100 ml  Output 575 ml  Net -475 ml   There were no vitals filed for this visit.  Examination: Physical Exam:  Constitutional: Thin cachectic chronically ill-appearing African-American male currently in no acute distress appears calm Eyes: Lids and conjunctivae normal, sclerae anicteric  ENMT: External Ears, Nose appear normal. Grossly normal hearing. Mucous membranes are moist.  Neck: Appears normal, supple, no cervical masses, normal ROM, no appreciable thyromegaly; no appreciable JVD Respiratory: Diminished to auscultation bilaterally with coarse breath sounds, no wheezing, rales, rhonchi or crackles. Normal respiratory effort and patient is not tachypenic. No accessory muscle use.  Wearing supplemental oxygen via nasal cannula Cardiovascular: RRR, no murmurs / rubs / gallops. S1 and S2 auscultated. No extremity edema. Abdomen: Soft, non-tender, non-distended. Bowel sounds positive.  GU: Deferred.  Urinary catheter in place Musculoskeletal: No clubbing /  cyanosis of digits/nails. No joint deformity upper and lower extremities.  Skin: No rashes, lesions, ulcers on limited skin evaluation. No induration; Warm and dry.  Neurologic: CN 2-12 grossly intact with no focal deficits but has some dysarthria and he is difficult to understand. Romberg sign and cerebellar reflexes not assessed.  Psychiatric: Normal judgment and insight. Normal mood and appropriate affect.   Data Reviewed: I have personally reviewed following labs and imaging studies  CBC: Recent Labs  Lab 06/20/21 2104 06/21/21 0322 06/21/21 0447  WBC 3.9* 3.9*  --   NEUTROABS 2.3 2.1  --   HGB 10.7* 11.0* 12.2*  HCT 36.9* 37.7* 36.0*  MCV 81.6 82.1  --   PLT 171 178  --    Basic Metabolic Panel: Recent Labs  Lab 06/20/21 2104  06/21/21 0322 06/21/21 0447  NA 139 138 139  K 3.7 3.8 3.9  CL 106 102  --   CO2 25 26  --   GLUCOSE 120* 216*  --   BUN 16 16  --   CREATININE 0.77 0.75  --   CALCIUM 9.8 9.4  --   MG  --  2.0  --   PHOS  --  2.5  --    GFR: CrCl cannot be calculated (Unknown ideal weight.). Liver Function Tests: Recent Labs  Lab 06/21/21 0322  AST 24  ALT 29  ALKPHOS 124  BILITOT 0.6  PROT 6.9  ALBUMIN 2.8*   No results for input(s): LIPASE, AMYLASE in the last 168 hours. No results for input(s): AMMONIA in the last 168 hours. Coagulation Profile: No results for input(s): INR, PROTIME in the last 168 hours. Cardiac Enzymes: No results for input(s): CKTOTAL, CKMB, CKMBINDEX, TROPONINI in the last 168 hours. BNP (last 3 results) No results for input(s): PROBNP in the last 8760 hours. HbA1C: No results for input(s): HGBA1C in the last 72 hours. CBG: No results for input(s): GLUCAP in the last 168 hours. Lipid Profile: No results for input(s): CHOL, HDL, LDLCALC, TRIG, CHOLHDL, LDLDIRECT in the last 72 hours. Thyroid Function Tests: No results for input(s): TSH, T4TOTAL, FREET4, T3FREE, THYROIDAB in the last 72 hours. Anemia Panel: No results for input(s): VITAMINB12, FOLATE, FERRITIN, TIBC, IRON, RETICCTPCT in the last 72 hours. Sepsis Labs: Recent Labs  Lab 06/21/21 0446 06/21/21 0459  PROCALCITON  --  <0.10  LATICACIDVEN 1.2  --     Recent Results (from the past 240 hour(s))  Resp Panel by RT-PCR (Flu A&B, Covid) Nasopharyngeal Swab     Status: None   Collection Time: 06/20/21 10:12 PM   Specimen: Nasopharyngeal Swab; Nasopharyngeal(NP) swabs in vial transport medium  Result Value Ref Range Status   SARS Coronavirus 2 by RT PCR NEGATIVE NEGATIVE Final    Comment: (NOTE) SARS-CoV-2 target nucleic acids are NOT DETECTED.  The SARS-CoV-2 RNA is generally detectable in upper respiratory specimens during the acute phase of infection. The lowest concentration of SARS-CoV-2  viral copies this assay can detect is 138 copies/mL. A negative result does not preclude SARS-Cov-2 infection and should not be used as the sole basis for treatment or other patient management decisions. A negative result may occur with  improper specimen collection/handling, submission of specimen other than nasopharyngeal swab, presence of viral mutation(s) within the areas targeted by this assay, and inadequate number of viral copies(<138 copies/mL). A negative result must be combined with clinical observations, patient history, and epidemiological information. The expected result is Negative.  Fact Sheet for Patients:  EntrepreneurPulse.com.au  Sheet for Healthcare Providers:  °https://www.fda.gov/media/152162/download ° °This test is no t yet approved or cleared by the [place] FDA and  °has been authorized for detection and/or diagnosis of SARS-CoV-2 by °FDA under an Emergency Use Authorization (EUA). This EUA will remain  °in effect (meaning this test can be used) for the duration of the °COVID-19 declaration under Section 564(b)(1) of the Act, 21 °U.S.C.section 360bbb-3(b)(1), unless the authorization is terminated  °or revoked sooner.  ° ° °  ° Influenza A by PCR NEGATIVE NEGATIVE Final  ° Influenza B by PCR NEGATIVE NEGATIVE Final  °  Comment: (NOTE) °The Xpert Xpress SARS-CoV-2/FLU/RSV plus assay is intended as an aid °in the diagnosis of influenza from Nasopharyngeal swab specimens and °should not be used as a sole basis for treatment. Nasal washings and °aspirates are unacceptable for Xpert Xpress SARS-CoV-2/FLU/RSV °testing. ° °Fact Sheet for Patients: °https://www.fda.gov/media/152166/download ° °Fact Sheet for Healthcare Providers: °https://www.fda.gov/media/152162/download ° °This test is not yet approved or cleared by the [place] FDA and °has been authorized for detection and/or diagnosis of SARS-CoV-2 by °FDA under an Emergency Use Authorization  (EUA). This EUA will remain °in effect (meaning this test can be used) for the duration of the °COVID-19 declaration under Section 564(b)(1) of the Act, 21 U.S.C. °section 360bbb-3(b)(1), unless the authorization is terminated or °revoked. ° °Performed at [facility], [address] [place], [place] °[address] °  °  °RN Pressure Injury Documentation: °  °  °Estimated body mass index is 17.33 kg/m² as calculated from the following: °  Height as of 05/28/21: 6' 2" (1.88 m). °  Weight as of 05/28/21: 61.2 kg. ° °Malnutrition Type: °  °Malnutrition Characteristics: °  °Nutrition Interventions: °  °Radiology Studies: °DG Chest 2 View ° °Result Date: 06/20/2021 °CLINICAL DATA:  Altered mental status with worsening shortness of breath. EXAM: CHEST - 2 VIEW COMPARISON:  May 28, 2021 FINDINGS: There is a dual lead AICD with stable lead wire positioning. Multiple sternal wires and vascular clips are noted. Very mild, bilateral infrahilar atelectasis is seen. There is no evidence of acute infiltrate, pleural effusion or pneumothorax. The heart size and mediastinal contours are within normal limits. Moderate severity calcification of the aortic arch is noted. Multilevel degenerative changes seen throughout the thoracic spine. IMPRESSION: 1. Evidence of prior median sternotomy/CABG. 2. Very mild, bilateral infrahilar atelectasis. Electronically Signed   By: Thaddeus  Houston M.D.   On: 06/20/2021 21:46  ° °CT Angio Chest PE W and/or Wo Contrast ° °Result Date: 06/21/2021 °CLINICAL DATA:  Increasing shortness of breath today EXAM: CT ANGIOGRAPHY CHEST WITH CONTRAST TECHNIQUE: Multidetector CT imaging of the chest was performed using the standard protocol during bolus administration of intravenous contrast. Multiplanar CT image reconstructions and MIPs were obtained to evaluate the vascular anatomy. RADIATION DOSE REDUCTION: This exam was performed according to the departmental dose-optimization program which includes  automated exposure control, adjustment of the mA and/or kV according to patient size and/or use of iterative reconstruction technique. CONTRAST:  80mL OMNIPAQUE IOHEXOL 350 MG/ML SOLN COMPARISON:  Plain film from the previous day. FINDINGS: Cardiovascular: Atherosclerotic calcifications of the thoracic aorta are noted. No aneurysmal dilatation is seen. Heart is mildly enlarged in size. Coronary calcifications are noted. The pulmonary artery shows a normal branching pattern. No intraluminal filling defect to suggest pulmonary embolism is noted. Defibrillator is again noted. Mediastinum/Nodes: Thoracic inlet is within normal limits. No sizable hilar or mediastinal adenopathy is noted. The esophagus as visualized is within   normal limits. Lungs/Pleura: Lungs are well aerated bilaterally. Patchy infiltrate is noted in the left upper lobe consistent with acute pneumonia. Mild emphysematous changes are seen. No sizable parenchymal nodule is noted. No sizable effusion is seen. Upper Abdomen: Visualized upper abdomen is within normal limits. Musculoskeletal: Degenerative changes of the thoracic spine are noted. Review of the MIP images confirms the above findings. IMPRESSION: No evidence of pulmonary emboli. Patchy left upper lobe infiltrate consistent with acute pneumonia. Aortic Atherosclerosis (ICD10-I70.0) and Emphysema (ICD10-J43.9). Electronically Signed   By: Mark  Lukens M.D.   On: 06/21/2021 00:53   ° °Scheduled Meds: ° clopidogrel  75 mg Oral QHS  ° divalproex  125 mg Oral QHS  ° finasteride  5 mg Oral q morning  ° ipratropium-albuterol  3 mL Nebulization Q6H  ° methylPREDNISolone (SOLU-MEDROL) injection  80 mg Intravenous BID  ° pantoprazole  40 mg Oral Daily  ° rosuvastatin  10 mg Oral q morning  ° °Continuous Infusions: ° azithromycin    ° cefTRIAXone (ROCEPHIN)  IV    ° ° LOS: 0 days  ° ° Latif , DO °[facility] °PAGER is on AMION ° °If 7PM-7AM, please contact  night-coverage [email or web address] ° ° °

## 2021-06-21 NOTE — ED Notes (Signed)
Dr. Velia Meyer notified of PaO2 results. No orders or changes in care at this time.

## 2021-06-22 ENCOUNTER — Inpatient Hospital Stay (HOSPITAL_COMMUNITY): Payer: Medicare Other

## 2021-06-22 LAB — COMPREHENSIVE METABOLIC PANEL
ALT: 31 U/L (ref 0–44)
AST: 28 U/L (ref 15–41)
Albumin: 2.6 g/dL — ABNORMAL LOW (ref 3.5–5.0)
Alkaline Phosphatase: 109 U/L (ref 38–126)
Anion gap: 9 (ref 5–15)
BUN: 20 mg/dL (ref 8–23)
CO2: 25 mmol/L (ref 22–32)
Calcium: 9.8 mg/dL (ref 8.9–10.3)
Chloride: 105 mmol/L (ref 98–111)
Creatinine, Ser: 0.79 mg/dL (ref 0.61–1.24)
GFR, Estimated: >60 mL/min (ref >60)
Glucose, Bld: 172 mg/dL — ABNORMAL HIGH (ref 70–99)
Potassium: 3.9 mmol/L (ref 3.5–5.1)
Sodium: 139 mmol/L (ref 135–145)
Total Bilirubin: 0.3 mg/dL (ref 0.3–1.2)
Total Protein: 6.6 g/dL (ref 6.5–8.1)

## 2021-06-22 LAB — CBC WITH DIFFERENTIAL/PLATELET
Abs Immature Granulocytes: 0.01 10*3/uL (ref 0.00–0.07)
Basophils Absolute: 0 10*3/uL (ref 0.0–0.1)
Basophils Relative: 0 %
Eosinophils Absolute: 0 10*3/uL (ref 0.0–0.5)
Eosinophils Relative: 0 %
HCT: 34.1 % — ABNORMAL LOW (ref 39.0–52.0)
Hemoglobin: 10.2 g/dL — ABNORMAL LOW (ref 13.0–17.0)
Immature Granulocytes: 0 %
Lymphocytes Relative: 13 %
Lymphs Abs: 0.7 10*3/uL (ref 0.7–4.0)
MCH: 24.3 pg — ABNORMAL LOW (ref 26.0–34.0)
MCHC: 29.9 g/dL — ABNORMAL LOW (ref 30.0–36.0)
MCV: 81.4 fL (ref 80.0–100.0)
Monocytes Absolute: 0.1 10*3/uL (ref 0.1–1.0)
Monocytes Relative: 3 %
Neutro Abs: 4.7 10*3/uL (ref 1.7–7.7)
Neutrophils Relative %: 84 %
Platelets: 181 10*3/uL (ref 150–400)
RBC: 4.19 MIL/uL — ABNORMAL LOW (ref 4.22–5.81)
RDW: 21.8 % — ABNORMAL HIGH (ref 11.5–15.5)
WBC: 5.6 10*3/uL (ref 4.0–10.5)
nRBC: 0 % (ref 0.0–0.2)

## 2021-06-22 LAB — URINALYSIS, ROUTINE W REFLEX MICROSCOPIC
Bilirubin Urine: NEGATIVE
Glucose, UA: 100 mg/dL — AB
Hgb urine dipstick: NEGATIVE
Ketones, ur: NEGATIVE mg/dL
Nitrite: POSITIVE — AB
Protein, ur: NEGATIVE mg/dL
Specific Gravity, Urine: >1.03 — ABNORMAL HIGH (ref 1.005–1.030)
pH: 5.5 (ref 5.0–8.0)

## 2021-06-22 LAB — URINALYSIS, MICROSCOPIC (REFLEX)

## 2021-06-22 LAB — MAGNESIUM: Magnesium: 2.1 mg/dL (ref 1.7–2.4)

## 2021-06-22 LAB — PHOSPHORUS: Phosphorus: 3.2 mg/dL (ref 2.5–4.6)

## 2021-06-22 MED ORDER — CHLORHEXIDINE GLUCONATE CLOTH 2 % EX PADS
6.0000 | MEDICATED_PAD | Freq: Every day | CUTANEOUS | Status: DC
  Administered 2021-06-23 – 2021-06-24 (×2): 6 via TOPICAL

## 2021-06-22 MED ORDER — IPRATROPIUM-ALBUTEROL 0.5-2.5 (3) MG/3ML IN SOLN
3.0000 mL | Freq: Three times a day (TID) | RESPIRATORY_TRACT | Status: DC
  Administered 2021-06-23: 3 mL via RESPIRATORY_TRACT
  Filled 2021-06-22: qty 3

## 2021-06-22 MED ORDER — METHYLPREDNISOLONE SODIUM SUCC 125 MG IJ SOLR
120.0000 mg | INTRAMUSCULAR | Status: DC
  Administered 2021-06-23 – 2021-06-24 (×2): 120 mg via INTRAVENOUS
  Filled 2021-06-22 (×2): qty 2

## 2021-06-22 NOTE — Progress Notes (Signed)
°  Transition of Care (TOC) Screening Note   Patient Details  Name: Todd Dunn Date of Birth: September 25, 1940   Transition of Care [facility]) CM/SW Contact:    Benard Halsted, LCSW Phone Number: 06/22/2021, 4:32 PM    Transition of Care Department [facility]) has reviewed patient and no TOC needs have been identified at this time. We will continue to monitor patient advancement through interdisciplinary progression rounds. If new patient transition needs arise, please place a TOC consult.

## 2021-06-22 NOTE — ED Notes (Signed)
Pt has pulled all vital sign equipment off multiple times unable to update vitals on time.

## 2021-06-22 NOTE — ED Notes (Signed)
Breakfast orders placed 

## 2021-06-22 NOTE — Progress Notes (Signed)
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  History is unable to be obtained due to the patient's current condition and his baseline dysarthria and dysphagia but is reported that he had 2 days of shortness of breath associated new onset cough and subjective fevers.  He does have a long smoking history and smoked greater than a pack per day for least 20 to 25 years.  In the ED had work-up done with a CT of the chest which showed no evidence of acute PE but did show evidence of a left upper lobe infiltrate concerning for pneumonia in the absence of any edema comfortable fusion pneumothorax.  He was admitted and given azithromycin Rocephin Lasix and then subsequently placed on an telemetry unit for management of severe sepsis due to community-acquired pneumonia  Assessment & Plan:   Principal Problem:   CAP (community acquired pneumonia) Active Problems:   Hyperlipidemia   BPH (benign prostatic hyperplasia)   Chronic systolic heart failure (HCC)   Severe sepsis (HCC)   SOB (shortness of breath)   GERD (gastroesophageal reflux disease)  Severe sepsis in the setting of community-acquired pneumonia, poA, improving  -Has had progressive 2 days of progressive worsening shortness of breath new onset cough with subjective fevers CTA showing evidence of left upper lobe infiltrate concerning for pneumonia -We will get SLP evaluation and pending  -Met SIRS criteria on admission with leukopenia and tachypnea.  -Did not receive the 30 mL/kg IV fluid  bolus -C/w DuoNeb 3 mL Neb q6h and Albuterol 2.5 mg Neb q4hprn Wheezing and SOB -Blood cultures x2 strep pneumo urinary antigen was negative -Getting IV Solu-Medrol 80 mg Q 12 and will wean to 60 mg q12h -His procalcitonin level was less than 0.10 and a lactic acid was 1.2 -Continue with azithromycin and ceftriaxone for now -WBC was 3.9 and improved to 5.6 -Repeat CXR in the AM   Acute respiratory failure with hypoxia in the setting of pneumonia as above -He does not have a baseline oxygen requirement but was requiring supplemental oxygen as he was presenting with saturations in the mid 80s on arrival with improvement to the high 90s on 2 L -Has pneumonia noted on his CT scan -Does not have a formal diagnosis of COPD but may have an element of COPD given his prior history of smoking -Continue with breathing treatments and he has an albuterol inhaler at home -SpO2: 97 % O2 Flow Rate (L/min): 2 L/min; Now off of O2 via [place]  -His respiratory failure is less likely in the setting of ACS given that he is not having any chest pain no evidence of significant with elevated troponin -CTA as above -COVID-19 and influenza AMB negative -Continue supplemental oxygen via nasal cannula and wean O2 as tolerated -We will need an amatory home O2 screen prior to discharge and repeat chest x-ray in a.m. -Continue scheduled DuoNeb treatments as needed albuterol nebulizer and Solu-Medrol as well as antibiotics with ceftriaxone and azithromycin; Anticipating D/C in the next 24-48 hours   Chronic combined systolic and diastolic CHF -Most recent echo showed an  EF of 30 to 35% with LV global hypokinesis and mildly dilated left ventricle and evidence of grade 1 diastolic dysfunction -We will rest and suggest that he is acutely decompensated this time and a CTA of the chest shows pneumonia as above -Troponin was elevated but flat at 23 -BNP was 677.4 -Strict I's and O's and daily weights and continue monitor and may  consider initiation of beta-blocker and ACE inhibitor  Normocytic anemia -Patient's hemoglobin/hematocrit went from 10.7/36.9 -> 11.0/37.7 -> 12.2/36.0 -> 10.2/34.1 -Check anemia panel in a.m. 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 nightly  DVT prophylaxis: SCDs; Will add Lovenox Code Status: FULL CODE Family Communication: No family present at bedside Disposition Plan: Pending further clinical improvement and evaluation by PT/OT   Status is: Inpatient  Remains inpatient appropriate because: Will need further improvement and weaning    Consultants:  None  Procedures: None  Antimicrobials:  Anti-infectives (From admission, onward)    Start     Dose/Rate Route Frequency Ordered Stop   06/21/21 2200  azithromycin (ZITHROMAX) 500 mg in sodium chloride 0.9 % 250 mL IVPB        500 mg 250 mL/hr over 60 Minutes Intravenous Every 24 hours 06/21/21 0126     06/21/21 2200  cefTRIAXone (ROCEPHIN) 1 g in sodium chloride 0.9 % 100 mL IVPB        1 g 200 mL/hr over 30 Minutes Intravenous Every 24 hours 06/21/21 0126     06/21/21 0100  cefTRIAXone (ROCEPHIN) 1 g in sodium chloride 0.9 % 100 mL IVPB        1 g 200 mL/hr over 30 Minutes Intravenous  Once 06/21/21 0059 06/21/21 0206   06/21/21 0100  azithromycin (ZITHROMAX) 500 mg in  sodium chloride 0.9 % 250 mL IVPB        500 mg 250 mL/hr over 60 Minutes Intravenous  Once 06/21/21 0059 06/21/21 0329        Subjective: Seen and examined at bedside and he was able to communicate a lot better today but still has some dysarthria and is at times difficult to understand what he saying.  He denied any pain and states he does not wear oxygen at home.  No lightheadedness or dizziness.  Has not been coughing.  Oxygen has been stable so we will try weaning trial.  No nausea or vomiting.  No other concerns or complaints at this time.  Objective: Vitals:   06/22/21 1145 06/22/21 1411 06/22/21 1519 06/22/21 1558  BP:  111/74 112/69   Pulse:  80 81   Resp: 13 20    Temp:  97.8 F (36.6 C) (!) 97.4 F (36.3 C)   TempSrc:  Oral Oral   SpO2: 100% 97% 100% 97%    Intake/Output Summary (Last 24 hours) at 06/22/2021 1740 Last data filed at 06/22/2021 0255 Gross per 24 hour  Intake 351.89 ml  Output --  Net 351.89 ml  There were no vitals filed for this visit.  Examination: Physical Exam:  Constitutional: Thin cachectic chronically ill-appearing African-American male currently no acute distress appears calm Eyes: Lids and conjunctivae normal, sclerae anicteric  ENMT: External Ears, Nose appear normal. Grossly normal hearing. Mucous membranes are moist.  Neck: Appears normal, supple, no cervical masses, normal ROM, no appreciable thyromegaly; no appreciable JVD Respiratory: Diminished to auscultation bilaterally with coarse breath sounds, no wheezing, rales, rhonchi or crackles. Normal respiratory effort and patient is not tachypenic. No accessory muscle use.  Unlabored breathing but is wearing supplemental oxygen nasal cannula Cardiovascular: RRR, no murmurs / rubs / gallops. S1 and S2 auscultated. No extremity edema.  Abdomen: Soft, non-tender, non-distended. No masses palpated. No appreciable hepatosplenomegaly. Bowel sounds positive.  GU: Deferred. Musculoskeletal: No  clubbing / cyanosis of digits/nails.  Has a right toe amputation noted leg some surgical changes of his ankle and his foot Skin: No rashes, lesions, ulcers on to skin evaluation. No induration; Warm and dry.  Neurologic: CN 2-12 grossly intact with no focal deficits but has some dysarthria. Romberg sign and cerebellar reflexes not assessed.  Psychiatric: Normal judgment and insight. Alert and oriented x 2. Normal mood and appropriate affect.   Data Reviewed: I have personally reviewed following labs and imaging studies  CBC: Recent Labs  Lab 06/20/21 2104 06/21/21 0322 06/21/21 0447 06/22/21 0854  WBC 3.9* 3.9*  --  5.6  NEUTROABS 2.3 2.1  --  4.7  HGB 10.7* 11.0* 12.2* 10.2*  HCT 36.9* 37.7* 36.0* 34.1*  MCV 81.6 82.1  --  81.4  PLT 171 178  --  462    Basic Metabolic Panel: Recent Labs  Lab 06/20/21 2104 06/21/21 0322 06/21/21 0447 06/22/21 0854  NA 139 138 139 139  K 3.7 3.8 3.9 3.9  CL 106 102  --  105  CO2 25 26  --  25  GLUCOSE 120* 216*  --  172*  BUN 16 16  --  20  CREATININE 0.77 0.75  --  0.79  CALCIUM 9.8 9.4  --  9.8  MG  --  2.0  --  2.1  PHOS  --  2.5  --  3.2    GFR: CrCl cannot be calculated (Unknown ideal weight.). Liver Function Tests: Recent Labs  Lab 06/21/21 0322 06/22/21 0854  AST 24 28  ALT 29 31  ALKPHOS 124 109  BILITOT 0.6 0.3  PROT 6.9 6.6  ALBUMIN 2.8* 2.6*    No results for input(s): LIPASE, AMYLASE in the last 168 hours. No results for input(s): AMMONIA in the last 168 hours. Coagulation Profile: No results for input(s): INR, PROTIME in the last 168 hours. Cardiac Enzymes: No results for input(s): CKTOTAL, CKMB, CKMBINDEX, TROPONINI in the last 168 hours. BNP (last 3 results) No results for input(s): PROBNP in the last 8760 hours. HbA1C: No results for input(s): HGBA1C in the last 72 hours. CBG: No results for input(s): GLUCAP in the last 168 hours. Lipid Profile: No results for input(s): CHOL, HDL, LDLCALC, TRIG,  CHOLHDL, LDLDIRECT in the last 72 hours. Thyroid Function Tests: No results for input(s): TSH, T4TOTAL, FREET4, T3FREE, THYROIDAB in the last 72 hours. Anemia Panel: No results for input(s): VITAMINB12, FOLATE, FERRITIN, TIBC, IRON, RETICCTPCT in the last 72 hours. Sepsis Labs: Recent Labs  Lab 06/21/21 0446 06/21/21 0459  PROCALCITON  --  <0.10  LATICACIDVEN 1.2  --      Recent Results (from the past 240 hour(s))  Resp Panel by  RT-PCR (Flu A&B, Covid) Nasopharyngeal Swab     Status: None   Collection Time: 06/20/21 10:12 PM   Specimen: Nasopharyngeal Swab; Nasopharyngeal(NP) swabs in vial transport medium  Result Value Ref Range Status   SARS Coronavirus 2 by RT PCR NEGATIVE NEGATIVE Final    Comment: (NOTE) SARS-CoV-2 target nucleic acids are NOT DETECTED.  The SARS-CoV-2 RNA is generally detectable in upper respiratory specimens during the acute phase of infection. The lowest concentration of SARS-CoV-2 viral copies this assay can detect is 138 copies/mL. A negative result does not preclude SARS-Cov-2 infection and should not be used as the sole basis for treatment or other patient management decisions. A negative result may occur with  improper specimen collection/handling, submission of specimen other than nasopharyngeal swab, presence of viral mutation(s) within the areas targeted by this assay, and inadequate number of viral copies(<138 copies/mL). A negative result must be combined with clinical observations, patient history, and epidemiological information. The expected result is Negative.  Fact Sheet for Patients:  EntrepreneurPulse.com.au  Fact Sheet for Healthcare Providers:  IncredibleEmployment.be  This test is no t yet approved or cleared by the [place] FDA and  has been authorized for detection and/or diagnosis of SARS-CoV-2 by FDA under an Emergency Use Authorization (EUA). This EUA will remain  in effect (meaning  this test can be used) for the duration of the COVID-19 declaration under Section 564(b)(1) of the Act, 21 U.S.C.section 360bbb-3(b)(1), unless the authorization is terminated  or revoked sooner.       Influenza A by PCR NEGATIVE NEGATIVE Final   Influenza B by PCR NEGATIVE NEGATIVE Final    Comment: (NOTE) The Xpert Xpress SARS-CoV-2/FLU/RSV plus assay is intended as an aid in the diagnosis of influenza from Nasopharyngeal swab specimens and should not be used as a sole basis for treatment. Nasal washings and aspirates are unacceptable for Xpert Xpress SARS-CoV-2/FLU/RSV testing.  Fact Sheet for Patients: EntrepreneurPulse.com.au  Fact Sheet for Healthcare Providers: IncredibleEmployment.be  This test is not yet approved or cleared by the [place] FDA and has been authorized for detection and/or diagnosis of SARS-CoV-2 by FDA under an Emergency Use Authorization (EUA). This EUA will remain in effect (meaning this test can be used) for the duration of the COVID-19 declaration under Section 564(b)(1) of the Act, 21 U.S.C. section 360bbb-3(b)(1), unless the authorization is terminated or revoked.  Performed at [facility], [place] [address] [place], [place] [address]   Culture, blood (routine x 2)     Status: None (Preliminary result)   Collection Time: 06/21/21  1:20 AM   Specimen: BLOOD LEFT FOREARM  Result Value Ref Range Status   Specimen Description BLOOD LEFT FOREARM  Final   Special Requests   Final    BOTTLES DRAWN AEROBIC ONLY Blood Culture results may not be optimal due to an inadequate volume of blood received in culture bottles   Culture   Final    NO GROWTH 1 DAY Performed at [facility], [place] [address] [place], [place] [address]    Report Status PENDING  Incomplete  Culture, blood (routine x 2)     Status: None (Preliminary result)   Collection Time: 06/21/21  1:25 AM   Specimen: BLOOD  Result Value Ref  Range Status   Specimen Description BLOOD RIGHT ANTECUBITAL  Final   Special Requests   Final    BOTTLES DRAWN AEROBIC AND ANAEROBIC Blood Culture adequate volume   Culture   Final  NO GROWTH 1 DAY Performed at [facility], [place] [address], [place], [place] [address]    Report Status PENDING  Incomplete     RN Pressure Injury Documentation:     Estimated body mass index is 17.33 kg/m as calculated from the following:   Height as of 05/28/21: 6' 2"  (1.88 m).   Weight as of 05/28/21: 61.2 kg.  Malnutrition Type:   Malnutrition Characteristics:   Nutrition Interventions:   Radiology Studies: DG Chest 2 View  Result Date: 06/20/2021 CLINICAL DATA:  Altered mental status with worsening shortness of breath. EXAM: CHEST - 2 VIEW COMPARISON:  May 28, 2021 FINDINGS: There is a dual lead AICD with stable lead wire positioning. Multiple sternal wires and vascular clips are noted. Very mild, bilateral infrahilar atelectasis is seen. There is no evidence of acute infiltrate, pleural effusion or pneumothorax. The heart size and mediastinal contours are within normal limits. Moderate severity calcification of the aortic arch is noted. Multilevel degenerative changes seen throughout the thoracic spine. IMPRESSION: 1. Evidence of prior median sternotomy/CABG. 2. Very mild, bilateral infrahilar atelectasis. Electronically Signed   By: Virgina Norfolk M.D.   On: 06/20/2021 21:46   CT Angio Chest PE W and/or Wo Contrast  Result Date: 06/21/2021 CLINICAL DATA:  Increasing shortness of breath today EXAM: CT ANGIOGRAPHY CHEST WITH CONTRAST TECHNIQUE: Multidetector CT imaging of the chest was performed using the standard protocol during bolus administration of intravenous contrast. Multiplanar CT image reconstructions and MIPs were obtained to evaluate the vascular anatomy. RADIATION DOSE REDUCTION: This exam was performed according to the departmental dose-optimization program which includes  automated exposure control, adjustment of the mA and/or kV according to patient size and/or use of iterative reconstruction technique. CONTRAST:  50m OMNIPAQUE IOHEXOL 350 MG/ML SOLN COMPARISON:  Plain film from the previous day. FINDINGS: Cardiovascular: Atherosclerotic calcifications of the thoracic aorta are noted. No aneurysmal dilatation is seen. Heart is mildly enlarged in size. Coronary calcifications are noted. The pulmonary artery shows a normal branching pattern. No intraluminal filling defect to suggest pulmonary embolism is noted. Defibrillator is again noted. Mediastinum/Nodes: Thoracic inlet is within normal limits. No sizable hilar or mediastinal adenopathy is noted. The esophagus as visualized is within normal limits. Lungs/Pleura: Lungs are well aerated bilaterally. Patchy infiltrate is noted in the left upper lobe consistent with acute pneumonia. Mild emphysematous changes are seen. No sizable parenchymal nodule is noted. No sizable effusion is seen. Upper Abdomen: Visualized upper abdomen is within normal limits. Musculoskeletal: Degenerative changes of the thoracic spine are noted. Review of the MIP images confirms the above findings. IMPRESSION: No evidence of pulmonary emboli. Patchy left upper lobe infiltrate consistent with acute pneumonia. Aortic Atherosclerosis (ICD10-I70.0) and Emphysema (ICD10-J43.9). Electronically Signed   By: MInez CatalinaM.D.   On: 06/21/2021 00:53   DG CHEST PORT 1 VIEW  Result Date: 06/22/2021 CLINICAL DATA:  Shortness of breath EXAM: PORTABLE CHEST 1 VIEW COMPARISON:  CTA of 1 day prior.  Plain film 06/20/2021 FINDINGS: Pacer/AICD device with leads at right atrium and right ventricle. Patient rotated to the right. Prior median sternotomy. Numerous leads and wires project over the chest. Mild cardiomegaly. Atherosclerosis in the transverse aorta. No pleural effusion or pneumothorax. No congestive failure. Improved to resolved left upper lobe airspace disease.  Suspect persistent bibasilar atelectasis. IMPRESSION: Improved to resolved left upper lobe airspace disease. Cardiomegaly without congestive failure. Aortic Atherosclerosis (ICD10-I70.0). Electronically Signed   By: KAbigail MiyamotoM.D.   On: 06/22/2021 08:42  Scheduled Meds:  [START ON 06/23/2021] Chlorhexidine Gluconate Cloth  6 each Topical Q0600   clopidogrel  75 mg Oral QHS   divalproex  125 mg Oral QHS   finasteride  5 mg Oral q morning   ipratropium-albuterol  3 mL Nebulization Q6H   methylPREDNISolone (SOLU-MEDROL) injection  80 mg Intravenous BID   pantoprazole  40 mg Oral Daily   rosuvastatin  10 mg Oral q morning   Continuous Infusions:  azithromycin Stopped (06/22/21 0255)   cefTRIAXone (ROCEPHIN)  IV Stopped (06/22/21 0032)    LOS: 1 day   Kerney Elbe, DO [facility] PAGER is on [place]  If 7PM-7AM, please contact night-coverage [email or web address]

## 2021-06-22 NOTE — Evaluation (Signed)
Clinical/Bedside Swallow Evaluation Patient Details  Name: NASON CONRADT MRN: [id number] Date of Birth: [birth year]  Today's Date: 06/22/2021 Time: SLP Start Time (ACUTE ONLY): 6378 SLP Stop Time (ACUTE ONLY): 5885 SLP Time Calculation (min) (ACUTE ONLY): 15 min  Past Medical History:  Past Medical History:  Diagnosis Date   BPH (benign prostatic hyperplasia)    CAD (coronary artery disease)    Chronic indwelling Foley catheter    CVA (cerebral vascular accident) ([place])    GERD (gastroesophageal reflux disease)    HTN (hypertension)    Neurogenic bladder    SSS (sick sinus syndrome) ([place])    s/p MDT ICD   Vitamin D deficiency    Past Surgical History:  Past Surgical History:  Procedure Laterality Date   ABDOMINAL AORTOGRAM W/LOWER EXTREMITY N/A 03/23/2021   Procedure: ABDOMINAL AORTOGRAM W/LOWER EXTREMITY;  Surgeon: Waynetta Sandy, MD;  Location: [facility];  Service: Cardiovascular;  Laterality: N/A;   AMPUTATION Right 04/01/2021   Procedure: RIGHT GREAT TOE AMPUTATION;  Surgeon: Newt Minion, MD;  Location: [facility];  Service: Orthopedics;  Laterality: Right;   APPENDECTOMY     APPLICATION OF WOUND VAC Right 04/01/2021   Procedure: APPLICATION OF WOUND VAC;  Surgeon: Newt Minion, MD;  Location: [facility];  Service: Orthopedics;  Laterality: Right;   CARDIAC SURGERY     FEMORAL-TIBIAL BYPASS GRAFT Right 03/26/2021   Procedure: BYPASS GRAFT FEMORAL-TIBIAL ARTERY.  [place] right leg saphenous vein, right vein patch angioplasty posterior tibial.;  Surgeon: Marty Heck, MD;  Location: [facility];  Service: Vascular;  Laterality: Right;   PACEMAKER PLACEMENT     HPI:  Patient is an 81 y.o. 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 He was admitted last  month with sepsis secondary to UTI vs osteomyelitis. He presented to the hospital with SOB associated with new onset nonproductive cough as well as subjective fevers. CT chest showed evidence of a left upper lobe infiltrate concerning for pneumonia. He was admitted for management of severe sepsis due to CAP.    Assessment / Plan / Recommendation  Clinical Impression  Patient does not present with clinical s/s of dysphagia as per this bedside swallow evaluation. Of note, during previous admission last month, he was evaluated by ST services for swallow and found to have a functional swallow and did not require f/u. When SLP arrived in room, patient lying in bed, awake and motioning for SLP, saying he had to "take a crap". He wanted assistance to walk to bathroom. Patient's NT informed SLP that for some reason he will not work with her and he will curse and demand she leave the room. When patient's RN entered room a short time later, patient was compliant and consumed graham crackers and straw sips of thin liquids (water). He was somewhat impulsive, chewing quickly, but only exhibited one instance of cough during solid PO intake. 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 SLP Visit Diagnosis: Dysphagia, unspecified (R13.10)    Aspiration Risk  No limitations;Mild aspiration risk    Diet Recommendation Regular;Thin liquid  Medication Administration: Whole meds with liquid Supervision: Patient able to self feed;Intermittent supervision to cue for compensatory strategies Compensations: Minimize environmental distractions;Slow rate;Small sips/bites Postural Changes: Seated upright at 90 degrees    Other  Recommendations Oral Care Recommendations: Oral care BID    Recommendations for follow up therapy are one  component of a multi-disciplinary discharge planning process, led by the attending physician.  Recommendations may be updated based on patient status, additional functional criteria and insurance authorization.  Follow up Recommendations No SLP follow up      Assistance Recommended at Discharge Frequent or constant Supervision/Assistance  Functional Status Assessment Patient has had a recent decline in their functional status and demonstrates the ability to make significant improvements in function in a reasonable and predictable amount of time.  Frequency and Duration min 1 x/week  1 week       Prognosis Prognosis for Safe Diet Advancement: Good Barriers to Reach Goals: Cognitive deficits      Swallow Study   General Date of Onset: 06/21/21 HPI: Patient is an 81 y.o. 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 He was admitted last month with sepsis secondary to UTI vs osteomyelitis. He presented to the hospital with SOB associated with new onset nonproductive cough as well as subjective fevers. CT chest showed evidence of a left upper lobe infiltrate concerning for pneumonia. He was admitted for management of severe sepsis due to CAP. Type of Study: Bedside Swallow Evaluation Previous Swallow Assessment: during admission last month Diet Prior to this Study: Regular;Thin liquids Temperature Spikes Noted: No Respiratory Status: Room air History of Recent Intubation: No Behavior/Cognition: Alert;Confused;Requires cueing;Distractible Oral Cavity Assessment: Within Functional Limits Oral Care Completed by SLP: No Oral Cavity - Dentition: Missing dentition Vision: Functional for self-feeding Self-Feeding Abilities: Able to feed self Patient Positioning: Upright in bed Baseline Vocal  Quality: Normal Volitional Cough: Strong;Cognitively unable to elicit Volitional Swallow: Unable to elicit    Oral/Motor/Sensory Function Overall Oral Motor/Sensory Function: Other (comment) (patient did not participate in full oral motor exam but he does present with moderately impaired speech intelligiblity due to residual 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

## 2021-06-22 NOTE — ED Notes (Signed)
Patient trialed on room air, has maintained O2 levels at 98% and higher with no shortness of breath or distress.

## 2021-06-23 ENCOUNTER — Inpatient Hospital Stay (HOSPITAL_COMMUNITY): Payer: Medicare Other

## 2021-06-23 LAB — CBC WITH DIFFERENTIAL/PLATELET
Abs Immature Granulocytes: 0.03 10*3/uL (ref 0.00–0.07)
Basophils Absolute: 0 10*3/uL (ref 0.0–0.1)
Basophils Relative: 0 %
Eosinophils Absolute: 0 10*3/uL (ref 0.0–0.5)
Eosinophils Relative: 0 %
HCT: 34.9 % — ABNORMAL LOW (ref 39.0–52.0)
Hemoglobin: 10.2 g/dL — ABNORMAL LOW (ref 13.0–17.0)
Immature Granulocytes: 1 %
Lymphocytes Relative: 23 %
Lymphs Abs: 1.2 10*3/uL (ref 0.7–4.0)
MCH: 23.6 pg — ABNORMAL LOW (ref 26.0–34.0)
MCHC: 29.2 g/dL — ABNORMAL LOW (ref 30.0–36.0)
MCV: 80.8 fL (ref 80.0–100.0)
Monocytes Absolute: 0.3 10*3/uL (ref 0.1–1.0)
Monocytes Relative: 5 %
Neutro Abs: 3.7 10*3/uL (ref 1.7–7.7)
Neutrophils Relative %: 71 %
Platelets: 192 10*3/uL (ref 150–400)
RBC: 4.32 MIL/uL (ref 4.22–5.81)
RDW: 21.8 % — ABNORMAL HIGH (ref 11.5–15.5)
WBC: 5.3 10*3/uL (ref 4.0–10.5)
nRBC: 0 % (ref 0.0–0.2)

## 2021-06-23 LAB — COMPREHENSIVE METABOLIC PANEL
ALT: 46 U/L — ABNORMAL HIGH (ref 0–44)
AST: 36 U/L (ref 15–41)
Albumin: 2.6 g/dL — ABNORMAL LOW (ref 3.5–5.0)
Alkaline Phosphatase: 114 U/L (ref 38–126)
Anion gap: 4 — ABNORMAL LOW (ref 5–15)
BUN: 16 mg/dL (ref 8–23)
CO2: 28 mmol/L (ref 22–32)
Calcium: 9.1 mg/dL (ref 8.9–10.3)
Chloride: 106 mmol/L (ref 98–111)
Creatinine, Ser: 0.81 mg/dL (ref 0.61–1.24)
GFR, Estimated: >60 mL/min (ref >60)
Glucose, Bld: 165 mg/dL — ABNORMAL HIGH (ref 70–99)
Potassium: 3.5 mmol/L (ref 3.5–5.1)
Sodium: 138 mmol/L (ref 135–145)
Total Bilirubin: 0.3 mg/dL (ref 0.3–1.2)
Total Protein: 6.3 g/dL — ABNORMAL LOW (ref 6.5–8.1)

## 2021-06-23 LAB — MAGNESIUM: Magnesium: 2.1 mg/dL (ref 1.7–2.4)

## 2021-06-23 LAB — PHOSPHORUS: Phosphorus: 1.7 mg/dL — ABNORMAL LOW (ref 2.5–4.6)

## 2021-06-23 MED ORDER — K PHOS MONO-SOD PHOS DI & MONO 155-852-130 MG PO TABS
500.0000 mg | ORAL_TABLET | Freq: Two times a day (BID) | ORAL | Status: AC
  Administered 2021-06-23 (×2): 500 mg via ORAL
  Filled 2021-06-23 (×2): qty 2

## 2021-06-23 MED ORDER — GUAIFENESIN ER 600 MG PO TB12
1200.0000 mg | ORAL_TABLET | Freq: Two times a day (BID) | ORAL | Status: DC
  Administered 2021-06-23 – 2021-06-24 (×3): 1200 mg via ORAL
  Filled 2021-06-23 (×3): qty 2

## 2021-06-23 MED ORDER — FUROSEMIDE 10 MG/ML IJ SOLN
40.0000 mg | Freq: Once | INTRAMUSCULAR | Status: AC
  Administered 2021-06-23: 40 mg via INTRAVENOUS
  Filled 2021-06-23: qty 4

## 2021-06-23 MED ORDER — IPRATROPIUM BROMIDE 0.02 % IN SOLN
0.5000 mg | Freq: Four times a day (QID) | RESPIRATORY_TRACT | Status: DC
  Administered 2021-06-23 – 2021-06-24 (×4): 0.5 mg via RESPIRATORY_TRACT
  Filled 2021-06-23 (×3): qty 2.5

## 2021-06-23 MED ORDER — LEVALBUTEROL HCL 0.63 MG/3ML IN NEBU
0.6300 mg | INHALATION_SOLUTION | Freq: Four times a day (QID) | RESPIRATORY_TRACT | Status: DC
  Administered 2021-06-23 – 2021-06-24 (×2): 0.63 mg via RESPIRATORY_TRACT
  Filled 2021-06-23 (×6): qty 3

## 2021-06-23 NOTE — TOC Transition Note (Signed)
Transition of Care [facility]) - CM/SW Discharge Note   Patient Details  Name: Todd Dunn MRN: [id number] Date of Birth: [birth year]  [facility]) CM/SW Contact:  Carles Collet, RN Phone Number: 06/23/2021, 12:47 PM   Clinical Narrative:    Damaris Schooner w patient's granddaughter over the phone.  Patient will return home at DC and resume [facility] services through [facility]). [place] RN PT OT. [name] states that patient has WC at home, no other DME needs. No other TOC needs identified at this time.   [name] (Granddaughter)  [phone]     Final next level of care: [place] Barriers to Discharge: No Barriers Identified   Patient Goals and CMS Choice Patient states their goals for this hospitalization and ongoing recovery are:: return home CMS Medicare.gov Compare Post Acute Care list provided to:: Other (Comment Required) Choice offered to / list presented to : Adult Children  Discharge Placement                       Discharge Plan and Services                            [facility] Agency: [place] ([facility]) Date HH Agency Contacted: 06/23/21 Time [place]: 1247 Representative spoke with at [place]: [place] ([place]) Interventions     Readmission Risk Interventions Readmission Risk Prevention Plan 02/17/2021  Transportation Screening Complete  Medication Review Press photographer) Complete  PCP or Specialist appointment within 3-5 days of discharge Complete  HRI or [place] Complete  SW Recovery Care/Counseling Consult Complete  [place] Not Applicable

## 2021-06-23 NOTE — Evaluation (Signed)
Occupational Therapy Evaluation Patient Details Name: Todd Dunn MRN: [id number] DOB: May 23, 1941 Today's Date: 06/23/2021   History of Present Illness 81 y.o. male who is admitted to [facility] on 06/20/2021 with severe sepsis due to community-acquired pneumonia.  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  Current level of function may be below patient's typical baseline which is currently unknown except for pt's past therapy evaluation from 05/13/21.  During this evaluation, patient was limited by generalized weakness, impaired activity tolerance, decreased flexibility to LEs and trunk and impulsivity, all of which has the potential to impact patient's safety and independence during functional mobility, as well as performance for ADLs.  Patient lives with family and endorses near 24/7 supervision and assistance however pt dysarthric and unintelligible at times and no family in room to provide information.  Patient demonstrates good rehab potential, and should benefit from continued skilled occupational therapy services while in acute care to maximize safety, independence and quality of life at home.  Continued occupational therapy services in the home is recommended.  ?     Recommendations for follow up therapy are one component of a multi-disciplinary discharge planning process, led by the attending physician.   Recommendations may be updated based on patient status, additional functional criteria and insurance authorization.   Follow Up Recommendations  Home health OT    Assistance Recommended at Discharge Frequent or constant Supervision/Assistance  Patient can return home with the following Two people to help with walking and/or transfers;A little help with bathing/dressing/bathroom;Direct supervision/assist for financial management;Assist for transportation    Functional Status Assessment  Patient has had a recent decline in their functional status and demonstrates the ability to make significant improvements in function in a reasonable and predictable amount of time.  Equipment Recommendations  None recommended by OT    Recommendations for Other Services       Precautions / Restrictions Precautions Precautions: Fall Precaution Comments: Contact Restrictions Weight Bearing Restrictions: No      Mobility Bed Mobility Overal bed mobility: Needs Assistance Bed Mobility: Supine to Sit     Supine to sit: Min assist Sit to supine: Min guard   General bed mobility comments: pt reaching out for assist, but then not utilizing when offered, reached to pull on foot board and able to scoot out to EOB    Transfers Overall transfer level: Needs assistance Equipment used: Rolling walker (2 wheels) Transfers: Sit to/from Stand, Bed to chair/wheelchair/BSC Sit to Stand: Min assist, +2 safety/equipment, From elevated surface Stand pivot transfers: Min assist, +2 physical assistance   Step pivot transfers: Min assist, +2 safety/equipment     General transfer comment: from elevated EOB; from chair with armrests (standard height); pt pushing RW out of the way once standing to pivot to adjacent chair; impulsively removes hands from walker and has very narrow BOS      Balance Overall balance assessment: Needs assistance Sitting-balance support: No  upper extremity supported Sitting  balance-Leahy Scale: Fair Sitting balance - Comments: with closeguarding can reach down to straighten sock; moves impulsively   Standing balance support: During functional activity, Reliant on assistive device for balance Standing balance-Leahy Scale: Poor                             ADL either performed or assessed with clinical judgement   ADL Overall ADL's : Needs assistance/impaired Eating/Feeding: Set up;Bed level Eating/Feeding Details (indicate cue type and reason): Needed assist to open certain containers. Using RT UE for feeding. Grooming: Wash/dry hands;Wash/dry face;Set up;Sitting   Upper Body Bathing: Min guard;Set up;Sitting   Lower Body Bathing: Moderate assistance;Sit to/from stand;Sitting/lateral leans   Upper Body Dressing : Minimal assistance;Sitting   Lower Body Dressing: Sitting/lateral leans;Maximal assistance Lower Body Dressing Details (indicate cue type and reason): Max Assist to don socks while pt sat EOB. Pt able to grasp ankle band to pull socks up. Toilet Transfer: Minimal assistance;+2 for safety/equipment;Stand-pivot;Regular Toilet;Cueing for safety;Cueing for sequencing;Grab bars;Rolling walker (2 wheels) Toilet Transfer Details (indicate cue type and reason): Pt unsafe when attempting ambulation to bathroom, releasing RW and reaching for distractions on sink vanity.After ~6' with RW Arm chair was brought behind pt to sit for safety. Pt then performed stand pivot to recliner. All transitions with Min As of 2 people for safety and constant cues. Toileting- Clothing Manipulation and Hygiene: Minimal assistance;Sit to/from stand;Cueing for safety Toileting - Clothing Manipulation Details (indicate cue type and reason): Based on general assessment.     Functional mobility during ADLs: Minimal assistance;+2 for physical assistance;Cueing for safety;Cueing for sequencing;Rolling walker (2 wheels)       Vision Baseline Vision/History: 1 Wears  glasses Ability to See in Adequate Light: 0 Adequate Patient Visual Report: No change from baseline       Perception     Praxis      Pertinent Vitals/Pain Pain Assessment Pain Assessment: No/denies pain     Hand Dominance  (ambidextrous but seems to favor RT)   Extremity/Trunk Assessment Upper Extremity Assessment Upper Extremity Assessment: Generalized weakness LUE Deficits / Details: ROM WFL, strength 4/5 grossly   Lower Extremity Assessment RLE Deficits / Details: 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 RLE Coordination: decreased gross motor LLE Deficits / Details: ROM WFL, strength grossly 4-/5 LLE Coordination: decreased gross motor   Cervical / Trunk Assessment Cervical / Trunk Assessment: Normal   Communication Communication Communication: Expressive difficulties   Cognition Arousal/Alertness: Awake/alert Behavior During Therapy: Impulsive Overall Cognitive Status: History of cognitive impairments - at baseline                                 General Comments: alert and oriented to person, place. Impaired safety awareness.     General Comments       Exercises     Shoulder Instructions      Home Living Family/patient expects to be discharged to:: Private residence Living Arrangements: Other relatives Advertising account executive and great grandson) Available Help at Discharge: Family Type of Home: House Home Access: Stairs to enter Technical brewer of Steps: 2   Home Layout: One level     Bathroom Shower/Tub: Tub/shower unit;Curtain;Walk-in shower   Bathroom Toilet: Standard Bathroom Accessibility: Yes How Accessible: Accessible via wheelchair Home Equipment: Conservation officer, nature (2 wheels);Wheelchair - manual;Grab bars - tub/shower   Additional Comments: information from recent admission  Prior Functioning/Environment Prior Level of Function : Needs assist  Cognitive Assist : ADLs (cognitive);Mobility (cognitive) Mobility (Cognitive):  Intermittent cues ADLs (Cognitive): Intermittent cues Physical Assist : Mobility (physical);ADLs (physical) Mobility (physical): Bed mobility;Transfers ADLs (physical): Grooming;Bathing;Dressing;Toileting;IADLs Mobility Comments: reports uses wheelchair (manual); no longer walks; uses RW for transfer to wheelchair          OT Problem List: Decreased strength;Impaired balance (sitting and/or standing);Decreased cognition;Decreased knowledge of precautions;Decreased safety awareness;Decreased activity tolerance;Decreased knowledge of use of DME or AE;Decreased coordination      OT Treatment/Interventions: Self-care/ADL training;Therapeutic exercise;Neuromuscular education;Balance training;Therapeutic activities;DME and/or AE instruction    OT Goals(Current goals can be found in the care plan section) Acute Rehab OT Goals Patient Stated Goal: Go home OT Goal Formulation: With patient Time For Goal Achievement: 07/07/21 Potential to Achieve Goals: Good ADL Goals Pt Will Perform Grooming: with supervision;standing Pt Will Perform Lower Body Dressing: sit to/from stand;with caregiver independent in assisting;with min guard assist Pt Will Transfer to Toilet: with min guard assist;ambulating Pt Will Perform Toileting - Clothing Manipulation and hygiene: sit to/from stand;sitting/lateral leans;with min guard assist;with caregiver independent in assisting Additional ADL Goal #1: Pt will demonstrate improved mentation and safety awareness by following 4/5 1-2 step instructions, and answering 4/4 safety questions from the [facility] correctly:   1. What do you do for yourself if you are sick with a cold.    2. What do you do if you burn yourself and the wound becomes infected.    3. What do you do if you experience severe chest pain and shortness of breath?   4. What number do you call in an emergency?  OT Frequency: Min 2X/week    Co-evaluation PT/OT/SLP Co-Evaluation/Treatment: Yes Reason for  Co-Treatment: Complexity of the patient's impairments (multi-system involvement);Necessary to address cognition/behavior during functional activity;For patient/therapist safety PT goals addressed during session: Mobility/safety with mobility OT goals addressed during session: ADL's and self-care      AM-PAC OT "6 Clicks" Daily Activity     Outcome Measure Help from another person eating meals?: A Little Help from another person taking care of personal grooming?: A Little Help from another person toileting, which includes using toliet, bedpan, or urinal?: A Little Help from another person bathing (including washing, rinsing, drying)?: A Little Help from another person to put on and taking off regular upper body clothing?: A Little Help from another person to put on and taking off regular lower body clothing?: A Lot 6 Click Score: 17   End of Session Equipment Utilized During Treatment: Gait belt;Rolling walker (2 wheels) Nurse Communication: Other (comment) (In chair, impulsve)  Activity Tolerance: Patient tolerated treatment well Patient left: in chair;with call bell/phone within reach;with chair alarm set  OT Visit Diagnosis: Unsteadiness on feet (R26.81);Other abnormalities of gait and mobility (R26.89);Muscle weakness (generalized) (M62.81);Other symptoms and signs involving cognitive function                Time: 7903-8333 OT Time Calculation (min): 36 min Charges:  OT General Charges $OT Visit: 1 Visit OT Evaluation $OT Eval Moderate Complexity: 1 Mod  Nicanor Mendolia, OT Acute Rehab Services Office: [phone] 06/23/2021 Julien Girt 06/23/2021, 1:27 PM

## 2021-06-23 NOTE — Evaluation (Addendum)
Physical Therapy Evaluation Patient Details Name: Todd Dunn MRN: [id number] DOB: August 02, 1940 Today's Date: 06/23/2021  History of Present Illness  81 y.o. male who is admitted to [facility] on 06/20/2021 with severe sepsis due to community-acquired pneumonia.  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 PTA patient was using wheelchair for locomotion and transferring with RW and +1 assist.  Pt currently requires 2 person min assist for transfer with RW with pt showing impulsivity and not keeping his hands on his RW. Anticipate pt is close to his baseline; will follow during acute hospitatlization to assure ability to return home.  Anticipate patient will benefit from PT to address problems listed below. Will continue to follow acutely to maximize functional mobility independence and safety.          Recommendations for follow up therapy are one component of a multi-disciplinary discharge planning process, led by the attending physician.  Recommendations may be updated based on patient status, additional functional criteria and insurance authorization.  Follow Up Recommendations Home health PT    Assistance Recommended at Discharge Frequent or constant Supervision/Assistance  Patient can return home with the following  A little help with walking and/or transfers;Direct supervision/assist for medications management;Direct supervision/assist for financial management;Help with stairs or ramp for entrance;Assist for transportation    Equipment Recommendations None recommended by PT  Recommendations for Other Services       Functional Status Assessment Patient has had a recent decline in their  functional status and demonstrates the ability to make significant improvements in function in a reasonable and predictable amount of time.     Precautions / Restrictions Precautions Precautions: Fall Precaution Comments: Contact      Mobility  Bed Mobility Overal bed mobility: Needs Assistance Bed Mobility: Supine to Sit     Supine to sit: Min assist     General bed mobility comments: pt reaching out for assist, but then not utilizing when offered, reached to pull on foot board and able to scoot out to EOB    Transfers Overall transfer level: Needs assistance Equipment used: Rolling walker (2 wheels) Transfers: Sit to/from Stand, Bed to chair/wheelchair/BSC Sit to Stand: Min assist, +2 safety/equipment, From elevated surface Stand pivot transfers: Min assist, +2 physical assistance         General transfer comment: from elevated EOB; from chair with armrests (standard height); pt pushing RW out of the way once standing to pivot to adjacent chair; impulsively removes hands from walker and has very narrow BOS    Ambulation/Gait Ambulation/Gait assistance: Mod assist, +2 physical assistance Gait Distance (Feet): 5 Feet Assistive device: Rolling walker (2 wheels) Gait Pattern/deviations: Step-through pattern, Decreased stride length, Scissoring, Trunk flexed, Narrow base of support       General Gait Details: pt pushing RW too far ahead; impulsively letting go of RW and reaching to countertop; chair brought to pt due to unsafe behaviors on top of scissoring gait  Stairs            Wheelchair Mobility    Modified Rankin (Stroke Patients Only)       Balance Overall balance assessment: Needs assistance Sitting-balance support: No upper extremity supported Sitting balance-Leahy Scale: Fair Sitting balance -  Comments: with closeguarding can reach down to straighten sock; moves impulsively   Standing balance support: Bilateral upper extremity supported, Reliant  on assistive device for balance Standing balance-Leahy Scale: Poor                               Pertinent Vitals/Pain Pain Assessment Pain Assessment: No/denies pain    Home Living Family/patient expects to be discharged to:: Private residence Living Arrangements: Other relatives Advertising account executive, great grandson) Available Help at Discharge: Family Type of Home: House Home Access: Stairs to enter   Technical brewer of Steps: 2   Home Layout: One level Home Equipment: Conservation officer, nature (2 wheels);Wheelchair - manual;Grab bars - tub/shower Additional Comments: information from recent admission    Prior Function Prior Level of Function : Needs assist  Cognitive Assist : ADLs (cognitive);Mobility (cognitive)     Physical Assist : Mobility (physical);ADLs (physical) Mobility (physical): Bed mobility;Transfers ADLs (physical): Grooming;Bathing;Dressing;Toileting;IADLs Mobility Comments: reports uses wheelchair (manual); no longer walks; uses RW for transfer to wheelchair       Hand Dominance   Dominant Hand:  (ambidextrous)    Extremity/Trunk Assessment   Upper Extremity Assessment Upper Extremity Assessment: Defer to OT evaluation    Lower Extremity Assessment Lower Extremity Assessment: Generalized weakness RLE Deficits / Details: 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 RLE Coordination: decreased gross motor LLE Coordination: decreased gross motor    Cervical / Trunk Assessment Cervical / Trunk Assessment: Normal  Communication   Communication: Expressive difficulties  Cognition Arousal/Alertness: Awake/alert Behavior During Therapy: Impulsive Overall Cognitive Status: History of cognitive impairments - at baseline                                 General Comments: alert and oriented to person, place        General Comments General comments (skin integrity, edema, etc.): ?desaturation to 87% with ambulation (poor pleth, but as recovering  appeared to be accurate)    Exercises     Assessment/Plan    PT Assessment Patient needs continued PT services  PT Problem List Decreased strength;Decreased activity tolerance;Decreased mobility;Decreased balance;Decreased cognition;Decreased coordination;Decreased knowledge of use of DME;Decreased safety awareness;Decreased knowledge of precautions;Cardiopulmonary status limiting activity       PT Treatment Interventions DME instruction;Gait training;Functional mobility training;Therapeutic activities;Therapeutic exercise;Balance training;Stair training;Neuromuscular re-education;Cognitive remediation;Patient/family education    PT Goals (Current goals can be found in the Care Plan section)  Acute Rehab PT Goals Patient Stated Goal: to return home PT Goal Formulation: With patient Time For Goal Achievement: 07/07/21 Potential to Achieve Goals: Fair    Frequency Min 3X/week     Co-evaluation PT/OT/SLP Co-Evaluation/Treatment: Yes Reason for Co-Treatment: Complexity of the patient's impairments (multi-system involvement);Necessary to address cognition/behavior during functional activity PT goals addressed during session: Mobility/safety with mobility;Balance;Proper use of DME         AM-PAC PT "6 Clicks" Mobility  Outcome Measure Help needed turning from your back to your side while in a flat bed without using bedrails?: A Little Help needed moving from lying on your back to sitting on the side of a flat bed without using bedrails?: A Little Help needed moving to and from a bed to a chair (including a wheelchair)?: Total Help needed standing up from a chair using your arms (e.g., wheelchair or bedside chair)?: Total Help needed to walk in hospital room?: Total Help needed climbing 3-5 steps  with a railing? : Total 6 Click Score: 10    End of Session Equipment Utilized During Treatment: Gait belt Activity Tolerance: Patient tolerated treatment well Patient left: in  chair;with call bell/phone within reach;with chair alarm set Nurse Communication: Mobility status PT Visit Diagnosis: Unsteadiness on feet (R26.81);Other abnormalities of gait and mobility (R26.89);Muscle weakness (generalized) (M62.81)    Time: 1980-2217 PT Time Calculation (min) (ACUTE ONLY): 38 min   Charges:   PT Evaluation $PT Eval Moderate Complexity: 1 Mod PT Treatments $Therapeutic Activity: 8-22 mins         Arby Barrette, PT Acute Rehabilitation Services  Pager [phone] Office [phone]   Rexanne Mano 06/23/2021, 11:02 AM

## 2021-06-23 NOTE — Progress Notes (Signed)
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  History is unable to be obtained due to the patient's current condition and his baseline dysarthria and dysphagia but is reported that he had 2 days of shortness of breath associated new onset cough and subjective fevers.  He does have a long smoking history and smoked greater than a pack per day for least 20 to 25 years.  In the ED had work-up done with a CT of the chest which showed no evidence of acute PE but did show evidence of a left upper lobe infiltrate concerning for pneumonia in the absence of any edema comfortable fusion pneumothorax.  He was admitted and given azithromycin Rocephin Lasix and then subsequently placed on an telemetry unit for management of severe sepsis due to community-acquired pneumonia.  We have added breathing treatments given that he is still short of breath today.  We will continue Solu-Medrol 1 times daily  Assessment & Plan:   Principal Problem:   CAP (community acquired pneumonia) Active Problems:   Hyperlipidemia   BPH (benign prostatic hyperplasia)   Chronic systolic heart failure (HCC)   Severe sepsis (HCC)   SOB (shortness of breath)   GERD (gastroesophageal reflux disease)  Severe sepsis in the setting of community-acquired pneumonia, poA, improving  -Has had progressive 2 days of progressive worsening shortness of breath new onset cough with subjective fevers CTA showing evidence of left upper lobe infiltrate concerning for pneumonia -We will get SLP  evaluation and pending  -Met SIRS criteria on admission with leukopenia and tachypnea.  -Did not receive the 30 mL/kg IV fluid bolus -C/w DuoNeb 3 mL Neb q6h and Albuterol 2.5 mg Neb q4hprn Wheezing and SOB -Blood cultures x2 strep pneumo urinary antigen was negative -Getting IV Solu-Medrol 80 mg Q 12 and will wean to 60 mg q12h present changed to 120 mg daily -Added Xopenex and Atrovent scheduled -His procalcitonin level was less than 0.10 and a lactic acid was 1.2 -Continue with azithromycin and ceftriaxone for now -WBC was 3.9 and improved to 5.6 yesterday and is now 5.3 -Repeat CXR in the AM   Acute respiratory failure with hypoxia in the setting of pneumonia as above -He does not have a baseline oxygen requirement but was requiring supplemental oxygen as he was presenting with saturations in the mid 80s on arrival with improvement to the high 90s on 2 L -Has pneumonia noted on his CT scan -Does not have a formal diagnosis of COPD but may have an element of COPD given his prior history of smoking -Continue with breathing treatments and he has an albuterol inhaler at home -SpO2: 100 % O2 Flow Rate (L/min): 2 L/min; Now off of O2 via [place] and improving but still short of breath with exertion -His respiratory failure is less likely in the setting of ACS given that he is not having any chest pain no evidence of significant with elevated troponin -CTA as above -Added Xopenex and Atrovent -COVID-19 and influenza AMB negative -Continue supplemental oxygen via nasal cannula and wean O2 as tolerated -We will need  an amatory home O2 screen prior to discharge and repeat chest x-ray in a.m. -Continue scheduled DuoNeb treatments as needed albuterol nebulizer and Solu-Medrol as well as antibiotics with ceftriaxone and azithromycin; Anticipating D/C in the next 24-48 hours if improved.  Chronic combined systolic and diastolic CHF -Most recent echo showed an EF of 30 to 35% with LV global hypokinesis  and mildly dilated left ventricle and evidence of grade 1 diastolic dysfunction -Troponin was elevated but flat at 23 -BNP was 677.4 -Strict I's and O's and daily weights and continue monitor and may consider initiation of beta-blocker and ACE inhibitor -Patient is -3078 mL since admission -Give IV Lasix 40 mg x1 given that he is still SOB on Exertion -IF continues to Be Dyspneic will obtain repeat ECHO  Hypophosphatemia -Patient's Phos Level was 1.7 -Replete with po K Phos 500 mg po BID x2 -Continue to Monitor and Repeat Phos Level in the AM  Normocytic anemia -Patient's hemoglobin/hematocrit went from 10.7/36.9 -> 11.0/37.7 -> 12.2/36.0 -> 10.2/34.1 -> 10.2/34.9 -Check anemia panel in a.m. 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 Gale Journey about treatment of this and he recommends no treatment given that he is asymptomatic this is likely a colonization  GERD/GI Prophylaxis -C/w PPI  HLD -C/w Statin   History of CVA -Continue with statin and clopidogrel -Continue with divalproex 1.5 mg p.o. nightly  Elevated AST -ALT went from 29 -> 31 -> 46 -Continue to Monitor and Trend -Repeat CMP in the AM   DVT prophylaxis: SCDs; Will add Lovenox Code Status: FULL CODE Family Communication: No family present at bedside but I  spoke to Granddaughter over the telephone  Disposition Plan: Pending further clinical improvement and evaluation by PT/OT   Status is: Inpatient  Remains inpatient appropriate because: Will need further improvement and weaning    Consultants:  None  Procedures: None  Antimicrobials:  Anti-infectives (From admission, onward)    Start     Dose/Rate Route Frequency Ordered Stop   06/21/21 2200  azithromycin (ZITHROMAX) 500 mg in sodium chloride 0.9 % 250 mL IVPB        500 mg 250 mL/hr over 60 Minutes Intravenous Every 24 hours 06/21/21 0126     06/21/21 2200  cefTRIAXone (ROCEPHIN) 1 g in sodium chloride 0.9 % 100 mL IVPB        1 g 200 mL/hr over 30 Minutes Intravenous Every 24 hours 06/21/21 0126     06/21/21 0100  cefTRIAXone (ROCEPHIN) 1 g in sodium chloride 0.9 % 100 mL IVPB        1 g 200 mL/hr over 30 Minutes Intravenous  Once 06/21/21 0059 06/21/21 0206   06/21/21 0100  azithromycin (ZITHROMAX) 500 mg in sodium chloride 0.9 % 250 mL IVPB        500 mg 250 mL/hr over 60 Minutes Intravenous  Once 06/21/21 0059 06/21/21 0329        Subjective:  Seen and examined at bedside and he is doing better from a mental status point but continues to be short of breath.  Weaned off oxygen.  No nausea or vomiting.  Denies any lightheadedness or dizziness.  No other concerns or complaints this time.  Patient will be discharged however his granddaughter felt that he is still not back to his baseline so we will watch another day and diuresis and start scheduled breathing treatments.  Objective: Vitals:   06/23/21 0804 06/23/21 1225 06/23/21 1445 06/23/21 1558  BP:  136/74  102/64  Pulse:  80  87  Resp:  20  19  Temp:  97.6 F (36.4 C)  97.6 F (36.4 C)  TempSrc:  Oral  Oral  SpO2: 98% 91% 98% 100%    Intake/Output Summary (Last 24 hours) at 06/23/2021 1718 Last data filed at 06/23/2021 1707 Gross per 24 hour  Intake 120 ml  Output 2675 ml  Net -2555 ml    There were no vitals  filed for this visit.  Examination: Physical Exam:  Constitutional: Thin cachectic chronically ill-appearing African-American male male currently in no acute distress appears calm and resting  Eyes: Lids and conjunctivae normal, sclerae anicteric  ENMT: External Ears, Nose appear normal. Grossly normal hearing. Mucous membranes are moist. Posterior pharynx clear of any exudate or lesions. Normal dentition.  Neck: Appears normal, supple, no cervical masses, normal ROM, no appreciable thyromegaly Respiratory: Diminished to auscultation bilaterally with coarse breath sounds and some slight wheezing, but no rales, rhonchi or crackles. Normal respiratory effort and patient is not tachypenic. No accessory muscle use. Unlabored breathing  Cardiovascular: RRR, no murmurs / rubs / gallops. S1 and S2 auscultated. Minimal edema  Abdomen: Soft, non-tender, non-distended. Bowel sounds positive.  GU: Deferred. Musculoskeletal: No clubbing / cyanosis of digits/nails. 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 No induration; Warm and dry.  Neurologic: CN 2-12 grossly intact with no focal deficits but does have some dysarthria. Romberg sign cerebellar reflexes not assessed.  Psychiatric: Normal judgment and insight. Alert and oriented x 2. Normal mood and appropriate affect.   Data Reviewed: I have personally reviewed following labs and imaging studies  CBC: Recent Labs  Lab 06/20/21 2104 06/21/21 0322 06/21/21 0447 06/22/21 0854 06/23/21 1005  WBC 3.9* 3.9*  --  5.6 5.3  NEUTROABS 2.3 2.1  --  4.7 3.7  HGB 10.7* 11.0* 12.2* 10.2* 10.2*  HCT 36.9* 37.7* 36.0* 34.1* 34.9*  MCV 81.6 82.1  --  81.4 80.8  PLT 171 178  --  181 500    Basic Metabolic Panel: Recent Labs  Lab 06/20/21 2104 06/21/21 0322 06/21/21 0447 06/22/21 0854 06/23/21 1005  NA 139 138 139 139 138  K 3.7 3.8 3.9 3.9 3.5  CL 106  102  --  105 106  CO2 25 26  --  25 28  GLUCOSE 120* 216*  --  172* 165*  BUN 16 16  --  20 16  CREATININE 0.77 0.75  --  0.79 0.81  CALCIUM 9.8 9.4  --  9.8 9.1  MG  --  2.0  --  2.1 2.1  PHOS  --  2.5  --  3.2 1.7*    GFR: CrCl cannot be calculated (Unknown ideal weight.). Liver Function Tests: Recent Labs  Lab 06/21/21 0322 06/22/21 0854 06/23/21 1005  AST 24 28 36  ALT 29 31 46*  ALKPHOS 124 109 114  BILITOT 0.6 0.3 0.3  PROT 6.9 6.6 6.3*  ALBUMIN 2.8* 2.6* 2.6*    No results for input(s): LIPASE, AMYLASE in the last 168 hours. No results for input(s): AMMONIA in the last 168 hours. Coagulation Profile: No results for input(s): INR, PROTIME in the last 168 hours. Cardiac Enzymes: No results for input(s): CKTOTAL, CKMB, CKMBINDEX, TROPONINI in the last 168 hours. BNP (last 3 results) No results for input(s): PROBNP in the last 8760 hours. HbA1C: No results for input(s): HGBA1C in the last 72 hours. CBG: No results for input(s): GLUCAP in the last 168 hours. Lipid Profile: No results for input(s): CHOL, HDL, LDLCALC, TRIG, CHOLHDL, LDLDIRECT in the last 72 hours. Thyroid Function Tests: No results for input(s): TSH, T4TOTAL, FREET4, T3FREE, THYROIDAB in the last 72 hours. Anemia Panel: No results for input(s): VITAMINB12, FOLATE, FERRITIN, TIBC, IRON, RETICCTPCT in the last 72 hours. Sepsis Labs: Recent Labs  Lab 06/21/21 0446 06/21/21 0459  PROCALCITON  --  <0.10  LATICACIDVEN 1.2  --      Recent Results (from the past 240 hour(s))  Resp Panel by RT-PCR (Flu A&B, Covid) Nasopharyngeal Swab     Status: None   Collection Time: 06/20/21 10:12 PM   Specimen: Nasopharyngeal Swab; Nasopharyngeal(NP) swabs in vial transport medium  Result Value Ref Range Status   SARS Coronavirus 2 by RT PCR NEGATIVE NEGATIVE Final    Comment: (NOTE) SARS-CoV-2 target nucleic acids are NOT DETECTED.  The SARS-CoV-2 RNA is generally detectable in upper respiratory specimens  during the acute phase of infection. The lowest concentration of SARS-CoV-2 viral copies this assay can detect is 138 copies/mL. A negative result does not preclude SARS-Cov-2 infection and should not be used as the sole basis for treatment or other patient management decisions. A negative result may occur with  improper specimen collection/handling, submission of specimen other than nasopharyngeal swab, presence of viral mutation(s) within the areas targeted by this assay, and inadequate number of viral copies(<138 copies/mL). A negative result must be combined with clinical observations, patient history, and epidemiological information. The expected result is Negative.  Fact Sheet for Patients:  EntrepreneurPulse.com.au  Fact Sheet for Healthcare Providers:  IncredibleEmployment.be  This test is no t yet approved or cleared by the [place] FDA and  has been authorized for detection and/or diagnosis of SARS-CoV-2 by FDA under an Emergency Use Authorization (EUA). This EUA will remain  in effect (meaning this test can be used) for the duration of the COVID-19 declaration under Section 564(b)(1) of the Act, 21 U.S.C.section 360bbb-3(b)(1), unless the authorization is terminated  or revoked sooner.       Influenza A by PCR NEGATIVE NEGATIVE Final   Influenza B by PCR NEGATIVE NEGATIVE Final    Comment: (NOTE) The Xpert Xpress SARS-CoV-2/FLU/RSV plus assay is intended as an aid in the diagnosis of influenza from Nasopharyngeal swab specimens and should not be used as a sole basis for treatment. Nasal washings and aspirates are unacceptable for Xpert Xpress SARS-CoV-2/FLU/RSV testing.  Fact Sheet for Patients: EntrepreneurPulse.com.au  Fact Sheet for Healthcare Providers: IncredibleEmployment.be  This test is not yet approved or cleared by the [place] FDA and has been authorized for detection  and/or diagnosis of SARS-CoV-2 by FDA under an Emergency Use Authorization (EUA). This EUA will remain in effect (meaning this test can be used) for the duration of the COVID-19 declaration under Section 564(b)(1) of the Act, 21 U.S.C. section 360bbb-3(b)(1), unless the authorization is terminated  or revoked.  Performed at [facility], [place] [address] [place], [place] [address]   Culture, blood (routine x 2)     Status: None (Preliminary result)   Collection Time: 06/21/21  1:20 AM   Specimen: BLOOD LEFT FOREARM  Result Value Ref Range Status   Specimen Description BLOOD LEFT FOREARM  Final   Special Requests   Final    BOTTLES DRAWN AEROBIC ONLY Blood Culture results may not be optimal due to an inadequate volume of blood received in culture bottles   Culture   Final    NO GROWTH 2 DAYS Performed at [facility], [place] [address] [place], [place] [address]    Report Status PENDING  Incomplete  Culture, blood (routine x 2)     Status: None (Preliminary result)   Collection Time: 06/21/21  1:25 AM   Specimen: BLOOD  Result Value Ref Range Status   Specimen Description BLOOD RIGHT ANTECUBITAL  Final   Special Requests   Final    BOTTLES DRAWN AEROBIC AND ANAEROBIC Blood Culture adequate volume   Culture   Final    NO GROWTH 2 DAYS Performed at [facility], [place] [address] [place], [place] [address]    Report Status PENDING  Incomplete  Urine Culture     Status: Abnormal (Preliminary result)   Collection Time: 06/21/21  5:26 PM   Specimen: Urine, Catheterized  Result Value Ref Range Status   Specimen Description URINE, CATHETERIZED  Final   Special Requests NONE  Final   Culture (A)  Final    >=100,000 COLONIES/mL KLEBSIELLA PNEUMONIAE 80,000 COLONIES/mL PSEUDOMONAS AERUGINOSA SUSCEPTIBILITIES TO FOLLOW Performed at [facility], [place] [address] [place], [place] [address]    Report Status PENDING  Incomplete     RN Pressure Injury  Documentation:     Estimated body mass index is 17.33 kg/m as calculated from the following:   Height as of 05/28/21: 6' 2" (1.88 m).   Weight as of 05/28/21: 61.2 kg.  Malnutrition Type:   Malnutrition Characteristics:   Nutrition Interventions:   Radiology Studies: DG CHEST PORT 1 VIEW  Result Date: 06/23/2021 CLINICAL DATA:  81 year old male with shortness of breath. EXAM: PORTABLE CHEST 1 VIEW COMPARISON:  Portable chest 06/22/2021 and earlier. FINDINGS: Portable AP semi upright view at 0558 hours. Stable left chest AICD. Other postoperative changes to the mediastinum. Cardiac size at the upper limits of normal. Other mediastinal contours are within normal limits. Visualized tracheal air column is within normal limits. Residual patchy left upper lobe opacity, not significantly changed from the CT 06/21/2021. Elsewhere Allowing for portable technique the lungs are clear. No acute osseous abnormality identified. IMPRESSION: 1. Patchy left upper lobe opacity, presumed pneumonia, not significantly changed from the CT 06/21/2021. 2. No new cardiopulmonary abnormality. Electronically Signed   By: Genevie Ann M.D.   On: 06/23/2021 08:37   DG CHEST PORT 1 VIEW  Result Date: 06/22/2021 CLINICAL DATA:  Shortness of breath EXAM: PORTABLE CHEST 1 VIEW COMPARISON:  CTA of 1 day prior.  Plain film 06/20/2021 FINDINGS: Pacer/AICD device with leads at right atrium and right ventricle. Patient rotated to the right. Prior median sternotomy. Numerous leads and wires project over the chest. Mild cardiomegaly. Atherosclerosis in the transverse aorta. No pleural effusion or pneumothorax. No congestive failure. Improved to resolved left upper lobe airspace disease. Suspect persistent bibasilar atelectasis. IMPRESSION: Improved to resolved left upper lobe airspace disease. Cardiomegaly without congestive failure. Aortic Atherosclerosis (  ICD10-I70.0). Electronically Signed   By: Abigail Miyamoto M.D.   On: 06/22/2021 08:42     Scheduled Meds:  Chlorhexidine Gluconate Cloth  6 each Topical Q0600   clopidogrel  75 mg Oral QHS   divalproex  125 mg Oral QHS   finasteride  5 mg Oral q morning   guaiFENesin  1,200 mg Oral BID   ipratropium  0.5 mg Nebulization Q6H   levalbuterol  0.63 mg Nebulization Q6H   methylPREDNISolone (SOLU-MEDROL) injection  120 mg Intravenous Q24H   pantoprazole  40 mg Oral Daily   phosphorus  500 mg Oral BID   rosuvastatin  10 mg Oral q morning   Continuous Infusions:  azithromycin 500 mg (06/22/21 2306)   cefTRIAXone (ROCEPHIN)  IV 1 g (06/22/21 2200)    LOS: 2 days   Kerney Elbe, DO [facility] PAGER is on AMION  If 7PM-7AM, please contact night-coverage [email or web address]

## 2021-06-24 ENCOUNTER — Inpatient Hospital Stay (HOSPITAL_COMMUNITY): Payer: Medicare Other

## 2021-06-24 LAB — COMPREHENSIVE METABOLIC PANEL
ALT: 47 U/L — ABNORMAL HIGH (ref 0–44)
AST: 33 U/L (ref 15–41)
Albumin: 2.7 g/dL — ABNORMAL LOW (ref 3.5–5.0)
Alkaline Phosphatase: 116 U/L (ref 38–126)
Anion gap: 7 (ref 5–15)
BUN: 18 mg/dL (ref 8–23)
CO2: 26 mmol/L (ref 22–32)
Calcium: 9 mg/dL (ref 8.9–10.3)
Chloride: 105 mmol/L (ref 98–111)
Creatinine, Ser: 0.76 mg/dL (ref 0.61–1.24)
GFR, Estimated: >60 mL/min (ref >60)
Glucose, Bld: 158 mg/dL — ABNORMAL HIGH (ref 70–99)
Potassium: 3.9 mmol/L (ref 3.5–5.1)
Sodium: 138 mmol/L (ref 135–145)
Total Bilirubin: 0.2 mg/dL — ABNORMAL LOW (ref 0.3–1.2)
Total Protein: 6.3 g/dL — ABNORMAL LOW (ref 6.5–8.1)

## 2021-06-24 LAB — CBC WITH DIFFERENTIAL/PLATELET
Abs Immature Granulocytes: 0.01 10*3/uL (ref 0.00–0.07)
Basophils Absolute: 0 10*3/uL (ref 0.0–0.1)
Basophils Relative: 0 %
Eosinophils Absolute: 0 10*3/uL (ref 0.0–0.5)
Eosinophils Relative: 0 %
HCT: 34.7 % — ABNORMAL LOW (ref 39.0–52.0)
Hemoglobin: 10.4 g/dL — ABNORMAL LOW (ref 13.0–17.0)
Immature Granulocytes: 0 %
Lymphocytes Relative: 21 %
Lymphs Abs: 0.8 10*3/uL (ref 0.7–4.0)
MCH: 23.6 pg — ABNORMAL LOW (ref 26.0–34.0)
MCHC: 30 g/dL (ref 30.0–36.0)
MCV: 78.9 fL — ABNORMAL LOW (ref 80.0–100.0)
Monocytes Absolute: 0.2 10*3/uL (ref 0.1–1.0)
Monocytes Relative: 4 %
Neutro Abs: 2.8 10*3/uL (ref 1.7–7.7)
Neutrophils Relative %: 75 %
Platelets: 132 10*3/uL — ABNORMAL LOW (ref 150–400)
RBC: 4.4 MIL/uL (ref 4.22–5.81)
RDW: 21.8 % — ABNORMAL HIGH (ref 11.5–15.5)
WBC: 3.7 10*3/uL — ABNORMAL LOW (ref 4.0–10.5)
nRBC: 0 % (ref 0.0–0.2)

## 2021-06-24 LAB — URINE CULTURE: Culture: >=100000 — AB

## 2021-06-24 LAB — MAGNESIUM: Magnesium: 2.1 mg/dL (ref 1.7–2.4)

## 2021-06-24 LAB — PHOSPHORUS: Phosphorus: 2.8 mg/dL (ref 2.5–4.6)

## 2021-06-24 MED ORDER — PREDNISONE 20 MG PO TABS: 40.0000 mg | ORAL_TABLET | Freq: Every day | ORAL | 0 refills | Status: AC

## 2021-06-24 MED ORDER — CEFDINIR 300 MG PO CAPS
300.0000 mg | ORAL_CAPSULE | Freq: Two times a day (BID) | ORAL | 0 refills | Status: AC
Start: 2021-06-24 — End: 2021-06-29

## 2021-06-24 MED ORDER — SACCHAROMYCES BOULARDII 250 MG PO CAPS: 250.0000 mg | ORAL_CAPSULE | Freq: Two times a day (BID) | ORAL | 0 refills | Status: AC

## 2021-06-24 MED ORDER — FINASTERIDE 5 MG PO TABS: 5.0000 mg | ORAL_TABLET | Freq: Every morning | ORAL | 2 refills | Status: AC

## 2021-06-24 MED ORDER — ALBUTEROL SULFATE HFA 108 (90 BASE) MCG/ACT IN AERS: 2.0000 | INHALATION_SPRAY | Freq: Four times a day (QID) | RESPIRATORY_TRACT | 0 refills | Status: DC | PRN

## 2021-06-24 MED ORDER — GUAIFENESIN ER 600 MG PO TB12: 1200.0000 mg | ORAL_TABLET | Freq: Two times a day (BID) | ORAL | 0 refills | Status: AC

## 2021-06-24 NOTE — Progress Notes (Signed)
RN + NT at bedside. Patient appears alert and cooperative. Patient telemonitoring connections disconnected, IV removed. Patient tolerated well. Patient gown removed and changed to personal clothes. Patient taken to discharge tower by [employer]

## 2021-06-24 NOTE — Discharge Summary (Signed)
Physician Discharge Summary  [name]:[id number] DOB: Apr 10, 1941 DOA: 06/20/2021  PCP: Fredrich Romans, PA  Admit date: 06/20/2021 Discharge date: 06/24/2021  Admitted From: Home Discharge disposition: Home with home health OT and PT   Code Status: Full Code   Discharge Diagnosis:   Principal Problem:   CAP (community acquired pneumonia) Active Problems:   Hyperlipidemia   BPH (benign prostatic hyperplasia)   Chronic systolic heart failure (HCC)   Severe sepsis ([place])   SOB (shortness of breath)   GERD (gastroesophageal reflux disease)    Chief Complaint  Patient presents with   Shortness of Breath    Brief narrative: Todd Dunn is a 81 y.o. male with PMH significant for HTN, HLD, CAD, SSS s/p PPM, CVA with residual dysphagia, dysarthria ; BPH, neurogenic bladder, chronic indwelling Foley catheter. Patient presented to the ED on 1/14 with complaint of shortness of breath, cough, subjective fever for 2 days  In the ED, patient was afebrile, but noted to have oxygen saturation low at 85% on room air and she started on supplemental oxygen by nasal cannula. CT angio chest did not show any acute pulm embolism but showed left upper lobe infiltrate concerning for pneumonia Started on IV Rocephin, azithromycin, IV steroids, Lasix Admitted to hospitalist service for community-acquired pneumonia Urinalysis on 1/16 showed positive nitrate, trace leukocytes, many bacteria Urine culture subsequently grew more than 100,000 CFU per mL of Klebsiella pneumonia and Pseudomonas aeruginosa.  Subjective: Patient was seen and examined this morning.  Pleasant elderly African-American male.  Propped up in bed.  Not in distress.  Baseline dysarthria, knows he is in the hospital.  Cheerful.  Family not at bedside. Chart reviewed In last 24 hours, no fever, heart rate in 70s and 80s, blood pressure mostly 130s, breathing on room air Labs this morning compared to yesterday with  improved phosphorus, otherwise stable.  Hospital course: Community-acquired pneumonia -Presented with progressively worsening shortness of breath, cough, subjective fever at home -CT chest with left upper lobe infiltrate. -On admission, patient had WBC count slightly low at 3.9, normal lactic acid.  Did not meet criteria for sepsis -Blood culture did not show any growth so far. -Currently improving on IV Rocephin and IV azithromycin.  Discharged on 5 more days of oral Omnicef with probiotics. Recent Labs  Lab 06/20/21 2104 06/21/21 0322 06/21/21 0446 06/21/21 0459 06/22/21 0854 06/23/21 1005 06/24/21 0121  WBC 3.9* 3.9*  --   --  5.6 5.3 3.7*  LATICACIDVEN  --   --  1.2  --   --   --   --   PROCALCITON  --   --   --  <0.10  --   --   --    Acute respiratory failure with hypoxia COPD exacerbation -History of smoking.  Mild emphysematous changes noted in chest CT. -Currently on bronchodilators and IV Solu-Medrol 120 mg daily.  No wheezing on auscultation.  Discharged with 5 days of oral prednisone and rescue inhaler. -Continue -Incentive spirometry -Initially required 2 L oxygen by nasal cannula.  Currently on room air.  BPH/neurogenic bladder with chronic Foley catheter Bacterial colonization -Patient did not have any urinary symptoms.  Urine culture with more than 1000 CFU per mL of Klebsiella pneumonia and Pseudomonas -Per previous hospitalist, the urine culture report was discussed with ID Dr. Gale Journey.  Recommended no antibiotic treatment at this time. -Foley catheter was last changed on 06/22/2021 -Continue finasteride.  Chronic combined systolic and diastolic CHF -Most recent echo  from 03/2021 showed an EF of 30 to 35% with LV global hypokinesis and mildly dilated left ventricle and evidence of grade 1 diastolic dysfunction -Home meds include amlodipine 10 mg daily.  I do not see any diuretics in the list.   -1 dose of Lasix was given earlier in the course. -Currently looks  euvolemic.  History of CVA with residual dysarthria, dysphagia Hyperlipidemia -Continue with statin and clopidogrel -Continue with divalproex 1.5 mg p.o. nightly  Hypophosphatemia -Phosphorus level was low yesterday.  Improved with replacement. Recent Labs  Lab 06/21/21 0322 06/21/21 0447 06/22/21 0854 06/23/21 1005 06/24/21 0121  K 3.8 3.9 3.9 3.5 3.9  MG 2.0  --  2.1 2.1 2.1  PHOS 2.5  --  3.2 1.7* 2.8   GERD/GI Prophylaxis -Continue PPI  Mobility: Wheelchair-bound at baseline, will transfer in and out of it. Living condition: Lives at home Goals of care:   Code Status: Full Code  Nutritional status: Body mass index is 16.13 kg/m.       Discharge Medications:   Allergies as of 06/24/2021       Reactions   Food Anaphylaxis, Rash   Clams   Aspirin Itching   [employer] (m. [employer]enaria) Skin Test Itching        Medication List     TAKE these medications    acetaminophen 325 MG tablet Commonly known as: TYLENOL Take 650 mg by mouth every 4 (four) hours as needed for mild pain.   albuterol 108 (90 Base) MCG/ACT inhaler Commonly known as: VENTOLIN HFA Inhale 2 puffs into the lungs every 6 (six) hours as needed for wheezing or shortness of breath.   amLODipine 10 MG tablet Commonly known as: NORVASC Take 10 mg by mouth daily.   cefdinir 300 MG capsule Commonly known as: OMNICEF Take 1 capsule (300 mg total) by mouth 2 (two) times daily for 5 days.   clopidogrel 75 MG tablet Commonly known as: PLAVIX Take 75 mg by mouth at bedtime.   divalproex 125 MG DR tablet Commonly known as: DEPAKOTE Take 125 mg by mouth at bedtime.   finasteride 5 MG tablet Commonly known as: PROSCAR Take 5 mg by mouth every morning.   guaiFENesin 600 MG 12 hr tablet Commonly known as: MUCINEX Take 2 tablets (1,200 mg total) by mouth 2 (two) times daily for 7 days.   HYDROcodone-acetaminophen 5-325 MG tablet Commonly known as: NORCO/VICODIN Take 1-2 tablets by  mouth every 4 (four) hours as needed for moderate pain (pain score 4-6).   nitroGLYCERIN 0.4 MG SL tablet Commonly known as: NITROSTAT Place 0.4 mg under the tongue every 5 (five) minutes x 3 doses as needed for chest pain.   pantoprazole 40 MG tablet Commonly known as: PROTONIX Take 1 tablet (40 mg total) by mouth daily.   polyethylene glycol 17 g packet Commonly known as: MIRALAX / GLYCOLAX Take 17 g by mouth daily as needed for mild constipation.   predniSONE 20 MG tablet Commonly known as: DELTASONE Take 2 tablets (40 mg total) by mouth daily for 5 days.   rosuvastatin 10 MG tablet Commonly known as: CRESTOR Take 10 mg by mouth every morning.   saccharomyces boulardii 250 MG capsule Commonly known as: FLORASTOR Take 1 capsule (250 mg total) by mouth 2 (two) times daily for 5 days.   vitamin B-12 1000 MCG tablet Commonly known as: CYANOCOBALAMIN Take 1 tablet (1,000 mcg total) by mouth daily.   Vitamin D (Ergocalciferol) 1.25 MG (50000 UNIT) Caps  capsule Commonly known as: DRISDOL Take 50,000 Units by mouth every Monday.        Wound care:   Wound / Incision (Open or Dehisced) 03/21/21 Other (Comment) Toe (Comment  which one) Right;Posterior purple, intact (Active)  Date First Assessed/Time First Assessed: 03/21/21 0010   Wound Type: (c) Other (Comment)  Location: (c) Toe (Comment  which one)  Location Orientation: Right;Posterior  Wound Description (Comments): purple, intact  Present on Admission: Yes    Assessments 03/21/2021 12:10 AM 04/05/2021 10:27 AM  Dressing Type -- Negative pressure wound therapy  Dressing Status None Clean;Dry;Intact  Site / Wound Assessment Purple Dressing in place / Unable to assess  Peri-wound Assessment Intact;Purple;Other (Comment) Intact  Drainage Amount -- None  Treatment Other (Comment) --     No Linked orders to display     Incision (Closed) 03/26/21 Groin Right (Active)  Date First Assessed/Time First Assessed: 03/26/21  1049   Location: Groin  Location Orientation: Right    Assessments 03/26/2021 11:14 AM 04/04/2021  9:00 PM  Dressing Type Liquid skin adhesive Liquid skin adhesive  Dressing Clean;Dry;Intact Clean;Dry;Intact  Site / Wound Assessment Clean;Dry Clean;Dry  Margins Attached edges (approximated) Attached edges (approximated)  Closure Skin glue Skin glue  Drainage Amount None None     No Linked orders to display     Incision (Closed) 03/26/21 Leg Right (Active)  Date First Assessed/Time First Assessed: 03/26/21 1049   Location: Leg  Location Orientation: Right    Assessments 03/26/2021 11:14 AM 04/04/2021  9:35 AM  Dressing Type Liquid skin adhesive Liquid skin adhesive  Dressing Clean;Dry;Intact Clean;Dry;Intact  Site / Wound Assessment Clean;Dry Clean;Dry  Margins Attached edges (approximated) Attached edges (approximated)  Closure Skin glue Skin glue  Drainage Amount None None     No Linked orders to display     Incision (Closed) 04/01/21 Foot Right (Active)  Date First Assessed/Time First Assessed: 04/01/21 1131   Location: Foot  Location Orientation: Right    Assessments 04/01/2021  9:35 AM 04/05/2021 10:27 AM  Dressing Type Negative pressure wound therapy Negative pressure wound therapy  Dressing Clean;Dry;Intact Clean;Dry;Intact  Site / Wound Assessment -- Dressing in place / Unable to assess     No Linked orders to display     Negative Pressure Wound Therapy Foot Anterior;Right;Circumferential (Active)  Placement Date/Time: 04/01/21 1208   Wound Type: Incision (Closed wound)  Location: Foot  Location Orientation: Anterior;Right;Circumferential    Assessments 04/01/2021  9:35 AM 04/05/2021 10:27 AM  Site / Wound Assessment Clean;Dry Dressing in place / Unable to assess  Cycle Continuous Continuous  Target Pressure (mmHg) 125 125  Canister Changed -- No  Machine plugged into wall outlet (NOT bed outlet) -- Yes  Dressing Status Intact Intact  Drainage Amount None None      No Linked orders to display    Discharge Instructions:   Discharge Instructions     Call MD for:  difficulty breathing, headache or visual disturbances   Complete by: As directed    Call MD for:  extreme fatigue   Complete by: As directed    Call MD for:  hives   Complete by: As directed    Call MD for:  persistant dizziness or light-headedness   Complete by: As directed    Call MD for:  persistant nausea and vomiting   Complete by: As directed    Call MD for:  severe uncontrolled pain   Complete by: As directed    Call MD for:  temperature >100.4   Complete by: As directed    Diet general   Complete by: As directed    Discharge instructions   Complete by: As directed    General discharge instructions:  Follow with Primary MD Fredrich Romans, PA in 7 days   Get CBC/BMP checked in next visit within 1 week by PCP or SNF MD. (We routinely change or add medications that can affect your baseline labs and fluid status, therefore we recommend that you get the mentioned basic workup next visit with your PCP, your PCP may decide not to get them or add new tests based on their clinical decision)  On your next visit with your PCP, please get your medicines reviewed and adjusted.  Please request your PCP  to go over all hospital tests, procedures, radiology results at the follow up, please get all Hospital records sent to your PCP by signing hospital release before you go home.  Activity: As tolerated with Full fall precautions use walker/cane & assistance as needed  Avoid using any recreational substances like cigarette, tobacco, alcohol, or non-prescribed drug.  If you experience worsening of your admission symptoms, develop shortness of breath, life threatening emergency, suicidal or homicidal thoughts you must seek medical attention immediately by calling 911 or calling your MD immediately  if symptoms less severe.  You must read complete instructions/literature along with all the  possible adverse reactions/side effects for all the medicines you take and that have been prescribed to you. Take any new medicine only after you have completely understood and accepted all the possible adverse reactions/side effects.   Do not drive, operate heavy machinery, perform activities at heights, swimming or participation in water activities or provide baby sitting services if your were admitted for syncope or siezures until you have seen by Primary MD or a Neurologist and advised to do so again.  Do not drive when taking Pain medications.  Do not take more than prescribed Pain, Sleep and Anxiety Medications  Wear Seat belts while driving.  Please note You were cared for by a hospitalist during your hospital stay. If you have any questions about your discharge medications or the care you received while you were in the hospital after you are discharged, you can call the unit and asked to speak with the hospitalist on call if the hospitalist that took care of you is not available. Once you are discharged, your primary care physician will handle any further medical issues. Please note that NO REFILLS for any discharge medications will be authorized once you are discharged, as it is imperative that you return to your primary care physician (or establish a relationship with a primary care physician if you do not have one) for your aftercare needs so that they can reassess your need for medications and monitor your lab values.   Increase activity slowly   Complete by: As directed        Follow ups:    Follow-up [place], [facility] Follow up.   Why: For home health services (formerly known as [facility]) Contact information: [place] [address] [phone]         Fredrich Romans, [place] Follow up.   Specialty: Physician Assistant Contact information: [place] [address] [phone]          Jettie Booze, MD .   Specialties: Cardiology, Radiology, Interventional Cardiology Contact information: [address]  [place] [address] [phone]                 Discharge Exam:   Vitals:   06/24/21 0400 06/24/21 0717 06/24/21 0743 06/24/21 0751  BP: 134/72     Pulse: (!) 121  73   Resp:   18   Temp: 98.4 F (36.9 C)  98.4 F (36.9 C)   TempSrc: Oral  Oral   SpO2:    95%  Weight:  57 kg      Body mass index is 16.13 kg/m.  General exam: Pleasant, elderly African-American male, propped up in bed.  Not in physical distress Skin: No rashes, lesions or ulcers. HEENT: Atraumatic, normocephalic, no obvious bleeding Lungs: Diminished air entry in both bases, no wheezing or crackles.  Not on supplemental oxygen CVS: Regular rate and rhythm, no murmur GI/Abd soft, nontender, nondistended, bowel sound present CNS: Alert, awake, dysarthric from previous stroke, knows he is in the hospital Psychiatry: Cheerful Extremities: No pedal edema, no calf tenderness  Time coordinating discharge: 35 minutes   The results of significant diagnostics from this hospitalization (including imaging, microbiology, ancillary and laboratory) are listed below for reference.    Procedures and Diagnostic Studies:   DG Chest 2 View  Result Date: 06/20/2021 CLINICAL DATA:  Altered mental status with worsening shortness of breath. EXAM: CHEST - 2 VIEW COMPARISON:  May 28, 2021 FINDINGS: There is a dual lead AICD with stable lead wire positioning. Multiple sternal wires and vascular clips are noted. Very mild, bilateral infrahilar atelectasis is seen. There is no evidence of acute infiltrate, pleural effusion or pneumothorax. The heart size and mediastinal contours are within normal limits. Moderate severity calcification of the aortic arch is noted. Multilevel degenerative changes seen throughout the thoracic spine. IMPRESSION: 1. Evidence of prior median  sternotomy/CABG. 2. Very mild, bilateral infrahilar atelectasis. Electronically Signed   By: Virgina Norfolk M.D.   On: 06/20/2021 21:46   CT Angio Chest PE W and/or Wo Contrast  Result Date: 06/21/2021 CLINICAL DATA:  Increasing shortness of breath today EXAM: CT ANGIOGRAPHY CHEST WITH CONTRAST TECHNIQUE: Multidetector CT imaging of the chest was performed using the standard protocol during bolus administration of intravenous contrast. Multiplanar CT image reconstructions and MIPs were obtained to evaluate the vascular anatomy. RADIATION DOSE REDUCTION: This exam was performed according to the departmental dose-optimization program which includes automated exposure control, adjustment of the mA and/or kV according to patient size and/or use of iterative reconstruction technique. CONTRAST:  76mL OMNIPAQUE IOHEXOL 350 MG/ML SOLN COMPARISON:  Plain film from the previous day. FINDINGS: Cardiovascular: Atherosclerotic calcifications of the thoracic aorta are noted. No aneurysmal dilatation is seen. Heart is mildly enlarged in size. Coronary calcifications are noted. The pulmonary artery shows a normal branching pattern. No intraluminal filling defect to suggest pulmonary embolism is noted. Defibrillator is again noted. Mediastinum/Nodes: Thoracic inlet is within normal limits. No sizable hilar or mediastinal adenopathy is noted. The esophagus as visualized is within normal limits. Lungs/Pleura: Lungs are well aerated bilaterally. Patchy infiltrate is noted in the left upper lobe consistent with acute pneumonia. Mild emphysematous changes are seen. No sizable parenchymal nodule is noted. No sizable effusion is seen. Upper Abdomen: Visualized upper abdomen is within normal limits. Musculoskeletal: Degenerative changes of the thoracic spine are noted. Review of the MIP images confirms the above findings. IMPRESSION: No evidence of pulmonary emboli. Patchy left upper lobe infiltrate consistent with acute pneumonia.  Aortic Atherosclerosis (ICD10-I70.0) and Emphysema (ICD10-J43.9). Electronically Signed  By: Inez Catalina M.D.   On: 06/21/2021 00:53     Labs:   Basic Metabolic Panel: Recent Labs  Lab 06/20/21 2104 06/21/21 0322 06/21/21 0447 06/22/21 0854 06/23/21 1005 06/24/21 0121  NA 139 138 139 139 138 138  K 3.7 3.8 3.9 3.9 3.5 3.9  CL 106 102  --  105 106 105  CO2 25 26  --  25 28 26   GLUCOSE 120* 216*  --  172* 165* 158*  BUN 16 16  --  20 16 18   CREATININE 0.77 0.75  --  0.79 0.81 0.76  CALCIUM 9.8 9.4  --  9.8 9.1 9.0  MG  --  2.0  --  2.1 2.1 2.1  PHOS  --  2.5  --  3.2 1.7* 2.8   GFR Estimated Creatinine Clearance: 57.4 mL/min (by C-G formula based on SCr of 0.76 mg/dL). Liver Function Tests: Recent Labs  Lab 06/21/21 0322 06/22/21 0854 06/23/21 1005 06/24/21 0121  AST 24 28 36 33  ALT 29 31 46* 47*  ALKPHOS 124 109 114 116  BILITOT 0.6 0.3 0.3 0.2*  PROT 6.9 6.6 6.3* 6.3*  ALBUMIN 2.8* 2.6* 2.6* 2.7*   No results for input(s): LIPASE, AMYLASE in the last 168 hours. No results for input(s): AMMONIA in the last 168 hours. Coagulation profile No results for input(s): INR, PROTIME in the last 168 hours.  CBC: Recent Labs  Lab 06/20/21 2104 06/21/21 0322 06/21/21 0447 06/22/21 0854 06/23/21 1005 06/24/21 0121  WBC 3.9* 3.9*  --  5.6 5.3 3.7*  NEUTROABS 2.3 2.1  --  4.7 3.7 2.8  HGB 10.7* 11.0* 12.2* 10.2* 10.2* 10.4*  HCT 36.9* 37.7* 36.0* 34.1* 34.9* 34.7*  MCV 81.6 82.1  --  81.4 80.8 78.9*  PLT 171 178  --  181 192 132*   Cardiac Enzymes: No results for input(s): CKTOTAL, CKMB, CKMBINDEX, TROPONINI in the last 168 hours. BNP: Invalid input(s): POCBNP CBG: No results for input(s): GLUCAP in the last 168 hours. D-Dimer No results for input(s): DDIMER in the last 72 hours. Hgb A1c No results for input(s): HGBA1C in the last 72 hours. Lipid Profile No results for input(s): CHOL, HDL, LDLCALC, TRIG, CHOLHDL, LDLDIRECT in the last 72 hours. Thyroid  function studies No results for input(s): TSH, T4TOTAL, T3FREE, THYROIDAB in the last 72 hours.  Invalid input(s): FREET3 Anemia work up No results for input(s): VITAMINB12, FOLATE, FERRITIN, TIBC, IRON, RETICCTPCT in the last 72 hours. Microbiology Recent Results (from the past 240 hour(s))  Resp Panel by RT-PCR (Flu A&B, Covid) Nasopharyngeal Swab     Status: None   Collection Time: 06/20/21 10:12 PM   Specimen: Nasopharyngeal Swab; Nasopharyngeal(NP) swabs in vial transport medium  Result Value Ref Range Status   SARS Coronavirus 2 by RT PCR NEGATIVE NEGATIVE Final    Comment: (NOTE) SARS-CoV-2 target nucleic acids are NOT DETECTED.  The SARS-CoV-2 RNA is generally detectable in upper respiratory specimens during the acute phase of infection. The lowest concentration of SARS-CoV-2 viral copies this assay can detect is 138 copies/mL. A negative result does not preclude SARS-Cov-2 infection and should not be used as the sole basis for treatment or other patient management decisions. A negative result may occur with  improper specimen collection/handling, submission of specimen other than nasopharyngeal swab, presence of viral mutation(s) within the areas targeted by this assay, and inadequate number of viral copies(<138 copies/mL). A negative result must be combined with clinical observations, patient history, and epidemiological information.  The expected result is Negative.  Fact Sheet for Patients:  EntrepreneurPulse.com.au  Fact Sheet for Healthcare Providers:  IncredibleEmployment.be  This test is no t yet approved or cleared by the [place] FDA and  has been authorized for detection and/or diagnosis of SARS-CoV-2 by FDA under an Emergency Use Authorization (EUA). This EUA will remain  in effect (meaning this test can be used) for the duration of the COVID-19 declaration under Section 564(b)(1) of the Act, 21 U.S.C.section  360bbb-3(b)(1), unless the authorization is terminated  or revoked sooner.       Influenza A by PCR NEGATIVE NEGATIVE Final   Influenza B by PCR NEGATIVE NEGATIVE Final    Comment: (NOTE) The Xpert Xpress SARS-CoV-2/FLU/RSV plus assay is intended as an aid in the diagnosis of influenza from Nasopharyngeal swab specimens and should not be used as a sole basis for treatment. Nasal washings and aspirates are unacceptable for Xpert Xpress SARS-CoV-2/FLU/RSV testing.  Fact Sheet for Patients: EntrepreneurPulse.com.au  Fact Sheet for Healthcare Providers: IncredibleEmployment.be  This test is not yet approved or cleared by the [place] FDA and has been authorized for detection and/or diagnosis of SARS-CoV-2 by FDA under an Emergency Use Authorization (EUA). This EUA will remain in effect (meaning this test can be used) for the duration of the COVID-19 declaration under Section 564(b)(1) of the Act, 21 U.S.C. section 360bbb-3(b)(1), unless the authorization is terminated or revoked.  Performed at [facility], [place] [address], [place], [place] [address]   Culture, blood (routine x 2)     Status: None (Preliminary result)   Collection Time: 06/21/21  1:20 AM   Specimen: BLOOD LEFT FOREARM  Result Value Ref Range Status   Specimen Description BLOOD LEFT FOREARM  Final   Special Requests   Final    BOTTLES DRAWN AEROBIC ONLY Blood Culture results may not be optimal due to an inadequate volume of blood received in culture bottles   Culture   Final    NO GROWTH 3 DAYS Performed at [facility], [place] [address] [place], [place] [address]    Report Status PENDING  Incomplete  Culture, blood (routine x 2)     Status: None (Preliminary result)   Collection Time: 06/21/21  1:25 AM   Specimen: BLOOD  Result Value Ref Range Status   Specimen Description BLOOD RIGHT ANTECUBITAL  Final   Special Requests   Final    BOTTLES DRAWN AEROBIC  AND ANAEROBIC Blood Culture adequate volume   Culture   Final    NO GROWTH 3 DAYS Performed at [facility], [place] [address], [place], [place] [address]    Report Status PENDING  Incomplete  Urine Culture     Status: Abnormal   Collection Time: 06/21/21  5:26 PM   Specimen: Urine, Catheterized  Result Value Ref Range Status   Specimen Description URINE, CATHETERIZED  Final   Special Requests NONE  Final   Culture (A)  Final    >=100,000 COLONIES/mL KLEBSIELLA PNEUMONIAE 80,000 COLONIES/mL PSEUDOMONAS AERUGINOSA Confirmed Extended Spectrum Beta-Lactamase Producer (ESBL).  In bloodstream infections from ESBL organisms, carbapenems are preferred over piperacillin/tazobactam. They are shown to have a lower risk of mortality. FOR KLEBSIELLA PNEUMONIAE Performed at [facility], [place] [address] [place], [place] [address]    Report Status 06/24/2021 FINAL  Final   Organism ID, Bacteria KLEBSIELLA PNEUMONIAE (A)  Final   Organism ID, Bacteria PSEUDOMONAS AERUGINOSA (A)  Final  Susceptibility   Klebsiella pneumoniae - MIC*    AMPICILLIN >=32 RESISTANT Resistant     CEFAZOLIN >=64 RESISTANT Resistant     CEFEPIME >=32 RESISTANT Resistant     CEFTRIAXONE >=64 RESISTANT Resistant     CIPROFLOXACIN 2 RESISTANT Resistant     GENTAMICIN >=16 RESISTANT Resistant     IMIPENEM <=0.25 SENSITIVE Sensitive     NITROFURANTOIN 128 RESISTANT Resistant     TRIMETH/SULFA >=320 RESISTANT Resistant     AMPICILLIN/SULBACTAM >=32 RESISTANT Resistant     PIP/TAZO 16 SENSITIVE Sensitive     * >=100,000 COLONIES/mL KLEBSIELLA PNEUMONIAE   Pseudomonas aeruginosa - MIC*    CEFTAZIDIME 2 SENSITIVE Sensitive     CIPROFLOXACIN <=0.25 SENSITIVE Sensitive     GENTAMICIN <=1 SENSITIVE Sensitive     IMIPENEM 2 SENSITIVE Sensitive     PIP/TAZO 8 SENSITIVE Sensitive     CEFEPIME 2 SENSITIVE Sensitive     * 80,000 COLONIES/mL PSEUDOMONAS AERUGINOSA     Signed: Annajulia Lewing  [facility] 06/24/2021, 10:05 AM

## 2021-06-24 NOTE — Plan of Care (Signed)
°  Problem: Education: Goal: Knowledge of General Education information will improve Description: Including pain rating scale, medication(s)/side effects and non-pharmacologic comfort measures Outcome: Progressing   Problem: Health Behavior/Discharge Planning: Goal: Ability to manage health-related needs will improve Outcome: Progressing   Problem: Clinical Measurements: Goal: Ability to maintain clinical measurements within normal limits will improve Outcome: Progressing Goal: Will remain free from infection Outcome: Progressing Goal: Diagnostic test results will improve Outcome: Progressing Goal: Respiratory complications will improve Outcome: Progressing Goal: Cardiovascular complication will be avoided Outcome: Progressing   Problem: Activity: Goal: Risk for activity intolerance will decrease Outcome: Progressing   Problem: Nutrition: Goal: Adequate nutrition will be maintained Outcome: Progressing   Problem: Coping: Goal: Level of anxiety will decrease Outcome: Progressing   Problem: Elimination: Goal: Will not experience complications related to bowel motility Outcome: Progressing Goal: Will not experience complications related to urinary retention Outcome: Progressing   Problem: Safety: Goal: Ability to remain free from injury will improve Outcome: Progressing   Problem: Skin Integrity: Goal: Risk for impaired skin integrity will decrease Outcome: Progressing   Problem: Education: Goal: Ability to demonstrate management of disease process will improve Outcome: Progressing Goal: Ability to verbalize understanding of medication therapies will improve Outcome: Progressing Goal: Individualized Educational Video(s) Outcome: Progressing   Problem: Activity: Goal: Capacity to carry out activities will improve Outcome: Progressing   Problem: Cardiac: Goal: Ability to achieve and maintain adequate cardiopulmonary perfusion will improve Outcome: Progressing

## 2021-06-24 NOTE — TOC Transition Note (Signed)
Transition of Care [facility]) - CM/SW Discharge Note   Patient Details  Name: Todd Dunn MRN: [id number] Date of Birth: 1941/05/17  [facility] CM/SW Contact:  Cyndi Bender, RN Phone Number: 06/24/2021, 11:27 AM   Clinical Narrative:    Patient stable for discharge. Spoke to granddaughter, [name]. Notified her of home health orders and discharge. [name] states she will have someone pick up patient.  No other TOC needs.    Final next level of care: [place] Barriers to Discharge: Barriers Resolved   Patient Goals and CMS Choice Patient states their goals for this hospitalization and ongoing recovery are:: return home CMS Medicare.gov Compare Post Acute Care list provided to:: Other (Comment Required) Choice offered to / list presented to : Adult Children  Discharge Placement                 Home       Discharge Plan and [place] with [place] Agency: [place] ([place]) Date [place]: 06/23/21 Time [place]: 1247 Representative spoke with at [place]: [place] ([place]) Interventions     Readmission Risk Interventions Readmission Risk Prevention Plan 06/24/2021 02/17/2021  Transportation Screening Complete Complete  Medication Review Press photographer) Complete Complete  PCP or Specialist appointment within 3-5 days of discharge Complete Complete  HRI or Home Care Consult Complete Complete  SW Recovery Care/Counseling Consult - Complete  Palliative Care Screening Not Applicable Not [place] Not Applicable Not Applicable

## 2021-06-26 ENCOUNTER — Encounter (HOSPITAL_COMMUNITY): Payer: Self-pay | Admitting: *Deleted

## 2021-06-26 ENCOUNTER — Emergency Department (HOSPITAL_COMMUNITY): Payer: Medicare Other

## 2021-06-26 ENCOUNTER — Other Ambulatory Visit: Payer: Self-pay

## 2021-06-26 ENCOUNTER — Emergency Department (HOSPITAL_COMMUNITY)
Admission: EM | Admit: 2021-06-26 | Discharge: 2021-06-26 | Disposition: A | Discharge status: Home / Self Care | Payer: Medicare Other | Attending: Emergency Medicine | Admitting: Emergency Medicine

## 2021-06-26 DIAGNOSIS — R0602 Shortness of breath: Secondary | ICD-10-CM | POA: Insufficient documentation

## 2021-06-26 DIAGNOSIS — W01119A Fall on same level from slipping, tripping and stumbling with subsequent striking against unspecified sharp object, initial encounter: Secondary | ICD-10-CM | POA: Diagnosis not present

## 2021-06-26 DIAGNOSIS — Z23 Encounter for immunization: Secondary | ICD-10-CM | POA: Diagnosis not present

## 2021-06-26 DIAGNOSIS — S0990XA Unspecified injury of head, initial encounter: Secondary | ICD-10-CM

## 2021-06-26 DIAGNOSIS — Z79899 Other long term (current) drug therapy: Secondary | ICD-10-CM | POA: Diagnosis not present

## 2021-06-26 DIAGNOSIS — S0101XA Laceration without foreign body of scalp, initial encounter: Secondary | ICD-10-CM | POA: Insufficient documentation

## 2021-06-26 LAB — CULTURE, BLOOD (ROUTINE X 2)
Culture: NO GROWTH
Culture: NO GROWTH
Special Requests: ADEQUATE

## 2021-06-26 MED ORDER — TETANUS-DIPHTH-ACELL PERTUSSIS 5-2.5-18.5 LF-MCG/0.5 IM SUSY
0.5000 mL | PREFILLED_SYRINGE | Freq: Once | INTRAMUSCULAR | Status: AC
  Administered 2021-06-26: 0.5 mL via INTRAMUSCULAR

## 2021-06-26 MED ORDER — LIDOCAINE-EPINEPHRINE-TETRACAINE (LET) TOPICAL GEL
3.0000 mL | Freq: Once | TOPICAL | Status: AC
  Administered 2021-06-26: 3 mL via TOPICAL
  Filled 2021-06-26: qty 3

## 2021-06-26 NOTE — ED Notes (Signed)
To ct

## 2021-06-26 NOTE — ED Notes (Signed)
Pt alert on arrival  recent admission to the hospital for pneumonia   he has a foley catheter for almost a year  speech is diffic ult to understand  family at  the bedside reports that this is normal for him

## 2021-06-26 NOTE — ED Notes (Signed)
The pt arrived by pov  he was not supposed to be walking  the family  member left the room and the pt walked out of the room and fell striking his rt forehead against a clay vase  1 inch lac to the  rt forehead  cleaned with soap and water dsd bandage  the pt takes plavix

## 2021-06-26 NOTE — ED Triage Notes (Signed)
The pt fell at home  from walking and struck his fore head on a statue or something clay  1 inch laceration rt forehead  minimal bleeding both pupils equal and react approx size 3.0    no olther injuries.  The pt takes plavia

## 2021-06-26 NOTE — ED Provider Notes (Signed)
[facility] EMERGENCY DEPARTMENT Provider Note   CSN: [id number] Arrival date & time: 06/26/21  1606     History  Chief Complaint  Patient presents with   [name]    NORAH FICK is a 81 y.o. male.  The history is provided by the patient, medical records and a relative. No language interpreter was used.  Fall This is a new problem. The current episode started less than 1 hour ago. The problem occurs rarely. The problem has not changed since onset.Associated symptoms include headaches and shortness of breath. Pertinent negatives include no chest pain and no abdominal pain. Nothing aggravates the symptoms. Nothing relieves the symptoms. He has tried nothing for the symptoms. The treatment provided no relief.      Home Medications Prior to Admission medications   Medication Sig Start Date End Date Taking? Authorizing Provider  acetaminophen (TYLENOL) 325 MG tablet Take 650 mg by mouth every 4 (four) hours as needed for mild pain.    [provider]  albuterol (VENTOLIN HFA) 108 (90 Base) MCG/ACT inhaler Inhale 2 puffs into the lungs every 6 (six) hours as needed for wheezing or shortness of breath. 06/24/21   Terrilee Croak, MD  amLODipine (NORVASC) 10 MG tablet Take 10 mg by mouth daily. 06/05/21   [provider]  cefdinir (OMNICEF) 300 MG capsule Take 1 capsule (300 mg total) by mouth 2 (two) times daily for 5 days. 06/24/21 06/29/21  Terrilee Croak, MD  clopidogrel (PLAVIX) 75 MG tablet Take 75 mg by mouth at bedtime.    [provider]  divalproex (DEPAKOTE) 125 MG DR tablet Take 125 mg by mouth at bedtime.    [provider]  finasteride (PROSCAR) 5 MG tablet Take 1 tablet (5 mg total) by mouth every morning. 06/24/21 09/22/21  Terrilee Croak, MD  guaiFENesin (MUCINEX) 600 MG 12 hr tablet Take 2 tablets (1,200 mg total) by mouth 2 (two) times daily for 7 days. 06/24/21 07/01/21  Terrilee Croak, MD  HYDROcodone-acetaminophen  (NORCO/VICODIN) 5-325 MG tablet Take 1-2 tablets by mouth every 4 (four) hours as needed for moderate pain (pain score 4-6). Patient not taking: Reported on 06/21/2021 04/05/21   Shawna Clamp, MD  nitroGLYCERIN (NITROSTAT) 0.4 MG SL tablet Place 0.4 mg under the tongue every 5 (five) minutes x 3 doses as needed for chest pain.    [provider]  pantoprazole (PROTONIX) 40 MG tablet Take 1 tablet (40 mg total) by mouth daily. 04/06/21   Shawna Clamp, MD  polyethylene glycol (MIRALAX / GLYCOLAX) 17 g packet Take 17 g by mouth daily as needed for mild constipation. 05/14/21   Merrily Brittle, DO  predniSONE (DELTASONE) 20 MG tablet Take 2 tablets (40 mg total) by mouth daily for 5 days. 06/24/21 06/29/21  Terrilee Croak, MD  rosuvastatin (CRESTOR) 10 MG tablet Take 10 mg by mouth every morning.    [provider]  saccharomyces boulardii (FLORASTOR) 250 MG capsule Take 1 capsule (250 mg total) by mouth 2 (two) times daily for 5 days. 06/24/21 06/29/21  Terrilee Croak, MD  vitamin B-12 (CYANOCOBALAMIN) 1000 MCG tablet Take 1 tablet (1,000 mcg total) by mouth daily. 01/08/21   Lincoln Brigham, PA-C  Vitamin D, Ergocalciferol, (DRISDOL) 1.25 MG (50000 UNIT) CAPS capsule Take 50,000 Units by mouth every Monday.    [provider]      Allergies    Food, Aspirin, and [place] clam (m. mercenaria) skin test    Review of Systems  Review of Systems  Constitutional:  Negative for chills, diaphoresis, fatigue and fever.  HENT:  Negative for congestion.   Eyes:  Negative for visual disturbance.  Respiratory:  Positive for shortness of breath. Negative for cough, chest tightness and wheezing.   Cardiovascular:  Negative for chest pain.  Gastrointestinal:  Negative for abdominal pain, constipation, diarrhea, nausea and vomiting.  Genitourinary:  Negative for frequency.  Musculoskeletal:  Negative for back pain, neck pain and neck stiffness.  Skin:  Positive for wound.   Neurological:  Positive for headaches. Negative for dizziness and light-headedness.  Psychiatric/Behavioral:  Negative for agitation and confusion.   All other systems reviewed and are negative.  Physical Exam Updated Vital Signs There were no vitals taken for this visit. Physical Exam Vitals and nursing note reviewed.  Constitutional:      General: He is not in acute distress.    Appearance: He is well-developed. He is not ill-appearing, toxic-appearing or diaphoretic.  HENT:     Head: Laceration present.      Comments: Pupils symmetric and reactive normal extraocular movements.  Symmetric smile.  Clear speech.    Mouth/Throat:     Mouth: Mucous membranes are moist.  Eyes:     Extraocular Movements: Extraocular movements intact.     Conjunctiva/sclera: Conjunctivae normal.     Pupils: Pupils are equal, round, and reactive to light.  Cardiovascular:     Rate and Rhythm: Normal rate and regular rhythm.     Heart sounds: No murmur heard. Pulmonary:     Effort: Pulmonary effort is normal. No respiratory distress.     Breath sounds: Normal breath sounds. No wheezing, rhonchi or rales.  Chest:     Chest wall: No tenderness.  Abdominal:     General: Abdomen is flat.     Palpations: Abdomen is soft.     Tenderness: There is no abdominal tenderness. There is no right CVA tenderness, left CVA tenderness, guarding or rebound.  Musculoskeletal:        General: Tenderness present. No swelling.     Cervical back: Neck supple.  Skin:    General: Skin is warm and dry.     Capillary Refill: Capillary refill takes less than 2 seconds.     Findings: No erythema.  Neurological:     Mental Status: He is alert. Mental status is at baseline.  Psychiatric:        Mood and Affect: Mood normal.    ED Results / Procedures / Treatments   Labs (all labs ordered are listed, but only abnormal results are displayed) Labs Reviewed - No data to display  EKG None  Radiology CT HEAD WO  CONTRAST (5MM)  Result Date: 06/26/2021 CLINICAL DATA:  Fall, laceration to right forehead, blood thinner EXAM: CT HEAD WITHOUT CONTRAST CT CERVICAL SPINE WITHOUT CONTRAST TECHNIQUE: Multidetector CT imaging of the head and cervical spine was performed following the standard protocol without intravenous contrast. Multiplanar CT image reconstructions of the cervical spine were also generated. RADIATION DOSE REDUCTION: This exam was performed according to the departmental dose-optimization program which includes automated exposure control, adjustment of the mA and/or kV according to patient size and/or use of iterative reconstruction technique. COMPARISON:  04/06/2021 FINDINGS: CT HEAD FINDINGS Brain: No evidence of acute infarction, hemorrhage, hydrocephalus, extra-axial collection or mass lesion/mass effect. Periventricular white matter hypodensity. Vascular: No hyperdense vessel or unexpected calcification. Skull: Normal. Negative for fracture or focal lesion. Sinuses/Orbits: No acute finding. Other: Soft tissue laceration of the right forehead  with bandage material. CT CERVICAL SPINE FINDINGS Alignment: Normal. Skull base and vertebrae: No acute fracture. No primary bone lesion or focal pathologic process. Soft tissues and spinal canal: No prevertebral fluid or swelling. No visible canal hematoma. Disc levels: Moderate multilevel disc space height loss and osteophytosis. Upper chest: Negative. Other: None. IMPRESSION: 1. No acute intracranial pathology. Small-vessel white matter disease. 2. Soft tissue laceration of the right forehead with bandage material. 3. No fracture or static subluxation of the cervical spine. 4. Moderate multilevel cervical disc degenerative disease. Electronically Signed   By: Delanna Ahmadi M.D.   On: 06/26/2021 18:21   CT Cervical Spine Wo Contrast  Result Date: 06/26/2021 CLINICAL DATA:  Fall, laceration to right forehead, blood thinner EXAM: CT HEAD WITHOUT CONTRAST CT CERVICAL  SPINE WITHOUT CONTRAST TECHNIQUE: Multidetector CT imaging of the head and cervical spine was performed following the standard protocol without intravenous contrast. Multiplanar CT image reconstructions of the cervical spine were also generated. RADIATION DOSE REDUCTION: This exam was performed according to the departmental dose-optimization program which includes automated exposure control, adjustment of the mA and/or kV according to patient size and/or use of iterative reconstruction technique. COMPARISON:  04/06/2021 FINDINGS: CT HEAD FINDINGS Brain: No evidence of acute infarction, hemorrhage, hydrocephalus, extra-axial collection or mass lesion/mass effect. Periventricular white matter hypodensity. Vascular: No hyperdense vessel or unexpected calcification. Skull: Normal. Negative for fracture or focal lesion. Sinuses/Orbits: No acute finding. Other: Soft tissue laceration of the right forehead with bandage material. CT CERVICAL SPINE FINDINGS Alignment: Normal. Skull base and vertebrae: No acute fracture. No primary bone lesion or focal pathologic process. Soft tissues and spinal canal: No prevertebral fluid or swelling. No visible canal hematoma. Disc levels: Moderate multilevel disc space height loss and osteophytosis. Upper chest: Negative. Other: None. IMPRESSION: 1. No acute intracranial pathology. Small-vessel white matter disease. 2. Soft tissue laceration of the right forehead with bandage material. 3. No fracture or static subluxation of the cervical spine. 4. Moderate multilevel cervical disc degenerative disease. Electronically Signed   By: Delanna Ahmadi M.D.   On: 06/26/2021 18:21   DG Chest Portable 1 View  Result Date: 06/26/2021 CLINICAL DATA:  Fall. EXAM: PORTABLE CHEST 1 VIEW COMPARISON:  Chest x-ray 06/24/2021 FINDINGS: Patient is status post cardiac surgery. Left-sided pacemaker is again seen. The heart is enlarged, unchanged. Lungs and costophrenic angles are clear. There is no  pneumothorax. No acute fractures are seen. IMPRESSION: 1. Stable cardiomegaly. 2. No acute cardiopulmonary process. Electronically Signed   By: Ronney Asters M.D.   On: 06/26/2021 16:29    Procedures Procedures    Medications Ordered in ED Medications  Tdap (BOOSTRIX) injection 0.5 mL (0.5 mLs Intramuscular Given 06/26/21 1648)  lidocaine-EPINEPHrine-tetracaine (LET) topical gel (3 mLs Topical Given 06/26/21 1835)    ED Course/ Medical Decision Making/ A&P                           Medical Decision Making Amount and/or Complexity of Data Reviewed Radiology: ordered.  Risk Prescription drug management.   Thurston L Chavarin is a 81 y.o. male with a past medical history of hypertension, chronic Foley, CAD, sick sinus syndrome with a pacemaker, and recent admission for pneumonia who was reportedly discharged 2 days ago who presents with fall.  Patient is a level 2 trauma as he has injury to his head and is on Plavix.  On arrival, patient is reporting some mild forehead pain but otherwise is denying  any other acute injuries.  He reports he was discharged in the hospital 2 days ago for pneumonia but is doing well.  He reports his cough is improved and only has mild shortness of breath after the fall.  Denies any chest pain or back pain.  Nuys neck pain or neck stiffness.  Denies any other complaints when asked and does not report any fevers, chills, nausea, vomiting, constipation, diarrhea, urinary changes.  No other injuries aside from a forehead laceration reported.  On arrival, vital signs reassuring.  He is not tachycardic or hypotensive.  He is not tachypneic and oxygen saturations are in the upper 90s on room air.  He is afebrile.  Airways intact on arrival and breath sounds are equal bilaterally.  Blood pressure was not low.  On further exam, he does have a 2 similar laceration to his forehead that is hemostatic.  We will wash and evaluate the wound to see what type of repair it may need.   Otherwise he has no evidence of nasal septal hematoma, oropharyngeal exam is unremarkable, and pupils are symmetric and reactive normal extract movements.  No evidence of entrapment.  Symmetric smile.  Clear speech.  No other evidence of traumatic injuries.  With his mild shortness of breath reported, will get a chest x-ray to make sure there is no rib fracture or pneumonia seen initially.  Portable chest x-ray was obtained showing no evidence of pneumothorax on my bedside review.  He had no pelvic or hip tenderness, doubt pelvic injury.  Per patient and family, he tried to get up out of the bed and since had a mechanical fall hitting his head on the ground.  No loss of consciousness and he is denying significant pain aside from the mild to moderate forehead pain.  Will update tetanus as family does not know his last tetanus update.  If CT head and neck are reassuring and chest x-ray does not show acute injuries, given his lack of other complaints and reassuring vital signs, I do suspect patient will be stable for discharge home without more extensive work-up and family agrees at this time.  Anticipate reassessment after imaging.  CT imaging reassuring.  Chest x-ray shows no new abnormality.  Patient is feeling well and has no complaints.  Tetanus was updated and laceration was repaired by Blue PA-C.  Patient be discharged with plans to follow-up with PCP.  Patient agrees and was discharged in good condition.        Final Clinical Impression(s) / ED Diagnoses Final diagnoses:  Injury of head, initial encounter    Rx / DC Orders ED Discharge Orders     None       Clinical Impression: 1. Injury of head, initial encounter     Disposition: Discharge  Condition: Good  I have discussed the results, Dx and Tx plan with the pt(& family if present). He/she/they expressed understanding and agree(s) with the plan. Discharge instructions discussed at great length. Strict return  precautions discussed and pt &/or family have verbalized understanding of the instructions. No further questions at time of discharge.    New Prescriptions   No medications on file    Follow Up: Fredrich Romans, [place] [address] [place] [address] [place] [address] [id number] mc [place]       Kathelene Rumberger, Gwenyth Allegra, MD 06/26/21 [phone]

## 2021-06-26 NOTE — Discharge Instructions (Signed)
Your work-up today revealed a superficial injury to your forehead that was repaired but the CT imaging did not show any acute significant traumatic injuries.  Your chest x-ray also shows no recurrent pneumonia and your vital signs remained reassuring during observation.  We feel you are safe for discharge home after repair and please follow-up with your primary doctor.  If any symptoms change or worsen or he develops signs of wound infection, please return to the nearest emergency department.

## 2021-06-26 NOTE — ED Provider Notes (Signed)
..  Laceration Repair  Date/Time: 06/26/2021 10:01 PM Performed by: Nehemiah Settle, PA-C Authorized by: Nehemiah Settle, PA-C   Consent:    Consent obtained:  Verbal and written   Consent given by:  Patient   Risks, benefits, and alternatives were discussed: yes     Risks discussed:  Pain Universal protocol:    Procedure explained and questions answered to patient or proxy's satisfaction: yes     Site/side marked: yes     Patient identity confirmed:  Verbally with patient and hospital-assigned identification number Anesthesia:    Anesthesia method:  Topical application   Topical anesthetic:  LET Laceration details:    Location:  Scalp   Scalp location:  Frontal   Length (cm):  2 Pre-procedure details:    Preparation:  Patient was prepped and draped in usual sterile fashion and imaging obtained to evaluate for foreign bodies Exploration:    Hemostasis achieved with:  LET   Imaging outcome: foreign body not noted     Wound exploration: entire depth of wound visualized     Contaminated: no   Treatment:    Area cleansed with:  Povidone-iodine and saline   Amount of cleaning:  Standard   Irrigation solution:  Sterile saline   Irrigation method:  Syringe Skin repair:    Repair method:  Sutures   Suture size:  5-0   Suture material:  Chromic gut   Suture technique:  Simple interrupted   Number of sutures:  5 Approximation:    Approximation:  Close Repair type:    Repair type:  Simple Post-procedure details:    Dressing:  Non-adherent dressing   Procedure completion:  Tolerated well, no immediate complications    Todd Dunn A, PA-C 06/26/21 2203    Tegeler, Gwenyth Allegra, MD 06/26/21 [phone]

## 2021-07-06 ENCOUNTER — Other Ambulatory Visit: Payer: Self-pay | Admitting: Physician Assistant

## 2021-07-06 ENCOUNTER — Other Ambulatory Visit: Payer: Self-pay | Admitting: Pharmacy Technician

## 2021-07-06 DIAGNOSIS — D508 Other iron deficiency anemias: Secondary | ICD-10-CM

## 2021-07-07 ENCOUNTER — Inpatient Hospital Stay: Payer: Medicare Other

## 2021-07-07 ENCOUNTER — Telehealth: Payer: Self-pay | Admitting: Physician Assistant

## 2021-07-07 ENCOUNTER — Inpatient Hospital Stay: Payer: Medicare Other | Admitting: Physician Assistant

## 2021-07-07 NOTE — Telephone Encounter (Signed)
R/s per 1/31 rn request, pt has been called and confirmed appt

## 2021-07-10 ENCOUNTER — Inpatient Hospital Stay: Payer: Medicare Other | Attending: Physician Assistant

## 2021-07-10 ENCOUNTER — Inpatient Hospital Stay (HOSPITAL_BASED_OUTPATIENT_CLINIC_OR_DEPARTMENT_OTHER): Payer: Medicare Other | Admitting: Physician Assistant

## 2021-07-10 ENCOUNTER — Other Ambulatory Visit: Payer: Self-pay

## 2021-07-10 VITALS — BP 124/78 | HR 40 | Temp 97.7°F | Resp 18 | Wt 131.3 lb

## 2021-07-10 DIAGNOSIS — Z8673 Personal history of transient ischemic attack (TIA), and cerebral infarction without residual deficits: Secondary | ICD-10-CM | POA: Insufficient documentation

## 2021-07-10 DIAGNOSIS — D649 Anemia, unspecified: Secondary | ICD-10-CM | POA: Diagnosis not present

## 2021-07-10 DIAGNOSIS — Z79899 Other long term (current) drug therapy: Secondary | ICD-10-CM | POA: Insufficient documentation

## 2021-07-10 DIAGNOSIS — M549 Dorsalgia, unspecified: Secondary | ICD-10-CM | POA: Insufficient documentation

## 2021-07-10 DIAGNOSIS — D508 Other iron deficiency anemias: Secondary | ICD-10-CM

## 2021-07-10 DIAGNOSIS — I2510 Atherosclerotic heart disease of native coronary artery without angina pectoris: Secondary | ICD-10-CM | POA: Insufficient documentation

## 2021-07-10 DIAGNOSIS — G8929 Other chronic pain: Secondary | ICD-10-CM | POA: Insufficient documentation

## 2021-07-10 DIAGNOSIS — K219 Gastro-esophageal reflux disease without esophagitis: Secondary | ICD-10-CM | POA: Diagnosis not present

## 2021-07-10 DIAGNOSIS — I10 Essential (primary) hypertension: Secondary | ICD-10-CM | POA: Diagnosis not present

## 2021-07-10 LAB — CMP (CANCER CENTER ONLY)
ALT: 10 U/L (ref 0–44)
AST: 12 U/L — ABNORMAL LOW (ref 15–41)
Albumin: 3.5 g/dL (ref 3.5–5.0)
Alkaline Phosphatase: 161 U/L — ABNORMAL HIGH (ref 38–126)
Anion gap: 6 (ref 5–15)
BUN: 19 mg/dL (ref 8–23)
CO2: 31 mmol/L (ref 22–32)
Calcium: 10.1 mg/dL (ref 8.9–10.3)
Chloride: 106 mmol/L (ref 98–111)
Creatinine: 0.87 mg/dL (ref 0.61–1.24)
GFR, Estimated: >60 mL/min (ref >60)
Glucose, Bld: 108 mg/dL — ABNORMAL HIGH (ref 70–99)
Potassium: 3.5 mmol/L (ref 3.5–5.1)
Sodium: 143 mmol/L (ref 135–145)
Total Bilirubin: 0.4 mg/dL (ref 0.3–1.2)
Total Protein: 6.8 g/dL (ref 6.5–8.1)

## 2021-07-10 LAB — CBC WITH DIFFERENTIAL (CANCER CENTER ONLY)
Abs Immature Granulocytes: 0.01 10*3/uL (ref 0.00–0.07)
Basophils Absolute: 0 10*3/uL (ref 0.0–0.1)
Basophils Relative: 1 %
Eosinophils Absolute: 0 10*3/uL (ref 0.0–0.5)
Eosinophils Relative: 1 %
HCT: 39.5 % (ref 39.0–52.0)
Hemoglobin: 12 g/dL — ABNORMAL LOW (ref 13.0–17.0)
Immature Granulocytes: 0 %
Lymphocytes Relative: 28 %
Lymphs Abs: 1.1 10*3/uL (ref 0.7–4.0)
MCH: 24.9 pg — ABNORMAL LOW (ref 26.0–34.0)
MCHC: 30.4 g/dL (ref 30.0–36.0)
MCV: 82 fL (ref 80.0–100.0)
Monocytes Absolute: 0.3 10*3/uL (ref 0.1–1.0)
Monocytes Relative: 7 %
Neutro Abs: 2.6 10*3/uL (ref 1.7–7.7)
Neutrophils Relative %: 63 %
Platelet Count: 145 10*3/uL — ABNORMAL LOW (ref 150–400)
RBC: 4.82 MIL/uL (ref 4.22–5.81)
RDW: 24 % — ABNORMAL HIGH (ref 11.5–15.5)
WBC Count: 4.1 10*3/uL (ref 4.0–10.5)
nRBC: 0 % (ref 0.0–0.2)

## 2021-07-10 LAB — IRON AND IRON BINDING CAPACITY (CC-WL,HP ONLY)
Iron: 34 ug/dL — ABNORMAL LOW (ref 45–182)
Saturation Ratios: 13 % — ABNORMAL LOW (ref 17.9–39.5)
TIBC: 256 ug/dL (ref 250–450)
UIBC: 222 ug/dL (ref 117–376)

## 2021-07-10 LAB — RETIC PANEL
Immature Retic Fract: 11.7 % (ref 2.3–15.9)
RBC.: 4.83 MIL/uL (ref 4.22–5.81)
Retic Count, Absolute: 44 10*3/uL (ref 19.0–186.0)
Retic Ct Pct: 0.9 % (ref 0.4–3.1)
Reticulocyte Hemoglobin: 30.3 pg (ref >27.9)

## 2021-07-10 LAB — VITAMIN B12: Vitamin B-12: 737 pg/mL (ref 180–914)

## 2021-07-10 NOTE — Progress Notes (Signed)
[place] [phone]   [phone]  PROGRESS NOTE  Patient Care Team: Fredrich Romans, [place] as PCP - General (Physician Assistant) Jettie Booze, MD as PCP - Cardiology (Cardiology) Fredrich Romans, [place] (Physician Assistant)  CHIEF COMPLAINTS/PURPOSE OF CONSULTATION:  Anemia  HISTORY OF PRESENTING ILLNESS:  Todd Dunn is a 81 y.o. 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 He was last seen in clinic on 05/05/2021.  In the interim, patient continues on vitamin B12 supplementation and received IV monoferric 1000 mg x 1 dose on 05/28/2021 .  Patient was recently admitted in earlier this month for community acquired pneumonia and acute respiratory failure with hypoxia in the setting of COPD exacerbation. Patient is accompanied by his grandson. He is the main historian during the visit.   On exam today, Mr. Siegert reports his energy levels are overall stable.  He is in bed or in a wheelchair most of the day due to being a fall risk.  His family members help him ambulate.  He reports his appetite is stable and tries to eat to maintain his weight.  He denies any nausea, vomiting or abdominal pain.  He has chronic back pain that has been present for several years.  Patient reports having a residual cough with yellow sputum.  He denies any fevers, chills or difficulty breathing at this time.  He denies easy bruising or signs of bleeding.  He reports his bowel habits are regular without any diarrhea or constipation.  He continues to take vitamin B12 supplementation without any issues.  He refuses to take iron pills as he feels it causes him diarrhea.He has no other complaints.   MEDICAL HISTORY:  Past Medical History:  Diagnosis Date   BPH (benign prostatic hyperplasia)    CAD (coronary artery disease)    Chronic indwelling Foley catheter    CVA (cerebral vascular accident) ([place])    GERD (gastroesophageal  reflux disease)    HTN (hypertension)    Neurogenic bladder    SSS (sick sinus syndrome) ([place])    s/p MDT ICD   Vitamin D deficiency     SURGICAL HISTORY: Past Surgical History:  Procedure Laterality Date   ABDOMINAL AORTOGRAM W/LOWER EXTREMITY N/A 03/23/2021   Procedure: ABDOMINAL AORTOGRAM W/LOWER EXTREMITY;  Surgeon: Waynetta Sandy, MD;  Location: [facility];  Service: Cardiovascular;  Laterality: N/A;   AMPUTATION Right 04/01/2021   Procedure: RIGHT GREAT TOE AMPUTATION;  Surgeon: Newt Minion, MD;  Location: [facility];  Service: Orthopedics;  Laterality: Right;   APPENDECTOMY     APPLICATION OF WOUND VAC Right 04/01/2021   Procedure: APPLICATION OF WOUND VAC;  Surgeon: Newt Minion, MD;  Location: [place];  Service: Orthopedics;  Laterality: Right;   CARDIAC SURGERY     FEMORAL-TIBIAL BYPASS GRAFT Right 03/26/2021   Procedure: BYPASS GRAFT FEMORAL-TIBIAL ARTERY.  Harvest right leg saphenous vein, right vein patch angioplasty posterior tibial.;  Surgeon: Marty Heck, MD;  Location: [facility];  Service: Vascular;  Laterality: Right;   PACEMAKER PLACEMENT      SOCIAL HISTORY: Social History   Socioeconomic History   Marital status: Widowed    Spouse name: Not on file   Number of children: Not on file   Years of education: Not on file   Highest education level: Not on file  Occupational History   Not on file  Tobacco Use   Smoking status: Former  Types: Cigarettes   Smokeless tobacco: Never  Substance and Sexual Activity   Alcohol use: Not Currently   Drug use: Never   Sexual activity: Not on file  Other Topics Concern   Not on file  Social History Narrative   ** Merged History Encounter **       Social Determinants of Health   Financial Resource Strain: Not on file  Food Insecurity: Not on file  Transportation Needs: Not on file  Physical Activity: Not on file  Stress: Not on file  Social Connections: Not on file  Intimate Partner  Violence: Not on file    FAMILY HISTORY: Family History  Family history unknown: Yes    ALLERGIES:  is allergic to food, aspirin, and [place] clam (m. mercenaria) skin test.  MEDICATIONS:  Current Outpatient Medications  Medication Sig Dispense Refill   acetaminophen (TYLENOL) 325 MG tablet Take 650 mg by mouth every 4 (four) hours as needed for mild pain.     albuterol (VENTOLIN HFA) 108 (90 Base) MCG/ACT inhaler Inhale 2 puffs into the lungs every 6 (six) hours as needed for wheezing or shortness of breath. 6.7 g 0   amLODipine (NORVASC) 10 MG tablet Take 10 mg by mouth daily.     clopidogrel (PLAVIX) 75 MG tablet Take 75 mg by mouth at bedtime.     divalproex (DEPAKOTE) 125 MG DR tablet Take 125 mg by mouth at bedtime.     finasteride (PROSCAR) 5 MG tablet Take 1 tablet (5 mg total) by mouth every morning. 30 tablet 2   nitroGLYCERIN (NITROSTAT) 0.4 MG SL tablet Place 0.4 mg under the tongue every 5 (five) minutes x 3 doses as needed for chest pain.     pantoprazole (PROTONIX) 40 MG tablet Take 1 tablet (40 mg total) by mouth daily. 30 tablet 1   polyethylene glycol (MIRALAX / GLYCOLAX) 17 g packet Take 17 g by mouth daily as needed for mild constipation. 14 each 0   rosuvastatin (CRESTOR) 10 MG tablet Take 10 mg by mouth every morning.     vitamin B-12 (CYANOCOBALAMIN) 1000 MCG tablet Take 1 tablet (1,000 mcg total) by mouth daily. 30 tablet 3   Vitamin D, Ergocalciferol, (DRISDOL) 1.25 MG (50000 UNIT) CAPS capsule Take 50,000 Units by mouth every Monday.     HYDROcodone-acetaminophen (NORCO/VICODIN) 5-325 MG tablet Take 1-2 tablets by mouth every 4 (four) hours as needed for moderate pain (pain score 4-6). (Patient not taking: Reported on 06/21/2021) 10 tablet 0   No current facility-administered medications for this visit.    REVIEW OF SYSTEMS:   Constitutional: ( - ) fevers, ( - )  chills , ( - ) night sweats Eyes: ( - ) blurriness of vision, ( - ) double vision, ( - )  watery eyes Ears, nose, mouth, throat, and face: ( - ) mucositis, ( - ) sore throat Respiratory: ( +) cough, ( - ) dyspnea, ( - ) wheezes Cardiovascular: ( - ) palpitation, ( - ) chest discomfort, ( - ) lower extremity swelling Gastrointestinal:  ( - ) nausea, ( - ) heartburn, ( - ) change in bowel habits Skin: ( - ) abnormal skin rashes Lymphatics: ( - ) new lymphadenopathy, ( - ) easy bruising Neurological: ( - ) numbness, ( - ) tingling, ( - ) new weaknesses Behavioral/Psych: ( - ) mood change, ( - ) new changes  All other systems were reviewed with the patient and are negative.  PHYSICAL EXAMINATION: ECOG PERFORMANCE  STATUS: 2 - Symptomatic, <50% confined to bed  Vitals:   07/10/21 1545  BP: 124/78  Resp: 18  Temp: 97.7 F (36.5 C)  SpO2: 100%    Filed Weights   07/10/21 1545  Weight: 131 lb 4.8 oz (59.6 kg)     GENERAL: thin elderly male in NAD  SKIN: skin color, texture, turgor are normal, no rashes or significant lesions EYES: conjunctiva are pink and non-injected, sclera clear OROPHARYNX: no exudate, no erythema; lips, buccal mucosa, and tongue normal  LUNGS: clear to auscultation and percussion with normal breathing effort HEART: regular rate & rhythm and no murmurs and no lower extremity edema PSYCH: alert & oriented x 3, fluent speech NEURO: no focal motor/sensory deficits  LABORATORY DATA:  I have reviewed the data as listed CBC Latest Ref Rng & Units 07/10/2021 06/24/2021 06/23/2021  WBC 4.0 - 10.5 K/uL 4.1 3.7(L) 5.3  Hemoglobin 13.0 - 17.0 g/dL 12.0(L) 10.4(L) 10.2(L)  Hematocrit 39.0 - 52.0 % 39.5 34.7(L) 34.9(L)  Platelets 150 - 400 K/uL 145(L) 132(L) 192    CMP Latest Ref Rng & Units 07/10/2021 06/24/2021 06/23/2021  Glucose 70 - 99 mg/dL 108(H) 158(H) 165(H)  BUN 8 - 23 mg/dL 19 18 16   Creatinine 0.61 - 1.24 mg/dL 0.87 0.76 0.81  Sodium 135 - 145 mmol/L 143 138 138  Potassium 3.5 - 5.1 mmol/L 3.5 3.9 3.5  Chloride 98 - 111 mmol/L 106 105 106  CO2 22 -  32 mmol/L 31 26 28   Calcium 8.9 - 10.3 mg/dL 10.1 9.0 9.1  Total Protein 6.5 - 8.1 g/dL 6.8 6.3(L) 6.3(L)  Total Bilirubin 0.3 - 1.2 mg/dL 0.4 0.2(L) 0.3  Alkaline Phos 38 - 126 U/L 161(H) 116 114  AST 15 - 41 U/L 12(L) 33 36  ALT 0 - 44 U/L 10 47(H) 46(H)    ASSESSMENT & PLAN Jakell L Klingerman is a 81 y.o. male returns for a follow up for normocytic anemia.   #Chronic Anemia: --Likely multifactorial from vitamin B12 deficiency, iron deficiency and chronic disease.  --Suspect iron deficiency is secondary to recent toe amputation but will place referral to gastroenterology to evaluate for malabsorption versus GI bleed.  --Currently takes vitamin B12 supplementation 1000 mcg once daily. --Patient refuses to take oral iron so arranged IV monoferric that was administered on 05/28/2021.  --Labs today show improvement of anemia with Hgb of 12.0, mild thrombocytopenia with plt of 145K. Vitamin B12 levels are normal. Iron panel shows improvement of iron deficiency with ferritin 100,serum iron 34 and iron saturation 13%.  --Recommend another round of IV monoferric 1000 mg x 1 dose to bolster hemoglobin levels.  --RTC in 3 months to see Dr. Lorenso Courier for labs.   Orders Placed This Encounter  Procedures   Ambulatory referral to Gastroenterology    Referral Priority:   Routine    Referral Type:   Consultation    Referral Reason:   Specialty Services Required    Number of Visits Requested:   1     All questions were answered. The patient knows to call the clinic with any problems, questions or concerns.  I have spent a total of 30 minutes minutes of face-to-face and non-face-to-face time, preparing to see the patient, obtaining and/or reviewing separately obtained history, performing a medically appropriate examination, counseling and educating the patient, ordering medications/tests/procedures, referring and communicating with other health care professionals, documenting clinical information in  the electronic health record, independently interpreting results and communicating results to the patient, and  care coordination.    Dede Query, PA-C Department of Hematology/Oncology [place] [facility] Phone: [phone]

## 2021-07-13 ENCOUNTER — Encounter: Payer: Self-pay | Admitting: Physician Assistant

## 2021-07-13 ENCOUNTER — Telehealth: Payer: Self-pay

## 2021-07-13 LAB — METHYLMALONIC ACID, SERUM: Methylmalonic Acid, Quantitative: 99 nmol/L (ref 0–378)

## 2021-07-13 LAB — FERRITIN: Ferritin: 100 ng/mL (ref 24–336)

## 2021-07-13 NOTE — Telephone Encounter (Signed)
can you make referral to [facility]? Iron deficiency anemia  Referral faxed and confirmation received

## 2021-07-16 ENCOUNTER — Telehealth: Payer: Self-pay | Admitting: Pharmacy Technician

## 2021-07-16 NOTE — Telephone Encounter (Signed)
Auth Submission: PENDING Payer: [employer] MEDICARE / MEDICAID Medication & CPT/J Code(s) submitted: MONOFERRIC J1437 Route of submission (phone, fax, portal): PORTAL Auth type: Buy/Bill Units/visits requested: 1  Will update once we receive a response.

## 2021-07-17 ENCOUNTER — Ambulatory Visit: Payer: Medicare Other | Attending: Nurse Practitioner | Admitting: Nurse Practitioner

## 2021-07-17 ENCOUNTER — Encounter: Payer: Self-pay | Admitting: Nurse Practitioner

## 2021-07-17 ENCOUNTER — Other Ambulatory Visit: Payer: Self-pay

## 2021-07-17 VITALS — BP 132/84 | HR 66 | Ht 74.0 in | Wt 128.0 lb

## 2021-07-17 DIAGNOSIS — K5909 Other constipation: Secondary | ICD-10-CM

## 2021-07-17 DIAGNOSIS — I10 Essential (primary) hypertension: Secondary | ICD-10-CM

## 2021-07-17 DIAGNOSIS — Z7689 Persons encountering health services in other specified circumstances: Secondary | ICD-10-CM | POA: Diagnosis not present

## 2021-07-17 DIAGNOSIS — M869 Osteomyelitis, unspecified: Secondary | ICD-10-CM

## 2021-07-17 DIAGNOSIS — I6930 Unspecified sequelae of cerebral infarction: Secondary | ICD-10-CM

## 2021-07-17 DIAGNOSIS — E43 Unspecified severe protein-calorie malnutrition: Secondary | ICD-10-CM | POA: Diagnosis not present

## 2021-07-17 DIAGNOSIS — Z89411 Acquired absence of right great toe: Secondary | ICD-10-CM

## 2021-07-17 DIAGNOSIS — R1311 Dysphagia, oral phase: Secondary | ICD-10-CM

## 2021-07-17 DIAGNOSIS — I739 Peripheral vascular disease, unspecified: Secondary | ICD-10-CM

## 2021-07-17 DIAGNOSIS — D519 Vitamin B12 deficiency anemia, unspecified: Secondary | ICD-10-CM

## 2021-07-17 MED ORDER — PANTOPRAZOLE SODIUM 40 MG PO TBEC: 40.0000 mg | DELAYED_RELEASE_TABLET | Freq: Every day | ORAL | 1 refills | Status: DC

## 2021-07-17 MED ORDER — VITAMIN B-12 1000 MCG PO TABS: 1000.0000 ug | ORAL_TABLET | Freq: Every day | ORAL | 3 refills | Status: AC

## 2021-07-17 MED ORDER — ROSUVASTATIN CALCIUM 10 MG PO TABS: 10.0000 mg | ORAL_TABLET | Freq: Every morning | ORAL | 3 refills | Status: DC

## 2021-07-17 MED ORDER — AMLODIPINE BESYLATE 10 MG PO TABS: 10.0000 mg | ORAL_TABLET | Freq: Every day | ORAL | 1 refills | Status: DC

## 2021-07-17 MED ORDER — CLOPIDOGREL BISULFATE 75 MG PO TABS: 75.0000 mg | ORAL_TABLET | Freq: Every day | ORAL | 3 refills | Status: DC

## 2021-07-17 MED ORDER — DIVALPROEX SODIUM 125 MG PO DR TAB: 125.0000 mg | DELAYED_RELEASE_TABLET | Freq: Every day | ORAL | 1 refills | Status: DC

## 2021-07-17 MED ORDER — POLYETHYLENE GLYCOL 3350 17 G PO PACK: 17.0000 g | PACK | Freq: Every day | ORAL | 0 refills | Status: DC | PRN

## 2021-07-17 NOTE — Progress Notes (Signed)
Assessment & Plan:  Todd Dunn was seen today for establish care.  Diagnoses and all orders for this visit:  Encounter to establish care  Right great toe amputee Todd Dunn) Continue follow up with Dr. Sharol Given No evidence of infection at this time  Protein-calorie malnutrition, severe (Todd Dunn) Continue current nutrition plan  Osteomyelitis of great toe of right foot (Todd Dunn) RESOLVED  PVD (peripheral vascular disease) (Todd Dunn) Follow up vein and vascular as instructed  Primary hypertension -     amLODipine (NORVASC) 10 MG tablet; Take 1 tablet (10 mg total) by mouth daily.  History of stroke with current residual effects -     clopidogrel (PLAVIX) 75 MG tablet; Take 1 tablet (75 mg total) by mouth at bedtime. -     rosuvastatin (CRESTOR) 10 MG tablet; Take 1 tablet (10 mg total) by mouth every morning.  Oral phase dysphagia -     pantoprazole (PROTONIX) 40 MG tablet; Take 1 tablet (40 mg total) by mouth daily.  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  Chronic constipation -     polyethylene glycol (MIRALAX / GLYCOLAX) 17 g packet; Take 17 g by mouth daily as needed for mild constipation.  Other orders -     divalproex (DEPAKOTE) 125 MG DR tablet; Take 1 tablet (125 mg total) by mouth at bedtime.    Patient has been counseled on age-appropriate routine health concerns for screening and prevention. These are reviewed and up-to-date. Referrals have been placed accordingly. Immunizations are up-to-date Dunn declined.    Subjective:   Chief Complaint  Patient presents with   Establish Care   HPI Todd Dunn 81 y.o. male presents to office today to establish care.  He has a past medical history of Anemia with B12 deficiency,  HTN, BPH, chronic Foley with neurogenic bladder, CAD, sick sinus syndrome with a pacemaker, PNA, CVA with residual left hemiplegia,  dysphagia and dysarthria, GERD, PVD, osteomyelitis of right great toe with amputation, Right common femoral artery to posterior tibial artery bypass and  vein patch angioplasty of the right posterior tibial artery  in the mid calf with great saphenous vein (03/26/2021)   He is followed by Urology for chronic GU issues  He is followed by Oncology for blood disorders   Accompanied today by his granddaughter's boyfriend. Using a geri chair for mobility.   Blood pressure is well controlled today with amlodipine 10 mg daily. LDL at goal with rosuvastatin 10 mg daily.  BP Readings from Last 3 Encounters:  07/17/21 132/84  07/10/21 124/78  06/26/21 (!) 150/73   Dunn Results  Component Value Date   LDLCALC 49 03/27/2021        Review of Systems  Constitutional:  Negative for fever, malaise/fatigue and weight loss.  HENT: Negative.  Negative for nosebleeds.   Eyes: Negative.  Negative for blurred vision, double vision and photophobia.  Respiratory: Negative.  Negative for cough and shortness of breath.   Cardiovascular: Negative.  Negative for chest pain, palpitations and leg swelling.  Gastrointestinal:  Positive for constipation. Negative for abdominal pain, blood in stool, diarrhea, heartburn, melena, nausea and vomiting.  Genitourinary:        NEUROGENIC BLADDER  Musculoskeletal: Negative.  Negative for myalgias.  Neurological:  Positive for speech change and weakness. Negative for dizziness, focal weakness and headaches.   Past Medical History:  Diagnosis Date  BPH (benign prostatic hyperplasia)    CAD (coronary artery disease)    Chronic indwelling Foley catheter    CVA (cerebral vascular accident) (Todd Dunn)    GERD (gastroesophageal reflux disease)    HTN (hypertension)    Neurogenic bladder    SSS (sick sinus syndrome) (Todd Dunn)    s/p MDT ICD   Vitamin D deficiency     Past Surgical History:  Procedure Laterality Date   ABDOMINAL AORTOGRAM W/LOWER EXTREMITY N/A 03/23/2021    Procedure: ABDOMINAL AORTOGRAM W/LOWER EXTREMITY;  Surgeon: Todd Sandy, MD;  Location: [facility];  Service: Cardiovascular;  Laterality: N/A;   AMPUTATION Right 04/01/2021   Procedure: RIGHT GREAT TOE AMPUTATION;  Surgeon: Todd Minion, MD;  Location: [facility];  Service: Orthopedics;  Laterality: Right;   APPENDECTOMY     APPLICATION OF WOUND VAC Right 04/01/2021   Procedure: APPLICATION OF WOUND VAC;  Surgeon: Todd Minion, MD;  Location: [facility];  Service: Orthopedics;  Laterality: Right;   CARDIAC SURGERY     FEMORAL-TIBIAL BYPASS GRAFT Right 03/26/2021   Procedure: BYPASS GRAFT FEMORAL-TIBIAL ARTERY.  Harvest right leg saphenous vein, right vein patch angioplasty posterior tibial.;  Surgeon: Todd Heck, MD;  Location: [facility];  Service: Vascular;  Laterality: Right;   PACEMAKER PLACEMENT      Family History  Family history unknown: Yes    Social History Reviewed with no changes to be made today.   Outpatient Medications Prior to Visit  Medication Sig Dispense Refill   acetaminophen (TYLENOL) 325 MG tablet Take 650 mg by mouth every 4 (four) hours as needed for mild pain.     albuterol (VENTOLIN HFA) 108 (90 Base) MCG/ACT inhaler Inhale 2 puffs into the lungs every 6 (six) hours as needed for wheezing Dunn shortness of breath. 6.7 g 0   finasteride (PROSCAR) 5 MG tablet Take 1 tablet (5 mg total) by mouth every morning. 30 tablet 2   nitroGLYCERIN (NITROSTAT) 0.4 MG SL tablet Place 0.4 mg under the tongue every 5 (five) minutes x 3 doses as needed for chest pain.     Vitamin D, Ergocalciferol, (DRISDOL) 1.25 MG (50000 UNIT) CAPS capsule Take 50,000 Units by mouth every Monday.     amLODipine (NORVASC) 10 MG tablet Take 10 mg by mouth daily.     clopidogrel (PLAVIX) 75 MG tablet Take 75 mg by mouth at bedtime.     divalproex (DEPAKOTE) 125 MG DR tablet Take 125 mg by mouth at bedtime.     pantoprazole (PROTONIX) 40 MG tablet Take 1 tablet (40 mg total) by  mouth daily. 30 tablet 1   polyethylene glycol (MIRALAX / GLYCOLAX) 17 g packet Take 17 g by mouth daily as needed for mild constipation. 14 each 0   rosuvastatin (CRESTOR) 10 MG tablet Take 10 mg by mouth every morning.     vitamin B-12 (CYANOCOBALAMIN) 1000 MCG tablet Take 1 tablet (1,000 mcg total) by mouth daily. 30 tablet 3   No facility-administered medications prior to visit.    Allergies  Allergen Reactions   Food Anaphylaxis and Rash    Clams   Aspirin Itching   [place] Clam (M. Mercenaria) Skin Test Itching       Objective:    BP 132/84    Pulse 66    Ht 6\' 2"  (1.88 m)    Wt 128 lb (58.1 kg)    SpO2 95% Comment: RA   BMI 16.43 kg/m  Wt Readings from Last  3 Encounters:  07/17/21 128 lb (58.1 kg)  07/10/21 131 lb 4.8 oz (59.6 kg)  06/26/21 125 lb 10.6 oz (57 kg)     Physical Exam Vitals and nursing note reviewed.  Constitutional:      Appearance: He is well-developed.  HENT:     Head: Normocephalic and atraumatic.  Cardiovascular:     Rate and Rhythm: Normal rate and regular rhythm.     Heart sounds: Normal heart sounds. No murmur heard.   No friction rub. No gallop.  Pulmonary:     Effort: Pulmonary effort is normal. No tachypnea Dunn respiratory distress.     Breath sounds: Normal breath sounds. No decreased breath sounds, wheezing, rhonchi Dunn rales.  Chest:     Chest wall: No tenderness.  Abdominal:     General: Bowel sounds are normal.     Palpations: Abdomen is soft.  Musculoskeletal:        General: Normal range of motion.     Cervical back: Normal range of motion.  Skin:    General: Skin is warm and dry.  Neurological:     Gait: Gait abnormal.     Comments: Dysarthria  Psychiatric:        Behavior: Behavior is cooperative.         Patient has been counseled extensively about nutrition and exercise as well as the importance of adherence with medications and regular follow-up. The patient was given clear instructions to go to ER Dunn return  to medical center if symptoms don't improve, worsen Dunn new problems develop. The patient verbalized understanding.   Follow-up: Return in about 4 weeks (around 08/14/2021) for labs 4 weeks LFT. see me in 3 months.   [name], FNP-BC [facility], [place] [facility]   07/17/2021, 2:08 PM

## 2021-07-19 ENCOUNTER — Other Ambulatory Visit: Payer: Self-pay | Admitting: Nurse Practitioner

## 2021-07-19 DIAGNOSIS — R1311 Dysphagia, oral phase: Secondary | ICD-10-CM

## 2021-07-20 NOTE — Telephone Encounter (Signed)
Requested medications are due for refill today.  No  Requested medications are on the active medications list.  yes  Last refill. 07/17/2021 #301 refill  Future visit scheduled.   yes  Notes to clinic.  Pt is requesting 90 day supply.    Requested Prescriptions  Pending Prescriptions Disp Refills   pantoprazole (PROTONIX) 40 MG tablet [Pharmacy Med Name: PANTOPRAZOLE 40MG  TABLETS] 90 tablet     Sig: TAKE 1 TABLET(40 MG) BY MOUTH DAILY     Gastroenterology: Proton Pump Inhibitors Passed - 07/19/2021  9:55 AM      Passed - Valid encounter within last 12 months    Recent Outpatient Visits           3 days ago Encounter to establish care   [place] [name], NP       Future Appointments             In 2 months [name], NP [place]

## 2021-07-22 ENCOUNTER — Telehealth: Payer: Self-pay | Admitting: Hematology and Oncology

## 2021-07-22 NOTE — Telephone Encounter (Signed)
Dr. Charlies Silvers, Jul[place] Rainier note: Auth Submission: Approved Payer: [facility] MEDICARE / MEDICAID Medication & CPT/J Code(s) submitted: MONOFERRIC Route of submission (phone, fax, portal): PORTAL Auth type: Buy/Bill Units/visits requested: 1000 MG X1 Reference number: [id number] Approval from: 07/14/21 to 07/14/22   Patient will be scheduled as soon as possible

## 2021-07-22 NOTE — Telephone Encounter (Signed)
Scheduled per 2/15 secure chat, pt granddaughter has been called and confirmed appt

## 2021-07-23 ENCOUNTER — Emergency Department (HOSPITAL_COMMUNITY)
Admission: EM | Admit: 2021-07-23 | Discharge: 2021-07-24 | Disposition: A | Discharge status: Home / Self Care | Payer: Medicare Other | Attending: Emergency Medicine | Admitting: Emergency Medicine

## 2021-07-23 DIAGNOSIS — I110 Hypertensive heart disease with heart failure: Secondary | ICD-10-CM | POA: Diagnosis not present

## 2021-07-23 DIAGNOSIS — R8289 Other abnormal findings on cytological and histological examination of urine: Secondary | ICD-10-CM | POA: Insufficient documentation

## 2021-07-23 DIAGNOSIS — Z7902 Long term (current) use of antithrombotics/antiplatelets: Secondary | ICD-10-CM | POA: Insufficient documentation

## 2021-07-23 DIAGNOSIS — N390 Urinary tract infection, site not specified: Secondary | ICD-10-CM | POA: Insufficient documentation

## 2021-07-23 DIAGNOSIS — I5020 Unspecified systolic (congestive) heart failure: Secondary | ICD-10-CM | POA: Insufficient documentation

## 2021-07-23 DIAGNOSIS — D649 Anemia, unspecified: Secondary | ICD-10-CM | POA: Insufficient documentation

## 2021-07-23 DIAGNOSIS — D72829 Elevated white blood cell count, unspecified: Secondary | ICD-10-CM | POA: Diagnosis not present

## 2021-07-23 DIAGNOSIS — Z79899 Other long term (current) drug therapy: Secondary | ICD-10-CM | POA: Insufficient documentation

## 2021-07-23 DIAGNOSIS — R319 Hematuria, unspecified: Secondary | ICD-10-CM | POA: Diagnosis present

## 2021-07-23 LAB — URINALYSIS, ROUTINE W REFLEX MICROSCOPIC
Bilirubin Urine: NEGATIVE
Glucose, UA: NEGATIVE mg/dL
Ketones, ur: NEGATIVE mg/dL
Nitrite: POSITIVE — AB
Protein, ur: 100 mg/dL — AB
RBC / HPF: >50 RBC/hpf — ABNORMAL HIGH (ref 0–5)
Specific Gravity, Urine: 1.019 (ref 1.005–1.030)
WBC, UA: >50 WBC/hpf — ABNORMAL HIGH (ref 0–5)
pH: 6 (ref 5.0–8.0)

## 2021-07-23 LAB — CBC WITH DIFFERENTIAL/PLATELET
Abs Immature Granulocytes: 0.02 10*3/uL (ref 0.00–0.07)
Basophils Absolute: 0 10*3/uL (ref 0.0–0.1)
Basophils Relative: 1 %
Eosinophils Absolute: 0 10*3/uL (ref 0.0–0.5)
Eosinophils Relative: 0 %
HCT: 36.4 % — ABNORMAL LOW (ref 39.0–52.0)
Hemoglobin: 10.9 g/dL — ABNORMAL LOW (ref 13.0–17.0)
Immature Granulocytes: 0 %
Lymphocytes Relative: 25 %
Lymphs Abs: 1.4 10*3/uL (ref 0.7–4.0)
MCH: 24.7 pg — ABNORMAL LOW (ref 26.0–34.0)
MCHC: 29.9 g/dL — ABNORMAL LOW (ref 30.0–36.0)
MCV: 82.4 fL (ref 80.0–100.0)
Monocytes Absolute: 0.5 10*3/uL (ref 0.1–1.0)
Monocytes Relative: 9 %
Neutro Abs: 3.6 10*3/uL (ref 1.7–7.7)
Neutrophils Relative %: 65 %
Platelets: 203 10*3/uL (ref 150–400)
RBC: 4.42 MIL/uL (ref 4.22–5.81)
RDW: 23 % — ABNORMAL HIGH (ref 11.5–15.5)
WBC: 5.5 10*3/uL (ref 4.0–10.5)
nRBC: 0 % (ref 0.0–0.2)

## 2021-07-23 LAB — COMPREHENSIVE METABOLIC PANEL
ALT: 18 U/L (ref 0–44)
AST: 16 U/L (ref 15–41)
Albumin: 2.9 g/dL — ABNORMAL LOW (ref 3.5–5.0)
Alkaline Phosphatase: 146 U/L — ABNORMAL HIGH (ref 38–126)
Anion gap: 8 (ref 5–15)
BUN: 16 mg/dL (ref 8–23)
CO2: 28 mmol/L (ref 22–32)
Calcium: 9.7 mg/dL (ref 8.9–10.3)
Chloride: 106 mmol/L (ref 98–111)
Creatinine, Ser: 0.82 mg/dL (ref 0.61–1.24)
GFR, Estimated: >60 mL/min (ref >60)
Glucose, Bld: 106 mg/dL — ABNORMAL HIGH (ref 70–99)
Potassium: 3.7 mmol/L (ref 3.5–5.1)
Sodium: 142 mmol/L (ref 135–145)
Total Bilirubin: 0.3 mg/dL (ref 0.3–1.2)
Total Protein: 6.6 g/dL (ref 6.5–8.1)

## 2021-07-23 NOTE — ED Triage Notes (Signed)
Pt c/o painful urination, foley in place r/t neurogenic bladder. Denies associated abd/back pain, denies fevers, other associated symptoms. Pt followed by urology

## 2021-07-23 NOTE — ED Provider Triage Note (Signed)
Emergency Medicine Provider Triage Evaluation Note  Todd Dunn , a 81 y.o. male  was evaluated in triage.  Pt complains of painful urination. 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 He is able to tell me that he has been having pain when urinating for the past day. He has a chronic indwelling foley for neurogenic bladder. Denies any fever, chills, abdominal pain, or flank pain  Review of Systems  Positive: Painful urination Negative: Fevers, chills, abdominal pain  Physical Exam  BP 110/75 (BP Location: Right Arm)    Pulse 75    Temp 98.3 F (36.8 C) (Oral)    Resp (!) 22    SpO2 98%  Gen:   Awake, no distress  Resp:  Normal effort  MSK:   Moves extremities without difficulty Other:    Medical Decision Making  Medically screening exam initiated at 7:35 PM.  Appropriate orders placed.  Todd Dunn was informed that the remainder of the evaluation will be completed by another provider, this initial triage assessment does not replace that evaluation, and the importance of remaining in the ED until their evaluation is complete.     Bud Face, PA-C 07/23/21 1938

## 2021-07-24 ENCOUNTER — Encounter (HOSPITAL_COMMUNITY): Payer: Self-pay | Admitting: Radiology

## 2021-07-24 ENCOUNTER — Other Ambulatory Visit (HOSPITAL_COMMUNITY): Payer: Self-pay | Admitting: Urology

## 2021-07-24 DIAGNOSIS — N390 Urinary tract infection, site not specified: Secondary | ICD-10-CM | POA: Diagnosis not present

## 2021-07-24 DIAGNOSIS — R339 Retention of urine, unspecified: Secondary | ICD-10-CM

## 2021-07-24 MED ORDER — CEPHALEXIN 250 MG PO CAPS
500.0000 mg | ORAL_CAPSULE | Freq: Once | ORAL | Status: AC
  Administered 2021-07-24: 500 mg via ORAL
  Filled 2021-07-24: qty 2

## 2021-07-24 MED ORDER — CEPHALEXIN 500 MG PO CAPS: 500.0000 mg | ORAL_CAPSULE | Freq: Three times a day (TID) | ORAL | 0 refills | Status: DC

## 2021-07-24 NOTE — Discharge Instructions (Signed)
Drink plenty of fluids.  Return if you start running a fever, start vomiting, or develop severe abdominal pain.

## 2021-07-24 NOTE — ED Notes (Signed)
Called pt granddaughter and reviewed DC instructions. No further questions. Pt pleasantly confused at baseline. And unable to teach back DC instructions. NAD NARD VSS.

## 2021-07-24 NOTE — Progress Notes (Signed)
Patient Name  Todd Dunn, Todd Dunn Legal Sex  Male DOB  1941/05/12 SSN  [id number] Address  [place] [address] Phone  [phone] [facility])  [phone] (Mobile) *Preferred*    RE: CT IMAGE GUIDED DRAINAGE BY PERCUTANEOUS CATHETER Received: Today Arne Cleveland, MD  Garth Bigness D Yes   CT suprapubic catheter (drain) placement   DDH        Previous Messages   ----- Message -----  From: Garth Bigness D  Sent: 07/24/2021   3:07 PM EST  To: Ir Procedure Requests  Subject: CT IMAGE GUIDED DRAINAGE BY PERCUTANEOUS CAT*    Procedure:  CT IMAGE GUIDED DRAINAGE BY PERCUTANEOUS CATHETER   Reason: Urinary Retention, Suprapubic tube and removal of foley cath   History:  CT's in computer   Physician:    Ardis Hughs   Number:    [phone]

## 2021-07-24 NOTE — Progress Notes (Addendum)
HISTORY AND PHYSICAL     CC:  follow up. Requesting Provider:  Fredrich Romans, PA  HPI: This is a 81 y.o. male who is here today for follow up for PAD.  He has hx of right common femoral artery to posterior tibial artery bypass using 6 mm ringed PTFE and  vein patch angioplasty of the right posterior tibial artery  in the mid calf with great saphenous vein for a right great toe wound on 03/26/2021 by Dr. Carlis Abbott.  He also underwent right great toe amputation by Dr. Sharol Given on 04/01/2021.    He was last seen on 04/28/2021 and his ABI was improved and his right great toe amputation site seemed to be healing.    He has history of cognitive impairment and is a resident at [facility].  History of CVA, neurogenic bladder.  He is on Plavix due to hx of stroke.  The pt returns today for follow up and accompanied by his granddaughter's boyfriend and the granddaughter on the telephone.  Difficult to obtain any information from the pt due to hx of stroke.  Granddaughter states that he has been complaining about pain in the foot for at least a month.  He slammed the door and hit his 2nd toe with the door and now it is darkening and  painful.  His great toe amputation site has healed nicely.  He has not really been walking but recently started working with therapy.    The pt is on a statin for cholesterol management.    The pt is not on an aspirin.    Other AC:  Plavix The pt is on CCB for hypertension.  The pt does not have diabetes. Tobacco hx:  former   Past Medical History:  Diagnosis Date   BPH (benign prostatic hyperplasia)    CAD (coronary artery disease)    Chronic indwelling Foley catheter    CVA (cerebral vascular accident) ([place])    GERD (gastroesophageal reflux disease)    HTN (hypertension)    Neurogenic bladder    SSS (sick sinus syndrome) ([place])    s/p MDT ICD   Vitamin D deficiency     Past Surgical History:  Procedure Laterality Date   ABDOMINAL AORTOGRAM W/LOWER  EXTREMITY N/A 03/23/2021   Procedure: ABDOMINAL AORTOGRAM W/LOWER EXTREMITY;  Surgeon: Waynetta Sandy, MD;  Location: [facility];  Service: Cardiovascular;  Laterality: N/A;   AMPUTATION Right 04/01/2021   Procedure: RIGHT GREAT TOE AMPUTATION;  Surgeon: Newt Minion, MD;  Location: [facility];  Service: Orthopedics;  Laterality: Right;   APPENDECTOMY     APPLICATION OF WOUND VAC Right 04/01/2021   Procedure: APPLICATION OF WOUND VAC;  Surgeon: Newt Minion, MD;  Location: [facility];  Service: Orthopedics;  Laterality: Right;   CARDIAC SURGERY     FEMORAL-TIBIAL BYPASS GRAFT Right 03/26/2021   Procedure: BYPASS GRAFT FEMORAL-TIBIAL ARTERY.  Harvest right leg saphenous vein, right vein patch angioplasty posterior tibial.;  Surgeon: Marty Heck, MD;  Location: [facility];  Service: Vascular;  Laterality: Right;   PACEMAKER PLACEMENT      Allergies  Allergen Reactions   Food Anaphylaxis and Rash    Clams   Aspirin Itching   [place] Clam (M. Mercenaria) Skin Test Itching    Current Outpatient Medications  Medication Sig Dispense Refill   acetaminophen (TYLENOL) 325 MG tablet Take 650 mg by mouth every 4 (four) hours as needed for mild pain.  albuterol (VENTOLIN HFA) 108 (90 Base) MCG/ACT inhaler Inhale 2 puffs into the lungs every 6 (six) hours as needed for wheezing or shortness of breath. 6.7 g 0   amLODipine (NORVASC) 10 MG tablet Take 1 tablet (10 mg total) by mouth daily. 90 tablet 1   cephALEXin (KEFLEX) 500 MG capsule Take 1 capsule (500 mg total) by mouth 3 (three) times daily. 30 capsule 0   clopidogrel (PLAVIX) 75 MG tablet Take 1 tablet (75 mg total) by mouth at bedtime. 90 tablet 3   divalproex (DEPAKOTE) 125 MG DR tablet Take 1 tablet (125 mg total) by mouth at bedtime. 90 tablet 1   finasteride (PROSCAR) 5 MG tablet Take 1 tablet (5 mg total) by mouth every morning. 30 tablet 2   nitroGLYCERIN (NITROSTAT) 0.4 MG SL tablet Place 0.4 mg under the  tongue every 5 (five) minutes x 3 doses as needed for chest pain.     pantoprazole (PROTONIX) 40 MG tablet TAKE 1 TABLET(40 MG) BY MOUTH DAILY 90 tablet 0   polyethylene glycol (MIRALAX / GLYCOLAX) 17 g packet Take 17 g by mouth daily as needed for mild constipation. 14 each 0   rosuvastatin (CRESTOR) 10 MG tablet Take 1 tablet (10 mg total) by mouth every morning. 90 tablet 3   vitamin B-12 (CYANOCOBALAMIN) 1000 MCG tablet Take 1 tablet (1,000 mcg total) by mouth daily. 30 tablet 3   Vitamin D, Ergocalciferol, (DRISDOL) 1.25 MG (50000 UNIT) CAPS capsule Take 50,000 Units by mouth every Monday.     No current facility-administered medications for this visit.    Family History  Family history unknown: Yes    Social History   Socioeconomic History   Marital status: Widowed    Spouse name: Not on file   Number of children: Not on file   Years of education: Not on file   Highest education level: Not on file  Occupational History   Not on file  Tobacco Use   Smoking status: Former    Types: Cigarettes   Smokeless tobacco: Never  Substance and Sexual Activity   Alcohol use: Not Currently   Drug use: Never   Sexual activity: Not on file  Other Topics Concern   Not on file  Social History Narrative   ** Merged History Encounter **       Social Determinants of Health   Financial Resource Strain: Not on file  Food Insecurity: Not on file  Transportation Needs: Not on file  Physical Activity: Not on file  Stress: Not on file  Social Connections: Not on file  Intimate Partner Violence: Not on file     REVIEW OF SYSTEMS:  See HPI [X]  denotes positive finding, [ ]  denotes negative finding Cardiac  Comments:  Chest pain or chest pressure:    Shortness of breath upon exertion:    Short of breath when lying flat:    Irregular heart rhythm:        Vascular    Pain in calf, thigh, or hip brought on by ambulation:    Pain in feet at night that wakes you up from your sleep:      Blood clot in your veins:    Leg swelling:         Pulmonary    Oxygen at home:    Productive cough:     Wheezing:         Neurologic    Sudden weakness in arms or legs:     Sudden  numbness in arms or legs:     Sudden onset of difficulty speaking or slurred speech:    Temporary loss of vision in one eye:     Problems with dizziness:         Gastrointestinal    Blood in stool:     Vomited blood:         Genitourinary    Burning when urinating:     Blood in urine:        Psychiatric    Major depression:         Hematologic    Bleeding problems:    Problems with blood clotting too easily:        Skin    Rashes or ulcers:        Constitutional    Fever or chills:      PHYSICAL EXAMINATION:  Today's Vitals   07/28/21 1120  Resp: 20  Temp: 98.2 F (36.8 C)  TempSrc: Temporal  Height: 6' (1.829 m)   Body mass index is 17.06 kg/m.   General:  WDWN in NAD; vital signs documented above Gait: Not observed-in wheelchair HENT: WNL, normocephalic Pulmonary: normal non-labored breathing , without wheezing Cardiac: regular HR Abdomen: soft, NT Skin: without rashes Vascular Exam/Pulses: + palpable right femoral pulse; pedal pulses are not palpable Extremities:   Musculoskeletal: no muscle wasting or atrophy  Neurologic: essentially aphasic Psychiatric:     Non-Invasive Vascular Imaging:   ABI's/TBI's on 07/28/2021: Right:  0.22/amputation - Great toe pressure: amp Left:  0.40/0.31 - Great toe pressure:  42  RLE Arterial duplex on 07/28/2021: Occluded RLE bypass  Previous ABI's/TBI's on 04/28/2021: Right:  1.09/n/a - Great toe pressure: amputated Left:  0.74/0.59 - Great toe pressure: 91  Previous arterial duplex on 04/28/2021: Summary:  Right: Patent right femoral to posterior tibial artery bypass graft with increased velocity distal to graft in the 50 - 74% stenosis.    ASSESSMENT/PLAN:: 81 y.o. male here for follow up for right common femoral  artery to posterior tibial artery bypass using 6 mm ringed PTFE and  vein patch angioplasty of the right posterior tibial artery  in the mid calf with great saphenous vein for a right great toe wound on 03/26/2021 by Dr. Carlis Abbott.    -pt comes in today with c/o right foot pain.  He is unable to communicate, however granddaughter states he has been complaining about pain in his right foot for at least a month.  Dr. Carlis Abbott discussed with them that given the bypass has been down for at least a month, there are no options for thrombectomy and most likely will require above knee amputation.  His right 2nd toe is discolored but no evidence of infection currently.  Dr. Carlis Abbott discussed that we will send some pain medi ine to his pharmacy and have them return in a couple of weeks for re-evaluation.  If they determine they would like to proceed prior to that visit with amputation, they can call before and schedule.  They are in agreement with this plan.  -continue statin/plavix  -prescription for Norco 5/235 one po q6h prn #20 no refill sent to [facility] on [address].   Leontine Locket, [facility] [phone]  Clinic MD:   pt seen with Dr. Carlis Abbott

## 2021-07-24 NOTE — ED Provider Notes (Signed)
[place] EMERGENCY DEPARTMENT Provider Note   CSN: [id number] Arrival date & time: 07/23/21  1853     History  Chief Complaint  Patient presents with   Dysuria   Hematuria         Todd Dunn is a 81 y.o. male.  The history is provided by the patient.  Dysuria Presenting symptoms: dysuria   Associated symptoms: hematuria   Hematuria He has history of hypertension, hyperlipidemia, stroke, systolic heart failure, chronic indwelling Foley catheter with urinary tract infections and comes in complaining of pain with urination, even though he has an indwelling catheter.  There has been some blood in the urine.  He denies fever, chills, sweats.  Denies any nausea or vomiting.  He has had some suprapubic pain.   Home Medications Prior to Admission medications   Medication Sig Start Date End Date Taking? Authorizing Provider  cephALEXin (KEFLEX) 500 MG capsule Take 1 capsule (500 mg total) by mouth 3 (three) times daily. 08/20/38  Yes Delora Fuel, MD  acetaminophen (TYLENOL) 325 MG tablet Take 650 mg by mouth every 4 (four) hours as needed for mild pain.    [provider]  albuterol (VENTOLIN HFA) 108 (90 Base) MCG/ACT inhaler Inhale 2 puffs into the lungs every 6 (six) hours as needed for wheezing or shortness of breath. 06/24/21   Terrilee Croak, MD  amLODipine (NORVASC) 10 MG tablet Take 1 tablet (10 mg total) by mouth daily. 07/17/21   [name], NP  clopidogrel (PLAVIX) 75 MG tablet Take 1 tablet (75 mg total) by mouth at bedtime. 07/17/21   [name], NP  divalproex (DEPAKOTE) 125 MG DR tablet Take 1 tablet (125 mg total) by mouth at bedtime. 07/17/21 10/15/21  [name], NP  finasteride (PROSCAR) 5 MG tablet Take 1 tablet (5 mg total) by mouth every morning. 06/24/21 09/22/21  Terrilee Croak, MD  nitroGLYCERIN (NITROSTAT) 0.4 MG SL tablet Place 0.4 mg under the tongue every 5 (five) minutes x 3 doses as needed for chest pain.     [provider]  pantoprazole (PROTONIX) 40 MG tablet TAKE 1 TABLET(40 MG) BY MOUTH DAILY 07/21/21   Charlott Rakes, MD  polyethylene glycol (MIRALAX / GLYCOLAX) 17 g packet Take 17 g by mouth daily as needed for mild constipation. 07/17/21   [name], NP  rosuvastatin (CRESTOR) 10 MG tablet Take 1 tablet (10 mg total) by mouth every morning. 07/17/21   [name], NP  vitamin B-12 (CYANOCOBALAMIN) 1000 MCG tablet Take 1 tablet (1,000 mcg total) by mouth daily. 07/17/21   [name], NP  Vitamin D, Ergocalciferol, (DRISDOL) 1.25 MG (50000 UNIT) CAPS capsule Take 50,000 Units by mouth every Monday.    [provider]      Allergies    Food, Aspirin, and [place] qu[place] clam (m. mercenaria) skin test    Review of Systems   Review of Systems  Genitourinary:  Positive for dysuria and hematuria.  All other systems reviewed and are negative.  Physical Exam Updated Vital Signs BP 121/89    Pulse 61    Temp 98.3 F (36.8 C) (Oral)    Resp 14    SpO2 100%  Physical Exam Vitals and nursing note reviewed.  81 year old male, resting comfortably and in no acute distress. Vital signs are normal. Oxygen saturation is 100%, which is normal. Head is normocephalic and atraumatic. PERRLA, EOMI. Oropharynx is clear. Neck is nontender and supple  without adenopathy or JVD. Back is nontender and there is no CVA tenderness. Lungs are clear without rales, wheezes, or rhonchi. Chest is nontender. Heart has regular rate and rhythm without murmur. Abdomen is soft, flat, with mild suprapubic tenderness. Genitalia: Indwelling Foley catheter in place. Extremities have no cyanosis or edema, full range of motion is present. Skin is warm and dry without rash. Neurologic: Awake and alert, speech is moderately dysarthric, cranial nerves are intact, moves all extremities equally.  ED Results / Procedures / Treatments   Labs (all labs ordered are listed, but only abnormal results  are displayed) Labs Reviewed  CBC WITH DIFFERENTIAL/PLATELET - Abnormal; Notable for the following components:      Result Value   Hemoglobin 10.9 (*)    HCT 36.4 (*)    MCH 24.7 (*)    MCHC 29.9 (*)    RDW 23.0 (*)    All other components within normal limits  COMPREHENSIVE METABOLIC PANEL - Abnormal; Notable for the following components:   Glucose, Bld 106 (*)    Albumin 2.9 (*)    Alkaline Phosphatase 146 (*)    All other components within normal limits  URINALYSIS, ROUTINE W REFLEX MICROSCOPIC - Abnormal; Notable for the following components:   Color, Urine AMBER (*)    APPearance HAZY (*)    Hgb urine dipstick MODERATE (*)    Protein, ur 100 (*)    Nitrite POSITIVE (*)    Leukocytes,Ua SMALL (*)    RBC / HPF >50 (*)    WBC, UA >50 (*)    Bacteria, UA MANY (*)    Non Squamous Epithelial 0-5 (*)    All other components within normal limits  URINE CULTURE   Procedures Procedures    Medications Ordered in ED Medications  cephALEXin (KEFLEX) capsule 500 mg (has no administration in time range)    ED Course/ Medical Decision Making/ A&P                           Medical Decision Making Amount and/or Complexity of Data Reviewed Labs: ordered.  Risk Prescription drug management.   Hematuria in patient with indwelling Foley catheter and history of urinary tract infections.  Old records are reviewed, and he did have a recent urine culture which was positive for Pseudomonas aeruginosa which was actually sensitive to most antibiotics, and Klebsiella pneumoniae which was resistant to most antibiotics.  He is currently afebrile with only mild leukocytosis.  Anemia is present but not significantly changed from prior.  Urinalysis is clearly showing evidence of infection with greater than 50 WBCs, greater than 50 RBCs, many bacteria, positive nitrite.  At this point, I do not feel he needs inpatient care with no fever and no signs of sepsis.  Unlikely to be able to sterilize the  urine and patient with indwelling Foley catheter.  He is discharged with prescription for cephalexin, but given strict return precautions.  If he does return with fever or signs of sepsis, will will need to direct antibiotics to his known Klebsiella infection which is sensitive only to imipenem and piperacillin/tazobactam.        Final Clinical Impression(s) / ED Diagnoses Final diagnoses:  Urinary tract infection with hematuria, site unspecified  Normochromic normocytic anemia    Rx / DC Orders ED Discharge Orders          Ordered    cephALEXin (KEFLEX) 500 MG capsule  3 times daily  07/24/21 9810              Delora Fuel, MD 25/48/62 [phone]

## 2021-07-27 ENCOUNTER — Other Ambulatory Visit: Payer: Self-pay

## 2021-07-27 ENCOUNTER — Ambulatory Visit (INDEPENDENT_AMBULATORY_CARE_PROVIDER_SITE_OTHER): Payer: Medicare Other | Admitting: *Deleted

## 2021-07-27 VITALS — BP 118/57 | HR 65 | Temp 97.5°F | Resp 16 | Ht 72.0 in | Wt 125.8 lb

## 2021-07-27 DIAGNOSIS — D508 Other iron deficiency anemias: Secondary | ICD-10-CM

## 2021-07-27 MED ORDER — DIPHENHYDRAMINE HCL 50 MG/ML IJ SOLN: 50.0000 mg | Freq: Once | INTRAMUSCULAR | Status: DC | PRN

## 2021-07-27 MED ORDER — ALBUTEROL SULFATE HFA 108 (90 BASE) MCG/ACT IN AERS: 2.0000 | INHALATION_SPRAY | Freq: Once | RESPIRATORY_TRACT | Status: DC | PRN

## 2021-07-27 MED ORDER — METHYLPREDNISOLONE SODIUM SUCC 125 MG IJ SOLR: 125.0000 mg | Freq: Once | INTRAMUSCULAR | Status: DC | PRN

## 2021-07-27 MED ORDER — SODIUM CHLORIDE 0.9 % IV SOLN
1000.0000 mg | Freq: Once | INTRAVENOUS | Status: AC
  Administered 2021-07-27: 1000 mg via INTRAVENOUS
  Filled 2021-07-27: qty 10

## 2021-07-27 MED ORDER — SODIUM CHLORIDE 0.9 % IV SOLN: Freq: Once | INTRAVENOUS | Status: DC | PRN

## 2021-07-27 MED ORDER — EPINEPHRINE 0.3 MG/0.3ML IJ SOAJ: 0.3000 mg | Freq: Once | INTRAMUSCULAR | Status: DC | PRN

## 2021-07-27 MED ORDER — FAMOTIDINE IN NACL 20-0.9 MG/50ML-% IV SOLN: 20.0000 mg | Freq: Once | INTRAVENOUS | Status: DC | PRN

## 2021-07-27 NOTE — Progress Notes (Signed)
Diagnosis: Iron Deficiency Anemia  Provider:  Marshell Garfinkel, MD  Procedure: Infusion  IV Type: Peripheral, IV Location: L Antecubital  Monoferric, Dose: 1000 mg  Infusion Start Time: 1771 am  Infusion Stop Time: 1125 am  Post Infusion IV Care: Observation period completed and Peripheral IV Discontinued  Discharge: Condition: Good, Destination: Home . AVS provided to patient.   Performed by:  Oren Beckmann, RN

## 2021-07-28 ENCOUNTER — Encounter: Payer: Self-pay | Admitting: Physician Assistant

## 2021-07-28 ENCOUNTER — Ambulatory Visit (INDEPENDENT_AMBULATORY_CARE_PROVIDER_SITE_OTHER)
Admission: RE | Admit: 2021-07-28 | Discharge: 2021-07-28 | Disposition: A | Discharge status: Home / Self Care | Payer: Medicare Other | Source: Ambulatory Visit | Attending: Vascular Surgery | Admitting: Vascular Surgery

## 2021-07-28 ENCOUNTER — Ambulatory Visit (INDEPENDENT_AMBULATORY_CARE_PROVIDER_SITE_OTHER): Payer: Medicare Other | Admitting: Physician Assistant

## 2021-07-28 ENCOUNTER — Ambulatory Visit (INDEPENDENT_AMBULATORY_CARE_PROVIDER_SITE_OTHER)
Admission: RE | Admit: 2021-07-28 | Discharge: 2021-07-28 | Disposition: A | Discharge status: Home / Self Care | Payer: Medicare Other | Source: Ambulatory Visit

## 2021-07-28 VITALS — Temp 98.2°F | Resp 20 | Ht 72.0 in

## 2021-07-28 DIAGNOSIS — I739 Peripheral vascular disease, unspecified: Secondary | ICD-10-CM | POA: Insufficient documentation

## 2021-07-28 DIAGNOSIS — I70221 Atherosclerosis of native arteries of extremities with rest pain, right leg: Secondary | ICD-10-CM | POA: Diagnosis not present

## 2021-07-28 DIAGNOSIS — I998 Other disorder of circulatory system: Secondary | ICD-10-CM | POA: Diagnosis not present

## 2021-07-28 DIAGNOSIS — T82868A Thrombosis of vascular prosthetic devices, implants and grafts, initial encounter: Secondary | ICD-10-CM | POA: Diagnosis not present

## 2021-07-28 MED ORDER — HYDROCODONE-ACETAMINOPHEN 5-325 MG PO TABS
1.0000 | ORAL_TABLET | Freq: Four times a day (QID) | ORAL | 0 refills | Status: DC | PRN
Start: 2021-07-28 — End: 2021-08-04

## 2021-07-29 ENCOUNTER — Emergency Department (HOSPITAL_COMMUNITY): Payer: Medicare Other

## 2021-07-29 ENCOUNTER — Inpatient Hospital Stay (HOSPITAL_COMMUNITY)
Admission: EM | Admit: 2021-07-29 | Discharge: 2021-08-05 | DRG: 240 | Disposition: A | Discharge status: Skilled Nursing Facility | Payer: Medicare Other | Attending: Family Medicine | Admitting: Family Medicine

## 2021-07-29 ENCOUNTER — Encounter (HOSPITAL_COMMUNITY): Payer: Self-pay

## 2021-07-29 ENCOUNTER — Other Ambulatory Visit: Payer: Self-pay

## 2021-07-29 DIAGNOSIS — I70221 Atherosclerosis of native arteries of extremities with rest pain, right leg: Principal | ICD-10-CM

## 2021-07-29 DIAGNOSIS — I2510 Atherosclerotic heart disease of native coronary artery without angina pectoris: Secondary | ICD-10-CM | POA: Diagnosis present

## 2021-07-29 DIAGNOSIS — I10 Essential (primary) hypertension: Secondary | ICD-10-CM | POA: Diagnosis present

## 2021-07-29 DIAGNOSIS — N400 Enlarged prostate without lower urinary tract symptoms: Secondary | ICD-10-CM | POA: Diagnosis present

## 2021-07-29 DIAGNOSIS — I495 Sick sinus syndrome: Secondary | ICD-10-CM | POA: Diagnosis present

## 2021-07-29 DIAGNOSIS — I998 Other disorder of circulatory system: Secondary | ICD-10-CM | POA: Diagnosis present

## 2021-07-29 DIAGNOSIS — Z7982 Long term (current) use of aspirin: Secondary | ICD-10-CM

## 2021-07-29 DIAGNOSIS — Z95810 Presence of automatic (implantable) cardiac defibrillator: Secondary | ICD-10-CM

## 2021-07-29 DIAGNOSIS — E559 Vitamin D deficiency, unspecified: Secondary | ICD-10-CM | POA: Diagnosis present

## 2021-07-29 DIAGNOSIS — R338 Other retention of urine: Secondary | ICD-10-CM | POA: Diagnosis present

## 2021-07-29 DIAGNOSIS — Z79899 Other long term (current) drug therapy: Secondary | ICD-10-CM

## 2021-07-29 DIAGNOSIS — Y832 Surgical operation with anastomosis, bypass or graft as the cause of abnormal reaction of the patient, or of later complication, without mention of misadventure at the time of the procedure: Secondary | ICD-10-CM | POA: Diagnosis present

## 2021-07-29 DIAGNOSIS — K219 Gastro-esophageal reflux disease without esophagitis: Secondary | ICD-10-CM | POA: Diagnosis present

## 2021-07-29 DIAGNOSIS — Z87891 Personal history of nicotine dependence: Secondary | ICD-10-CM

## 2021-07-29 DIAGNOSIS — E785 Hyperlipidemia, unspecified: Secondary | ICD-10-CM | POA: Diagnosis present

## 2021-07-29 DIAGNOSIS — R339 Retention of urine, unspecified: Secondary | ICD-10-CM

## 2021-07-29 DIAGNOSIS — Z91018 Allergy to other foods: Secondary | ICD-10-CM

## 2021-07-29 DIAGNOSIS — Z8673 Personal history of transient ischemic attack (TIA), and cerebral infarction without residual deficits: Secondary | ICD-10-CM

## 2021-07-29 DIAGNOSIS — T82868A Thrombosis of vascular prosthetic devices, implants and grafts, initial encounter: Principal | ICD-10-CM | POA: Diagnosis present

## 2021-07-29 DIAGNOSIS — Z89411 Acquired absence of right great toe: Secondary | ICD-10-CM

## 2021-07-29 DIAGNOSIS — R636 Underweight: Secondary | ICD-10-CM | POA: Diagnosis present

## 2021-07-29 DIAGNOSIS — Z886 Allergy status to analgesic agent status: Secondary | ICD-10-CM

## 2021-07-29 DIAGNOSIS — E7800 Pure hypercholesterolemia, unspecified: Secondary | ICD-10-CM | POA: Diagnosis present

## 2021-07-29 DIAGNOSIS — Z7902 Long term (current) use of antithrombotics/antiplatelets: Secondary | ICD-10-CM

## 2021-07-29 DIAGNOSIS — Z681 Body mass index (BMI) 19 or less, adult: Secondary | ICD-10-CM

## 2021-07-29 DIAGNOSIS — Z950 Presence of cardiac pacemaker: Secondary | ICD-10-CM | POA: Diagnosis present

## 2021-07-29 DIAGNOSIS — Z20822 Contact with and (suspected) exposure to covid-19: Secondary | ICD-10-CM | POA: Diagnosis present

## 2021-07-29 DIAGNOSIS — N390 Urinary tract infection, site not specified: Secondary | ICD-10-CM | POA: Diagnosis present

## 2021-07-29 DIAGNOSIS — Z91013 Allergy to seafood: Secondary | ICD-10-CM

## 2021-07-29 DIAGNOSIS — N401 Enlarged prostate with lower urinary tract symptoms: Secondary | ICD-10-CM | POA: Diagnosis present

## 2021-07-29 LAB — CBC
HCT: 39.5 % (ref 39.0–52.0)
Hemoglobin: 11.4 g/dL — ABNORMAL LOW (ref 13.0–17.0)
MCH: 24.8 pg — ABNORMAL LOW (ref 26.0–34.0)
MCHC: 28.9 g/dL — ABNORMAL LOW (ref 30.0–36.0)
MCV: 86.1 fL (ref 80.0–100.0)
Platelets: 189 10*3/uL (ref 150–400)
RBC: 4.59 MIL/uL (ref 4.22–5.81)
RDW: 22.4 % — ABNORMAL HIGH (ref 11.5–15.5)
WBC: 5.8 10*3/uL (ref 4.0–10.5)
nRBC: 0 % (ref 0.0–0.2)

## 2021-07-29 LAB — PROTIME-INR
INR: 1.1 (ref 0.8–1.2)
Prothrombin Time: 14 seconds (ref 11.4–15.2)

## 2021-07-29 NOTE — ED Notes (Signed)
Called into triage room after completion by daughter who states pt is getting harder to wake up and he's not responding like normal. Agricultural consultant notified.

## 2021-07-29 NOTE — ED Triage Notes (Signed)
Pt presents to the ED from home with complaints of right leg pain since Friday. October had bypass to right tibia and had right great toe injury with removal. Since Friday complaining of right leg pain and wanting family to pull his leg to help with the pain. Pt hit right 2nd toe on shower door and now it is black in color. Saw PMD yesterday and they want to remove whole leg up to knee due to failed bypass. Pt complaining of pain in foot and leg, placed on hydrocodone and now he's not acting like himself.

## 2021-07-29 NOTE — ED Provider Notes (Signed)
[facility] EMERGENCY DEPARTMENT Provider Note   CSN: [id number] Arrival date & time: 07/29/21  2104     History  Chief Complaint  Patient presents with   Leg Pain    Todd Dunn is a 81 y.o. male.  81 yo M with a cc of R foot pain.  Patient was just seen by vascular yesterday, brought not by the POA.  Family now concerned with the fact that he has an arterial occlusion.     Leg Pain     Home Medications Prior to Admission medications   Medication Sig Start Date End Date Taking? Authorizing Provider  acetaminophen (TYLENOL) 325 MG tablet Take 650 mg by mouth every 4 (four) hours as needed for mild pain.    [provider]  albuterol (VENTOLIN HFA) 108 (90 Base) MCG/ACT inhaler Inhale 2 puffs into the lungs every 6 (six) hours as needed for wheezing or shortness of breath. 06/24/21   Terrilee Croak, MD  amLODipine (NORVASC) 10 MG tablet Take 1 tablet (10 mg total) by mouth daily. 07/17/21   [name], NP  cephALEXin (KEFLEX) 500 MG capsule Take 1 capsule (500 mg total) by mouth 3 (three) times daily. 6/76/72   Delora Fuel, MD  clopidogrel (PLAVIX) 75 MG tablet Take 1 tablet (75 mg total) by mouth at bedtime. 07/17/21   [name], NP  divalproex (DEPAKOTE) 125 MG DR tablet Take 1 tablet (125 mg total) by mouth at bedtime. 07/17/21 10/15/21  [name], NP  finasteride (PROSCAR) 5 MG tablet Take 1 tablet (5 mg total) by mouth every morning. 06/24/21 09/22/21  Terrilee Croak, MD  HYDROcodone-acetaminophen (NORCO) 5-325 MG tablet Take 1 tablet by mouth every 6 (six) hours as needed for moderate pain. 07/28/21   Rhyne, Hulen Shouts, PA-C  nitrofurantoin, macrocrystal-monohydrate, (MACROBID) 100 MG capsule Take 100 mg by mouth 2 (two) times daily. 07/16/21   [provider]  nitroGLYCERIN (NITROSTAT) 0.4 MG SL tablet Place 0.4 mg under the tongue every 5 (five) minutes x 3 doses as needed for chest pain.    [provider]   pantoprazole (PROTONIX) 40 MG tablet TAKE 1 TABLET(40 MG) BY MOUTH DAILY 07/21/21   Charlott Rakes, MD  polyethylene glycol (MIRALAX / GLYCOLAX) 17 g packet Take 17 g by mouth daily as needed for mild constipation. 07/17/21   [name], NP  rosuvastatin (CRESTOR) 10 MG tablet Take 1 tablet (10 mg total) by mouth every morning. 07/17/21   [name], NP  tetracycline (SUMYCIN) 500 MG capsule Take 500 mg by mouth every 12 (twelve) hours. 07/21/21   [provider]  vitamin B-12 (CYANOCOBALAMIN) 1000 MCG tablet Take 1 tablet (1,000 mcg total) by mouth daily. 07/17/21   [name], NP  Vitamin D, Ergocalciferol, (DRISDOL) 1.25 MG (50000 UNIT) CAPS capsule Take 50,000 Units by mouth every Monday.    [provider]      Allergies    Food, Aspirin, and [place] qu[place] clam (m. mercenaria) skin test    Review of Systems   Review of Systems  Physical Exam Updated Vital Signs BP 126/76    Pulse 86    Temp 97.7 F (36.5 C)    Resp 20    Ht 6' (1.829 m)    Wt 59 kg    SpO2 100%    BMI 17.63 kg/m  Physical Exam Vitals and nursing note reviewed.  Constitutional:      Appearance: He is  well-developed.  HENT:     Head: Normocephalic and atraumatic.  Eyes:     Pupils: Pupils are equal, round, and reactive to light.  Neck:     Vascular: No JVD.  Cardiovascular:     Rate and Rhythm: Normal rate and regular rhythm.     Heart sounds: No murmur heard.   No friction rub. No gallop.  Pulmonary:     Effort: No respiratory distress.     Breath sounds: No wheezing.  Abdominal:     General: There is no distension.     Tenderness: There is no abdominal tenderness. There is no guarding or rebound.  Musculoskeletal:        General: Normal range of motion.     Cervical back: Normal range of motion and neck supple.     Comments: Dusky appearance to the right second digit.  Old laceration to the DIP of the right second digit.  Scar with no obvious break in the skin of the  right first digit.  No palpable pulses.  Skin:    Coloration: Skin is not pale.     Findings: No rash.  Neurological:     Mental Status: He is alert.  Psychiatric:        Behavior: Behavior normal.    ED Results / Procedures / Treatments   Labs (all labs ordered are listed, but only abnormal results are displayed) Labs Reviewed  CBC - Abnormal; Notable for the following components:      Result Value   Hemoglobin 11.4 (*)    MCH 24.8 (*)    MCHC 28.9 (*)    RDW 22.4 (*)    All other components within normal limits  CK - Abnormal; Notable for the following components:   Total CK 44 (*)    All other components within normal limits  COMPREHENSIVE METABOLIC PANEL - Abnormal; Notable for the following components:   Glucose, Bld 129 (*)    Total Protein 6.1 (*)    Albumin 2.9 (*)    AST 14 (*)    Alkaline Phosphatase 139 (*)    All other components within normal limits  RESP PANEL BY RT-PCR (FLU A&B, COVID) ARPGX2  PROTIME-INR    EKG None  Radiology DG Foot Complete Right  Result Date: 07/29/2021 CLINICAL DATA:  Foot injury. Leg pain. Struck second toe in the shower, black in color. EXAM: RIGHT FOOT COMPLETE - 3+ VIEW COMPARISON:  None. FINDINGS: Prior resection of the first ray. Hammertoe deformity of the digits limits assessment. There is no evidence of acute fracture. Mild degenerative change of the midfoot. No erosion, bony destruction, or periosteal reaction. No soft tissue gas. No radiopaque foreign body IMPRESSION: Prior resection of the first ray. No acute fracture or subluxation. Electronically Signed   By: Keith Rake M.D.   On: 07/29/2021 22:05   VAS [place] ABI WITH/WO TBI  Result Date: 07/28/2021  LOWER EXTREMITY DOPPLER STUDY Patient Name:  Todd Dunn  Date of Exam:   07/28/2021 Medical Rec #: [id number]           Accession #:    [id number] Date of Birth: Feb 25, 1941           Patient Gender: M Patient Age:   87 years Exam Location:  [facility] Procedure:      VAS [place] ABI WITH/WO TBI Referring Phys: Risa Grill --------------------------------------------------------------------------------  Indications: Peripheral artery disease. High Risk         Hypertension,  hyperlipidemia, past history of smoking, prior Factors:          CVA.  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 Right common                         femoral to posterior tibial artery bypass graft                         03/26/2021. Performing Technologist: Delorise Shiner RVT  Examination Guidelines: A complete evaluation includes at minimum, Doppler waveform signals and systolic blood pressure reading at the level of bilateral brachial, anterior tibial, and posterior tibial arteries, when vessel segments are accessible. Bilateral testing is considered an integral part of a complete examination. Photoelectric Plethysmograph (PPG) waveforms and toe systolic pressure readings are included as required and additional duplex testing as needed. Limited examinations for reoccurring indications may be performed as noted.  ABI Findings: +---------+------------------+-----+-------------------+----------+  Right     Rt Pressure (mmHg) Index Waveform            Comment     +---------+------------------+-----+-------------------+----------+  Brachial  134                                                      +---------+------------------+-----+-------------------+----------+  PTA       30                 0.22  dampened monophasic             +---------+------------------+-----+-------------------+----------+  DP        0                  0.00  absent                          +---------+------------------+-----+-------------------+----------+  Great Toe                                              amputation  +---------+------------------+-----+-------------------+----------+ +---------+------------------+-----+-------------------+-------+  Left      Lt Pressure  (mmHg) Index Waveform            Comment  +---------+------------------+-----+-------------------+-------+  Brachial  134                                                   +---------+------------------+-----+-------------------+-------+  PTA       54                 0.40  dampened monophasic          +---------+------------------+-----+-------------------+-------+  DP        36                 0.27  dampened monophasic          +---------+------------------+-----+-------------------+-------+  Great Toe 42                 0.31                               +---------+------------------+-----+-------------------+-------+ +-------+-----------+-----------+------------+------------+  ABI/TBI Today's ABI Today's TBI Previous ABI Previous TBI  +-------+-----------+-----------+------------+------------+  Right   0.22        amputation  1.09         amputation    +-------+-----------+-----------+------------+------------+  Left    0.40        0.31        0.74         0.59          +-------+-----------+-----------+------------+------------+ Bilateral ABIs appear decreased compared to prior study on 04/28/2021.  Summary: Right: Resting right ankle-brachial index indicates severe right lower extremity arterial disease. Left: Resting left ankle-brachial index indicates severe left lower extremity arterial disease. The left toe-brachial index is abnormal.  *See table(s) above for measurements and observations.  Electronically signed by Monica Martinez MD on 07/28/2021 at 11:53:59 AM.    Final    VAS [place] LOWER EXTREMITY BYPASS GRAFT DUPL  Result Date: 07/28/2021 LOWER EXTREMITY ARTERIAL DUPLEX STUDY Patient Name:  Todd Dunn  Date of Exam:   07/28/2021 Medical Rec #: [id number]           Accession #:    [id number] Date of Birth: [birth year]           Patient Gender: M Patient Age:   4 years Exam Location:  [facility] Procedure:      VAS [place] LOWER EXTREMITY BYPASS GRAFT DUPLEX Referring Phys: Risa Grill --------------------------------------------------------------------------------  Indications: Peripheral artery disease. High Risk         Hypertension, hyperlipidemia, past history of smoking, prior Factors:          CVA.  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 Right common                         femoral to posterior tibial artery bypass graft                         03/26/2021. Current ABI:            Right: 0.22, Left: 0.40 Performing Technologist: Delorise Shiner RVT  Examination Guidelines: A complete evaluation includes B-mode imaging, spectral Doppler, color Doppler, and power Doppler as needed of all accessible portions of each vessel. Bilateral testing is considered an integral part of a complete examination. Limited examinations for reoccurring indications may be performed as noted.  +----------+--------+-----+--------+--------+--------+  RIGHT      PSV cm/s Ratio Stenosis Waveform Comments  +----------+--------+-----+--------+--------+--------+  CFA Distal 64                      biphasic           +----------+--------+-----+--------+--------+--------+  Right Graft #1: Right fem-posterior tibial +------------------+--------+--------+--------+--------+                     PSV cm/s Stenosis Waveform Comments  +------------------+--------+--------+--------+--------+  Inflow             47                biphasic           +------------------+--------+--------+--------+--------+  Prox Anastomosis   73                biphasic           +------------------+--------+--------+--------+--------+  Proximal Graft     0                                    +------------------+--------+--------+--------+--------+  Mid Graft                                               +------------------+--------+--------+--------+--------+  Distal Graft       0                                    +------------------+--------+--------+--------+--------+  Distal Anastomosis 0                                     +------------------+--------+--------+--------+--------+  Outflow            0                                    +------------------+--------+--------+--------+--------+ Graft appears to be occluded/ thrombosed.   Summary: Right: Occluded right fem to posterior tibial artery bypass graft.  See table(s) above for measurements and observations. Electronically signed by Monica Martinez MD on 07/28/2021 at 11:54:16 AM.    Final     Procedures Procedures    Medications Ordered in ED Medications - No data to display  ED Course/ Medical Decision Making/ A&P                           Medical Decision Making Amount and/or Complexity of Data Reviewed Labs: ordered. Radiology: ordered.  Risk Decision regarding hospitalization.   81 yo M with a chief complaints of right foot and leg pain.  The patient's power of attorney saw him today and reviewed his visit from yesterday on my chart and realized that he has a vascular occlusion.  He apparently has been complaining of pain to that leg for at least a month and they have been taking Tylenol as needed.  She felt that he has not been able to get up and move around at all today because of pain to the leg.  They have been trying pain medicine that was prescribed at yesterday's visit including 3 doses without significant improvement.  She called the nursing hotline who suggested that they come to the emergency department.  They deny recurrent trauma.  He was being seen at the vascular surgeons office for slamming his foot in the shower door a few weeks ago.  I reviewed the patient's charts and per the notes he was seen yesterday and thought likely needed a amputation.  Family was not willing to consent at the time and plan was to set up an appointment to rediscuss.  Family is now a bit more concerned and do feel like he needs a procedure done.  Plain film independently interpreted by me with out acute fracture.  I discussed the case with Dr. Trula Slade,  vascular surgery as the patient is having increased pain and known occlusion and he recommended having the hospitalist service admitted and vascular wound try to perform an above-the-knee amputation when they were able to get him into the operating room.  Recommend making n.p.o. at midnight.  Patient's lab work is returned without significant anemia no significant electrolyte abnormality.  Will discuss with medicine.   The patients results and plan were reviewed and discussed.  Any x-rays performed were independently reviewed by myself.   Differential diagnosis were considered with the presenting HPI.  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           Final Clinical Impression(s) / ED Diagnoses Final diagnoses:  Critical limb ischemia of right lower extremity [facility])    Rx / DC Orders ED Discharge Orders     None         Deno Etienne, DO 07/30/21 0022

## 2021-07-30 ENCOUNTER — Inpatient Hospital Stay (HOSPITAL_COMMUNITY): Payer: Medicare Other | Admitting: Certified Registered Nurse Anesthetist

## 2021-07-30 ENCOUNTER — Other Ambulatory Visit: Payer: Self-pay

## 2021-07-30 ENCOUNTER — Encounter (HOSPITAL_COMMUNITY)
Admission: EM | Disposition: A | Discharge status: Skilled Nursing Facility | Payer: Self-pay | Source: Home / Self Care | Attending: Family Medicine

## 2021-07-30 ENCOUNTER — Encounter (HOSPITAL_COMMUNITY): Payer: Self-pay | Admitting: Internal Medicine

## 2021-07-30 DIAGNOSIS — R338 Other retention of urine: Secondary | ICD-10-CM | POA: Diagnosis present

## 2021-07-30 DIAGNOSIS — E559 Vitamin D deficiency, unspecified: Secondary | ICD-10-CM | POA: Diagnosis present

## 2021-07-30 DIAGNOSIS — I10 Essential (primary) hypertension: Secondary | ICD-10-CM

## 2021-07-30 DIAGNOSIS — Z89411 Acquired absence of right great toe: Secondary | ICD-10-CM | POA: Diagnosis not present

## 2021-07-30 DIAGNOSIS — Z681 Body mass index (BMI) 19 or less, adult: Secondary | ICD-10-CM | POA: Diagnosis not present

## 2021-07-30 DIAGNOSIS — I2510 Atherosclerotic heart disease of native coronary artery without angina pectoris: Secondary | ICD-10-CM

## 2021-07-30 DIAGNOSIS — I70221 Atherosclerosis of native arteries of extremities with rest pain, right leg: Secondary | ICD-10-CM

## 2021-07-30 DIAGNOSIS — I495 Sick sinus syndrome: Secondary | ICD-10-CM | POA: Diagnosis present

## 2021-07-30 DIAGNOSIS — Z7982 Long term (current) use of aspirin: Secondary | ICD-10-CM | POA: Diagnosis not present

## 2021-07-30 DIAGNOSIS — E785 Hyperlipidemia, unspecified: Secondary | ICD-10-CM | POA: Diagnosis not present

## 2021-07-30 DIAGNOSIS — I998 Other disorder of circulatory system: Secondary | ICD-10-CM

## 2021-07-30 DIAGNOSIS — Z95810 Presence of automatic (implantable) cardiac defibrillator: Secondary | ICD-10-CM | POA: Diagnosis not present

## 2021-07-30 DIAGNOSIS — Z91018 Allergy to other foods: Secondary | ICD-10-CM | POA: Diagnosis not present

## 2021-07-30 DIAGNOSIS — T82868A Thrombosis of vascular prosthetic devices, implants and grafts, initial encounter: Secondary | ICD-10-CM | POA: Diagnosis present

## 2021-07-30 DIAGNOSIS — Z20822 Contact with and (suspected) exposure to covid-19: Secondary | ICD-10-CM | POA: Diagnosis present

## 2021-07-30 DIAGNOSIS — Z8673 Personal history of transient ischemic attack (TIA), and cerebral infarction without residual deficits: Secondary | ICD-10-CM | POA: Diagnosis not present

## 2021-07-30 DIAGNOSIS — N390 Urinary tract infection, site not specified: Secondary | ICD-10-CM | POA: Diagnosis present

## 2021-07-30 DIAGNOSIS — K219 Gastro-esophageal reflux disease without esophagitis: Secondary | ICD-10-CM

## 2021-07-30 DIAGNOSIS — Z886 Allergy status to analgesic agent status: Secondary | ICD-10-CM | POA: Diagnosis not present

## 2021-07-30 DIAGNOSIS — E7800 Pure hypercholesterolemia, unspecified: Secondary | ICD-10-CM | POA: Diagnosis present

## 2021-07-30 DIAGNOSIS — N401 Enlarged prostate with lower urinary tract symptoms: Secondary | ICD-10-CM | POA: Diagnosis present

## 2021-07-30 DIAGNOSIS — Z87891 Personal history of nicotine dependence: Secondary | ICD-10-CM | POA: Diagnosis not present

## 2021-07-30 DIAGNOSIS — Z91013 Allergy to seafood: Secondary | ICD-10-CM | POA: Diagnosis not present

## 2021-07-30 DIAGNOSIS — Z79899 Other long term (current) drug therapy: Secondary | ICD-10-CM | POA: Diagnosis not present

## 2021-07-30 DIAGNOSIS — R636 Underweight: Secondary | ICD-10-CM | POA: Diagnosis present

## 2021-07-30 DIAGNOSIS — Z7902 Long term (current) use of antithrombotics/antiplatelets: Secondary | ICD-10-CM | POA: Diagnosis not present

## 2021-07-30 DIAGNOSIS — Y832 Surgical operation with anastomosis, bypass or graft as the cause of abnormal reaction of the patient, or of later complication, without mention of misadventure at the time of the procedure: Secondary | ICD-10-CM | POA: Diagnosis present

## 2021-07-30 HISTORY — PX: AMPUTATION: SHX166

## 2021-07-30 LAB — URINALYSIS, ROUTINE W REFLEX MICROSCOPIC
Bilirubin Urine: NEGATIVE
Glucose, UA: NEGATIVE mg/dL
Ketones, ur: NEGATIVE mg/dL
Nitrite: NEGATIVE
Protein, ur: NEGATIVE mg/dL
Specific Gravity, Urine: 1.025 (ref 1.005–1.030)
pH: 6 (ref 5.0–8.0)

## 2021-07-30 LAB — RESP PANEL BY RT-PCR (FLU A&B, COVID) ARPGX2
Influenza A by PCR: NEGATIVE
Influenza B by PCR: NEGATIVE
SARS Coronavirus 2 by RT PCR: NEGATIVE

## 2021-07-30 LAB — COMPREHENSIVE METABOLIC PANEL
ALT: 12 U/L (ref 0–44)
AST: 14 U/L — ABNORMAL LOW (ref 15–41)
Albumin: 2.9 g/dL — ABNORMAL LOW (ref 3.5–5.0)
Alkaline Phosphatase: 139 U/L — ABNORMAL HIGH (ref 38–126)
Anion gap: 6 (ref 5–15)
BUN: 19 mg/dL (ref 8–23)
CO2: 31 mmol/L (ref 22–32)
Calcium: 9.7 mg/dL (ref 8.9–10.3)
Chloride: 103 mmol/L (ref 98–111)
Creatinine, Ser: 1.02 mg/dL (ref 0.61–1.24)
GFR, Estimated: >60 mL/min (ref >60)
Glucose, Bld: 129 mg/dL — ABNORMAL HIGH (ref 70–99)
Potassium: 3.6 mmol/L (ref 3.5–5.1)
Sodium: 140 mmol/L (ref 135–145)
Total Bilirubin: 0.8 mg/dL (ref 0.3–1.2)
Total Protein: 6.1 g/dL — ABNORMAL LOW (ref 6.5–8.1)

## 2021-07-30 LAB — BASIC METABOLIC PANEL
Anion gap: 8 (ref 5–15)
BUN: 14 mg/dL (ref 8–23)
CO2: 26 mmol/L (ref 22–32)
Calcium: 9.5 mg/dL (ref 8.9–10.3)
Chloride: 106 mmol/L (ref 98–111)
Creatinine, Ser: 0.72 mg/dL (ref 0.61–1.24)
GFR, Estimated: >60 mL/min (ref >60)
Glucose, Bld: 121 mg/dL — ABNORMAL HIGH (ref 70–99)
Potassium: 3.8 mmol/L (ref 3.5–5.1)
Sodium: 140 mmol/L (ref 135–145)

## 2021-07-30 LAB — CBC
HCT: 36.7 % — ABNORMAL LOW (ref 39.0–52.0)
Hemoglobin: 11.1 g/dL — ABNORMAL LOW (ref 13.0–17.0)
MCH: 25.3 pg — ABNORMAL LOW (ref 26.0–34.0)
MCHC: 30.2 g/dL (ref 30.0–36.0)
MCV: 83.8 fL (ref 80.0–100.0)
Platelets: 174 10*3/uL (ref 150–400)
RBC: 4.38 MIL/uL (ref 4.22–5.81)
RDW: 22.4 % — ABNORMAL HIGH (ref 11.5–15.5)
WBC: 6.9 10*3/uL (ref 4.0–10.5)
nRBC: 0 % (ref 0.0–0.2)

## 2021-07-30 LAB — URINALYSIS, MICROSCOPIC (REFLEX)

## 2021-07-30 LAB — CK: Total CK: 44 U/L — ABNORMAL LOW (ref 49–397)

## 2021-07-30 SURGERY — AMPUTATION, ABOVE KNEE
Anesthesia: General | Site: Knee | Laterality: Right

## 2021-07-30 MED ORDER — ONDANSETRON HCL 4 MG PO TABS: 4.0000 mg | ORAL_TABLET | Freq: Four times a day (QID) | ORAL | Status: DC | PRN

## 2021-07-30 MED ORDER — ACETAMINOPHEN 650 MG RE SUPP: 650.0000 mg | Freq: Four times a day (QID) | RECTAL | Status: DC | PRN

## 2021-07-30 MED ORDER — CHLORHEXIDINE GLUCONATE 0.12 % MT SOLN
15.0000 mL | Freq: Once | OROMUCOSAL | Status: AC
  Administered 2021-07-30: 15 mL via OROMUCOSAL
  Filled 2021-07-30: qty 15

## 2021-07-30 MED ORDER — PHENYLEPHRINE HCL-NACL 20-0.9 MG/250ML-% IV SOLN
INTRAVENOUS | Status: DC | PRN
Start: 2021-07-30 — End: 2021-07-30
  Administered 2021-07-30: 40 ug/min via INTRAVENOUS

## 2021-07-30 MED ORDER — DEXAMETHASONE SODIUM PHOSPHATE 10 MG/ML IJ SOLN
INTRAMUSCULAR | Status: AC
  Filled 2021-07-30: qty 1

## 2021-07-30 MED ORDER — HEMOSTATIC AGENTS (NO CHARGE) OPTIME
TOPICAL | Status: DC | PRN
  Administered 2021-07-30: 1 via TOPICAL

## 2021-07-30 MED ORDER — ONDANSETRON HCL 4 MG/2ML IJ SOLN
INTRAMUSCULAR | Status: AC
  Filled 2021-07-30: qty 2

## 2021-07-30 MED ORDER — LACTATED RINGERS IV SOLN: INTRAVENOUS | Status: DC

## 2021-07-30 MED ORDER — SUGAMMADEX SODIUM 200 MG/2ML IV SOLN
INTRAVENOUS | Status: DC | PRN
  Administered 2021-07-30: 150 mg via INTRAVENOUS

## 2021-07-30 MED ORDER — FINASTERIDE 5 MG PO TABS
5.0000 mg | ORAL_TABLET | Freq: Every morning | ORAL | Status: DC
  Administered 2021-07-30 – 2021-08-05 (×7): 5 mg via ORAL
  Filled 2021-07-30 (×7): qty 1

## 2021-07-30 MED ORDER — PHENYLEPHRINE 40 MCG/ML (10ML) SYRINGE FOR IV PUSH (FOR BLOOD PRESSURE SUPPORT)
PREFILLED_SYRINGE | INTRAVENOUS | Status: DC | PRN
  Administered 2021-07-30: 80 ug via INTRAVENOUS
  Administered 2021-07-30: 40 ug via INTRAVENOUS

## 2021-07-30 MED ORDER — MAGNESIUM HYDROXIDE 400 MG/5ML PO SUSP
30.0000 mL | Freq: Every day | ORAL | Status: DC | PRN
  Filled 2021-07-30: qty 30

## 2021-07-30 MED ORDER — CEFAZOLIN SODIUM 1 G IJ SOLR
INTRAMUSCULAR | Status: AC
  Filled 2021-07-30: qty 20

## 2021-07-30 MED ORDER — MORPHINE SULFATE (PF) 2 MG/ML IV SOLN
2.0000 mg | INTRAVENOUS | Status: DC | PRN
  Administered 2021-07-30 – 2021-08-05 (×15): 2 mg via INTRAVENOUS
  Filled 2021-07-30 (×16): qty 1

## 2021-07-30 MED ORDER — CLOPIDOGREL BISULFATE 75 MG PO TABS: 75.0000 mg | ORAL_TABLET | Freq: Every day | ORAL | Status: DC

## 2021-07-30 MED ORDER — ACETAMINOPHEN 325 MG PO TABS: 650.0000 mg | ORAL_TABLET | Freq: Four times a day (QID) | ORAL | Status: DC | PRN

## 2021-07-30 MED ORDER — EPHEDRINE SULFATE-NACL 50-0.9 MG/10ML-% IV SOSY
PREFILLED_SYRINGE | INTRAVENOUS | Status: DC | PRN
  Administered 2021-07-30: 5 mg via INTRAVENOUS

## 2021-07-30 MED ORDER — ALBUMIN HUMAN 5 % IV SOLN: INTRAVENOUS | Status: DC | PRN

## 2021-07-30 MED ORDER — HYDROCODONE-ACETAMINOPHEN 5-325 MG PO TABS
1.0000 | ORAL_TABLET | Freq: Four times a day (QID) | ORAL | Status: DC | PRN
  Administered 2021-07-30 – 2021-08-05 (×15): 1 via ORAL
  Filled 2021-07-30 (×17): qty 1

## 2021-07-30 MED ORDER — POTASSIUM CHLORIDE IN NACL 20-0.9 MEQ/L-% IV SOLN
INTRAVENOUS | Status: DC
  Filled 2021-07-30 (×10): qty 1000

## 2021-07-30 MED ORDER — AMLODIPINE BESYLATE 10 MG PO TABS
10.0000 mg | ORAL_TABLET | Freq: Every day | ORAL | Status: DC
  Administered 2021-07-30 – 2021-08-05 (×7): 10 mg via ORAL
  Filled 2021-07-30 (×5): qty 1
  Filled 2021-07-30: qty 2
  Filled 2021-07-30: qty 1

## 2021-07-30 MED ORDER — ROCURONIUM BROMIDE 10 MG/ML (PF) SYRINGE
PREFILLED_SYRINGE | INTRAVENOUS | Status: DC | PRN
  Administered 2021-07-30: 50 mg via INTRAVENOUS

## 2021-07-30 MED ORDER — ALBUTEROL SULFATE (2.5 MG/3ML) 0.083% IN NEBU: 3.0000 mL | INHALATION_SOLUTION | Freq: Four times a day (QID) | RESPIRATORY_TRACT | Status: DC | PRN

## 2021-07-30 MED ORDER — CEPHALEXIN 500 MG PO CAPS
500.0000 mg | ORAL_CAPSULE | Freq: Three times a day (TID) | ORAL | Status: DC
  Administered 2021-07-30 – 2021-08-05 (×18): 500 mg via ORAL
  Filled 2021-07-30 (×11): qty 1
  Filled 2021-07-30: qty 2
  Filled 2021-07-30 (×6): qty 1

## 2021-07-30 MED ORDER — ETOMIDATE 2 MG/ML IV SOLN
INTRAVENOUS | Status: DC | PRN
  Administered 2021-07-30: 10 mg via INTRAVENOUS

## 2021-07-30 MED ORDER — LIDOCAINE 2% (20 MG/ML) 5 ML SYRINGE
INTRAMUSCULAR | Status: DC | PRN
  Administered 2021-07-30: 100 mg via INTRAVENOUS

## 2021-07-30 MED ORDER — ONDANSETRON HCL 4 MG/2ML IJ SOLN: 4.0000 mg | Freq: Four times a day (QID) | INTRAMUSCULAR | Status: DC | PRN

## 2021-07-30 MED ORDER — ROSUVASTATIN CALCIUM 5 MG PO TABS
10.0000 mg | ORAL_TABLET | Freq: Every morning | ORAL | Status: DC
  Administered 2021-07-30 – 2021-08-05 (×7): 10 mg via ORAL
  Filled 2021-07-30 (×7): qty 2

## 2021-07-30 MED ORDER — FENTANYL CITRATE (PF) 100 MCG/2ML IJ SOLN
25.0000 ug | INTRAMUSCULAR | Status: DC | PRN
  Administered 2021-07-30 (×4): 25 ug via INTRAVENOUS

## 2021-07-30 MED ORDER — PANTOPRAZOLE SODIUM 40 MG PO TBEC
40.0000 mg | DELAYED_RELEASE_TABLET | Freq: Every day | ORAL | Status: DC
  Administered 2021-07-30 – 2021-08-05 (×7): 40 mg via ORAL
  Filled 2021-07-30 (×7): qty 1

## 2021-07-30 MED ORDER — EPHEDRINE 5 MG/ML INJ
INTRAVENOUS | Status: AC
  Filled 2021-07-30: qty 5

## 2021-07-30 MED ORDER — DIVALPROEX SODIUM 125 MG PO DR TAB
125.0000 mg | DELAYED_RELEASE_TABLET | Freq: Every day | ORAL | Status: DC
  Administered 2021-07-30 – 2021-08-04 (×7): 125 mg via ORAL
  Filled 2021-07-30 (×8): qty 1

## 2021-07-30 MED ORDER — POLYETHYLENE GLYCOL 3350 17 G PO PACK
17.0000 g | PACK | Freq: Every day | ORAL | Status: DC | PRN
  Administered 2021-08-04: 17 g via ORAL
  Filled 2021-07-30: qty 1

## 2021-07-30 MED ORDER — VITAMIN B-12 1000 MCG PO TABS
1000.0000 ug | ORAL_TABLET | Freq: Every day | ORAL | Status: DC
  Administered 2021-07-30 – 2021-08-05 (×7): 1000 ug via ORAL
  Filled 2021-07-30 (×7): qty 1

## 2021-07-30 MED ORDER — NITROGLYCERIN 0.4 MG SL SUBL: 0.4000 mg | SUBLINGUAL_TABLET | SUBLINGUAL | Status: DC | PRN

## 2021-07-30 MED ORDER — 0.9 % SODIUM CHLORIDE (POUR BTL) OPTIME
TOPICAL | Status: DC | PRN
  Administered 2021-07-30: 1000 mL

## 2021-07-30 MED ORDER — ENOXAPARIN SODIUM 40 MG/0.4ML IJ SOSY
40.0000 mg | PREFILLED_SYRINGE | INTRAMUSCULAR | Status: DC
  Administered 2021-07-30 – 2021-08-04 (×5): 40 mg via SUBCUTANEOUS
  Filled 2021-07-30 (×6): qty 0.4

## 2021-07-30 MED ORDER — FENTANYL CITRATE (PF) 250 MCG/5ML IJ SOLN
INTRAMUSCULAR | Status: DC | PRN
Start: 2021-07-30 — End: 2021-07-30
  Administered 2021-07-30: 50 ug via INTRAVENOUS

## 2021-07-30 MED ORDER — DEXAMETHASONE SODIUM PHOSPHATE 10 MG/ML IJ SOLN
INTRAMUSCULAR | Status: DC | PRN
  Administered 2021-07-30: 5 mg via INTRAVENOUS

## 2021-07-30 MED ORDER — FENTANYL CITRATE (PF) 250 MCG/5ML IJ SOLN
INTRAMUSCULAR | Status: AC
  Filled 2021-07-30: qty 5

## 2021-07-30 MED ORDER — PROPOFOL 10 MG/ML IV BOLUS
INTRAVENOUS | Status: AC
  Filled 2021-07-30: qty 20

## 2021-07-30 MED ORDER — CEFAZOLIN SODIUM-DEXTROSE 2-3 GM-%(50ML) IV SOLR
INTRAVENOUS | Status: DC | PRN
  Administered 2021-07-30: 2 g via INTRAVENOUS

## 2021-07-30 MED ORDER — ONDANSETRON HCL 4 MG/2ML IJ SOLN
INTRAMUSCULAR | Status: DC | PRN
  Administered 2021-07-30: 4 mg via INTRAVENOUS

## 2021-07-30 MED ORDER — ACETAMINOPHEN 325 MG PO TABS: 650.0000 mg | ORAL_TABLET | ORAL | Status: DC | PRN

## 2021-07-30 MED ORDER — FENTANYL CITRATE (PF) 100 MCG/2ML IJ SOLN
INTRAMUSCULAR | Status: AC
  Filled 2021-07-30: qty 2

## 2021-07-30 MED ORDER — ORAL CARE MOUTH RINSE: 15.0000 mL | Freq: Once | OROMUCOSAL | Status: AC

## 2021-07-30 MED ORDER — VITAMIN D (ERGOCALCIFEROL) 1.25 MG (50000 UNIT) PO CAPS
50000.0000 [IU] | ORAL_CAPSULE | ORAL | Status: DC
  Administered 2021-08-03: 50000 [IU] via ORAL
  Filled 2021-07-30: qty 1

## 2021-07-30 MED ORDER — ROCURONIUM BROMIDE 10 MG/ML (PF) SYRINGE
PREFILLED_SYRINGE | INTRAVENOUS | Status: AC
  Filled 2021-07-30: qty 10

## 2021-07-30 MED ORDER — TRAZODONE HCL 50 MG PO TABS
25.0000 mg | ORAL_TABLET | Freq: Every evening | ORAL | Status: DC | PRN
  Administered 2021-08-03: 25 mg via ORAL
  Filled 2021-07-30: qty 1

## 2021-07-30 MED ORDER — CHLORHEXIDINE GLUCONATE CLOTH 2 % EX PADS
6.0000 | MEDICATED_PAD | Freq: Every day | CUTANEOUS | Status: DC
  Administered 2021-07-31 – 2021-08-04 (×5): 6 via TOPICAL

## 2021-07-30 MED ORDER — LIDOCAINE 2% (20 MG/ML) 5 ML SYRINGE
INTRAMUSCULAR | Status: AC
  Filled 2021-07-30: qty 5

## 2021-07-30 SURGICAL SUPPLY — 59 items
BAG COUNTER SPONGE SURGICOUNT (BAG) ×2 IMPLANT
BAG SPNG CNTER NS LX DISP (BAG) ×1
BANDAGE ESMARK 6X9 LF (GAUZE/BANDAGES/DRESSINGS) ×1 IMPLANT
BLADE SAW SAG 73X25 THK (BLADE) ×1
BLADE SAW SGTL 73X25 THK (BLADE) ×1 IMPLANT
BNDG CMPR 9X6 STRL LF SNTH (GAUZE/BANDAGES/DRESSINGS) ×1
BNDG COHESIVE 6X5 TAN STRL LF (GAUZE/BANDAGES/DRESSINGS) ×2 IMPLANT
BNDG ELASTIC 4X5.8 VLCR STR LF (GAUZE/BANDAGES/DRESSINGS) ×2 IMPLANT
BNDG ELASTIC 6X5.8 VLCR STR LF (GAUZE/BANDAGES/DRESSINGS) ×2 IMPLANT
BNDG ESMARK 6X9 LF (GAUZE/BANDAGES/DRESSINGS) ×2
BNDG GAUZE ELAST 4 BULKY (GAUZE/BANDAGES/DRESSINGS) ×3 IMPLANT
CANISTER SUCT 3000ML PPV (MISCELLANEOUS) ×2 IMPLANT
CLIP LIGATING EXTRA MED SLVR (CLIP) ×2 IMPLANT
CLIP LIGATING EXTRA SM BLUE (MISCELLANEOUS) ×2 IMPLANT
CLIP TI LARGE 6 (CLIP) IMPLANT
CLIP VESOCCLUDE MED 6/CT (CLIP) ×2 IMPLANT
COVER BACK TABLE 60X90IN (DRAPES) ×2 IMPLANT
COVER SURGICAL LIGHT HANDLE (MISCELLANEOUS) ×4 IMPLANT
CUFF TOURN SGL QUICK 24 (TOURNIQUET CUFF) ×2
CUFF TRNQT CYL 24X4X16.5-23 (TOURNIQUET CUFF) IMPLANT
DRAIN CHANNEL 19F RND (DRAIN) IMPLANT
DRAPE HALF SHEET 40X57 (DRAPES) ×2 IMPLANT
DRAPE INCISE 23X17 IOBAN STRL (DRAPES) ×1
DRAPE INCISE 23X17 STRL (DRAPES) ×1 IMPLANT
DRAPE INCISE IOBAN 23X17 STRL (DRAPES) ×1 IMPLANT
DRAPE INCISE IOBAN 66X45 STRL (DRAPES) ×4 IMPLANT
DRAPE ORTHO SPLIT 77X108 STRL (DRAPES) ×4
DRAPE SURG ORHT 6 SPLT 77X108 (DRAPES) ×2 IMPLANT
DRAPE U-SHAPE 47X51 STRL (DRAPES) IMPLANT
DRSG ADAPTIC 3X8 NADH LF (GAUZE/BANDAGES/DRESSINGS) ×2 IMPLANT
ELECT CAUTERY BLADE 6.4 (BLADE) ×2 IMPLANT
ELECT REM PT RETURN 9FT ADLT (ELECTROSURGICAL) ×2
ELECTRODE REM PT RTRN 9FT ADLT (ELECTROSURGICAL) ×1 IMPLANT
EVACUATOR SILICONE 100CC (DRAIN) IMPLANT
GAUZE SPONGE 4X4 12PLY STRL (GAUZE/BANDAGES/DRESSINGS) ×3 IMPLANT
GAUZE XEROFORM 1X8 LF (GAUZE/BANDAGES/DRESSINGS) ×1 IMPLANT
GLOVE SRG 8 PF TXTR STRL LF DI (GLOVE) ×2 IMPLANT
GLOVE SURG POLYISO LF SZ8 (GLOVE) IMPLANT
GLOVE SURG UNDER POLY LF SZ8 (GLOVE) ×4
GOWN STRL REUS W/TWL 2XL LVL3 (GOWN DISPOSABLE) ×2 IMPLANT
HEMOSTAT SNOW SURGICEL 2X4 (HEMOSTASIS) ×1 IMPLANT
KIT BASIN OR (CUSTOM PROCEDURE TRAY) ×2 IMPLANT
KIT TURNOVER KIT B (KITS) ×2 IMPLANT
NS IRRIG 1000ML POUR BTL (IV SOLUTION) ×2 IMPLANT
PACK GENERAL/GYN (CUSTOM PROCEDURE TRAY) ×1 IMPLANT
PACK ORTHO EXTREMITY (CUSTOM PROCEDURE TRAY) ×1 IMPLANT
PAD ARMBOARD 7.5X6 YLW CONV (MISCELLANEOUS) ×4 IMPLANT
STAPLER VISISTAT 35W (STAPLE) ×2 IMPLANT
STOCKINETTE IMPERVIOUS LG (DRAPES) ×2 IMPLANT
SUT ETHILON 3 0 PS 1 (SUTURE) IMPLANT
SUT SILK 0 TIES 10X30 (SUTURE) ×2 IMPLANT
SUT SILK 2 0 (SUTURE) ×2
SUT SILK 2 0 SH CR/8 (SUTURE) ×2 IMPLANT
SUT SILK 2-0 18XBRD TIE 12 (SUTURE) ×1 IMPLANT
SUT VIC AB 2-0 CT1 18 (SUTURE) ×4 IMPLANT
SUT VIC AB 3-0 SH 18 (SUTURE) IMPLANT
TOWEL GREEN STERILE (TOWEL DISPOSABLE) ×4 IMPLANT
UNDERPAD 30X36 HEAVY ABSORB (UNDERPADS AND DIAPERS) ×2 IMPLANT
WATER STERILE IRR 1000ML POUR (IV SOLUTION) ×2 IMPLANT

## 2021-07-30 NOTE — ED Notes (Signed)
Pt's Pulse ox. Pulse rate stated 36, Rn palpated pulse to be 40 Bpm and aapplied cardiac monitoring. ECG HR showed 70. BO maintaining at 127/68. Pt seemed to be lethargic and difficult to arouse, EDP asked to come assess while admitting was being notified. Repeat EKG was captured.

## 2021-07-30 NOTE — Consult Note (Signed)
[facility]  Patient seen and examined in preop holding.  No complaints. No changes to medication history or physical exam since last seen. After discussing the risks and benefits of right above knee amputation, Rykker L Limb and his granddaughter [name] elected to proceed.   Broadus John MD   Patient name: Todd Dunn MRN: [id number] DOB: [birth year] Sex: male   REQUESTING PROVIDER:    ER   REASON FOR CONSULT:    Rest pain right leg  HISTORY OF PRESENT ILLNESS:   Todd Dunn is a 81 y.o. male, who is brought to the emergency department by his granddaughter for continued wrist pain in his right leg.  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 Carlis Abbott on 03/26/2021.  He has subsequently undergone a great toe amputation by Dr. Sharol Given on 04/01/2021.  He was seen in the office 2 days ago.  His bypass graft is known to be occluded.  He is not a candidate for further procedures.  He was discharged from the office with pain medicine and told to contact us when he was ready for amputation.  The granddaughter states that the patient is ready for amputation and that he is having constant pain since he slammed his foot in the door and has a new ulcer.  The patient takes a statin for hypercholesterolemia.  He is on aspirin.  He has a history of coronary artery disease.  He is medically managed for hypertension.  He has a history of stroke.  PAST MEDICAL HISTORY    Past Medical History:  Diagnosis Date   BPH (benign prostatic hyperplasia)    CAD (coronary artery disease)    Chronic indwelling Foley catheter    CVA (cerebral vascular accident) ([place])    GERD (gastroesophageal reflux disease)    HTN (hypertension)    Neurogenic bladder    SSS (sick sinus syndrome) ([place])    s/p MDT ICD   Vitamin D deficiency      FAMILY HISTORY   Family  History  Family history unknown: Yes    SOCIAL HISTORY:   Social History   Socioeconomic History   Marital status: Widowed    Spouse name: Not on file   Number of children: Not on file   Years of education: Not on file   Highest education level: Not on file  Occupational History   Not on file  Tobacco Use   Smoking status: Former    Types: Cigarettes    Passive exposure: Never   Smokeless tobacco: Never  Substance and Sexual Activity   Alcohol use: Not Currently   Drug use: Never   Sexual activity: Not on file  Other Topics Concern   Not on file  Social History Narrative   ** Merged History Encounter **       Social Determinants of Health   Financial Resource Strain: Not on file  Food Insecurity: Not on file  Transportation Needs: Not on file  Physical Activity: Not on file  Stress: Not on file  Social Connections: Not on file  Intimate Partner Violence: Not on file    ALLERGIES:    Allergies  Allergen Reactions   Food Anaphylaxis and Rash    Clams   Aspirin Itching   [place] Clam (M. Mercenaria) Skin Test Itching    CURRENT MEDICATIONS:    Current Facility-Administered Medications  Medication Dose Route  Frequency Provider Last Rate Last Admin   0.9 % NaCl with KCl 20 mEq/ L  infusion   Intravenous Continuous Mansy, Jan A, MD 100 mL/hr at 07/30/21 0950 New Bag at 07/30/21 0950   [MAR Hold] acetaminophen (TYLENOL) tablet 650 mg  650 mg Oral Q6H PRN Mansy, Arvella Merles, MD       Or   [facility] Hold] acetaminophen (TYLENOL) suppository 650 mg  650 mg Rectal Q6H PRN Mansy, Arvella Merles, MD       [MAR Hold] albuterol (PROVENTIL) (2.5 MG/3ML) 0.083% nebulizer solution 3 mL  3 mL Inhalation Q6H PRN Mansy, Arvella Merles, MD       [MAR Hold] amLODipine (NORVASC) tablet 10 mg  10 mg Oral Daily Mansy, Jan A, MD   10 mg at 07/30/21 0936   [MAR Hold] cephALEXin (KEFLEX) capsule 500 mg  500 mg Oral TID Mansy, Jan A, MD   500 mg at 07/30/21 0932   [MAR Hold] divalproex (DEPAKOTE) DR  tablet 125 mg  125 mg Oral QHS Mansy, Jan A, MD   125 mg at 07/30/21 0147   [MAR Hold] enoxaparin (LOVENOX) injection 40 mg  40 mg Subcutaneous Q24H Mansy, Jan A, MD       [MAR Hold] finasteride (PROSCAR) tablet 5 mg  5 mg Oral q morning Mansy, Jan A, MD   5 mg at 07/30/21 0951   [facility] Hold] HYDROcodone-acetaminophen (NORCO/VICODIN) 5-325 MG per tablet 1 tablet  1 tablet Oral Q6H PRN Mansy, Jan A, MD   1 tablet at 07/30/21 0932   lactated ringers infusion   Intravenous Continuous Belinda Block, MD 10 mL/hr at 07/30/21 1424 New Bag at 07/30/21 1424   [MAR Hold] magnesium hydroxide (MILK OF MAGNESIA) suspension 30 mL  30 mL Oral Daily PRN Mansy, Arvella Merles, MD       [facility] Hold] morphine (PF) 2 MG/ML injection 2 mg  2 mg Intravenous Q4H PRN Mansy, Jan A, MD   2 mg at 07/30/21 0544   [MAR Hold] nitroGLYCERIN (NITROSTAT) SL tablet 0.4 mg  0.4 mg Sublingual Q5 Min x 3 PRN Mansy, Jan A, MD       [MAR Hold] ondansetron [facility]) tablet 4 mg  4 mg Oral Q6H PRN Mansy, Arvella Merles, MD       Or   [facility] Hold] ondansetron [facility]) injection 4 mg  4 mg Intravenous Q6H PRN Mansy, Arvella Merles, MD       [MAR Hold] pantoprazole (PROTONIX) EC tablet 40 mg  40 mg Oral Daily Mansy, Jan A, MD   40 mg at 07/30/21 0936   [MAR Hold] polyethylene glycol (MIRALAX / GLYCOLAX) packet 17 g  17 g Oral Daily PRN Mansy, Jan A, MD       [MAR Hold] rosuvastatin (CRESTOR) tablet 10 mg  10 mg Oral q morning Mansy, Jan A, MD   10 mg at 07/30/21 0936   [MAR Hold] traZODone (DESYREL) tablet 25 mg  25 mg Oral QHS PRN Mansy, Jan A, MD       [MAR Hold] vitamin B-12 (CYANOCOBALAMIN) tablet 1,000 mcg  1,000 mcg Oral Daily Mansy, Jan A, MD   1,000 mcg at 07/30/21 6712   [facility] Hold] Vitamin D (Ergocalciferol) (DRISDOL) capsule 50,000 Units  50,000 Units Oral Q Mon Mansy, Jan A, MD        REVIEW OF SYSTEMS:   [X]  denotes positive finding, [ ]  denotes negative finding Cardiac  Comments:  Chest pain or chest pressure:    Shortness  of breath upon exertion:     Short of breath when lying flat:    Irregular heart rhythm:        Vascular    Pain in calf, thigh, or hip brought on by ambulation:    Pain in feet at night that wakes you up from your sleep:     Blood clot in your veins:    Leg swelling:         Pulmonary    Oxygen at home:    Productive cough:     Wheezing:         Neurologic    Sudden weakness in arms or legs:     Sudden numbness in arms or legs:     Sudden onset of difficulty speaking or slurred speech:    Temporary loss of vision in one eye:     Problems with dizziness:         Gastrointestinal    Blood in stool:      Vomited blood:         Genitourinary    Burning when urinating:     Blood in urine:        Psychiatric    Major depression:         Hematologic    Bleeding problems:    Problems with blood clotting too easily:        Skin    Rashes or ulcers:        Constitutional    Fever or chills:     PHYSICAL EXAM:   Vitals:   07/30/21 1000 07/30/21 1300 07/30/21 1319 07/30/21 1413  BP: 136/70 111/65 111/65 128/83  Pulse:  (!) 36 (!) 38   Resp:  11 16 17   Temp:      SpO2:  99% 97%   Weight:      Height:        GENERAL: The patient is a well-nourished male, in no acute distress. The vital signs are documented above. CARDIAC: There is a regular rate and rhythm.  VASCULAR: Nonpalpable pedal pulses PULMONARY: Nonlabored respirations ABDOMEN: Soft and non-tender with normal pitched bowel sounds.  MUSCULOSKELETAL: There are no major deformities or cyanosis. NEUROLOGIC: No focal weakness or paresthesias are detected. SKIN: There are no ulcers or rashes noted. PSYCHIATRIC: The patient has a normal affect.   STUDIES:   None  ASSESSMENT and PLAN   Right foot ulcer with rest pain: I discussed with the patient and his granddaughter via telephone that he does not have options for further revascularization.  Because of his nonhealing wound and significant pain, his only option is an above-knee  amputation.  The granddaughter who I spoke with said that she is in full agreement, as did the patient.  In fact the granddaughter says she has a recording of her grandfather stating that he cannot take the pain and is ready for amputation.  I discussed that we would move forward with the operation this afternoon.  All questions were answered.   Leia Alf, MD, FACS [facility] [phone] Pager [phone]

## 2021-07-30 NOTE — Transfer of Care (Signed)
Immediate Anesthesia Transfer of Care Note  Patient: Todd Dunn  Procedure(s) Performed: AMPUTATION ABOVE KNEE (Right: Knee)  Patient Location: PACU  Anesthesia Type:General  Level of Consciousness: drowsy  Airway & Oxygen Therapy: Patient Spontanous Breathing and Patient connected to face mask oxygen  Post-op Assessment: Report given to RN and Post -op Vital signs reviewed and stable  Post vital signs: Reviewed and stable  Last Vitals:  Vitals Value Taken Time  BP 140/78 07/30/21 1644  Temp    Pulse 73 07/30/21 1645  Resp 19 07/30/21 1645  SpO2 100 07/30/21 1645  Vitals shown include unvalidated device data.  Last Pain:  Vitals:   07/30/21 1131  PainSc: Asleep         Complications: No notable events documented.

## 2021-07-30 NOTE — Consult Note (Signed)
[facility]  Patient name: Todd Dunn MRN: [id number] DOB: [birth year] Sex: male   REQUESTING PROVIDER:    ER   REASON FOR CONSULT:    Rest pain right leg  HISTORY OF PRESENT ILLNESS:   Todd Dunn is a 81 y.o. male, who is brought to the emergency department by his granddaughter for continued wrist pain in his right leg.  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 Carlis Abbott on 03/26/2021.  He has subsequently undergone a great toe amputation by Dr. Sharol Given on 04/01/2021.  He was seen in the office 2 days ago.  His bypass graft is known to be occluded.  He is not a candidate for further procedures.  He was discharged from the office with pain medicine and told to contact us when he was ready for amputation.  The granddaughter states that the patient is ready for amputation and that he is having constant pain since he slammed his foot in the door and has a new ulcer.  The patient takes a statin for hypercholesterolemia.  He is on aspirin.  He has a history of coronary artery disease.  He is medically managed for hypertension.  He has a history of stroke.  PAST MEDICAL HISTORY    Past Medical History:  Diagnosis Date   BPH (benign prostatic hyperplasia)    CAD (coronary artery disease)    Chronic indwelling Foley catheter    CVA (cerebral vascular accident) ([place])    GERD (gastroesophageal reflux disease)    HTN (hypertension)    Neurogenic bladder    SSS (sick sinus syndrome) ([place])    s/p MDT ICD   Vitamin D deficiency      FAMILY HISTORY   Family History  Family history unknown: Yes    SOCIAL HISTORY:   Social History   Socioeconomic History   Marital status: Widowed    Spouse name: Not on file   Number of children: Not on file   Years of education: Not on file   Highest education level: Not on file  Occupational History   Not on  file  Tobacco Use   Smoking status: Former    Types: Cigarettes    Passive exposure: Never   Smokeless tobacco: Never  Substance and Sexual Activity   Alcohol use: Not Currently   Drug use: Never   Sexual activity: Not on file  Other Topics Concern   Not on file  Social History Narrative   ** Merged History Encounter **       Social Determinants of Health   Financial Resource Strain: Not on file  Food Insecurity: Not on file  Transportation Needs: Not on file  Physical Activity: Not on file  Stress: Not on file  Social Connections: Not on file  Intimate Partner Violence: Not on file    ALLERGIES:    Allergies  Allergen Reactions   Food Anaphylaxis and Rash    Clams   Aspirin Itching   [place] Clam ([place]) Skin Test Itching    CURRENT MEDICATIONS:    Current Facility-Administered Medications  Medication Dose Route Frequency Provider Last Rate Last Admin   0.9 % NaCl with KCl 20 mEq/ L  infusion   Intravenous Continuous Mansy, Jan A, MD 100 mL/hr at 07/30/21 0146 New Bag at 07/30/21 0146   acetaminophen (TYLENOL) tablet 650 mg  650 mg Oral Q6H PRN Mansy, Jan  A, MD       Or   acetaminophen (TYLENOL) suppository 650 mg  650 mg Rectal Q6H PRN Mansy, Jan A, MD       albuterol (PROVENTIL) (2.5 MG/3ML) 0.083% nebulizer solution 3 mL  3 mL Inhalation Q6H PRN Mansy, Jan A, MD       amLODipine (NORVASC) tablet 10 mg  10 mg Oral Daily Mansy, Jan A, MD       cephALEXin (KEFLEX) capsule 500 mg  500 mg Oral TID Mansy, Jan A, MD       divalproex (DEPAKOTE) DR tablet 125 mg  125 mg Oral QHS Mansy, Jan A, MD   125 mg at 07/30/21 0147   enoxaparin (LOVENOX) injection 40 mg  40 mg Subcutaneous Q24H Mansy, Jan A, MD       finasteride (PROSCAR) tablet 5 mg  5 mg Oral q morning Mansy, Jan A, MD       HYDROcodone-acetaminophen (NORCO/VICODIN) 5-325 MG per tablet 1 tablet  1 tablet Oral Q6H PRN Mansy, Jan A, MD   1 tablet at 07/30/21 0153   magnesium hydroxide (MILK OF  MAGNESIA) suspension 30 mL  30 mL Oral Daily PRN Mansy, Jan A, MD       morphine (PF) 2 MG/ML injection 2 mg  2 mg Intravenous Q4H PRN Mansy, Jan A, MD   2 mg at 07/30/21 0544   nitroGLYCERIN (NITROSTAT) SL tablet 0.4 mg  0.4 mg Sublingual Q5 Min x 3 PRN Mansy, Jan A, MD       ondansetron [facility]) tablet 4 mg  4 mg Oral Q6H PRN Mansy, Jan A, MD       Or   ondansetron [facility]) injection 4 mg  4 mg Intravenous Q6H PRN Mansy, Jan A, MD       pantoprazole (PROTONIX) EC tablet 40 mg  40 mg Oral Daily Mansy, Jan A, MD       polyethylene glycol (MIRALAX / GLYCOLAX) packet 17 g  17 g Oral Daily PRN Mansy, Jan A, MD       rosuvastatin (CRESTOR) tablet 10 mg  10 mg Oral q morning Mansy, Jan A, MD       traZODone (DESYREL) tablet 25 mg  25 mg Oral QHS PRN Mansy, Jan A, MD       vitamin B-12 (CYANOCOBALAMIN) tablet 1,000 mcg  1,000 mcg Oral Daily Mansy, Jan A, MD       [START ON 08/03/2021] Vitamin D (Ergocalciferol) (DRISDOL) capsule 50,000 Units  50,000 Units Oral Q Mon Mansy, Jan A, MD       Current Outpatient Medications  Medication Sig Dispense Refill   acetaminophen (TYLENOL) 325 MG tablet Take 650 mg by mouth every 4 (four) hours as needed for mild pain.     albuterol (VENTOLIN HFA) 108 (90 Base) MCG/ACT inhaler Inhale 2 puffs into the lungs every 6 (six) hours as needed for wheezing or shortness of breath. 6.7 g 0   amLODipine (NORVASC) 10 MG tablet Take 1 tablet (10 mg total) by mouth daily. 90 tablet 1   cephALEXin (KEFLEX) 500 MG capsule Take 1 capsule (500 mg total) by mouth 3 (three) times daily. 30 capsule 0   clopidogrel (PLAVIX) 75 MG tablet Take 1 tablet (75 mg total) by mouth at bedtime. 90 tablet 3   divalproex (DEPAKOTE) 125 MG DR tablet Take 1 tablet (125 mg total) by mouth at bedtime. 90 tablet 1   finasteride (PROSCAR) 5 MG tablet Take 1 tablet (5 mg  total) by mouth every morning. 30 tablet 2   HYDROcodone-acetaminophen (NORCO) 5-325 MG tablet Take 1 tablet by mouth every 6 (six)  hours as needed for moderate pain. 20 tablet 0   nitrofurantoin, macrocrystal-monohydrate, (MACROBID) 100 MG capsule Take 100 mg by mouth 2 (two) times daily.     nitroGLYCERIN (NITROSTAT) 0.4 MG SL tablet Place 0.4 mg under the tongue every 5 (five) minutes x 3 doses as needed for chest pain.     pantoprazole (PROTONIX) 40 MG tablet TAKE 1 TABLET(40 MG) BY MOUTH DAILY 90 tablet 0   polyethylene glycol (MIRALAX / GLYCOLAX) 17 g packet Take 17 g by mouth daily as needed for mild constipation. 14 each 0   rosuvastatin (CRESTOR) 10 MG tablet Take 1 tablet (10 mg total) by mouth every morning. 90 tablet 3   tetracycline (SUMYCIN) 500 MG capsule Take 500 mg by mouth every 12 (twelve) hours.     vitamin B-12 (CYANOCOBALAMIN) 1000 MCG tablet Take 1 tablet (1,000 mcg total) by mouth daily. 30 tablet 3   Vitamin D, Ergocalciferol, (DRISDOL) 1.25 MG (50000 UNIT) CAPS capsule Take 50,000 Units by mouth every Monday.      REVIEW OF SYSTEMS:   [X]  denotes positive finding, [ ]  denotes negative finding Cardiac  Comments:  Chest pain or chest pressure:    Shortness of breath upon exertion:    Short of breath when lying flat:    Irregular heart rhythm:        Vascular    Pain in calf, thigh, or hip brought on by ambulation:    Pain in feet at night that wakes you up from your sleep:     Blood clot in your veins:    Leg swelling:         Pulmonary    Oxygen at home:    Productive cough:     Wheezing:         Neurologic    Sudden weakness in arms or legs:     Sudden numbness in arms or legs:     Sudden onset of difficulty speaking or slurred speech:    Temporary loss of vision in one eye:     Problems with dizziness:         Gastrointestinal    Blood in stool:      Vomited blood:         Genitourinary    Burning when urinating:     Blood in urine:        Psychiatric    Major depression:         Hematologic    Bleeding problems:    Problems with blood clotting too easily:         Skin    Rashes or ulcers:        Constitutional    Fever or chills:     PHYSICAL EXAM:   Vitals:   07/30/21 0315 07/30/21 0345 07/30/21 0400 07/30/21 0630  BP: 127/68 130/72 111/63 116/71  Pulse: (!) 36 (!) 52 (!) 34 (!) 37  Resp: (!) 23 17 16 15   Temp:      SpO2: 100% 100% 100% 100%  Weight:      Height:        GENERAL: The patient is a well-nourished male, in no acute distress. The vital signs are documented above. CARDIAC: There is a regular rate and rhythm.  VASCULAR: Nonpalpable pedal pulses PULMONARY: Nonlabored respirations ABDOMEN: Soft and non-tender with normal pitched bowel  sounds.  MUSCULOSKELETAL: There are no major deformities or cyanosis. NEUROLOGIC: No focal weakness or paresthesias are detected. SKIN: There are no ulcers or rashes noted. PSYCHIATRIC: The patient has a normal affect.   STUDIES:   None  ASSESSMENT and PLAN   Right foot ulcer with rest pain: I discussed with the patient and his granddaughter via telephone that he does not have options for further revascularization.  Because of his nonhealing wound and significant pain, his only option is an above-knee amputation.  The granddaughter who I spoke with said that she is in full agreement, as did the patient.  In fact the granddaughter says she has a recording of her grandfather stating that he cannot take the pain and is ready for amputation.  I discussed that we would move forward with the operation this afternoon.  All questions were answered.   Leia Alf, MD, FACS [facility] [phone] Pager [phone]

## 2021-07-30 NOTE — H&P (Addendum)
[place]   PATIENT NAME: Geneva Pallas    MR#:  [id number]  DATE OF BIRTH:  1940-12-28  DATE OF ADMISSION:  07/29/2021  PRIMARY CARE PHYSICIAN: [name], NP   Patient is coming from: Home  REQUESTING/REFERRING PHYSICIAN: Deno Etienne, DO   CHIEF COMPLAINT:   Chief Complaint  Patient presents with   Leg Pain  Right foot pain  HISTORY OF PRESENT ILLNESS:  LASHON HILLIER is a 81 y.o. male with medical history significant for coronary artery disease, CVA, BPH, GERD, hypertension, SSS, s/p PE AICD neurogenic bladder, who presented to the ER with acute onset of right second toe worsening pain with associated right foot pain.  The patient had a right leg bypass surgery in October and right great toe injury at that time when he had ray resection.  He hit his right second toe on the shower door and it is now with black discoloration. He was just seen by vascular surgery yesterday, thought to have an occlusion at the level of his knee and failed bypass.  He was apparently advised that he may need right AKA.  He denies any fever or chills.  No nausea or vomiting or abdominal pain.  No chest pain or palpitations.  No cough or wheezing or hemoptysis.  No other bleeding diathesis.  He was placed on hydrocodone without not to be acting like himself.  ED Course: When he came to the ER, his BP was 125/106 with a heart rate of 52 otherwise normal vital signs.  Labs revealed an albumin of 2.9 and total protein of 6.1 with alk phos of 139.  CBC showed anemia better than previous levels.  Influenza antigens and COVID-19 PCR came back negative.  He had a recent UTI on 2/16 for which she was given p.o. Keflex.  Imaging: Right foot x-ray showed prior resection of the first ray with no acute fracture or subluxation.  Dr. Trula Slade was notified about the patient and is aware.  He will be admitted to a med-surgical bed for further evaluation and management. PAST MEDICAL HISTORY:   Past  Medical History:  Diagnosis Date   BPH (benign prostatic hyperplasia)    CAD (coronary artery disease)    Chronic indwelling Foley catheter    CVA (cerebral vascular accident) ([place])    GERD (gastroesophageal reflux disease)    HTN (hypertension)    Neurogenic bladder    SSS (sick sinus syndrome) ([place])    s/p MDT ICD   Vitamin D deficiency     PAST SURGICAL HISTORY:   Past Surgical History:  Procedure Laterality Date   ABDOMINAL AORTOGRAM W/LOWER EXTREMITY N/A 03/23/2021   Procedure: ABDOMINAL AORTOGRAM W/LOWER EXTREMITY;  Surgeon: Waynetta Sandy, MD;  Location: [facility];  Service: Cardiovascular;  Laterality: N/A;   AMPUTATION Right 04/01/2021   Procedure: RIGHT GREAT TOE AMPUTATION;  Surgeon: Newt Minion, MD;  Location: [facility];  Service: Orthopedics;  Laterality: Right;   APPENDECTOMY     APPLICATION OF WOUND VAC Right 04/01/2021   Procedure: APPLICATION OF WOUND VAC;  Surgeon: Newt Minion, MD;  Location: [place];  Service: Orthopedics;  Laterality: Right;   CARDIAC SURGERY     FEMORAL-TIBIAL BYPASS GRAFT Right 03/26/2021   Procedure: BYPASS GRAFT FEMORAL-TIBIAL ARTERY.  Harvest right leg saphenous vein, right vein patch angioplasty posterior tibial.;  Surgeon: Marty Heck, MD;  Location: [facility];  Service: Vascular;  Laterality: Right;   PACEMAKER PLACEMENT  SOCIAL HISTORY:   Social History   Tobacco Use   Smoking status: Former    Types: Cigarettes    Passive exposure: Never   Smokeless tobacco: Never  Substance Use Topics   Alcohol use: Not Currently    FAMILY HISTORY:   Family History  Family history unknown: Yes    DRUG ALLERGIES:   Allergies  Allergen Reactions   Food Anaphylaxis and Rash    Clams   Aspirin Itching   [place] Clam (M. Mercenaria) Skin Test Itching    REVIEW OF SYSTEMS:   ROS As per history of present illness. All pertinent systems were reviewed above. Constitutional,  HEENT, cardiovascular, respiratory, GI, GU, musculoskeletal, neuro, psychiatric, endocrine, integumentary and hematologic systems were reviewed and are otherwise negative/unremarkable except for positive findings mentioned above in the HPI.   MEDICATIONS AT HOME:   Prior to Admission medications   Medication Sig Start Date End Date Taking? Authorizing Provider  acetaminophen (TYLENOL) 325 MG tablet Take 650 mg by mouth every 4 (four) hours as needed for mild pain.    [provider]  albuterol (VENTOLIN HFA) 108 (90 Base) MCG/ACT inhaler Inhale 2 puffs into the lungs every 6 (six) hours as needed for wheezing or shortness of breath. 06/24/21   Terrilee Croak, MD  amLODipine (NORVASC) 10 MG tablet Take 1 tablet (10 mg total) by mouth daily. 07/17/21   [name], NP  cephALEXin (KEFLEX) 500 MG capsule Take 1 capsule (500 mg total) by mouth 3 (three) times daily. 6/57/84   Delora Fuel, MD  clopidogrel (PLAVIX) 75 MG tablet Take 1 tablet (75 mg total) by mouth at bedtime. 07/17/21   [name], NP  divalproex (DEPAKOTE) 125 MG DR tablet Take 1 tablet (125 mg total) by mouth at bedtime. 07/17/21 10/15/21  [name], NP  finasteride (PROSCAR) 5 MG tablet Take 1 tablet (5 mg total) by mouth every morning. 06/24/21 09/22/21  Terrilee Croak, MD  HYDROcodone-acetaminophen (NORCO) 5-325 MG tablet Take 1 tablet by mouth every 6 (six) hours as needed for moderate pain. 07/28/21   Rhyne, Hulen Shouts, PA-C  nitrofurantoin, macrocrystal-monohydrate, (MACROBID) 100 MG capsule Take 100 mg by mouth 2 (two) times daily. 07/16/21   [provider]  nitroGLYCERIN (NITROSTAT) 0.4 MG SL tablet Place 0.4 mg under the tongue every 5 (five) minutes x 3 doses as needed for chest pain.    [provider]  pantoprazole (PROTONIX) 40 MG tablet TAKE 1 TABLET(40 MG) BY MOUTH DAILY 07/21/21   Charlott Rakes, MD  polyethylene glycol (MIRALAX / GLYCOLAX) 17 g packet Take 17 g by mouth daily as  needed for mild constipation. 07/17/21   [name], NP  rosuvastatin (CRESTOR) 10 MG tablet Take 1 tablet (10 mg total) by mouth every morning. 07/17/21   [name], NP  tetracycline (SUMYCIN) 500 MG capsule Take 500 mg by mouth every 12 (twelve) hours. 07/21/21   [provider]  vitamin B-12 (CYANOCOBALAMIN) 1000 MCG tablet Take 1 tablet (1,000 mcg total) by mouth daily. 07/17/21   [name], NP  Vitamin D, Ergocalciferol, (DRISDOL) 1.25 MG (50000 UNIT) CAPS capsule Take 50,000 Units by mouth every Monday.    [provider]      VITAL SIGNS:  Blood pressure 126/76, pulse 86, temperature 97.7 F (36.5 C), resp. rate 20, height 6' (1.829 m), weight 59 kg, SpO2 100 %.  PHYSICAL EXAMINATION:  Physical Exam  GENERAL:  81 y.o.-year-old patient lying in  the bed with no acute distress.  EYES: Pupils equal, round, reactive to light and accommodation. No scleral icterus. Extraocular muscles intact.  HEENT: Head atraumatic, normocephalic. Oropharynx and nasopharynx clear.  NECK:  Supple, no jugular venous distention. No thyroid enlargement, no tenderness.  LUNGS: Normal breath sounds bilaterally, no wheezing, rales,rhonchi or crepitation. No use of accessory muscles of respiration.  CARDIOVASCULAR: Regular rate and rhythm, S1, S2 normal. No murmurs, rubs, or gallops.  ABDOMEN: Soft, nondistended, nontender. Bowel sounds present. No organomegaly or mass.  EXTREMITIES: No pedal edema, cyanosis, or clubbing.  NEUROLOGIC: Cranial nerves II through XII are intact. Muscle strength 5/5 in all extremities. Sensation intact. Gait not checked.  PSYCHIATRIC: The patient is alert and oriented x 3.  Normal affect and good eye contact. SKIN/vascular: Right ray resection and black discoloration of the second toe with associated tenderness all over the foot and right second toe.  I cannot palpate a DP or PT pulses.    LABORATORY PANEL:   CBC Recent Labs  Lab 07/29/21 2104   WBC 5.8  HGB 11.4*  HCT 39.5  PLT 189   ------------------------------------------------------------------------------------------------------------------  Chemistries  Recent Labs  Lab 07/29/21 2325  NA 140  K 3.6  CL 103  CO2 31  GLUCOSE 129*  BUN 19  CREATININE 1.02  CALCIUM 9.7  AST 14*  ALT 12  ALKPHOS 139*  BILITOT 0.8   ------------------------------------------------------------------------------------------------------------------  Cardiac Enzymes No results for input(s): TROPONINI in the last 168 hours. ------------------------------------------------------------------------------------------------------------------  RADIOLOGY:  DG Foot Complete Right  Result Date: 07/29/2021 CLINICAL DATA:  Foot injury. Leg pain. Struck second toe in the shower, black in color. EXAM: RIGHT FOOT COMPLETE - 3+ VIEW COMPARISON:  None. FINDINGS: Prior resection of the first ray. Hammertoe deformity of the digits limits assessment. There is no evidence of acute fracture. Mild degenerative change of the midfoot. No erosion, bony destruction, or periosteal reaction. No soft tissue gas. No radiopaque foreign body IMPRESSION: Prior resection of the first ray. No acute fracture or subluxation. Electronically Signed   By: Keith Rake M.D.   On: 07/29/2021 22:05   VAS [place] ABI WITH/WO TBI  Result Date: 07/28/2021  LOWER EXTREMITY DOPPLER STUDY Patient Name:  SECUNDINO ELLITHORPE  Date of Exam:   07/28/2021 Medical Rec #: [id number]           Accession #:    [id number] Date of Birth: 1940/08/22           Patient Gender: M Patient Age:   29 years Exam Location:  [facility] Procedure:      VAS [place] ABI WITH/WO TBI Referring Phys: Risa Grill --------------------------------------------------------------------------------  Indications: Peripheral artery disease. High Risk         Hypertension, hyperlipidemia, past history of smoking, prior Factors:          CVA.  Vascular  Interventions: Right great toe amputation 04/01/2021. Right common                         femoral to posterior tibial artery bypass graft                         03/26/2021. Performing Technologist: Delorise Shiner RVT  Examination Guidelines: A complete evaluation includes at minimum, Doppler waveform signals and systolic blood pressure reading at the level of bilateral brachial, anterior tibial, and posterior tibial arteries, when vessel segments are accessible. Bilateral testing is considered an  integral part of a complete examination. Photoelectric Plethysmograph (PPG) waveforms and toe systolic pressure readings are included as required and additional duplex testing as needed. Limited examinations for reoccurring indications may be performed as noted.  ABI Findings: +---------+------------------+-----+-------------------+----------+  Right     Rt Pressure (mmHg) Index Waveform            Comment     +---------+------------------+-----+-------------------+----------+  Brachial  134                                                      +---------+------------------+-----+-------------------+----------+  PTA       30                 0.22  dampened monophasic             +---------+------------------+-----+-------------------+----------+  DP        0                  0.00  absent                          +---------+------------------+-----+-------------------+----------+  Great Toe                                              amputation  +---------+------------------+-----+-------------------+----------+ +---------+------------------+-----+-------------------+-------+  Left      Lt Pressure (mmHg) Index Waveform            Comment  +---------+------------------+-----+-------------------+-------+  Brachial  134                                                   +---------+------------------+-----+-------------------+-------+  PTA       54                 0.40  dampened monophasic           +---------+------------------+-----+-------------------+-------+  DP        36                 0.27  dampened monophasic          +---------+------------------+-----+-------------------+-------+  Great Toe 42                 0.31                               +---------+------------------+-----+-------------------+-------+ +-------+-----------+-----------+------------+------------+  ABI/TBI Today's ABI Today's TBI Previous ABI Previous TBI  +-------+-----------+-----------+------------+------------+  Right   0.22        amputation  1.09         amputation    +-------+-----------+-----------+------------+------------+  Left    0.40        0.31        0.74         0.59          +-------+-----------+-----------+------------+------------+ Bilateral ABIs appear decreased compared to prior study on 04/28/2021.  Summary: Right: Resting right ankle-brachial index indicates severe right lower extremity arterial disease. Left: Resting left ankle-brachial index indicates  severe left lower extremity arterial disease. The left toe-brachial index is abnormal.  *See table(s) above for measurements and observations.  Electronically signed by Monica Martinez MD on 07/28/2021 at 11:53:59 AM.    Final    VAS [place] LOWER EXTREMITY BYPASS GRAFT DUPL  Result Date: 07/28/2021 LOWER EXTREMITY ARTERIAL DUPLEX STUDY Patient Name:  NICHOLOUS GIRGENTI  Date of Exam:   07/28/2021 Medical Rec #: [id number]           Accession #:    [id number] Date of Birth: December 27, 1940           Patient Gender: M Patient Age:   71 years Exam Location:  [facility] Procedure:      VAS [place] LOWER EXTREMITY BYPASS GRAFT DUPLEX Referring Phys: Risa Grill --------------------------------------------------------------------------------  Indications: Peripheral artery disease. High Risk         Hypertension, hyperlipidemia, past history of smoking, prior Factors:          CVA.  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 Right common                          femoral to posterior tibial artery bypass graft                         03/26/2021. Current ABI:            Right: 0.22, Left: 0.40 Performing Technologist: Delorise Shiner RVT  Examination Guidelines: A complete evaluation includes B-mode imaging, spectral Doppler, color Doppler, and power Doppler as needed of all accessible portions of each vessel. Bilateral testing is considered an integral part of a complete examination. Limited examinations for reoccurring indications may be performed as noted.  +----------+--------+-----+--------+--------+--------+  RIGHT      PSV cm/s Ratio Stenosis Waveform Comments  +----------+--------+-----+--------+--------+--------+  CFA Distal 64                      biphasic           +----------+--------+-----+--------+--------+--------+  Right Graft #1: Right fem-posterior tibial +------------------+--------+--------+--------+--------+                     PSV cm/s Stenosis Waveform Comments  +------------------+--------+--------+--------+--------+  Inflow             47                biphasic           +------------------+--------+--------+--------+--------+  Prox Anastomosis   73                biphasic           +------------------+--------+--------+--------+--------+  Proximal Graft     0                                    +------------------+--------+--------+--------+--------+  Mid Graft                                               +------------------+--------+--------+--------+--------+  Distal Graft       0                                    +------------------+--------+--------+--------+--------+  Distal Anastomosis 0                                    +------------------+--------+--------+--------+--------+  Outflow            0                                    +------------------+--------+--------+--------+--------+ Graft appears to be occluded/ thrombosed.   Summary: Right: Occluded right fem to posterior tibial artery bypass graft.  See  table(s) above for measurements and observations. Electronically signed by Monica Martinez MD on 07/28/2021 at 11:54:16 AM.    Final       IMPRESSION AND PLAN:  Principal Problem:   Ischemic toe  1.  Ischemic right foot and second toe. - The patient will be admitted to a med-surgical bed. - Pain management will be provided. - We will hold off Plavix for potential surgical intervention. - Vascular surgery consult will be obtained. - Dr. Trula Slade was notified about the patient and is aware.  The patient follows with Dr. Fortunato Curling. - He may need right above-knee amputation due to failed bypass and no options for thrombectomy Dr. Carlis Abbott his vascular surgeon. 2.  Essential hypertension. - We will continue Norvasc.  3.  Recent UTI and associated altered mental status exacerbated by narcotics. - We will continue p.o. Keflex. - We will monitor his mental status.  4.  BPH. - We will continue Proscar.  5.  Dyslipidemia. - We will continue statin therapy.  6.  GERD. - Continue PPI therapy.  DVT prophylaxis: Lovenox to be started postoperatively this afternoon.   Advanced Care Planning:  Code Status: full code. Family Communication:  The plan of care was discussed in details with the patient (and family). I answered all questions. The patient agreed to proceed with the above mentioned plan. Further management will depend upon hospital course. Disposition Plan: Back to previous home environment Consults called: Vascular surgery.  All the records are reviewed and case discussed with ED provider.  Status is: Inpatient  At the time of the admission, it appears that the appropriate admission status for this patient is inpatient.  This is judged to be reasonable and necessary in order to provide the required intensity of service to ensure the patient's safety given the presenting symptoms, physical exam findings and initial radiographic and laboratory data in the context of comorbid  conditions.  The patient requires inpatient status due to high intensity of service, high risk of further deterioration and high frequency of surveillance required.  I certify that at the time of admission, it is my clinical judgment that the patient will require inpatient hospital care extending more than 2 midnights.                            Dispo: The patient is from: Home              Anticipated d/c is to: Home              Patient currently is not medically stable to d/c.              Difficult to place patient: No  Christel Mormon M.D on 07/30/2021 at 12:47 AM  [facility]   From 7 PM-7 AM, contact night-coverage [email or web address]  CC: Primary care physician; [name], NP

## 2021-07-30 NOTE — Anesthesia Preprocedure Evaluation (Addendum)
Anesthesia Evaluation  Patient identified by MRN, date of birth, ID band Patient awake    Reviewed: Allergy & Precautions, NPO status , Patient's Chart, lab work & pertinent test results  Airway Mallampati: II  TM Distance: >3 FB     Dental   Pulmonary pneumonia, former smoker,    breath sounds clear to auscultation       Cardiovascular hypertension, + CAD and + Peripheral Vascular Disease  + pacemaker  Rhythm:Regular Rate:Normal     Neuro/Psych CVA    GI/Hepatic Neg liver ROS, GERD  ,  Endo/Other  negative endocrine ROS  Renal/GU negative Renal ROS     Musculoskeletal   Abdominal   Peds  Hematology   Anesthesia Other Findings   Reproductive/Obstetrics                             Anesthesia Physical Anesthesia Plan  ASA: 3  Anesthesia Plan: General   Post-op Pain Management:    Induction: Intravenous  PONV Risk Score and Plan: Treatment may vary due to age or medical condition and Ondansetron  Airway Management Planned: Oral ETT  Additional Equipment:   Intra-op Plan:   Post-operative Plan: Extubation in OR  Informed Consent: I have reviewed the patients History and Physical, chart, labs and discussed the procedure including the risks, benefits and alternatives for the proposed anesthesia with the patient or authorized representative who has indicated his/her understanding and acceptance.     Dental advisory given  Plan Discussed with: CRNA and Anesthesiologist  Anesthesia Plan Comments:         Anesthesia Quick Evaluation

## 2021-07-30 NOTE — Anesthesia Postprocedure Evaluation (Signed)
Anesthesia Post Note  Patient: Todd Dunn  Procedure(s) Performed: AMPUTATION ABOVE KNEE (Right: Knee)     Patient location during evaluation: PACU Anesthesia Type: General Level of consciousness: awake Pain management: pain level controlled Vital Signs Assessment: post-procedure vital signs reviewed and stable Respiratory status: spontaneous breathing Cardiovascular status: stable Postop Assessment: no apparent nausea or vomiting Anesthetic complications: no   No notable events documented.  Last Vitals:  Vitals:   07/30/21 1810 07/30/21 1819  BP: (!) 143/76 117/66  Pulse: 69 72  Resp: 19   Temp: 36.4 C   SpO2: 97% 99%    Last Pain:  Vitals:   07/30/21 1810  TempSrc: Oral  PainSc:                  [name]

## 2021-07-30 NOTE — Op Note (Signed)
° ° °  NAME: Todd Dunn    MRN: [id number] DOB: 1940/06/28    DATE OF OPERATION: 07/30/2021  PREOP DIAGNOSIS:    Right lower extremity wound with an unreconstructable vascular disease  POSTOP DIAGNOSIS:    Right lower extremity wound  PROCEDURE:    Right above-knee amputation  SURGEON: Broadus John  ASSIST: Paulo Fruit  ANESTHESIA: General  EBL: 34  INDICATIONS:    Todd Dunn is a 81 y.o. male who presented to the emergency department assisted by his granddaughter with right lower extremity rest pain. 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 Carlis Abbott on 03/26/2021.  He has subsequently undergone a great toe amputation by Dr. Sharol Given on 04/01/2021.  He was seen in the office 2 days ago.  His bypass graft is known to be occluded.  He is not a candidate for further procedures.  He was discharged from the office with pain medicine and told to contact us when he was ready for amputation  FINDINGS:   Viable muscle in the right thigh  TECHNIQUE:   Patient was brought to the OR laid in the supine position.  General anesthesia was induced and the patient was prepped and draped in standard fashion.  A timeout was performed.  A fish-mouth incision line was drawn 7cm from the patella for the right above-knee amputation..  A tourniquet was placed on the proximal thigh and inflated to 300 mmHg.  Sharp dissection was used to the femur.  Thigh muscles were divided and femoral artery and vein identified.  These were oversewn using two, 2-0 silk pop stick ties and buttressed with a large clip.  Once the circumferential exposure of the femur was complete, a reciprocating saw was used to divide the femur. This was followed by amputation knife through the back of the thigh.    The tourniquet was released and bleeding vessels oversewn using 2-0 silk suture. The sciatic nerve was identified, pulled to length and ligated with a zero  silk tie. Once hemostasis was achieved, warm saline was brought to the field and the wound bed was irrigated.  I used a 0 Vicryl to close the periosteal tissue around the femur.  There was  bleeding from the marrow.  Next, my attention turned to closure of the fascial layer.  This was done using 2-0 Vicryl pop suture.  The fascial layer was closed along the entirety of the incision.  A stapler was brought to the field to oppose the dermis.  An occlusive dressing was placed and the patient was taken the PACU in stable condition.  Given the complexity of the case a first assistant was necessary in order to expedient the procedure and safely perform the technical aspects of the operation.   Given the complexity of the case a first assistant was necessary in order to expedite the procedure and safely perform the technical aspects of the operation.  Macie Burows, MD [facility]  DATE OF DICTATION:   07/30/2021

## 2021-07-30 NOTE — Progress Notes (Signed)
Pt is A&O x2, I called pt's grand daughter [name] to complete pre-procedure checklist questions.

## 2021-07-30 NOTE — ED Notes (Addendum)
RN Mel Almond notified this RN about pt's pulse ox being in the low 40's. This RN proceeded to check on this pt and found his radial pulse to be accurate with the pulse found on the pulse ox. MD's Horton and Linna Hoff were notified and came to check on pt. An EKG was obtained where it was noted that pt has several PVC's and had an ECG rate in the 70's. MD Shalhoub was paged regarding the situation.

## 2021-07-30 NOTE — Anesthesia Procedure Notes (Signed)
Procedure Name: Intubation Date/Time: 07/30/2021 3:35 PM Performed by: Reece Agar, CRNA Pre-anesthesia Checklist: Patient identified, Emergency Drugs available, Suction available and Patient being monitored Patient Re-evaluated:Patient Re-evaluated prior to induction Oxygen Delivery Method: Circle System Utilized Preoxygenation: Pre-oxygenation with 100% oxygen Induction Type: IV induction Ventilation: Mask ventilation without difficulty Laryngoscope Size: Mac and 3 Grade View: Grade I Tube type: Oral Tube size: 7.5 mm Number of attempts: 1 Airway Equipment and Method: Stylet Placement Confirmation: ETT inserted through vocal cords under direct vision, positive ETCO2 and breath sounds checked- equal and bilateral Secured at: 23 cm Tube secured with: Tape Dental Injury: Teeth and Oropharynx as per pre-operative assessment

## 2021-07-30 NOTE — Progress Notes (Signed)
[place]   PATIENT NAME: Todd Dunn    MR#:  [id number]  DATE OF BIRTH:  03/21/1941  DATE OF ADMISSION:  07/29/2021  PRIMARY CARE PHYSICIAN: [name], NP   Patient is coming from: Home  REQUESTING/REFERRING PHYSICIAN: Deno Etienne, DO   CHIEF COMPLAINT:   Chief Complaint  Patient presents with   Leg Pain  Right foot pain  HISTORY OF PRESENT ILLNESS:  Todd Dunn is a 81 y.o. male with medical history significant for coronary artery disease, CVA, BPH, GERD, hypertension, SSS, s/p PE AICD neurogenic bladder, who presented to the ER with acute onset of right second toe worsening pain with associated right foot pain.  The patient had a right leg bypass surgery in October and right great toe injury at that time when he had ray resection.  He hit his right second toe on the shower door and it is now with black discoloration. He was just seen by vascular surgery yesterday, thought to have an occlusion at the level of his knee and failed bypass.  He was apparently advised that he may need right AKA.  He denies any fever or chills.  No nausea or vomiting or abdominal pain.  No chest pain or palpitations.  No cough or wheezing or hemoptysis.  No other bleeding diathesis.  He was placed on hydrocodone without not to be acting like himself.  ED Course: When he came to the ER, his BP was 125/106 with a heart rate of 52 otherwise normal vital signs.  Labs revealed an albumin of 2.9 and total protein of 6.1 with alk phos of 139.  CBC showed anemia better than previous levels.  Influenza antigens and COVID-19 PCR came back negative.  He had a recent UTI on 2/16 for which she was given p.o. Keflex.  Imaging: Right foot x-ray showed prior resection of the first ray with no acute fracture or subluxation.  Dr. Trula Slade was notified about the patient and is aware.  He will be admitted to a med-surgical bed for further evaluation and management.  Subjective Patient resting  comfortably  VITAL SIGNS:  Blood pressure 114/72, pulse (!) 37, temperature 97.7 F (36.5 C), resp. rate 15, height 6' (1.829 m), weight 59 kg, SpO2 100 %.  PHYSICAL EXAMINATION:  Physical Exam  GENERAL:  81 y.o.-year-old patient lying in the bed with no acute distress.  EYES: Pupils equal, round, reactive to light and accommodation. No scleral icterus. Extraocular muscles intact.  HEENT: Head atraumatic, normocephalic. Oropharynx and nasopharynx clear.  NECK:  Supple, no jugular venous distention. No thyroid enlargement, no tenderness.  LUNGS: Normal breath sounds bilaterally, no wheezing, rales,rhonchi or crepitation. No use of accessory muscles of respiration.  CARDIOVASCULAR: Regular rate and rhythm, S1, S2 normal. No murmurs, rubs, or gallops.  ABDOMEN: Soft, nondistended, nontender. Bowel sounds present. No organomegaly or mass.  EXTREMITIES: No pedal edema, cyanosis, or clubbing.  NEUROLOGIC: Cranial nerves II through XII are intact. Muscle strength 5/5 in all extremities. Sensation intact. Gait not checked.  PSYCHIATRIC: The patient is alert and oriented x 3.  Normal affect and good eye contact. SKIN/vascular: Right ray resection and black discoloration of the second toe with associated tenderness all over the foot and right second toe.  I cannot palpate a DP or PT pulses.    LABORATORY PANEL:   CBC Recent Labs  Lab 07/30/21 0533  WBC 6.9  HGB 11.1*  HCT 36.7*  PLT 174    ------------------------------------------------------------------------------------------------------------------  Chemistries  Recent Labs  Lab 07/29/21 2325 07/30/21 0533  NA 140 140  K 3.6 3.8  CL 103 106  CO2 31 26  GLUCOSE 129* 121*  BUN 19 14  CREATININE 1.02 0.72  CALCIUM 9.7 9.5  AST 14*  --   ALT 12  --   ALKPHOS 139*  --   BILITOT 0.8  --     ------------------------------------------------------------------------------------------------------------------  Cardiac  Enzymes No results for input(s): TROPONINI in the last 168 hours. ------------------------------------------------------------------------------------------------------------------  RADIOLOGY:  DG Foot Complete Right  Result Date: 07/29/2021 CLINICAL DATA:  Foot injury. Leg pain. Struck second toe in the shower, black in color. EXAM: RIGHT FOOT COMPLETE - 3+ VIEW COMPARISON:  None. FINDINGS: Prior resection of the first ray. Hammertoe deformity of the digits limits assessment. There is no evidence of acute fracture. Mild degenerative change of the midfoot. No erosion, bony destruction, or periosteal reaction. No soft tissue gas. No radiopaque foreign body IMPRESSION: Prior resection of the first ray. No acute fracture or subluxation. Electronically Signed   By: Keith Rake M.D.   On: 07/29/2021 22:05   VAS [place] ABI WITH/WO TBI  Result Date: 07/28/2021  LOWER EXTREMITY DOPPLER STUDY Patient Name:  Todd Dunn  Date of Exam:   07/28/2021 Medical Rec #: [id number]           Accession #:    [id number] Date of Birth: 07-05-1940           Patient Gender: M Patient Age:   63 years Exam Location:  [facility] Procedure:      VAS [place] ABI WITH/WO TBI Referring Phys: Risa Grill --------------------------------------------------------------------------------  Indications: Peripheral artery disease. High Risk         Hypertension, hyperlipidemia, past history of smoking, prior Factors:          CVA.  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 Right common                         femoral to posterior tibial artery bypass graft                         03/26/2021. Performing Technologist: Delorise Shiner RVT  Examination Guidelines: A complete evaluation includes at minimum, Doppler waveform signals and systolic blood pressure reading at the level of bilateral brachial, anterior tibial, and posterior tibial arteries, when vessel segments are accessible. Bilateral testing is  considered an integral part of a complete examination. Photoelectric Plethysmograph (PPG) waveforms and toe systolic pressure readings are included as required and additional duplex testing as needed. Limited examinations for reoccurring indications may be performed as noted.  ABI Findings: +---------+------------------+-----+-------------------+----------+  Right     Rt Pressure (mmHg) Index Waveform            Comment     +---------+------------------+-----+-------------------+----------+  Brachial  134                                                      +---------+------------------+-----+-------------------+----------+  PTA       30                 0.22  dampened monophasic             +---------+------------------+-----+-------------------+----------+  DP  0                  0.00  absent                          +---------+------------------+-----+-------------------+----------+  Great Toe                                              amputation  +---------+------------------+-----+-------------------+----------+ +---------+------------------+-----+-------------------+-------+  Left      Lt Pressure (mmHg) Index Waveform            Comment  +---------+------------------+-----+-------------------+-------+  Brachial  134                                                   +---------+------------------+-----+-------------------+-------+  PTA       54                 0.40  dampened monophasic          +---------+------------------+-----+-------------------+-------+  DP        36                 0.27  dampened monophasic          +---------+------------------+-----+-------------------+-------+  Great Toe 42                 0.31                               +---------+------------------+-----+-------------------+-------+ +-------+-----------+-----------+------------+------------+  ABI/TBI Today's ABI Today's TBI Previous ABI Previous TBI  +-------+-----------+-----------+------------+------------+  Right   0.22         amputation  1.09         amputation    +-------+-----------+-----------+------------+------------+  Left    0.40        0.31        0.74         0.59          +-------+-----------+-----------+------------+------------+ Bilateral ABIs appear decreased compared to prior study on 04/28/2021.  Summary: Right: Resting right ankle-brachial index indicates severe right lower extremity arterial disease. Left: Resting left ankle-brachial index indicates severe left lower extremity arterial disease. The left toe-brachial index is abnormal.  *See table(s) above for measurements and observations.  Electronically signed by Monica Martinez MD on 07/28/2021 at 11:53:59 AM.    Final    VAS [place] LOWER EXTREMITY BYPASS GRAFT DUPL  Result Date: 07/28/2021 LOWER EXTREMITY ARTERIAL DUPLEX STUDY Patient Name:  SHERRI LEVENHAGEN  Date of Exam:   07/28/2021 Medical Rec #: [id number]           Accession #:    [id number] Date of Birth: 10-24-1940           Patient Gender: M Patient Age:   3 years Exam Location:  [facility] Procedure:      VAS [place] LOWER EXTREMITY BYPASS GRAFT DUPLEX Referring Phys: Risa Grill --------------------------------------------------------------------------------  Indications: Peripheral artery disease. High Risk         Hypertension, hyperlipidemia, past history of smoking, prior Factors:          CVA.  Vascular Interventions: Right great toe amputation  04/01/2021. Right common                         femoral to posterior tibial artery bypass graft                         03/26/2021. Current ABI:            Right: 0.22, Left: 0.40 Performing Technologist: Delorise Shiner RVT  Examination Guidelines: A complete evaluation includes B-mode imaging, spectral Doppler, color Doppler, and power Doppler as needed of all accessible portions of each vessel. Bilateral testing is considered an integral part of a complete examination. Limited examinations for reoccurring indications may be performed  as noted.  +----------+--------+-----+--------+--------+--------+  RIGHT      PSV cm/s Ratio Stenosis Waveform Comments  +----------+--------+-----+--------+--------+--------+  CFA Distal 64                      biphasic           +----------+--------+-----+--------+--------+--------+  Right Graft #1: Right fem-posterior tibial +------------------+--------+--------+--------+--------+                     PSV cm/s Stenosis Waveform Comments  +------------------+--------+--------+--------+--------+  Inflow             47                biphasic           +------------------+--------+--------+--------+--------+  Prox Anastomosis   73                biphasic           +------------------+--------+--------+--------+--------+  Proximal Graft     0                                    +------------------+--------+--------+--------+--------+  Mid Graft                                               +------------------+--------+--------+--------+--------+  Distal Graft       0                                    +------------------+--------+--------+--------+--------+  Distal Anastomosis 0                                    +------------------+--------+--------+--------+--------+  Outflow            0                                    +------------------+--------+--------+--------+--------+ Graft appears to be occluded/ thrombosed.   Summary: Right: Occluded right fem to posterior tibial artery bypass graft.  See table(s) above for measurements and observations. Electronically signed by Monica Martinez MD on 07/28/2021 at 11:54:16 AM.    Final       IMPRESSION AND PLAN:  Principal Problem:   Ischemic toe  1.  Ischemic right foot and second toe. - The patient will be admitted to a med-surgical bed. -  Pain management will be provided. - We will hold off Plavix for potential surgical intervention. - Vascular surgery consult will be obtained. - Dr. Trula Slade was notified about the patient and is aware.  The patient  follows with Dr. Fortunato Curling. - He may need right above-knee amputation due to failed bypass and no options for thrombectomy Dr. Carlis Abbott his vascular surgeon. 2.  Essential hypertension. - We will continue Norvasc. -Plan for amputation on 07/30/2021 by Dr. Deri Fuelling  3.  Recent UTI and associated altered mental status exacerbated by narcotics. - We will continue p.o. Keflex. - We will monitor his mental status.  4.  BPH. - We will continue Proscar.  5.  Dyslipidemia. - We will continue statin therapy.  6.  GERD. - Continue PPI therapy.  Agree with management of night team.  No changes.  No charge today.  DVT prophylaxis: Lovenox to be started postoperatively this afternoon.   Advanced Care Planning:   Code Status: full code. Disposition Plan: Back to previous home environment Consults called: Vascular surgery.                Dispo: The patient is from: Home              Anticipated d/c is to: Home              Patient currently is not medically stable to d/c.              Difficult to place patient: No  Draco Malczewski A M.D on 07/30/2021 at 9:47 AM  [facility]

## 2021-07-31 ENCOUNTER — Encounter (HOSPITAL_COMMUNITY): Payer: Self-pay | Admitting: Vascular Surgery

## 2021-07-31 LAB — SURGICAL PCR SCREEN
MRSA, PCR: NEGATIVE
Staphylococcus aureus: NEGATIVE

## 2021-07-31 NOTE — Progress Notes (Addendum)
[facility]  Subjective  - Not communicating much   Objective 115/61 78 98.2 F (36.8 C) (Axillary) 18 100%  Intake/Output Summary (Last 24 hours) at 07/31/2021 0750 Last data filed at 07/31/2021 0132 Gross per 24 hour  Intake 3320.5 ml  Output 2550 ml  Net 770.5 ml    Right AKA dressing intact clean and dry  Assessment/Planning: POD # 1 right AKA  Will maintain dressing until Monday 08/03/21  Todd Dunn 07/31/2021 7:50 AM  VASCULAR STAFF ADDENDUM: I have independently interviewed and examined the patient. I agree with the above.  No complaints this morning.  Interaction minimal, this is baseline. Stump dressing without strikethrough. Will leave until Monday.  Cassandria Santee, MD [facility] Phone Number: [phone] 07/31/2021 8:27 AM   --  Laboratory Lab Results: Recent Labs    07/29/21 2104 07/30/21 0533  WBC 5.8 6.9  HGB 11.4* 11.1*  HCT 39.5 36.7*  PLT 189 174   BMET Recent Labs    07/29/21 2325 07/30/21 0533  NA 140 140  K 3.6 3.8  CL 103 106  CO2 31 26  GLUCOSE 129* 121*  BUN 19 14  CREATININE 1.02 0.72  CALCIUM 9.7 9.5    COAG Lab Results  Component Value Date   INR 1.1 07/29/2021   INR 1.1 05/11/2021   INR 1.1 04/06/2021   No results found for: PTT

## 2021-07-31 NOTE — Consult Note (Signed)
Patient seen at [facility] urology for urinary retention.  Last office visit we discussed SP tube placement.  Family asked if he could have this done while hospitalized.  I have place the order, but will defer to IR and primary team if its felt not to be appropriate this admission or otherwise can't be done.

## 2021-07-31 NOTE — Progress Notes (Signed)
Todd   PATIENT NAME: Todd Dunn    MR#:  [id number]  DATE OF BIRTH:  1940/12/17  DATE OF ADMISSION:  07/29/2021  PRIMARY CARE PHYSICIAN: [name], NP   Patient is coming from: Home  REQUESTING/REFERRING PHYSICIAN: Deno Etienne, DO   CHIEF COMPLAINT:   Chief Complaint  Patient presents with   Leg Pain  Right foot pain  HISTORY OF PRESENT ILLNESS:  Todd Dunn is a 81 y.o. male with medical history significant for coronary artery disease, CVA, BPH, GERD, hypertension, SSS, s/p PE AICD neurogenic bladder, who presented to the ER with acute onset of right second toe worsening pain with associated right foot pain.  The patient had a right leg bypass surgery in October and right great toe injury at that time when he had ray resection.  He hit his right second toe on the shower door and it is now with black discoloration. He was just seen by vascular surgery yesterday, thought to have an occlusion at the level of his knee and failed bypass.  He was apparently advised that he may need right AKA.  He denies any fever or chills.  No nausea or vomiting or abdominal pain.  No chest pain or palpitations.  No cough or wheezing or hemoptysis.  No other bleeding diathesis.  He was placed on hydrocodone without not to be acting like himself.  ED Course: When he came to the ER, his BP was 125/106 with a heart rate of 52 otherwise normal vital signs.  Labs revealed an albumin of 2.9 and total protein of 6.1 with alk phos of 139.  CBC showed anemia better than previous levels.  Influenza antigens and COVID-19 PCR came back negative.  He had a recent UTI on 2/16 for which she was given p.o. Keflex.  Imaging: Right foot x-ray showed prior resection of the first ray with no acute fracture or subluxation.  Dr. Trula Slade was notified about the patient and is aware.  He will be admitted to a med-surgical bed for further evaluation and management.  Subjective Patient appears  comfortable denies any pain  VITAL SIGNS:  Blood pressure 137/64, pulse 69, temperature 98.8 F (37.1 C), temperature source Oral, resp. rate 16, height 6' (1.829 m), weight 59 kg, SpO2 100 %.  PHYSICAL EXAMINATION:  Physical Exam  GENERAL:  81 y.o.-year-old patient lying in the bed with no acute distress.  EYES: Pupils equal, round, reactive to light and accommodation. No scleral icterus. Extraocular muscles intact.  HEENT: Head atraumatic, normocephalic. Oropharynx and nasopharynx clear.  NECK:  Supple, no jugular venous distention. No thyroid enlargement, no tenderness.  LUNGS: Normal breath sounds bilaterally, no wheezing, rales,rhonchi or crepitation. No use of accessory muscles of respiration.  CARDIOVASCULAR: Regular rate and rhythm, S1, S2 normal. No murmurs, rubs, or gallops.  ABDOMEN: Soft, nondistended, nontender. Bowel sounds present. No organomegaly or mass.  EXTREMITIES: No pedal edema, cyanosis, or clubbing.  NEUROLOGIC: Cranial nerves II through XII are intact. Muscle strength 5/5 in all extremities. Sensation intact. Gait not checked.  PSYCHIATRIC: The patient is alert and oriented x 3.  Normal affect and good eye contact. SKIN/vascular: Right ray resection and black discoloration of the second toe with associated tenderness all over the foot and right second toe.  I cannot palpate a DP or PT pulses.    LABORATORY PANEL:   CBC Recent Labs  Lab 07/30/21 0533  WBC 6.9  HGB 11.1*  HCT 36.7*  PLT  174    ------------------------------------------------------------------------------------------------------------------  Chemistries  Recent Labs  Lab 07/29/21 2325 07/30/21 0533  NA 140 140  K 3.6 3.8  CL 103 106  CO2 31 26  GLUCOSE 129* 121*  BUN 19 14  CREATININE 1.02 0.72  CALCIUM 9.7 9.5  AST 14*  --   ALT 12  --   ALKPHOS 139*  --   BILITOT 0.8  --      ------------------------------------------------------------------------------------------------------------------  Cardiac Enzymes No results for input(s): TROPONINI in the last 168 hours. ------------------------------------------------------------------------------------------------------------------  RADIOLOGY:  DG Foot Complete Right  Result Date: 07/29/2021 CLINICAL DATA:  Foot injury. Leg pain. Struck second toe in the shower, black in color. EXAM: RIGHT FOOT COMPLETE - 3+ VIEW COMPARISON:  None. FINDINGS: Prior resection of the first ray. Hammertoe deformity of the digits limits assessment. There is no evidence of acute fracture. Mild degenerative change of the midfoot. No erosion, bony destruction, or periosteal reaction. No soft tissue gas. No radiopaque foreign body IMPRESSION: Prior resection of the first ray. No acute fracture or subluxation. Electronically Signed   By: Keith Rake M.D.   On: 07/29/2021 22:05      IMPRESSION AND PLAN:  Principal Problem:   Ischemic toe  1.  Ischemic right foot and second toe. - The patient will be admitted to a med-surgical bed. - Pain management will be provided. 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 Unk Lightning  2.  Essential hypertension. - We will continue Norvasc. -Plan for amputation on 07/30/2021 by vascular  3.  Recent UTI and associated altered mental status exacerbated by narcotics. - We will continue p.o. Keflex. - We will monitor his mental status.  4.  BPH. - We will continue Proscar.  5.  Dyslipidemia. - We will continue statin therapy.  6.  GERD. - Continue PPI therapy.    DVT prophylaxis: Lovenox to be started postoperatively this afternoon.   Advanced Care Planning:   Code Status: full code. Disposition Plan: Back to previous home environment Consults called: Vascular surgery.                Dispo: The patient is from: Home              Anticipated d/c is  to: Home              Patient currently is not medically stable to d/c.              Difficult to place patient: No  Todd Dunn A M.D on 07/31/2021 at 10:18 AM  [facility]

## 2021-08-01 ENCOUNTER — Encounter (HOSPITAL_COMMUNITY): Payer: Self-pay | Admitting: Internal Medicine

## 2021-08-01 NOTE — Progress Notes (Signed)
°  Transition of Care (TOC) Screening Note   Patient Details  Name: Todd Dunn Date of Birth: 02-16-1941   Transition of Care [facility]) CM/SW Contact:    Bary Castilla, LCSW Phone Number: 08/01/2021, 2:10 PM    Transition of Care Department [facility]) has reviewed patient and no TOC needs have been identified at this time. We will continue to monitor patient advancement through interdisciplinary progression rounds. If new patient transition needs arise, please place a TOC consult.

## 2021-08-01 NOTE — Progress Notes (Addendum)
[name]   PATIENT NAME: Todd Dunn    MR#:  [id number]  DATE OF BIRTH:  1941-01-07  DATE OF ADMISSION:  07/29/2021  PRIMARY CARE PHYSICIAN: [name], NP   Patient is coming from: Home  REQUESTING/REFERRING PHYSICIAN: Deno Etienne, DO   CHIEF COMPLAINT:   Chief Complaint  Patient presents with   Leg Pain  Right foot pain  HISTORY OF PRESENT ILLNESS:  Todd Dunn is a 81 y.o. male with medical history significant for coronary artery disease, CVA, BPH, GERD, hypertension, SSS, s/p PE AICD neurogenic bladder, who presented to the ER with acute onset of right second toe worsening pain with associated right foot pain.  The patient had a right leg bypass surgery in October and right great toe injury at that time when he had ray resection.  He hit his right second toe on the shower door and it is now with black discoloration. He was just seen by vascular surgery yesterday, thought to have an occlusion at the level of his knee and failed bypass.  He was apparently advised that he may need right AKA.  He denies any fever or chills.  No nausea or vomiting or abdominal pain.  No chest pain or palpitations.  No cough or wheezing or hemoptysis.  No other bleeding diathesis.  He was placed on hydrocodone without not to be acting like himself.  ED Course: When he came to the ER, his BP was 125/106 with a heart rate of 52 otherwise normal vital signs.  Labs revealed an albumin of 2.9 and total protein of 6.1 with alk phos of 139.  CBC showed anemia better than previous levels.  Influenza antigens and COVID-19 PCR came back negative.  He had a recent UTI on 2/16 for which she was given p.o. Keflex.  Imaging: Right foot x-ray showed prior resection of the first ray with no acute fracture or subluxation.  Dr. Trula Slade was notified about the patient and is aware.  He will be admitted to a med-surgical bed for further evaluation and management.  Subjective No  complaints  VITAL SIGNS:  Blood pressure (!) 154/51, pulse 82, temperature 98 F (36.7 C), temperature source Oral, resp. rate 14, height 6' (1.829 m), weight 59 kg, SpO2 94 %.  PHYSICAL EXAMINATION:  Physical Exam  GENERAL:  81 y.o.-year-old patient lying in the bed with no acute distress.  EYES: Pupils equal, round, reactive to light and accommodation. No scleral icterus. Extraocular muscles intact.  HEENT: Head atraumatic, normocephalic. Oropharynx and nasopharynx clear.  NECK:  Supple, no jugular venous distention. No thyroid enlargement, no tenderness.  LUNGS: Normal breath sounds bilaterally, no wheezing, rales,rhonchi or crepitation. No use of accessory muscles of respiration.  CARDIOVASCULAR: Regular rate and rhythm, S1, S2 normal. No murmurs, rubs, or gallops.  ABDOMEN: Soft, nondistended, nontender. Bowel sounds present. No organomegaly or mass.  EXTREMITIES: No pedal edema, cyanosis, or clubbing.  NEUROLOGIC: Cranial nerves II through XII are intact. Muscle strength 5/5 in all extremities. Sensation intact. Gait not checked.  PSYCHIATRIC: The patient is alert and oriented x 3.  Normal affect and good eye contact. Extremities.  Dressing clean dry and intact    LABORATORY PANEL:   CBC Recent Labs  Lab 07/30/21 0533  WBC 6.9  HGB 11.1*  HCT 36.7*  PLT 174    ------------------------------------------------------------------------------------------------------------------  Chemistries  Recent Labs  Lab 07/29/21 2325 07/30/21 0533  NA 140 140  K 3.6 3.8  CL 103  106  CO2 31 26  GLUCOSE 129* 121*  BUN 19 14  CREATININE 1.02 0.72  CALCIUM 9.7 9.5  AST 14*  --   ALT 12  --   ALKPHOS 139*  --   BILITOT 0.8  --     ------------------------------------------------------------------------------------------------------------------  Cardiac Enzymes No results for input(s): TROPONINI in the last 168  hours. ------------------------------------------------------------------------------------------------------------------  RADIOLOGY:  No results found.    IMPRESSION AND PLAN:  Principal Problem:   Ischemic toe  1.  Ischemic right foot and second toe. - The patient will be admitted to a med-surgical bed. - Pain management will be provided. 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 Unk Lightning  2.  Essential hypertension. - We will continue Norvasc. -Plan for amputation on 07/30/2021 by vascular  3.  Recent UTI and associated altered mental status exacerbated by narcotics. - We will continue p.o. Keflex. - We will monitor his mental status.  4.  BPH./Urinary retention - We will continue Proscar. -Okay for SP tube placement by urology  5.  Dyslipidemia. - We will continue statin therapy.  6.  GERD. - Continue PPI therapy.   Obtain TOC consult for potential placement/rehab based on family wishes  DVT prophylaxis: Lovenox to be started postoperatively this afternoon.   Advanced Care Planning:   Code Status: full code. Disposition Plan: Back to previous home environment Consults called: Vascular surgery.                Dispo: The patient is from: Home              Anticipated d/c is to: Home              Patient currently is not medically stable to d/c.              Difficult to place patient: No  [name] A M.D on 08/01/2021 at 10:34 AM  [facility]

## 2021-08-01 NOTE — Progress Notes (Addendum)
[facility]  Subjective  - more alert patient has expressive aphasia.   Objective (!) 154/51 82 98 F (36.7 C) (Oral) 14 94%  Intake/Output Summary (Last 24 hours) at 08/01/2021 0950 Last data filed at 08/01/2021 0448 Gross per 24 hour  Intake 3361.53 ml  Output 3900 ml  Net -538.47 ml    Right AKA dressing clean and dry   Assessment/Planning: POD # 2 right AKA  Plans for dressing change Monday right AKA Plan to order stump sock from [employer] Monday pending incision and dressing change.  Roxy Horseman 08/01/2021 9:50 AM --  Laboratory Lab Results: Recent Labs    07/29/21 2104 07/30/21 0533  WBC 5.8 6.9  HGB 11.4* 11.1*  HCT 39.5 36.7*  PLT 189 174   BMET Recent Labs    07/29/21 2325 07/30/21 0533  NA 140 140  K 3.6 3.8  CL 103 106  CO2 31 26  GLUCOSE 129* 121*  BUN 19 14  CREATININE 1.02 0.72  CALCIUM 9.7 9.5    COAG Lab Results  Component Value Date   INR 1.1 07/29/2021   INR 1.1 05/11/2021   INR 1.1 04/06/2021   No results found for: PTT   I have seen and evaluated the patient. I agree with the PA note as documented above. S/P right AKA.  Will remove dressing Monday.  Marty Heck, MD [facility] Office: [phone]

## 2021-08-01 NOTE — Consult Note (Addendum)
Chief Complaint: Patient was seen in consultation today for image guided suprapubic catheter placement Chief Complaint  Patient presents with   Leg Pain   at the request of Louis Meckel  Referring Physician(s): Louis Meckel  Supervising Physician: Jacqulynn Cadet  Patient Status: [facility] - In-pt  History of Present Illness: Todd Dunn is a 81 y.o. male with PMHs of HTN, CAD, sick sinus syndrome s/p ICD placement, neurogenic bladder, urinary retention due to BPH who has a chronic indwelling Foley catheter.  Patient presented to ED with acute onset of right second toe pain, found to have ischemic right foot and second toe.  Vascular surgery was consulted and patient ultimately underwent amputation above right knee on 07/30/2021. Per note, there has been discussion between Urology team and family members regarding a suprapubic catheter placement.  IR was requested for image guided suprapubic catheter placement.  Patient seen in his inpatient room today. Laying in bed, not in acute distress. Arousable to verbal stimuli but he goes back to sleep immediately. Does not follow commands, ROS not obtained.   Past Medical History:  Diagnosis Date   BPH (benign prostatic hyperplasia)    CAD (coronary artery disease)    Chronic indwelling Foley catheter    CVA (cerebral vascular accident) ([place])    GERD (gastroesophageal reflux disease)    HTN (hypertension)    Neurogenic bladder    SSS (sick sinus syndrome) ([place])    s/p MDT ICD   Vitamin D deficiency     Past Surgical History:  Procedure Laterality Date   ABDOMINAL AORTOGRAM W/LOWER EXTREMITY N/A 03/23/2021   Procedure: ABDOMINAL AORTOGRAM W/LOWER EXTREMITY;  Surgeon: Waynetta Sandy, MD;  Location: [facility];  Service: Cardiovascular;  Laterality: N/A;   AMPUTATION Right 04/01/2021   Procedure: RIGHT GREAT TOE AMPUTATION;  Surgeon: Newt Minion, MD;  Location: [facility];  Service: Orthopedics;   Laterality: Right;   AMPUTATION Right 07/30/2021   Procedure: AMPUTATION ABOVE KNEE;  Surgeon: Broadus John, MD;  Location: [facility];  Service: Vascular;  Laterality: Right;   APPENDECTOMY     APPLICATION OF WOUND VAC Right 04/01/2021   Procedure: APPLICATION OF WOUND VAC;  Surgeon: Newt Minion, MD;  Location: [facility];  Service: Orthopedics;  Laterality: Right;   CARDIAC SURGERY     FEMORAL-TIBIAL BYPASS GRAFT Right 03/26/2021   Procedure: BYPASS GRAFT FEMORAL-TIBIAL ARTERY.  Harvest right leg saphenous vein, right vein patch angioplasty posterior tibial.;  Surgeon: Marty Heck, MD;  Location: [facility];  Service: Vascular;  Laterality: Right;   PACEMAKER PLACEMENT      Allergies: Food, Aspirin, and [place] clam (m. mercenaria) skin test  Medications: Prior to Admission medications   Medication Sig Start Date End Date Taking? Authorizing Provider  amLODipine (NORVASC) 10 MG tablet Take 1 tablet (10 mg total) by mouth daily. 07/17/21  Yes [name], NP  cephALEXin (KEFLEX) 500 MG capsule Take 1 capsule (500 mg total) by mouth 3 (three) times daily. 2/42/35  Yes Delora Fuel, MD  clopidogrel (PLAVIX) 75 MG tablet Take 1 tablet (75 mg total) by mouth at bedtime. 07/17/21  Yes [name], NP  divalproex (DEPAKOTE) 125 MG DR tablet Take 1 tablet (125 mg total) by mouth at bedtime. 07/17/21 10/15/21 Yes [name], NP  finasteride (PROSCAR) 5 MG tablet Take 1 tablet (5 mg total) by mouth every morning. 06/24/21 09/22/21 Yes Dahal, Marlowe Aschoff, MD  HYDROcodone-acetaminophen (NORCO) 5-325 MG tablet Take 1  tablet by mouth every 6 (six) hours as needed for moderate pain. 07/28/21  Yes Rhyne, Samantha J, PA-C  pantoprazole (PROTONIX) 40 MG tablet TAKE 1 TABLET(40 MG) BY MOUTH DAILY Patient taking differently: Take 40 mg by mouth daily. 07/21/21  Yes Charlott Rakes, MD  rosuvastatin (CRESTOR) 10 MG tablet Take 1 tablet (10 mg total) by mouth every morning. 07/17/21  Yes [name], NP  vitamin B-12 (CYANOCOBALAMIN) 1000 MCG tablet Take 1 tablet (1,000 mcg total) by mouth daily. 07/17/21  Yes [name], NP  Vitamin D, Ergocalciferol, (DRISDOL) 1.25 MG (50000 UNIT) CAPS capsule Take 50,000 Units by mouth every Monday.   Yes [provider]  acetaminophen (TYLENOL) 325 MG tablet Take 650 mg by mouth every 4 (four) hours as needed for mild pain. Patient not taking: Reported on 07/30/2021    [provider]  albuterol (VENTOLIN HFA) 108 (90 Base) MCG/ACT inhaler Inhale 2 puffs into the lungs every 6 (six) hours as needed for wheezing or shortness of breath. Patient not taking: Reported on 07/30/2021 06/24/21   Terrilee Croak, MD  nitroGLYCERIN (NITROSTAT) 0.4 MG SL tablet Place 0.4 mg under the tongue every 5 (five) minutes x 3 doses as needed for chest pain. Patient not taking: Reported on 07/30/2021    [provider]  polyethylene glycol (MIRALAX / GLYCOLAX) 17 g packet Take 17 g by mouth daily as needed for mild constipation. Patient not taking: Reported on 07/30/2021 07/17/21   [name], NP     Family History  Family history unknown: Yes    Social History   Socioeconomic History   Marital status: Widowed    Spouse name: Not on file   Number of children: Not on file   Years of education: Not on file   Highest education level: Not on file  Occupational History   Not on file  Tobacco Use   Smoking status: Former    Types: Cigarettes    Passive exposure: Never   Smokeless tobacco: Never  Substance and Sexual Activity   Alcohol use: Not Currently   Drug use: Never   Sexual activity: Not on file  Other Topics Concern   Not on file  Social History Narrative   ** Merged History Encounter **       Social Determinants of Health   Financial Resource Strain: Not on file  Food Insecurity: Not on file  Transportation Needs: Not on file  Physical Activity: Not on file  Stress: Not on file  Social Connections: Not on  file     Review of Systems: A 12 point ROS discussed and pertinent positives are indicated in the HPI above.  All other systems are negative.  Vital Signs: BP (!) 123/45 (BP Location: Right Arm)    Pulse 80    Temp 98 F (36.7 C) (Oral)    Resp 17    Ht 6' (1.829 m)    Wt 130 lb (59 kg)    SpO2 91%    BMI 17.63 kg/m    Physical Exam Vitals reviewed.  Constitutional:      General: He is not in acute distress.    Appearance: He is ill-appearing.     Comments: Somnolent, chronically ill-appearing male  HENT:     Head: Normocephalic and atraumatic.  Cardiovascular:     Rate and Rhythm: Normal rate and regular rhythm.     Heart sounds: Normal heart sounds.  Pulmonary:     Effort: Pulmonary effort  is normal.     Breath sounds: Normal breath sounds.  Abdominal:     General: Abdomen is flat. Bowel sounds are normal.     Palpations: Abdomen is soft.  Skin:    General: Skin is warm and dry.     Coloration: Skin is not jaundiced or pale.  Neurological:     Comments: Arousable to verbal stimuli but falls asleep immediately. Unable to answer questions, unable to follow commands. Does not follow commands.    MD Evaluation Airway: WNL Heart: WNL Abdomen: WNL Chest/ Lungs: WNL ASA  Classification: 3 Mallampati/Airway Score: Two (Very limited assessment, pt does not follow commands)  Imaging: DG Foot Complete Right  Result Date: 07/29/2021 CLINICAL DATA:  Foot injury. Leg pain. Struck second toe in the shower, black in color. EXAM: RIGHT FOOT COMPLETE - 3+ VIEW COMPARISON:  None. FINDINGS: Prior resection of the first ray. Hammertoe deformity of the digits limits assessment. There is no evidence of acute fracture. Mild degenerative change of the midfoot. No erosion, bony destruction, or periosteal reaction. No soft tissue gas. No radiopaque foreign body IMPRESSION: Prior resection of the first ray. No acute fracture or subluxation. Electronically Signed   By: Keith Rake M.D.    On: 07/29/2021 22:05   VAS [place] ABI WITH/WO TBI  Result Date: 07/28/2021  LOWER EXTREMITY DOPPLER STUDY Patient Name:  Todd Dunn  Date of Exam:   07/28/2021 Medical Rec #: [id number]           Accession #:    [id number] Date of Birth: 01-27-1941           Patient Gender: M Patient Age:   32 years Exam Location:  [facility] Procedure:      VAS [place] ABI WITH/WO TBI Referring Phys: Risa Grill --------------------------------------------------------------------------------  Indications: Peripheral artery disease. High Risk         Hypertension, hyperlipidemia, past history of smoking, prior Factors:          CVA.  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 Right common                         femoral to posterior tibial artery bypass graft                         03/26/2021. Performing Technologist: Delorise Shiner RVT  Examination Guidelines: A complete evaluation includes at minimum, Doppler waveform signals and systolic blood pressure reading at the level of bilateral brachial, anterior tibial, and posterior tibial arteries, when vessel segments are accessible. Bilateral testing is considered an integral part of a complete examination. Photoelectric Plethysmograph (PPG) waveforms and toe systolic pressure readings are included as required and additional duplex testing as needed. Limited examinations for reoccurring indications may be performed as noted.  ABI Findings: +---------+------------------+-----+-------------------+----------+  Right     Rt Pressure (mmHg) Index Waveform            Comment     +---------+------------------+-----+-------------------+----------+  Brachial  134                                                      +---------+------------------+-----+-------------------+----------+  PTA       30  0.22  dampened monophasic             +---------+------------------+-----+-------------------+----------+  DP        0                  0.00   absent                          +---------+------------------+-----+-------------------+----------+  Great Toe                                              amputation  +---------+------------------+-----+-------------------+----------+ +---------+------------------+-----+-------------------+-------+  Left      Lt Pressure (mmHg) Index Waveform            Comment  +---------+------------------+-----+-------------------+-------+  Brachial  134                                                   +---------+------------------+-----+-------------------+-------+  PTA       54                 0.40  dampened monophasic          +---------+------------------+-----+-------------------+-------+  DP        36                 0.27  dampened monophasic          +---------+------------------+-----+-------------------+-------+  Great Toe 42                 0.31                               +---------+------------------+-----+-------------------+-------+ +-------+-----------+-----------+------------+------------+  ABI/TBI Today's ABI Today's TBI Previous ABI Previous TBI  +-------+-----------+-----------+------------+------------+  Right   0.22        amputation  1.09         amputation    +-------+-----------+-----------+------------+------------+  Left    0.40        0.31        0.74         0.59          +-------+-----------+-----------+------------+------------+ Bilateral ABIs appear decreased compared to prior study on 04/28/2021.  Summary: Right: Resting right ankle-brachial index indicates severe right lower extremity arterial disease. Left: Resting left ankle-brachial index indicates severe left lower extremity arterial disease. The left toe-brachial index is abnormal.  *See table(s) above for measurements and observations.  Electronically signed by Monica Martinez MD on 07/28/2021 at 11:53:59 AM.    Final    VAS [place] LOWER EXTREMITY BYPASS GRAFT DUPL  Result Date: 07/28/2021 LOWER EXTREMITY ARTERIAL DUPLEX STUDY Patient  Name:  Todd Dunn  Date of Exam:   07/28/2021 Medical Rec #: [id number]           Accession #:    [id number] Date of Birth: 03/26/1941           Patient Gender: M Patient Age:   15 years Exam Location:  [facility] Procedure:      VAS [place] LOWER EXTREMITY BYPASS GRAFT DUPLEX Referring Phys: Risa Grill --------------------------------------------------------------------------------  Indications: Peripheral artery disease. High Risk  Hypertension, hyperlipidemia, past history of smoking, prior Factors:          CVA.  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 Right common                         femoral to posterior tibial artery bypass graft                         03/26/2021. Current ABI:            Right: 0.22, Left: 0.40 Performing Technologist: Delorise Shiner RVT  Examination Guidelines: A complete evaluation includes B-mode imaging, spectral Doppler, color Doppler, and power Doppler as needed of all accessible portions of each vessel. Bilateral testing is considered an integral part of a complete examination. Limited examinations for reoccurring indications may be performed as noted.  +----------+--------+-----+--------+--------+--------+  RIGHT      PSV cm/s Ratio Stenosis Waveform Comments  +----------+--------+-----+--------+--------+--------+  CFA Distal 64                      biphasic           +----------+--------+-----+--------+--------+--------+  Right Graft #1: Right fem-posterior tibial +------------------+--------+--------+--------+--------+                     PSV cm/s Stenosis Waveform Comments  +------------------+--------+--------+--------+--------+  Inflow             47                biphasic           +------------------+--------+--------+--------+--------+  Prox Anastomosis   73                biphasic           +------------------+--------+--------+--------+--------+  Proximal Graft     0                                     +------------------+--------+--------+--------+--------+  Mid Graft                                               +------------------+--------+--------+--------+--------+  Distal Graft       0                                    +------------------+--------+--------+--------+--------+  Distal Anastomosis 0                                    +------------------+--------+--------+--------+--------+  Outflow            0                                    +------------------+--------+--------+--------+--------+ Graft appears to be occluded/ thrombosed.   Summary: Right: Occluded right fem to posterior tibial artery bypass graft.  See table(s) above for measurements and observations. Electronically signed by Monica Martinez MD on 07/28/2021 at 11:54:16 AM.    Final     Labs:  CBC: Recent Labs  07/10/21 1538 07/23/21 1941 07/29/21 2104 07/30/21 0533  WBC 4.1 5.5 5.8 6.9  HGB 12.0* 10.9* 11.4* 11.1*  HCT 39.5 36.4* 39.5 36.7*  PLT 145* 203 189 174    COAGS: Recent Labs    02/14/21 1631 04/06/21 0458 05/11/21 1145 07/29/21 2325  INR 1.1 1.1 1.1 1.1  APTT  --   --  30  --     BMP: Recent Labs    07/10/21 1538 07/23/21 1941 07/29/21 2325 07/30/21 0533  NA 143 142 140 140  K 3.5 3.7 3.6 3.8  CL 106 106 103 106  CO2 31 28 31 26   GLUCOSE 108* 106* 129* 121*  BUN 19 16 19 14   CALCIUM 10.1 9.7 9.7 9.5  CREATININE 0.87 0.82 1.02 0.72  GFRNONAA >60 >60 >60 >60    LIVER FUNCTION TESTS: Recent Labs    06/24/21 0121 07/10/21 1538 07/23/21 1941 07/29/21 2325  BILITOT 0.2* 0.4 0.3 0.8  AST 33 12* 16 14*  ALT 47* 10 18 12   ALKPHOS 116 161* 146* 139*  PROT 6.3* 6.8 6.6 6.1*  ALBUMIN 2.7* 3.5 2.9* 2.9*    TUMOR MARKERS: No results for input(s): AFPTM, CEA, CA199, CHROMGRNA in the last 8760 hours.  Assessment and Plan: 81 y.o. male with neurogenic bladder and chronic urinary retention due to BPH who has chronic indwelling Foley catheter.  IR was requested for image guided  suprapubic catheter placement by urology. Case was reviewed and approved by Dr. Laurence Ferrari.  VSS, afebrile- Patient currently being treated for UTI with Keflex 500 mg twice daily CBC stable Hgb 11.1 PLT 174 no leukocytosis INR 1.1 on 07/29/2021 Was on Plavix as an outpatient, has been held since 2/23  On subcu 40 mg Lovenox daily  Patient is currently in status that he cannot make his own medical decisions. The procedure was discussed with granddaughter Ms. Todd Dunn over telephone. Risks and benefits discussed with the granddaughter including bleeding, infection, damage to adjacent structures, and sepsis.  All of the granddaughter's questions were answered, she is agreeable to proceed. Consent signed and in IR.  PLAN  - Procedure is tentatively scheduled for Monday, 08/03/2021 pending IR schedule. - Made N.p.o. at midnight Monday - Continue to hold Plavix  - MAR held Lovenox for Sunday  - Please call IR for questions and concerns regarding SP catheter placement.    Thank you for this interesting consult.  I greatly enjoyed meeting Todd Dunn and look forward to participating in their care.  A copy of this report was sent to the requesting provider on this date.  Electronically Signed: Tera Mater, PA-C 08/01/2021, 11:37 AM   I spent a total of 40 Minutes    in face to face in clinical consultation, greater than 50% of which was counseling/coordinating care for SP catheter placement.   This chart was dictated using voice recognition software.  Despite best efforts to proofread,  errors can occur which can change the documentation meaning.

## 2021-08-02 MED ORDER — ADULT MULTIVITAMIN W/MINERALS CH
1.0000 | ORAL_TABLET | Freq: Every day | ORAL | Status: DC
  Administered 2021-08-02 – 2021-08-05 (×4): 1 via ORAL
  Filled 2021-08-02 (×4): qty 1

## 2021-08-02 MED ORDER — ENSURE ENLIVE PO LIQD
237.0000 mL | Freq: Two times a day (BID) | ORAL | Status: DC
  Administered 2021-08-02 – 2021-08-05 (×6): 237 mL via ORAL

## 2021-08-02 MED ORDER — JUVEN PO PACK
1.0000 | PACK | Freq: Two times a day (BID) | ORAL | Status: DC
  Administered 2021-08-02 – 2021-08-05 (×6): 1 via ORAL
  Filled 2021-08-02 (×6): qty 1

## 2021-08-02 NOTE — Progress Notes (Addendum)
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  Plan to remove dressing tomorrow and order stump sock  Roxy Horseman 08/02/2021 8:15 AM --  Laboratory Lab Results: No results for input(s): WBC, HGB, HCT, PLT in the last 72 hours. BMET No results for input(s): NA, K, CL, CO2, GLUCOSE, BUN, CREATININE, CALCIUM in the last 72 hours.  COAG Lab Results  Component Value Date   INR 1.1 07/29/2021   INR 1.1 05/11/2021   INR 1.1 04/06/2021   No results found for: PTT  I have seen and evaluated the patient. I agree with the PA note as documented above.  Status post right above-knee amputation.  Dressing is clean and dry.  We will remove dressing tomorrow.  Marty Heck, MD [facility] [place] Office: [phone]

## 2021-08-02 NOTE — Progress Notes (Signed)
Initial Nutrition Assessment  DOCUMENTATION CODES:   Underweight  INTERVENTION:   -Recommend updated weight now s/p rt AKA  -Ensure Plus High Protein po BID, each supplement provides 350 kcal and 20 grams of protein.  -Multivitamin with minerals daily  -1 packet Juven BID, each packet provides 95 calories, 2.5 grams of protein (collagen), and 9.8 grams of carbohydrate (3 grams sugar); also contains 7 grams of L-arginine and L-glutamine, 300 mg vitamin C, 15 mg vitamin E, 1.2 mcg vitamin B-12, 9.5 mg zinc, 200 mg calcium, and 1.5 g  Calcium Beta-hydroxy-Beta-methylbutyrate to support wound healing    NUTRITION DIAGNOSIS:   Increased nutrient needs related to chronic illness, post-op healing as evidenced by estimated needs.  GOAL:   Patient will meet greater than or equal to 90% of their needs  MONITOR:   PO intake, Supplement acceptance, Labs, Weight trends, I & O's  REASON FOR ASSESSMENT:   Malnutrition Screening Tool    ASSESSMENT:   81 y.o. male with medical history significant for coronary artery disease, CVA, BPH, GERD, hypertension, SSS, s/p PE AICD neurogenic bladder, who presented to the ER with acute onset of right second toe worsening pain with associated right foot pain. 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 Unk Lightning  Patient currently alert/oriented x 2. Per chart review, difficult to understand. Patient consuming 50-100% of meals today while on regular diet. Pt to be NPO tomorrow for IR procedure.  Will order Ensure and Juven to aid in healing from surgery.  Per weight records, pt has lost 40 lbs since 04/28/21 (23% wt loss  x3 months, significant for time frame). This weight was prior to rt AKA. Needs updated weight now following amputation.  Medications: Vitamin B-12, Vitamin D (first dose 2/27)  Labs reviewed.  NUTRITION - FOCUSED PHYSICAL EXAM:  Unable to complete -working remotely  Diet Order:   Diet Order             Diet NPO time specified  Except for: Sips with Meds  Diet effective midnight           Diet regular Room service appropriate? No; Fluid consistency: Thin  Diet effective now                   EDUCATION NEEDS:   No education needs have been identified at this time  Skin:  Skin Assessment: Skin Integrity Issues: Skin Integrity Issues:: Incisions Incisions: 2/23 right leg  Last BM:  2/24  Height:   Ht Readings from Last 1 Encounters:  07/29/21 6' (1.829 m)    Weight:   Wt Readings from Last 1 Encounters:  07/29/21 59 kg    Ideal Body Weight:   72.8 kg -adjusted for rt AKA  BMI:  Body mass index is 17.63 kg/m.  Estimated Nutritional Needs:   Kcal:  1800-2000  Protein:  100-110g  Fluid:  2L/day  Clayton Bibles, MS, RD, LDN Inpatient Clinical Dietitian Contact information available via Amion

## 2021-08-02 NOTE — Progress Notes (Signed)
°  °  °[place] ° ° °PATIENT NAME: Todd Dunn   ° °MR#:  [id number] ° °DATE OF BIRTH:  02/12/1941 ° °DATE OF ADMISSION:  07/29/2021 ° °PRIMARY CARE PHYSICIAN: Fleming, Zelda W, NP  ° °Patient is coming from: Home ° °REQUESTING/REFERRING PHYSICIAN: Floyd, Dan, DO  ° °CHIEF COMPLAINT:  ° °Chief Complaint  °Patient presents with  ° Leg Pain  °Right foot pain ° °HISTORY OF PRESENT ILLNESS:  °Eustacio L Mangal is a 81 y.o. male with medical history significant for coronary artery disease, CVA, BPH, GERD, hypertension, SSS, s/p PE AICD neurogenic bladder, who presented to the ER with acute onset of right second toe worsening pain with associated right foot pain.  The patient had a right leg bypass surgery in October and right great toe injury at that time when he had ray resection.  He hit his right second toe on the shower door and it is now with black discoloration. He was just seen by vascular surgery yesterday, thought to have an occlusion at the level of his knee and failed bypass.  He was apparently advised that he may need right AKA.  He denies any fever or chills.  No nausea or vomiting or abdominal pain.  No chest pain or palpitations.  No cough or wheezing or hemoptysis.  No other bleeding diathesis.  He was placed on hydrocodone without not to be acting like himself. ° °ED Course: When he came to the ER, his BP was 125/106 with a heart rate of 52 otherwise normal vital signs.  Labs revealed an albumin of 2.9 and total protein of 6.1 with alk phos of 139.  CBC showed anemia better than previous levels.  Influenza antigens and COVID-19 PCR came back negative.  He had a recent UTI on 2/16 for which she was given p.o. Keflex. ° °Imaging: Right foot x-ray showed prior resection of the first ray with no acute fracture or subluxation. ° °Dr. Brabham was notified about the patient and is aware.  He will be admitted to a med-surgical bed for further evaluation and management. ° °Subjective °No complaints.  More  alert. ° °VITAL SIGNS:  °Blood pressure (!) 143/66, pulse 73, temperature 97.6 °F (36.4 °C), temperature source Oral, resp. rate 17, height 6' (1.829 m), weight 59 kg, SpO2 95 %. ° °PHYSICAL EXAMINATION:  °Physical Exam ° °GENERAL:  81 y.o.-year-old patient lying in the bed with no acute distress.  °EYES: Pupils equal, round, reactive to light and accommodation. No scleral icterus. Extraocular muscles intact.  °HEENT: Head atraumatic, normocephalic. Oropharynx and nasopharynx clear.  °NECK:  Supple, no jugular venous distention. No thyroid enlargement, no tenderness.  °LUNGS: Normal breath sounds bilaterally, no wheezing, rales,rhonchi or crepitation. No use of accessory muscles of respiration.  °CARDIOVASCULAR: Regular rate and rhythm, S1, S2 normal. No murmurs, rubs, or gallops.  °ABDOMEN: Soft, nondistended, nontender. Bowel sounds present. No organomegaly or mass.  °EXTREMITIES: No pedal edema, cyanosis, or clubbing.  °NEUROLOGIC: Cranial nerves II through XII are intact. Muscle strength 5/5 in all extremities. Sensation intact. Gait not checked.  °PSYCHIATRIC: The patient is alert and oriented x 83.  Normal affect and good eye contact. °Extremities.  Dressing clean dry and intact ° ° ° °LABORATORY PANEL:  ° °CBC °Recent Labs  °Lab 07/30/21 °0533  °WBC 6.9  °HGB 11.1*  °HCT 36.7*  °PLT 174  ° ° °------------------------------------------------------------------------------------------------------------------ ° °Chemistries  °Recent Labs  °Lab 07/29/21 °2325 07/30/21 °0533  °NA 140 140  °K 3.6 3.8  °  3.8  CL 103 106  CO2 31 26  GLUCOSE 129* 121*  BUN 19 14  CREATININE 1.02 0.72  CALCIUM 9.7 9.5  AST 14*  --   ALT 12  --   ALKPHOS 139*  --   BILITOT 0.8  --     ------------------------------------------------------------------------------------------------------------------  Cardiac Enzymes No results for input(s): TROPONINI in the last 168  hours. ------------------------------------------------------------------------------------------------------------------  RADIOLOGY:  No results found.    IMPRESSION AND PLAN:  Principal Problem:   Ischemic toe  1.  Ischemic right foot and second toe. - The patient will be admitted to a med-surgical bed. - Pain management will be provided. 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 Unk Lightning  2.  Essential hypertension. - We will continue Norvasc. -Plan for amputation on 07/30/2021 by vascular  3.  Recent UTI and associated altered mental status exacerbated by narcotics. - We will continue p.o. Keflex. - We will monitor his mental status.  4.  BPH./Urinary retention - We will continue Proscar. -Okay for SP tube placement by urology  5.  Dyslipidemia. - We will continue statin therapy.  6.  GERD. - Continue PPI therapy.   Obtain TOC consult for potential placement/rehab based on family wishes.  Physical therapy still pending postoperatively.  DVT prophylaxis: Lovenox okay with vascular surgery Advanced Care Planning:   Code Status: full code. Disposition Plan: Back to previous home environment Consults called: Vascular surgery.                Dispo: The patient is from: Home              Anticipated d/c is to: Home              Patient currently is not medically stable to d/c.              Difficult to place patient: Unknown  Harold Mattes A M.D on 08/02/2021 at 10:01 AM  [facility]

## 2021-08-03 ENCOUNTER — Inpatient Hospital Stay (HOSPITAL_COMMUNITY): Payer: Medicare Other

## 2021-08-03 HISTORY — PX: IR US GUIDE BX ASP/DRAIN: IMG2392

## 2021-08-03 LAB — SURGICAL PATHOLOGY

## 2021-08-03 MED ORDER — MIDAZOLAM HCL 2 MG/2ML IJ SOLN
INTRAMUSCULAR | Status: AC | PRN
  Administered 2021-08-03: 1 mg via INTRAVENOUS

## 2021-08-03 MED ORDER — FENTANYL CITRATE (PF) 100 MCG/2ML IJ SOLN
INTRAMUSCULAR | Status: AC
  Filled 2021-08-03: qty 2

## 2021-08-03 MED ORDER — MIDAZOLAM HCL 2 MG/2ML IJ SOLN
INTRAMUSCULAR | Status: AC
  Filled 2021-08-03: qty 2

## 2021-08-03 MED ORDER — IOHEXOL 300 MG/ML  SOLN
100.0000 mL | Freq: Once | INTRAMUSCULAR | Status: DC | PRN
Start: 2021-08-03 — End: 2021-08-05

## 2021-08-03 MED ORDER — SODIUM CHLORIDE 0.9% FLUSH
10.0000 mL | Freq: Two times a day (BID) | INTRAVENOUS | Status: DC
  Administered 2021-08-03 – 2021-08-05 (×5): 10 mL

## 2021-08-03 MED ORDER — LIDOCAINE HCL 1 % IJ SOLN
INTRAMUSCULAR | Status: AC
  Administered 2021-08-03: 10 mL
  Filled 2021-08-03: qty 20

## 2021-08-03 MED ORDER — FENTANYL CITRATE (PF) 100 MCG/2ML IJ SOLN
INTRAMUSCULAR | Status: AC | PRN
Start: 2021-08-03 — End: 2021-08-03
  Administered 2021-08-03: 25 ug via INTRAVENOUS

## 2021-08-03 NOTE — Procedures (Signed)
Vascular and Interventional Radiology Procedure Note  Patient: Todd Dunn DOB: 03/25/1941 Medical Record Number: [id number] Note Date/Time: 08/03/21 9:09 AM   Performing Physician: Michaelle Birks, MD Assistant(s): None  Diagnosis: BPH. Chronic indwelling foley catheter.  Procedure: SUPRAPUBIC DRAINAGE CATHETER PLACEMENT   Anesthesia: Conscious Sedation Complications: None Estimated Blood Loss:  0 mL Specimens: Sent for None  Findings:  Successful Ultrasound and Fluoroscopy-guided suprapubic placement of 14 F catheter into urinary bladder.  Plan:  - Flush drain with 10 mL Normal Saline every 12 hours. - Follow up drain upsize to 16 F Council tip foley in 2 week(s).  See detailed procedure note with images in PACS. The patient tolerated the procedure well without incident or complication and was returned to Floor Bed in stable condition.    Michaelle Birks, MD [facility] Radiology   Pager. [place]

## 2021-08-03 NOTE — Progress Notes (Signed)
OT Cancellation Note  Patient Details Name: Todd Dunn MRN: [id number] DOB: 1941/01/03   Cancelled Treatment:    Reason Eval/Treat Not Completed: Patient at procedure or test/ unavailable (IR. WIll return as schedule allows.)  [place], OTR/L Acute Rehab Pager: [phone] Office: [phone] 08/03/2021, 7:46 AM

## 2021-08-03 NOTE — Progress Notes (Addendum)
°  Progress Note    08/03/2021 7:55 AM 4 Days Post-Op  Subjective:  oriented to person only   Vitals:   08/02/21 1647 08/03/21 0729  BP: 126/71 (!) 141/76  Pulse: 71 73  Resp: 16 16  Temp: 97.8 F (36.6 C) 97.6 F (36.4 C)  SpO2: 96% 95%   Physical Exam: Lungs:  non labored Incisions:  R aKa healing well Neurologic: A&Ox1  CBC    Component Value Date/Time   WBC 6.9 07/30/2021 0533   RBC 4.38 07/30/2021 0533   HGB 11.1 (L) 07/30/2021 0533   HGB 12.0 (L) 07/10/2021 1538   HCT 36.7 (L) 07/30/2021 0533   PLT 174 07/30/2021 0533   PLT 145 (L) 07/10/2021 1538   MCV 83.8 07/30/2021 0533   MCH 25.3 (L) 07/30/2021 0533   MCHC 30.2 07/30/2021 0533   RDW 22.4 (H) 07/30/2021 0533   LYMPHSABS 1.4 07/23/2021 1941   MONOABS 0.5 07/23/2021 1941   EOSABS 0.0 07/23/2021 1941   BASOSABS 0.0 07/23/2021 1941    BMET    Component Value Date/Time   NA 140 07/30/2021 0533   K 3.8 07/30/2021 0533   CL 106 07/30/2021 0533   CO2 26 07/30/2021 0533   GLUCOSE 121 (H) 07/30/2021 0533   BUN 14 07/30/2021 0533   CREATININE 0.72 07/30/2021 0533   CREATININE 0.87 07/10/2021 1538   CALCIUM 9.5 07/30/2021 0533   GFRNONAA >60 07/30/2021 0533   GFRNONAA >60 07/10/2021 1538    INR    Component Value Date/Time   INR 1.1 07/29/2021 2325     Intake/Output Summary (Last 24 hours) at 08/03/2021 0755 Last data filed at 08/03/2021 0729 Gross per 24 hour  Intake 1320 ml  Output 4500 ml  Net -3180 ml     Assessment/Plan:  81 y.o. male is s/p R AKA 4 Days Post-Op   R AKA dressing removed; incision is well appearing; continue dry dressing changes daily; will order stump sock.  Dispo pending therapy eval and family discussion   Dagoberto Ligas, [place] [facility] [phone] 08/03/2021 7:55 AM  VASCULAR STAFF ADDENDUM: I have independently interviewed and examined the patient. I agree with the above.  Stump site looks excellent. Recommend occlusive dressing daily  to prevent soiling while in bed.  Pt okay for discharge from a vascular surgery perspective.   Cassandria Santee, MD [facility] Phone Number: [phone] 08/03/2021 3:59 PM

## 2021-08-03 NOTE — Progress Notes (Signed)
PROGRESS NOTE    Todd Dunn  HQR:[id number] DOB: 06-16-1940 DOA: 07/29/2021 PCP: [name], NP     Brief Narrative:  Todd Dunn is a 81 y.o. male with medical history significant for coronary artery disease, CVA, BPH, GERD, hypertension, SSS, s/p PE AICD neurogenic bladder, who presented to the ER with acute onset of right second toe worsening pain with associated right foot pain.  The patient had a right leg bypass surgery in October and right great toe injury at that time when he had ray resection.  He hit his right second toe on the shower door and it is now with black discoloration. He was just seen by vascular surgery yesterday, thought to have an occlusion at the level of his knee and failed bypass.  Patient was admitted to the hospital and vascular surgery was consulted.  Patient ultimately underwent right AKA on 07/30/2021.  New events last 24 hours / Subjective: Patient underwent suprapubic catheter placement by IR today.  No new complaints voiced.  PT OT evaluated patient today and has recommended SNF placement.  Assessment & Plan:   Principal Problem:   Ischemic toe   Ischemic right foot -Status post right AKA -Per vascular surgery -PT OT recommending SNF placement  Hypertension -Norvasc  UTI -Keflex 2/23>>   Chronic urinary retention -Status post suprapubic catheter placement 2/27 -Follow-up with urology -Proscar  Dyslipidemia -Crestor  GERD -PPI    DVT prophylaxis:  enoxaparin (LOVENOX) injection 40 mg Start: 07/30/21 2200  Code Status: Full code Family Communication: No family at bedside Disposition Plan:  Status is: Inpatient Remains inpatient appropriate because: Awaiting SNF placement      Antimicrobials:  Anti-infectives (From admission, onward)    Start     Dose/Rate Route Frequency Ordered Stop   07/30/21 1000  cephALEXin (KEFLEX) capsule 500 mg        500 mg Oral 3 times daily 07/30/21 0036           Objective: Vitals:   08/03/21 0855 08/03/21 0910 08/03/21 0923 08/03/21 1140  BP: 132/74 (!) 121/96 115/67 (!) 148/80  Pulse: 70 71 70 71  Resp: 17 17 20 18   Temp:   97.9 F (36.6 C) 98 F (36.7 C)  TempSrc:   Axillary Oral  SpO2: 100% 100% 100% 95%  Weight:      Height:        Intake/Output Summary (Last 24 hours) at 08/03/2021 1449 Last data filed at 08/03/2021 1406 Gross per 24 hour  Intake 480 ml  Output 3575 ml  Net -3095 ml   Filed Weights   07/29/21 2126  Weight: 59 kg    Examination:  General exam: Appears calm and comfortable  Respiratory system: Clear to auscultation. Respiratory effort normal. No respiratory distress. No conversational dyspnea.  Cardiovascular system: S1 & S2 heard, RRR. No murmurs. No pedal edema. Gastrointestinal system: Abdomen is nondistended, soft and nontender. Normal bowel sounds heard. Central nervous system: Alert  Extremities: Right AKA with clean and dry incision, staples intact  Data Reviewed: I have personally reviewed following labs and imaging studies  CBC: Recent Labs  Lab 07/29/21 2104 07/30/21 0533  WBC 5.8 6.9  HGB 11.4* 11.1*  HCT 39.5 36.7*  MCV 86.1 83.8  PLT 189 982   Basic Metabolic Panel: Recent Labs  Lab 07/29/21 2325 07/30/21 0533  NA 140 140  K 3.6 3.8  CL 103 106  CO2 31 26  GLUCOSE 129* 121*  BUN 19 14  CREATININE 1.02 0.72  CALCIUM 9.7 9.5   GFR: Estimated Creatinine Clearance: 59.4 mL/min (by C-G formula based on SCr of 0.72 mg/dL). Liver Function Tests: Recent Labs  Lab 07/29/21 2325  AST 14*  ALT 12  ALKPHOS 139*  BILITOT 0.8  PROT 6.1*  ALBUMIN 2.9*   No results for input(s): LIPASE, AMYLASE in the last 168 hours. No results for input(s): AMMONIA in the last 168 hours. Coagulation Profile: Recent Labs  Lab 07/29/21 2325  INR 1.1   Cardiac Enzymes: Recent Labs  Lab 07/29/21 2325  CKTOTAL 44*   BNP (last 3 results) No results for input(s): PROBNP in the last  8760 hours. HbA1C: No results for input(s): HGBA1C in the last 72 hours. CBG: No results for input(s): GLUCAP in the last 168 hours. Lipid Profile: No results for input(s): CHOL, HDL, LDLCALC, TRIG, CHOLHDL, LDLDIRECT in the last 72 hours. Thyroid Function Tests: No results for input(s): TSH, T4TOTAL, FREET4, T3FREE, THYROIDAB in the last 72 hours. Anemia Panel: No results for input(s): VITAMINB12, FOLATE, FERRITIN, TIBC, IRON, RETICCTPCT in the last 72 hours. Sepsis Labs: No results for input(s): PROCALCITON, LATICACIDVEN in the last 168 hours.  Recent Results (from the past 240 hour(s))  Resp Panel by RT-PCR (Flu A&B, Covid) Nasopharyngeal Swab     Status: None   Collection Time: 07/29/21 10:00 PM   Specimen: Nasopharyngeal Swab; Nasopharyngeal(NP) swabs in vial transport medium  Result Value Ref Range Status   SARS Coronavirus 2 by RT PCR NEGATIVE NEGATIVE Final    Comment: (NOTE) SARS-CoV-2 target nucleic acids are NOT DETECTED.  The SARS-CoV-2 RNA is generally detectable in upper respiratory specimens during the acute phase of infection. The lowest concentration of SARS-CoV-2 viral copies this assay can detect is 138 copies/mL. A negative result does not preclude SARS-Cov-2 infection and should not be used as the sole basis for treatment or other patient management decisions. A negative result may occur with  improper specimen collection/handling, submission of specimen other than nasopharyngeal swab, presence of viral mutation(s) within the areas targeted by this assay, and inadequate number of viral copies(<138 copies/mL). A negative result must be combined with clinical observations, patient history, and epidemiological information. The expected result is Negative.  Fact Sheet for Patients:  EntrepreneurPulse.com.au  Fact Sheet for Healthcare Providers:  IncredibleEmployment.be  This test is no t yet approved or cleared by the  [place] FDA and  has been authorized for detection and/or diagnosis of SARS-CoV-2 by FDA under an Emergency Use Authorization (EUA). This EUA will remain  in effect (meaning this test can be used) for the duration of the COVID-19 declaration under Section 564(b)(1) of the Act, 21 U.S.C.section 360bbb-3(b)(1), unless the authorization is terminated  or revoked sooner.       Influenza A by PCR NEGATIVE NEGATIVE Final   Influenza B by PCR NEGATIVE NEGATIVE Final    Comment: (NOTE) The Xpert Xpress SARS-CoV-2/FLU/RSV plus assay is intended as an aid in the diagnosis of influenza from Nasopharyngeal swab specimens and should not be used as a sole basis for treatment. Nasal washings and aspirates are unacceptable for Xpert Xpress SARS-CoV-2/FLU/RSV testing.  Fact Sheet for Patients: EntrepreneurPulse.com.au  Fact Sheet for Healthcare Providers: IncredibleEmployment.be  This test is not yet approved or cleared by the [place] FDA and has been authorized for detection and/or diagnosis of SARS-CoV-2 by FDA under an Emergency Use Authorization (EUA). This EUA will remain in effect (meaning this test can be used) for the duration of the  COVID-19 declaration under Section 564(b)(1) of the Act, 21 U.S.C. section 360bbb-3(b)(1), unless the authorization is terminated or revoked.  Performed at [facility], [place] [address], [place], [place] [address]   Surgical PCR screen     Status: None   Collection Time: 07/31/21  1:07 AM   Specimen: Nasal Mucosa; Nasal Swab  Result Value Ref Range Status   MRSA, PCR NEGATIVE NEGATIVE Final   Staphylococcus aureus NEGATIVE NEGATIVE Final    Comment: (NOTE) The Xpert SA Assay (FDA approved for NASAL specimens in patients 64 years of age and older), is one component of a comprehensive surveillance program. It is not intended to diagnose infection nor to guide or monitor treatment. Performed at [facility], [place] [address], [place], [place] [address]       Radiology Studies: No results found.    Scheduled Meds:  amLODipine  10 mg Oral Daily   cephALEXin  500 mg Oral TID   Chlorhexidine Gluconate Cloth  6 each Topical Daily   divalproex  125 mg Oral QHS   enoxaparin (LOVENOX) injection  40 mg Subcutaneous Q24H   feeding supplement  237 mL Oral BID BM   finasteride  5 mg Oral q morning   multivitamin with minerals  1 tablet Oral Daily   nutrition supplement (JUVEN)  1 packet Oral BID WC   pantoprazole  40 mg Oral Daily   rosuvastatin  10 mg Oral q morning   sodium chloride flush  10 mL Intracatheter Q12H   vitamin B-12  1,000 mcg Oral Daily   Vitamin D (Ergocalciferol)  50,000 Units Oral Q Mon   Continuous Infusions:  0.9 % NaCl with KCl 20 mEq / L 100 mL/hr at 08/03/21 1142     LOS: 4 days     Dessa Phi, DO [facility] 08/03/2021, 2:49 PM   Available via Epic secure chat 7am-7pm After these hours, please refer to coverage provider listed on [email or web address]

## 2021-08-03 NOTE — Evaluation (Addendum)
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 s/p suprapubic drainage catheter placement. PMH includes neurogenic bladder with chronic indwelling foley, PAD, CAD with CABG, CVA with left hemiplegia and cognitive impairment, SSS s/p ICD, HF, HTN.   Clinical Impression   PTA, pt was living with his granddaughter and requiring assistance for ADLs and using w/c for mobility; information from chart review as pt unable to provide. Pt currently requiring Max-Total A for ADLs due to cognitive deficits. Pt presenting with decreased balance, strength, cognition, and activity tolerance due to pain at R residual limb. Pt performing bed mobility with Min A but then requiring Max A for sitting balance due to pain. Pt would benefit from further acute OT to facilitate safe dc. Recommend dc to SNF for further OT to optimize safety, independence with ADLs, and return to PLOF.      Recommendations for follow up therapy are one component of a multi-disciplinary discharge planning process, led by the attending physician.  Recommendations may be updated based on patient status, additional functional criteria and insurance authorization.   Follow Up Recommendations  Skilled nursing-short term rehab (<3 hours/day)    Assistance Recommended at Discharge Frequent or constant Supervision/Assistance  Patient can return home with the following Two people to help with walking and/or transfers;Two people to help with bathing/dressing/bathroom    Functional Status Assessment  Patient has had a recent decline in their functional status and/or demonstrates limited ability to make significant improvements in function in a reasonable and predictable amount of time  Equipment Recommendations  Other (comment) (Defer  to next venue)    Recommendations for Other Services PT consult     Precautions / Restrictions Precautions Precautions: Fall;Other (comment) Precaution Comments: new AKA, indwelling foley catheter Restrictions Weight Bearing Restrictions: Yes RLE Weight Bearing: Non weight bearing      Mobility Bed Mobility Overal bed mobility: Needs Assistance Bed Mobility: Supine to Sit, Sit to Supine     Supine to sit: Min assist, +2 for safety/equipment, +2 for physical assistance, HOB elevated Sit to supine: Min assist, +2 for safety/equipment, +2 for physical assistance   General bed mobility comments: With [facility] raised upright, pt following cues to bring LLE over EOB and then elevating trunk with Min A. Pt then leaning back due to pain and requiring Max A for sitting balance. Pt then returning himself to supine with Min A for repositioning shoulders.    Transfers                   General transfer comment: Defer due to safety with cognition and arousal      Balance Overall balance assessment: Needs assistance Sitting-balance support: No upper extremity supported, Feet supported Sitting balance-Leahy Scale: Poor                                     ADL either performed or assessed with clinical judgement   ADL Overall ADL's : Needs assistance/impaired                                       General ADL Comments: Max-Total A for ADLs due cognitive deficits  Vision         Perception     Praxis      Pertinent Vitals/Pain Pain Assessment Pain Assessment: Faces Faces Pain Scale: Hurts even more Pain Location: RLE Pain Descriptors / Indicators: Grimacing, Guarding, Moaning Pain Intervention(s): Monitored during session, Limited activity within patient's tolerance, Repositioned     Hand Dominance Right   Extremity/Trunk Assessment Upper Extremity Assessment Upper Extremity Assessment: Defer to OT evaluation   Lower Extremity  Assessment Lower Extremity Assessment: LLE deficits/detail;RLE deficits/detail;Difficult to assess due to impaired cognition RLE Deficits / Details: No active movement noted in right residual limb, shaking head no to being able to move it. LLE Deficits / Details: Observed moving ankle, toes and flexing/extending knee       Communication Communication Communication: Expressive difficulties   Cognition Arousal/Alertness: Lethargic Behavior During Therapy: Flat affect Overall Cognitive Status: Difficult to assess                                 General Comments: Following ~10% of one-step commands with increased time and cues. no verbalization.     General Comments  VSS    Exercises     Shoulder Instructions      Home Living Family/patient expects to be discharged to:: Private residence Living Arrangements: Other relatives (granddaughter and great grandson) Available Help at Discharge: Family Type of Home: House Home Access: Stairs to enter Technical brewer of Steps: 2   Home Layout: One level     Bathroom Shower/Tub: Tub/shower unit;Curtain;Walk-in shower   Bathroom Toilet: Standard Bathroom Accessibility: Yes   Home Equipment: Conservation officer, nature (2 wheels);Wheelchair - manual;Grab bars - tub/shower   Additional Comments: information from recent admission      Prior Functioning/Environment Prior Level of Function : Needs assist;Patient poor historian/Family not available  Cognitive Assist : ADLs (cognitive);Mobility (cognitive)     Physical Assist : Mobility (physical);ADLs (physical) Mobility (physical): Bed mobility;Transfers ADLs (physical): Grooming;Bathing;Dressing;Toileting;IADLs Mobility Comments: reports uses wheelchair (manual); no longer walks; uses RW for transfer to wheelchair          OT Problem List: Decreased strength;Decreased range of motion;Decreased activity tolerance;Impaired balance (sitting and/or standing);Decreased  cognition;Decreased safety awareness;Decreased knowledge of use of DME or AE;Decreased knowledge of precautions      OT Treatment/Interventions: Self-care/ADL training;Energy conservation;DME and/or AE instruction;Therapeutic activities;Patient/family education    OT Goals(Current goals can be found in the care plan section) Acute Rehab OT Goals Patient Stated Goal: Unstated OT Goal Formulation: Patient unable to participate in goal setting Time For Goal Achievement: 08/17/21 Potential to Achieve Goals: Good ADL Goals Pt Will Perform Upper Body Bathing: with min assist;sitting Pt Will Perform Lower Body Bathing: with mod assist;sitting/lateral leans;bed level Pt Will Transfer to Toilet: with mod assist;with +2 assist;with transfer board;bedside commode Additional ADL Goal #1: Pt will follow one step commands 50% of time during ADLs with Mod cues Additional ADL Goal #2: Pt will perform bed mobility with Min A +2 in preparation for ADLs  OT Frequency: Min 2X/week    Co-evaluation PT/OT/SLP Co-Evaluation/Treatment: Yes Reason for Co-Treatment: For patient/therapist safety;To address functional/ADL transfers PT goals addressed during session: Mobility/safety with mobility;Balance;Strengthening/ROM OT goals addressed during session: Proper use of Adaptive equipment and DME      AM-PAC OT "6 Clicks" Daily Activity     Outcome Measure Help from another person eating meals?: A Lot Help from another person taking care of personal grooming?: A Lot  Help from another person toileting, which includes using toliet, bedpan, or urinal?: Total Help from another person bathing (including washing, rinsing, drying)?: Total Help from another person to put on and taking off regular upper body clothing?: A Lot Help from another person to put on and taking off regular lower body clothing?: Total 6 Click Score: 9   End of Session Nurse Communication: Mobility status  Activity Tolerance: Patient tolerated  treatment well Patient left: with call bell/phone within reach;in bed;with bed alarm set  OT Visit Diagnosis: Unsteadiness on feet (R26.81);Other abnormalities of gait and mobility (R26.89);Muscle weakness (generalized) (M62.81)                Time: 0165-5374 OT Time Calculation (min): 17 min Charges:  OT General Charges $OT Visit: 1 Visit OT Evaluation $OT Eval Moderate Complexity: [place], OTR/L Acute Rehab Pager: [phone] Office: [place] 08/03/2021, 12:57 PM

## 2021-08-03 NOTE — Progress Notes (Signed)
Orthopedic Tech Progress Note Patient Details:  Todd Dunn 06-Oct-1940 [id number]  Called in order to [employer] for a RETENTION SOCK for a AKA  Patient ID: [name], male   DOB: 07/10/1940, 81 y.o.   MRN: [id number]  Janit Pagan 08/03/2021, 9:27 AM

## 2021-08-03 NOTE — Care Management Important Message (Signed)
Important Message  Patient Details  Name: Todd Dunn MRN: [id number] Date of Birth: [birth year]   Medicare Important Message Given:  Yes     Shelda Altes 08/03/2021, 9:41 AM

## 2021-08-03 NOTE — Evaluation (Signed)
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 s/p suprapubic drainage catheter placement. PMH includes neurogenic bladder with chronic indwelling foley, PAD, CAD with CABG, CVA with left hemiplegia and cognitive impairment, SSS s/p ICD, HF, HTN.  Clinical Impression  Patient presents with pain, post surgical deficits s/p right AKA, weakness, impaired balance, decreased activity tolerance, baseline cognitive impairments from prior CVA and impaired mobility s/p above. Pt not able to provide history/PLOF due to cognitive/speech deficits. Per chart, as of 1 month ago, pt was mainly using w/c for mobility. Today, pt requires Min A of 2 for bed mobility, difficulty getting fully upright due to pain in right residual limb resulting in posterior lean and Max A. Able to use UEs to lift bottom and scoot in bed, assisting with repositioning. Would benefit from SNF to maximize independence and mobility prior to return home.        Recommendations for follow up therapy are one component of a multi-disciplinary discharge planning process, led by the attending physician.  Recommendations may be updated based on patient status, additional functional criteria and insurance authorization.  Follow Up Recommendations Skilled nursing-short term rehab (<3 hours/day)    Assistance Recommended at Discharge Frequent or constant Supervision/Assistance  Patient can return home with the following  Direct supervision/assist for financial management;Assist for transportation;Help with stairs or ramp for entrance;Direct supervision/assist for medications management;Assistance with cooking/housework;A lot of help with walking and/or transfers;A lot of help with bathing/dressing/bathroom    Equipment Recommendations  Other (comment) (defer)  Recommendations for Other Services       Functional Status Assessment Patient has had a recent decline in their functional status and demonstrates the ability to make significant improvements in function in a reasonable and predictable amount of time.     Precautions / Restrictions Precautions Precautions: Fall;Other (comment) Precaution Comments: new AKA, indwelling foley catheter Restrictions Weight Bearing Restrictions: Yes RLE Weight Bearing: Non weight bearing      Mobility  Bed Mobility Overal bed mobility: Needs Assistance Bed Mobility: Rolling, Supine to Sit, Sit to Supine Rolling: Min guard   Supine to sit: Min assist, +2 for safety/equipment Sit to supine: Min assist, +2 for safety/equipment   General bed mobility comments: Able to roll to left using rail to adjust pads without physical assist, just verbal cues; Min A of 2 to attempt elevating trunk and moving LLE to get to EOB to initiate, pt leaning posteriorly likely due to pain on right residual limb and needing Mod A for upright/balance. Able to get into bed with cues. Able to use UEs well enough to scoot bottom and lift off bed. Assisting to reposition.    Transfers                   General transfer comment: Deferred    Ambulation/Gait               General Gait Details: Deferred  Stairs            Wheelchair Mobility    Modified Rankin (Stroke Patients Only)       Balance Overall balance assessment: Needs assistance Sitting-balance support: Feet unsupported, Bilateral upper extremity supported Sitting balance-Leahy Scale: Poor Sitting balance - Comments: Resting on therapist when sitting up in bed, LLE not on floor as pt leaning posteriorly needing  Min-Mod A for support, BUEs needed as well. Postural control: Posterior lean                                   Pertinent Vitals/Pain Pain Assessment Pain Assessment: Faces Faces Pain Scale:  Hurts little more Pain Location: generalized with movement, grabbing right residual limb Pain Descriptors / Indicators: Grimacing, Guarding Pain Intervention(s): Monitored during session, Repositioned, Limited activity within patient's tolerance    Home Living Family/patient expects to be discharged to:: Private residence Living Arrangements: Other relatives (granddaughter and great grandson) Available Help at Discharge: Family Type of Home: House Home Access: Stairs to enter   Technical brewer of Steps: 2   Home Layout: One level Home Equipment: Conservation officer, nature (2 wheels);Wheelchair - manual;Grab bars - tub/shower Additional Comments: information from recent admission    Prior Function Prior Level of Function : Needs assist;Patient poor historian/Family not available  Cognitive Assist : ADLs (cognitive);Mobility (cognitive)     Physical Assist : Mobility (physical);ADLs (physical) Mobility (physical): Bed mobility;Transfers ADLs (physical): Grooming;Bathing;Dressing;Toileting;IADLs Mobility Comments: reports uses wheelchair (manual); no longer walks; uses RW for transfer to wheelchair       Hand Dominance   Dominant Hand: Right    Extremity/Trunk Assessment   Upper Extremity Assessment Upper Extremity Assessment: Defer to OT evaluation    Lower Extremity Assessment Lower Extremity Assessment: LLE deficits/detail;RLE deficits/detail;Difficult to assess due to impaired cognition RLE Deficits / Details: No active movement noted in right residual limb, shaking head no to being able to move it. LLE Deficits / Details: Observed moving ankle, toes and flexing/extending knee       Communication   Communication: Expressive difficulties  Cognition Arousal/Alertness: Lethargic Behavior During Therapy: Flat affect Overall Cognitive Status: No family/caregiver present to determine baseline cognitive functioning                                 General  Comments: Pt following a few commands, roughly ~25% of the time with repetition. Smiling at end when told next session we would try to get OOB. Tracking to both therapists and reaching with hands; trying to verbalize but not no intelligible. Lethargic and eyes closed for half of session, returned from procedure earlier in AM.        General Comments General comments (skin integrity, edema, etc.): VSS    Exercises     Assessment/Plan    PT Assessment Patient needs continued PT services  PT Problem List Decreased strength;Decreased mobility;Decreased knowledge of precautions;Decreased range of motion;Decreased cognition;Decreased skin integrity;Pain;Decreased balance       PT Treatment Interventions Therapeutic exercise;Therapeutic activities;Patient/family education;Functional mobility training;Cognitive remediation;Manual techniques;Wheelchair mobility training;Balance training;DME instruction;Gait training    PT Goals (Current goals can be found in the Care Plan section)  Acute Rehab PT Goals Patient Stated Goal: none stated PT Goal Formulation: Patient unable to participate in goal setting Time For Goal Achievement: 08/17/21 Potential to Achieve Goals: Fair    Frequency Min 3X/week     Co-evaluation PT/OT/SLP Co-Evaluation/Treatment: Yes Reason for Co-Treatment: For patient/therapist safety;To address functional/ADL transfers PT goals addressed during session: Mobility/safety with mobility;Balance;Strengthening/ROM OT goals addressed during session: Proper use of Adaptive equipment and DME       AM-PAC PT "6 Clicks" Mobility  Outcome Measure Help needed turning from your back to your side while in a flat bed without using bedrails?:  A Little Help needed moving from lying on your back to sitting on the side of a flat bed without using bedrails?: A Little Help needed moving to and from a bed to a chair (including a wheelchair)?: Total Help needed standing up from a chair  using your arms (e.g., wheelchair or bedside chair)?: Total Help needed to walk in hospital room?: Total Help needed climbing 3-5 steps with a railing? : Total 6 Click Score: 10    End of Session   Activity Tolerance: Patient limited by pain;Patient limited by lethargy Patient left: in bed;with call bell/phone within reach;with bed alarm set Nurse Communication: Mobility status PT Visit Diagnosis: Pain;Muscle weakness (generalized) (M62.81) Pain - Right/Left: Right Pain - part of body: Leg    Time: 6962-9528 PT Time Calculation (min) (ACUTE ONLY): 17 min   Charges:   PT Evaluation $PT Eval Moderate Complexity: 1 Mod          Marisa Severin, PT, DPT Acute Rehabilitation Services Pager [phone] Office [phone]     Todd Dunn 08/03/2021, 12:55 PM

## 2021-08-04 LAB — RESP PANEL BY RT-PCR (FLU A&B, COVID) ARPGX2
Influenza A by PCR: NEGATIVE
Influenza B by PCR: NEGATIVE
SARS Coronavirus 2 by RT PCR: NEGATIVE

## 2021-08-04 MED ORDER — HYDROCODONE-ACETAMINOPHEN 5-325 MG PO TABS: 1.0000 | ORAL_TABLET | Freq: Four times a day (QID) | ORAL | 0 refills | Status: DC | PRN

## 2021-08-04 NOTE — TOC Initial Note (Addendum)
Transition of Care [facility]) - Initial/Assessment Note    Patient Details  Name: Todd Dunn MRN: [id number] Date of Birth: 14-Dec-1940  Transition of [facility]) CM/SW Contact:    Benard Halsted, LCSW Phone Number: 08/04/2021, 9:20 AM  Clinical Narrative:                 9:20am-CSW received consult for possible SNF placement at time of discharge. CSW spoke with patient's granddaughter, [name]. She reported that family is currently unable to care for patient at their home given patients current physical needs and fall risk. She expressed understanding of PT recommendation and is agreeable to SNF placement at time of discharge. She reports preference for [facility] as patient was there in November and they really liked the care. CSW discussed insurance authorization process and will provide Medicare SNF ratings list. Patient has received COVID vaccines. [facility] reviewing referral.   12pm-[facility] able to [facility] patient. CSW submitted clinicals to insurance for review, Ref# [id number]. Requested COVID test. Updated patient's daughter.  4:13pm-Insurance approval still pending. [place] and MD aware.   Skilled Nursing Rehab Facilities-   [email or web address]   Ratings out of 5 possible   Name Address  Phone # [place] Inspection Overall  [facility] [address], [place] 5 5 2 4   [facility]  [address] [place], [place] [phone] 4 2 5 5   [facility] [place], [place] 4 1 1 1   [place], [place] 2 2 4 4   [facility] [address] [place] 2 1 2 1   [place] 3 1 4 3   [facility] [address], [place] 5 2 2 3   [facility] [address] [place] 4 1 2 1   [address] ([place]) [place],  [place] [phone] 5 1 2 2   [facility] [address], [place] [phone] 4 1 1 1   [facility] [address], [facility] [phone] 4 1 2 1   [facility] ([place]) [place]. [place], [place] [phone] 4 1 1 1           [place] [address], [place] 4 2 3 3   [facility] [place] [address] [place] 3 1 5 4   [facility], [place], [address] [phone] 2 1 1 1   [facility] [address] [phone] 2 2 3 3           [address] (no [facility]) [place] [address], [place] [phone] 4 5 5 5   [facility] (No Humana) [address] [phone] [address], [place] 4 1 4 3   [facility] (No UHC) [place], [place] [phone] 5 5 5 5   [facility] [address], [place] [phone] 3 3 4 4   [place] [address], [place]  [phone] 2 2 2 2   [phone] [address] [place] 3 1 3 2   [place] [address] [place] 1 1 2 1   [place] [address], [address] [place] 2 1 1 1   [place], [place] 5 2 4 5   [facility] [address] [place] 2 1 1 1   [facility] [address], [place] 3 1  [place] 2 1 2 1           [facility] [place] [address], [place] [phone] 5 3 3 4   [place] [address] [place] 2 1 1 1   [place] (No Humana) [address], [place] [phone] 2 1 2 1   [facility] [address], [place] [phone] 3 1 1 1           [facility] [place], [place] 4 1 5 4   [facility]) [address], [place] 2  1 3 2   [facility]) [place], [place] 3 1 4 3   [place] [address] [place] 4 1 4 3   [address] [place], [place] 3 3 1 1   [facility]) [name] [place] [phone] 3 2 3 3      Expected Discharge Plan: [place] Barriers to Discharge: Insurance Authorization, Continued Medical Work up   Patient Goals and CMS Choice Patient states their goals for this hospitalization and ongoing recovery are:: Rehab CMS Medicare.gov Compare Post Acute Care list provided to:: Patient Represenative (must comment) Choice offered to / list presented to :  (Granddaughter)  Expected Discharge Plan and Services Expected Discharge Plan: [place] In-house Referral: Clinical Social Work   Post Acute Care Choice: [place] Living arrangements for the past 2 months: [place]                                      Prior Living Arrangements/Services Living arrangements for the past 2 months: Single Family Home Lives with:: Relatives Patient language and need for interpreter reviewed:: Yes Do you feel safe going back to the place where you live?: Yes      Need for Family Participation in Patient Care: Yes (Comment) Care giver support system in place?: Yes (comment) Current home services: DME Criminal Activity/Legal Involvement Pertinent to Current Situation/Hospitalization: No - Comment as needed  Activities of Daily Living      Permission Sought/Granted Permission sought to share information with : Facility Sport and exercise psychologist, Family Supports Permission granted to share information with : Yes, Verbal Permission Granted  Share Information with NAME: [name]  Permission granted to share info w AGENCY: SNFs  Permission granted to share info w Relationship: Granddaughter  Permission granted to share info w  Contact Information: [phone]  Emotional Assessment Appearance:: Appears stated age Attitude/Demeanor/Rapport: Unable to Assess Affect (typically observed): Unable to Assess Orientation: : Oriented to Self Alcohol / Substance Use: Not Applicable Psych Involvement: No (comment)  Admission diagnosis:  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] Critical limb ischemia of right lower extremity ([place]) [I70.221] Patient Active Problem List   Diagnosis Date Noted   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/2021   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   CAP (community acquired pneumonia) 06/21/2021   Severe sepsis ([place]) 06/21/2021   SOB (shortness of breath) 06/21/2021   GERD (gastroesophageal reflux disease)    AMS (altered mental status) 05/11/2021   Hypotension 05/11/2021   Iron deficiency anemia 05/07/2021   Oral phase dysphagia 04/28/2021   Protein-calorie malnutrition, severe 03/27/2021   Toe infection    PVD (peripheral vascular disease) ([place])    Osteomyelitis of great toe of right  foot ([place]) 03/20/2021   Wound infection 03/20/2021   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 coli infection 12/15/2020   BPH (benign prostatic hyperplasia) 06/09/7251   Chronic systolic heart failure ([place]) 12/12/2019   PCP:  [name], NP Pharmacy:   [facility] [place], [place] [address] [place] [address] Phone: [phone] Fax: [phone]  [facility] [address] [place] [address] Phone: [phone]  Fax: [phone]     Social Determinants of Health (SDOH) Interventions    Readmission Risk Interventions Readmission Risk Prevention Plan 08/04/2021 06/24/2021 02/17/2021  Transportation Screening Complete Complete Complete  Medication Review Press photographer) Complete Complete Complete  PCP or Specialist appointment within 3-5 days of discharge Complete Complete Complete  HRI or Home Care Consult Complete Complete Complete  SW Recovery Care/Counseling Consult Complete - Complete  Palliative Care Screening Not Applicable Not Applicable Not [place] Complete Not Applicable Not Applicable

## 2021-08-04 NOTE — Progress Notes (Signed)
°  Progress Note    08/04/2021 7:42 AM 5 Days Post-Op  Subjective:  no changes overnight  Vitals:   08/04/21 0337 08/04/21 0740  BP: (!) 106/40 120/62  Pulse: 70 77  Resp: 18 17  Temp: 98 F (36.7 C) (!) 97.4 F (36.3 C)  SpO2: 95% 96%    Physical Exam: Incisions:  R AKA well appearing   CBC    Component Value Date/Time   WBC 6.9 07/30/2021 0533   RBC 4.38 07/30/2021 0533   HGB 11.1 (L) 07/30/2021 0533   HGB 12.0 (L) 07/10/2021 1538   HCT 36.7 (L) 07/30/2021 0533   PLT 174 07/30/2021 0533   PLT 145 (L) 07/10/2021 1538   MCV 83.8 07/30/2021 0533   MCH 25.3 (L) 07/30/2021 0533   MCHC 30.2 07/30/2021 0533   RDW 22.4 (H) 07/30/2021 0533   LYMPHSABS 1.4 07/23/2021 1941   MONOABS 0.5 07/23/2021 1941   EOSABS 0.0 07/23/2021 1941   BASOSABS 0.0 07/23/2021 1941    BMET    Component Value Date/Time   NA 140 07/30/2021 0533   K 3.8 07/30/2021 0533   CL 106 07/30/2021 0533   CO2 26 07/30/2021 0533   GLUCOSE 121 (H) 07/30/2021 0533   BUN 14 07/30/2021 0533   CREATININE 0.72 07/30/2021 0533   CREATININE 0.87 07/10/2021 1538   CALCIUM 9.5 07/30/2021 0533   GFRNONAA >60 07/30/2021 0533   GFRNONAA >60 07/10/2021 1538    INR    Component Value Date/Time   INR 1.1 07/29/2021 2325     Intake/Output Summary (Last 24 hours) at 08/04/2021 0742 Last data filed at 08/04/2021 7124 Gross per 24 hour  Intake 350 ml  Output 1575 ml  Net -1225 ml     Assessment/Plan:  81 y.o. male is s/p right above knee amputation  5 Days Post-Op  - Incision seems to be healing well -Dry dressing changes daily - Ok for SNF from vascular standpoint; please call with questions   Dagoberto Ligas, PA-C [facility] [phone] 08/04/2021 7:42 AM

## 2021-08-04 NOTE — NC FL2 (Signed)
[place] LEVEL OF CARE SCREENING TOOL     IDENTIFICATION  Patient Name: Todd Dunn Birthdate: 01-11-1941 Sex: male Admission Date (Current Location): 07/29/2021  [facility] [place] Number:  Herbalist and Address:  [facility] 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   CAP (community acquired pneumonia) 06/21/2021   Severe sepsis ([place]) 06/21/2021   SOB (shortness of breath) 06/21/2021   GERD (gastroesophageal reflux disease)    AMS (altered mental status) 05/11/2021   Hypotension 05/11/2021   Iron deficiency anemia 05/07/2021   Oral phase dysphagia 04/28/2021   Protein-calorie malnutrition, severe 03/27/2021   Toe infection    PVD (peripheral vascular disease) ([place])    Osteomyelitis of great toe of right foot ([place]) 03/20/2021   Wound infection 03/20/2021   Pacemaker 03/20/2021   Elevated alkaline phosphatase level 03/20/2021   Hyperlipidemia 03/20/2021   Hematuria 02/14/2021   Urinary tract infection associated with indwelling urethral catheter ([place])    Normocytic anemia 01/06/2021   Catheter-associated urinary tract infection ([place]) 70/96/2836   Toxic metabolic encephalopathy 62/94/7654   Chronic indwelling Foley catheter 12/15/2020   HTN (hypertension) 12/15/2020   History of ESBL E. coli infection 12/15/2020   BPH (benign prostatic hyperplasia) 65/08/5463   Chronic systolic heart failure ([place]) 12/12/2019    Orientation RESPIRATION BLADDER Height &  Weight     Self  Normal Incontinent Weight: 130 lb (59 kg) Height:  6' (182.9 cm)  BEHAVIORAL SYMPTOMS/MOOD NEUROLOGICAL BOWEL NUTRITION STATUS      Continent Diet (See dc summary)  AMBULATORY STATUS COMMUNICATION OF NEEDS Skin   Extensive Assist Verbally Surgical wounds (Closed incision on leg)                       Personal Care Assistance Level of Assistance  Bathing, Feeding, Dressing Bathing Assistance: Maximum assistance Feeding assistance: Limited assistance Dressing Assistance: Limited assistance     Functional Limitations Info             SPECIAL CARE FACTORS FREQUENCY  PT (By licensed PT), OT (By licensed OT)     PT Frequency: 5x/week OT Frequency: 5x/week            Contractures Contractures Info: Not present    Additional Factors Info  Code Status, Allergies, Psychotropic, Isolation Precautions Code Status Info: Full Allergies Info: Food, Aspirin, [place] Clam (M. [place]) Skin Test Psychotropic Info: Depakote   Isolation Precautions Info: ESBL; VRE     Current Medications (08/04/2021):  This is the current hospital active medication list Current Facility-Administered Medications  Medication Dose Route Frequency Provider Last Rate Last Admin   acetaminophen (TYLENOL) tablet 650 mg  650 mg Oral Q6H PRN Baglia, Corrina, PA-C       Or   acetaminophen (TYLENOL) suppository 650 mg  650 mg Rectal Q6H PRN Baglia, Corrina, PA-C       albuterol (PROVENTIL) (2.5 MG/3ML) 0.083% nebulizer solution 3 mL  3  mL Inhalation Q6H PRN Baglia, Corrina, PA-C       amLODipine (NORVASC) tablet 10 mg  10 mg Oral Daily Baglia, Corrina, PA-C   10 mg at 08/04/21 0918   cephALEXin (KEFLEX) capsule 500 mg  500 mg Oral TID Baglia, Corrina, PA-C   500 mg at 08/04/21 5573   Chlorhexidine Gluconate Cloth 2 % PADS 6 each  6 each Topical Daily Fuller Plan A, MD   6 each at 08/03/21 1539   divalproex (DEPAKOTE) DR tablet 125 mg  125 mg Oral QHS Baglia, Corrina, PA-C    125 mg at 08/03/21 2127   enoxaparin (LOVENOX) injection 40 mg  40 mg Subcutaneous Q24H Baglia, Corrina, PA-C   40 mg at 08/03/21 2127   feeding supplement (ENSURE ENLIVE / ENSURE PLUS) liquid 237 mL  237 mL Oral BID BM Derrill Kay A, MD   237 mL at 08/04/21 0913   finasteride (PROSCAR) tablet 5 mg  5 mg Oral q morning Baglia, Corrina, PA-C   5 mg at 08/04/21 2202   HYDROcodone-acetaminophen (NORCO/VICODIN) 5-325 MG per tablet 1 tablet  1 tablet Oral Q6H PRN Baglia, Corrina, PA-C   1 tablet at 08/04/21 0914   iohexol (OMNIPAQUE) 300 MG/ML solution 100 mL  100 mL Per Tube Once PRN Mugweru, Wille Glaser, MD       magnesium hydroxide (MILK OF MAGNESIA) suspension 30 mL  30 mL Oral Daily PRN Baglia, Corrina, PA-C       morphine (PF) 2 MG/ML injection 2 mg  2 mg Intravenous Q4H PRN Baglia, Corrina, PA-C   2 mg at 08/04/21 5427   multivitamin with minerals tablet 1 tablet  1 tablet Oral Daily Phillips Grout, MD   1 tablet at 08/04/21 0623   nitroGLYCERIN (NITROSTAT) SL tablet 0.4 mg  0.4 mg Sublingual Q5 Min x 3 PRN Baglia, Corrina, PA-C       nutrition supplement (JUVEN) (JUVEN) powder packet 1 packet  1 packet Oral BID WC Phillips Grout, MD   1 packet at 08/04/21 0913   ondansetron (ZOFRAN) tablet 4 mg  4 mg Oral Q6H PRN Baglia, Corrina, PA-C       Or   ondansetron (ZOFRAN) injection 4 mg  4 mg Intravenous Q6H PRN Baglia, Corrina, PA-C       pantoprazole (PROTONIX) EC tablet 40 mg  40 mg Oral Daily Baglia, Corrina, PA-C   40 mg at 08/04/21 0914   polyethylene glycol (MIRALAX / GLYCOLAX) packet 17 g  17 g Oral Daily PRN Baglia, Corrina, PA-C       rosuvastatin (CRESTOR) tablet 10 mg  10 mg Oral q morning Baglia, Corrina, PA-C   10 mg at 08/03/21 1012   sodium chloride flush (NS) 0.9 % injection 10 mL  10 mL Intracatheter Q12H Mugweru, Jon, MD   10 mL at 08/03/21 2135   traZODone (DESYREL) tablet 25 mg  25 mg Oral QHS PRN Baglia, Corrina, PA-C   25 mg at 08/03/21 2358   vitamin B-12 (CYANOCOBALAMIN) tablet  1,000 mcg  1,000 mcg Oral Daily Baglia, Corrina, PA-C   1,000 mcg at 08/04/21 7628   Vitamin D (Ergocalciferol) (DRISDOL) capsule 50,000 Units  50,000 Units Oral Q Mon Baglia, Corrina, PA-C   50,000 Units at 08/03/21 3151     Discharge Medications: Please see discharge summary for a list of discharge medications.  Relevant Imaging Results:  Relevant Lab Results:   Additional Information [id number]. Had Both [place], LCSW

## 2021-08-04 NOTE — Discharge Summary (Signed)
Physician Discharge Summary   Patient: Todd Dunn MRN: [id number] DOB: 06-01-1941  Admit date:     07/29/2021  Discharge date: 08/04/21  Discharge Physician: Dessa Phi   PCP: [name], NP   Recommendations at discharge:   Follow-up with vascular surgery Follow-up with urology  Discharge Diagnoses: Principal Problem:   Ischemic toe Active Problems:   HTN (hypertension)   Pacemaker   Hyperlipidemia   BPH (benign prostatic hyperplasia)   GERD (gastroesophageal reflux disease)  Resolved Problems:   * No resolved hospital problems. *   Hospital Course: [name] is a 81 y.o. male with medical history significant for coronary artery disease, CVA, BPH, GERD, hypertension, SSS, s/p PE AICD neurogenic bladder, who presented to the ER with acute onset of right second toe worsening pain with associated right foot pain.  The patient had a right leg bypass surgery in October and right great toe injury at that time when he had ray resection.  He hit his right second toe on the shower door and it is now with black discoloration. He was just seen by vascular surgery yesterday, thought to have an occlusion at the level of his knee and failed bypass.  Patient was admitted to the hospital and vascular surgery was consulted.  Patient ultimately underwent right AKA on 07/30/2021.  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 -Per vascular surgery -Plavix  -PT OT recommending SNF placement  Hypertension -Norvasc  UTI -Keflex 2/21>> for 10 days   Chronic urinary retention -Status post suprapubic catheter placement 2/27 -Follow-up with urology -Proscar  Dyslipidemia -Crestor  GERD -PPI        Consultants: Vascular surgery, urology, IR Procedures performed: Right AKA, suprapubic catheter placement Disposition: Skilled nursing facility Diet recommendation:  Cardiac diet  DISCHARGE MEDICATION: Allergies as of 08/04/2021        Reactions   Food Anaphylaxis, Rash   Clams   Aspirin Itching   [employer] (m. Mercenaria) Skin Test Itching        Medication List     STOP taking these medications    acetaminophen 325 MG tablet Commonly known as: TYLENOL       TAKE these medications    albuterol 108 (90 Base) MCG/ACT inhaler Commonly known as: VENTOLIN HFA Inhale 2 puffs into the lungs every 6 (six) hours as needed for wheezing or shortness of breath.   amLODipine 10 MG tablet Commonly known as: NORVASC Take 1 tablet (10 mg total) by mouth daily.   cephALEXin 500 MG capsule Commonly known as: KEFLEX Take 1 capsule (500 mg total) by mouth 3 (three) times daily.   clopidogrel 75 MG tablet Commonly known as: PLAVIX Take 1 tablet (75 mg total) by mouth at bedtime.   divalproex 125 MG DR tablet Commonly known as: DEPAKOTE Take 1 tablet (125 mg total) by mouth at bedtime.   finasteride 5 MG tablet Commonly known as: PROSCAR Take 1 tablet (5 mg total) by mouth every morning.   HYDROcodone-acetaminophen 5-325 MG tablet Commonly known as: Norco Take 1 tablet by mouth every 6 (six) hours as needed for moderate pain.   nitroGLYCERIN 0.4 MG SL tablet Commonly known as: NITROSTAT Place 0.4 mg under the tongue every 5 (five) minutes x 3 doses as needed for chest pain.   pantoprazole 40 MG tablet Commonly known as: PROTONIX TAKE 1 TABLET(40 MG) BY MOUTH DAILY What changed: See the new instructions.   polyethylene glycol 17 g packet Commonly  known as: MIRALAX / GLYCOLAX Take 17 g by mouth daily as needed for mild constipation.   rosuvastatin 10 MG tablet Commonly known as: CRESTOR Take 1 tablet (10 mg total) by mouth every morning.   vitamin B-12 1000 MCG tablet Commonly known as: CYANOCOBALAMIN Take 1 tablet (1,000 mcg total) by mouth daily.   Vitamin D (Ergocalciferol) 1.25 MG (50000 UNIT) Caps capsule Commonly known as: DRISDOL Take 50,000 Units by mouth every Monday.         Follow-up Information     Broadus John, MD Follow up.   Specialty: Vascular Surgery Contact information: [place] [address] [phone]         Ardis Hughs, MD Follow up.   Specialty: Urology Contact information: [place] [address] [phone]                 Discharge Exam: [name] Weights   07/29/21 2126  Weight: 59 kg   Examination: General exam: Appears calm  Respiratory system: Clear to auscultation. Respiratory effort normal. Cardiovascular system: S1 & S2 heard, RRR. No pedal edema. Gastrointestinal system: Abdomen is nondistended, soft Central nervous system: Alert  Extremities: Right AKA stump with sock, clean and dry  Condition at discharge: stable  The results of significant diagnostics from this hospitalization (including imaging, microbiology, ancillary and laboratory) are listed below for reference.   Imaging Studies: IR US Guide Bx Asp/Drain  Result Date: 08/03/2021 INDICATION: Chronic indwelling Foley catheter.  BPH. EXAM: ULTRASOUND GUIDED SUPRAPUBIC CATHETER PLACEMENT COMPARISON:  CT AP, 01/21/2021. MEDICATIONS: Patient was on scheduled antibiotics. ANESTHESIA/SEDATION: Moderate (conscious) sedation was employed during this procedure. A total of Versed 1 mg and Fentanyl 25 mcg was administered intravenously. Moderate Sedation Time: 10 minutes. The patient's level of consciousness and vital signs were monitored continuously by radiology nursing throughout the procedure under my direct supervision. CONTRAST:  None FLUOROSCOPIC DOSE: 0 minutes 6 seconds.  0.5 mGy COMPLICATIONS: None immediate. PROCEDURE: The procedure, risks, benefits, and alternatives were explained to the the patient and/or patient's representative . Questions regarding the procedure were encouraged and answered. The patient understands and consents to the procedure. A timeout was performed prior to the initiation of the procedure. The  patient was positioned supine on the CT gantry. Preprocedural imaging was obtained of the lower pelvis. The skin overlying the anterior aspect the lower pelvis was prepped and draped in usual sterile fashion. The patient's the urinary bladder was then distended with the administration of approximately 150 cc of saline via the existing Foley catheter. Under direct ultrasound guidance, a 22 gauge needle was utilized for the purposes of procedural planning after the overlying soft tissues were anesthetized with 1% lidocaine. Next, an 56 gauge trocar needle was advanced into the urinary bladder under direct ultrasound guidance. Ultrasound image was saved for procedural documentation purposes. A short Amplatz wire was coiled within the urinary bladder. The track was serially dilated ultimately allowing placement of a 14 Fr all-purpose drainage catheter with end coiled and locked within the urinary bladder. The catheter was connected to a gravity bag. The exit site of the catheter was secured within interrupted suture. A dressing was placed. The patient tolerated the procedure well without immediate postprocedural complication. FINDINGS: Preprocedural ultrasound imaging demonstrates decompression of the urinary bladder via a Foley catheter. There is no bowel interposed between the anterior aspect of the urinary bladder and the ventral lower abdominal/pelvic wall. After distension of the urinary bladder with the administration of  saline via the Foley catheter, ultrasound was utilized for the placement of a 14 Fr percutaneous drainage catheter with end coiled and locked within the urinary bladder. IMPRESSION: Successful ultrasound-guided placement of a 14 Fr suprapubic catheter, as above. PLAN: The patient will return to interventional radiology for fluoroscopic guided exchange and up sizing of this suprapubic drainage catheter to a 16 Fr Council tip Foley catheter in approximately 2 weeks. Michaelle Birks, MD [facility] Radiology Electronically Signed   By: Michaelle Birks M.D.   On: 08/03/2021 15:52   DG Foot Complete Right  Result Date: 07/29/2021 CLINICAL DATA:  Foot injury. Leg pain. Struck second toe in the shower, black in color. EXAM: RIGHT FOOT COMPLETE - 3+ VIEW COMPARISON:  None. FINDINGS: Prior resection of the first ray. Hammertoe deformity of the digits limits assessment. There is no evidence of acute fracture. Mild degenerative change of the midfoot. No erosion, bony destruction, or periosteal reaction. No soft tissue gas. No radiopaque foreign body IMPRESSION: Prior resection of the first ray. No acute fracture or subluxation. Electronically Signed   By: Keith Rake M.D.   On: 07/29/2021 22:05   VAS [place] ABI WITH/WO TBI  Result Date: 07/28/2021  LOWER EXTREMITY DOPPLER STUDY Patient Name:  FLEMON KELTY  Date of Exam:   07/28/2021 Medical Rec #: [id number]           Accession #:    [id number] Date of Birth: [birth year]           Patient Gender: M Patient Age:   70 years Exam Location:  [facility] Procedure:      VAS [place] ABI WITH/WO TBI Referring Phys: Risa Grill --------------------------------------------------------------------------------  Indications: Peripheral artery disease. High Risk         Hypertension, hyperlipidemia, past history of smoking, prior Factors:          CVA.  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 Right common                         femoral to posterior tibial artery bypass graft                         03/26/2021. Performing Technologist: Delorise Shiner RVT  Examination Guidelines: A complete evaluation includes at minimum, Doppler waveform signals and systolic blood pressure reading at the level of bilateral brachial, anterior tibial, and posterior tibial arteries, when vessel segments are accessible. Bilateral testing is considered an integral part of a complete examination. Photoelectric  Plethysmograph (PPG) waveforms and toe systolic pressure readings are included as required and additional duplex testing as needed. Limited examinations for reoccurring indications may be performed as noted.  ABI Findings: +---------+------------------+-----+-------------------+----------+  Right     Rt Pressure (mmHg) Index Waveform            Comment     +---------+------------------+-----+-------------------+----------+  Brachial  134                                                      +---------+------------------+-----+-------------------+----------+  PTA       30                 0.22  dampened monophasic             +---------+------------------+-----+-------------------+----------+  DP        0                  0.00  absent                          +---------+------------------+-----+-------------------+----------+  Great Toe                                              amputation  +---------+------------------+-----+-------------------+----------+ +---------+------------------+-----+-------------------+-------+  Left      Lt Pressure (mmHg) Index Waveform            Comment  +---------+------------------+-----+-------------------+-------+  Brachial  134                                                   +---------+------------------+-----+-------------------+-------+  PTA       54                 0.40  dampened monophasic          +---------+------------------+-----+-------------------+-------+  DP        36                 0.27  dampened monophasic          +---------+------------------+-----+-------------------+-------+  Great Toe 42                 0.31                               +---------+------------------+-----+-------------------+-------+ +-------+-----------+-----------+------------+------------+  ABI/TBI Today's ABI Today's TBI Previous ABI Previous TBI  +-------+-----------+-----------+------------+------------+  Right   0.22        amputation  1.09         amputation     +-------+-----------+-----------+------------+------------+  Left    0.40        0.31        0.74         0.59          +-------+-----------+-----------+------------+------------+ Bilateral ABIs appear decreased compared to prior study on 04/28/2021.  Summary: Right: Resting right ankle-brachial index indicates severe right lower extremity arterial disease. Left: Resting left ankle-brachial index indicates severe left lower extremity arterial disease. The left toe-brachial index is abnormal.  *See table(s) above for measurements and observations.  Electronically signed by Monica Martinez MD on 07/28/2021 at 11:53:59 AM.    Final    VAS [place] LOWER EXTREMITY BYPASS GRAFT DUPL  Result Date: 07/28/2021 LOWER EXTREMITY ARTERIAL DUPLEX STUDY Patient Name:  TRAVUS OREN  Date of Exam:   07/28/2021 Medical Rec #: [id number]           Accession #:    [id number] Date of Birth: Aug 28, 1940           Patient Gender: M Patient Age:   66 years Exam Location:  [facility] Procedure:      VAS [place] LOWER EXTREMITY BYPASS GRAFT DUPLEX Referring Phys: Risa Grill --------------------------------------------------------------------------------  Indications: Peripheral artery disease. High Risk         Hypertension, hyperlipidemia, past history of smoking, prior Factors:  CVA.  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 Right common                         femoral to posterior tibial artery bypass graft                         03/26/2021. Current ABI:            Right: 0.22, Left: 0.40 Performing Technologist: Delorise Shiner RVT  Examination Guidelines: A complete evaluation includes B-mode imaging, spectral Doppler, color Doppler, and power Doppler as needed of all accessible portions of each vessel. Bilateral testing is considered an integral part of a complete examination. Limited examinations for reoccurring indications may be performed as noted.   +----------+--------+-----+--------+--------+--------+  RIGHT      PSV cm/s Ratio Stenosis Waveform Comments  +----------+--------+-----+--------+--------+--------+  CFA Distal 64                      biphasic           +----------+--------+-----+--------+--------+--------+  Right Graft #1: Right fem-posterior tibial +------------------+--------+--------+--------+--------+                     PSV cm/s Stenosis Waveform Comments  +------------------+--------+--------+--------+--------+  Inflow             47                biphasic           +------------------+--------+--------+--------+--------+  Prox Anastomosis   73                biphasic           +------------------+--------+--------+--------+--------+  Proximal Graft     0                                    +------------------+--------+--------+--------+--------+  Mid Graft                                               +------------------+--------+--------+--------+--------+  Distal Graft       0                                    +------------------+--------+--------+--------+--------+  Distal Anastomosis 0                                    +------------------+--------+--------+--------+--------+  Outflow            0                                    +------------------+--------+--------+--------+--------+ Graft appears to be occluded/ thrombosed.   Summary: Right: Occluded right fem to posterior tibial artery bypass graft.  See table(s) above for measurements and observations. Electronically signed by Monica Martinez MD on 07/28/2021 at 11:54:16 AM.    Final     Microbiology: Results for orders placed or performed during the hospital encounter of 07/29/21  Resp Panel by RT-PCR (Flu A&B, Covid) Nasopharyngeal Swab  Status: None   Collection Time: 07/29/21 10:00 PM   Specimen: Nasopharyngeal Swab; Nasopharyngeal(NP) swabs in vial transport medium  Result Value Ref Range Status   SARS Coronavirus 2 by RT PCR NEGATIVE NEGATIVE Final    Comment:  (NOTE) SARS-CoV-2 target nucleic acids are NOT DETECTED.  The SARS-CoV-2 RNA is generally detectable in upper respiratory specimens during the acute phase of infection. The lowest concentration of SARS-CoV-2 viral copies this assay can detect is 138 copies/mL. A negative result does not preclude SARS-Cov-2 infection and should not be used as the sole basis for treatment or other patient management decisions. A negative result may occur with  improper specimen collection/handling, submission of specimen other than nasopharyngeal swab, presence of viral mutation(s) within the areas targeted by this assay, and inadequate number of viral copies(<138 copies/mL). A negative result must be combined with clinical observations, patient history, and epidemiological information. The expected result is Negative.  Fact Sheet for Patients:  EntrepreneurPulse.com.au  Fact Sheet for Healthcare Providers:  IncredibleEmployment.be  This test is no t yet approved or cleared by the [place] FDA and  has been authorized for detection and/or diagnosis of SARS-CoV-2 by FDA under an Emergency Use Authorization (EUA). This EUA will remain  in effect (meaning this test can be used) for the duration of the COVID-19 declaration under Section 564(b)(1) of the Act, 21 U.S.C.section 360bbb-3(b)(1), unless the authorization is terminated  or revoked sooner.       Influenza A by PCR NEGATIVE NEGATIVE Final   Influenza B by PCR NEGATIVE NEGATIVE Final    Comment: (NOTE) The Xpert Xpress SARS-CoV-2/FLU/RSV plus assay is intended as an aid in the diagnosis of influenza from Nasopharyngeal swab specimens and should not be used as a sole basis for treatment. Nasal washings and aspirates are unacceptable for Xpert Xpress SARS-CoV-2/FLU/RSV testing.  Fact Sheet for Patients: EntrepreneurPulse.com.au  Fact Sheet for Healthcare  Providers: IncredibleEmployment.be  This test is not yet approved or cleared by the [place] FDA and has been authorized for detection and/or diagnosis of SARS-CoV-2 by FDA under an Emergency Use Authorization (EUA). This EUA will remain in effect (meaning this test can be used) for the duration of the COVID-19 declaration under Section 564(b)(1) of the Act, 21 U.S.C. section 360bbb-3(b)(1), unless the authorization is terminated or revoked.  Performed at [facility], [place] [address], [place], [place] [address]   Surgical PCR screen     Status: None   Collection Time: 07/31/21  1:07 AM   Specimen: Nasal Mucosa; Nasal Swab  Result Value Ref Range Status   MRSA, PCR NEGATIVE NEGATIVE Final   Staphylococcus aureus NEGATIVE NEGATIVE Final    Comment: (NOTE) The Xpert SA Assay (FDA approved for NASAL specimens in patients 31 years of age and older), is one component of a comprehensive surveillance program. It is not intended to diagnose infection nor to guide or monitor treatment. Performed at [facility], [place] [address] [place], [place] [address]     Labs: CBC: Recent Labs  Lab 07/29/21 2104 07/30/21 0533  WBC 5.8 6.9  HGB 11.4* 11.1*  HCT 39.5 36.7*  MCV 86.1 83.8  PLT 189 563   Basic Metabolic Panel: Recent Labs  Lab 07/29/21 2325 07/30/21 0533  NA 140 140  K 3.6 3.8  CL 103 106  CO2 31 26  GLUCOSE 129* 121*  BUN 19 14  CREATININE 1.02 0.72  CALCIUM 9.7 9.5   Liver Function Tests: Recent Labs  Lab 07/29/21 2325  AST 14*  ALT 12  ALKPHOS 139*  BILITOT 0.8  PROT 6.1*  ALBUMIN 2.9*   CBG: No results for input(s): GLUCAP in the last 168 hours.  Discharge time spent: greater than 30 minutes.  Signed: Dessa Phi, DO [facility] 08/04/2021

## 2021-08-05 ENCOUNTER — Ambulatory Visit: Payer: Medicare Other | Admitting: Internal Medicine

## 2021-08-05 NOTE — Progress Notes (Signed)
Physical Therapy Treatment ?Patient Details ?Name: Todd Dunn ?MRN: [id number] ?DOB: 11-04-1940 ?Today's Date: 08/05/2021 ? ? 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 PMH includes neurogenic bladder with chronic indwelling foley, PAD, CAD with CABG, CVA with left hemiplegia and cognitive impairment, SSS s/p ICD, HF, HTN. ? ?  ?PT Comments  ? ? Pt received in supine, pleasantly confused and participatory with encouragement. Emphasis on safety with bed mobility, midline seated balance, supine BLE therapeutic exercises, safe positioning post-amputation, RLE precautions and benefits of mobility. Pt will need reinforcement for all instruction due to cognitive deficit. He performed bed mobility with min to modA and needs maxA for seated balance due to pain/posterior lean. Pt continues to benefit from PT services to progress toward functional mobility goals.    ?Recommendations for follow up therapy are one component of a multi-disciplinary discharge planning process, led by the attending physician.  Recommendations may be updated based on patient status, additional functional criteria and insurance authorization. ? ?Follow Up Recommendations ? Skilled nursing-short term rehab (<3 hours/day) ?  ?  ?Assistance Recommended at Discharge Frequent or constant Supervision/Assistance  ?Patient can return home with the following Direct supervision/assist for financial management;Assist for transportation;Help with stairs or ramp for entrance;Direct supervision/assist for medications management;Assistance with cooking/housework;A lot of help with bathing/dressing/bathroom;Two people to help with walking and/or transfers;Assistance with feeding (may need feeding assist intermittently due to cognition) ?  ?Equipment Recommendations ? Other (comment) (defer to post-acute)  ?  ?Recommendations for Other Services   ? ? ?  ?Precautions /  Restrictions Precautions ?Precautions: Fall;Other (comment) ?Precaution Comments: new R AKA, suprapubic catheter, dementia/residual L hemi ?Required Braces or Orthoses: Other Brace (shrinker sock for R residual limb -noted to be doffed, replaced at beginning of session) ?Restrictions ?Weight Bearing Restrictions: Yes ?RLE Weight Bearing: Non weight bearing  ?  ? ?Mobility ? Bed Mobility ?Overal bed mobility: Needs Assistance ?Bed Mobility: Sit to Supine, Rolling, Sidelying to Sit ?Rolling: Min guard ?Sidelying to sit: Mod assist, HOB elevated ?  ?Sit to supine: Min assist, Mod assist, HOB elevated ?  ?General bed mobility comments: With [facility] raised 30*, pt following cues to bring LLE over EOB and then elevating trunk with ModA. Pt with poor utilization of bed features and needs increased assist due to cognition, restlessness and difficulty sequencing. Pt impulsively leaning back due to RLE and groin pain and requiring Max A for sitting balance and bed rail support. Pt then returning himself to supine with Min A for safe repositioning; x2 trials but pain limting seated tolerance to ~30 seconds only ?  ? ?Transfers ?  ?  ?  ?  ?  ?General transfer comment: Defer due to safety with cognition and pain while seated ?  ? ?  ? ? ? ?  ?Balance Overall balance assessment: Needs assistance ?Sitting-balance support: No upper extremity supported, Feet supported ?Sitting balance-Leahy Scale: Poor ?Sitting balance - Comments: BUE support of bed features/rail and mod to maxA for seated balance, posterior lean due to RLE pain, tolerates ~30 sec only ?  ?  ?  ?  ?  ?  ?  ?  ?  ?  ?  ?  ?  ?  ?  ?  ? ?  ?Cognition Arousal/Alertness: Awake/alert ?Behavior During Therapy: Restless, Impulsive ?Overall Cognitive Status: Difficult to assess ?  ?  ?  ?  ?  ?  ?  ?  ?  ?  ?  ?  ?  ?  ?  ?  ?  General Comments: Following ~25% of one-step commands with increased time and cues. Pt restless and impulsive due to pain once seated following <10% of  seated commands. ?  ?  ? ?  ?Exercises Other Exercises ?Other Exercises: supine RLE A/AAROM: hip flexion (AROM), hip abduction (AA), hip extension (AA) x5-15 reps ea ?Other Exercises: supine LLE AROM: SLR, hip/knee flexion x5 reps ?Other Exercises: Rolling to L/R sides x2 reps ?Other Exercises: static sitting at EOB for core and UE strengthening, emphasis on midline posture x2 trials ~1 min ea ? ?  ?General Comments General comments (skin integrity, edema, etc.): HR 70's bpm resting and up to 110 bpm with exertion/pain (per monitor) ?  ?  ? ?Pertinent Vitals/Pain Pain Assessment ?Pain Assessment: Faces ?Faces Pain Scale: Hurts whole lot ?Pain Location: RLE with upright seated posture and AAROM palpation/tactile cues during exercises ?Pain Descriptors / Indicators: Grimacing, Moaning, Operative site guarding ?Pain Intervention(s): Limited activity within patient's tolerance, Monitored during session, Repositioned, Patient requesting pain meds-RN notified  ? ? ? ?PT Goals (current goals can now be found in the care plan section) Acute Rehab PT Goals ?Patient Stated Goal: none stated ?PT Goal Formulation: Patient unable to participate in goal setting ?Time For Goal Achievement: 08/17/21 ?Progress towards PT goals: Progressing toward goals ? ?  ?Frequency ? ? ? Min 3X/week ? ? ? ?  ?PT Plan Current plan remains appropriate  ? ? ?   ?AM-PAC PT "6 Clicks" Mobility   ?Outcome Measure ? Help needed turning from your back to your side while in a flat bed without using bedrails?: A Little ?Help needed moving from lying on your back to sitting on the side of a flat bed without using bedrails?: A Lot ?Help needed moving to and from a bed to a chair (including a wheelchair)?: Total ?Help needed standing up from a chair using your arms (e.g., wheelchair or bedside chair)?: Total ?Help needed to walk in hospital room?: Total ?Help needed climbing 3-5 steps with a railing? : Total ?6 Click Score: 9 ? ?  ?End of Session   ?Activity  Tolerance: Patient limited by pain ?Patient left: in bed;with call bell/phone within reach;with bed alarm set;Other (comment) (HOB >30 degrees as pt requesting to drink his beverage, 3 blankets as he is c/o feeling cold) ?Nurse Communication: Mobility status;Need for lift equipment;Patient requests pain meds ?PT Visit Diagnosis: Pain;Muscle weakness (generalized) (M62.81) ?Pain - Right/Left: Right ?Pain - part of body: Leg (and catheter insertion site near groin) ?  ? ? ?Time: 7741-2878 ?PT Time Calculation (min) (ACUTE ONLY): 16 min ? ?Charges:  $Therapeutic Exercise: 8-22 mins          ?          ? ?Ashtian Villacis P., PTA ?Acute Rehabilitation Services ?Pager: [phone] ?Office: [phone]  ? ? ?Kara Pacer Kendel Bessey ?08/05/2021, 11:46 AM ? ?

## 2021-08-05 NOTE — Progress Notes (Signed)
Pt d/c, report called, IV out, no tele to return, PTAR for transport.  ? ?Chrisandra Carota, RN ?08/05/2021 ?11:09 AM  ?

## 2021-08-05 NOTE — TOC Progression Note (Addendum)
Transition of Care (TOC) - Progression Note  ? ? ?Patient Details  ?Name: Todd Dunn ?MRN: [id number] ?Date of Birth: 03-22-1941 ? ?Transition of Care (TOC) CM/SW Contact  ?Benard Halsted, LCSW ?Phone Number: ?08/05/2021, 8:50 AM ? ?Clinical Narrative:    ?Insurance approval received for [employer], Ref# [id number], Auth ID# [id number], effective 08/04/2021-08/06/2021. No need for updated DC Summary per [facility]. Patient will go to room 123. CSW updated patient's granddaughter.  ? ? ?Expected Discharge Plan: [place] ?Barriers to Discharge: Barriers Resolved ? ?Expected Discharge Plan and Services ?Expected Discharge Plan: [place] ?In-house Referral: Clinical Social Work ?  ?Post Acute Care Choice: [place] ?Living arrangements for the past 2 months: [place] ?Expected Discharge Date: 08/05/21               ?  ?  ?  ?  ?  ?  ?  ?  ?  ?  ? ? ?Social Determinants of Health (SDOH) Interventions ?  ? ?Readmission Risk Interventions ?Readmission Risk Prevention Plan 08/04/2021 06/24/2021 02/17/2021  ?Transportation Screening Complete Complete Complete  ?Medication Review Press photographer) Complete Complete Complete  ?PCP or Specialist appointment within 3-5 days of discharge Complete Complete Complete  ?[place] or Home Care Consult Complete Complete Complete  ?SW Recovery Care/Counseling Consult Complete - Complete  ?Palliative Care Screening Not Applicable Not Applicable Not Applicable  ?Skilled Nursing Facility Complete Not Applicable Not Applicable  ? ? ?

## 2021-08-05 NOTE — Progress Notes (Signed)
Pt discharged from unit. Medication/discharge instruction given to PTAR in discharge packet  ? ?Phoebe Sharps, RN ? ?

## 2021-08-05 NOTE — TOC Transition Note (Signed)
Transition of Care (TOC) - CM/SW Discharge Note ? ? ?Patient Details  ?Name: Todd Dunn ?MRN: [id number] ?Date of Birth: 08/03/1940 ? ?Transition of Care (TOC) CM/SW Contact:  ?Benard Halsted, LCSW ?Phone Number: ?08/05/2021, 9:58 AM ? ? ?Clinical Narrative:    ?Patient will DC to: [facility] ?Anticipated DC date: 08/05/21 ?Family notified: Granddaughter, [name] ?Transport by: Corey Harold ? ? ?Per MD patient ready for DC to [facility]. RN to call report prior to discharge [phone]). RN, patient, patient's family, and facility notified of DC. Discharge Summary and FL2 sent to facility. DC packet on chart. Ambulance transport requested for patient.  ? ?CSW will sign off for now as social work intervention is no longer needed. Please consult [place] again if new needs arise. ? ? ? ? ?Final next level of care: [place] ?Barriers to Discharge: Barriers Resolved ? ? ?Patient Goals and CMS Choice ?Patient states their goals for this hospitalization and ongoing recovery are:: Rehab ?CMS Medicare.gov Compare Post Acute Care list provided to:: Patient Represenative (must comment) ?Choice offered to / list presented to :  (Granddaughter) ? ?Discharge Placement ?  ?Existing PASRR number confirmed : 08/05/21          ?Patient chooses bed at: [facility] ?Patient to be transferred to facility by: PTAR ?Name of family member notified: [name] ?Patient and family notified of of transfer: 08/05/21 ? ?Discharge Plan and Services ?In-house Referral: Clinical Social Work ?  ?Post Acute Care Choice: [place]          ?  ?  ?  ?  ?  ?  ?  ?  ?  ?  ? ?Social Determinants of Health (SDOH) Interventions ?  ? ? ?Readmission Risk Interventions ?Readmission Risk Prevention Plan 08/04/2021 06/24/2021 02/17/2021  ?Transportation Screening Complete Complete Complete  ?Medication Review Press photographer) Complete Complete Complete  ?PCP or Specialist appointment within 3-5 days of discharge Complete Complete  Complete  ?[place] or Home Care Consult Complete Complete Complete  ?SW Recovery Care/Counseling Consult Complete - Complete  ?Palliative Care Screening Not Applicable Not Applicable Not Applicable  ?Skilled Nursing Facility Complete Not Applicable Not Applicable  ? ? ? ? ? ?

## 2021-08-06 ENCOUNTER — Encounter (HOSPITAL_COMMUNITY): Payer: Self-pay | Admitting: Oncology

## 2021-08-06 ENCOUNTER — Other Ambulatory Visit: Payer: Self-pay

## 2021-08-06 ENCOUNTER — Emergency Department (HOSPITAL_COMMUNITY)
Admission: EM | Admit: 2021-08-06 | Discharge: 2021-08-06 | Disposition: A | Discharge status: Home / Self Care | Payer: Medicare Other | Attending: Emergency Medicine | Admitting: Emergency Medicine

## 2021-08-06 ENCOUNTER — Emergency Department (HOSPITAL_COMMUNITY): Payer: Medicare Other

## 2021-08-06 DIAGNOSIS — T83518A Infection and inflammatory reaction due to other urinary catheter, initial encounter: Secondary | ICD-10-CM | POA: Insufficient documentation

## 2021-08-06 DIAGNOSIS — Z7902 Long term (current) use of antithrombotics/antiplatelets: Secondary | ICD-10-CM | POA: Diagnosis not present

## 2021-08-06 DIAGNOSIS — Z79899 Other long term (current) drug therapy: Secondary | ICD-10-CM | POA: Insufficient documentation

## 2021-08-06 DIAGNOSIS — Y733 Surgical instruments, materials and gastroenterology and urology devices (including sutures) associated with adverse incidents: Secondary | ICD-10-CM | POA: Diagnosis not present

## 2021-08-06 DIAGNOSIS — T83028A Displacement of other indwelling urethral catheter, initial encounter: Secondary | ICD-10-CM | POA: Insufficient documentation

## 2021-08-06 DIAGNOSIS — T83010A Breakdown (mechanical) of cystostomy catheter, initial encounter: Secondary | ICD-10-CM

## 2021-08-06 DIAGNOSIS — Y732 Prosthetic and other implants, materials and accessory gastroenterology and urology devices associated with adverse incidents: Secondary | ICD-10-CM | POA: Insufficient documentation

## 2021-08-06 DIAGNOSIS — Z20822 Contact with and (suspected) exposure to covid-19: Secondary | ICD-10-CM | POA: Diagnosis not present

## 2021-08-06 HISTORY — PX: IR US GUIDE BX ASP/DRAIN: IMG2392

## 2021-08-06 LAB — CBC WITH DIFFERENTIAL/PLATELET
Abs Immature Granulocytes: 0.03 10*3/uL (ref 0.00–0.07)
Basophils Absolute: 0 10*3/uL (ref 0.0–0.1)
Basophils Relative: 0 %
Eosinophils Absolute: 0 10*3/uL (ref 0.0–0.5)
Eosinophils Relative: 0 %
HCT: 40.1 % (ref 39.0–52.0)
Hemoglobin: 12.2 g/dL — ABNORMAL LOW (ref 13.0–17.0)
Immature Granulocytes: 0 %
Lymphocytes Relative: 13 %
Lymphs Abs: 0.9 10*3/uL (ref 0.7–4.0)
MCH: 25.7 pg — ABNORMAL LOW (ref 26.0–34.0)
MCHC: 30.4 g/dL (ref 30.0–36.0)
MCV: 84.4 fL (ref 80.0–100.0)
Monocytes Absolute: 0.5 10*3/uL (ref 0.1–1.0)
Monocytes Relative: 8 %
Neutro Abs: 5.4 10*3/uL (ref 1.7–7.7)
Neutrophils Relative %: 79 %
Platelets: 217 10*3/uL (ref 150–400)
RBC: 4.75 MIL/uL (ref 4.22–5.81)
RDW: 22 % — ABNORMAL HIGH (ref 11.5–15.5)
WBC: 6.8 10*3/uL (ref 4.0–10.5)
nRBC: 0 % (ref 0.0–0.2)

## 2021-08-06 LAB — RESP PANEL BY RT-PCR (FLU A&B, COVID) ARPGX2
Influenza A by PCR: NEGATIVE
Influenza B by PCR: NEGATIVE
SARS Coronavirus 2 by RT PCR: NEGATIVE

## 2021-08-06 LAB — COMPREHENSIVE METABOLIC PANEL
ALT: 22 U/L (ref 0–44)
AST: 23 U/L (ref 15–41)
Albumin: 3.1 g/dL — ABNORMAL LOW (ref 3.5–5.0)
Alkaline Phosphatase: 119 U/L (ref 38–126)
Anion gap: 5 (ref 5–15)
BUN: 20 mg/dL (ref 8–23)
CO2: 30 mmol/L (ref 22–32)
Calcium: 9.6 mg/dL (ref 8.9–10.3)
Chloride: 101 mmol/L (ref 98–111)
Creatinine, Ser: 0.76 mg/dL (ref 0.61–1.24)
GFR, Estimated: >60 mL/min (ref >60)
Glucose, Bld: 195 mg/dL — ABNORMAL HIGH (ref 70–99)
Potassium: 3.7 mmol/L (ref 3.5–5.1)
Sodium: 136 mmol/L (ref 135–145)
Total Bilirubin: 0.4 mg/dL (ref 0.3–1.2)
Total Protein: 6.7 g/dL (ref 6.5–8.1)

## 2021-08-06 LAB — PROTIME-INR
INR: 1.2 (ref 0.8–1.2)
Prothrombin Time: 15 seconds (ref 11.4–15.2)

## 2021-08-06 MED ORDER — MORPHINE SULFATE (PF) 2 MG/ML IV SOLN
2.0000 mg | Freq: Once | INTRAVENOUS | Status: AC
  Administered 2021-08-06: 2 mg via INTRAVENOUS
  Filled 2021-08-06: qty 1

## 2021-08-06 MED ORDER — LIDOCAINE HCL 1 % IJ SOLN
INTRAMUSCULAR | Status: AC
  Filled 2021-08-06: qty 20

## 2021-08-06 MED ORDER — LIDOCAINE HCL 1 % IJ SOLN
INTRAMUSCULAR | Status: DC | PRN
  Administered 2021-08-06: 10 mL

## 2021-08-06 MED ORDER — IOHEXOL 350 MG/ML SOLN
10.0000 mL | Freq: Once | INTRAVENOUS | Status: AC | PRN
  Administered 2021-08-06: 10 mL

## 2021-08-06 NOTE — ED Triage Notes (Signed)
Pt bib [facility] from [facility] for dislodged suprapubic catheter.  ?

## 2021-08-06 NOTE — Procedures (Signed)
Pre procedural Dx: Urinary retention, inadvertent removal of suprapubic catheter.  ?Post procedural Dx: Same ? ?Technically successful [place] and fluoro guided placed of a 16 Fr drainage catheter placement into the urinary bladder.   ?Suprapubic catheter connected to foley bag.  ? ?EBL: Trace ?Complications: None immediate ? ?Ronny Bacon, MD ?Pager #: [phone] ? ? ?

## 2021-08-06 NOTE — ED Notes (Signed)
Pt off the floor

## 2021-08-06 NOTE — Discharge Instructions (Signed)
?  Your suprapubic catheter required replacement today.  Interventional radiology - Dr. Pascal Lux - performed this procedure. ? ?The catheter will need fluoroscopic guided exchange and conversion to a 16 [place] tip Foley catheter in approximately 4 to 6 weeks.  Please arrange this in the outpatient setting with interventional radiology and Dr. Pascal Lux. ? ?

## 2021-08-06 NOTE — ED Provider Notes (Signed)
[facility] DEPT Provider Note   CSN: [id number] Arrival date & time: 08/06/21  0841     History  Chief Complaint  Patient presents with   Suprapubic Catheter Dislodged     Todd Dunn is a 81 y.o. male.  81 year old male with prior medical history as detailed below presents for evaluation.  Patient with recent insertion of suprapubic catheter by IR on February 27.  Overnight the patient removed his suprapubic catheter.  No other event reported.  Patient is otherwise without complaint.  The history is provided by the patient, the EMS personnel and medical records.  Illness Location:  Inadvertent removal of suprapubic catheter Severity:  Mild Onset quality:  Unable to specify Timing:  Unable to specify Progression:  Unable to specify     Home Medications Prior to Admission medications   Medication Sig Start Date End Date Taking? Authorizing Provider  albuterol (VENTOLIN HFA) 108 (90 Base) MCG/ACT inhaler Inhale 2 puffs into the lungs every 6 (six) hours as needed for wheezing or shortness of breath. Patient not taking: Reported on 07/30/2021 06/24/21   Terrilee Croak, MD  amLODipine (NORVASC) 10 MG tablet Take 1 tablet (10 mg total) by mouth daily. 07/17/21   [name], NP  cephALEXin (KEFLEX) 500 MG capsule Take 1 capsule (500 mg total) by mouth 3 (three) times daily. 7/59/16   Delora Fuel, MD  clopidogrel (PLAVIX) 75 MG tablet Take 1 tablet (75 mg total) by mouth at bedtime. 07/17/21   [name], NP  divalproex (DEPAKOTE) 125 MG DR tablet Take 1 tablet (125 mg total) by mouth at bedtime. 07/17/21 10/15/21  [name], NP  finasteride (PROSCAR) 5 MG tablet Take 1 tablet (5 mg total) by mouth every morning. 06/24/21 09/22/21  Terrilee Croak, MD  HYDROcodone-acetaminophen (NORCO) 5-325 MG tablet Take 1 tablet by mouth every 6 (six) hours as needed for moderate pain. 08/04/21   Dessa Phi, DO  nitroGLYCERIN (NITROSTAT) 0.4 MG SL  tablet Place 0.4 mg under the tongue every 5 (five) minutes x 3 doses as needed for chest pain. Patient not taking: Reported on 07/30/2021    [provider]  pantoprazole (PROTONIX) 40 MG tablet TAKE 1 TABLET(40 MG) BY MOUTH DAILY Patient taking differently: Take 40 mg by mouth daily. 07/21/21   Charlott Rakes, MD  polyethylene glycol (MIRALAX / GLYCOLAX) 17 g packet Take 17 g by mouth daily as needed for mild constipation. Patient not taking: Reported on 07/30/2021 07/17/21   [name], NP  rosuvastatin (CRESTOR) 10 MG tablet Take 1 tablet (10 mg total) by mouth every morning. 07/17/21   [name], NP  vitamin B-12 (CYANOCOBALAMIN) 1000 MCG tablet Take 1 tablet (1,000 mcg total) by mouth daily. 07/17/21   [name], NP  Vitamin D, Ergocalciferol, (DRISDOL) 1.25 MG (50000 UNIT) CAPS capsule Take 50,000 Units by mouth every Monday.    [provider]      Allergies    Food, Aspirin, and [place] clam (m. mercenaria) skin test    Review of Systems   Review of Systems  All other systems reviewed and are negative.  Physical Exam Updated Vital Signs BP 118/66    Pulse 68    Temp 98.3 F (36.8 C) (Rectal)    Resp 15    SpO2 100%  Physical Exam Vitals and nursing note reviewed.  Constitutional:      General: He is not in acute distress.    Appearance:  Normal appearance. He is well-developed.  HENT:     Head: Normocephalic and atraumatic.  Eyes:     Conjunctiva/sclera: Conjunctivae normal.     Pupils: Pupils are equal, round, and reactive to light.  Cardiovascular:     Rate and Rhythm: Normal rate and regular rhythm.     Heart sounds: Normal heart sounds.  Pulmonary:     Effort: Pulmonary effort is normal. No respiratory distress.     Breath sounds: Normal breath sounds.  Abdominal:     General: There is no distension.     Palpations: Abdomen is soft.     Tenderness: There is no abdominal tenderness.  Musculoskeletal:        General: No  deformity. Normal range of motion.     Cervical back: Normal range of motion and neck supple.  Skin:    General: Skin is warm and dry.     Comments: Site of suprapubic catheter is clean.  No drainage noted.  See images below.  Neurological:     General: No focal deficit present.     Mental Status: He is alert and oriented to person, place, and time.     ED Results / Procedures / Treatments   Labs (all labs ordered are listed, but only abnormal results are displayed) Labs Reviewed  COMPREHENSIVE METABOLIC PANEL - Abnormal; Notable for the following components:      Result Value   Glucose, Bld 195 (*)    Albumin 3.1 (*)    All other components within normal limits  CBC WITH DIFFERENTIAL/PLATELET - Abnormal; Notable for the following components:   Hemoglobin 12.2 (*)    MCH 25.7 (*)    RDW 22.0 (*)    All other components within normal limits  RESP PANEL BY RT-PCR (FLU A&B, COVID) ARPGX2  PROTIME-INR    EKG None  Radiology IR US Guide Bx Asp/Drain  Result Date: 08/06/2021 INDICATION: History of urinary retention, post image guided placement of a suprapubic catheter on 08/03/2021 Unfortunately, the patient inadvertently removed the recently placed suprapubic catheter and presents now for urgent fluoroscopic guided replacement and or definitive new suprapubic catheter placement. EXAM: ULTRASOUND AND FLUOROSCOPIC GUIDED SUPRAPUBIC CATHETER PLACEMENT COMPARISON:  Ultrasound and fluoroscopic guided suprapubic catheter placement-08/03/2021 MEDICATIONS: None ANESTHESIA/SEDATION: None CONTRAST:  None COMPLICATIONS: None immediate. PROCEDURE: A timeout was performed prior to the initiation of the procedure. The patient was positioned supine on the fluoroscopy table. The skin overlying the anterior aspect the lower pelvis was prepped and draped in usual sterile fashion. The site of the pre-existing recently removed suprapubic catheter was marked fluoroscopically and the track was cannulated  with a Kum[place] catheter. Contrast injection failed to delineate a persistent patent tract to the level of the urinary bladder. As such the decision was made to proceed with definitive new suprapubic catheter placement. Under direct ultrasound guidance, the inferior, anterior aspect of the urinary bladder was accessed with an 18 gauge trocar needle after the overlying soft tissues were anesthetized with 1% lidocaine with epinephrine. An ultrasound image was saved for procedural documentation purposes Next, a short Amplatz wire was coiled within the urinary bladder. The track was dilated ultimately allowing placement of a 14 French all-purpose drainage catheter with end coiled and locked within the urinary bladder. Contrast injection demonstrated appropriate positioning and functionality of the new suprapubic catheter. A postprocedural spot fluoroscopic image was obtained The external portion of the drainage catheter was secured at the skin entrance site with two interrupted sutures and a StatLock  device. The drainage catheter was connected to a Foley bag. A dressing was applied. The patient tolerated the procedure well without immediate postprocedural complication. FINDINGS: Attempted opacification of the recently placed suprapubic catheter demonstrated closure of the subcutaneous track. After ultrasound and fluoroscopic guidance, the new 14 [place] all-purpose drainage catheter is appropriately positioned with end coiled and locked within the urinary bladder. IMPRESSION: Technically successful ultrasound and placement of a new 14 French suprapubic catheter. PLAN: Recommend fluoroscopic guided exchange and conversion to a 16 [place] tip Foley catheter in approximately 4-6 weeks. Electronically Signed   By: Sandi Mariscal M.D.   On: 08/06/2021 12:00    Procedures Procedures    Medications Ordered in ED Medications  lidocaine (XYLOCAINE) 1 % (with pres) injection (has no administration in time range)   lidocaine (XYLOCAINE) 1 % (with pres) injection (10 mLs Infiltration Given 08/06/21 1111)  iohexol (OMNIPAQUE) 350 MG/ML injection 10 mL (10 mLs Other Contrast Given 08/06/21 1120)  morphine (PF) 2 MG/ML injection 2 mg (2 mg Intravenous Given 08/06/21 1211)    ED Course/ Medical Decision Making/ A&P                           Medical Decision Making Amount and/or Complexity of Data Reviewed Labs: ordered. Radiology: ordered.  Risk Prescription drug management.    Medical Screen Complete  This patient presented to the ED with complaint of inadvertent removal of suprapubic catheter..  This complaint involves an extensive number of treatment options. The initial differential diagnosis includes, but is not limited to, need for replacement of suprapubic catheter  This presentation is: Acute, Self-Limited, Previously Undiagnosed, Uncertain Prognosis, Complicated, Systemic Symptoms, and Threat to Life/Bodily Function   Patient with recently placed suprapubic catheter on February 27.  Overnight the patient was able to remove this.  Patient will require replacement with interventional radiology.   Dr. Pascal Lux, with interventional radiology, has replaced the suprapubic catheter.  Patient tolerated this well.  Screening labs obtained are without other significant abnormality.  Patient is appropriate for discharge.    Additional history obtained:  External records from outside sources obtained and reviewed including prior ED visits and prior Inpatient records.    Lab Tests:  I ordered and personally interpreted labs.  The pertinent results include: CBC, CMP, COVID, flu, INR  Cardiac Monitoring:  The patient was maintained on a cardiac monitor.  I personally viewed and interpreted the cardiac monitor which showed an underlying rhythm of: NSR   Medicines ordered:  I ordered medication including morphine for pain Reevaluation of the patient after these medicines showed that the  patient: improved    Problem List / ED Course:  Replacement of suprapubic catheter   Reevaluation:  After the interventions noted above, I reevaluated the patient and found that they have: improved  Disposition:  After consideration of the diagnostic results and the patients response to treatment, I feel that the patent would benefit from close outpatient follow-up.          Final Clinical Impression(s) / ED Diagnoses Final diagnoses:  Suprapubic catheter dysfunction, initial encounter [facility])    Rx / DC Orders ED Discharge Orders     None         [name], MD 08/06/21 1316

## 2021-08-11 ENCOUNTER — Ambulatory Visit: Payer: Medicare Other | Admitting: Vascular Surgery

## 2021-08-11 ENCOUNTER — Emergency Department (HOSPITAL_COMMUNITY)
Admission: EM | Admit: 2021-08-11 | Discharge: 2021-08-12 | Disposition: A | Discharge status: Skilled Nursing Facility | Payer: Medicare Other | Attending: Emergency Medicine | Admitting: Emergency Medicine

## 2021-08-11 ENCOUNTER — Emergency Department (HOSPITAL_COMMUNITY): Payer: Medicare Other

## 2021-08-11 ENCOUNTER — Other Ambulatory Visit: Payer: Self-pay

## 2021-08-11 ENCOUNTER — Encounter (HOSPITAL_COMMUNITY): Payer: Self-pay | Admitting: Emergency Medicine

## 2021-08-11 DIAGNOSIS — Z20822 Contact with and (suspected) exposure to covid-19: Secondary | ICD-10-CM | POA: Diagnosis not present

## 2021-08-11 DIAGNOSIS — I2510 Atherosclerotic heart disease of native coronary artery without angina pectoris: Secondary | ICD-10-CM | POA: Insufficient documentation

## 2021-08-11 DIAGNOSIS — R309 Painful micturition, unspecified: Secondary | ICD-10-CM | POA: Diagnosis not present

## 2021-08-11 DIAGNOSIS — R220 Localized swelling, mass and lump, head: Secondary | ICD-10-CM | POA: Insufficient documentation

## 2021-08-11 DIAGNOSIS — T83098A Other mechanical complication of other indwelling urethral catheter, initial encounter: Secondary | ICD-10-CM | POA: Insufficient documentation

## 2021-08-11 DIAGNOSIS — R4182 Altered mental status, unspecified: Secondary | ICD-10-CM | POA: Diagnosis not present

## 2021-08-11 DIAGNOSIS — I10 Essential (primary) hypertension: Secondary | ICD-10-CM | POA: Diagnosis not present

## 2021-08-11 DIAGNOSIS — Z79899 Other long term (current) drug therapy: Secondary | ICD-10-CM | POA: Diagnosis not present

## 2021-08-11 DIAGNOSIS — R791 Abnormal coagulation profile: Secondary | ICD-10-CM | POA: Diagnosis not present

## 2021-08-11 DIAGNOSIS — Z9359 Other cystostomy status: Secondary | ICD-10-CM

## 2021-08-11 DIAGNOSIS — R410 Disorientation, unspecified: Secondary | ICD-10-CM

## 2021-08-11 MED ORDER — ACETAMINOPHEN 500 MG PO TABS: 1000.0000 mg | ORAL_TABLET | Freq: Four times a day (QID) | ORAL | Status: DC | PRN

## 2021-08-11 NOTE — ED Provider Notes (Signed)
[facility] Provider Note   CSN: [id number] Arrival date & time: 08/11/21  2301     History  Chief Complaint  Patient presents with   Altered Mental Status    Pt arrive by EMS from [facility] for c/o been more alter than usual recently diagnosed with UTI. Foley cathter from SNF. Pt getting Hydrocodone for pain relief on EMS arrival pt just got his pain medication.    [facility] is a 81 y.o. male.  HPI     This is an 81 year old male who presents from [place] care with concerns for altered mental status.  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  He also subsequently underwent suprapubic catheter placement while in the hospital for chronic urinary retention.  He had been on antibiotics starting on 2/21.  It is unclear whether he finished his course.  History is taken primarily from chart review and from the granddaughter.  Granddaughter states that since being at the outside facility, she is unsure whether he has been getting his medications.  She describes him as being delirious.  She is concerned he may have a recurrent UTI.  Not noted any fevers.  She reports that he has had multiple falls on a daily basis while at the facility and she noted on the bump on the back of his head.  When asked, patient does not respond to basic orientation questions.  He does state that he has pain with urination.  Notes reviewed.  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  He was not treated for UTI based on that culture.  He subsequently has been treated with Keflex for a urinalysis in February; however, no culture data was available.  Home Medications Prior to Admission medications   Medication Sig Start Date End Date Taking? Authorizing Provider  albuterol  (VENTOLIN HFA) 108 (90 Base) MCG/ACT inhaler Inhale 2 puffs into the lungs every 6 (six) hours as needed for wheezing or shortness of breath. Patient not taking: Reported on 07/30/2021 06/24/21   Terrilee Croak, MD  amLODipine (NORVASC) 10 MG tablet Take 1 tablet (10 mg total) by mouth daily. 07/17/21   [name], NP  cephALEXin (KEFLEX) 500 MG capsule Take 1 capsule (500 mg total) by mouth 3 (three) times daily. 10/15/23   Delora Fuel, MD  clopidogrel (PLAVIX) 75 MG tablet Take 1 tablet (75 mg total) by mouth at bedtime. 07/17/21   [name], NP  divalproex (DEPAKOTE) 125 MG DR tablet Take 1 tablet (125 mg total) by mouth at bedtime. 07/17/21 10/15/21  [name], NP  finasteride (PROSCAR) 5 MG tablet Take 1 tablet (5 mg total) by mouth every morning. 06/24/21 09/22/21  Terrilee Croak, MD  HYDROcodone-acetaminophen (NORCO) 5-325 MG tablet Take 1 tablet by mouth every 6 (six) hours as needed for moderate pain. 08/04/21   Dessa Phi, DO  nitroGLYCERIN (NITROSTAT) 0.4 MG SL tablet Place 0.4 mg under the tongue every 5 (five) minutes x 3 doses as needed for chest pain. Patient not taking: Reported on 07/30/2021    [provider]  pantoprazole (PROTONIX) 40 MG tablet TAKE 1 TABLET(40 MG) BY MOUTH DAILY Patient taking differently: Take 40 mg by mouth daily. 07/21/21   Charlott Rakes, MD  polyethylene glycol (MIRALAX / GLYCOLAX) 17 g packet Take 17 g by  mouth daily as needed for mild constipation. Patient not taking: Reported on 07/30/2021 07/17/21   [name], NP  rosuvastatin (CRESTOR) 10 MG tablet Take 1 tablet (10 mg total) by mouth every morning. 07/17/21   [name], NP  vitamin B-12 (CYANOCOBALAMIN) 1000 MCG tablet Take 1 tablet (1,000 mcg total) by mouth daily. 07/17/21   [name], NP  Vitamin D, Ergocalciferol, (DRISDOL) 1.25 MG (50000 UNIT) CAPS capsule Take 50,000 Units by mouth every Monday.    [provider]      Allergies    Food, Aspirin,  and [place] clam (m. mercenaria) skin test    Review of Systems   Review of Systems  Unable to perform ROS: Mental status change   Physical Exam Updated Vital Signs BP 132/90 (BP Location: Right Arm)    Pulse 78    Temp (!) 97.5 F (36.4 C) (Oral)    Resp 18    Ht 1.829 m (6')    Wt 59 kg    SpO2 100%    BMI 17.64 kg/m  Physical Exam Vitals and nursing note reviewed.  Constitutional:      Appearance: He is well-developed.     Comments: Generally ill-appearing and deconditioned, no acute distress  HENT:     Head: Normocephalic and atraumatic.     Mouth/Throat:     Mouth: Mucous membranes are dry.  Eyes:     Pupils: Pupils are equal, round, and reactive to light.  Cardiovascular:     Rate and Rhythm: Normal rate. Rhythm irregular.     Heart sounds: Normal heart sounds. No murmur heard. Pulmonary:     Effort: Pulmonary effort is normal. No respiratory distress.     Breath sounds: Normal breath sounds. No wheezing.  Abdominal:     General: Bowel sounds are normal.     Palpations: Abdomen is soft.     Tenderness: There is no abdominal tenderness. There is no rebound.     Comments: Suprapubic catheter in place  Musculoskeletal:     Cervical back: Neck supple.     Comments: Right AKA with staples in place, no adjacent redness  Lymphadenopathy:     Cervical: No cervical adenopathy.  Skin:    General: Skin is warm and dry.  Neurological:     Comments: Disoriented, generally difficult to direct, occasionally pulls at his catheter  Psychiatric:     Comments: Unable to assess    ED Results / Procedures / Treatments   Labs (all labs ordered are listed, but only abnormal results are displayed) Labs Reviewed  CULTURE, BLOOD (ROUTINE X 2)  CULTURE, BLOOD (ROUTINE X 2)  URINE CULTURE  RESP PANEL BY RT-PCR (FLU A&B, COVID) ARPGX2  LACTIC ACID, PLASMA  LACTIC ACID, PLASMA  COMPREHENSIVE METABOLIC PANEL  CBC WITH DIFFERENTIAL/PLATELET  PROTIME-INR  APTT  URINALYSIS,  ROUTINE W REFLEX MICROSCOPIC    EKG None  Radiology No results found.  Procedures Procedures    Medications Ordered in ED Medications  acetaminophen (TYLENOL) tablet 1,000 mg (has no administration in time range)    ED Course/ Medical Decision Making/ A&P                           Medical Decision Making Amount and/or Complexity of Data Reviewed Labs: ordered. Radiology: ordered. ECG/medicine tests: ordered.  Risk OTC drugs.   This patient presents to the ED for concern of altered mental status, falls, possible UTI,  this involves an extensive number of treatment options, and is a complaint that carries with it a high risk of complications and morbidity.  The differential diagnosis includes infectious process, delirium which is multifactorial, head injury, metabolic derangement  MDM:    This is a 81 year old male with recent AKA and chronic indwelling Foley catheter who presents with his granddaughter.  She is concerned about worsening confusion and delirium.  He is also had frequent falls.  She requested that he was transferred for evaluation.  He is nontoxic.  He is disoriented and really unable to provide history.  His AKA site appears appropriate and is clean dry and intact without evidence of infection.  His vital signs are largely reassuring.  Temperature 97.8.  He is in a paced rhythm with PACs.  Labs obtained.  No leukocytosis.  Normal lactate.  No significant metabolic derangements.  CT head does not show any evidence of acute traumatic injury or bleed.  He has no objective signs or symptoms of sepsis.  He recently had his suprapubic Foley catheter changed on March 2.  Urinalysis today is nitrite negative but does showed numerous bacteria and white cells.  It is similar to the one in January.  Had a long discussion with the granddaughter that without other objective signs of acute infection, I would favor holding off any treatment.  Urine culture is pending.  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  I suspect his delirium is multifactorial given his recent hospitalization, surgery, new environment.  He does not appear to have any traumatic injury related to falls.  Will transfer back to facility awaiting urine culture results. (Labs, imaging)  Labs: I Ordered, and personally interpreted labs.  The pertinent results include: Urinalysis with white cells and many bacteria, normal white count, normal lactate  Imaging Studies ordered: I ordered imaging studies including CT head negative I independently visualized and interpreted imaging. I agree with the radiologist interpretation  Additional history obtained from granddaughter.  External records from outside source obtained and reviewed including prior inpatient hospitalization including AKA  Critical Interventions: Tylenol for pain  Consultations: I requested consultation with the NA,  and discussed lab and imaging findings as well as pertinent plan - they recommend: N/A  Cardiac Monitoring: The patient was maintained on a cardiac monitor.  I personally viewed and interpreted the cardiac monitored which showed an underlying rhythm of: Paced rhythm  Reevaluation: After the interventions noted above, I reevaluated the patient and found that they have :stayed the same   Considered admission for: Infection, delirium  Social Determinants of Health: Currently in rehab  Disposition: Discharge  Co morbidities that complicate the patient evaluation  Past Medical History:  Diagnosis Date   BPH (benign prostatic hyperplasia)    CAD (coronary artery disease)    Chronic indwelling Foley catheter    CVA (cerebral vascular accident) ([place])    GERD (gastroesophageal reflux disease)    HTN (hypertension)    Neurogenic bladder    SSS (sick sinus syndrome) ([place])    s/p MDT ICD   Vitamin D deficiency      Medicines Meds ordered this  encounter  Medications   acetaminophen (TYLENOL) tablet 1,000 mg    I have reviewed the patients home medicines and have made adjustments as needed  Problem List / ED Course: Problem List Items Addressed This Visit       Other   AMS (altered mental status) - Primary  Other Visit Diagnoses     Chronic suprapubic catheter [facility])                       Final Clinical Impression(s) / ED Diagnoses Final diagnoses:  None    Rx / DC Orders ED Discharge Orders     None         Merryl Hacker, MD 08/12/21 [phone]

## 2021-08-11 NOTE — ED Triage Notes (Signed)
Pt arrive by EMS from [facility] for c/o been more alter than usual recently diagnosed with UTI. Foley cathter from SNF. Pt getting Hydrocodone for pain relief on EMS arrival pt just got his pain medication. ?

## 2021-08-12 LAB — CBC WITH DIFFERENTIAL/PLATELET
Abs Immature Granulocytes: 0.03 10*3/uL (ref 0.00–0.07)
Basophils Absolute: 0.1 10*3/uL (ref 0.0–0.1)
Basophils Relative: 1 %
Eosinophils Absolute: 0.1 10*3/uL (ref 0.0–0.5)
Eosinophils Relative: 1 %
HCT: 37.6 % — ABNORMAL LOW (ref 39.0–52.0)
Hemoglobin: 11.7 g/dL — ABNORMAL LOW (ref 13.0–17.0)
Immature Granulocytes: 1 %
Lymphocytes Relative: 29 %
Lymphs Abs: 1.9 10*3/uL (ref 0.7–4.0)
MCH: 26.3 pg (ref 26.0–34.0)
MCHC: 31.1 g/dL (ref 30.0–36.0)
MCV: 84.5 fL (ref 80.0–100.0)
Monocytes Absolute: 0.6 10*3/uL (ref 0.1–1.0)
Monocytes Relative: 9 %
Neutro Abs: 3.9 10*3/uL (ref 1.7–7.7)
Neutrophils Relative %: 59 %
Platelets: 263 10*3/uL (ref 150–400)
RBC: 4.45 MIL/uL (ref 4.22–5.81)
RDW: 21.1 % — ABNORMAL HIGH (ref 11.5–15.5)
WBC: 6.5 10*3/uL (ref 4.0–10.5)
nRBC: 0 % (ref 0.0–0.2)

## 2021-08-12 LAB — COMPREHENSIVE METABOLIC PANEL
ALT: 32 U/L (ref 0–44)
AST: 30 U/L (ref 15–41)
Albumin: 2.8 g/dL — ABNORMAL LOW (ref 3.5–5.0)
Alkaline Phosphatase: 126 U/L (ref 38–126)
Anion gap: 5 (ref 5–15)
BUN: 17 mg/dL (ref 8–23)
CO2: 28 mmol/L (ref 22–32)
Calcium: 9.4 mg/dL (ref 8.9–10.3)
Chloride: 105 mmol/L (ref 98–111)
Creatinine, Ser: 0.77 mg/dL (ref 0.61–1.24)
GFR, Estimated: >60 mL/min (ref >60)
Glucose, Bld: 122 mg/dL — ABNORMAL HIGH (ref 70–99)
Potassium: 4.1 mmol/L (ref 3.5–5.1)
Sodium: 138 mmol/L (ref 135–145)
Total Bilirubin: 0.3 mg/dL (ref 0.3–1.2)
Total Protein: 6.1 g/dL — ABNORMAL LOW (ref 6.5–8.1)

## 2021-08-12 LAB — URINALYSIS, ROUTINE W REFLEX MICROSCOPIC
Bilirubin Urine: NEGATIVE
Glucose, UA: NEGATIVE mg/dL
Ketones, ur: NEGATIVE mg/dL
Nitrite: NEGATIVE
Protein, ur: NEGATIVE mg/dL
Specific Gravity, Urine: 1.01 (ref 1.005–1.030)
WBC, UA: >50 WBC/hpf — ABNORMAL HIGH (ref 0–5)
pH: 6 (ref 5.0–8.0)

## 2021-08-12 LAB — LACTIC ACID, PLASMA
Lactic Acid, Venous: 1.7 mmol/L (ref 0.5–1.9)
Lactic Acid, Venous: 1.8 mmol/L (ref 0.5–1.9)

## 2021-08-12 LAB — PROTIME-INR
INR: 1.1 (ref 0.8–1.2)
Prothrombin Time: 14.2 seconds (ref 11.4–15.2)

## 2021-08-12 LAB — RESP PANEL BY RT-PCR (FLU A&B, COVID) ARPGX2
Influenza A by PCR: NEGATIVE
Influenza B by PCR: NEGATIVE
SARS Coronavirus 2 by RT PCR: NEGATIVE

## 2021-08-12 LAB — APTT: aPTT: 36 seconds (ref 24–36)

## 2021-08-12 NOTE — Discharge Instructions (Addendum)
You were seen today for ongoing confusion and delirium.  Your work-up today is largely reassuring.  You are chronically colonized because of your urinary catheter.  Urine culture is pending but you have no other evidence of infection at this time. ?

## 2021-08-12 NOTE — ED Notes (Signed)
PTAR Called 

## 2021-08-14 LAB — URINE CULTURE: Culture: >=100000 — AB

## 2021-08-15 NOTE — Progress Notes (Signed)
ED Antimicrobial Stewardship Positive Culture Follow Up   Todd Dunn is an 81 y.o. male who presented to [facility] on 08/11/2021 with a chief complaint of  Chief Complaint  Patient presents with   Altered Mental Status    Pt arrive by EMS from [facility] for c/o been more alter than usual recently diagnosed with UTI. Foley cathter from SNF. Pt getting Hydrocodone for pain relief on EMS arrival pt just got his pain medication.    Recent Results (from the past 720 hour(s))  Resp Panel by RT-PCR (Flu A&B, Covid) Nasopharyngeal Swab     Status: None   Collection Time: 07/29/21 10:00 PM   Specimen: Nasopharyngeal Swab; Nasopharyngeal(NP) swabs in vial transport medium  Result Value Ref Range Status   SARS Coronavirus 2 by RT PCR NEGATIVE NEGATIVE Final    Comment: (NOTE) SARS-CoV-2 target nucleic acids are NOT DETECTED.  The SARS-CoV-2 RNA is generally detectable in upper respiratory specimens during the acute phase of infection. The lowest concentration of SARS-CoV-2 viral copies this assay can detect is 138 copies/mL. A negative result does not preclude SARS-Cov-2 infection and should not be used as the sole basis for treatment or other patient management decisions. A negative result may occur with  improper specimen collection/handling, submission of specimen other than nasopharyngeal swab, presence of viral mutation(s) within the areas targeted by this assay, and inadequate number of viral copies(<138 copies/mL). A negative result must be combined with clinical observations, patient history, and epidemiological information. The expected result is Negative.  Fact Sheet for Patients:  EntrepreneurPulse.com.au  Fact Sheet for Healthcare Providers:  IncredibleEmployment.be  This test is no t yet approved or cleared by the [place] FDA and  has been authorized for detection and/or diagnosis of SARS-CoV-2 by FDA under an  Emergency Use Authorization (EUA). This EUA will remain  in effect (meaning this test can be used) for the duration of the COVID-19 declaration under Section 564(b)(1) of the Act, 21 U.S.C.section 360bbb-3(b)(1), unless the authorization is terminated  or revoked sooner.       Influenza A by PCR NEGATIVE NEGATIVE Final   Influenza B by PCR NEGATIVE NEGATIVE Final    Comment: (NOTE) The Xpert Xpress SARS-CoV-2/FLU/RSV plus assay is intended as an aid in the diagnosis of influenza from Nasopharyngeal swab specimens and should not be used as a sole basis for treatment. Nasal washings and aspirates are unacceptable for Xpert Xpress SARS-CoV-2/FLU/RSV testing.  Fact Sheet for Patients: EntrepreneurPulse.com.au  Fact Sheet for Healthcare Providers: IncredibleEmployment.be  This test is not yet approved or cleared by the [place] FDA and has been authorized for detection and/or diagnosis of SARS-CoV-2 by FDA under an Emergency Use Authorization (EUA). This EUA will remain in effect (meaning this test can be used) for the duration of the COVID-19 declaration under Section 564(b)(1) of the Act, 21 U.S.C. section 360bbb-3(b)(1), unless the authorization is terminated or revoked.  Performed at [facility], [place] [address], [place], [place] [address]   Surgical PCR screen     Status: None   Collection Time: 07/31/21  1:07 AM   Specimen: Nasal Mucosa; Nasal Swab  Result Value Ref Range Status   MRSA, PCR NEGATIVE NEGATIVE Final   Staphylococcus aureus NEGATIVE NEGATIVE Final    Comment: (NOTE) The Xpert SA Assay (FDA approved for NASAL specimens in patients 10 years of age and older), is one component of a comprehensive surveillance program. It is not intended to diagnose infection  nor to guide or monitor treatment. Performed at [facility], [place] [address], [place], [place] [address]   Resp Panel by RT-PCR (Flu A&B, Covid)  Nasopharyngeal Swab     Status: None   Collection Time: 08/04/21 10:20 AM   Specimen: Nasopharyngeal Swab; Nasopharyngeal(NP) swabs in vial transport medium  Result Value Ref Range Status   SARS Coronavirus 2 by RT PCR NEGATIVE NEGATIVE Final    Comment: (NOTE) SARS-CoV-2 target nucleic acids are NOT DETECTED.  The SARS-CoV-2 RNA is generally detectable in upper respiratory specimens during the acute phase of infection. The lowest concentration of SARS-CoV-2 viral copies this assay can detect is 138 copies/mL. A negative result does not preclude SARS-Cov-2 infection and should not be used as the sole basis for treatment or other patient management decisions. A negative result may occur with  improper specimen collection/handling, submission of specimen other than nasopharyngeal swab, presence of viral mutation(s) within the areas targeted by this assay, and inadequate number of viral copies(<138 copies/mL). A negative result must be combined with clinical observations, patient history, and epidemiological information. The expected result is Negative.  Fact Sheet for Patients:  EntrepreneurPulse.com.au  Fact Sheet for Healthcare Providers:  IncredibleEmployment.be  This test is no t yet approved or cleared by the [place] FDA and  has been authorized for detection and/or diagnosis of SARS-CoV-2 by FDA under an Emergency Use Authorization (EUA). This EUA will remain  in effect (meaning this test can be used) for the duration of the COVID-19 declaration under Section 564(b)(1) of the Act, 21 U.S.C.section 360bbb-3(b)(1), unless the authorization is terminated  or revoked sooner.       Influenza A by PCR NEGATIVE NEGATIVE Final   Influenza B by PCR NEGATIVE NEGATIVE Final    Comment: (NOTE) The Xpert Xpress SARS-CoV-2/FLU/RSV plus assay is intended as an aid in the diagnosis of influenza from Nasopharyngeal swab specimens and should not be  used as a sole basis for treatment. Nasal washings and aspirates are unacceptable for Xpert Xpress SARS-CoV-2/FLU/RSV testing.  Fact Sheet for Patients: EntrepreneurPulse.com.au  Fact Sheet for Healthcare Providers: IncredibleEmployment.be  This test is not yet approved or cleared by the [place] FDA and has been authorized for detection and/or diagnosis of SARS-CoV-2 by FDA under an Emergency Use Authorization (EUA). This EUA will remain in effect (meaning this test can be used) for the duration of the COVID-19 declaration under Section 564(b)(1) of the Act, 21 U.S.C. section 360bbb-3(b)(1), unless the authorization is terminated or revoked.  Performed at [facility], [place] [address] [place], [place] [address]   Resp Panel by RT-PCR (Flu A&B, Covid) Nasopharyngeal Swab     Status: None   Collection Time: 08/06/21  9:15 AM   Specimen: Nasopharyngeal Swab; Nasopharyngeal(NP) swabs in vial transport medium  Result Value Ref Range Status   SARS Coronavirus 2 by RT PCR NEGATIVE NEGATIVE Final    Comment: (NOTE) SARS-CoV-2 target nucleic acids are NOT DETECTED.  The SARS-CoV-2 RNA is generally detectable in upper respiratory specimens during the acute phase of infection. The lowest concentration of SARS-CoV-2 viral copies this assay can detect is 138 copies/mL. A negative result does not preclude SARS-Cov-2 infection and should not be used as the sole basis for treatment or other patient management decisions. A negative result may occur with  improper specimen collection/handling, submission of specimen other than nasopharyngeal swab, presence of viral mutation(s) within the areas targeted by this assay, and inadequate number of viral copies(<138 copies/mL).  A negative result must be combined with clinical observations, patient history, and epidemiological information. The expected result is Negative.  Fact Sheet for Patients:   EntrepreneurPulse.com.au  Fact Sheet for Healthcare Providers:  IncredibleEmployment.be  This test is no t yet approved or cleared by the [place] FDA and  has been authorized for detection and/or diagnosis of SARS-CoV-2 by FDA under an Emergency Use Authorization (EUA). This EUA will remain  in effect (meaning this test can be used) for the duration of the COVID-19 declaration under Section 564(b)(1) of the Act, 21 U.S.C.section 360bbb-3(b)(1), unless the authorization is terminated  or revoked sooner.       Influenza A by PCR NEGATIVE NEGATIVE Final   Influenza B by PCR NEGATIVE NEGATIVE Final    Comment: (NOTE) The Xpert Xpress SARS-CoV-2/FLU/RSV plus assay is intended as an aid in the diagnosis of influenza from Nasopharyngeal swab specimens and should not be used as a sole basis for treatment. Nasal washings and aspirates are unacceptable for Xpert Xpress SARS-CoV-2/FLU/RSV testing.  Fact Sheet for Patients: EntrepreneurPulse.com.au  Fact Sheet for Healthcare Providers: IncredibleEmployment.be  This test is not yet approved or cleared by the [place] FDA and has been authorized for detection and/or diagnosis of SARS-CoV-2 by FDA under an Emergency Use Authorization (EUA). This EUA will remain in effect (meaning this test can be used) for the duration of the COVID-19 declaration under Section 564(b)(1) of the Act, 21 U.S.C. section 360bbb-3(b)(1), unless the authorization is terminated or revoked.  Performed at [facility], [place] [address], [place], [place] [address]   Blood Culture (routine x 2)     Status: None (Preliminary result)   Collection Time: 08/11/21  8:00 PM   Specimen: BLOOD LEFT ARM  Result Value Ref Range Status   Specimen Description BLOOD LEFT ARM  Final   Special Requests   Final    BOTTLES DRAWN AEROBIC ONLY Blood Culture adequate volume   Culture    Final    NO GROWTH 3 DAYS Performed at [facility], [place] [address] [place], [place] [address]    Report Status PENDING  Incomplete  Blood Culture (routine x 2)     Status: None (Preliminary result)   Collection Time: 08/11/21 11:50 PM   Specimen: BLOOD RIGHT FOREARM  Result Value Ref Range Status   Specimen Description BLOOD RIGHT FOREARM  Final   Special Requests   Final    BOTTLES DRAWN AEROBIC AND ANAEROBIC Blood Culture adequate volume   Culture   Final    NO GROWTH 3 DAYS Performed at [facility], [place] [address], [place], [place] [address]    Report Status PENDING  Incomplete  Urine Culture     Status: Abnormal   Collection Time: 08/12/21 12:07 AM   Specimen: In/Out Cath Urine  Result Value Ref Range Status   Specimen Description IN/OUT CATH URINE  Final   Special Requests   Final    NONE Performed at [facility], [place] [address], [place], [place] [address]    Culture >=100,000 COLONIES/mL PSEUDOMONAS AERUGINOSA (A)  Final   Report Status 08/14/2021 FINAL  Final   Organism ID, Bacteria PSEUDOMONAS AERUGINOSA (A)  Final      Susceptibility   Pseudomonas aeruginosa - MIC*    CEFTAZIDIME 2 SENSITIVE Sensitive     CIPROFLOXACIN <=0.25 SENSITIVE Sensitive     GENTAMICIN <=1 SENSITIVE Sensitive     IMIPENEM 2 SENSITIVE Sensitive  PIP/TAZO 8 SENSITIVE Sensitive     CEFEPIME 2 SENSITIVE Sensitive     * >=100,000 COLONIES/mL PSEUDOMONAS AERUGINOSA  Resp Panel by RT-PCR (Flu A&B, Covid) Nasopharyngeal Swab     Status: None   Collection Time: 08/12/21 12:07 AM   Specimen: Nasopharyngeal Swab; Nasopharyngeal(NP) swabs in vial transport medium  Result Value Ref Range Status   SARS Coronavirus 2 by RT PCR NEGATIVE NEGATIVE Final    Comment: (NOTE) SARS-CoV-2 target nucleic acids are NOT DETECTED.  The SARS-CoV-2 RNA is generally detectable in upper respiratory specimens during the acute phase of infection. The lowest concentration of SARS-CoV-2 viral  copies this assay can detect is 138 copies/mL. A negative result does not preclude SARS-Cov-2 infection and should not be used as the sole basis for treatment or other patient management decisions. A negative result may occur with  improper specimen collection/handling, submission of specimen other than nasopharyngeal swab, presence of viral mutation(s) within the areas targeted by this assay, and inadequate number of viral copies(<138 copies/mL). A negative result must be combined with clinical observations, patient history, and epidemiological information. The expected result is Negative.  Fact Sheet for Patients:  EntrepreneurPulse.com.au  Fact Sheet for Healthcare Providers:  IncredibleEmployment.be  This test is no t yet approved or cleared by the [place] FDA and  has been authorized for detection and/or diagnosis of SARS-CoV-2 by FDA under an Emergency Use Authorization (EUA). This EUA will remain  in effect (meaning this test can be used) for the duration of the COVID-19 declaration under Section 564(b)(1) of the Act, 21 U.S.C.section 360bbb-3(b)(1), unless the authorization is terminated  or revoked sooner.       Influenza A by PCR NEGATIVE NEGATIVE Final   Influenza B by PCR NEGATIVE NEGATIVE Final    Comment: (NOTE) The Xpert Xpress SARS-CoV-2/FLU/RSV plus assay is intended as an aid in the diagnosis of influenza from Nasopharyngeal swab specimens and should not be used as a sole basis for treatment. Nasal washings and aspirates are unacceptable for Xpert Xpress SARS-CoV-2/FLU/RSV testing.  Fact Sheet for Patients: EntrepreneurPulse.com.au  Fact Sheet for Healthcare Providers: IncredibleEmployment.be  This test is not yet approved or cleared by the [place] FDA and has been authorized for detection and/or diagnosis of SARS-CoV-2 by FDA under an Emergency Use Authorization (EUA). This  EUA will remain in effect (meaning this test can be used) for the duration of the COVID-19 declaration under Section 564(b)(1) of the Act, 21 U.S.C. section 360bbb-3(b)(1), unless the authorization is terminated or revoked.  Performed at [facility], [place] [address], [place], [place] [address]     Per ED Pickering's request I have notified [facility] of results. Spoke with RN Olivia Mackie (in the 100 hall) there. Will FAX results to [phone]) per her request. No further action required.  ED Provider: Tinnie Gens PA   Lorelei Pont, PharmD, BCPS 08/15/2021 11:11 AM ED Clinical Pharmacist -  [phone]

## 2021-08-15 NOTE — Telephone Encounter (Signed)
Post ED Visit - Positive Culture Follow-up ? ?Culture report reviewed by antimicrobial stewardship pharmacist: ?[place] Team ?'[]'$  Elenor Quinones, Pharm.D. ?'[]'$  Heide Guile, Pharm.D., BCPS AQ-ID ?'[]'$  Parks Neptune, Pharm.D., BCPS ?'[]'$  Alycia Rossetti, Pharm.D., BCPS ?'[]'$  [place], Pharm.D., BCPS, AAHIVP ?'[]'$  Legrand Como, Pharm.D., BCPS, AAHIVP ?'[]'$  Salome Arnt, PharmD, BCPS ?'[]'$  Johnnette Gourd, PharmD, BCPS ?'[]'$  Hughes Better, PharmD, BCPS ?'[]'$  Leeroy Cha, PharmD ?'[]'$  Laqueta Linden, PharmD, BCPS ?'[x]'$  Lorelei Pont PharmD ? ?[place] Team ?'[]'$  Leodis Sias, PharmD ?'[]'$  Lindell Spar, PharmD ?'[]'$  Royetta Asal, PharmD ?'[]'$  Graylin Shiver, Rph ?'[]'$  Rema Fendt) Glennon Mac, PharmD ?'[]'$  Arlyn Dunning, PharmD ?'[]'$  Netta Cedars, PharmD ?'[]'$  Dia Sitter, PharmD ?'[]'$  Leone Haven, PharmD ?'[]'$  Gretta Arab, PharmD ?'[]'$  Theodis Shove, PharmD ?'[]'$  Peggyann Juba, PharmD ?'[]'$  Reuel Boom, PharmD ? ? ?Positive urine culture ?Treated with Cipro, organism sensitive to the same and no further patient follow-up is required at this time. ? ?Todd Dunn ?08/15/2021, 1:40 PM ?  ?

## 2021-08-17 LAB — CULTURE, BLOOD (ROUTINE X 2)
Culture: NO GROWTH
Culture: NO GROWTH
Special Requests: ADEQUATE
Special Requests: ADEQUATE

## 2021-08-24 ENCOUNTER — Telehealth: Payer: Self-pay | Admitting: Nurse Practitioner

## 2021-08-24 NOTE — Telephone Encounter (Signed)
Home Health Verbal Orders - Caller/Agency: Kayren Eaves [facility] ? ?Callback Number: [phone] ? ?Requesting OT/PT/Skilled Nursing/Social Work/Speech Therapy: PT ? ?Frequency: 2 times a week 4 weeks and 1 time a week for 1 week  ?

## 2021-08-24 NOTE — Telephone Encounter (Signed)
Will forward to nurse 

## 2021-08-25 ENCOUNTER — Telehealth: Payer: Self-pay

## 2021-08-25 NOTE — Telephone Encounter (Signed)
Called Gerald Stabs and gave verbal approval. ?

## 2021-08-28 ENCOUNTER — Emergency Department (HOSPITAL_COMMUNITY)
Admission: EM | Admit: 2021-08-28 | Discharge: 2021-08-29 | Disposition: A | Discharge status: Home / Self Care | Payer: Medicare Other | Attending: Emergency Medicine | Admitting: Emergency Medicine

## 2021-08-28 DIAGNOSIS — Z79899 Other long term (current) drug therapy: Secondary | ICD-10-CM | POA: Insufficient documentation

## 2021-08-28 DIAGNOSIS — Z89611 Acquired absence of right leg above knee: Secondary | ICD-10-CM | POA: Insufficient documentation

## 2021-08-28 DIAGNOSIS — M79604 Pain in right leg: Secondary | ICD-10-CM | POA: Insufficient documentation

## 2021-08-28 DIAGNOSIS — I2510 Atherosclerotic heart disease of native coronary artery without angina pectoris: Secondary | ICD-10-CM | POA: Diagnosis not present

## 2021-08-28 DIAGNOSIS — Z7901 Long term (current) use of anticoagulants: Secondary | ICD-10-CM | POA: Diagnosis not present

## 2021-08-28 DIAGNOSIS — R309 Painful micturition, unspecified: Secondary | ICD-10-CM | POA: Diagnosis not present

## 2021-08-28 DIAGNOSIS — I10 Essential (primary) hypertension: Secondary | ICD-10-CM | POA: Diagnosis not present

## 2021-08-28 DIAGNOSIS — Z7902 Long term (current) use of antithrombotics/antiplatelets: Secondary | ICD-10-CM | POA: Insufficient documentation

## 2021-08-28 DIAGNOSIS — G8918 Other acute postprocedural pain: Secondary | ICD-10-CM | POA: Insufficient documentation

## 2021-08-28 DIAGNOSIS — I739 Peripheral vascular disease, unspecified: Secondary | ICD-10-CM | POA: Insufficient documentation

## 2021-08-28 LAB — CBC WITH DIFFERENTIAL/PLATELET
Abs Immature Granulocytes: 0.01 10*3/uL (ref 0.00–0.07)
Basophils Absolute: 0 10*3/uL (ref 0.0–0.1)
Basophils Relative: 1 %
Eosinophils Absolute: 0.1 10*3/uL (ref 0.0–0.5)
Eosinophils Relative: 2 %
HCT: 37.8 % — ABNORMAL LOW (ref 39.0–52.0)
Hemoglobin: 11.4 g/dL — ABNORMAL LOW (ref 13.0–17.0)
Immature Granulocytes: 0 %
Lymphocytes Relative: 37 %
Lymphs Abs: 1.7 10*3/uL (ref 0.7–4.0)
MCH: 26.4 pg (ref 26.0–34.0)
MCHC: 30.2 g/dL (ref 30.0–36.0)
MCV: 87.5 fL (ref 80.0–100.0)
Monocytes Absolute: 0.4 10*3/uL (ref 0.1–1.0)
Monocytes Relative: 9 %
Neutro Abs: 2.3 10*3/uL (ref 1.7–7.7)
Neutrophils Relative %: 51 %
Platelets: 182 10*3/uL (ref 150–400)
RBC: 4.32 MIL/uL (ref 4.22–5.81)
RDW: 19.9 % — ABNORMAL HIGH (ref 11.5–15.5)
WBC: 4.5 10*3/uL (ref 4.0–10.5)
nRBC: 0 % (ref 0.0–0.2)

## 2021-08-28 LAB — BASIC METABOLIC PANEL
Anion gap: 4 — ABNORMAL LOW (ref 5–15)
BUN: 19 mg/dL (ref 8–23)
CO2: 27 mmol/L (ref 22–32)
Calcium: 9.5 mg/dL (ref 8.9–10.3)
Chloride: 105 mmol/L (ref 98–111)
Creatinine, Ser: 0.8 mg/dL (ref 0.61–1.24)
GFR, Estimated: >60 mL/min (ref >60)
Glucose, Bld: 159 mg/dL — ABNORMAL HIGH (ref 70–99)
Potassium: 3.1 mmol/L — ABNORMAL LOW (ref 3.5–5.1)
Sodium: 136 mmol/L (ref 135–145)

## 2021-08-28 NOTE — ED Triage Notes (Signed)
Pt arrive POV c/o left leg pain, poor historian. ?

## 2021-08-28 NOTE — ED Provider Triage Note (Signed)
Emergency Medicine Provider Triage Evaluation Note ? ?Todd Dunn , a 81 y.o. male  was evaluated in triage.  Pt complains of right leg pain.  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  He is a very poor historian had multiple ED visits and hospitalizations at this hospital.  It is unclear whether this patient care from home versus a nursing facility.  Previous notes states that he was from a nursing facility, however he did not come via EMS today and it looks like there were some telephone notes and about getting him set up with home health, so he may not be a nursing facility any longer.  He does not have any family with him.  He answers no to all ROS questions.  He does not look to be in any acute distress. ? ?Review of Systems  ?Positive: Right leg pain ?Negative:  ? ?Physical Exam  ?BP (!) 110/51 (BP Location: Right Arm)   Pulse (!) 47   Temp 98.3 ?F (36.8 ?C) (Oral)   Resp 20   SpO2 98%  ?Gen:   Awake, no distress   ?Resp:  Normal effort  ?MSK:   Moves extremities without difficulty  ?Other:  Right AKA, Healing well. No redness or drainage. Patient keeps pointing at his right leg hurting when asked why he is here. He is hard of hearing. ? ?Medical Decision Making  ?Medically screening exam initiated at 7:56 PM.  Appropriate orders placed.  Todd Dunn was informed that the remainder of the evaluation will be completed by another provider, this initial triage assessment does not replace that evaluation, and the importance of remaining in the ED until their evaluation is complete. ? ?Unsure why this patient is here other than complaint of right limb pain. He will need collateral information to be obtained.  ?  ?Todd Birchwood, PA-C ?08/28/21 1958 ? ?

## 2021-08-28 NOTE — ED Notes (Signed)
Patient moved to recliner for patient safety and comfort ?

## 2021-08-29 ENCOUNTER — Other Ambulatory Visit: Payer: Self-pay

## 2021-08-29 ENCOUNTER — Emergency Department (HOSPITAL_COMMUNITY): Payer: Medicare Other

## 2021-08-29 ENCOUNTER — Emergency Department (HOSPITAL_COMMUNITY)
Admission: EM | Admit: 2021-08-29 | Discharge: 2021-08-29 | Disposition: A | Discharge status: Home / Self Care | Payer: Medicare Other | Source: Home / Self Care | Attending: Emergency Medicine | Admitting: Emergency Medicine

## 2021-08-29 ENCOUNTER — Encounter (HOSPITAL_COMMUNITY): Payer: Self-pay | Admitting: Emergency Medicine

## 2021-08-29 DIAGNOSIS — Z7902 Long term (current) use of antithrombotics/antiplatelets: Secondary | ICD-10-CM | POA: Insufficient documentation

## 2021-08-29 DIAGNOSIS — I2510 Atherosclerotic heart disease of native coronary artery without angina pectoris: Secondary | ICD-10-CM | POA: Insufficient documentation

## 2021-08-29 DIAGNOSIS — Z79899 Other long term (current) drug therapy: Secondary | ICD-10-CM | POA: Insufficient documentation

## 2021-08-29 DIAGNOSIS — I739 Peripheral vascular disease, unspecified: Secondary | ICD-10-CM | POA: Insufficient documentation

## 2021-08-29 DIAGNOSIS — M79604 Pain in right leg: Secondary | ICD-10-CM | POA: Diagnosis not present

## 2021-08-29 DIAGNOSIS — R309 Painful micturition, unspecified: Secondary | ICD-10-CM | POA: Insufficient documentation

## 2021-08-29 DIAGNOSIS — I10 Essential (primary) hypertension: Secondary | ICD-10-CM | POA: Insufficient documentation

## 2021-08-29 DIAGNOSIS — R301 Vesical tenesmus: Secondary | ICD-10-CM

## 2021-08-29 LAB — COMPREHENSIVE METABOLIC PANEL
ALT: 14 U/L (ref 0–44)
AST: 19 U/L (ref 15–41)
Albumin: 3.1 g/dL — ABNORMAL LOW (ref 3.5–5.0)
Alkaline Phosphatase: 139 U/L — ABNORMAL HIGH (ref 38–126)
Anion gap: 3 — ABNORMAL LOW (ref 5–15)
BUN: 11 mg/dL (ref 8–23)
CO2: 30 mmol/L (ref 22–32)
Calcium: 9.7 mg/dL (ref 8.9–10.3)
Chloride: 107 mmol/L (ref 98–111)
Creatinine, Ser: 0.63 mg/dL (ref 0.61–1.24)
GFR, Estimated: >60 mL/min (ref >60)
Glucose, Bld: 120 mg/dL — ABNORMAL HIGH (ref 70–99)
Potassium: 3.3 mmol/L — ABNORMAL LOW (ref 3.5–5.1)
Sodium: 140 mmol/L (ref 135–145)
Total Bilirubin: 0.6 mg/dL (ref 0.3–1.2)
Total Protein: 6.1 g/dL — ABNORMAL LOW (ref 6.5–8.1)

## 2021-08-29 LAB — CBC
HCT: 40.5 % (ref 39.0–52.0)
Hemoglobin: 12.4 g/dL — ABNORMAL LOW (ref 13.0–17.0)
MCH: 26.7 pg (ref 26.0–34.0)
MCHC: 30.6 g/dL (ref 30.0–36.0)
MCV: 87.1 fL (ref 80.0–100.0)
Platelets: UNDETERMINED 10*3/uL (ref 150–400)
RBC: 4.65 MIL/uL (ref 4.22–5.81)
RDW: 19.9 % — ABNORMAL HIGH (ref 11.5–15.5)
WBC: 3.9 10*3/uL — ABNORMAL LOW (ref 4.0–10.5)
nRBC: 0 % (ref 0.0–0.2)

## 2021-08-29 LAB — URINALYSIS, ROUTINE W REFLEX MICROSCOPIC
Bilirubin Urine: NEGATIVE
Glucose, UA: NEGATIVE mg/dL
Ketones, ur: NEGATIVE mg/dL
Nitrite: NEGATIVE
Protein, ur: NEGATIVE mg/dL
Specific Gravity, Urine: 1.009 (ref 1.005–1.030)
pH: 8 (ref 5.0–8.0)

## 2021-08-29 LAB — LIPASE, BLOOD: Lipase: 32 U/L (ref 11–51)

## 2021-08-29 MED ORDER — IOHEXOL 350 MG/ML SOLN
100.0000 mL | Freq: Once | INTRAVENOUS | Status: AC | PRN
  Administered 2021-08-29: 100 mL via INTRAVENOUS

## 2021-08-29 MED ORDER — OXYBUTYNIN CHLORIDE 5 MG PO TABS: 5.0000 mg | ORAL_TABLET | Freq: Three times a day (TID) | ORAL | 1 refills | Status: DC | PRN

## 2021-08-29 NOTE — ED Provider Notes (Signed)
?Todd Dunn ?Provider Note ? ? ?CSN: [id number] ?Arrival date & time: 08/29/21  0820 ? ?  ? ?History ? ?Chief Complaint  ?Patient presents with  ? Abdominal Pain  ? Leg Pain  ? ? ?Todd Dunn is a 81 y.o. male. ? ?Patient is an 81 year old male with a history of neurogenic bladder status post recent suprapubic catheter placement, CVA, CAD, sick sinus syndrome status post ICD, hypertension, recent right-sided BKA who has recently been discharged home after several weeks in rehab who is presenting to the emergency room tonight with his family due to complaints of pain in his left leg than on amputated side as well as pain in the bladder with urination.  Patient's granddaughter is present at bedside and is the one caring for him at this time.  She reports over the last 24 hours she had noticed significant amount of blood in his suprapubic catheter which concerned her that he might have a urinary tract infection.  Since his discharge from the hospital he seems to be getting bladder spasms and he rolls around in pain and then urine comes out of the catheter.  She tried AZO to see if that would help with the spasm and he does have hydrocodone which he has been taking intermittently for the pain but it does not seem to be getting better.  He does complain of pain in his left leg intermittently but because he has had frequent falls in the past she was concerned maybe he had injured it.  Patient was seen in the emergency room but she was at work and was unable to come back with him and she was not sure that they addressed all of the issues.  While he was here he did have a CTA of his lower extremities with runoff and at that time was found to have peripheral artery disease but did have flow down to the foot via a single artery.  He also had labs done which were reassuring but did not have a urine test done.  Patient has known colonization with Pseudomonas and Klebsiella.  He had a  culture done at the beginning of March which did show positive for Pseudomonas which was pansensitive.  However patient has not had any nausea vomiting or fevers at home. ? ?The history is provided by medical records and a caregiver.  ?Abdominal Pain ?Leg Pain ? ?  ? ?Home Medications ?Prior to Admission medications   ?Medication Sig Start Date End Date Taking? Authorizing Provider  ?oxybutynin (DITROPAN) 5 MG tablet Take 1 tablet (5 mg total) by mouth every 8 (eight) hours as needed for bladder spasms. 08/29/21  Yes Blanchie Dessert, MD  ?albuterol (VENTOLIN HFA) 108 (90 Base) MCG/ACT inhaler Inhale 2 puffs into the lungs every 6 (six) hours as needed for wheezing or shortness of breath. 06/24/21   Terrilee Croak, MD  ?amLODipine (NORVASC) 10 MG tablet Take 1 tablet (10 mg total) by mouth daily. 07/17/21   [name], NP  ?cephALEXin (KEFLEX) 500 MG capsule Take 1 capsule (500 mg total) by mouth 3 (three) times daily. ?Patient taking differently: Take 500 mg by mouth See admin instructions. Tid x 10 days 09/14/79   Delora Fuel, MD  ?clopidogrel (PLAVIX) 75 MG tablet Take 1 tablet (75 mg total) by mouth at bedtime. 07/17/21   [name], NP  ?divalproex (DEPAKOTE) 125 MG DR tablet Take 1 tablet (125 mg total) by mouth at bedtime. 07/17/21 10/15/21  Raul Del,  Vernia Buff, NP  ?finasteride (PROSCAR) 5 MG tablet Take 1 tablet (5 mg total) by mouth every morning. 06/24/21 09/22/21  Terrilee Croak, MD  ?HYDROcodone-acetaminophen (NORCO) 5-325 MG tablet Take 1 tablet by mouth every 6 (six) hours as needed for moderate pain. ?Patient taking differently: Take 1 tablet by mouth every 8 (eight) hours. 08/04/21   Dessa Phi, DO  ?HYDROcodone-acetaminophen (NORCO/VICODIN) 5-325 MG tablet Take 1 tablet by mouth every 6 (six) hours as needed for moderate pain.    [provider]  ?LORazepam (ATIVAN) 0.5 MG tablet Take 0.5 mg by mouth See admin instructions. Every 6 hours as needed for anxiety x 14 days    [provider]  ?nitroGLYCERIN (NITROSTAT) 0.4 MG SL tablet Place 0.4 mg under the tongue every 5 (five) minutes x 3 doses as needed for chest pain.    [provider]  ?pantoprazole (PROTONIX) 40 MG tablet TAKE 1 TABLET(40 MG) BY MOUTH DAILY ?Patient taking differently: Take 40 mg by mouth daily. 07/21/21   Charlott Rakes, MD  ?polyethylene glycol (MIRALAX / GLYCOLAX) 17 g packet Take 17 g by mouth daily as needed for mild constipation. 07/17/21   [name], NP  ?rosuvastatin (CRESTOR) 10 MG tablet Take 1 tablet (10 mg total) by mouth every morning. 07/17/21   [name], NP  ?vitamin B-12 (CYANOCOBALAMIN) 1000 MCG tablet Take 1 tablet (1,000 mcg total) by mouth daily. 07/17/21   [name], NP  ?Vitamin D, Ergocalciferol, (DRISDOL) 1.25 MG (50000 UNIT) CAPS capsule Take 50,000 Units by mouth every Monday.    [provider]  ?   ? ?Allergies    ?Food, Aspirin, and Northern quahog clam (m. mercenaria) skin test   ? ?Review of Systems   ?Review of Systems  ?Gastrointestinal:  Positive for abdominal pain.  ? ?Physical Exam ?Updated Vital Signs ?BP 120/83 (BP Location: Right Arm)   Pulse 77   Temp 97.8 ?F (36.6 ?C) (Oral)   Resp 16   SpO2 96%  ?Physical Exam ?Vitals and nursing note reviewed.  ?Constitutional:   ?   General: He is not in acute distress. ?   Appearance: He is well-developed.  ?HENT:  ?   Head: Normocephalic and atraumatic.  ?Eyes:  ?   Conjunctiva/sclera: Conjunctivae normal.  ?   Pupils: Pupils are equal, round, and reactive to light.  ?Cardiovascular:  ?   Rate and Rhythm: Normal rate and regular rhythm.  ?   Heart sounds: No murmur heard. ?Pulmonary:  ?   Effort: Pulmonary effort is normal. No respiratory distress.  ?   Breath sounds: Normal breath sounds. No wheezing or rales.  ?Abdominal:  ?   General: There is no distension.  ?   Palpations: Abdomen is soft.  ?   Tenderness: There is no abdominal tenderness. There is no guarding or rebound.  ?   Comments:  Suprapubic catheter present, no drainage around the tube.  Clear urine in the bag  ?Musculoskeletal:     ?   General: No tenderness. Normal range of motion.  ?   Cervical back: Normal range of motion and neck supple.  ?   Comments: Recent right BKA with staples in place.  Surgical site appears to be healing well without any erythema, drainage or discomfort with palpation.  The left lower extremity is warm to the knee with a palpable left femoral and popliteal pulse however then the leg is cold from the knee down.  Less than 3-second  capillary refill in all toes, no wounds are present  ?Skin: ?   General: Skin is warm and dry.  ?   Findings: No erythema or rash.  ?Neurological:  ?   Mental Status: He is alert. Mental status is at baseline.  ?   Comments: Patient will answer some questions but does have difficulty most of the time and is hard to understand.  He can follow commands.  Seems to be moving the upper extremities without difficulty and his left lower leg  ?Psychiatric:     ?   Behavior: Behavior normal.  ? ? ?ED Results / Procedures / Treatments   ?Labs ?(all labs ordered are listed, but only abnormal results are displayed) ?Labs Reviewed  ?COMPREHENSIVE METABOLIC PANEL - Abnormal; Notable for the following components:  ?    Result Value  ? Potassium 3.3 (*)   ? Glucose, Bld 120 (*)   ? Total Protein 6.1 (*)   ? Albumin 3.1 (*)   ? Alkaline Phosphatase 139 (*)   ? Anion gap 3 (*)   ? All other components within normal limits  ?CBC - Abnormal; Notable for the following components:  ? WBC 3.9 (*)   ? Hemoglobin 12.4 (*)   ? RDW 19.9 (*)   ? All other components within normal limits  ?URINALYSIS, ROUTINE W REFLEX MICROSCOPIC - Abnormal; Notable for the following components:  ? Hgb urine dipstick MODERATE (*)   ? Leukocytes,Ua SMALL (*)   ? Bacteria, UA RARE (*)   ? All other components within normal limits  ?LIPASE, BLOOD  ? ? ?EKG ?None ? ?Radiology ?CT Angio Aortobifemoral W and/or Wo Contrast ? ?Result Date:  08/29/2021 ?CLINICAL DATA:  History of leg pain, recent AKA for right lower extremity ischemia EXAM: CT ANGIOGRAPHY OF ABDOMINAL AORTA WITH ILIOFEMORAL RUNOFF TECHNIQUE: Multidetector CT imaging of the abdomen,

## 2021-08-29 NOTE — Discharge Instructions (Signed)
It appears that the pain you are experiencing is secondary to the recent surgery.  Schedule follow-up with Dr. Unk Lightning, your surgery in 2 weeks for a recheck and to have the staples removed. ?

## 2021-08-29 NOTE — ED Triage Notes (Signed)
Family concerned that pt has a UTI.  States he has had a suprapubic catheter in place since 3/1 and is having abd pain and burning. States he is pulling at catheter and is confused and has had 2 recent falls.  Pt has a R AKA with staples in place.  States he has R leg phantom pain but also having L leg pain from falls.  Seen in ED last night but family states his L leg and UTI symptoms were not evaluated because he is a poor historian. ?

## 2021-08-29 NOTE — ED Provider Notes (Signed)
?[place] ?Provider Note ? ? ?CSN: [id number] ?Arrival date & time: 08/28/21  1930 ? ?  ? ?History ? ?Chief Complaint  ?Patient presents with  ? Leg Pain  ? ? ?Todd Dunn is a 81 y.o. male. ? ?Presents to the emergency department with apparent leg pain.  Information provided partly by the patient but he is an extremely poor historian.  Patient keeps pointing to the stump of his right leg when stating that his leg hurts.  When asked if his left leg hurts, he then points to the foot and ankle, lower calf area. ? ? ?  ? ?Home Medications ?Prior to Admission medications   ?Medication Sig Start Date End Date Taking? Authorizing Provider  ?albuterol (VENTOLIN HFA) 108 (90 Base) MCG/ACT inhaler Inhale 2 puffs into the lungs every 6 (six) hours as needed for wheezing or shortness of breath. 06/24/21   Terrilee Croak, MD  ?amLODipine (NORVASC) 10 MG tablet Take 1 tablet (10 mg total) by mouth daily. 07/17/21   [name], NP  ?cephALEXin (KEFLEX) 500 MG capsule Take 1 capsule (500 mg total) by mouth 3 (three) times daily. ?Patient taking differently: Take 500 mg by mouth See admin instructions. Tid x 10 days 2/83/66   Delora Fuel, MD  ?clopidogrel (PLAVIX) 75 MG tablet Take 1 tablet (75 mg total) by mouth at bedtime. 07/17/21   [name], NP  ?divalproex (DEPAKOTE) 125 MG DR tablet Take 1 tablet (125 mg total) by mouth at bedtime. 07/17/21 10/15/21  [name], NP  ?finasteride (PROSCAR) 5 MG tablet Take 1 tablet (5 mg total) by mouth every morning. 06/24/21 09/22/21  Terrilee Croak, MD  ?HYDROcodone-acetaminophen (NORCO) 5-325 MG tablet Take 1 tablet by mouth every 6 (six) hours as needed for moderate pain. ?Patient taking differently: Take 1 tablet by mouth every 8 (eight) hours. 08/04/21   Dessa Phi, DO  ?HYDROcodone-acetaminophen (NORCO/VICODIN) 5-325 MG tablet Take 1 tablet by mouth every 6 (six) hours as needed for moderate pain.    [provider]   ?LORazepam (ATIVAN) 0.5 MG tablet Take 0.5 mg by mouth See admin instructions. Every 6 hours as needed for anxiety x 14 days    [provider]  ?nitroGLYCERIN (NITROSTAT) 0.4 MG SL tablet Place 0.4 mg under the tongue every 5 (five) minutes x 3 doses as needed for chest pain.    [provider]  ?pantoprazole (PROTONIX) 40 MG tablet TAKE 1 TABLET(40 MG) BY MOUTH DAILY ?Patient taking differently: Take 40 mg by mouth daily. 07/21/21   Charlott Rakes, MD  ?polyethylene glycol (MIRALAX / GLYCOLAX) 17 g packet Take 17 g by mouth daily as needed for mild constipation. 07/17/21   [name], NP  ?rosuvastatin (CRESTOR) 10 MG tablet Take 1 tablet (10 mg total) by mouth every morning. 07/17/21   [name], NP  ?vitamin B-12 (CYANOCOBALAMIN) 1000 MCG tablet Take 1 tablet (1,000 mcg total) by mouth daily. 07/17/21   [name], NP  ?Vitamin D, Ergocalciferol, (DRISDOL) 1.25 MG (50000 UNIT) CAPS capsule Take 50,000 Units by mouth every Monday.    [provider]  ?   ? ?Allergies    ?Food, Aspirin, and Northern quahog clam (m. mercenaria) skin test   ? ?Review of Systems   ?Review of Systems ? ?Physical Exam ?Updated Vital Signs ?BP 114/72 (BP Location: Right Arm)   Pulse 74   Temp 98.3 ?F (36.8 ?C) (Oral)   Resp 18  SpO2 98%  ?Physical Exam ?Vitals and nursing note reviewed.  ?Constitutional:   ?   General: He is not in acute distress. ?   Appearance: He is well-developed.  ?HENT:  ?   Head: Normocephalic and atraumatic.  ?   Mouth/Throat:  ?   Mouth: Mucous membranes are moist.  ?Eyes:  ?   General: Vision grossly intact. Gaze aligned appropriately.  ?   Extraocular Movements: Extraocular movements intact.  ?   Conjunctiva/sclera: Conjunctivae normal.  ?Cardiovascular:  ?   Rate and Rhythm: Normal rate and regular rhythm.  ?   Pulses: Normal pulses.  ?   Heart sounds: Normal heart sounds, S1 normal and S2 normal. No murmur heard. ?  No friction rub. No gallop.  ?Pulmonary:  ?    Effort: Pulmonary effort is normal. No respiratory distress.  ?   Breath sounds: Normal breath sounds.  ?Abdominal:  ?   Palpations: Abdomen is soft.  ?   Tenderness: There is no abdominal tenderness. There is no guarding or rebound.  ?   Hernia: No hernia is present.  ?Musculoskeletal:     ?   General: No swelling.  ?   Cervical back: Full passive range of motion without pain, normal range of motion and neck supple. No pain with movement, spinous process tenderness or muscular tenderness. Normal range of motion.  ?   Right lower leg: No edema.  ?   Left lower leg: No edema.  ?   Right Lower Extremity: Right leg is amputated above knee.  ?Skin: ?   General: Skin is warm and dry.  ?   Capillary Refill: Capillary refill takes less than 2 seconds.  ?   Findings: No ecchymosis, erythema, lesion or wound.  ?   Comments: Staples in place stump of right leg, no erythema, fluctuance, drainage  ?Neurological:  ?   Mental Status: He is alert.  ?   GCS: GCS eye subscore is 4. GCS verbal subscore is 4. GCS motor subscore is 6.  ?   Cranial Nerves: Cranial nerves 2-12 are intact.  ?   Sensory: Sensation is intact.  ?   Motor: Motor function is intact. No weakness or abnormal muscle tone.  ?   Coordination: Coordination is intact.  ?Psychiatric:     ?   Mood and Affect: Mood normal.     ?   Speech: Speech normal.     ?   Behavior: Behavior normal.  ? ? ?ED Results / Procedures / Treatments   ?Labs ?(all labs ordered are listed, but only abnormal results are displayed) ?Labs Reviewed  ?BASIC METABOLIC PANEL - Abnormal; Notable for the following components:  ?    Result Value  ? Potassium 3.1 (*)   ? Glucose, Bld 159 (*)   ? Anion gap 4 (*)   ? All other components within normal limits  ?CBC WITH DIFFERENTIAL/PLATELET - Abnormal; Notable for the following components:  ? Hemoglobin 11.4 (*)   ? HCT 37.8 (*)   ? RDW 19.9 (*)   ? All other components within normal limits  ? ? ?EKG ?None ? ?Radiology ?CT Angio Aortobifemoral W and/or  Wo Contrast ? ?Result Date: 08/29/2021 ?CLINICAL DATA:  History of leg pain, recent AKA for right lower extremity ischemia EXAM: CT ANGIOGRAPHY OF ABDOMINAL AORTA WITH ILIOFEMORAL RUNOFF TECHNIQUE: Multidetector CT imaging of the abdomen, pelvis and lower extremities was performed using the standard protocol during bolus administration of intravenous contrast. Multiplanar CT image reconstructions  and MIPs were obtained to evaluate the vascular anatomy. RADIATION DOSE REDUCTION: This exam was performed according to the departmental dose-optimization program which includes automated exposure control, adjustment of the mA and/or kV according to patient size and/or use of iterative reconstruction technique. CONTRAST:  119m OMNIPAQUE IOHEXOL 350 MG/ML SOLN COMPARISON:  None. FINDINGS: VASCULAR Aorta: Abdominal aorta demonstrates atherosclerotic calcifications without evidence of aneurysmal dilatation or dissection. Celiac: Mild atherosclerotic calcifications are noted although no focal stenosis is seen. SMA: Mild atherosclerotic calcifications are noted although no focal stenosis is noted. Renals: Atherosclerotic calcifications are seen although no focal stenoses are noted. IMA: Patent without evidence of aneurysm, dissection, vasculitis or significant stenosis. RIGHT Lower Extremity Inflow: Atherosclerotic calcifications of the common and external iliac artery are noted. Runoff: Common femoral artery demonstrates atherosclerotic calcification. There is occlusion of the right superficial femoral artery just beyond its origin. Calcified profundus femoral branches are noted extending into the thigh. Occluded right bypass graft is noted. Changes of recent above knee amputation are noted. Distal runoff is not present. LEFT Lower Extremity Inflow: Atherosclerotic calcifications of the common and external iliac artery on the left are noted. No focal stenosis is seen. Runoff: Common femoral artery and femoral bifurcation are  widely patent. Superficial femoral artery demonstrates multifocal areas of calcification with mild stenosis is identified. Popliteal artery also demonstrates diffuse atherosclerotic calcification. The po

## 2021-08-29 NOTE — Discharge Instructions (Signed)
There is no evidence of a UTI today.  He appears to be having bladder spasm and you were given a medication to help with spasm.  Hopefully that will make him more comfortable and he will not require pain medication as much.  Continue to cover his suprapubic catheter so that he does not accidentally pull it out.  He does have narrowed arteries in his left leg but he is still getting blood flow down to his toes.  However that might be why his leg hurts intermittently.  Letting him hang his leg down will sometimes help the pain. ?

## 2021-09-03 ENCOUNTER — Encounter: Payer: Self-pay | Admitting: Physician Assistant

## 2021-09-08 ENCOUNTER — Ambulatory Visit (INDEPENDENT_AMBULATORY_CARE_PROVIDER_SITE_OTHER): Payer: Medicare Other | Admitting: Vascular Surgery

## 2021-09-08 ENCOUNTER — Encounter: Payer: Self-pay | Admitting: Vascular Surgery

## 2021-09-08 VITALS — BP 104/63 | HR 57 | Temp 98.0°F | Resp 18

## 2021-09-08 DIAGNOSIS — I739 Peripheral vascular disease, unspecified: Secondary | ICD-10-CM

## 2021-09-08 NOTE — Progress Notes (Signed)
? ?Patient name: Todd Dunn MRN: [id number] DOB: 05-06-1941 Sex: male ? ?REASON FOR VISIT: Postop check s/p right above knee amputation ? ?HPI: ?Todd Dunn is a 81 y.o. 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  This was for an occluded right common femoral to PT bypass with PTFE for CLI with toe gangrene performed on 03/26/2021 that ultimately occluded.  Amputation is healing today.  He is complaining of some phantom pain in the right limb. ? ?Past Medical History:  ?Diagnosis Date  ? BPH (benign prostatic hyperplasia)   ? CAD (coronary artery disease)   ? Chronic indwelling Foley catheter   ? CVA (cerebral vascular accident) [facility])   ? GERD (gastroesophageal reflux disease)   ? HTN (hypertension)   ? Neurogenic bladder   ? SSS (sick sinus syndrome) ([place])   ? s/p MDT ICD  ? Vitamin D deficiency   ? ? ?Past Surgical History:  ?Procedure Laterality Date  ? ABDOMINAL AORTOGRAM W/LOWER EXTREMITY N/A 03/23/2021  ? Procedure: ABDOMINAL AORTOGRAM W/LOWER EXTREMITY;  Surgeon: Waynetta Sandy, MD;  Location: [place] [facility];  Service: Cardiovascular;  Laterality: N/A;  ? AMPUTATION Right 04/01/2021  ? Procedure: RIGHT GREAT TOE AMPUTATION;  Surgeon: Newt Minion, MD;  Location: [place];  Service: Orthopedics;  Laterality: Right;  ? AMPUTATION Right 07/30/2021  ? Procedure: AMPUTATION ABOVE KNEE;  Surgeon: Broadus John, MD;  Location: [place];  Service: Vascular;  Laterality: Right;  ? APPENDECTOMY    ? APPLICATION OF WOUND VAC Right 04/01/2021  ? Procedure: APPLICATION OF WOUND VAC;  Surgeon: Newt Minion, MD;  Location: [place];  Service: Orthopedics;  Laterality: Right;  ? CARDIAC SURGERY    ? FEMORAL-TIBIAL BYPASS GRAFT Right 03/26/2021  ? Procedure: BYPASS GRAFT FEMORAL-TIBIAL ARTERY.  Harvest right leg saphenous vein, right vein patch angioplasty  posterior tibial.;  Surgeon: Marty Heck, MD;  [facility];  Service: Vascular;  Laterality: Right;  ? IR US GUIDE BX ASP/DRAIN  08/03/2021  ? IR US GUIDE BX ASP/DRAIN  08/06/2021  ? PACEMAKER PLACEMENT    ? ? ?Family History  ?Family history unknown: Yes  ? ? ?SOCIAL HISTORY: ?Social History  ? ?Tobacco Use  ? Smoking status: Former  ?  Types: Cigarettes  ?  Passive exposure: Never  ? Smokeless tobacco: Never  ?Substance Use Topics  ? Alcohol use: Not Currently  ? ? ?Allergies  ?Allergen Reactions  ? Food Anaphylaxis and Rash  ?  Clams  ? Aspirin Itching  ? [place] Clam (M. Mercenaria) Skin Test Itching  ? ? ?Current Outpatient Medications  ?Medication Sig Dispense Refill  ? albuterol (VENTOLIN HFA) 108 (90 Base) MCG/ACT inhaler Inhale 2 puffs into the lungs every 6 (six) hours as needed for wheezing or shortness of breath. 6.7 g 0  ? amLODipine (NORVASC) 10 MG tablet Take 1 tablet (10 mg total) by mouth daily. 90 tablet 1  ? cephALEXin (KEFLEX) 500 MG capsule Take 1 capsule (500 mg total) by mouth 3 (three) times daily. (Patient taking differently: Take 500 mg by mouth See admin instructions. Tid x 10 days) 30 capsule 0  ? clopidogrel (PLAVIX) 75 MG tablet Take 1 tablet (75 mg total) by mouth at bedtime. 90 tablet 3  ? divalproex (DEPAKOTE) 125 MG DR tablet Take 1 tablet (125 mg total) by mouth at bedtime. 90 tablet 1  ?  finasteride (PROSCAR) 5 MG tablet Take 1 tablet (5 mg total) by mouth every morning. 30 tablet 2  ? HYDROcodone-acetaminophen (NORCO) 5-325 MG tablet Take 1 tablet by mouth every 6 (six) hours as needed for moderate pain. (Patient taking differently: Take 1 tablet by mouth every 8 (eight) hours.) 20 tablet 0  ? HYDROcodone-acetaminophen (NORCO/VICODIN) 5-325 MG tablet Take 1 tablet by mouth every 6 (six) hours as needed for moderate pain.    ? LORazepam (ATIVAN) 0.5 MG tablet Take 0.5 mg by mouth See admin instructions. Every 6 hours as needed for anxiety x 14 days    ?  nitroGLYCERIN (NITROSTAT) 0.4 MG SL tablet Place 0.4 mg under the tongue every 5 (five) minutes x 3 doses as needed for chest pain.    ? oxybutynin (DITROPAN) 5 MG tablet Take 1 tablet (5 mg total) by mouth every 8 (eight) hours as needed for bladder spasms. 120 tablet 1  ? pantoprazole (PROTONIX) 40 MG tablet TAKE 1 TABLET(40 MG) BY MOUTH DAILY (Patient taking differently: Take 40 mg by mouth daily.) 90 tablet 0  ? polyethylene glycol (MIRALAX / GLYCOLAX) 17 g packet Take 17 g by mouth daily as needed for mild constipation. 14 each 0  ? rosuvastatin (CRESTOR) 10 MG tablet Take 1 tablet (10 mg total) by mouth every morning. 90 tablet 3  ? vitamin B-12 (CYANOCOBALAMIN) 1000 MCG tablet Take 1 tablet (1,000 mcg total) by mouth daily. 30 tablet 3  ? Vitamin D, Ergocalciferol, (DRISDOL) 1.25 MG (50000 UNIT) CAPS capsule Take 50,000 Units by mouth every Monday.    ? ?No current facility-administered medications for this visit.  ? ? ?REVIEW OF SYSTEMS:  ?'[X]'$  denotes positive finding, '[ ]'$  denotes negative finding ?Cardiac  Comments:  ?Chest pain or chest pressure:    ?Shortness of breath upon exertion:    ?Short of breath when lying flat:    ?Irregular heart rhythm:    ?    ?Vascular    ?Pain in calf, thigh, or hip brought on by ambulation:    ?Pain in feet at night that wakes you up from your sleep:     ?Blood clot in your veins:    ?Leg swelling:     ?    ?Pulmonary    ?Oxygen at home:    ?Productive cough:     ?Wheezing:     ?    ?Neurologic    ?Sudden weakness in arms or legs:     ?Sudden numbness in arms or legs:     ?Sudden onset of difficulty speaking or slurred speech:    ?Temporary loss of vision in one eye:     ?Problems with dizziness:     ?    ?Gastrointestinal    ?Blood in stool:     ?Vomited blood:     ?    ?Genitourinary    ?Burning when urinating:     ?Blood in urine:    ?    ?Psychiatric    ?Major depression:     ?    ?Hematologic    ?Bleeding problems:    ?Problems with blood clotting too easily:    ?     ?Skin    ?Rashes or ulcers:    ?    ?Constitutional    ?Fever or chills:    ? ? ?PHYSICAL EXAM: ?Vitals:  ? 09/08/21 1001  ?BP: 104/63  ?Pulse: (!) 57  ?Resp: 18  ?Temp: 98 ?F (36.7 ?C)  ?TempSrc: Temporal  ?  SpO2: 98%  ? ? ?GENERAL: The patient is a well-nourished male, in no acute distress. The vital signs are documented above. ?CARDIAC: There is a regular rate and rhythm.  ?VASCULAR:  ?Right above-knee amputation healing and staples removed, no drainage, no signs of infection ? ?DATA:  ? ?None ? ?Assessment/Plan: ? ?81 year old male status post right common femoral to PT bypass on 03/26/2021 for CLI with toe gangrene that ultimately occluded and most recently required above-knee amputation on the right on 07/30/2021.  This is healing nicely.  Staples removed today.  Discussed with his granddaughter on the phone her concerns for the left leg.  I did look at the left foot today and he has no tissue loss.  He is mostly complaining of phantom pain in the right leg.  We will have him follow-up in 6 months with ABIs in the [facility] clinic for ongoing surveillance of the left leg. ? ? ?Marty Heck, MD ?[facility] ?Office: [phone] ? ? ? ? ? ?

## 2021-09-15 ENCOUNTER — Telehealth: Payer: Self-pay | Admitting: Nurse Practitioner

## 2021-09-15 NOTE — Telephone Encounter (Signed)
Call returned to [facility].  She explained that she placed an order with [facility]  for a wheelchair with [employer] and she wanted to make PCP aware, so PCP could sign off on required documentation.  I informed her that the PCP has not seen the patient since his amputation and does not have documentation that addresses the need for the wheelchair and Mikle Bosworth said she would re-submit the order to the surgeon.  ?

## 2021-09-15 NOTE — Telephone Encounter (Signed)
Copied from [place] [id number]. Topic: General - Other ?>> Sep 14, 2021  2:45 PM Yvette Rack wrote: ?Reason for CRM: Mikle Bosworth with [facility] reports a wheelchair was ordered for patient and services are on hold until the wheelchair arrives. Cb# [phone] ?

## 2021-09-16 ENCOUNTER — Other Ambulatory Visit: Payer: Self-pay | Admitting: *Deleted

## 2021-09-16 DIAGNOSIS — I70221 Atherosclerosis of native arteries of extremities with rest pain, right leg: Secondary | ICD-10-CM

## 2021-09-16 DIAGNOSIS — I739 Peripheral vascular disease, unspecified: Secondary | ICD-10-CM

## 2021-09-18 ENCOUNTER — Telehealth: Payer: Self-pay | Admitting: Nurse Practitioner

## 2021-09-18 NOTE — Telephone Encounter (Signed)
Copied from [place] [id number]. Topic: General - Other >> Sep 17, 2021 10:42 AM Yvette Rack wrote: Reason for CRM: Gregary Signs [facility] Nurse Case Manager reports that they will be following patient over the phone and she would like to leave her contact # [phone] Ext [phone]

## 2021-09-24 ENCOUNTER — Other Ambulatory Visit: Payer: Self-pay

## 2021-09-24 ENCOUNTER — Encounter (HOSPITAL_COMMUNITY): Payer: Self-pay | Admitting: Pharmacy Technician

## 2021-09-24 ENCOUNTER — Telehealth: Payer: Self-pay | Admitting: Nurse Practitioner

## 2021-09-24 ENCOUNTER — Emergency Department (HOSPITAL_COMMUNITY)
Admission: EM | Admit: 2021-09-24 | Discharge: 2021-09-24 | Disposition: A | Discharge status: Home / Self Care | Payer: Medicare Other | Source: Home / Self Care | Attending: Emergency Medicine | Admitting: Emergency Medicine

## 2021-09-24 DIAGNOSIS — E876 Hypokalemia: Secondary | ICD-10-CM | POA: Insufficient documentation

## 2021-09-24 DIAGNOSIS — R32 Unspecified urinary incontinence: Secondary | ICD-10-CM | POA: Diagnosis not present

## 2021-09-24 DIAGNOSIS — N3001 Acute cystitis with hematuria: Secondary | ICD-10-CM | POA: Insufficient documentation

## 2021-09-24 DIAGNOSIS — Z7902 Long term (current) use of antithrombotics/antiplatelets: Secondary | ICD-10-CM | POA: Insufficient documentation

## 2021-09-24 DIAGNOSIS — I2510 Atherosclerotic heart disease of native coronary artery without angina pectoris: Secondary | ICD-10-CM | POA: Insufficient documentation

## 2021-09-24 DIAGNOSIS — T83510A Infection and inflammatory reaction due to cystostomy catheter, initial encounter: Secondary | ICD-10-CM | POA: Diagnosis not present

## 2021-09-24 LAB — BASIC METABOLIC PANEL
Anion gap: 7 (ref 5–15)
BUN: 13 mg/dL (ref 8–23)
CO2: 26 mmol/L (ref 22–32)
Calcium: 9.9 mg/dL (ref 8.9–10.3)
Chloride: 111 mmol/L (ref 98–111)
Creatinine, Ser: 0.75 mg/dL (ref 0.61–1.24)
GFR, Estimated: >60 mL/min (ref >60)
Glucose, Bld: 93 mg/dL (ref 70–99)
Potassium: 3.2 mmol/L — ABNORMAL LOW (ref 3.5–5.1)
Sodium: 144 mmol/L (ref 135–145)

## 2021-09-24 LAB — URINALYSIS, ROUTINE W REFLEX MICROSCOPIC
Bilirubin Urine: NEGATIVE
Glucose, UA: NEGATIVE mg/dL
Ketones, ur: NEGATIVE mg/dL
Nitrite: POSITIVE — AB
Protein, ur: 30 mg/dL — AB
Specific Gravity, Urine: 1.009 (ref 1.005–1.030)
WBC, UA: >50 WBC/hpf — ABNORMAL HIGH (ref 0–5)
pH: 6 (ref 5.0–8.0)

## 2021-09-24 LAB — CBC
HCT: 39.2 % (ref 39.0–52.0)
Hemoglobin: 12 g/dL — ABNORMAL LOW (ref 13.0–17.0)
MCH: 27.4 pg (ref 26.0–34.0)
MCHC: 30.6 g/dL (ref 30.0–36.0)
MCV: 89.5 fL (ref 80.0–100.0)
Platelets: 194 10*3/uL (ref 150–400)
RBC: 4.38 MIL/uL (ref 4.22–5.81)
RDW: 17.2 % — ABNORMAL HIGH (ref 11.5–15.5)
WBC: 5.9 10*3/uL (ref 4.0–10.5)
nRBC: 0 % (ref 0.0–0.2)

## 2021-09-24 MED ORDER — ACETAMINOPHEN 500 MG PO TABS
1000.0000 mg | ORAL_TABLET | Freq: Once | ORAL | Status: AC
  Administered 2021-09-24: 1000 mg via ORAL
  Filled 2021-09-24: qty 2

## 2021-09-24 MED ORDER — CEPHALEXIN 500 MG PO CAPS
500.0000 mg | ORAL_CAPSULE | Freq: Four times a day (QID) | ORAL | 0 refills | Status: DC
Start: 2021-09-24 — End: 2021-10-03

## 2021-09-24 MED ORDER — POTASSIUM CHLORIDE CRYS ER 20 MEQ PO TBCR
40.0000 meq | EXTENDED_RELEASE_TABLET | Freq: Once | ORAL | Status: AC
  Administered 2021-09-24: 40 meq via ORAL
  Filled 2021-09-24: qty 2

## 2021-09-24 NOTE — ED Provider Notes (Signed)
?[place] ?Provider Note ? ? ?CSN: [id number] ?Arrival date & time: 09/24/21  1004 ? ?  ? ?History ? ?No chief complaint on file. ? ? ?Todd Dunn is a 81 y.o. male. ? ?The history is provided by a caregiver and medical records. No language interpreter was used.  ? 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  The majority of history was obtained through nurse aide.  For the past few days each time patient urinates he appears to have some lower abdominal discomfort.  States that he normally yells in pain.  Family was concerns for urinary tract infection and patient was placed on some kind of antibiotic.  Symptoms still persist thus prompting this ER visit.  No report of any fever no nausea vomiting.  Patient is mostly bedbound and does have a pressure ulcer that nursing is currently cared for.  No report of any lower back pain.  No report of trouble with bowel movement.  The remainder of the history is limited due to patient mental states however nurse have not noticed any significant change in his mental status from baseline. ? ?Home Medications ?Prior to Admission medications   ?Medication Sig Start Date End Date Taking? Authorizing Provider  ?albuterol (VENTOLIN HFA) 108 (90 Base) MCG/ACT inhaler Inhale 2 puffs into the lungs every 6 (six) hours as needed for wheezing or shortness of breath. 06/24/21   Terrilee Croak, MD  ?amLODipine (NORVASC) 10 MG tablet Take 1 tablet (10 mg total) by mouth daily. 07/17/21   [name], NP  ?cephALEXin (KEFLEX) 500 MG capsule Take 1 capsule (500 mg total) by mouth 3 (three) times daily. ?Patient taking differently: Take 500 mg by mouth See admin instructions. Tid x 10 days 6/54/65    Delora Fuel, MD  ?clopidogrel (PLAVIX) 75 MG tablet Take 1 tablet (75 mg total) by mouth at bedtime. 07/17/21   [name], NP  ?divalproex (DEPAKOTE) 125 MG DR tablet Take 1 tablet (125 mg total) by mouth at bedtime. 07/17/21 10/15/21  [name], NP  ?HYDROcodone-acetaminophen (NORCO) 5-325 MG tablet Take 1 tablet by mouth every 6 (six) hours as needed for moderate pain. ?Patient taking differently: Take 1 tablet by mouth every 8 (eight) hours. 08/04/21   Dessa Phi, DO  ?HYDROcodone-acetaminophen (NORCO/VICODIN) 5-325 MG tablet Take 1 tablet by mouth every 6 (six) hours as needed for moderate pain.    [provider]  ?LORazepam (ATIVAN) 0.5 MG tablet Take 0.5 mg by mouth See admin instructions. Every 6 hours as needed for anxiety x 14 days    [provider]  ?nitroGLYCERIN (NITROSTAT) 0.4 MG SL tablet Place 0.4 mg under the tongue every 5 (five) minutes x 3 doses as needed for chest pain.    [provider]  ?oxybutynin (DITROPAN) 5 MG tablet Take 1 tablet (5 mg total) by mouth every 8 (eight) hours as needed for bladder spasms. 08/29/21   Blanchie Dessert, MD  ?pantoprazole (PROTONIX) 40 MG tablet TAKE 1 TABLET(40 MG) BY MOUTH DAILY ?Patient taking differently: Take 40 mg by mouth daily. 07/21/21   Charlott Rakes, MD  ?polyethylene glycol (MIRALAX / GLYCOLAX) 17 g packet Take 17 g by mouth daily as needed for mild constipation. 07/17/21   [name], NP  ?  rosuvastatin (CRESTOR) 10 MG tablet Take 1 tablet (10 mg total) by mouth every morning. 07/17/21   [name], NP  ?vitamin B-12 (CYANOCOBALAMIN) 1000 MCG tablet Take 1 tablet (1,000 mcg total) by mouth daily. 07/17/21   [name], NP  ?Vitamin D, Ergocalciferol, (DRISDOL) 1.25 MG (50000 UNIT) CAPS capsule Take 50,000 Units by mouth every Monday.    [provider]  ?   ? ?Allergies    ?Food, Aspirin, and [place] clam (m. mercenaria) skin test   ? ?Review of Systems   ?Review of Systems   ?All other systems reviewed and are negative. ? ?Physical Exam ?Updated Vital Signs ?BP (!) 115/44 (BP Location: Left Arm)   Pulse (!) 54   Temp 98.8 ?F (37.1 ?C) (Oral)   Resp 18   SpO2 100%  ?Physical Exam ?Vitals and nursing note reviewed.  ?Constitutional:   ?   General: He is not in acute distress. ?   Appearance: He is well-developed.  ?   Comments: Elderly male laying in bed eating food and appears to be in no acute discomfort.  He is making good eye contact and smiling.  ?HENT:  ?   Head: Atraumatic.  ?Eyes:  ?   Conjunctiva/sclera: Conjunctivae normal.  ?Cardiovascular:  ?   Rate and Rhythm: Normal rate and regular rhythm.  ?   Pulses: Normal pulses.  ?   Heart sounds: Normal heart sounds.  ?Pulmonary:  ?   Effort: Pulmonary effort is normal.  ?   Breath sounds: Normal breath sounds.  ?Abdominal:  ?   Palpations: Abdomen is soft.  ?   Tenderness: There is abdominal tenderness (Suprapubic catheter, tenderness to palpation when palpated his abdomen but no overlying skin changes).  ?   Comments: Foley bag putting out moderate amount of orange urine  ?Genitourinary: ?   Comments: Chaperone present during exam.  There is a dime size skin breakdown to the sacral region without surrounding erythema or warmth. ?Musculoskeletal:  ?   Cervical back: Neck supple.  ?Skin: ?   Findings: No rash.  ?Neurological:  ?   Mental Status: He is alert.  ? ? ?ED Results / Procedures / Treatments   ?Labs ?(all labs ordered are listed, but only abnormal results are displayed) ?Labs Reviewed  ?URINALYSIS, ROUTINE W REFLEX MICROSCOPIC - Abnormal; Notable for the following components:  ?    Result Value  ? Color, Urine AMBER (*)   ? APPearance HAZY (*)   ? Hgb urine dipstick MODERATE (*)   ? Protein, ur 30 (*)   ? Nitrite POSITIVE (*)   ? Leukocytes,Ua MODERATE (*)   ? WBC, UA >50 (*)   ? Bacteria, UA MANY (*)   ? All other components within normal limits  ?BASIC METABOLIC PANEL - Abnormal; Notable for the following components:   ? Potassium 3.2 (*)   ? All other components within normal limits  ?CBC - Abnormal; Notable for the following components:  ? Hemoglobin 12.0 (*)   ? RDW 17.2 (*)   ? All other components within normal limits  ?URINE CULTURE  ?TYPE AND SCREEN  ? ? ?EKG ?None ? ?Radiology ?No results found. ? ?Procedures ?Procedures  ? ? ?Medications Ordered in ED ?Medications  ?potassium chloride SA (KLOR-CON M) CR tablet 40 mEq (has no administration in time range)  ?acetaminophen (TYLENOL) tablet 1,000 mg (has no administration in time range)  ? ? ?ED Course/ Medical Decision Making/ A&P ?  ?                        ?  Medical Decision Making ?Amount and/or Complexity of Data Reviewed ?Labs: ordered. ? ?Risk ?OTC drugs. ?Prescription drug management. ? ? ?BP (!) 115/44 (BP Location: Left Arm)   Pulse (!) 54   Temp 98.8 ?F (37.1 ?C) (Oral)   Resp 18   SpO2 100%  ? ?10:35 AM ?This is an 81 year old male with significant history of chronic suprapubic Foley catheter, history of CVA, CAD, who has had what appears to be painful urination for the past several days according to his nurse aide.  It was reported the patient has been on Azo without relief.  Unsure when his last Foley catheter was changed.  At this time patient is overall well-appearing appears to be in no acute discomfort.  He does have some tenderness when I palpate his abdomen at the suprapubic Foley catheter site but I do not appreciate any overlying skin changes.  Vital signs obtained and patient is afebrile, mildly bradycardic with a heart rate of 54, blood pressure is 115/44.  Plan to obtain urinalysis along with urine culture, check basic labs, and anticipate treating for suspected UTI with Keflex.  Care discussed with Dr. Sabra Heck. ? ?12:37 PM ?Urinalysis has resulted and is consistent with UTI.  Urine culture has been sent.  Patient will be treated with Keflex.  Otherwise, patient has normal WBC, overall well-appearing, and can be discharged home with p.o.  antibiotic.  Return precaution given. ? ?This patient presents to the ED for concern of dysuria, this involves an extensive number of treatment options, and is a complaint that carries with it a high risk of complicat

## 2021-09-24 NOTE — ED Notes (Signed)
When going to d/c pt pts caregiver was not with pt ?

## 2021-09-24 NOTE — Telephone Encounter (Signed)
Home Health Verbal Orders - Caller/Agency: [facility]  ?Callback Number: [phone] ?Requesting Skilled Nursing  Evaluation in the home for skin breakdown ?

## 2021-09-24 NOTE — ED Triage Notes (Signed)
Pt bib nursing aide with concern for possible UTI. Pt with suprapubic catheter and aide reports pt yells In pain when he urinates. Pt currently taking Azo.  ?

## 2021-09-24 NOTE — ED Notes (Signed)
Pt stated his visitor would be back soon ?

## 2021-09-24 NOTE — ED Notes (Signed)
Another RN saw pt leaving with caregiver ?

## 2021-09-24 NOTE — Telephone Encounter (Signed)
Mickel Baas calling from [facility] is calling to verbal orders for resume Nursing care 1 w 3 Please advise . ?Pt also reports UTI and a small place on his bottom.  ?Mickel Baas was going to put a patch on it. Going to need an order for that ? ?Cb- [phone] verbal ok on voicemail ?

## 2021-09-24 NOTE — Telephone Encounter (Signed)
Jerilynn with [facility] would like you to give her a call so she cn update you on pt's wheelchair. ?[phone] ?

## 2021-09-24 NOTE — Telephone Encounter (Signed)
Forwarding to patient's PCP.

## 2021-09-24 NOTE — ED Notes (Signed)
Pt not here, attempting to find out where pt is at this time ?

## 2021-09-24 NOTE — Telephone Encounter (Signed)
Call returned to [place], PT, message left with call back requested to this CM ?

## 2021-09-24 NOTE — Discharge Instructions (Addendum)
You have been diagnosed with a urinary tract infection.  Take antibiotic prescribed as treatment.  Follow up closely with your doctor for recheck.  ?

## 2021-09-25 NOTE — Telephone Encounter (Signed)
Verbal order given  

## 2021-09-25 NOTE — Telephone Encounter (Signed)
Mickel Baas calling from [facility] is calling to follow up on verbal orders. Please advise CB- [phone] ?

## 2021-09-26 ENCOUNTER — Emergency Department (HOSPITAL_COMMUNITY): Payer: Medicare Other

## 2021-09-26 ENCOUNTER — Encounter (HOSPITAL_COMMUNITY): Payer: Self-pay | Admitting: Emergency Medicine

## 2021-09-26 ENCOUNTER — Inpatient Hospital Stay (HOSPITAL_COMMUNITY)
Admission: EM | Admit: 2021-09-26 | Discharge: 2021-10-03 | DRG: 698 | Disposition: A | Discharge status: Skilled Nursing Facility | Payer: Medicare Other | Attending: Internal Medicine | Admitting: Internal Medicine

## 2021-09-26 ENCOUNTER — Other Ambulatory Visit: Payer: Self-pay

## 2021-09-26 DIAGNOSIS — Z8619 Personal history of other infectious and parasitic diseases: Secondary | ICD-10-CM | POA: Diagnosis present

## 2021-09-26 DIAGNOSIS — Z7902 Long term (current) use of antithrombotics/antiplatelets: Secondary | ICD-10-CM

## 2021-09-26 DIAGNOSIS — E876 Hypokalemia: Secondary | ICD-10-CM | POA: Diagnosis present

## 2021-09-26 DIAGNOSIS — Z781 Physical restraint status: Secondary | ICD-10-CM

## 2021-09-26 DIAGNOSIS — B9629 Other Escherichia coli [E. coli] as the cause of diseases classified elsewhere: Secondary | ICD-10-CM | POA: Diagnosis present

## 2021-09-26 DIAGNOSIS — I110 Hypertensive heart disease with heart failure: Secondary | ICD-10-CM | POA: Diagnosis present

## 2021-09-26 DIAGNOSIS — E559 Vitamin D deficiency, unspecified: Secondary | ICD-10-CM | POA: Diagnosis present

## 2021-09-26 DIAGNOSIS — T83510A Infection and inflammatory reaction due to cystostomy catheter, initial encounter: Principal | ICD-10-CM | POA: Diagnosis present

## 2021-09-26 DIAGNOSIS — E860 Dehydration: Secondary | ICD-10-CM | POA: Diagnosis present

## 2021-09-26 DIAGNOSIS — I69320 Aphasia following cerebral infarction: Secondary | ICD-10-CM

## 2021-09-26 DIAGNOSIS — I495 Sick sinus syndrome: Secondary | ICD-10-CM | POA: Diagnosis present

## 2021-09-26 DIAGNOSIS — Z1612 Extended spectrum beta lactamase (ESBL) resistance: Secondary | ICD-10-CM | POA: Diagnosis present

## 2021-09-26 DIAGNOSIS — G9341 Metabolic encephalopathy: Secondary | ICD-10-CM | POA: Diagnosis present

## 2021-09-26 DIAGNOSIS — L8990 Pressure ulcer of unspecified site, unspecified stage: Secondary | ICD-10-CM | POA: Insufficient documentation

## 2021-09-26 DIAGNOSIS — Z79899 Other long term (current) drug therapy: Secondary | ICD-10-CM

## 2021-09-26 DIAGNOSIS — N319 Neuromuscular dysfunction of bladder, unspecified: Secondary | ICD-10-CM | POA: Diagnosis present

## 2021-09-26 DIAGNOSIS — Y846 Urinary catheterization as the cause of abnormal reaction of the patient, or of later complication, without mention of misadventure at the time of the procedure: Secondary | ICD-10-CM | POA: Diagnosis present

## 2021-09-26 DIAGNOSIS — E46 Unspecified protein-calorie malnutrition: Secondary | ICD-10-CM | POA: Diagnosis present

## 2021-09-26 DIAGNOSIS — Z89611 Acquired absence of right leg above knee: Secondary | ICD-10-CM

## 2021-09-26 DIAGNOSIS — Z87891 Personal history of nicotine dependence: Secondary | ICD-10-CM

## 2021-09-26 DIAGNOSIS — Z9359 Other cystostomy status: Principal | ICD-10-CM

## 2021-09-26 DIAGNOSIS — Z95810 Presence of automatic (implantable) cardiac defibrillator: Secondary | ICD-10-CM

## 2021-09-26 DIAGNOSIS — N400 Enlarged prostate without lower urinary tract symptoms: Secondary | ICD-10-CM | POA: Diagnosis present

## 2021-09-26 DIAGNOSIS — A499 Bacterial infection, unspecified: Secondary | ICD-10-CM

## 2021-09-26 DIAGNOSIS — I69322 Dysarthria following cerebral infarction: Secondary | ICD-10-CM

## 2021-09-26 DIAGNOSIS — I2510 Atherosclerotic heart disease of native coronary artery without angina pectoris: Secondary | ICD-10-CM | POA: Diagnosis present

## 2021-09-26 DIAGNOSIS — L89153 Pressure ulcer of sacral region, stage 3: Secondary | ICD-10-CM | POA: Diagnosis present

## 2021-09-26 DIAGNOSIS — B9620 Unspecified Escherichia coli [E. coli] as the cause of diseases classified elsewhere: Secondary | ICD-10-CM | POA: Diagnosis present

## 2021-09-26 DIAGNOSIS — Z681 Body mass index (BMI) 19 or less, adult: Secondary | ICD-10-CM

## 2021-09-26 DIAGNOSIS — K219 Gastro-esophageal reflux disease without esophagitis: Secondary | ICD-10-CM | POA: Diagnosis present

## 2021-09-26 DIAGNOSIS — R001 Bradycardia, unspecified: Secondary | ICD-10-CM | POA: Diagnosis not present

## 2021-09-26 DIAGNOSIS — I739 Peripheral vascular disease, unspecified: Secondary | ICD-10-CM | POA: Diagnosis present

## 2021-09-26 DIAGNOSIS — N390 Urinary tract infection, site not specified: Secondary | ICD-10-CM | POA: Diagnosis present

## 2021-09-26 DIAGNOSIS — I5042 Chronic combined systolic (congestive) and diastolic (congestive) heart failure: Secondary | ICD-10-CM | POA: Diagnosis present

## 2021-09-26 DIAGNOSIS — N3001 Acute cystitis with hematuria: Secondary | ICD-10-CM | POA: Diagnosis present

## 2021-09-26 LAB — CBC WITH DIFFERENTIAL/PLATELET
Abs Immature Granulocytes: 0.02 10*3/uL (ref 0.00–0.07)
Basophils Absolute: 0 10*3/uL (ref 0.0–0.1)
Basophils Relative: 0 %
Eosinophils Absolute: 0 10*3/uL (ref 0.0–0.5)
Eosinophils Relative: 1 %
HCT: 44.8 % (ref 39.0–52.0)
Hemoglobin: 14 g/dL (ref 13.0–17.0)
Immature Granulocytes: 0 %
Lymphocytes Relative: 17 %
Lymphs Abs: 1 10*3/uL (ref 0.7–4.0)
MCH: 28.1 pg (ref 26.0–34.0)
MCHC: 31.3 g/dL (ref 30.0–36.0)
MCV: 89.8 fL (ref 80.0–100.0)
Monocytes Absolute: 0.5 10*3/uL (ref 0.1–1.0)
Monocytes Relative: 9 %
Neutro Abs: 4.3 10*3/uL (ref 1.7–7.7)
Neutrophils Relative %: 73 %
Platelets: 183 10*3/uL (ref 150–400)
RBC: 4.99 MIL/uL (ref 4.22–5.81)
RDW: 17 % — ABNORMAL HIGH (ref 11.5–15.5)
WBC: 5.9 10*3/uL (ref 4.0–10.5)
nRBC: 0 % (ref 0.0–0.2)

## 2021-09-26 LAB — COMPREHENSIVE METABOLIC PANEL
ALT: 10 U/L (ref 0–44)
AST: 17 U/L (ref 15–41)
Albumin: 3.3 g/dL — ABNORMAL LOW (ref 3.5–5.0)
Alkaline Phosphatase: 133 U/L — ABNORMAL HIGH (ref 38–126)
Anion gap: 10 (ref 5–15)
BUN: 13 mg/dL (ref 8–23)
CO2: 26 mmol/L (ref 22–32)
Calcium: 10.2 mg/dL (ref 8.9–10.3)
Chloride: 106 mmol/L (ref 98–111)
Creatinine, Ser: 0.83 mg/dL (ref 0.61–1.24)
GFR, Estimated: >60 mL/min (ref >60)
Glucose, Bld: 114 mg/dL — ABNORMAL HIGH (ref 70–99)
Potassium: 3.3 mmol/L — ABNORMAL LOW (ref 3.5–5.1)
Sodium: 142 mmol/L (ref 135–145)
Total Bilirubin: 1 mg/dL (ref 0.3–1.2)
Total Protein: 6.8 g/dL (ref 6.5–8.1)

## 2021-09-26 LAB — URINE CULTURE: Culture: >=100000 — AB

## 2021-09-26 LAB — TROPONIN I (HIGH SENSITIVITY)
Troponin I (High Sensitivity): 31 ng/L — ABNORMAL HIGH (ref <18)
Troponin I (High Sensitivity): 26 ng/L — ABNORMAL HIGH (ref <18)

## 2021-09-26 LAB — LIPASE, BLOOD: Lipase: 26 U/L (ref 11–51)

## 2021-09-26 MED ORDER — OXYBUTYNIN CHLORIDE 5 MG PO TABS: 5.0000 mg | ORAL_TABLET | Freq: Three times a day (TID) | ORAL | Status: DC | PRN

## 2021-09-26 MED ORDER — MAGNESIUM SULFATE 2 GM/50ML IV SOLN
2.0000 g | Freq: Once | INTRAVENOUS | Status: AC
  Administered 2021-09-26: 2 g via INTRAVENOUS
  Filled 2021-09-26: qty 50

## 2021-09-26 MED ORDER — ALBUTEROL SULFATE (2.5 MG/3ML) 0.083% IN NEBU: 2.5000 mg | INHALATION_SOLUTION | Freq: Four times a day (QID) | RESPIRATORY_TRACT | Status: DC | PRN

## 2021-09-26 MED ORDER — SODIUM CHLORIDE 0.9 % IV SOLN
1.0000 g | Freq: Three times a day (TID) | INTRAVENOUS | Status: AC
  Administered 2021-09-26 – 2021-10-03 (×21): 1 g via INTRAVENOUS
  Filled 2021-09-26 (×21): qty 20

## 2021-09-26 MED ORDER — PANTOPRAZOLE SODIUM 40 MG PO TBEC
40.0000 mg | DELAYED_RELEASE_TABLET | Freq: Every day | ORAL | Status: DC
  Administered 2021-09-26 – 2021-10-03 (×8): 40 mg via ORAL
  Filled 2021-09-26 (×8): qty 1

## 2021-09-26 MED ORDER — POTASSIUM CHLORIDE IN NACL 20-0.9 MEQ/L-% IV SOLN
INTRAVENOUS | Status: AC
  Filled 2021-09-26 (×4): qty 1000

## 2021-09-26 MED ORDER — CLOPIDOGREL BISULFATE 75 MG PO TABS
75.0000 mg | ORAL_TABLET | Freq: Every day | ORAL | Status: DC
  Administered 2021-09-27 – 2021-10-02 (×6): 75 mg via ORAL
  Filled 2021-09-26 (×6): qty 1

## 2021-09-26 MED ORDER — RISAQUAD PO CAPS
2.0000 | ORAL_CAPSULE | Freq: Every day | ORAL | Status: DC
  Administered 2021-09-27 – 2021-10-03 (×7): 2 via ORAL
  Filled 2021-09-26 (×8): qty 2

## 2021-09-26 MED ORDER — SODIUM CHLORIDE 0.9 % IV SOLN: INTRAVENOUS | Status: DC

## 2021-09-26 MED ORDER — ROSUVASTATIN CALCIUM 5 MG PO TABS
10.0000 mg | ORAL_TABLET | Freq: Every morning | ORAL | Status: DC
  Administered 2021-09-26 – 2021-09-28 (×3): 10 mg via ORAL
  Filled 2021-09-26 (×3): qty 2

## 2021-09-26 MED ORDER — ALBUTEROL SULFATE HFA 108 (90 BASE) MCG/ACT IN AERS: 2.0000 | INHALATION_SPRAY | Freq: Four times a day (QID) | RESPIRATORY_TRACT | Status: DC | PRN

## 2021-09-26 MED ORDER — AMLODIPINE BESYLATE 10 MG PO TABS
10.0000 mg | ORAL_TABLET | Freq: Every day | ORAL | Status: DC
  Administered 2021-09-26 – 2021-10-03 (×8): 10 mg via ORAL
  Filled 2021-09-26 (×6): qty 1
  Filled 2021-09-26: qty 2
  Filled 2021-09-26: qty 1

## 2021-09-26 MED ORDER — DIVALPROEX SODIUM 125 MG PO DR TAB
125.0000 mg | DELAYED_RELEASE_TABLET | Freq: Every day | ORAL | Status: DC
  Administered 2021-09-27 – 2021-10-02 (×6): 125 mg via ORAL
  Filled 2021-09-26 (×8): qty 1

## 2021-09-26 MED ORDER — BACID PO TABS
2.0000 | ORAL_TABLET | Freq: Three times a day (TID) | ORAL | Status: DC
  Filled 2021-09-26 (×2): qty 2

## 2021-09-26 MED ORDER — POLYETHYLENE GLYCOL 3350 17 G PO PACK
17.0000 g | PACK | Freq: Every day | ORAL | Status: DC | PRN
  Administered 2021-10-03: 17 g via ORAL
  Filled 2021-09-26: qty 1

## 2021-09-26 MED ORDER — POTASSIUM CHLORIDE CRYS ER 20 MEQ PO TBCR
40.0000 meq | EXTENDED_RELEASE_TABLET | Freq: Once | ORAL | Status: AC
  Administered 2021-09-26: 40 meq via ORAL
  Filled 2021-09-26: qty 2

## 2021-09-26 MED ORDER — ENOXAPARIN SODIUM 40 MG/0.4ML IJ SOSY
40.0000 mg | PREFILLED_SYRINGE | INTRAMUSCULAR | Status: DC
  Administered 2021-09-27 – 2021-10-03 (×6): 40 mg via SUBCUTANEOUS
  Filled 2021-09-26 (×7): qty 0.4

## 2021-09-26 MED ORDER — HYDROCODONE-ACETAMINOPHEN 5-325 MG PO TABS
1.0000 | ORAL_TABLET | Freq: Four times a day (QID) | ORAL | Status: DC | PRN
  Administered 2021-09-27 – 2021-10-03 (×5): 1 via ORAL
  Filled 2021-09-26 (×6): qty 1

## 2021-09-26 MED ORDER — ATROPINE SULFATE 1 MG/10ML IJ SOSY
PREFILLED_SYRINGE | INTRAMUSCULAR | Status: AC
  Filled 2021-09-26: qty 10

## 2021-09-26 MED ORDER — VITAMIN B-12 1000 MCG PO TABS
1000.0000 ug | ORAL_TABLET | Freq: Every day | ORAL | Status: DC
  Administered 2021-09-27 – 2021-10-03 (×7): 1000 ug via ORAL
  Filled 2021-09-26 (×7): qty 1

## 2021-09-26 MED ORDER — LORAZEPAM 1 MG PO TABS
0.5000 mg | ORAL_TABLET | ORAL | Status: DC
Start: 2021-09-26 — End: 2021-09-26

## 2021-09-26 MED ORDER — HYDROMORPHONE HCL 1 MG/ML IJ SOLN
0.5000 mg | INTRAMUSCULAR | Status: AC | PRN
  Administered 2021-09-26 – 2021-09-27 (×3): 0.5 mg via INTRAVENOUS
  Filled 2021-09-26 (×4): qty 1

## 2021-09-26 NOTE — H&P (Signed)
?History and Physical  ? ? ?[name]:[id number] DOB: 07/29/1940 DOA: 09/26/2021 ? ?PCP: [name], NP (Confirm with patient/family/NH records and if not entered, this has to be entered at [facility] point of entry) ?Patient coming from: Home ? ?I have personally briefly reviewed patient's old medical records in [place] ? ?Chief Complaint: Todd Dunn hurts ? ?HPI: Todd Dunn is a 81 y.o. male with medical history significant of indwelling suprapubic urinary catheter secondary to severe BPH, CAD, PVD status post right AKA, SSS status post ICD, HTN, CVA with chronic aphasia, presented with worsening of suprapubic abdominal pain and mentation changes. ? ?Patient has chronic aphasia which without restriction from the stroke, or history provided patient's granddaughter over the phone.  Granddaughter reported that the patient has been complaining about suprapubic abdominal pain about 1 week ago, frequent nauseous but no vomiting and decreased oral intake.  She brought him to PCP 3 days ago, and work-up showed patient had a UTI and patient was started on Keflex.  Despite taking antibiotics for last 2 days, patient continued to experience worsening of suprapubic pain and worsening of confusion and agitation.  Last night, patient pulled out suprapubic catheter.  No fever as per family. ? ?ED Course: No tachycardia no hypotension, afebrile.  WBC 5.9, creatinine 0.8, K3.3.  Suprapubic catheter was changed in ED.  Urine culture from 2 days ago showed ESBL UTI, sensitive to imipenem. ? ?Review of Systems: Unable to perform, chronic aphasia. ? ?Past Medical History:  ?Diagnosis Date  ? BPH (benign prostatic hyperplasia)   ? CAD (coronary artery disease)   ? Chronic indwelling Foley catheter   ? CVA (cerebral vascular accident) [facility])   ? GERD (gastroesophageal reflux disease)   ? HTN (hypertension)   ? Neurogenic bladder   ? SSS (sick sinus syndrome) ([place])   ? s/p MDT ICD  ? Vitamin D deficiency   ? ? ?Past  Surgical History:  ?Procedure Laterality Date  ? ABDOMINAL AORTOGRAM W/LOWER EXTREMITY N/A 03/23/2021  ? Procedure: ABDOMINAL AORTOGRAM W/LOWER EXTREMITY;  Surgeon: Waynetta Sandy, MD;  Location: [place] [facility];  Service: Cardiovascular;  Laterality: N/A;  ? AMPUTATION Right 04/01/2021  ? Procedure: RIGHT GREAT TOE AMPUTATION;  Surgeon: Newt Minion, MD;  Location: [place];  Service: Orthopedics;  Laterality: Right;  ? AMPUTATION Right 07/30/2021  ? Procedure: AMPUTATION ABOVE KNEE;  Surgeon: Broadus John, MD;  Location: [place];  Service: Vascular;  Laterality: Right;  ? APPENDECTOMY    ? APPLICATION OF WOUND VAC Right 04/01/2021  ? Procedure: APPLICATION OF WOUND VAC;  Surgeon: Newt Minion, MD;  Location: [place];  Service: Orthopedics;  Laterality: Right;  ? CARDIAC SURGERY    ? FEMORAL-TIBIAL BYPASS GRAFT Right 03/26/2021  ? Procedure: BYPASS GRAFT FEMORAL-TIBIAL ARTERY.  Harvest right leg saphenous vein, right vein patch angioplasty posterior tibial.;  Surgeon: Marty Heck, MD;  [facility];  Service: Vascular;  Laterality: Right;  ? IR US GUIDE BX ASP/DRAIN  08/03/2021  ? IR US GUIDE BX ASP/DRAIN  08/06/2021  ? PACEMAKER PLACEMENT    ? ? ? reports that he has quit smoking. His smoking use included cigarettes. He has never been exposed to tobacco smoke. He has never used smokeless tobacco. He reports that he does not currently use alcohol. He reports that he does not use drugs. ? ?Allergies  ?Allergen Reactions  ? Food Anaphylaxis and Rash  ?  Clams  ? Aspirin Itching  ?  [place] Clam (M. Mercenaria) Skin Test Itching  ? ? ?Family History  ?Family history unknown: Yes  ? ? ? ?Prior to Admission medications   ?Medication Sig Start Date End Date Taking? Authorizing Provider  ?albuterol (VENTOLIN HFA) 108 (90 Base) MCG/ACT inhaler Inhale 2 puffs into the lungs every 6 (six) hours as needed for wheezing or shortness of breath. 06/24/21   Terrilee Croak, MD  ?amLODipine (NORVASC) 10  MG tablet Take 1 tablet (10 mg total) by mouth daily. 07/17/21   [name], NP  ?cephALEXin (KEFLEX) 500 MG capsule Take 1 capsule (500 mg total) by mouth 4 (four) times daily. 09/24/21   Domenic Moras, PA-C  ?clopidogrel (PLAVIX) 75 MG tablet Take 1 tablet (75 mg total) by mouth at bedtime. 07/17/21   [name], NP  ?divalproex (DEPAKOTE) 125 MG DR tablet Take 1 tablet (125 mg total) by mouth at bedtime. 07/17/21 10/15/21  [name], NP  ?HYDROcodone-acetaminophen (NORCO) 5-325 MG tablet Take 1 tablet by mouth every 6 (six) hours as needed for moderate pain. ?Patient taking differently: Take 1 tablet by mouth every 8 (eight) hours. 08/04/21   Dessa Phi, DO  ?HYDROcodone-acetaminophen (NORCO/VICODIN) 5-325 MG tablet Take 1 tablet by mouth every 6 (six) hours as needed for moderate pain.    [provider]  ?LORazepam (ATIVAN) 0.5 MG tablet Take 0.5 mg by mouth See admin instructions. Every 6 hours as needed for anxiety x 14 days    [provider]  ?nitroGLYCERIN (NITROSTAT) 0.4 MG SL tablet Place 0.4 mg under the tongue every 5 (five) minutes x 3 doses as needed for chest pain.    [provider]  ?oxybutynin (DITROPAN) 5 MG tablet Take 1 tablet (5 mg total) by mouth every 8 (eight) hours as needed for bladder spasms. 08/29/21   Blanchie Dessert, MD  ?pantoprazole (PROTONIX) 40 MG tablet TAKE 1 TABLET(40 MG) BY MOUTH DAILY ?Patient taking differently: Take 40 mg by mouth daily. 07/21/21   Charlott Rakes, MD  ?polyethylene glycol (MIRALAX / GLYCOLAX) 17 g packet Take 17 g by mouth daily as needed for mild constipation. 07/17/21   [name], NP  ?rosuvastatin (CRESTOR) 10 MG tablet Take 1 tablet (10 mg total) by mouth every morning. 07/17/21   [name], NP  ?vitamin B-12 (CYANOCOBALAMIN) 1000 MCG tablet Take 1 tablet (1,000 mcg total) by mouth daily. 07/17/21   [name], NP  ?Vitamin D, Ergocalciferol, (DRISDOL) 1.25 MG (50000 UNIT) CAPS capsule Take  50,000 Units by mouth every Monday.    [provider]  ? ? ?Physical Exam: ?Vitals:  ? 09/26/21 0907 09/26/21 0908 09/26/21 0952  ?BP: (!) 151/115  (!) 102/54  ?Pulse: 98  89  ?Resp: 16  16  ?Temp: 98 ?F (36.7 ?C)  98.1 ?F (36.7 ?C)  ?TempSrc:   Oral  ?SpO2: 99% 99% (!) 83%  ? ? ?Constitutional: NAD, calm, comfortable ?Vitals:  ? 09/26/21 0907 09/26/21 0908 09/26/21 0952  ?BP: (!) 151/115  (!) 102/54  ?Pulse: 98  89  ?Resp: 16  16  ?Temp: 98 ?F (36.7 ?C)  98.1 ?F (36.7 ?C)  ?TempSrc:   Oral  ?SpO2: 99% 99% (!) 83%  ? ?Eyes: PERRL, lids and conjunctivae normal ?ENMT: Mucous membranes are dry. Posterior pharynx clear of any exudate or lesions.Normal dentition.  ?Neck: normal, supple, no masses, no thyromegaly ?Respiratory: clear to auscultation bilaterally, no wheezing, no crackles. Normal respiratory effort. No accessory muscle use.  ?Cardiovascular:  Regular rate and rhythm, no murmurs / rubs / gallops. No extremity edema. 2+ pedal pulses. No carotid bruits.  ?Abdomen: tenderness on suprapubic area,, no masses palpated. No hepatosplenomegaly. Bowel sounds positive.  ?Musculoskeletal: no clubbing / cyanosis. No joint deformity upper and lower extremities. Good ROM, no contractures. Normal muscle tone.  ?Skin: no rashes, lesions, ulcers. No induration ?Neurologic: CN 2-12 grossly intact. Sensation intact, DTR normal. Strength 5/5 in all 4.  ?Psychiatric: Mentation at baseline ? ? ?Labs on Admission: I have personally reviewed following labs and imaging studies ? ?CBC: ?Recent Labs  ?Lab 09/24/21 ?1015 09/26/21 ?1100  ?WBC 5.9 5.9  ?NEUTROABS  --  4.3  ?HGB 12.0* 14.0  ?HCT 39.2 44.8  ?MCV 89.5 89.8  ?PLT 194 183  ? ?Basic Metabolic Panel: ?Recent Labs  ?Lab 09/24/21 ?1015 09/26/21 ?1100  ?NA 144 142  ?K 3.2* 3.3*  ?CL 111 106  ?CO2 26 26  ?GLUCOSE 93 114*  ?BUN 13 13  ?CREATININE 0.75 0.83  ?CALCIUM 9.9 10.2  ? ?GFR: ?CrCl cannot be calculated (Unknown ideal weight.). ?Liver Function Tests: ?Recent Labs  ?Lab  09/26/21 ?1100  ?AST 17  ?ALT 10  ?ALKPHOS 133*  ?BILITOT 1.0  ?PROT 6.8  ?ALBUMIN 3.3*  ? ?Recent Labs  ?Lab 09/26/21 ?1100  ?LIPASE 26  ? ?No results for input(s): AMMONIA in the last 168 hours. ?Coag

## 2021-09-26 NOTE — Significant Event (Signed)
Rapid Response Event Note  ? ?Reason for Call :  ?Bradycardia ? ?Initial Focused Assessment:  ?Called by charge RN to come and assess this patient. Patient has ICD currently, but the RN reported that his HR was in the low 30's. 12-lead EKG shows HR at 79 with PACs, left axis deviation, and LV hypertrophy. Patient is lying in bed, staring out the window, and only making audible groans.  ? ?Patient placed on teli-box for closer HR monitoring. Heart rate on teli-box not correlating with the one from the dynamap. I recommend using the HR from the teli-box. Other vitals are WNL ? ?BP 129/93 ?HR 83 ?O2 99% on room air ?RR 14 ? ? ?MD Notified: Dr. Roosevelt Locks  ?Call Time:1700 ?Arrival Time: 9935 ?End Time: 7017 ? ?Venetia Maxon, RN ?

## 2021-09-26 NOTE — Progress Notes (Addendum)
New Admission Note:  ? ?Arrival Method: stretcher  ?Mental Orientation: lethargic not able to assess orientation. ?Telemetry: Box 18  ?Assessment: Completed ?Skin: stage 3 on sacrum  ?IV: right forearm  ?Pain: face in and out of sleep 0/10  ?Tubes: none ?Safety Measures: Safety Fall Prevention Plan has been given, discussed and signed ?Admission: Completed ?[facility] Orientation: Patient has been orientated to the room, unit and staff.  ?Family: none  ? ?Orders have been reviewed and implemented. Will continue to monitor the patient. Call light has been placed within reach and bed alarm has been activated.  ? ?Patient is very lethargic on arrival to the unit he was bradycardic  in the 30s rapid was call and MD was notified. Will continue to monitor. ? ? ?Ladajah Soltys RN ?[place] [facility] ?Phone: [phone]  ?

## 2021-09-26 NOTE — ED Triage Notes (Signed)
Per the SORT tech pt was dropped off by family that reports his catheter is out and he needs his urine checked. He was brought back to triage without anyone with him.  Pt incontinent of urine and has difficulty communicating.  Does point to his foot and says it hurts. ? ?Gerald Stabs, [place] called granddaughter that states she is in [place] and that is suprapubic catheter is out.  Requested family member to come to ED with pt. ?

## 2021-09-26 NOTE — ED Notes (Signed)
ED TO INPATIENT HANDOFF REPORT ? ?ED Nurse Name and Phone #: Baxter Flattery, RN ? ?S ?Name/Age/Gender ?Todd Dunn ?81 y.o. ?male ?Room/Bed: 040C/040C ? ?Code Status ?  Code Status: Full Code ? ?Home/SNF/Other ?Home ?Patient oriented to: self ?Is this baseline? Yes  ? ?Triage Complete: Triage complete  ?Chief Complaint ?UTI due to extended-spectrum beta lactamase (ESBL) producing Escherichia coli [N39.0, B96.29, Z16.12] ? ?Triage Note ?Per the SORT tech pt was dropped off by family that reports his catheter is out and he needs his urine checked. He was brought back to triage without anyone with him.  Pt incontinent of urine and has difficulty communicating.  Does point to his foot and says it hurts. ? ?Gerald Stabs, [place] called granddaughter that states she is in [place] and that is suprapubic catheter is out.  Requested family member to come to ED with pt.  ? ?Allergies ?Allergies  ?Allergen Reactions  ? Food Anaphylaxis and Rash  ?  Clams  ? Aspirin Itching  ? [place] Clam (M. Mercenaria) Skin Test Itching  ? ? ?Level of Care/Admitting Diagnosis ?ED Disposition   ? ? ED Disposition  ?Admit  ? Condition  ?--  ? Comment  ?Hospital Area: [facility] [[id number]] ? Level of Care: Med-Surg [16] ? May place patient in observation at [facility] or [place] if equivalent level of care is available:: No ? Covid Evaluation: Asymptomatic - no recent exposure (last 10 days) testing not required ? Diagnosis: UTI due to extended-spectrum beta lactamase (ESBL) producing Escherichia coli [[id number]] ? Admitting Physician: Lequita Halt [[id number]] ? Attending Physician: Lequita Halt [[id number]] ?  ?  ? ?  ? ? ?B ?Medical/Surgery History ?Past Medical History:  ?Diagnosis Date  ? BPH (benign prostatic hyperplasia)   ? CAD (coronary artery disease)   ? Chronic indwelling Foley catheter   ? CVA (cerebral vascular accident) [facility])   ? GERD (gastroesophageal reflux disease)   ? HTN (hypertension)   ? Neurogenic bladder   ? SSS  (sick sinus syndrome) ([place])   ? s/p MDT ICD  ? Vitamin D deficiency   ? ?Past Surgical History:  ?Procedure Laterality Date  ? ABDOMINAL AORTOGRAM W/LOWER EXTREMITY N/A 03/23/2021  ? Procedure: ABDOMINAL AORTOGRAM W/LOWER EXTREMITY;  Surgeon: Waynetta Sandy, MD;  Location: [place] [facility];  Service: Cardiovascular;  Laterality: N/A;  ? AMPUTATION Right 04/01/2021  ? Procedure: RIGHT GREAT TOE AMPUTATION;  Surgeon: Newt Minion, MD;  Location: [place];  Service: Orthopedics;  Laterality: Right;  ? AMPUTATION Right 07/30/2021  ? Procedure: AMPUTATION ABOVE KNEE;  Surgeon: Broadus John, MD;  Location: [place];  Service: Vascular;  Laterality: Right;  ? APPENDECTOMY    ? APPLICATION OF WOUND VAC Right 04/01/2021  ? Procedure: APPLICATION OF WOUND VAC;  Surgeon: Newt Minion, MD;  Location: [place];  Service: Orthopedics;  Laterality: Right;  ? CARDIAC SURGERY    ? FEMORAL-TIBIAL BYPASS GRAFT Right 03/26/2021  ? Procedure: BYPASS GRAFT FEMORAL-TIBIAL ARTERY.  Harvest right leg saphenous vein, right vein patch angioplasty posterior tibial.;  Surgeon: Marty Heck, MD;  [facility];  Service: Vascular;  Laterality: Right;  ? IR US GUIDE BX ASP/DRAIN  08/03/2021  ? IR US GUIDE BX ASP/DRAIN  08/06/2021  ? PACEMAKER PLACEMENT    ?  ? ?A ?IV Location/Drains/Wounds ?Patient Lines/Drains/Airways Status   ? ? Active Line/Drains/Airways   ? ? Name Placement date Placement time Site Days  ? Peripheral IV  09/26/21 22 G Anterior;Right Forearm 09/26/21  1117  Forearm  less than 1  ? Negative Pressure Wound Therapy Foot Anterior;Right;Circumferential 04/01/21  1208  --  178  ? Suprapubic Catheter 12 Fr. 09/26/21  1215  --  less than 1  ? Incision (Closed) 03/26/21 Groin Right 03/26/21  1049  -- 184  ? Incision (Closed) 03/26/21 Leg Right 03/26/21  1049  -- 184  ? Incision (Closed) 04/01/21 Foot Right 04/01/21  1131  -- 178  ? Incision (Closed) 07/30/21 Leg Right 07/30/21  1617  -- 58  ? Wound / Incision (Open or  Dehisced) 03/21/21 Other (Comment) Toe (Comment  which one) Right;Posterior purple, intact 03/21/21  0010  Toe (Comment  which one)  189  ? ?  ?  ? ?  ? ? ?Intake/Output Last 24 hours ? ?Intake/Output Summary (Last 24 hours) at 09/26/2021 1550 ?Last data filed at 09/26/2021 1315 ?Gross per 24 hour  ?Intake 96.89 ml  ?Output --  ?Net 96.89 ml  ? ? ?Labs/Imaging ?Results for orders placed or performed during the hospital encounter of 09/26/21 (from the past 48 hour(s))  ?CBC with Differential     Status: Abnormal  ? Collection Time: 09/26/21 11:00 AM  ?Result Value Ref Range  ? WBC 5.9 4.0 - 10.5 K/uL  ? RBC 4.99 4.22 - 5.81 MIL/uL  ? Hemoglobin 14.0 13.0 - 17.0 g/dL  ? HCT 44.8 39.0 - 52.0 %  ? MCV 89.8 80.0 - 100.0 fL  ? MCH 28.1 26.0 - 34.0 pg  ? MCHC 31.3 30.0 - 36.0 g/dL  ? RDW 17.0 (H) 11.5 - 15.5 %  ? Platelets 183 150 - 400 K/uL  ? nRBC 0.0 0.0 - 0.2 %  ? Neutrophils Relative % 73 %  ? Neutro Abs 4.3 1.7 - 7.7 K/uL  ? Lymphocytes Relative 17 %  ? Lymphs Abs 1.0 0.7 - 4.0 K/uL  ? Monocytes Relative 9 %  ? Monocytes Absolute 0.5 0.1 - 1.0 K/uL  ? Eosinophils Relative 1 %  ? Eosinophils Absolute 0.0 0.0 - 0.5 K/uL  ? Basophils Relative 0 %  ? Basophils Absolute 0.0 0.0 - 0.1 K/uL  ? Immature Granulocytes 0 %  ? Abs Immature Granulocytes 0.02 0.00 - 0.07 K/uL  ?  Comment: Performed at [facility], [place] [address] [place], [place] [address]  ?Comprehensive metabolic panel     Status: Abnormal  ? Collection Time: 09/26/21 11:00 AM  ?Result Value Ref Range  ? Sodium 142 135 - 145 mmol/L  ? Potassium 3.3 (L) 3.5 - 5.1 mmol/L  ? Chloride 106 98 - 111 mmol/L  ? CO2 26 22 - 32 mmol/L  ? Glucose, Bld 114 (H) 70 - 99 mg/dL  ?  Comment: Glucose reference range applies only to samples taken after fasting for at least 8 hours.  ? BUN 13 8 - 23 mg/dL  ? Creatinine, Ser 0.83 0.61 - 1.24 mg/dL  ? Calcium 10.2 8.9 - 10.3 mg/dL  ? Total Protein 6.8 6.5 - 8.1 g/dL  ? Albumin 3.3 (L) 3.5 - 5.0 g/dL  ? AST 17 15 - 41 U/L  ? ALT  10 0 - 44 U/L  ? Alkaline Phosphatase 133 (H) 38 - 126 U/L  ? Total Bilirubin 1.0 0.3 - 1.2 mg/dL  ? GFR, Estimated >60 >60 mL/min  ?  Comment: (NOTE) ?Calculated using the CKD-EPI Creatinine Equation (2021) ?  ? Anion gap 10 5 - 15  ?  Comment:  Performed at [facility], [place] [address], [place], [place] [address]  ?Lipase, blood     Status: None  ? Collection Time: 09/26/21 11:00 AM  ?Result Value Ref Range  ? Lipase 26 11 - 51 U/L  ?  Comment: Performed at [facility], [place] [address] [place], [place] [address]  ? ?DG Chest Portable 1 View ? ?Result Date: 09/26/2021 ?CLINICAL DATA:  Screening.  Suprapubic catheter fell out. EXAM: PORTABLE CHEST 1 VIEW COMPARISON:  AP chest 08/11/2021 FINDINGS: Status post median sternotomy. Left chest wall cardiac AICD with leads again overlying the right atrium and right ventricle. Cardiac silhouette is at the upper limits of normal size. Mediastinal contours are within normal limits. Mild-to-moderate calcification within the aortic arch. The lungs are clear. No pleural effusion or pneumothorax. Degenerative bridging osteophytes of the thoracic spine. IMPRESSION: No acute cardiopulmonary disease process. Electronically Signed   By: Yvonne Kendall M.D.   On: 09/26/2021 11:53   ? ?Pending Labs ?Unresulted Labs (From admission, onward)  ? ?  Start     Ordered  ? 09/26/21 1302  Magnesium  Add-on,   AD       ? 09/26/21 1301  ? 09/26/21 1302  Phosphorus  Add-on,   AD       ? 09/26/21 1301  ? 09/26/21 1028  Urinalysis, Routine w reflex microscopic  (ED Abdominal Pain)  Once,   URGENT       ? 09/26/21 1027  ? ?  ?  ? ?  ? ? ?Vitals/Pain ?Today's Vitals  ? 09/26/21 0916 09/26/21 9147 09/26/21 1415 09/26/21 1441  ?BP:  (!) 102/54 122/63 (!) 112/57  ?Pulse:  89    ?Resp:  16 16   ?Temp:  98.1 ?F (36.7 ?C)    ?TempSrc:  Oral    ?SpO2:  (!) 83% 93%   ?PainSc: 5      ? ? ?Isolation Precautions ?No active isolations ? ?Medications ?Medications  ?meropenem (MERREM) 1 g in sodium  chloride 0.9 % 100 mL IVPB (0 g Intravenous Stopped 09/26/21 1315)  ?HYDROcodone-acetaminophen (NORCO/VICODIN) 5-325 MG per tablet 1 tablet (has no administration in time range)  ?amLODipine (NORVASC) table

## 2021-09-26 NOTE — ED Provider Notes (Signed)
?[facility] ?Provider Note ? ? ?CSN: [id number] ?Arrival date & time: 09/26/21  0254 ? ?  ? ?History ? ?Chief Complaint  ?Patient presents with  ? Catheter Problem  ? ? ?Todd Dunn is a 81 y.o. male. ? ? Patient as above with significant medical history as below, including BPH, CAD, GERD, CVA who presents to the ED with complaint of suprapubic catheter coming out. ? ?Patient accompanied by family.  Family reports that sometime in the night patient is believed to have pulled out his suprapubic catheter.  Follows with [facility].  Catheter was placed over 6 months ago per family at bedside.  Patient is complaining of some suprapubic discomfort at catheter site.  No nausea or vomiting reported by family.  He is more agitated than typical. No fevers reported.  ? ?Patient has severe difficulty communicating at baseline- level 5 caveat ? ? ? ?Past Medical History: ?No date: BPH (benign prostatic hyperplasia) ?No date: CAD (coronary artery disease) ?No date: Chronic indwelling Foley catheter ?No date: CVA (cerebral vascular accident) ([place]) ?No date: GERD (gastroesophageal reflux disease) ?No date: HTN (hypertension) ?No date: Neurogenic bladder ?No date: SSS (sick sinus syndrome) ([place]) ?    Comment:  s/p MDT ICD ?No date: Vitamin D deficiency ? ?Past Surgical History: ?03/23/2021: ABDOMINAL AORTOGRAM W/LOWER EXTREMITY; N/A ?    Comment:  Procedure: ABDOMINAL AORTOGRAM W/LOWER EXTREMITY;   ?             Surgeon: Waynetta Sandy, MD;  Location: [facility]  ?             INVASIVE CV LAB;  Service: Cardiovascular;  Laterality:  ?             N/A; ?04/01/2021: AMPUTATION; Right ?    Comment:  Procedure: RIGHT GREAT TOE AMPUTATION;  Surgeon: Sharol Given,  ?             Illene Regulus, MD;  Location: [facility];  Service: Orthopedics;   ?             Laterality: Right; ?07/30/2021: AMPUTATION; Right ?    Comment:  Procedure: AMPUTATION ABOVE KNEE;  Surgeon: Virl Cagey,  ?             Carolann Littler, MD;   Location: [facility];  Service: Vascular;   ?             Laterality: Right; ?No date: APPENDECTOMY ?27/11/2374: APPLICATION OF WOUND VAC; Right ?    Comment:  Procedure: APPLICATION OF WOUND VAC;  Surgeon: Sharol Given,  ?             Illene Regulus, MD;  Location: [facility];  Service: Orthopedics;   ?             Laterality: Right; ?No date: CARDIAC SURGERY ?03/26/2021: FEMORAL-TIBIAL BYPASS GRAFT; Right ?    Comment:  Procedure: BYPASS GRAFT FEMORAL-TIBIAL ARTERY.  [place]  ?             right leg saphenous vein, right vein patch angioplasty  ?             posterior tibial.;  Surgeon: Marty Heck, MD;   ?             Location: [facility] OR;  Service: Vascular;  Laterality: Right; ?08/03/2021: IR US GUIDE BX ASP/DRAIN ?08/06/2021: IR US GUIDE BX ASP/DRAIN ?No date: PACEMAKER PLACEMENT  ? ? ?The history is provided by the patient. No language  interpreter was used.  ? ?  ? ?Home Medications ?Prior to Admission medications   ?Medication Sig Start Date End Date Taking? Authorizing Provider  ?albuterol (VENTOLIN HFA) 108 (90 Base) MCG/ACT inhaler Inhale 2 puffs into the lungs every 6 (six) hours as needed for wheezing or shortness of breath. 06/24/21   Terrilee Croak, MD  ?amLODipine (NORVASC) 10 MG tablet Take 1 tablet (10 mg total) by mouth daily. 07/17/21   [name], NP  ?cephALEXin (KEFLEX) 500 MG capsule Take 1 capsule (500 mg total) by mouth 4 (four) times daily. 09/24/21   Domenic Moras, PA-C  ?clopidogrel (PLAVIX) 75 MG tablet Take 1 tablet (75 mg total) by mouth at bedtime. 07/17/21   [name], NP  ?divalproex (DEPAKOTE) 125 MG DR tablet Take 1 tablet (125 mg total) by mouth at bedtime. 07/17/21 10/15/21  [name], NP  ?HYDROcodone-acetaminophen (NORCO) 5-325 MG tablet Take 1 tablet by mouth every 6 (six) hours as needed for moderate pain. ?Patient taking differently: Take 1 tablet by mouth every 8 (eight) hours. 08/04/21   Dessa Phi, DO  ?HYDROcodone-acetaminophen (NORCO/VICODIN) 5-325 MG tablet Take 1 tablet by  mouth every 6 (six) hours as needed for moderate pain.    [provider]  ?LORazepam (ATIVAN) 0.5 MG tablet Take 0.5 mg by mouth See admin instructions. Every 6 hours as needed for anxiety x 14 days    [provider]  ?nitroGLYCERIN (NITROSTAT) 0.4 MG SL tablet Place 0.4 mg under the tongue every 5 (five) minutes x 3 doses as needed for chest pain.    [provider]  ?oxybutynin (DITROPAN) 5 MG tablet Take 1 tablet (5 mg total) by mouth every 8 (eight) hours as needed for bladder spasms. 08/29/21   Blanchie Dessert, MD  ?pantoprazole (PROTONIX) 40 MG tablet TAKE 1 TABLET(40 MG) BY MOUTH DAILY ?Patient taking differently: Take 40 mg by mouth daily. 07/21/21   Charlott Rakes, MD  ?polyethylene glycol (MIRALAX / GLYCOLAX) 17 g packet Take 17 g by mouth daily as needed for mild constipation. 07/17/21   [name], NP  ?rosuvastatin (CRESTOR) 10 MG tablet Take 1 tablet (10 mg total) by mouth every morning. 07/17/21   [name], NP  ?vitamin B-12 (CYANOCOBALAMIN) 1000 MCG tablet Take 1 tablet (1,000 mcg total) by mouth daily. 07/17/21   [name], NP  ?Vitamin D, Ergocalciferol, (DRISDOL) 1.25 MG (50000 UNIT) CAPS capsule Take 50,000 Units by mouth every Monday.    [provider]  ?   ? ?Allergies    ?Food, Aspirin, and [place] clam (m. mercenaria) skin test   ? ?Review of Systems   ?Review of Systems  ?Constitutional:  Negative for chills and fever.  ?HENT:  Negative for facial swelling and trouble swallowing.   ?Eyes:  Negative for photophobia and visual disturbance.  ?Respiratory:  Negative for cough and shortness of breath.   ?Cardiovascular:  Negative for chest pain and palpitations.  ?Gastrointestinal:  Positive for abdominal pain. Negative for nausea and vomiting.  ?Endocrine: Negative for polydipsia and polyuria.  ?Genitourinary:  Positive for difficulty urinating. Negative for hematuria.  ?Musculoskeletal:  Negative for gait problem and joint  swelling.  ?Skin:  Negative for pallor and rash.  ?Neurological:  Negative for syncope and headaches.  ?Psychiatric/Behavioral:  Positive for agitation. Negative for confusion.   ? ?Physical Exam ?Updated Vital Signs ?BP 124/70 (BP Location: Left Arm)   Pulse 87   Temp (!) 97.4 ?F (36.3 ?C) (  Oral)   Resp 18   SpO2 100%  ?Physical Exam ?Vitals and nursing note reviewed.  ?Constitutional:   ?   General: He is not in acute distress. ?   Appearance: He is well-developed. He is not toxic-appearing.  ?   Comments: fRail appearing  ?HENT:  ?   Head: Normocephalic and atraumatic.  ?   Right Ear: External ear normal.  ?   Left Ear: External ear normal.  ?   Mouth/Throat:  ?   Mouth: Mucous membranes are moist.  ?Eyes:  ?   General: No scleral icterus. ?Cardiovascular:  ?   Rate and Rhythm: Normal rate and regular rhythm.  ?   Pulses: Normal pulses.  ?   Heart sounds: Normal heart sounds.  ?Pulmonary:  ?   Effort: Pulmonary effort is normal. No respiratory distress.  ?   Breath sounds: Normal breath sounds.  ?Abdominal:  ?   General: Abdomen is flat.  ?   Palpations: Abdomen is soft.  ?   Tenderness: There is abdominal tenderness in the suprapubic area.  ? ? ?   Comments: Suprapubic catheter site - location of tenderness  ?Musculoskeletal:     ?   General: Normal range of motion.  ?   Cervical back: Normal range of motion.  ?   Right lower leg: No edema.  ?   Left lower leg: No edema.  ?Skin: ?   General: Skin is warm and dry.  ?   Capillary Refill: Capillary refill takes less than 2 seconds.  ?Neurological:  ?   Mental Status: He is alert and oriented to person, place, and time.  ?Psychiatric:     ?   Mood and Affect: Mood normal.     ?   Behavior: Behavior normal.  ? ? ?ED Results / Procedures / Treatments   ?Labs ?(all labs ordered are listed, but only abnormal results are displayed) ?Labs Reviewed  ?CBC WITH DIFFERENTIAL/PLATELET - Abnormal; Notable for the following components:  ?    Result Value  ? RDW 17.0 (*)   ?  All other components within normal limits  ?COMPREHENSIVE METABOLIC PANEL - Abnormal; Notable for the following components:  ? Potassium 3.3 (*)   ? Glucose, Bld 114 (*)   ? Albumin 3.3 (*)   ? Alkaline Phosphat

## 2021-09-26 NOTE — Progress Notes (Signed)
RRT at 17:20s for bradycardia episode during patient was transferred from ED bed to 247M bed. I reviewed the 247M Tele records and patient's PPM seems working fine and pacing well. Suspect artifects. And nursing report patient had normal appearance when this happened. RRT team did a EKG which showed normal HR. Please review Tele record overnight and consider interrogation of PPM. ?

## 2021-09-26 NOTE — ED Provider Triage Note (Signed)
Emergency Medicine Provider Triage Evaluation Note ? ?Todd Dunn , a 81 y.o. male  was evaluated in triage.  Pt complains of unsure.  Patient arrives without home health aide, family.  Patient indicates that he is having some sort of issue with his catheter, patient appears to be incontinent.  Based on chart review, patient was seen here recently for suspected UTI.  Diagnosed with UTI, urine culture did show to have E. coli.  Patient placed on Keflex at this time.  Story unsure. ? ?Review of Systems  ?Positive:  ?Negative:  ? ?Physical Exam  ?BP (!) 151/115   Pulse 98   Temp 98 ?F (36.7 ?C)   Resp 16   SpO2 99%  ?Gen:   Awake, no distress   ?Resp:  Normal effort  ?MSK:   Moves extremities without difficulty  ?Other:   ? ?Medical Decision Making  ?Medically screening exam initiated at 9:18 AM.  Appropriate orders placed.  Masai L Ricciuti was informed that the remainder of the evaluation will be completed by another provider, this initial triage assessment does not replace that evaluation, and the importance of remaining in the ED until their evaluation is complete. ? ? ?  ?Azucena Cecil, PA-C ?09/26/21 0919 ? ?

## 2021-09-27 DIAGNOSIS — I110 Hypertensive heart disease with heart failure: Secondary | ICD-10-CM | POA: Diagnosis present

## 2021-09-27 DIAGNOSIS — R32 Unspecified urinary incontinence: Secondary | ICD-10-CM | POA: Diagnosis present

## 2021-09-27 DIAGNOSIS — K219 Gastro-esophageal reflux disease without esophagitis: Secondary | ICD-10-CM | POA: Diagnosis present

## 2021-09-27 DIAGNOSIS — I69320 Aphasia following cerebral infarction: Secondary | ICD-10-CM | POA: Diagnosis not present

## 2021-09-27 DIAGNOSIS — E860 Dehydration: Secondary | ICD-10-CM | POA: Diagnosis present

## 2021-09-27 DIAGNOSIS — Z9359 Other cystostomy status: Secondary | ICD-10-CM

## 2021-09-27 DIAGNOSIS — Z87891 Personal history of nicotine dependence: Secondary | ICD-10-CM | POA: Diagnosis not present

## 2021-09-27 DIAGNOSIS — Z79899 Other long term (current) drug therapy: Secondary | ICD-10-CM | POA: Diagnosis not present

## 2021-09-27 DIAGNOSIS — N390 Urinary tract infection, site not specified: Secondary | ICD-10-CM

## 2021-09-27 DIAGNOSIS — B9629 Other Escherichia coli [E. coli] as the cause of diseases classified elsewhere: Secondary | ICD-10-CM | POA: Diagnosis not present

## 2021-09-27 DIAGNOSIS — T83510A Infection and inflammatory reaction due to cystostomy catheter, initial encounter: Secondary | ICD-10-CM | POA: Diagnosis present

## 2021-09-27 DIAGNOSIS — I5042 Chronic combined systolic (congestive) and diastolic (congestive) heart failure: Secondary | ICD-10-CM | POA: Diagnosis present

## 2021-09-27 DIAGNOSIS — Z1612 Extended spectrum beta lactamase (ESBL) resistance: Secondary | ICD-10-CM

## 2021-09-27 DIAGNOSIS — N3001 Acute cystitis with hematuria: Secondary | ICD-10-CM | POA: Diagnosis present

## 2021-09-27 DIAGNOSIS — A499 Bacterial infection, unspecified: Secondary | ICD-10-CM | POA: Diagnosis not present

## 2021-09-27 DIAGNOSIS — E559 Vitamin D deficiency, unspecified: Secondary | ICD-10-CM | POA: Diagnosis present

## 2021-09-27 DIAGNOSIS — I2510 Atherosclerotic heart disease of native coronary artery without angina pectoris: Secondary | ICD-10-CM | POA: Diagnosis present

## 2021-09-27 DIAGNOSIS — R410 Disorientation, unspecified: Secondary | ICD-10-CM | POA: Diagnosis not present

## 2021-09-27 DIAGNOSIS — L89153 Pressure ulcer of sacral region, stage 3: Secondary | ICD-10-CM | POA: Diagnosis present

## 2021-09-27 DIAGNOSIS — Z681 Body mass index (BMI) 19 or less, adult: Secondary | ICD-10-CM | POA: Diagnosis not present

## 2021-09-27 DIAGNOSIS — Z7902 Long term (current) use of antithrombotics/antiplatelets: Secondary | ICD-10-CM | POA: Diagnosis not present

## 2021-09-27 DIAGNOSIS — Y846 Urinary catheterization as the cause of abnormal reaction of the patient, or of later complication, without mention of misadventure at the time of the procedure: Secondary | ICD-10-CM | POA: Diagnosis present

## 2021-09-27 DIAGNOSIS — I739 Peripheral vascular disease, unspecified: Secondary | ICD-10-CM | POA: Diagnosis present

## 2021-09-27 DIAGNOSIS — L8990 Pressure ulcer of unspecified site, unspecified stage: Secondary | ICD-10-CM | POA: Insufficient documentation

## 2021-09-27 DIAGNOSIS — N400 Enlarged prostate without lower urinary tract symptoms: Secondary | ICD-10-CM | POA: Diagnosis present

## 2021-09-27 DIAGNOSIS — B9620 Unspecified Escherichia coli [E. coli] as the cause of diseases classified elsewhere: Secondary | ICD-10-CM | POA: Diagnosis present

## 2021-09-27 DIAGNOSIS — E46 Unspecified protein-calorie malnutrition: Secondary | ICD-10-CM | POA: Diagnosis present

## 2021-09-27 DIAGNOSIS — G9341 Metabolic encephalopathy: Secondary | ICD-10-CM | POA: Diagnosis present

## 2021-09-27 DIAGNOSIS — I495 Sick sinus syndrome: Secondary | ICD-10-CM | POA: Diagnosis present

## 2021-09-27 DIAGNOSIS — E876 Hypokalemia: Secondary | ICD-10-CM | POA: Diagnosis present

## 2021-09-27 DIAGNOSIS — N319 Neuromuscular dysfunction of bladder, unspecified: Secondary | ICD-10-CM | POA: Diagnosis present

## 2021-09-27 LAB — PHOSPHORUS: Phosphorus: 2.6 mg/dL (ref 2.5–4.6)

## 2021-09-27 LAB — MAGNESIUM: Magnesium: 2.6 mg/dL — ABNORMAL HIGH (ref 1.7–2.4)

## 2021-09-27 MED ORDER — HALOPERIDOL LACTATE 5 MG/ML IJ SOLN: 5.0000 mg | Freq: Four times a day (QID) | INTRAMUSCULAR | Status: DC | PRN

## 2021-09-27 MED ORDER — HALOPERIDOL LACTATE 5 MG/ML IJ SOLN
5.0000 mg | Freq: Four times a day (QID) | INTRAMUSCULAR | Status: DC | PRN
  Administered 2021-09-27: 5 mg via INTRAMUSCULAR

## 2021-09-27 MED ORDER — HALOPERIDOL LACTATE 5 MG/ML IJ SOLN
INTRAMUSCULAR | Status: AC
  Filled 2021-09-27: qty 1

## 2021-09-27 MED ORDER — ENSURE ENLIVE PO LIQD
237.0000 mL | Freq: Three times a day (TID) | ORAL | Status: DC
  Administered 2021-09-27 – 2021-10-02 (×16): 237 mL via ORAL

## 2021-09-27 MED ORDER — K PHOS MONO-SOD PHOS DI & MONO 155-852-130 MG PO TABS
500.0000 mg | ORAL_TABLET | Freq: Two times a day (BID) | ORAL | Status: AC
Start: 2021-09-27 — End: 2021-09-28
  Administered 2021-09-27 – 2021-09-28 (×3): 500 mg via ORAL
  Filled 2021-09-27 (×4): qty 2

## 2021-09-27 NOTE — Telephone Encounter (Signed)
Post ED Visit - Positive Culture Follow-up ? ?Culture report reviewed by antimicrobial stewardship pharmacist: ?[facility] Team ?'[x]'$  Lorelei Pont, Pharm.D. ?'[]'$  Heide Guile, Pharm.D., BCPS AQ-ID ?'[]'$  Parks Neptune, Pharm.D., BCPS ?'[]'$  Alycia Rossetti, Pharm.D., BCPS ?'[]'$  Waterman, Pharm.D., BCPS, AAHIVP ?'[]'$  Legrand Como, Pharm.D., BCPS, AAHIVP ?'[]'$  Salome Arnt, PharmD, BCPS ?'[]'$  Johnnette Gourd, PharmD, BCPS ?'[]'$  Hughes Better, PharmD, BCPS ?'[]'$  Leeroy Cha, PharmD ?'[]'$  Laqueta Linden, PharmD, BCPS ?'[]'$  Albertina Parr, PharmD ? ?[facility] Team ?'[]'$  Leodis Sias, PharmD ?'[]'$  Lindell Spar, PharmD ?'[]'$  Royetta Asal, PharmD ?'[]'$  Graylin Shiver, Rph ?'[]'$  Rema Fendt) Glennon Mac, PharmD ?'[]'$  Arlyn Dunning, PharmD ?'[]'$  Netta Cedars, PharmD ?'[]'$  Dia Sitter, PharmD ?'[]'$  Leone Haven, PharmD ?'[]'$  Gretta Arab, PharmD ?'[]'$  Theodis Shove, PharmD ?'[]'$  Peggyann Juba, PharmD ?'[]'$  Reuel Boom, PharmD ? ? ?Positive urine culture ?Pt admitted on 60M and currently on Merrem. No action needed. ?Todd Dunn ?09/27/2021, 1:22 PM ?  ?

## 2021-09-27 NOTE — Progress Notes (Signed)
TRH night cross cover note: ? ?I was notified by RN that the patient remains somnolent, responding to his name, but unable to reliably follow commands and therefore unable to participate in nursing bedside swallow screen.  Consequently, this evening's scheduled doses of Plavix and Depakote will be held. ? ? ? ?Babs Bertin, DO ?Hospitalist ? ?

## 2021-09-27 NOTE — Progress Notes (Signed)
?PROGRESS NOTE ? ? ? ?Todd Dunn  TGG:[id number] DOB: 04-20-1941 DOA: 09/26/2021 ?PCP: [name], NP  ? ? ?Chief Complaint  ?Patient presents with  ? Catheter Problem  ? ? ?Brief Narrative:  ? ?  ?Todd Dunn is a 81 y.o. male with medical history significant of indwelling suprapubic urinary catheter secondary to severe BPH, CAD, PVD status post right AKA, SSS status post ICD, HTN, CVA with chronic aphasia, presented with worsening of suprapubic abdominal pain and mentation changes. ?  ?Patient has chronic aphasia which without restriction from the stroke, or history provided patient's granddaughter over the phone.  Granddaughter reported that the patient has been complaining about suprapubic abdominal pain about 1 week ago, frequent nauseous but no vomiting and decreased oral intake.  She brought him to PCP 3 days ago, and work-up showed patient had a UTI and patient was started on Keflex.  Despite taking antibiotics for last 2 days, patient continued to experience worsening of suprapubic pain and worsening of confusion and agitation.  Last night, patient pulled out suprapubic catheter.  No fever as per family. ?  ?ED Course: No tachycardia no hypotension, afebrile.  WBC 5.9, creatinine 0.8, K3.3.  Suprapubic catheter was changed in ED.  Urine culture from 2 days ago showed ESBL UTI, sensitive to imipenem. ? ?Assessment & Plan: ?  ?Principal Problem: ?  UTI due to extended-spectrum beta lactamase (ESBL) producing Escherichia coli ?Active Problems: ?  History of ESBL E. coli infection ?  Hypokalemia ?  Dehydration ?  Pressure injury of skin ? ?ESBL UTI due to suprapubic catheter ?-Outpatient urine culture growing ESBL, which Lexazin effective, continue with imipenem during hospital stay, can be switched to ertapenem daily upon discharge, will need 7 days treatment. ?-Suprapubic catheter was reinserted in ED. ? ?Acute metabolic encephalopathy ?-Secondary to ESBL UTI ?- family requested no Ativan  for this patient as likelihood of precipitating further mentation changes.  Which is appropriate, if needed can use as needed Haldol ?  ?Hypokalemia ?-repleted ?  ?Severe dehydration ?-From poor oral intake ?-continue with IV fluids ?  ?Malfunction of suprapubic urine catheter ?-Suprapubic catheter replaced in ED. ?  ?HTN ?-Stable, continue home meds. ?  ?PVD ?-Continue Plavix ?- S/P Right AKA ? ?Chronic combined systolic and diastolic CHF ?- S/P AICD  ?-Monitor closely for volume overload as on IV fluids ? ?History of CVA with residual dysarthria, dysphagia ? ?SSS ?- S/P AICD ? ?DVT prophylaxis: [place] lovenox ?Code Status: Full ?Family Communication: none at bedside ?Disposition:  ? ?Status is: Inpatient ?Remains inpatient appropriate because: remains Altered on IV ABX(no oral option currently) ?  ?Consultants:  ?None ? ?Subjective: ? ?No significant events overnight as discussed with staff, patient is confused this morning cannot provide any reliable complaints. ? ?Objective: ?Vitals:  ? 09/26/21 2016 09/27/21 0000 09/27/21 0410 09/27/21 0927  ?BP: (!) 128/55 129/68 124/70 (!) 127/52  ?Pulse: (!) 57 80 87 60  ?Resp: '18 18 18 17  '$ ?Temp: 98.8 ?F (37.1 ?C) 97.9 ?F (36.6 ?C) (!) 97.4 ?F (36.3 ?C) (!) 97.4 ?F (36.3 ?C)  ?TempSrc: Oral Oral Oral Axillary  ?SpO2: 100% 100% 100% 99%  ? ? ?Intake/Output Summary (Last 24 hours) at 09/27/2021 1028 ?Last data filed at 09/27/2021 0800 ?Gross per 24 hour  ?Intake 96.89 ml  ?Output 1200 ml  ?Net -1103.11 ml  ? ?There were no vitals filed for this visit. ? ?Examination: ? ? ?Awake, confused, open eyes intermittently, unable to answer any questions  or follow any commands, moaning intermittently, extremely frail, cachectic, chronically  ill-appearing. ?symmetrical Chest wall movement, Good air movement bilaterally, CTAB ?RRR,No Gallops,Rubs or new Murmurs, No Parasternal Heave ?+ve B.Sounds, Abd Soft, No tenderness, No rebound - guarding or rigidity. ?No Cyanosis, Clubbing or edema,  Right AKA ? ? ? ?Data Reviewed: I have personally reviewed following labs and imaging studies ? ?CBC: ?Recent Labs  ?Lab 09/24/21 ?1015 09/26/21 ?1100  ?WBC 5.9 5.9  ?NEUTROABS  --  4.3  ?HGB 12.0* 14.0  ?HCT 39.2 44.8  ?MCV 89.5 89.8  ?PLT 194 183  ? ? ?Basic Metabolic Panel: ?Recent Labs  ?Lab 09/24/21 ?1015 09/26/21 ?1100 09/26/21 ?1858  ?NA 144 142  --   ?K 3.2* 3.3*  --   ?CL 111 106  --   ?CO2 26 26  --   ?GLUCOSE 93 114*  --   ?BUN 13 13  --   ?CREATININE 0.75 0.83  --   ?CALCIUM 9.9 10.2  --   ?MG  --   --  2.6*  ?PHOS  --   --  2.6  ? ? ?GFR: ?CrCl cannot be calculated (Unknown ideal weight.). ? ?Liver Function Tests: ?Recent Labs  ?Lab 09/26/21 ?1100  ?AST 17  ?ALT 10  ?ALKPHOS 133*  ?BILITOT 1.0  ?PROT 6.8  ?ALBUMIN 3.3*  ? ? ?CBG: ?No results for input(s): GLUCAP in the last 168 hours. ? ? ?Recent Results (from the past 240 hour(s))  ?Urine Culture     Status: Abnormal  ? Collection Time: 09/24/21 10:25 AM  ? Specimen: Urine, Clean Catch  ?Result Value Ref Range Status  ? Specimen Description URINE, CLEAN CATCH  Final  ? Special Requests   Final  ?  NONE ?Performed at [facility], [place] [address], [place], [place] [address] ?  ? Culture (A)  Final  ?  >=100,000 COLONIES/mL ESCHERICHIA COLI ?Confirmed Extended Spectrum Beta-Lactamase Producer (ESBL).  In bloodstream infections from ESBL organisms, carbapenems are preferred over piperacillin/tazobactam. They are shown to have a lower risk of mortality. ?  ? Report Status 09/26/2021 FINAL  Final  ? Organism ID, Bacteria ESCHERICHIA COLI (A)  Final  ?    Susceptibility  ? Escherichia coli - MIC*  ?  AMPICILLIN >=32 RESISTANT Resistant   ?  CEFAZOLIN >=64 RESISTANT Resistant   ?  CEFEPIME 16 RESISTANT Resistant   ?  CEFTRIAXONE >=64 RESISTANT Resistant   ?  CIPROFLOXACIN >=4 RESISTANT Resistant   ?  GENTAMICIN >=16 RESISTANT Resistant   ?  IMIPENEM <=0.25 SENSITIVE Sensitive   ?  NITROFURANTOIN <=16 SENSITIVE Sensitive   ?  TRIMETH/SULFA >=320 RESISTANT  Resistant   ?  AMPICILLIN/SULBACTAM >=32 RESISTANT Resistant   ?  PIP/TAZO >=128 RESISTANT Resistant   ?  * >=100,000 COLONIES/mL ESCHERICHIA COLI  ?  ? ? ? ? ? ?Radiology Studies: ?DG Chest Portable 1 View ? ?Result Date: 09/26/2021 ?CLINICAL DATA:  Screening.  Suprapubic catheter fell out. EXAM: PORTABLE CHEST 1 VIEW COMPARISON:  AP chest 08/11/2021 FINDINGS: Status post median sternotomy. Left chest wall cardiac AICD with leads again overlying the right atrium and right ventricle. Cardiac silhouette is at the upper limits of normal size. Mediastinal contours are within normal limits. Mild-to-moderate calcification within the aortic arch. The lungs are clear. No pleural effusion or pneumothorax. Degenerative bridging osteophytes of the thoracic spine. IMPRESSION: No acute cardiopulmonary disease process. Electronically Signed   By: Yvonne Kendall M.D.   On: 09/26/2021 11:53   ? ? ? ? ? ?  Scheduled Meds: ? acidophilus  2 capsule Oral Daily  ? amLODipine  10 mg Oral Daily  ? clopidogrel  75 mg Oral QHS  ? divalproex  125 mg Oral QHS  ? enoxaparin (LOVENOX) injection  40 mg Subcutaneous Q24H  ? pantoprazole  40 mg Oral Daily  ? rosuvastatin  10 mg Oral q morning  ? vitamin B-12  1,000 mcg Oral Daily  ? ?Continuous Infusions: ? 0.9 % NaCl with KCl 20 mEq / L 100 mL/hr at 09/27/21 0257  ? meropenem (MERREM) IV 1 g (09/27/21 5638)  ? ? ? LOS: 0 days  ? ? ? ? ? ? ?Phillips Climes, MD ?[facility] ? ? ?To contact the attending provider between 7A-7P or the covering provider during after hours 7P-7A, please log into the web site [email or web address] and access using universal [place] password for that web site. If you do not have the password, please call the hospital operator. ? ?09/27/2021, 10:28 AM  ? ? ?

## 2021-09-27 NOTE — Plan of Care (Signed)
  Problem: Nutrition: Goal: Adequate nutrition will be maintained Outcome: Progressing   

## 2021-09-27 NOTE — Progress Notes (Signed)
PT Cancellation Note ? ?Patient Details ?Name: Todd Dunn ?MRN: [id number] ?DOB: Apr 13, 1941 ? ? ?Cancelled Treatment:    Reason Eval/Treat Not Completed: Other (comment) ? ?Discussed pt with RN, who indicated pt had just settled down after a period of agitation;  ?Will hold PT and plan to return tomorrow for eval;  ? ?Roney Marion, PT  ?Acute Rehabilitation Services ?Office [phone] ? ? ?Todd Dunn ?09/27/2021, 4:02 PM ?

## 2021-09-28 DIAGNOSIS — N390 Urinary tract infection, site not specified: Secondary | ICD-10-CM | POA: Diagnosis not present

## 2021-09-28 DIAGNOSIS — R410 Disorientation, unspecified: Secondary | ICD-10-CM | POA: Diagnosis not present

## 2021-09-28 DIAGNOSIS — E860 Dehydration: Secondary | ICD-10-CM | POA: Diagnosis not present

## 2021-09-28 DIAGNOSIS — B9629 Other Escherichia coli [E. coli] as the cause of diseases classified elsewhere: Secondary | ICD-10-CM | POA: Diagnosis not present

## 2021-09-28 LAB — BASIC METABOLIC PANEL
Anion gap: 5 (ref 5–15)
BUN: 10 mg/dL (ref 8–23)
CO2: 29 mmol/L (ref 22–32)
Calcium: 9.3 mg/dL (ref 8.9–10.3)
Chloride: 105 mmol/L (ref 98–111)
Creatinine, Ser: 0.6 mg/dL — ABNORMAL LOW (ref 0.61–1.24)
GFR, Estimated: >60 mL/min (ref >60)
Glucose, Bld: 106 mg/dL — ABNORMAL HIGH (ref 70–99)
Potassium: 3.6 mmol/L (ref 3.5–5.1)
Sodium: 139 mmol/L (ref 135–145)

## 2021-09-28 LAB — CBC
HCT: 37.1 % — ABNORMAL LOW (ref 39.0–52.0)
Hemoglobin: 11.6 g/dL — ABNORMAL LOW (ref 13.0–17.0)
MCH: 28 pg (ref 26.0–34.0)
MCHC: 31.3 g/dL (ref 30.0–36.0)
MCV: 89.4 fL (ref 80.0–100.0)
Platelets: 156 10*3/uL (ref 150–400)
RBC: 4.15 MIL/uL — ABNORMAL LOW (ref 4.22–5.81)
RDW: 16.8 % — ABNORMAL HIGH (ref 11.5–15.5)
WBC: 5.4 10*3/uL (ref 4.0–10.5)
nRBC: 0 % (ref 0.0–0.2)

## 2021-09-28 LAB — MAGNESIUM: Magnesium: 2.1 mg/dL (ref 1.7–2.4)

## 2021-09-28 LAB — PHOSPHORUS: Phosphorus: 2.2 mg/dL — ABNORMAL LOW (ref 2.5–4.6)

## 2021-09-28 MED ORDER — HALOPERIDOL LACTATE 5 MG/ML IJ SOLN
5.0000 mg | Freq: Four times a day (QID) | INTRAMUSCULAR | Status: DC | PRN
  Administered 2021-09-28 – 2021-10-01 (×4): 5 mg via INTRAMUSCULAR
  Filled 2021-09-28 (×4): qty 1

## 2021-09-28 MED ORDER — SODIUM CHLORIDE 0.9 % IV SOLN: INTRAVENOUS | Status: DC

## 2021-09-28 MED ORDER — POTASSIUM PHOSPHATES 15 MMOLE/5ML IV SOLN
30.0000 mmol | Freq: Once | INTRAVENOUS | Status: AC
  Administered 2021-09-28: 30 mmol via INTRAVENOUS
  Filled 2021-09-28: qty 10

## 2021-09-28 MED ORDER — QUETIAPINE FUMARATE 25 MG PO TABS: 25.0000 mg | ORAL_TABLET | Freq: Every day | ORAL | Status: DC

## 2021-09-28 NOTE — Progress Notes (Signed)
?PROGRESS NOTE ? ? ? ?Todd Dunn  SJG:[id number] DOB: 04-09-1941 DOA: 09/26/2021 ?PCP: [name], NP  ? ? ?Chief Complaint  ?Patient presents with  ? Catheter Problem  ? ? ?Brief Narrative:  ? ?  ?Todd Dunn is a 81 y.o. male with medical history significant of indwelling suprapubic urinary catheter secondary to severe BPH, CAD, PVD status post right AKA, SSS status post ICD, HTN, CVA with chronic aphasia, presented with worsening of suprapubic abdominal pain and mentation changes. ?  ?Patient has chronic aphasia which without restriction from the stroke, or history provided patient's granddaughter over the phone.  Granddaughter reported that the patient has been complaining about suprapubic abdominal pain about 1 week ago, frequent nauseous but no vomiting and decreased oral intake.  She brought him to PCP 3 days ago, and work-up showed patient had a UTI and patient was started on Keflex.  Despite taking antibiotics for last 2 days, patient continued to experience worsening of suprapubic pain and worsening of confusion and agitation, patient pulled out suprapubic catheter.  So she brought him to ED for evaluation, no fever as per family.Suprapubic catheter was changed in ED.  Urine culture from 2 days ago showed ESBL UTI, sensitive to imipenem. ? ?Assessment & Plan: ?  ?Principal Problem: ?  UTI due to extended-spectrum beta lactamase (ESBL) producing Escherichia coli ?Active Problems: ?  History of ESBL E. coli infection ?  Hypokalemia ?  Dehydration ?  Pressure injury of skin ? ?ESBL UTI due to suprapubic catheter ?-Outpatient urine culture growing ESBL, continue with imipenem during hospital stay, can be switched to ertapenem daily upon discharge, will need 7 days treatment. ?-Suprapubic catheter was reinserted in ED. ? ?Acute metabolic encephalopathy ?-As discussed with the granddaughter, at baseline patient is awake alert and appropriate, coherent, no history of dementia. ?-Secondary to  ESBL UTI ?- family requested no Ativan for this patient as likelihood of precipitating further mentation changes.  Which is appropriate, if needed can use as needed Haldol ?-Delirium overnight, continue with as needed Haldol, he is appropriate and coherent today, if he continues to have delirium overnight then I will start him on Seroquel at bedtime. ?  ?Hypokalemia ?-repleted ?  ?Severe dehydration ?-From poor oral intake ?-continue with IV fluids ?  ?Malfunction of suprapubic urine catheter ?-Suprapubic catheter replaced in ED. ?  ?HTN ?-Stable, continue home meds. ?  ?PVD ?-Continue Plavix ?- S/P Right AKA ? ?Chronic combined systolic and diastolic CHF ?- S/P AICD  ?-Monitor closely for volume overload as on IV fluids ? ?History of CVA with residual dysarthria, dysphagia ?-Continue with Plavix ?-unable to take statin due to muscle pain as discussed with daughter ? ?SSS ?- S/P AICD ? ?Protien calorie malnutrition/hypophosphatemia ?-Is on Ensure and Prosource monitor closely for refeeding syndrome ?-Phosphorus level has been repleted. ? ?DVT prophylaxis: [place] lovenox ?Code Status: Full ?Family Communication: none at bedside, D/W granddaughter by phone. ?Disposition:  ? ?Status is: Inpatient ?Remains inpatient appropriate because: remains Altered on IV ABX(no oral option currently) ?  ?Consultants:  ?None ? ?Subjective: ? ?Patient with an episode of delirium overnight where he pulled his IV out, this morning he is much improved. ? ?Objective: ?Vitals:  ? 09/27/21 0927 09/27/21 2056 09/28/21 0529 09/28/21 1011  ?BP: (!) 127/52 127/71 127/62 116/81  ?Pulse: 60 86  79  ?Resp: '17 18 18 18  '$ ?Temp: (!) 97.4 ?F (36.3 ?C) 97.7 ?F (36.5 ?C)  (!) 97.5 ?F (36.4 ?C)  ?TempSrc: Axillary  Oral  Oral  ?SpO2: 99% 96%  98%  ? ? ?Intake/Output Summary (Last 24 hours) at 09/28/2021 1109 ?Last data filed at 09/28/2021 0900 ?Gross per 24 hour  ?Intake 2068.18 ml  ?Output 3200 ml  ?Net -1131.82 ml  ? ? ?There were no vitals filed for this  visit. ? ?Examination: ? ? ?Awake today, monitor interactive and conversant, with significant aphasia, thin appearing, cachectic, frail.   ?Symmetrical Chest wall movement, Good air movement bilaterally, CTAB ?RRR,No Gallops,Rubs or new Murmurs, No Parasternal Heave ?+ve B.Sounds, Abd Soft, No tenderness, No rebound - guarding or rigidity. ?No Cyanosis, Clubbing or edema, Right AKA ? ? ? ?Data Reviewed: I have personally reviewed following labs and imaging studies ? ?CBC: ?Recent Labs  ?Lab 09/24/21 ?1015 09/26/21 ?1100 09/28/21 ?0406  ?WBC 5.9 5.9 5.4  ?NEUTROABS  --  4.3  --   ?HGB 12.0* 14.0 11.6*  ?HCT 39.2 44.8 37.1*  ?MCV 89.5 89.8 89.4  ?PLT 194 183 156  ? ? ? ?Basic Metabolic Panel: ?Recent Labs  ?Lab 09/24/21 ?1015 09/26/21 ?1100 09/26/21 ?1858 09/28/21 ?0406  ?NA 144 142  --  139  ?K 3.2* 3.3*  --  3.6  ?CL 111 106  --  105  ?CO2 26 26  --  29  ?GLUCOSE 93 114*  --  106*  ?BUN 13 13  --  10  ?CREATININE 0.75 0.83  --  0.60*  ?CALCIUM 9.9 10.2  --  9.3  ?MG  --   --  2.6* 2.1  ?PHOS  --   --  2.6 2.2*  ? ? ? ?GFR: ?CrCl cannot be calculated (Unknown ideal weight.). ? ?Liver Function Tests: ?Recent Labs  ?Lab 09/26/21 ?1100  ?AST 17  ?ALT 10  ?ALKPHOS 133*  ?BILITOT 1.0  ?PROT 6.8  ?ALBUMIN 3.3*  ? ? ? ?CBG: ?No results for input(s): GLUCAP in the last 168 hours. ? ? ?Recent Results (from the past 240 hour(s))  ?Urine Culture     Status: Abnormal  ? Collection Time: 09/24/21 10:25 AM  ? Specimen: Urine, Clean Catch  ?Result Value Ref Range Status  ? Specimen Description URINE, CLEAN CATCH  Final  ? Special Requests   Final  ?  NONE ?Performed at [facility], [place] [address] [place], [place] [address] ?  ? Culture (A)  Final  ?  >=100,000 COLONIES/mL ESCHERICHIA COLI ?Confirmed Extended Spectrum Beta-Lactamase Producer (ESBL).  In bloodstream infections from ESBL organisms, carbapenems are preferred over piperacillin/tazobactam. They are shown to have a lower risk of mortality. ?  ? Report Status  09/26/2021 FINAL  Final  ? Organism ID, Bacteria ESCHERICHIA COLI (A)  Final  ?    Susceptibility  ? Escherichia coli - MIC*  ?  AMPICILLIN >=32 RESISTANT Resistant   ?  CEFAZOLIN >=64 RESISTANT Resistant   ?  CEFEPIME 16 RESISTANT Resistant   ?  CEFTRIAXONE >=64 RESISTANT Resistant   ?  CIPROFLOXACIN >=4 RESISTANT Resistant   ?  GENTAMICIN >=16 RESISTANT Resistant   ?  IMIPENEM <=0.25 SENSITIVE Sensitive   ?  NITROFURANTOIN <=16 SENSITIVE Sensitive   ?  TRIMETH/SULFA >=320 RESISTANT Resistant   ?  AMPICILLIN/SULBACTAM >=32 RESISTANT Resistant   ?  PIP/TAZO >=128 RESISTANT Resistant   ?  * >=100,000 COLONIES/mL ESCHERICHIA COLI  ? ?  ? ? ? ? ? ?Radiology Studies: ?DG Chest Portable 1 View ? ?Result Date: 09/26/2021 ?CLINICAL DATA:  Screening.  Suprapubic catheter fell out. EXAM: PORTABLE CHEST 1  VIEW COMPARISON:  AP chest 08/11/2021 FINDINGS: Status post median sternotomy. Left chest wall cardiac AICD with leads again overlying the right atrium and right ventricle. Cardiac silhouette is at the upper limits of normal size. Mediastinal contours are within normal limits. Mild-to-moderate calcification within the aortic arch. The lungs are clear. No pleural effusion or pneumothorax. Degenerative bridging osteophytes of the thoracic spine. IMPRESSION: No acute cardiopulmonary disease process. Electronically Signed   By: Yvonne Kendall M.D.   On: 09/26/2021 11:53   ? ? ? ? ? ?Scheduled Meds: ? acidophilus  2 capsule Oral Daily  ? amLODipine  10 mg Oral Daily  ? clopidogrel  75 mg Oral QHS  ? divalproex  125 mg Oral QHS  ? enoxaparin (LOVENOX) injection  40 mg Subcutaneous Q24H  ? feeding supplement  237 mL Oral TID BM  ? pantoprazole  40 mg Oral Daily  ? rosuvastatin  10 mg Oral q morning  ? vitamin B-12  1,000 mcg Oral Daily  ? ?Continuous Infusions: ? meropenem (MERREM) IV 1 g (09/28/21 0507)  ? potassium PHOSPHATE IVPB (in mmol) 30 mmol (09/28/21 0846)  ? ? ? LOS: 1 day  ? ? ? ? ? ? ?Phillips Climes, MD ?[facility] ? ? ?To contact the attending provider between 7A-7P or the covering provider during after hours 7P-7A, please log into the web site [email or web address] and access using universal [place] password for that we

## 2021-09-28 NOTE — Telephone Encounter (Signed)
Forwarding to [place] so she is aware.  ?

## 2021-09-28 NOTE — Evaluation (Signed)
Physical Therapy Evaluation ?Patient Details ?Name: Todd Dunn ?MRN: [id number] ?DOB: [birth year] ?Today's Date: 09/28/2021 ? ?History of Present Illness ? Todd Dunn is a 81 y.o. male with medical history significant of indwelling suprapubic urinary catheter secondary to severe BPH, CAD, PVD status post right AKA, SSS status post ICD, HTN, CVA with chronic aphasia, presented with worsening of suprapubic abdominal pain and mentation changes.  ?Clinical Impression ?  ?Pt admitted with above diagnosis. Comes from SNF, where he was undergoing rehab; Difficult to understand pt at times due to impaired communication, noted PLOF from chart review; Presents to PT with decr coordination, decr activity tolerance, and functional dependencies; Mod to heavy mod assist with bed mobility and simulated transfers; Noting effort to follow commands; REcommend post-acute rehab at SNF;  Pt currently with functional limitations due to the deficits listed below (see PT Problem List). Pt will benefit from skilled PT to increase their independence and safety with mobility to allow discharge to the venue listed below.   ?   ?   ? ?Recommendations for follow up therapy are one component of a multi-disciplinary discharge planning process, led by the attending physician.  Recommendations may be updated based on patient status, additional functional criteria and insurance authorization. ? ?Follow Up Recommendations Skilled nursing-short term rehab (<3 hours/day) ? ?  ?Assistance Recommended at Discharge Frequent or constant Supervision/Assistance  ?Patient can return home with the following ?   ? ?  ?Equipment Recommendations  (to be determined)  ?Recommendations for Other Services ?    ?  ?Functional Status Assessment Patient has had a recent decline in their functional status and demonstrates the ability to make significant improvements in function in a reasonable and predictable amount of time.  ? ?  ?Precautions / Restrictions  Precautions ?Precautions: Fall ?Precaution Comments: Restless and pulling on lines; check restraints ?Restrictions ?Weight Bearing Restrictions: No  ? ?  ? ?Mobility ? Bed Mobility ?Overal bed mobility: Needs Assistance ?Bed Mobility: Rolling, Sidelying to Sit, Sit to Supine ?Rolling: Min assist ?Sidelying to sit: Mod assist ?  ?Sit to supine: Min assist ?  ?General bed mobility comments: Pt with adequate trunk strength to initiate pulling to sit; Mod assist to help and to stabilize, as he tended to overshoot; no difficulty getting back to supine, min assist for optimal positioning in bed ?  ? ?Transfers ?Overall transfer level: Needs assistance ?Equipment used:  (bed pad) ?Transfers: Bed to chair/wheelchair/BSC (simulated) ?  ?  ?  ?Squat pivot transfers: Mod assist ?  ?  ?General transfer comment: Performed simulated squat pivot transfer at EOB ?  ? ?Ambulation/Gait ?  ?  ?  ?  ?  ?  ?  ?  ? ?Stairs ?  ?  ?  ?  ?  ? ?Wheelchair Mobility ?  ? ?Modified Rankin (Stroke Patients Only) ?  ? ?  ? ?Balance Overall balance assessment: Needs assistance ?  ?Sitting balance-Leahy Scale: Fair ?  ?  ?  ?  ?  ?  ?  ?  ?  ?  ?  ?  ?  ?  ?  ?  ?   ? ? ? ?Pertinent Vitals/Pain Pain Assessment ?Pain Assessment: Faces ?Faces Pain Scale: Hurts little more ?Pain Location: Difficult to discern; occasional grimace, but mostly noted grimacing after restraints were reapplied ?Pain Descriptors / Indicators: Grimacing ?Pain Intervention(s): Monitored during session  ? ? ?Home Living Family/patient expects to be discharged to:: Skilled nursing facility ?Living Arrangements: Other  relatives (granddaughter and great grandson) ?Available Help at Discharge: Family ?Type of Home: House ?Home Access: Stairs to enter ?  ?Entrance Stairs-Number of Steps: 2 ?  ?Home Layout: One level ?Home Equipment: Conservation officer, nature (2 wheels);Wheelchair - manual;Grab bars - tub/shower ?Additional Comments: information from previous admissions  ?  ?Prior Function  Prior Level of Function : Needs assist;Patient poor historian/Family not available ?  ?  ?  ?  ?  ?  ?Mobility Comments: reports uses wheelchair (manual); no longer walks; uses RW for transfer to wheelchair ?  ?  ? ? ?Hand Dominance  ? Dominant Hand: Right ? ?  ?Extremity/Trunk Assessment  ? Upper Extremity Assessment ?Upper Extremity Assessment: Difficult to assess due to impaired cognition (and restraints applied) ?  ? ?Lower Extremity Assessment ?Lower Extremity Assessment: Difficult to assess due to impaired cognition;RLE deficits/detail;LLE deficits/detail ?RLE Deficits / Details: Healed AKA ?LLE Deficits / Details: Noting voluntary movement throughout LLE with hip, knee, and ankle ROM WFL for sitting; Able to use LLE to bridge hips off of teh bed with multimodal cueing, and noted good use of LLE as a pivot point for simulated squat pivot transfer ?  ? ?Cervical / Trunk Assessment ?Cervical / Trunk Assessment: Other exceptions ?Cervical / Trunk Exceptions: suprapubic catheter  ?Communication  ? Communication: Expressive difficulties (wit history of aphasia)  ?Cognition Arousal/Alertness: Awake/alert ?Behavior During Therapy: Restless ?Overall Cognitive Status: No family/caregiver present to determine baseline cognitive functioning ?Area of Impairment: Following commands, Safety/judgement ?  ?  ?  ?  ?  ?  ?  ?  ?  ?  ?  ?Following Commands: Follows one step commands inconsistently, Follows one step commands with increased time ?Safety/Judgement: Decreased awareness of safety, Decreased awareness of deficits ?  ?  ?General Comments: Restless and trying to take mitts off; Able to make needs known (for instance bed positioning, wanting mitts off) ?  ?  ? ?  ?General Comments General comments (skin integrity, edema, etc.): Mitts on entire session; wrist restraints reapplied once back lying down; positioned HOB up, and assisted pt in drinking a boost ? ?  ?Exercises    ? ?Assessment/Plan  ?  ?PT Assessment Patient  needs continued PT services  ?PT Problem List Decreased strength;Decreased activity tolerance;Decreased balance;Decreased mobility;Decreased coordination;Decreased cognition;Decreased knowledge of use of DME;Decreased safety awareness ? ?   ?  ?PT Treatment Interventions DME instruction;Functional mobility training;Gait training;Therapeutic activities;Therapeutic exercise;Balance training;Neuromuscular re-education;Cognitive remediation;Wheelchair mobility training;Patient/family education   ? ?PT Goals (Current goals can be found in the Care Plan section)  ?Acute Rehab PT Goals ?Patient Stated Goal: unable to state; seemed hungry at end of session ?PT Goal Formulation: Patient unable to participate in goal setting ?Time For Goal Achievement: 10/12/21 ?Potential to Achieve Goals: Fair ? ?  ?Frequency Min 2X/week ?  ? ? ?Co-evaluation   ?  ?  ?  ?  ? ? ?  ?AM-PAC PT "6 Clicks" Mobility  ?Outcome Measure Help needed turning from your back to your side while in a flat bed without using bedrails?: A Little ?Help needed moving from lying on your back to sitting on the side of a flat bed without using bedrails?: A Lot ?Help needed moving to and from a bed to a chair (including a wheelchair)?: A Lot ?Help needed standing up from a chair using your arms (e.g., wheelchair or bedside chair)?: A Lot ?Help needed to walk in hospital room?: A Lot ?Help needed climbing 3-5 steps with a railing? : Total ?  6 Click Score: 12 ? ?  ?End of Session   ?Activity Tolerance: Patient tolerated treatment well ?Patient left: in bed;with call bell/phone within reach;with bed alarm set ?Nurse Communication: Mobility status;Other (comment) (pt may be hungry) ?PT Visit Diagnosis: Other abnormalities of gait and mobility (R26.89) ?  ? ?Time: 0086-7619 ?PT Time Calculation (min) (ACUTE ONLY): 25 min ? ? ?Charges:   PT Evaluation ?$PT Eval Moderate Complexity: 1 Mod ?PT Treatments ?$Therapeutic Activity: 8-22 mins ?  ?   ? ? ?Todd Dunn, PT   ?Acute Rehabilitation Services ?Office [phone] ? ? ?[place] ?09/28/2021, 4:10 PM ? ?

## 2021-09-28 NOTE — Consult Note (Addendum)
WOC Nurse Consult Note: ?Reason for Consult: Consult requested for sacrum. 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 (Change foam dressing Q 3 days or PRN soiling.) ?Please re-consult if further assistance is needed.  Thank-you,  ?Julien Girt MSN, RN, [place], [place], CNS ?[phone]  ?  ?

## 2021-09-28 NOTE — Telephone Encounter (Signed)
Mickel Baas is calling back- she has not gotten message about request for resuming care for patient for UTI. Per chart verbal order was given per Leamon Arnt ,CMA.  ?Mickel Baas states if chart states verbal order was given for continuation of care- she can accept. Advised I would let office know and if any other comments verified number still correct.([phone]) ?

## 2021-09-28 NOTE — Progress Notes (Signed)
TRH night cross cover note: ? ?I was notified by RN that the patient exhibiting evidence of agitation, to be to get out of bed, and attempting to pull out his peripheral IV, with these actions/behaviors refractory to attempts at verbal redirection. ? ?Per my chart review, including review of most recent rounding hospitalist progress note, I encountered recommendation for prn Haldol preferentially over Ativan for residual agitation.  On the basis of this documented recommendation, I placed order for prn IV Haldol.  ? ?However, for the patient could receive this prn IV Haldol, he was successful in removing his peripheral IV.  Subsequently, I placed order for soft bilateral wrist restraints, for rail, and changed Haldol ordered to reflect IM or IV route of administration, remaining on an as needed basis. ? ? ? ? ? ?Babs Bertin, DO ?Hospitalist ? ?

## 2021-09-28 NOTE — TOC Progression Note (Signed)
Transition of Care (TOC) - Initial/Assessment Note  ? ? ?Patient Details  ?Name: Todd Dunn ?MRN: [id number] ?Date of Birth: [birth year] ? ?Transition of Care (TOC) CM/SW Contact:    ?Paulene Floor Jiovany Scheffel, LCSWA ?Phone Number: ?09/28/2021, 12:40 PM ? ?Clinical Narrative:                 ?TOC received consult for SNF placement at discharge. ? ?PT/OT assessments and recommendations are still pending.   ? ?Lind Covert, MSW, LCSWA  ? ?  ?  ? ? ?Patient Goals and CMS Choice ?  ?  ?  ? ?Expected Discharge Plan and Services ?  ?  ?  ?  ?  ?                ?  ?  ?  ?  ?  ?  ?  ?  ?  ?  ? ?Prior Living Arrangements/Services ?  ?  ?  ?       ?  ?  ?  ?  ? ?Activities of Daily Living ?  ?  ? ?Permission Sought/Granted ?  ?  ?   ?   ?   ?   ? ?Emotional Assessment ?  ?  ?  ?  ?  ?  ? ?Admission diagnosis:  Suprapubic catheter ([place]) [Z93.59] ?ESBL (extended spectrum beta-lactamase) producing bacteria infection [A49.9, Z16.12] ?UTI due to extended-spectrum beta lactamase (ESBL) producing Escherichia coli [N39.0, B96.29, Z16.12] ?Patient Active Problem List  ? Diagnosis Date Noted  ? Pressure injury of skin 09/27/2021  ? UTI due to extended-spectrum beta lactamase (ESBL) producing Escherichia coli 09/26/2021  ? Hypokalemia 09/26/2021  ? Dehydration 09/26/2021  ? 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 CAP (community acquired pneumonia) 06/21/2021  ? Severe sepsis ([place]) 06/21/2021  ? SOB (shortness of breath) 06/21/2021  ? GERD (gastroesophageal reflux disease)   ? AMS (altered mental status) 05/11/2021  ? Hypotension 05/11/2021  ? Iron deficiency anemia 05/07/2021  ? Oral phase dysphagia 04/28/2021  ? Protein-calorie malnutrition, severe 03/27/2021  ? Toe infection   ? PVD (peripheral vascular disease) ([place])   ? Osteomyelitis of great toe of right foot ([place]) 03/20/2021  ? Wound infection 03/20/2021  ? Pacemaker 03/20/2021  ? Elevated alkaline phosphatase level 03/20/2021  ? Hyperlipidemia 03/20/2021   ? Hematuria 02/14/2021  ? Urinary tract infection associated with indwelling urethral catheter ([place])   ? Normocytic anemia 01/06/2021  ? Catheter-associated urinary tract infection ([place]) 12/15/2020  ? Toxic metabolic encephalopathy 78/29/5621  ? Chronic indwelling Foley catheter 12/15/2020  ? HTN (hypertension) 12/15/2020  ? History of ESBL E. coli infection 12/15/2020  ? BPH (benign prostatic hyperplasia) 12/12/2019  ? Chronic systolic heart failure ([place]) 12/12/2019  ? ?PCP:  [name], NP ?Pharmacy:   ?[facility] ?[place] [address] ?Phone: [phone] Fax: [phone] ? ?[facility] ?[address] ?[place] [address] ?Phone: [phone] Fax: [phone] ? ? ? ? ?Social Determinants of Health (SDOH) Interventions ?  ? ?Readmission Risk Interventions ? ?  08/04/2021  ?  9:19 AM 06/24/2021  ? 11:26 AM 02/17/2021  ?  9:12 AM  ?Readmission Risk Prevention Plan  ?Transportation Screening Complete Complete Complete  ?Medication Review Press photographer) Complete Complete Complete  ?PCP or Specialist appointment within 3-5 days of  discharge Complete Complete Complete  ?[place] or Home Care Consult Complete Complete Complete  ?SW Recovery Care/Counseling Consult Complete  Complete  ?Palliative Care Screening Not Applicable Not Applicable Not Applicable  ?Skilled Nursing Facility Complete Not Applicable Not Applicable  ? ? ? ?

## 2021-09-29 DIAGNOSIS — A499 Bacterial infection, unspecified: Secondary | ICD-10-CM | POA: Diagnosis not present

## 2021-09-29 DIAGNOSIS — B9629 Other Escherichia coli [E. coli] as the cause of diseases classified elsewhere: Secondary | ICD-10-CM | POA: Diagnosis not present

## 2021-09-29 DIAGNOSIS — N390 Urinary tract infection, site not specified: Secondary | ICD-10-CM | POA: Diagnosis not present

## 2021-09-29 DIAGNOSIS — Z1612 Extended spectrum beta lactamase (ESBL) resistance: Secondary | ICD-10-CM | POA: Diagnosis not present

## 2021-09-29 LAB — CBC
HCT: 39.2 % (ref 39.0–52.0)
Hemoglobin: 12.7 g/dL — ABNORMAL LOW (ref 13.0–17.0)
MCH: 28.1 pg (ref 26.0–34.0)
MCHC: 32.4 g/dL (ref 30.0–36.0)
MCV: 86.7 fL (ref 80.0–100.0)
Platelets: 181 10*3/uL (ref 150–400)
RBC: 4.52 MIL/uL (ref 4.22–5.81)
RDW: 16.5 % — ABNORMAL HIGH (ref 11.5–15.5)
WBC: 6.1 10*3/uL (ref 4.0–10.5)
nRBC: 0 % (ref 0.0–0.2)

## 2021-09-29 LAB — BASIC METABOLIC PANEL
Anion gap: 6 (ref 5–15)
BUN: 9 mg/dL (ref 8–23)
CO2: 25 mmol/L (ref 22–32)
Calcium: 9.6 mg/dL (ref 8.9–10.3)
Chloride: 105 mmol/L (ref 98–111)
Creatinine, Ser: 0.54 mg/dL — ABNORMAL LOW (ref 0.61–1.24)
GFR, Estimated: >60 mL/min (ref >60)
Glucose, Bld: 131 mg/dL — ABNORMAL HIGH (ref 70–99)
Potassium: 4.3 mmol/L (ref 3.5–5.1)
Sodium: 136 mmol/L (ref 135–145)

## 2021-09-29 LAB — PHOSPHORUS: Phosphorus: 2.6 mg/dL (ref 2.5–4.6)

## 2021-09-29 MED ORDER — QUETIAPINE FUMARATE 25 MG PO TABS
25.0000 mg | ORAL_TABLET | Freq: Every day | ORAL | Status: DC
  Administered 2021-09-29 – 2021-09-30 (×2): 25 mg via ORAL
  Filled 2021-09-29 (×2): qty 1

## 2021-09-29 NOTE — Progress Notes (Signed)
?PROGRESS NOTE ? ? ? ?Todd Dunn  WFU:[id number] DOB: 08/12/1940 DOA: 09/26/2021 ?PCP: [name], NP  ? ? ?Chief Complaint  ?Patient presents with  ? Catheter Problem  ? ? ?Brief Narrative:  ? ?  ?Todd L Enyeart is a 81 y.o. male with medical history significant of indwelling suprapubic urinary catheter secondary to severe BPH, CAD, PVD status post right AKA, SSS status post ICD, HTN, CVA with chronic aphasia, presented with worsening of suprapubic abdominal pain and mentation changes. ?  ?Patient has chronic aphasia which without restriction from the stroke, or history provided patient's granddaughter over the phone.  Granddaughter reported that the patient has been complaining about suprapubic abdominal pain about 1 week ago, frequent nauseous but no vomiting and decreased oral intake.  She brought him to PCP 3 days ago, and work-up showed patient had a UTI and patient was started on Keflex.  Despite taking antibiotics for last 2 days, patient continued to experience worsening of suprapubic pain and worsening of confusion and agitation, patient pulled out suprapubic catheter.  So she brought him to ED for evaluation, no fever as per family.Suprapubic catheter was changed in ED.  Urine culture from 2 days ago showed ESBL UTI, sensitive to imipenem. ? ?Assessment & Plan: ?  ?Principal Problem: ?  UTI due to extended-spectrum beta lactamase (ESBL) producing Escherichia coli ?Active Problems: ?  History of ESBL E. coli infection ?  Hypokalemia ?  Dehydration ?  Pressure injury of skin ? ?ESBL UTI due to suprapubic catheter ?-Outpatient urine culture growing ESBL, continue with imipenem during hospital stay, can be switched to ertapenem daily upon discharge, will need 7 days treatment. ?-Suprapubic catheter was reinserted in ED. ? ?Acute metabolic encephalopathy ?-As discussed with the granddaughter, at baseline patient is awake alert and appropriate, coherent, no history of dementia. ?-Secondary to  ESBL UTI ?- family requested no Ativan for this patient as likelihood of precipitating further mentation changes.  Which is appropriate, if needed can use as needed Haldol ?-His delirium is mainly overnight, so I will add Seroquel at bedtime, meanwhile continue with as needed Haldol. ?-Mentation gradually improving, still mildly confused, he is with significant aphasia which sometimes can be mistakenly taken as confusion. ?  ?Hypokalemia ?-repleted ?  ?Severe dehydration ?-From poor oral intake ?-continue with IV fluids ?  ?Malfunction of suprapubic urine catheter ?-Suprapubic catheter replaced in ED. ?  ?HTN ?-Stable, continue home meds. ?  ?PVD ?-Continue Plavix ?- S/P Right AKA ? ?Chronic combined systolic and diastolic CHF ?- S/P AICD  ?-Monitor closely for volume overload as on IV fluids ? ?History of CVA with residual dysarthria, dysphagia ?-Continue with Plavix ?-unable to take statin due to muscle pain as discussed with daughter ? ?SSS ?- S/P AICD ? ?Protien calorie malnutrition/hypophosphatemia ?-Is on Ensure and Prosource monitor closely for refeeding syndrome ?-Phosphorus level has been repleted. ? ?DVT prophylaxis: [place] lovenox ?Code Status: Full ?Family Communication: none at bedside, D/W granddaughter by phone 4/23, 4/25 ?Disposition: Will need SNF placement ? ?Status is: Inpatient ?Remains inpatient appropriate because: remains Altered on IV ABX(no oral option currently) ?  ?Consultants:  ?None ? ?Subjective: ? ?Is with another episode of confusion/delirium overnight where he required to go back on restraints. ? ?Objective: ?Vitals:  ? 09/28/21 1011 09/28/21 1753 09/28/21 2215 09/29/21 0805  ?BP: 116/81 130/81 136/88 131/83  ?Pulse: 79 86 89 87  ?Resp: '18 19 20 18  '$ ?Temp: (!) 97.5 ?F (36.4 ?C) 98.5 ?F (36.9 ?C) 97.6 ?  F (36.4 ?C) 97.8 ?F (36.6 ?C)  ?TempSrc: Oral Oral Axillary Axillary  ?SpO2: 98% 100% 100% 100%  ? ? ?Intake/Output Summary (Last 24 hours) at 09/29/2021 1601 ?Last data filed at 09/29/2021  1232 ?Gross per 24 hour  ?Intake 731.06 ml  ?Output 3150 ml  ?Net -2418.94 ml  ? ? ?There were no vitals filed for this visit. ? ?Examination: ? ? ?He is more awake, and appropriate today, remains confused, but he is more interactive and appropriate, he is with significant aphasia.  Thin appearing. ?Symmetrical Chest wall movement, Good air movement bilaterally, CTAB ?RRR,No Gallops,Rubs or new Murmurs, No Parasternal Heave ?+ve B.Sounds, Abd Soft, No tenderness, suprapubic Foley. ?No Cyanosis, Clubbing or edema, Right AKA ? ? ? ?Data Reviewed: I have personally reviewed following labs and imaging studies ? ?CBC: ?Recent Labs  ?Lab 09/24/21 ?1015 09/26/21 ?1100 09/28/21 ?0406 09/29/21 ?0156  ?WBC 5.9 5.9 5.4 6.1  ?NEUTROABS  --  4.3  --   --   ?HGB 12.0* 14.0 11.6* 12.7*  ?HCT 39.2 44.8 37.1* 39.2  ?MCV 89.5 89.8 89.4 86.7  ?PLT 194 183 156 181  ? ? ? ?Basic Metabolic Panel: ?Recent Labs  ?Lab 09/24/21 ?1015 09/26/21 ?1100 09/26/21 ?1858 09/28/21 ?0406 09/29/21 ?0156  ?NA 144 142  --  139 136  ?K 3.2* 3.3*  --  3.6 4.3  ?CL 111 106  --  105 105  ?CO2 26 26  --  29 25  ?GLUCOSE 93 114*  --  106* 131*  ?BUN 13 13  --  10 9  ?CREATININE 0.75 0.83  --  0.60* 0.54*  ?CALCIUM 9.9 10.2  --  9.3 9.6  ?MG  --   --  2.6* 2.1  --   ?PHOS  --   --  2.6 2.2* 2.6  ? ? ? ?GFR: ?CrCl cannot be calculated (Unknown ideal weight.). ? ?Liver Function Tests: ?Recent Labs  ?Lab 09/26/21 ?1100  ?AST 17  ?ALT 10  ?ALKPHOS 133*  ?BILITOT 1.0  ?PROT 6.8  ?ALBUMIN 3.3*  ? ? ? ?CBG: ?No results for input(s): GLUCAP in the last 168 hours. ? ? ?Recent Results (from the past 240 hour(s))  ?Urine Culture     Status: Abnormal  ? Collection Time: 09/24/21 10:25 AM  ? Specimen: Urine, Clean Catch  ?Result Value Ref Range Status  ? Specimen Description URINE, CLEAN CATCH  Final  ? Special Requests   Final  ?  NONE ?Performed at [facility], [place] [address] [place], [place] [address] ?  ? Culture (A)  Final  ?  >=100,000 COLONIES/mL ESCHERICHIA  COLI ?Confirmed Extended Spectrum Beta-Lactamase Producer (ESBL).  In bloodstream infections from ESBL organisms, carbapenems are preferred over piperacillin/tazobactam. They are shown to have a lower risk of mortality. ?  ? Report Status 09/26/2021 FINAL  Final  ? Organism ID, Bacteria ESCHERICHIA COLI (A)  Final  ?    Susceptibility  ? Escherichia coli - MIC*  ?  AMPICILLIN >=32 RESISTANT Resistant   ?  CEFAZOLIN >=64 RESISTANT Resistant   ?  CEFEPIME 16 RESISTANT Resistant   ?  CEFTRIAXONE >=64 RESISTANT Resistant   ?  CIPROFLOXACIN >=4 RESISTANT Resistant   ?  GENTAMICIN >=16 RESISTANT Resistant   ?  IMIPENEM <=0.25 SENSITIVE Sensitive   ?  NITROFURANTOIN <=16 SENSITIVE Sensitive   ?  TRIMETH/SULFA >=320 RESISTANT Resistant   ?  AMPICILLIN/SULBACTAM >=32 RESISTANT Resistant   ?  PIP/TAZO >=128 RESISTANT Resistant   ?  * >=  100,000 COLONIES/mL ESCHERICHIA COLI  ? ?  ? ? ? ? ? ?Radiology Studies: ?No results found. ? ? ? ? ? ?Scheduled Meds: ? acidophilus  2 capsule Oral Daily  ? amLODipine  10 mg Oral Daily  ? clopidogrel  75 mg Oral QHS  ? divalproex  125 mg Oral QHS  ? enoxaparin (LOVENOX) injection  40 mg Subcutaneous Q24H  ? feeding supplement  237 mL Oral TID BM  ? pantoprazole  40 mg Oral Daily  ? QUEtiapine  25 mg Oral QHS  ? vitamin B-12  1,000 mcg Oral Daily  ? ?Continuous Infusions: ? sodium chloride 75 mL/hr at 09/29/21 0528  ? meropenem (MERREM) IV 1 g (09/29/21 1404)  ? ? ? LOS: 2 days  ? ? ? ? ? ? ?Phillips Climes, MD ?[facility] ? ? ?To contact the attending provider between 7A-7P or the covering provider during after hours 7P-7A, please log into the web site [email or web address] and access using universal [place] password for that web site. If you do not have the password, please call the hospital operator. ? ?09/29/2021, 4:01 PM  ? ? ?

## 2021-09-29 NOTE — Evaluation (Signed)
Occupational Therapy Evaluation ?Patient Details ?Name: Todd Dunn ?MRN: [id number] ?DOB: 12-27-1940 ?Today's Date: 09/29/2021 ? ? ?History of Present Illness Todd Dunn is a 81 y.o. male with medical history significant of indwelling suprapubic urinary catheter secondary to severe BPH, CAD, PVD status post right AKA, SSS status post ICD, HTN, CVA with chronic aphasia, presented with worsening of suprapubic abdominal pain and mentation changes.  ? ?Clinical Impression ?  ?Unsure of Pt's PLOF as Pt with chronic aphasia and unable to discern answers to questions - he does attempt to communicate. Today Pt was overall min A for bed mobility - to come to long sitting, then come sit EOB. Multiple attempts to engage Pt in grooming tasks, eating, functional ADL and Pt not interested, not engaging even with hand over hand initiation. Pt then returned supine, and total A for peri care at bed level to clean up from BM. Pt with mitts off during session and not actively pulling at lines. Mitts placed back on at the end of session. He was no longer in 4 pt restraints when OT entered. OT will continue to follow acutely, recommend return to SNF post-acute  ?   ? ?Recommendations for follow up therapy are one component of a multi-disciplinary discharge planning process, led by the attending physician.  Recommendations may be updated based on patient status, additional functional criteria and insurance authorization.  ? ?Follow Up Recommendations ? Skilled nursing-short term rehab (<3 hours/day)  ?  ?Assistance Recommended at Discharge  (return to SNF)  ?Patient can return home with the following A lot of help with walking and/or transfers;A lot of help with bathing/dressing/bathroom;Direct supervision/assist for medications management;Direct supervision/assist for financial management;Assist for transportation;Help with stairs or ramp for entrance ? ?  ?Functional Status Assessment ? Patient has had a recent decline in  their functional status and demonstrates the ability to make significant improvements in function in a reasonable and predictable amount of time.  ?Equipment Recommendations ? Other (comment) (defer to SNF)  ?  ?Recommendations for Other Services Speech consult ? ? ?  ?Precautions / Restrictions Precautions ?Precautions: Fall ?Precaution Comments: Restless and pulling on lines; check restraints ?Restrictions ?Weight Bearing Restrictions: No  ? ?  ? ?Mobility Bed Mobility ?Overal bed mobility: Needs Assistance ?Bed Mobility: Rolling, Sidelying to Sit, Sit to Supine ?Rolling: Min assist ?Sidelying to sit: Min assist ?  ?Sit to supine: Min assist ?  ?General bed mobility comments: Pt came to long sitting independently in the bed. He is very strong - due to impaired communication it is hard to know what he is trying to do when he initiates activity sometimes. Pt with adequate trunk strength to initiate pulling to sit; he tends to overshoot and movements are not smooth or coordinated but given time he is strong and capable; no difficulty getting back to supine. ?  ? ?Transfers ?Overall transfer level: Needs assistance ?Equipment used:  (bed pad, mitts) ?  ?  ?  ?  ?  ?  ?  ?General transfer comment: Performed simulated squat pivot transfer at EOB ?  ? ?  ?Balance Overall balance assessment: Needs assistance ?  ?Sitting balance-Leahy Scale: Fair ?  ?  ?  ?  ?  ?  ?  ?  ?  ?  ?  ?  ?  ?  ?  ?  ?   ? ?ADL either performed or assessed with clinical judgement  ? ?ADL Overall ADL's : Needs assistance/impaired ?Eating/Feeding: Moderate assistance ?  ?  Grooming: Moderate assistance ?  ?Upper Body Bathing: Maximal assistance ?  ?Lower Body Bathing: Maximal assistance ?  ?Upper Body Dressing : Moderate assistance ?  ?Lower Body Dressing: Maximal assistance ?  ?Toilet Transfer: Moderate assistance ?  ?Toileting- Clothing Manipulation and Hygiene: Total assistance;Bed level ?Toileting - Clothing Manipulation Details (indicate cue  type and reason): found with bowel movement in bed, cleaned and new linens ?  ?  ?Functional mobility during ADLs:  (NT today) ?General ADL Comments: Pt attempting to communicate, movements big and uncoordinated, baseline chronic aphasia, Pt not following commands today at all, and did not participate in any activities offered to him  ? ? ? ?Vision   ?   ?   ?Perception   ?  ?Praxis   ?  ? ?Pertinent Vitals/Pain Pain Assessment ?Pain Assessment: Faces ?Faces Pain Scale: Hurts little more ?Pain Location: Difficult to discern; occasional grimace, but mostly noted grimacing after restraints were reapplied ?Pain Descriptors / Indicators: Grimacing ?Pain Intervention(s): Monitored during session  ? ? ? ?Hand Dominance Right ?  ?Extremity/Trunk Assessment Upper Extremity Assessment ?Upper Extremity Assessment: Overall WFL for tasks assessed;Difficult to assess due to impaired cognition ?  ?Lower Extremity Assessment ?Lower Extremity Assessment: Defer to PT evaluation ?  ?Cervical / Trunk Assessment ?Cervical / Trunk Assessment: Other exceptions ?Cervical / Trunk Exceptions: suprapubic catheter ?  ?Communication Communication ?Communication: Expressive difficulties (with history of aphasia) ?  ?Cognition Arousal/Alertness: Awake/alert ?Behavior During Therapy: Restless ?Overall Cognitive Status: No family/caregiver present to determine baseline cognitive functioning ?Area of Impairment: Following commands, Safety/judgement ?  ?  ?  ?  ?  ?  ?  ?  ?  ?  ?  ?Following Commands: Follows one step commands inconsistently, Follows one step commands with increased time ?Safety/Judgement: Decreased awareness of safety, Decreased awareness of deficits ?  ?  ?General Comments: Restless and trying to take mitts off; Able to make needs known (for instance bed positioning, wanting mitts off) ?  ?  ?General Comments    ? ?  ?Exercises   ?  ?Shoulder Instructions    ? ? ?Home Living Family/patient expects to be discharged to:: Skilled  nursing facility ?Living Arrangements: Other relatives (granddaughter and great grandson) ?Available Help at Discharge: Family ?Type of Home: House ?Home Access: Stairs to enter ?Entrance Stairs-Number of Steps: 2 ?  ?Home Layout: One level ?  ?  ?Bathroom Shower/Tub: Tub/shower unit;Curtain;Walk-in shower ?  ?Bathroom Toilet: Standard ?  ?  ?Home Equipment: Conservation officer, nature (2 wheels);Wheelchair - manual;Grab bars - tub/shower ?  ?Additional Comments: information from previous admissions ?  ? ?  ?Prior Functioning/Environment Prior Level of Function : Needs assist;Patient poor historian/Family not available ?  ?  ?  ?  ?  ?  ?Mobility Comments: reports uses wheelchair (manual); no longer walks; uses RW for transfer to wheelchair ?  ?  ? ?  ?  ?OT Problem List: Decreased activity tolerance;Impaired balance (sitting and/or standing);Decreased coordination ?  ?   ?OT Treatment/Interventions: Self-care/ADL training;DME and/or AE instruction;Therapeutic activities;Cognitive remediation/compensation;Patient/family education;Balance training  ?  ?OT Goals(Current goals can be found in the care plan section) Acute Rehab OT Goals ?Patient Stated Goal: none stated ?OT Goal Formulation: Patient unable to participate in goal setting ?Time For Goal Achievement: 10/13/21 ?Potential to Achieve Goals: Fair ?ADL Goals ?Pt Will Perform Eating: with supervision;sitting ?Pt Will Perform Grooming: with set-up;sitting ?Pt Will Transfer to Toilet: with min assist;squat pivot transfer;bedside commode ?Pt Will Perform Toileting - Clothing Manipulation  and hygiene: with min guard assist;sitting/lateral leans ?Additional ADL Goal #1: Pt will follow commands 75% of the time for ADL completion  ?OT Frequency: Min 2X/week ?  ? ?Co-evaluation   ?  ?  ?  ?  ? ?  ?AM-PAC OT "6 Clicks" Daily Activity     ?Outcome Measure Help from another person eating meals?: A Lot ?Help from another person taking care of personal grooming?: A Lot ?Help from another  person toileting, which includes using toliet, bedpan, or urinal?: A Lot ?Help from another person bathing (including washing, rinsing, drying)?: A Lot ?Help from another person to put on and taking off r

## 2021-09-29 NOTE — Telephone Encounter (Signed)
Is it ok do verbal orders for this or do  you want him to come or do a virtual appt? ?

## 2021-09-29 NOTE — Progress Notes (Signed)
[facility] night cross cover note: ? ?I was notified by RN that patient remains agitated, pulling pulling at his peripheral IV, with these behaviors refractory to attempts at verbal redirection.  In the setting of associated interference with ongoing medical treatment posing potential harm to himself, I have renewed orders for soft bilateral wrist restraints plus side rails x 4. ? ? ? ?Babs Bertin, DO ?Hospitalist ? ?

## 2021-09-29 NOTE — Telephone Encounter (Signed)
Called to give verbal orders and Todd Dunn let me know pt is in the hospital  ?

## 2021-09-29 NOTE — Telephone Encounter (Signed)
Verbal orders. Sure! ?

## 2021-09-30 DIAGNOSIS — N390 Urinary tract infection, site not specified: Secondary | ICD-10-CM | POA: Diagnosis not present

## 2021-09-30 DIAGNOSIS — B9629 Other Escherichia coli [E. coli] as the cause of diseases classified elsewhere: Secondary | ICD-10-CM | POA: Diagnosis not present

## 2021-09-30 DIAGNOSIS — Z1612 Extended spectrum beta lactamase (ESBL) resistance: Secondary | ICD-10-CM | POA: Diagnosis not present

## 2021-09-30 LAB — PHOSPHORUS: Phosphorus: 2.4 mg/dL — ABNORMAL LOW (ref 2.5–4.6)

## 2021-09-30 NOTE — Progress Notes (Signed)
?PROGRESS NOTE ? ? ? ?Todd Dunn  LFY:[id number] DOB: August 31, 1940 DOA: 09/26/2021 ?PCP: [name], NP  ? ? ?Brief Narrative:  ?Todd Dunn is a 81 y.o. male with medical history significant of indwelling suprapubic urinary catheter secondary to severe BPH, CAD, PVD status post right AKA, SSS status post ICD, HTN, CVA with chronic aphasia, presented with worsening of suprapubic abdominal pain and mentation changes.  ? ?Assessment & Plan: ?  ?Principal Problem: ?  UTI due to extended-spectrum beta lactamase (ESBL) producing Escherichia coli ?Active Problems: ?  History of ESBL E. coli infection ?  Hypokalemia ?  Dehydration ?  Pressure injury of skin ? ?ESBL UTI due to suprapubic catheter ?-Outpatient urine culture growing ESBL.  Patient currently on meropenem.  Plan for a 7-day treatment.  Patient was started on meropenem on 4/22. We will plan to give treatment till 4/28.  ?-Suprapubic catheter was reinserted in ED. ? ?Acute metabolic encephalopathy ?Apparently according to granddaughter at baseline patient is awake alert appropriate.  No history of dementia. ?His alteration in mental status is thought to be secondary to UTI. ?Avoid benzodiazepines.  Can use Haldol as needed for agitation.  He was started on Seroquel for agitation. ?Mentation has been gradually improving but he still confused. He is with significant aphasia which sometimes can be mistakenly taken as confusion. ?  ?Hypokalemia ?Treated ?  ?Severe dehydration ?Was given IV fluids.  Can cut back on the rate. ?  ?Malfunction of suprapubic urine catheter ?Suprapubic catheter replaced in ED. ?  ?Essential hypertension ?Blood pressure is reasonably well controlled.  Continue to monitor.  Noted to be on amlodipine ?  ?PAD ?-Continue Plavix ?- S/P Right AKA ? ?Chronic combined systolic and diastolic CHF ?- S/P AICD. Cut back on IV fluids. ?Echocardiogram from October 2022 showed EF of 30 to 35% with grade 1 diastolic dysfunction. ? ?History  of CVA with residual dysarthria, dysphagia ?-Continue with Plavix ?-unable to take statin due to muscle pain as discussed with daughter ? ?SSS ?- S/P AICD ? ?Protien calorie malnutrition/hypophosphatemia ?Phosphorus repleted.  Encourage oral intake. ? ?DVT prophylaxis: [place] lovenox ?Code Status: Full ?Family Communication: No family at bedside. ?Disposition: Will need SNF placement ? ? ?Status is: Inpatient ?Remains inpatient appropriate because: remains Altered on IV ABX(no oral option currently) ?  ?Consultants:  ?None ? ?Subjective: ? ?Not answering questions.  Pointing to the sink but unable to tell me what he exactly wants. ? ? ?Objective: ?Vitals:  ? 09/29/21 0805 09/29/21 2038 09/30/21 0531 09/30/21 0900  ?BP: 131/83 (!) 136/92 (!) 91/19 128/80  ?Pulse: 87 92 77 80  ?Resp: '18 18 18   '$ ?Temp: 97.8 ?F (36.6 ?C) 98 ?F (36.7 ?C) (!) 97.5 ?F (36.4 ?C) 98 ?F (36.7 ?C)  ?TempSrc: Axillary Oral Oral Axillary  ?SpO2: 100% (!) 87% 99% 98%  ? ? ?Intake/Output Summary (Last 24 hours) at 09/30/2021 1045 ?Last data filed at 09/30/2021 0900 ?Gross per 24 hour  ?Intake 360 ml  ?Output 1550 ml  ?Net -1190 ml  ? ? ?There were no vitals filed for this visit. ? ? ?Examination: ? ?General appearance: Awake alert.  But is noted to be distracted ?Resp: Clear to auscultation bilaterally.  Normal effort ?Cardio: S1-S2 is normal regular.  No S3-S4.  No rubs murmurs or bruit ?GI: Abdomen is soft.  Nontender nondistended.  Bowel sounds are present normal.  No masses organomegaly ?Suprapubic catheter is noted. ?Extremities: No edema.  Moving his extremities ?Neurologic: No focal  neurological deficits.  ? ? ? ? ?Data Reviewed: I have personally reviewed following labs and imaging studies ? ?CBC: ?Recent Labs  ?Lab 09/24/21 ?1015 09/26/21 ?1100 09/28/21 ?0406 09/29/21 ?0156  ?WBC 5.9 5.9 5.4 6.1  ?NEUTROABS  --  4.3  --   --   ?HGB 12.0* 14.0 11.6* 12.7*  ?HCT 39.2 44.8 37.1* 39.2  ?MCV 89.5 89.8 89.4 86.7  ?PLT 194 183 156 181  ? ? ? ?Basic  Metabolic Panel: ?Recent Labs  ?Lab 09/24/21 ?1015 09/26/21 ?1100 09/26/21 ?1858 09/28/21 ?0406 09/29/21 ?3419 09/30/21 ?6222  ?NA 144 142  --  139 136  --   ?K 3.2* 3.3*  --  3.6 4.3  --   ?CL 111 106  --  105 105  --   ?CO2 26 26  --  29 25  --   ?GLUCOSE 93 114*  --  106* 131*  --   ?BUN 13 13  --  10 9  --   ?CREATININE 0.75 0.83  --  0.60* 0.54*  --   ?CALCIUM 9.9 10.2  --  9.3 9.6  --   ?MG  --   --  2.6* 2.1  --   --   ?PHOS  --   --  2.6 2.2* 2.6 2.4*  ? ? ? ?GFR: ?CrCl cannot be calculated (Unknown ideal weight.). ? ?Liver Function Tests: ?Recent Labs  ?Lab 09/26/21 ?1100  ?AST 17  ?ALT 10  ?ALKPHOS 133*  ?BILITOT 1.0  ?PROT 6.8  ?ALBUMIN 3.3*  ? ? ? ?CBG: ?No results for input(s): GLUCAP in the last 168 hours. ? ? ?Recent Results (from the past 240 hour(s))  ?Urine Culture     Status: Abnormal  ? Collection Time: 09/24/21 10:25 AM  ? Specimen: Urine, Clean Catch  ?Result Value Ref Range Status  ? Specimen Description URINE, CLEAN CATCH  Final  ? Special Requests   Final  ?  NONE ?Performed at [facility], [place] [address], [place], [place] [address] ?  ? Culture (A)  Final  ?  >=100,000 COLONIES/mL ESCHERICHIA COLI ?Confirmed Extended Spectrum Beta-Lactamase Producer (ESBL).  In bloodstream infections from ESBL organisms, carbapenems are preferred over piperacillin/tazobactam. They are shown to have a lower risk of mortality. ?  ? Report Status 09/26/2021 FINAL  Final  ? Organism ID, Bacteria ESCHERICHIA COLI (A)  Final  ?    Susceptibility  ? Escherichia coli - MIC*  ?  AMPICILLIN >=32 RESISTANT Resistant   ?  CEFAZOLIN >=64 RESISTANT Resistant   ?  CEFEPIME 16 RESISTANT Resistant   ?  CEFTRIAXONE >=64 RESISTANT Resistant   ?  CIPROFLOXACIN >=4 RESISTANT Resistant   ?  GENTAMICIN >=16 RESISTANT Resistant   ?  IMIPENEM <=0.25 SENSITIVE Sensitive   ?  NITROFURANTOIN <=16 SENSITIVE Sensitive   ?  TRIMETH/SULFA >=320 RESISTANT Resistant   ?  AMPICILLIN/SULBACTAM >=32 RESISTANT Resistant   ?  PIP/TAZO  >=128 RESISTANT Resistant   ?  * >=100,000 COLONIES/mL ESCHERICHIA COLI  ? ?  ? ? ? ? ? ?Radiology Studies: ?No results found. ? ? ?Scheduled Meds: ? acidophilus  2 capsule Oral Daily  ? amLODipine  10 mg Oral Daily  ? clopidogrel  75 mg Oral QHS  ? divalproex  125 mg Oral QHS  ? enoxaparin (LOVENOX) injection  40 mg Subcutaneous Q24H  ? feeding supplement  237 mL Oral TID BM  ? pantoprazole  40 mg Oral Daily  ? QUEtiapine  25 mg  Oral QHS  ? vitamin B-12  1,000 mcg Oral Daily  ? ?Continuous Infusions: ? sodium chloride 75 mL/hr at 09/29/21 0528  ? meropenem (MERREM) IV 1 g (09/30/21 6067)  ? ? ? LOS: 3 days  ? ? ?Bonnielee Haff, MD ?[facility] ? ? ?09/30/2021, 10:45 AM  ? ? ?

## 2021-09-30 NOTE — Progress Notes (Signed)
?   09/30/21 0531  ?Assess: MEWS Score  ?Temp (!) 97.5 ?F (36.4 ?C)  ?BP (!) 91/19  ?Pulse Rate 77  ?Resp 18  ?SpO2 99 %  ?O2 Device Room Air  ?Assess: MEWS Score  ?MEWS Temp 0  ?MEWS Systolic 1  ?MEWS Pulse 0  ?MEWS RR 0  ?MEWS LOC 1  ?MEWS Score 2  ?MEWS Score Color Yellow  ?Assess: if the MEWS score is Yellow or Red  ?Were vital signs taken at a resting state? Yes  ?Focused Assessment No change from prior assessment  ?Early Detection of Sepsis Score *See Row Information* Low  ?MEWS guidelines implemented *See Row Information* No, other (Comment)  ?Treat  ?MEWS Interventions Administered scheduled meds/treatments  ?Pain Scale 0-10  ?Pain Score Asleep  ?Faces Pain Scale 0  ?Notify: Charge Nurse/RN  ?Name of Charge Nurse/RN Notified Jimmie Molly RN  ?Date Charge Nurse/RN Notified 09/30/21  ?Time Charge Nurse/RN Notified 0630  ?Document  ?Patient Outcome Not stable and remains on department  ? ? ?

## 2021-09-30 NOTE — TOC Initial Note (Signed)
Transition of Care (TOC) - Initial/Assessment Note  ? ? ?Patient Details  ?Name: Todd Dunn ?MRN: [id number] ?Date of Birth: 04-Dec-1940 ? ?Transition of Care (TOC) CM/SW Contact:    ?Paulene Floor Wilhel[place]ia Addis, LCSWA ?Phone Number: ?09/30/2021, 4:34 PM ? ?Clinical Narrative:                 ?CSW received consult for possible SNF placement at time of discharge. CSW spoke with patient's granddaughter due to the patient being disoriented. Patient's grand daughter expressed understanding of PT recommendation and is agreeable to SNF placement at time of discharge. Patient's family reports preference for a facility in [place]. CSW discussed insurance authorization process and will provide Medicare SNF ratings list. Patient has received 3 COVID vaccines. CSW will send out referrals for review. No further questions reported at this time.  ? ?Skilled Nursing Rehab Facilities-   [email or web address]   Ratings out of 5 possible   ?Name Address  Phone # Quality Care Staffing Health Inspection Overall  ?[facility] [address] [place] '4 5 2 3  '$ ?[facility]  [address], [place] [phone] '3 2 5 5  '$ ?[facility] [place] '3 1 1 1  '$ ?[facility] [address] [place] '3 2 4 4  '$ ?[facility] [address], [place] '1 1 2 1  '$ ?[place] '2 1 4 3  '$ ?[facility] [address], [place] '5 2 3 4  '$ ?[facility] [address], [place] '5 2 2 3  '$ ?[address] ([place]) [address] [place] [phone] '5 1 2 2  '$ ?[facility] [address], [place] [phone] '4 1 2 1  '$ ?[facility] [address] [place] [phone] '4 1 2 1  '$ ?[facility] [place] [phone] '4 1 1 1  '$ ?[facility] [address] [place] [phone] '3 2 4 4  '$ ?        ?[facility] [address] [place]      ?[facility] [place] '4 2 3 3  '$ ?[facility] [place], [place] '4 1 5 4  '$ ?[address] [place] [address], [place] [phone] '2 1 1 1  '$ ?[facility] [address] [place] [phone] '2 1 3 2  '$ ?        ?[address] (no [facility]) [address], [place] [phone] '4 5 5 5  '$ ?[facility] (No Hum[employer]) [address], [place] '3 1 4 3  '$ ?[facility] (No UHC) [address], [place] [phone] '5 5 5 5  '$ ?[facility] [address] [employer] [phone] '3 2 4 4  '$ ?[facility] [address] [place] '1 1 2 1  '$ ?[facility] [address] [place] [phone] '2 1 1 1  '$ ?[facility] [place] '5 2 4 5  '$ ?[facility] [address], [place] '3 1 1 1  '$ ?[facility] [place], [place] '2 1 2 1  '$ ?        ?[facility] [address] [place] [phone] '1 1 1 1  '$ ?[facility] [address], [place]  [phone] '2 4 2 2  '$ ?[facility] [address], [place] [phone] '5 2 3 4  '$ ?[place]  [address] [place] [phone] '2 1 1 1  '$ ?[facility] (No Humana) [address], [place] '2 1 3 2  '$ ?[facility] [address], [place] [phone] '3 1 1 1  '$ ?        ?[facility] [place], [place] '5 4 5 5  '$ ?[facility])  [address], [place] '2 2 3 3  '$ ?[facility]) [place], [place] '3 2 4 4  '$ ?[place] [address] [place] '4 3 4 4  '$ ?[address] [place] '3 3 1 1  '$ ?[facility]) [address] [place] [phone] '2 2 4 4   '$ ?  ? ?Expected Discharge Plan: [place] ?Barriers to Discharge: Continued Medical Work up, Ship broker, SNF Pending bed offer ? ? ?Patient Goals and CMS Choice ?  ?CMS Medicare.gov Compare Post Acute Care list provided to:: Patient Represenative (must comment) ?Choice offered to / list presented to : Adult Children ? ?Expected Discharge Plan and Services ?Expected Discharge Plan: [place] ?  ?  ?  ?Living arrangements for the past 2 months: [place] ?                ?  ?  ?  ?  ?  ?  ?  ?  ?  ?  ? ?Prior Living Arrangements/Services ?Living arrangements for the past 2 months: [place] ?Lives with:: Self, Relatives ?Patient language and need for interpreter reviewed:: Yes ?Do you feel safe going back to the place where you live?: Yes      ?Need for Family Participation in Patient Care: No (Comment) ?Care giver support system in place?: Yes (comment) ?  ?Criminal Activity/Legal Involvement Pertinent to Current Situation/Hospitalization: No - Comment as needed ? ?Activities of Daily Living ?  ?  ? ?Permission Sought/Granted ?  ?Permission granted to share information with : Yes, Verbal Permission Granted ?   ? Permission granted to share info w AGENCY: SNF ?   ?   ? ?Emotional Assessment ?  ?  ?  ?Orientation: : Oriented to Self ?Alcohol / Substance Use: Not Applicable ?Psych Involvement: No (comment) ? ?Admission diagnosis:  Suprapubic catheter ([place]) [Z93.59] ?ESBL (extended spectrum beta-lactamase) producing bacteria infection [A49.9, Z16.12] ?UTI due to extended-spectrum beta lactamase (ESBL) producing Escherichia coli [N39.0, B96.29, Z16.12] ?Patient Active Problem List  ? Diagnosis Date Noted  ? Pressure injury of skin 09/27/2021  ? UTI due to extended-spectrum beta lactamase (ESBL) producing Escherichia coli 09/26/2021  ? Hypokalemia 09/26/2021  ? Dehydration 09/26/2021  ? 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 Right great toe amputee ([place]) 07/17/2021  ? CAP  (community acquired pneumonia) 06/21/2021  ? Severe sepsis ([place]) 06/21/2021  ? SOB (shortness of breath) 06/21/2021  ? GERD (gastroesophageal reflux disease)   ? AMS (altered mental status) 05/11/2021  ? Hypotension 05/11/2021  ? Iron deficiency anemia 05/07/2021  ? Oral phase dysphagia 04/28/2021  ? Protein-calorie malnutrition, severe 03/27/2021  ? Toe infection   ? PVD (peripheral vascular disease) ([place])   ? Osteomyelitis of great toe of right foot ([place]) 03/20/2021  ? Wound infection 03/20/2021  ? Pacemaker 03/20/2021  ? Elevated alkaline phosphatase level 03/20/2021  ? Hyperlipidemia 03/20/2021  ? Hematuria 02/14/2021  ? Urinary tract infection associated with indwelling urethral catheter ([place])   ? Normocytic anemia 01/06/2021  ? Catheter-associated urinary tract infection ([place])  12/15/2020  ? Toxic metabolic encephalopathy 67/54/4920  ? Chronic indwelling Foley catheter 12/15/2020  ? HTN (hypertension) 12/15/2020  ? History of ESBL E. coli infection 12/15/2020  ? BPH (benign prostatic hyperplasia) 12/12/2019  ? Chronic systolic heart failure ([place]) 12/12/2019  ? ?PCP:  [name], NP ?Pharmacy:   ?[facility] ?[place] [address] ?Phone: [phone] Fax: [phone] ? ?[facility] ?[address] ?[place] [address] ?Phone: [phone] Fax: [phone] ? ? ? ? ?Social Determinants of Health (SDOH) Interventions ?  ? ?Readmission Risk Interventions ? ?  08/04/2021  ?  9:19 AM 06/24/2021  ? 11:26 AM 02/17/2021  ?  9:12 AM  ?Readmission Risk Prevention Plan  ?Transportation Screening Complete Complete Complete  ?Medication Review Press photographer) Complete Complete Complete  ?PCP or Specialist appointment within 3-5 days of discharge Complete Complete Complete  ?[place] or Home Care Consult Complete Complete Complete  ?SW Recovery Care/Counseling Consult Complete   Complete  ?Palliative Care Screening Not Applicable Not Applicable Not Applicable  ?Skilled Nursing Facility Complete Not Applicable Not Applicable  ? ? ? ?

## 2021-10-01 DIAGNOSIS — G9341 Metabolic encephalopathy: Secondary | ICD-10-CM | POA: Diagnosis not present

## 2021-10-01 DIAGNOSIS — N390 Urinary tract infection, site not specified: Secondary | ICD-10-CM | POA: Diagnosis not present

## 2021-10-01 DIAGNOSIS — B9629 Other Escherichia coli [E. coli] as the cause of diseases classified elsewhere: Secondary | ICD-10-CM | POA: Diagnosis not present

## 2021-10-01 DIAGNOSIS — Z1612 Extended spectrum beta lactamase (ESBL) resistance: Secondary | ICD-10-CM | POA: Diagnosis not present

## 2021-10-01 MED ORDER — QUETIAPINE FUMARATE 25 MG PO TABS
25.0000 mg | ORAL_TABLET | Freq: Two times a day (BID) | ORAL | Status: DC
  Administered 2021-10-01 – 2021-10-03 (×5): 25 mg via ORAL
  Filled 2021-10-01 (×4): qty 1

## 2021-10-01 NOTE — [place] FL2 (Signed)
?[place] MEDICAID FL2 LEVEL OF CARE SCREENING TOOL  ?  ? ?IDENTIFICATION  ?Patient Name: ?Todd Dunn Birthdate: [birth year] Sex: male Admission Date (Current Location): ?09/26/2021  ?[place] and [place] Number: ? [place] ?[id number] [place] and Address:  ?[facility] [place] [place] [address], [place], [place] [address] ?     Provider Number: ?[id number]  ?Attending Physician Name and Address:  ?Bonnielee Haff, MD ? Relative Name and Phone Number:  ?[name] (Granddaughter)   [phone] ?   ?Current Level of Care: ?Hospital Recommended Level of Care: ?[place] Prior Approval Number: ?  ? ?Date Approved/Denied: ?  PASRR Number: ?[id number] A ? ?Discharge Plan: ?SNF ?  ? ?Current Diagnoses: ?Patient Active Problem List  ? Diagnosis Date Noted  ? Pressure injury of skin 09/27/2021  ? UTI due to extended-spectrum beta lactamase (ESBL) producing Escherichia coli 09/26/2021  ? Hypokalemia 09/26/2021  ? Dehydration 09/26/2021  ? 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 Right great toe amputee ([place]) 07/17/2021  ? CAP (community acquired pneumonia) 06/21/2021  ? Severe sepsis ([place]) 06/21/2021  ? SOB (shortness of breath) 06/21/2021  ? GERD (gastroesophageal reflux disease)   ? AMS (altered mental status) 05/11/2021  ? Hypotension 05/11/2021  ? Iron deficiency anemia 05/07/2021  ? Oral phase dysphagia 04/28/2021  ? Protein-calorie malnutrition, severe 03/27/2021  ? Toe infection   ? PVD (peripheral vascular disease) ([place])   ? Osteomyelitis of great toe of right foot ([place]) 03/20/2021  ? Wound infection 03/20/2021  ? Pacemaker 03/20/2021  ? Elevated alkaline phosphatase level 03/20/2021  ? Hyperlipidemia 03/20/2021  ? Hematuria 02/14/2021  ? Urinary tract infection associated with indwelling urethral catheter ([place])   ? Normocytic anemia 01/06/2021  ? Catheter-associated urinary tract infection ([place]) 12/15/2020  ? Toxic metabolic encephalopathy 12/16/1973  ? Chronic indwelling Foley catheter  12/15/2020  ? HTN (hypertension) 12/15/2020  ? History of ESBL E. coli infection 12/15/2020  ? BPH (benign prostatic hyperplasia) 12/12/2019  ? Chronic systolic heart failure ([place]) 12/12/2019  ? ? ?Orientation RESPIRATION BLADDER Height & Weight   ?  ?Self ? Normal Continent Weight:   ?Height:     ?BEHAVIORAL SYMPTOMS/MOOD NEUROLOGICAL BOWEL NUTRITION STATUS  ?Other (Comment) (impulsive)   Incontinent Diet (see d/c summary)  ?AMBULATORY STATUS COMMUNICATION OF NEEDS Skin   ?Extensive Assist Verbally PU Stage and Appropriate Care (stage 3 sacrum) ?  ?  ?  ?    ?     ?     ? ? ?Personal Care Assistance Level of Assistance  ?Dressing, Feeding, Bathing Bathing Assistance: Maximum assistance ?Feeding assistance: Limited assistance ?Dressing Assistance: Maximum assistance ?   ? ?Functional Limitations Info  ?Sight, Hearing, Speech (expressive aphasia) Sight Info: Adequate ?Hearing Info: Adequate ?Speech Info: Impaired  ? ? ?SPECIAL CARE FACTORS FREQUENCY  ?PT (By licensed PT), OT (By licensed OT)   ?  ?PT Frequency: 5x/ week ?OT Frequency: 5x/ week ?  ?  ?  ?   ? ? ?Contractures    ? ? ?Additional Factors Info  ?Code Status, Allergies Code Status Info: Full ?Allergies Info: Food   Aspirin   Crestor (Rosuvastatin Calcium)   [place] Clam ([place]. [place]) Skin Test   Penicillins ?  ?  ?  ?   ? ?Current Medications (10/01/2021):  This is the current hospital active medication list ?Current Facility-Administered Medications  ?Medication Dose Route Frequency Provider Last Rate Last Admin  ? 0.9 %  sodium chloride infusion   Intravenous Continuous Bonnielee Haff, MD  30 mL/hr at 10/01/21 0600 Infusion Verify at 10/01/21 0600  ? acidophilus (RISAQUAD) capsule 2 capsule  2 capsule Oral Daily Wynetta Fines T, MD   2 capsule at 09/30/21 1058  ? albuterol (PROVENTIL) (2.5 MG/3ML) 0.083% nebulizer solution 2.5 mg  2.5 mg Nebulization Q6H PRN Wynetta Fines T, MD      ? amLODipine (NORVASC) tablet 10 mg  10 mg Oral Daily Wynetta Fines  T, MD   10 mg at 09/30/21 1059  ? clopidogrel (PLAVIX) tablet 75 mg  75 mg Oral QHS Lequita Halt, MD   75 mg at 09/30/21 2037  ? divalproex (DEPAKOTE) DR tablet 125 mg  125 mg Oral QHS Wynetta Fines T, MD   125 mg at 09/30/21 2037  ? enoxaparin (LOVENOX) injection 40 mg  40 mg Subcutaneous Q24H Wynetta Fines T, MD   40 mg at 09/30/21 1524  ? feeding supplement (ENSURE ENLIVE / ENSURE PLUS) liquid 237 mL  237 mL Oral TID BM [name], [name], MD   237 mL at 09/30/21 2041  ? haloperidol lactate (HALDOL) injection 5 mg  5 mg Intramuscular Q6H PRN Howerter, Justin B, DO   5 mg at 10/01/21 0035  ? HYDROcodone-acetaminophen (NORCO/VICODIN) 5-325 MG per tablet 1 tablet  1 tablet Oral Q6H PRN Wynetta Fines T, MD   1 tablet at 09/30/21 1757  ? meropenem (MERREM) 1 g in sodium chloride 0.9 % 100 mL IVPB  1 g Intravenous Q8H Wynona Dove A, DO   Paused at 10/01/21 0554  ? oxybutynin (DITROPAN) tablet 5 mg  5 mg Oral Q8H PRN Wynetta Fines T, MD      ? pantoprazole (PROTONIX) EC tablet 40 mg  40 mg Oral Daily Wynetta Fines T, MD   40 mg at 09/30/21 1059  ? polyethylene glycol (MIRALAX / GLYCOLAX) packet 17 g  17 g Oral Daily PRN Wynetta Fines T, MD      ? QUEtiapine (SEROQUEL) tablet 25 mg  25 mg Oral QHS [name], [name], MD   25 mg at 09/30/21 2037  ? vitamin B-12 (CYANOCOBALAMIN) tablet 1,000 mcg  1,000 mcg Oral Daily Wynetta Fines T, MD   1,000 mcg at 09/30/21 1059  ? ? ? ?Discharge Medications: ?Please see discharge summary for a list of discharge medications. ? ?Relevant Imaging Results: ? ?Relevant Lab Results: ? ? ?Additional Information ?[id number], 2 Covid Vaccines; 6' 130lbs ? ?Paulene Floor Raden [place], LCSWA ? ? ? ? ?

## 2021-10-01 NOTE — Progress Notes (Signed)
Occupational Therapy Treatment ?Patient Details ?Name: ALICE VITELLI ?MRN: [id number] ?DOB: 15-Jan-1941 ?Today's Date: 10/01/2021 ? ? ?History of present illness Issam L Talerico is a 81 y.o. male with medical history significant of indwelling suprapubic urinary catheter secondary to severe BPH, CAD, PVD status post right AKA, SSS status post ICD, HTN, CVA with chronic aphasia, presented with worsening of suprapubic abdominal pain and mentation changes. ?  ?OT comments ? Pt making incremental progress with OT goals this session. Overall, pt requiring max A +2 for all OOB mobility and ADLs. He is very impulsive and appears ataxic at times, throwing himself all over the bed, having difficulty following simple commands. Continuing to recommend SNF to maximize pt independence and safety.   ? ?Recommendations for follow up therapy are one component of a multi-disciplinary discharge planning process, led by the attending physician.  Recommendations may be updated based on patient status, additional functional criteria and insurance authorization. ?   ?Follow Up Recommendations ? Skilled nursing-short term rehab (<3 hours/day)  ?  ?Assistance Recommended at Discharge Frequent or constant Supervision/Assistance  ?Patient can return home with the following ? Two people to help with walking and/or transfers;Two people to help with bathing/dressing/bathroom;Assistance with cooking/housework;Assistance with feeding;Direct supervision/assist for medications management;Direct supervision/assist for financial management;Assist for transportation;Help with stairs or ramp for entrance ?  ?Equipment Recommendations ? None recommended by OT  ?  ?Recommendations for Other Services   ? ?  ?Precautions / Restrictions Precautions ?Precautions: Fall ?Precaution Comments: mittens ?Restrictions ?Weight Bearing Restrictions: No  ? ? ?  ? ?Mobility Bed Mobility ?Overal bed mobility: Needs Assistance ?Bed Mobility: Rolling, Sidelying to Sit,  Sit to Sidelying ?Rolling: Min guard ?Sidelying to sit: Min assist ?  ?  ?Sit to sidelying: Min assist ?General bed mobility comments: minA for LE management in/out of bed and slight trunk elevation. Impulsively laying on R side after sitting up grasping foot board. Reports fearful of falling off bed ?  ? ?Transfers ?Overall transfer level: Needs assistance ?Equipment used: 2 person hand held assist ?Transfers: Sit to/from Stand ?Sit to Stand: Max assist, +2 physical assistance, +2 safety/equipment ?  ?  ?  ?  ?  ?General transfer comment: maxA+2 to stand from EOB. Cues for patient to grasp therapist arm but patient reaching for tray table then ended up hugging around therapist waist due to fear of falling. Unable to achieve full standing ?  ?  ?Balance Overall balance assessment: Needs assistance ?Sitting-balance support: Feet supported, Bilateral upper extremity supported ?Sitting balance-Leahy Scale: Poor ?Sitting balance - Comments: min guard up to modA for sitting balance due to impulsivity ?  ?Standing balance support: Bilateral upper extremity supported ?Standing balance-Leahy Scale: Zero ?Standing balance comment: maxA+2 to stand and maintain ?  ?  ?  ?  ?  ?  ?  ?  ?  ?  ?  ?   ? ?ADL either performed or assessed with clinical judgement  ? ?ADL Overall ADL's : Needs assistance/impaired ?  ?  ?Grooming: Moderate assistance;Bed level ?Grooming Details (indicate cue type and reason): Pt too impulsive to safely complete seated ADL's - at bed level pt was able to wash his face with no assist, however he required OT to brush histeeth, due to ataxic movements and difficulty focusing on task to complete ?  ?  ?  ?  ?  ?  ?  ?  ?Toilet Transfer: Maximal assistance;+2 for physical assistance;+2 for safety/equipment;Stand-pivot ?Toilet Transfer Details (indicate cue type and  reason): pt resistive and posteriorly leaning, attemtpted HHA, pt ended up bear hugging PT and OT lifting with gait belt to stand. unsafe to  complete transfer this session ?  ?  ?  ?  ?  ?General ADL Comments: Pt very impulsive and ataxic this session, diffiuculty following commands ?  ? ?Extremity/Trunk Assessment   ?  ?  ?  ?  ?  ? ?Vision   ?  ?  ?Perception   ?  ?Praxis   ?  ? ?Cognition Arousal/Alertness: Awake/alert ?Behavior During Therapy: Restless ?Overall Cognitive Status: No family/caregiver present to determine baseline cognitive functioning ?Area of Impairment: Following commands, Safety/judgement ?  ?  ?  ?  ?  ?  ?  ?  ?  ?  ?  ?Following Commands: Follows one step commands inconsistently, Follows one step commands with increased time ?Safety/Judgement: Decreased awareness of safety, Decreased awareness of deficits ?  ?  ?General Comments: following ~50% of commands. Difficult to understand at times ?  ?  ?   ?Exercises   ? ?  ?Shoulder Instructions   ? ? ?  ?General Comments VSS on RA  ? ? ?Pertinent Vitals/ Pain       Pain Assessment ?Pain Assessment: No/denies pain ?Faces Pain Scale: No hurt ? ?Home Living   ?  ?  ?  ?  ?  ?  ?  ?  ?  ?  ?  ?  ?  ?  ?  ?  ?  ?  ? ?  ?Prior Functioning/Environment    ?  ?  ?  ?   ? ?Frequency ? Min 2X/week  ? ? ? ? ?  ?Progress Toward Goals ? ?OT Goals(current goals can now be found in the care plan section) ? Progress towards OT goals: Progressing toward goals ? ?Acute Rehab OT Goals ?Patient Stated Goal: none stated ?OT Goal Formulation: Patient unable to participate in goal setting ?Time For Goal Achievement: 10/13/21 ?Potential to Achieve Goals: Fair ?ADL Goals ?Pt Will Perform Eating: with supervision;sitting ?Pt Will Perform Grooming: with set-up;sitting ?Pt Will Transfer to Toilet: with min assist;squat pivot transfer;bedside commode ?Pt Will Perform Toileting - Clothing Manipulation and hygiene: with min guard assist;sitting/lateral leans ?Additional ADL Goal #1: Pt will follow commands 75% of the time for ADL completion  ?Plan Discharge plan remains appropriate;Frequency remains appropriate    ? ?Co-evaluation ? ? ? PT/OT/SLP Co-Evaluation/Treatment: Yes ?Reason for Co-Treatment: Necessary to address cognition/behavior during functional activity;For patient/therapist safety;To address functional/ADL transfers ?PT goals addressed during session: Mobility/safety with mobility;Balance ?OT goals addressed during session: Other (comment) (bed mobility/transfers to WC/BSC) ?  ? ?  ?AM-PAC OT "6 Clicks" Daily Activity     ?Outcome Measure ? ? Help from another person eating meals?: A Lot ?Help from another person taking care of personal grooming?: A Lot ?Help from another person toileting, which includes using toliet, bedpan, or urinal?: Total ?Help from another person bathing (including washing, rinsing, drying)?: A Lot ?Help from another person to put on and taking off regular upper body clothing?: A Lot ?Help from another person to put on and taking off regular lower body clothing?: Total ?6 Click Score: 10 ? ?  ?End of Session Equipment Utilized During Treatment: Gait belt ? ?OT Visit Diagnosis: Other abnormalities of gait and mobility (R26.89);Muscle weakness (generalized) (M62.81);Other symptoms and signs involving cognitive function ?  ?Activity Tolerance Patient tolerated treatment well ?  ?Patient Left in bed;with call bell/phone within reach;with bed  alarm set;with restraints reapplied ?  ?Nurse Communication Mobility status ?  ? ?   ? ?Time: 2909-0301 ?OT Time Calculation (min): 24 min ? ?Charges: OT General Charges ?$OT Visit: 1 Visit ?OT Treatments ?$Self Care/Home Management : 8-22 mins ? ?Kolin Erdahl H., OTR/L ?Acute Rehabilitation ? ?Aiken Withem Elane Yolanda Bonine ?10/01/2021, 5:57 PM ?

## 2021-10-01 NOTE — Progress Notes (Signed)
Physical Therapy Treatment ?Patient Details ?Name: Todd Dunn ?MRN: [id number] ?DOB: [birth year] ?Today's Date: 10/01/2021 ? ? ?History of Present Illness Jamichael L Hale is a 81 y.o. male with medical history significant of indwelling suprapubic urinary catheter secondary to severe BPH, CAD, PVD status post right AKA, SSS status post ICD, HTN, CVA with chronic aphasia, presented with worsening of suprapubic abdominal pain and mentation changes. ? ?  ?PT Comments  ? ? Patient initially not agreeable to mobility but with encouragement patient agreeable. Patient required minA for bed mobility but impulsive in sitting with sudden leaning to one side or the other without warning. Up to modA to maintain sitting balance. Patient reporting fearful of falling. Attempted standing with HHAx2 with maxA+2 but he began reaching for objects and ultimately hugged around therapist waist for support due to fear. Continue to recommend SNF for ongoing Physical Therapy.    ?   ?Recommendations for follow up therapy are one component of a multi-disciplinary discharge planning process, led by the attending physician.  Recommendations may be updated based on patient status, additional functional criteria and insurance authorization. ? ?Follow Up Recommendations ? Skilled nursing-short term rehab (<3 hours/day) ?  ?  ?Assistance Recommended at Discharge Frequent or constant Supervision/Assistance  ?Patient can return home with the following   ?  ?Equipment Recommendations ? Other (comment) (TBD)  ?  ?Recommendations for Other Services   ? ? ?  ?Precautions / Restrictions Precautions ?Precautions: Fall ?Precaution Comments: mittens ?Restrictions ?Weight Bearing Restrictions: No  ?  ? ?Mobility ? Bed Mobility ?Overal bed mobility: Needs Assistance ?Bed Mobility: Rolling, Sidelying to Sit, Sit to Sidelying ?Rolling: Min guard ?Sidelying to sit: Min assist ?  ?  ?Sit to sidelying: Min assist ?General bed mobility comments: minA for LE  management in/out of bed and slight trunk elevation. Impulsively laying on R side after sitting up grasping foot board. Reports fearful of falling off bed ?  ? ?Transfers ?Overall transfer level: Needs assistance ?Equipment used: 2 person hand held assist ?Transfers: Sit to/from Stand ?Sit to Stand: Max assist, +2 physical assistance, +2 safety/equipment ?  ?  ?  ?  ?  ?General transfer comment: maxA+2 to stand from EOB. Cues for patient to grasp therapist arm but patient reaching for tray table then ended up hugging around therapist waist due to fear of falling. Unable to achieve full standing ?  ? ?Ambulation/Gait ?  ?  ?  ?  ?  ?  ?  ?  ? ? ?Stairs ?  ?  ?  ?  ?  ? ? ?Wheelchair Mobility ?  ? ?Modified Rankin (Stroke Patients Only) ?  ? ? ?  ?Balance Overall balance assessment: Needs assistance ?Sitting-balance support: Feet supported, Bilateral upper extremity supported ?Sitting balance-Leahy Scale: Poor ?Sitting balance - Comments: min guard up to modA for sitting balance due to impulsivity ?  ?Standing balance support: Bilateral upper extremity supported ?Standing balance-Leahy Scale: Zero ?Standing balance comment: maxA+2 to stand and maintain ?  ?  ?  ?  ?  ?  ?  ?  ?  ?  ?  ?  ? ?  ?Cognition Arousal/Alertness: Awake/alert ?Behavior During Therapy: Restless ?Overall Cognitive Status: No family/caregiver present to determine baseline cognitive functioning ?Area of Impairment: Following commands, Safety/judgement ?  ?  ?  ?  ?  ?  ?  ?  ?  ?  ?  ?Following Commands: Follows one step commands inconsistently, Follows one step commands with increased  time ?Safety/Judgement: Decreased awareness of safety, Decreased awareness of deficits ?  ?  ?General Comments: following ~50% of commands. Difficult to understand at times ?  ?  ? ?  ?Exercises   ? ?  ?General Comments   ?  ?  ? ?Pertinent Vitals/Pain Pain Assessment ?Pain Assessment: Faces ?Faces Pain Scale: No hurt ?Pain Intervention(s): Monitored during session   ? ? ?Home Living   ?  ?  ?  ?  ?  ?  ?  ?  ?  ?   ?  ?Prior Function    ?  ?  ?   ? ?PT Goals (current goals can now be found in the care plan section) Acute Rehab PT Goals ?PT Goal Formulation: Patient unable to participate in goal setting ?Time For Goal Achievement: 10/12/21 ?Potential to Achieve Goals: Fair ?Progress towards PT goals: Progressing toward goals ? ?  ?Frequency ? ? ? Min 2X/week ? ? ? ?  ?PT Plan Current plan remains appropriate  ? ? ?Co-evaluation PT/OT/SLP Co-Evaluation/Treatment: Yes ?Reason for Co-Treatment: Necessary to address cognition/behavior during functional activity;For patient/therapist safety;To address functional/ADL transfers ?PT goals addressed during session: Mobility/safety with mobility;Balance ?  ?  ? ?  ?AM-PAC PT "6 Clicks" Mobility   ?Outcome Measure ? Help needed turning from your back to your side while in a flat bed without using bedrails?: A Little ?Help needed moving from lying on your back to sitting on the side of a flat bed without using bedrails?: A Little ?Help needed moving to and from a bed to a chair (including a wheelchair)?: Total ?Help needed standing up from a chair using your arms (e.g., wheelchair or bedside chair)?: Total ?Help needed to walk in hospital room?: Total ?Help needed climbing 3-5 steps with a railing? : Total ?6 Click Score: 10 ? ?  ?End of Session Equipment Utilized During Treatment: Gait belt ?Activity Tolerance: Patient tolerated treatment well ?Patient left: in bed;with call bell/phone within reach;with bed alarm set;with restraints reapplied ?Nurse Communication: Mobility status ?PT Visit Diagnosis: Other abnormalities of gait and mobility (R26.89) ?  ? ? ?Time: 2440-1027 ?PT Time Calculation (min) (ACUTE ONLY): 24 min ? ?Charges:  $Therapeutic Activity: 8-22 mins          ?          ? ?Waneta Fitting A. Gilford Rile, PT, DPT ?Acute Rehabilitation Services ?Pager [phone] ?Office [phone] ? ? ? ?Brayon Bielefeld A Julene Rahn ?10/01/2021, 5:32 PM ? ?

## 2021-10-01 NOTE — Progress Notes (Signed)
?PROGRESS NOTE ? ? ? ?Todd Dunn  GBT:[id number] DOB: 01/28/1941 DOA: 09/26/2021 ?PCP: [name], NP  ? ? ?Brief Narrative:  ?Todd Dunn is a 81 y.o. male with medical history significant of indwelling suprapubic urinary catheter secondary to severe BPH, CAD, PVD status post right AKA, SSS status post ICD, HTN, CVA with chronic aphasia, presented with worsening of suprapubic abdominal pain and mentation changes.  ? ?Assessment & Plan: ?  ?Principal Problem: ?  UTI due to extended-spectrum beta lactamase (ESBL) producing Escherichia coli ?Active Problems: ?  History of ESBL E. coli infection ?  Hypokalemia ?  Dehydration ?  Pressure injury of skin ? ?ESBL UTI due to suprapubic catheter ?-Outpatient urine culture growing ESBL.  Patient currently on meropenem.  Plan for a 7-day treatment.  Patient was started on meropenem on 4/22. We will plan to give treatment till 4/28.  ?-Suprapubic catheter was reinserted in ED. ? ?Acute metabolic encephalopathy ?Apparently according to granddaughter at baseline patient is awake alert appropriate.  No history of dementia. ?His alteration in mental status is thought to be secondary to UTI. ?Avoid benzodiazepines.  Can use Haldol as needed for agitation.  He was started on Seroquel for agitation.  Patient required 2 doses of Haldol overnight.  We will increase the dose of the Seroquel. ?Mentation appears to be improving.  Monitor closely. ?He is with significant aphasia which sometimes can be mistakenly taken as confusion. ?  ?Hypokalemia ?This was repleted. ?Recheck labs tomorrow. ?  ?Severe dehydration ?Improved with IV fluids.  He is now tolerating a diet. ?  ?Malfunction of suprapubic urine catheter ?Suprapubic catheter replaced in ED. ?  ?Essential hypertension ?Blood pressure is reasonably well controlled.  Continue to monitor.  Noted to be on amlodipine ?  ?PAD ?-Continue Plavix ?- S/P Right AKA ? ?Chronic combined systolic and diastolic CHF ?S/P AICD.  Echocardiogram from October 2022 showed EF of 30 to 35% with grade 1 diastolic dysfunction. ? ?History of CVA with residual dysarthria, dysphagia ?-Continue with Plavix ?-unable to take statin due to muscle pain as discussed with daughter ? ?SSS ?- S/P AICD ? ?Protien calorie malnutrition/hypophosphatemia ?Phosphorus repleted.  Encourage oral intake. ? ?DVT prophylaxis: Swift lovenox ?Code Status: Full ?Family Communication: No family at bedside.  Discussed with his granddaughter yesterday. ?Disposition: Will need SNF placement ? ? ?Status is: Inpatient ?Remains inpatient appropriate because: remains Altered on IV ABX(no oral option currently) ?  ?Consultants:  ?None ? ?Subjective: ? ?Answering questions today but still distracted.  Less agitated compared to yesterday. ? ? ?Objective: ?Vitals:  ? 09/30/21 0531 09/30/21 0900 09/30/21 1742 09/30/21 2000  ?BP: (!) 91/19 128/80 (!) 148/103 (!) 121/58  ?Pulse: 77 80 76 79  ?Resp: 18   18  ?Temp: (!) 97.5 ?F (36.4 ?C) 98 ?F (36.7 ?C) 100.1 ?F (37.8 ?C) 98.5 ?F (36.9 ?C)  ?TempSrc: Oral Axillary Oral Oral  ?SpO2: 99% 98% 98% 100%  ? ? ?Intake/Output Summary (Last 24 hours) at 10/01/2021 0938 ?Last data filed at 10/01/2021 0800 ?Gross per 24 hour  ?Intake 3438.9 ml  ?Output 625 ml  ?Net 2813.9 ml  ? ? ?There were no vitals filed for this visit. ? ? ?Examination: ? ?General appearance: Awake alert.  In no distress.  Remains distracted ?Resp: Clear to auscultation bilaterally.  Normal effort ?Cardio: S1-S2 is normal regular.  No S3-S4.  No rubs murmurs or bruit ?GI: Abdomen is soft.  Nontender nondistended.  Bowel sounds are present normal.  No  masses organomegaly.  Suprapubic catheter noted. ?Extremities: No edema.  Full range of motion of lower extremities. ?Neurologic: No focal neurological deficits.  ? ? ? ? ? ?Data Reviewed: I have personally reviewed following labs and imaging studies ? ?CBC: ?Recent Labs  ?Lab 09/24/21 ?1015 09/26/21 ?1100 09/28/21 ?0406 09/29/21 ?0156   ?WBC 5.9 5.9 5.4 6.1  ?NEUTROABS  --  4.3  --   --   ?HGB 12.0* 14.0 11.6* 12.7*  ?HCT 39.2 44.8 37.1* 39.2  ?MCV 89.5 89.8 89.4 86.7  ?PLT 194 183 156 181  ? ? ? ?Basic Metabolic Panel: ?Recent Labs  ?Lab 09/24/21 ?1015 09/26/21 ?1100 09/26/21 ?1858 09/28/21 ?0406 09/29/21 ?8315 09/30/21 ?1761  ?NA 144 142  --  139 136  --   ?K 3.2* 3.3*  --  3.6 4.3  --   ?CL 111 106  --  105 105  --   ?CO2 26 26  --  29 25  --   ?GLUCOSE 93 114*  --  106* 131*  --   ?BUN 13 13  --  10 9  --   ?CREATININE 0.75 0.83  --  0.60* 0.54*  --   ?CALCIUM 9.9 10.2  --  9.3 9.6  --   ?MG  --   --  2.6* 2.1  --   --   ?PHOS  --   --  2.6 2.2* 2.6 2.4*  ? ? ? ?GFR: ?CrCl cannot be calculated (Unknown ideal weight.). ? ?Liver Function Tests: ?Recent Labs  ?Lab 09/26/21 ?1100  ?AST 17  ?ALT 10  ?ALKPHOS 133*  ?BILITOT 1.0  ?PROT 6.8  ?ALBUMIN 3.3*  ? ? ? ?CBG: ?No results for input(s): GLUCAP in the last 168 hours. ? ? ?Recent Results (from the past 240 hour(s))  ?Urine Culture     Status: Abnormal  ? Collection Time: 09/24/21 10:25 AM  ? Specimen: Urine, Clean Catch  ?Result Value Ref Range Status  ? Specimen Description URINE, CLEAN CATCH  Final  ? Special Requests   Final  ?  NONE ?Performed at [facility], [place] [address], [place], [place] [address] ?  ? Culture (A)  Final  ?  >=100,000 COLONIES/mL ESCHERICHIA COLI ?Confirmed Extended Spectrum Beta-Lactamase Producer (ESBL).  In bloodstream infections from ESBL organisms, carbapenems are preferred over piperacillin/tazobactam. They are shown to have a lower risk of mortality. ?  ? Report Status 09/26/2021 FINAL  Final  ? Organism ID, Bacteria ESCHERICHIA COLI (A)  Final  ?    Susceptibility  ? Escherichia coli - MIC*  ?  AMPICILLIN >=32 RESISTANT Resistant   ?  CEFAZOLIN >=64 RESISTANT Resistant   ?  CEFEPIME 16 RESISTANT Resistant   ?  CEFTRIAXONE >=64 RESISTANT Resistant   ?  CIPROFLOXACIN >=4 RESISTANT Resistant   ?  GENTAMICIN >=16 RESISTANT Resistant   ?  IMIPENEM <=0.25  SENSITIVE Sensitive   ?  NITROFURANTOIN <=16 SENSITIVE Sensitive   ?  TRIMETH/SULFA >=320 RESISTANT Resistant   ?  AMPICILLIN/SULBACTAM >=32 RESISTANT Resistant   ?  PIP/TAZO >=128 RESISTANT Resistant   ?  * >=100,000 COLONIES/mL ESCHERICHIA COLI  ? ?  ? ? ? ? ? ?Radiology Studies: ?No results found. ? ? ?Scheduled Meds: ? acidophilus  2 capsule Oral Daily  ? amLODipine  10 mg Oral Daily  ? clopidogrel  75 mg Oral QHS  ? divalproex  125 mg Oral QHS  ? enoxaparin (LOVENOX) injection  40 mg Subcutaneous Q24H  ? feeding  supplement  237 mL Oral TID BM  ? pantoprazole  40 mg Oral Daily  ? QUEtiapine  25 mg Oral QHS  ? vitamin B-12  1,000 mcg Oral Daily  ? ?Continuous Infusions: ? sodium chloride 30 mL/hr at 10/01/21 0600  ? meropenem (MERREM) IV Stopped (10/01/21 0554)  ? ? ? LOS: 4 days  ? ? ?Bonnielee Haff, MD ?[facility] ? ? ?10/01/2021, 9:38 AM  ? ? ?

## 2021-10-01 NOTE — Telephone Encounter (Signed)
Patient currently hospitalized.

## 2021-10-01 NOTE — Plan of Care (Signed)
  Problem: Health Behavior/Discharge Planning: Goal: Ability to manage health-related needs will improve Outcome: Not Progressing   

## 2021-10-02 LAB — BASIC METABOLIC PANEL
Anion gap: 4 — ABNORMAL LOW (ref 5–15)
BUN: 17 mg/dL (ref 8–23)
CO2: 30 mmol/L (ref 22–32)
Calcium: 10.3 mg/dL (ref 8.9–10.3)
Chloride: 105 mmol/L (ref 98–111)
Creatinine, Ser: 0.55 mg/dL — ABNORMAL LOW (ref 0.61–1.24)
GFR, Estimated: >60 mL/min (ref >60)
Glucose, Bld: 96 mg/dL (ref 70–99)
Potassium: 3.9 mmol/L (ref 3.5–5.1)
Sodium: 139 mmol/L (ref 135–145)

## 2021-10-02 LAB — CBC
HCT: 38.5 % — ABNORMAL LOW (ref 39.0–52.0)
Hemoglobin: 12.3 g/dL — ABNORMAL LOW (ref 13.0–17.0)
MCH: 28 pg (ref 26.0–34.0)
MCHC: 31.9 g/dL (ref 30.0–36.0)
MCV: 87.5 fL (ref 80.0–100.0)
Platelets: 191 10*3/uL (ref 150–400)
RBC: 4.4 MIL/uL (ref 4.22–5.81)
RDW: 15.9 % — ABNORMAL HIGH (ref 11.5–15.5)
WBC: 3.8 10*3/uL — ABNORMAL LOW (ref 4.0–10.5)
nRBC: 0 % (ref 0.0–0.2)

## 2021-10-02 NOTE — Plan of Care (Signed)

## 2021-10-02 NOTE — Progress Notes (Signed)
?PROGRESS NOTE ? ? ? ?DANDREA Dunn  EVO:[id number] DOB: [birth year] DOA: 09/26/2021 ?PCP: [name], NP  ? ? ?Brief Narrative:  ?Todd Dunn is a 81 y.o. male with medical history significant of indwelling suprapubic urinary catheter secondary to severe BPH, CAD, PVD status post right AKA, SSS status post ICD, HTN, CVA with chronic aphasia, presented with worsening of suprapubic abdominal pain and mentation changes.  ? ?Assessment & Plan: ?  ?Principal Problem: ?  UTI due to extended-spectrum beta lactamase (ESBL) producing Escherichia coli ?Active Problems: ?  History of ESBL E. coli infection ?  Hypokalemia ?  Dehydration ?  Pressure injury of skin ? ?ESBL UTI due to suprapubic catheter ?-Outpatient urine culture growing ESBL.  Patient started on meropenem.  Plan for a 7-day treatment.  Patient was started on meropenem on 4/22.  Will complete 7 days tomorrow morning. ?-Suprapubic catheter was reinserted in ED. ? ?Acute metabolic encephalopathy ?Apparently according to granddaughter at baseline patient is awake alert appropriate.  No history of dementia. ?His alteration in mental status is thought to be secondary to UTI. ?Mentation has significantly improved.  Could be back to his baseline.  Has not required Haldol in the last 24 hours.  Will discontinue.  Continue current dose of Seroquel. ?He is with significant aphasia which sometimes can be mistakenly taken as confusion. ?  ?Hypokalemia ?Repleted. ?  ?Severe dehydration ?Improved with IV fluids.  He is now tolerating a diet. ?  ?Malfunction of suprapubic urine catheter ?Suprapubic catheter replaced in ED. ?  ?Essential hypertension ?Blood pressure is reasonably well controlled.  Continue to monitor.  Noted to be on amlodipine ?  ?PAD ?-Continue Plavix ?- S/P Right AKA ? ?Chronic combined systolic and diastolic CHF ?S/P AICD. Echocardiogram from October 2022 showed EF of 30 to 35% with grade 1 diastolic dysfunction. ? ?History of CVA with  residual dysarthria, dysphagia ?-Continue with Plavix ?-Unable to take statin due to muscle pain as discussed with family ? ?SSS ?- S/P AICD ? ?Protien calorie malnutrition/hypophosphatemia ?Phosphorus repleted.  Encourage oral intake. ? ?DVT prophylaxis:  lovenox ?Code Status: Full ?Family Communication: No family at the bedside.  We will call you granddaughter today. ?Disposition: Will need SNF placement.  Anticipate discharge tomorrow. ? ? ?Status is: Inpatient ?Remains inpatient appropriate because: remains Altered on IV ABX (no oral option currently) ?  ?Consultants:  ?None ? ?Subjective: ? ?Answering questions today but still distracted.  Less agitated compared to yesterday. ? ? ?Objective: ?Vitals:  ? 10/01/21 1700 10/01/21 2125 10/02/21 0508 10/02/21 0950  ?BP: 124/86 (!) 135/99 131/78 128/71  ?Pulse: 73 60 (!) 46 (!) 57  ?Resp: '17 16 18 17  '$ ?Temp: 98.2 ?F (36.8 ?C) 98.2 ?F (36.8 ?C) 98 ?F (36.7 ?C) 98.1 ?F (36.7 ?C)  ?TempSrc:      ?SpO2: 99% 100% 100% 98%  ? ? ?Intake/Output Summary (Last 24 hours) at 10/02/2021 1035 ?Last data filed at 10/02/2021 0800 ?Gross per 24 hour  ?Intake 944.04 ml  ?Output 1700 ml  ?Net -755.96 ml  ? ? ?There were no vitals filed for this visit. ? ? ?Examination: ? ?General appearance: Awake alert.  In no distress ?Resp: Clear to auscultation bilaterally.  Normal effort ?Cardio: S1-S2 is normal regular.  No S3-S4.  No rubs murmurs or bruit ?GI: Abdomen is soft.  Nontender nondistended.  Bowel sounds are present normal.  No masses organomegaly ?Extremities: No edema.  Status post amputation on the right ?Neurologic:   No focal  neurological deficits.  ? ? ? ? ? ? ? ?Data Reviewed: I have personally reviewed following labs and imaging studies ? ?CBC: ?Recent Labs  ?Lab 09/26/21 ?1100 09/28/21 ?0406 09/29/21 ?0156 10/02/21 ?0401  ?WBC 5.9 5.4 6.1 3.8*  ?NEUTROABS 4.3  --   --   --   ?HGB 14.0 11.6* 12.7* 12.3*  ?HCT 44.8 37.1* 39.2 38.5*  ?MCV 89.8 89.4 86.7 87.5  ?PLT 183 156 181 191   ? ? ? ?Basic Metabolic Panel: ?Recent Labs  ?Lab 09/26/21 ?1100 09/26/21 ?1858 09/28/21 ?0406 09/29/21 ?4431 09/30/21 ?5400 10/02/21 ?0401  ?NA 142  --  139 136  --  139  ?K 3.3*  --  3.6 4.3  --  3.9  ?CL 106  --  105 105  --  105  ?CO2 26  --  29 25  --  30  ?GLUCOSE 114*  --  106* 131*  --  96  ?BUN 13  --  10 9  --  17  ?CREATININE 0.83  --  0.60* 0.54*  --  0.55*  ?CALCIUM 10.2  --  9.3 9.6  --  10.3  ?MG  --  2.6* 2.1  --   --   --   ?PHOS  --  2.6 2.2* 2.6 2.4*  --   ? ? ? ?GFR: ?CrCl cannot be calculated (Unknown ideal weight.). ? ?Liver Function Tests: ?Recent Labs  ?Lab 09/26/21 ?1100  ?AST 17  ?ALT 10  ?ALKPHOS 133*  ?BILITOT 1.0  ?PROT 6.8  ?ALBUMIN 3.3*  ? ? ? ?CBG: ?No results for input(s): GLUCAP in the last 168 hours. ? ? ?Recent Results (from the past 240 hour(s))  ?Urine Culture     Status: Abnormal  ? Collection Time: 09/24/21 10:25 AM  ? Specimen: Urine, Clean Catch  ?Result Value Ref Range Status  ? Specimen Description URINE, CLEAN CATCH  Final  ? Special Requests   Final  ?  NONE ?Performed at [facility], [place] [address], [place], [place] [address] ?  ? Culture (A)  Final  ?  >=100,000 COLONIES/mL ESCHERICHIA COLI ?Confirmed Extended Spectrum Beta-Lactamase Producer (ESBL).  In bloodstream infections from ESBL organisms, carbapenems are preferred over piperacillin/tazobactam. They are shown to have a lower risk of mortality. ?  ? Report Status 09/26/2021 FINAL  Final  ? Organism ID, Bacteria ESCHERICHIA COLI (A)  Final  ?    Susceptibility  ? Escherichia coli - MIC*  ?  AMPICILLIN >=32 RESISTANT Resistant   ?  CEFAZOLIN >=64 RESISTANT Resistant   ?  CEFEPIME 16 RESISTANT Resistant   ?  CEFTRIAXONE >=64 RESISTANT Resistant   ?  CIPROFLOXACIN >=4 RESISTANT Resistant   ?  GENTAMICIN >=16 RESISTANT Resistant   ?  IMIPENEM <=0.25 SENSITIVE Sensitive   ?  NITROFURANTOIN <=16 SENSITIVE Sensitive   ?  TRIMETH/SULFA >=320 RESISTANT Resistant   ?  AMPICILLIN/SULBACTAM >=32 RESISTANT Resistant    ?  PIP/TAZO >=128 RESISTANT Resistant   ?  * >=100,000 COLONIES/mL ESCHERICHIA COLI  ? ?  ? ? ?Radiology Studies: ?No results found. ? ? ?Scheduled Meds: ? acidophilus  2 capsule Oral Daily  ? amLODipine  10 mg Oral Daily  ? clopidogrel  75 mg Oral QHS  ? divalproex  125 mg Oral QHS  ? enoxaparin (LOVENOX) injection  40 mg Subcutaneous Q24H  ? feeding supplement  237 mL Oral TID BM  ? pantoprazole  40 mg Oral Daily  ? QUEtiapine  25 mg  Oral BID  ? vitamin B-12  1,000 mcg Oral Daily  ? ?Continuous Infusions: ? sodium chloride 30 mL/hr at 10/01/21 0600  ? meropenem (MERREM) IV 1 g (10/02/21 2929)  ? ? ? LOS: 5 days  ? ? ?Bonnielee Haff, MD ?[facility] ? ? ?10/02/2021, 10:35 AM  ? ? ?

## 2021-10-02 NOTE — TOC Progression Note (Signed)
Transition of Care (TOC) - Progression Note  ? ? ?Patient Details  ?Name: Todd Dunn ?MRN: [id number] ?Date of Birth: November 16, 1940 ? ?Transition of Care (TOC) CM/SW Contact  ?[place], LCSW ?Phone Number: ?10/02/2021, 10:19 AM ? ?Clinical Narrative:    ? ?CSW called pt's grandaughter and provided SNF offers. CSW did explain that [facility] do not have admissions over the weekend and pt is expected to be medically ready tomorrow. Grandaughter is agreeable to [facility].  ? ?CSW submitted SNF auth request in [employer]; SN[facility] is pending.  ? ?Expected Discharge Plan: [place] ?Barriers to Discharge: Continued Medical Work up, Ship broker ? ?Expected Discharge Plan and Services ?Expected Discharge Plan: [place] ?  ?  ?  ?Living arrangements for the past 2 months: [place] ?                ?  ?  ?  ?  ?  ?  ?  ?  ?  ?  ? ? ?Social Determinants of Health (SDOH) Interventions ?  ? ?Readmission Risk Interventions ? ?  08/04/2021  ?  9:19 AM 06/24/2021  ? 11:26 AM 02/17/2021  ?  9:12 AM  ?Readmission Risk Prevention Plan  ?Transportation Screening Complete Complete Complete  ?Medication Review Press photographer) Complete Complete Complete  ?PCP or Specialist appointment within 3-5 days of discharge Complete Complete Complete  ?[place] or Home Care Consult Complete Complete Complete  ?SW Recovery Care/Counseling Consult Complete  Complete  ?Palliative Care Screening Not Applicable Not Applicable Not Applicable  ?Skilled Nursing Facility Complete Not Applicable Not Applicable  ? ? ?

## 2021-10-03 MED ORDER — ENSURE ENLIVE PO LIQD: 237.0000 mL | Freq: Three times a day (TID) | ORAL | 12 refills | Status: DC

## 2021-10-03 MED ORDER — POLYETHYLENE GLYCOL 3350 17 G PO PACK: 17.0000 g | PACK | Freq: Every day | ORAL | 0 refills | Status: DC

## 2021-10-03 MED ORDER — SENNOSIDES-DOCUSATE SODIUM 8.6-50 MG PO TABS: 2.0000 | ORAL_TABLET | Freq: Two times a day (BID) | ORAL | Status: DC

## 2021-10-03 MED ORDER — HYDROCODONE-ACETAMINOPHEN 5-325 MG PO TABS: 1.0000 | ORAL_TABLET | Freq: Four times a day (QID) | ORAL | 0 refills | Status: DC | PRN

## 2021-10-03 MED ORDER — SENNOSIDES-DOCUSATE SODIUM 8.6-50 MG PO TABS
2.0000 | ORAL_TABLET | Freq: Two times a day (BID) | ORAL | Status: DC
  Administered 2021-10-03: 2 via ORAL
  Filled 2021-10-03: qty 2

## 2021-10-03 MED ORDER — POLYETHYLENE GLYCOL 3350 17 G PO PACK: 17.0000 g | PACK | Freq: Every day | ORAL | Status: DC

## 2021-10-03 MED ORDER — QUETIAPINE FUMARATE 50 MG PO TABS: 50.0000 mg | ORAL_TABLET | Freq: Two times a day (BID) | ORAL | 0 refills | Status: DC

## 2021-10-03 MED ORDER — RISAQUAD PO CAPS
2.0000 | ORAL_CAPSULE | Freq: Every day | ORAL | Status: DC
Start: 2021-10-04 — End: 2021-10-19

## 2021-10-03 MED ORDER — QUETIAPINE FUMARATE 50 MG PO TABS
50.0000 mg | ORAL_TABLET | Freq: Two times a day (BID) | ORAL | Status: DC
Start: 2021-10-03 — End: 2021-10-04

## 2021-10-03 MED ORDER — FLEET ENEMA 7-19 GM/118ML RE ENEM
1.0000 | ENEMA | Freq: Once | RECTAL | Status: DC
  Filled 2021-10-03: qty 1

## 2021-10-03 MED ORDER — QUETIAPINE FUMARATE 25 MG PO TABS
25.0000 mg | ORAL_TABLET | Freq: Once | ORAL | Status: AC
  Administered 2021-10-03: 25 mg via ORAL
  Filled 2021-10-03: qty 1

## 2021-10-03 NOTE — TOC Transition Note (Signed)
Transition of Care (TOC) - CM/SW Discharge Note ? ? ?Patient Details  ?Name: Todd Dunn ?MRN: [id number] ?Date of Birth: 1940-06-25 ? ?Transition of Care (TOC) CM/SW Contact:  ?Amador Cunas, LCSW ?Phone Number: ?10/03/2021, 3:52 PM ? ? ?Clinical Narrative:  Pt for dc to [facility] today. DC summary faxed to facility [phone] at their request. RN has given report and packet complete including signed paper Rx. SW spoke to pt's granddtr [name] [phone] who reports agreeable to dc plan. [facility] called for transport. SW signing off at dc.  ? ?Wandra Feinstein, MSW, LCSW ?[phone] (coverage) ? ?  ? ? ? ?Final next level of care: [place] ?Barriers to Discharge: No Barriers Identified ? ? ?Patient Goals and CMS Choice ?  ?CMS Medicare.gov Compare Post Acute Care list provided to:: Patient Represenative (must comment) ?Choice offered to / list presented to : Adult Children ? ?Discharge Placement ?  ?           ?Patient chooses bed at:  [facility]) ?Patient to be transferred to facility by: [facility] ?Name of family member notified: Sandra/granddtr ?Patient and family notified of of transfer: 10/03/21 ? ?Discharge Plan and Services ?  ?  ?           ?  ?  ?  ?  ?  ?  ?  ?  ?  ?  ? ?Social Determinants of Health (SDOH) Interventions ?  ? ? ?Readmission Risk Interventions ? ?  08/04/2021  ?  9:19 AM 06/24/2021  ? 11:26 AM 02/17/2021  ?  9:12 AM  ?Readmission Risk Prevention Plan  ?Transportation Screening Complete Complete Complete  ?Medication Review Press photographer) Complete Complete Complete  ?PCP or Specialist appointment within 3-5 days of discharge Complete Complete Complete  ?[place] or Home Care Consult Complete Complete Complete  ?SW Recovery Care/Counseling Consult Complete  Complete  ?Palliative Care Screening Not Applicable Not Applicable Not Applicable  ?Skilled Nursing Facility Complete Not Applicable Not Applicable  ? ? ? ? ? ?

## 2021-10-03 NOTE — Discharge Summary (Addendum)
?[facility] ? ?Physician Discharge Summary  ? ?Patient ID: ?Todd Dunn ?MRN: [id number] ?DOB/AGE: 83-Dec-1942 83 y.o. ? ?Admit date: 09/26/2021 ?Discharge date:   10/03/2021 ? ? ?PCP: [name], NP ? ?DISCHARGE DIAGNOSES:  ?ESBL UTI ?Suprapubic catheter. ?Acute metabolic encephalopathy, improved ?Essential hypertension ?Peripheral artery disease ?Status post right AKA  ?Chronic combined systolic and diastolic CHF ?History of stroke ? ? ?RECOMMENDATIONS FOR OUTPATIENT FOLLOW UP: ?Suprapubic catheter care.  Follow-up with urology as needed ?CBC and basic metabolic panel to be checked in 1 week ?Please refer to cardiology for his systolic CHF ? ? ? ?Home Health: Going to SNF ?Equipment/Devices: None ? ?CODE STATUS: Full code ? ?DISCHARGE CONDITION: fair ? ?Diet recommendation: Heart healthy ? ?INITIAL HISTORY: ?Todd Dunn is a 81 y.o. male with medical history significant of indwelling suprapubic urinary catheter secondary to severe BPH, CAD, PVD status post right AKA, SSS status post ICD, HTN, CVA with chronic aphasia, presented with worsening of suprapubic abdominal pain and mentation changes.  ? ? ? ?HOSPITAL COURSE:  ? ?ESBL UTI due to suprapubic catheter ?-Outpatient urine culture growing ESBL.  Patient started on meropenem.  Treated for 7 days.  Symptoms have improved.   ?  ?Acute metabolic encephalopathy ?Apparently according to granddaughter at baseline patient is awake alert appropriate.  No history of dementia. He is with significant aphasia which sometimes can be mistakenly taken as confusion. ?His alteration in mental status is thought to be secondary to UTI. ?Mentation has significantly improved.  Has not required Haldol in more than 24 hours.  Continue with Seroquel. ?  ?Hypokalemia ?Repleted. ?  ?Severe dehydration ?Improved with IV fluids.  He is now tolerating a diet. ?  ?Malfunction of suprapubic urine catheter ?Suprapubic catheter replaced in ED. follow-up with urology as  needed.  Suprapubic catheter care. ?  ?Essential hypertension ?Continue amlodipine ?  ?PAD ?-Continue Plavix ?- S/P Right AKA ?  ?Chronic combined systolic and diastolic CHF ?S/P AICD. Echocardiogram from October 2022 showed EF of 30 to 35% with grade 1 diastolic dysfunction.  Patient is reasonably well compensated.  Not noted to be on goal-directed medical treatments.  Reason for this not entirely clear.  Looks like he was on lisinopril last year but was discontinued for unclear reason.  Will defer to outpatient providers.  Will need to follow-up with cardiology eventually. ?  ?History of CVA with residual dysarthria, dysphagia ?-Continue with Plavix ?-Unable to take statin due to muscle pain as discussed with family ?  ?SSS ?- S/P AICD ?  ?Stage III sacral decubitus ?Pressure Injury 09/26/21 Sacrum Stage 3 -  Full thickness tissue loss. Subcutaneous fat may be visible but bone, tendon or muscle are NOT exposed. (Active)  ?09/26/21 1800  ?Location: Sacrum  ?Location Orientation:   ?Staging: Stage 3 -  Full thickness tissue loss. Subcutaneous fat may be visible but bone, tendon or muscle are NOT exposed.  ?Wound Description (Comments):   ?Present on Admission: Yes  ? ? ?Patient is stable.  Okay for discharge to SNF. ? ? ?PERTINENT LABS: ? ?The results of significant diagnostics from this hospitalization (including imaging, microbiology, ancillary and laboratory) are listed below for reference.   ? ?Microbiology: ?Recent Results (from the past 240 hour(s))  ?Urine Culture     Status: Abnormal  ? Collection Time: 09/24/21 10:25 AM  ? Specimen: Urine, Clean Catch  ?Result Value Ref Range Status  ? Specimen Description URINE, CLEAN CATCH  Final  ? Special Requests  Final  ?  NONE ?Performed at [facility], [place] [address] [place], [place] [address] ?  ? Culture (A)  Final  ?  >=100,000 COLONIES/mL ESCHERICHIA COLI ?Confirmed Extended Spectrum Beta-Lactamase Producer (ESBL).  In bloodstream infections from ESBL  organisms, carbapenems are preferred over piperacillin/tazobactam. They are shown to have a lower risk of mortality. ?  ? Report Status 09/26/2021 FINAL  Final  ? Organism ID, Bacteria ESCHERICHIA COLI (A)  Final  ?    Susceptibility  ? Escherichia coli - MIC*  ?  AMPICILLIN >=32 RESISTANT Resistant   ?  CEFAZOLIN >=64 RESISTANT Resistant   ?  CEFEPIME 16 RESISTANT Resistant   ?  CEFTRIAXONE >=64 RESISTANT Resistant   ?  CIPROFLOXACIN >=4 RESISTANT Resistant   ?  GENTAMICIN >=16 RESISTANT Resistant   ?  IMIPENEM <=0.25 SENSITIVE Sensitive   ?  NITROFURANTOIN <=16 SENSITIVE Sensitive   ?  TRIMETH/SULFA >=320 RESISTANT Resistant   ?  AMPICILLIN/SULBACTAM >=32 RESISTANT Resistant   ?  PIP/TAZO >=128 RESISTANT Resistant   ?  * >=100,000 COLONIES/mL ESCHERICHIA COLI  ?  ? ?Labs: ? ?COVID-19 Labs ? ? ?Lab Results  ?Component Value Date  ? [place] NEGATIVE 08/12/2021  ? [place] NEGATIVE 08/06/2021  ? [place] NEGATIVE 08/04/2021  ? [place] NEGATIVE 07/29/2021  ? ? ? ? ?Basic Metabolic Panel: ?Recent Labs  ?Lab 09/26/21 ?1858 09/28/21 ?0406 09/29/21 ?5681 09/30/21 ?2751 10/02/21 ?0401  ?NA  --  139 136  --  139  ?K  --  3.6 4.3  --  3.9  ?CL  --  105 105  --  105  ?CO2  --  29 25  --  30  ?GLUCOSE  --  106* 131*  --  96  ?BUN  --  10 9  --  17  ?CREATININE  --  0.60* 0.54*  --  0.55*  ?CALCIUM  --  9.3 9.6  --  10.3  ?MG 2.6* 2.1  --   --   --   ?PHOS 2.6 2.2* 2.6 2.4*  --   ? ?Liver Function Tests: ?No results for input(s): AST, ALT, ALKPHOS, BILITOT, PROT, ALBUMIN in the last 168 hours. ? ?No results for input(s): LIPASE, AMYLASE in the last 168 hours. ? ?CBC: ?Recent Labs  ?Lab 09/28/21 ?0406 09/29/21 ?0156 10/02/21 ?0401  ?WBC 5.4 6.1 3.8*  ?HGB 11.6* 12.7* 12.3*  ?HCT 37.1* 39.2 38.5*  ?MCV 89.4 86.7 87.5  ?PLT 156 181 191  ? ? ? ?IMAGING STUDIES ?DG Chest Portable 1 View ? ?Result Date: 09/26/2021 ?CLINICAL DATA:  Screening.  Suprapubic catheter fell out. EXAM: PORTABLE CHEST 1 VIEW COMPARISON:  AP chest  08/11/2021 FINDINGS: Status post median sternotomy. Left chest wall cardiac AICD with leads again overlying the right atrium and right ventricle. Cardiac silhouette is at the upper limits of normal size. Mediastinal contours are within normal limits. Mild-to-moderate calcification within the aortic arch. The lungs are clear. No pleural effusion or pneumothorax. Degenerative bridging osteophytes of the thoracic spine. IMPRESSION: No acute cardiopulmonary disease process. Electronically Signed   By: Yvonne Kendall M.D.   On: 09/26/2021 11:53   ? ?DISCHARGE EXAMINATION: ?Vitals:  ? 10/02/21 1748 10/02/21 2103 10/03/21 7001 10/03/21 0845  ?BP: 121/73 126/76 (!) 105/49 112/61  ?Pulse: 61 70 (!) 52 62  ?Resp: '19 18 18 18  '$ ?Temp: 98.2 ?F (36.8 ?C) 98 ?F (36.7 ?C) 98.2 ?F (36.8 ?C) 98 ?F (36.7 ?C)  ?TempSrc:  Oral  Oral  ?SpO2:  99% 98% 99% 99%  ?Weight:      ?Height:      ? ?General appearance: Awake alert.  In no distress ?Resp: Clear to auscultation bilaterally.  Normal effort ?Cardio: S1-S2 is normal regular.  No S3-S4.  No rubs murmurs or bruit ?GI: Abdomen is soft.  Nontender nondistended.  Bowel sounds are present normal.  No masses organomegaly ? ? ?DISPOSITION: SNF ? ?Discharge Instructions   ? ? Call MD for:  difficulty breathing, headache or visual disturbances   Complete by: As directed ?  ? Call MD for:  extreme fatigue   Complete by: As directed ?  ? Call MD for:  persistant dizziness or light-headedness   Complete by: As directed ?  ? Call MD for:  persistant nausea and vomiting   Complete by: As directed ?  ? Call MD for:  severe uncontrolled pain   Complete by: As directed ?  ? Call MD for:  temperature >100.4   Complete by: As directed ?  ? Discharge instructions   Complete by: As directed ?  ? Please review instructions on the discharge summary. ? ?You were cared for by a hospitalist during your hospital stay. If you have any questions about your discharge medications or the care you received while you were  in the hospital after you are discharged, you can call the unit and asked to speak with the hospitalist on call if the hospitalist that took care of you is not available. Once you are discharged, your

## 2021-10-03 NOTE — Progress Notes (Addendum)
[employer] auth for [facility] received: [id number], reference [id number], valid 4/29-5/2. Unable to reach [place] in admissions at [facility] after multiple calls and texts. Spoke to [employer] at [facility] who confirmed they are prepared to admit pt today. RN called report to [place] who repots they are able to accept after pt has a BM.  ? ?Wandra Feinstein, MSW, LCSW ?[phone] (coverage) ? ? ?[id number]: spoke to [place] in admissions who reports she was not made aware of weekend admission by weekday SW but agreed to accept pt today. [employer] auth details provided to [place].  ? ?Wandra Feinstein, MSW, LCSW ?[phone] (coverage) ? ? ? ? ?

## 2021-10-03 NOTE — Progress Notes (Signed)
Granddaughter, [name], made aware that patient's transport should be soon as [facility] notified staff of 15 minute arrival. Granddaughter plans to meet patient at [facility].  ?

## 2021-10-05 ENCOUNTER — Other Ambulatory Visit: Payer: Self-pay | Admitting: Nurse Practitioner

## 2021-10-05 DIAGNOSIS — I5022 Chronic systolic (congestive) heart failure: Secondary | ICD-10-CM

## 2021-10-09 ENCOUNTER — Telehealth: Payer: Self-pay | Admitting: Nurse Practitioner

## 2021-10-09 NOTE — Telephone Encounter (Signed)
Cathy from [facility] home health called regarding two fax requests that she will be refaxing today. ? ?Best contact: [phone] ?

## 2021-10-12 ENCOUNTER — Emergency Department (HOSPITAL_COMMUNITY): Payer: Medicare Other

## 2021-10-12 ENCOUNTER — Other Ambulatory Visit: Payer: Self-pay

## 2021-10-12 ENCOUNTER — Encounter (HOSPITAL_COMMUNITY): Payer: Self-pay

## 2021-10-12 ENCOUNTER — Inpatient Hospital Stay (HOSPITAL_COMMUNITY)
Admission: EM | Admit: 2021-10-12 | Discharge: 2021-10-19 | DRG: 698 | Disposition: A | Discharge status: Home Health | Payer: Medicare Other | Attending: Student | Admitting: Student

## 2021-10-12 DIAGNOSIS — R4189 Other symptoms and signs involving cognitive functions and awareness: Secondary | ICD-10-CM

## 2021-10-12 DIAGNOSIS — Z8679 Personal history of other diseases of the circulatory system: Secondary | ICD-10-CM

## 2021-10-12 DIAGNOSIS — J441 Chronic obstructive pulmonary disease with (acute) exacerbation: Principal | ICD-10-CM

## 2021-10-12 DIAGNOSIS — Z89611 Acquired absence of right leg above knee: Secondary | ICD-10-CM

## 2021-10-12 DIAGNOSIS — I5022 Chronic systolic (congestive) heart failure: Secondary | ICD-10-CM | POA: Diagnosis present

## 2021-10-12 DIAGNOSIS — E559 Vitamin D deficiency, unspecified: Secondary | ICD-10-CM | POA: Diagnosis present

## 2021-10-12 DIAGNOSIS — I739 Peripheral vascular disease, unspecified: Secondary | ICD-10-CM | POA: Diagnosis present

## 2021-10-12 DIAGNOSIS — I5042 Chronic combined systolic (congestive) and diastolic (congestive) heart failure: Secondary | ICD-10-CM | POA: Diagnosis present

## 2021-10-12 DIAGNOSIS — Z79899 Other long term (current) drug therapy: Secondary | ICD-10-CM

## 2021-10-12 DIAGNOSIS — Z87440 Personal history of urinary (tract) infections: Secondary | ICD-10-CM

## 2021-10-12 DIAGNOSIS — F05 Delirium due to known physiological condition: Secondary | ICD-10-CM | POA: Diagnosis present

## 2021-10-12 DIAGNOSIS — I69320 Aphasia following cerebral infarction: Secondary | ICD-10-CM

## 2021-10-12 DIAGNOSIS — I2510 Atherosclerotic heart disease of native coronary artery without angina pectoris: Secondary | ICD-10-CM | POA: Diagnosis present

## 2021-10-12 DIAGNOSIS — Z7902 Long term (current) use of antithrombotics/antiplatelets: Secondary | ICD-10-CM

## 2021-10-12 DIAGNOSIS — Z20822 Contact with and (suspected) exposure to covid-19: Secondary | ICD-10-CM | POA: Diagnosis present

## 2021-10-12 DIAGNOSIS — J9601 Acute respiratory failure with hypoxia: Secondary | ICD-10-CM | POA: Diagnosis present

## 2021-10-12 DIAGNOSIS — N390 Urinary tract infection, site not specified: Secondary | ICD-10-CM | POA: Diagnosis present

## 2021-10-12 DIAGNOSIS — B965 Pseudomonas (aeruginosa) (mallei) (pseudomallei) as the cause of diseases classified elsewhere: Secondary | ICD-10-CM | POA: Diagnosis present

## 2021-10-12 DIAGNOSIS — N400 Enlarged prostate without lower urinary tract symptoms: Secondary | ICD-10-CM | POA: Diagnosis not present

## 2021-10-12 DIAGNOSIS — Z2239 Carrier of other specified bacterial diseases: Secondary | ICD-10-CM

## 2021-10-12 DIAGNOSIS — I495 Sick sinus syndrome: Secondary | ICD-10-CM | POA: Diagnosis present

## 2021-10-12 DIAGNOSIS — Z7189 Other specified counseling: Secondary | ICD-10-CM

## 2021-10-12 DIAGNOSIS — Z880 Allergy status to penicillin: Secondary | ICD-10-CM

## 2021-10-12 DIAGNOSIS — Y846 Urinary catheterization as the cause of abnormal reaction of the patient, or of later complication, without mention of misadventure at the time of the procedure: Secondary | ICD-10-CM | POA: Diagnosis present

## 2021-10-12 DIAGNOSIS — Z886 Allergy status to analgesic agent status: Secondary | ICD-10-CM

## 2021-10-12 DIAGNOSIS — Z8673 Personal history of transient ischemic attack (TIA), and cerebral infarction without residual deficits: Secondary | ICD-10-CM

## 2021-10-12 DIAGNOSIS — B9620 Unspecified Escherichia coli [E. coli] as the cause of diseases classified elsewhere: Secondary | ICD-10-CM | POA: Diagnosis present

## 2021-10-12 DIAGNOSIS — Z950 Presence of cardiac pacemaker: Secondary | ICD-10-CM | POA: Diagnosis present

## 2021-10-12 DIAGNOSIS — R410 Disorientation, unspecified: Secondary | ICD-10-CM | POA: Insufficient documentation

## 2021-10-12 DIAGNOSIS — E43 Unspecified severe protein-calorie malnutrition: Secondary | ICD-10-CM | POA: Diagnosis present

## 2021-10-12 DIAGNOSIS — E785 Hyperlipidemia, unspecified: Secondary | ICD-10-CM | POA: Diagnosis present

## 2021-10-12 DIAGNOSIS — Z8619 Personal history of other infectious and parasitic diseases: Secondary | ICD-10-CM | POA: Diagnosis present

## 2021-10-12 DIAGNOSIS — Z87891 Personal history of nicotine dependence: Secondary | ICD-10-CM

## 2021-10-12 DIAGNOSIS — Z681 Body mass index (BMI) 19 or less, adult: Secondary | ICD-10-CM

## 2021-10-12 DIAGNOSIS — L89153 Pressure ulcer of sacral region, stage 3: Secondary | ICD-10-CM | POA: Diagnosis present

## 2021-10-12 DIAGNOSIS — T83518A Infection and inflammatory reaction due to other urinary catheter, initial encounter: Principal | ICD-10-CM | POA: Diagnosis present

## 2021-10-12 DIAGNOSIS — N319 Neuromuscular dysfunction of bladder, unspecified: Secondary | ICD-10-CM | POA: Diagnosis present

## 2021-10-12 DIAGNOSIS — Z978 Presence of other specified devices: Secondary | ICD-10-CM

## 2021-10-12 DIAGNOSIS — Z95810 Presence of automatic (implantable) cardiac defibrillator: Secondary | ICD-10-CM

## 2021-10-12 DIAGNOSIS — N3001 Acute cystitis with hematuria: Secondary | ICD-10-CM

## 2021-10-12 DIAGNOSIS — D649 Anemia, unspecified: Secondary | ICD-10-CM | POA: Diagnosis present

## 2021-10-12 DIAGNOSIS — I110 Hypertensive heart disease with heart failure: Secondary | ICD-10-CM | POA: Diagnosis present

## 2021-10-12 DIAGNOSIS — Z888 Allergy status to other drugs, medicaments and biological substances status: Secondary | ICD-10-CM

## 2021-10-12 DIAGNOSIS — N401 Enlarged prostate with lower urinary tract symptoms: Secondary | ICD-10-CM | POA: Diagnosis present

## 2021-10-12 DIAGNOSIS — F0390 Unspecified dementia without behavioral disturbance: Secondary | ICD-10-CM | POA: Diagnosis present

## 2021-10-12 DIAGNOSIS — Z951 Presence of aortocoronary bypass graft: Secondary | ICD-10-CM

## 2021-10-12 DIAGNOSIS — I10 Essential (primary) hypertension: Secondary | ICD-10-CM | POA: Diagnosis present

## 2021-10-12 DIAGNOSIS — E876 Hypokalemia: Secondary | ICD-10-CM | POA: Diagnosis present

## 2021-10-12 DIAGNOSIS — K219 Gastro-esophageal reflux disease without esophagitis: Secondary | ICD-10-CM | POA: Diagnosis present

## 2021-10-12 LAB — BLOOD GAS, VENOUS
Acid-base deficit: 5.1 mmol/L — ABNORMAL HIGH (ref 0.0–2.0)
Bicarbonate: 21.2 mmol/L (ref 20.0–28.0)
O2 Saturation: 39.2 %
Patient temperature: 98.7
RATE: 2 resp/min
pCO2, Ven: 43 mmHg — ABNORMAL LOW (ref 44–60)
pH, Ven: 7.3 (ref 7.25–7.43)
pO2, Ven: <31 mmHg — CL (ref 32–45)

## 2021-10-12 LAB — BASIC METABOLIC PANEL
Anion gap: 6 (ref 5–15)
BUN: 15 mg/dL (ref 8–23)
CO2: 27 mmol/L (ref 22–32)
Calcium: 10 mg/dL (ref 8.9–10.3)
Chloride: 105 mmol/L (ref 98–111)
Creatinine, Ser: 0.73 mg/dL (ref 0.61–1.24)
GFR, Estimated: >60 mL/min (ref >60)
Glucose, Bld: 117 mg/dL — ABNORMAL HIGH (ref 70–99)
Potassium: 3.2 mmol/L — ABNORMAL LOW (ref 3.5–5.1)
Sodium: 138 mmol/L (ref 135–145)

## 2021-10-12 LAB — CBC
HCT: 41.8 % (ref 39.0–52.0)
Hemoglobin: 13.3 g/dL (ref 13.0–17.0)
MCH: 28.9 pg (ref 26.0–34.0)
MCHC: 31.8 g/dL (ref 30.0–36.0)
MCV: 90.7 fL (ref 80.0–100.0)
Platelets: 207 10*3/uL (ref 150–400)
RBC: 4.61 MIL/uL (ref 4.22–5.81)
RDW: 15.9 % — ABNORMAL HIGH (ref 11.5–15.5)
WBC: 5.1 10*3/uL (ref 4.0–10.5)
nRBC: 0 % (ref 0.0–0.2)

## 2021-10-12 LAB — URINALYSIS, ROUTINE W REFLEX MICROSCOPIC
Bilirubin Urine: NEGATIVE
Glucose, UA: NEGATIVE mg/dL
Ketones, ur: NEGATIVE mg/dL
Nitrite: POSITIVE — AB
Protein, ur: 100 mg/dL — AB
RBC / HPF: >50 RBC/hpf — ABNORMAL HIGH (ref 0–5)
Specific Gravity, Urine: 1.005 (ref 1.005–1.030)
WBC, UA: >50 WBC/hpf — ABNORMAL HIGH (ref 0–5)
pH: 6 (ref 5.0–8.0)

## 2021-10-12 LAB — BRAIN NATRIURETIC PEPTIDE: B Natriuretic Peptide: 259.1 pg/mL — ABNORMAL HIGH (ref 0.0–100.0)

## 2021-10-12 LAB — TROPONIN I (HIGH SENSITIVITY): Troponin I (High Sensitivity): 17 ng/L (ref <18)

## 2021-10-12 LAB — MAGNESIUM: Magnesium: 2 mg/dL (ref 1.7–2.4)

## 2021-10-12 MED ORDER — PANTOPRAZOLE SODIUM 40 MG PO TBEC
40.0000 mg | DELAYED_RELEASE_TABLET | Freq: Every day | ORAL | Status: DC
  Administered 2021-10-13 – 2021-10-19 (×7): 40 mg via ORAL
  Filled 2021-10-12 (×8): qty 1

## 2021-10-12 MED ORDER — IPRATROPIUM-ALBUTEROL 0.5-2.5 (3) MG/3ML IN SOLN
3.0000 mL | Freq: Three times a day (TID) | RESPIRATORY_TRACT | Status: DC
  Administered 2021-10-13 – 2021-10-14 (×4): 3 mL via RESPIRATORY_TRACT
  Filled 2021-10-12 (×4): qty 3

## 2021-10-12 MED ORDER — SODIUM CHLORIDE 0.9 % IV SOLN
2.0000 g | Freq: Once | INTRAVENOUS | Status: AC
  Administered 2021-10-12: 2 g via INTRAVENOUS
  Filled 2021-10-12: qty 20

## 2021-10-12 MED ORDER — ACETAMINOPHEN 325 MG PO TABS
650.0000 mg | ORAL_TABLET | Freq: Four times a day (QID) | ORAL | Status: DC | PRN
  Administered 2021-10-18 – 2021-10-19 (×4): 650 mg via ORAL
  Filled 2021-10-12 (×4): qty 2

## 2021-10-12 MED ORDER — FUROSEMIDE 10 MG/ML IJ SOLN: 40.0000 mg | Freq: Every day | INTRAMUSCULAR | Status: DC

## 2021-10-12 MED ORDER — QUETIAPINE FUMARATE 25 MG PO TABS
50.0000 mg | ORAL_TABLET | Freq: Two times a day (BID) | ORAL | Status: DC
  Administered 2021-10-13 – 2021-10-19 (×13): 50 mg via ORAL
  Filled 2021-10-12 (×13): qty 2

## 2021-10-12 MED ORDER — ENOXAPARIN SODIUM 40 MG/0.4ML IJ SOSY
40.0000 mg | PREFILLED_SYRINGE | INTRAMUSCULAR | Status: DC
  Administered 2021-10-13 – 2021-10-14 (×2): 40 mg via SUBCUTANEOUS
  Filled 2021-10-12 (×3): qty 0.4

## 2021-10-12 MED ORDER — IPRATROPIUM-ALBUTEROL 0.5-2.5 (3) MG/3ML IN SOLN
3.0000 mL | Freq: Four times a day (QID) | RESPIRATORY_TRACT | Status: DC
  Administered 2021-10-12: 3 mL via RESPIRATORY_TRACT
  Filled 2021-10-12: qty 3

## 2021-10-12 MED ORDER — ALBUTEROL SULFATE HFA 108 (90 BASE) MCG/ACT IN AERS: 2.0000 | INHALATION_SPRAY | Freq: Four times a day (QID) | RESPIRATORY_TRACT | Status: DC | PRN

## 2021-10-12 MED ORDER — POTASSIUM CHLORIDE 20 MEQ PO PACK
40.0000 meq | PACK | Freq: Two times a day (BID) | ORAL | Status: DC
  Administered 2021-10-13 – 2021-10-19 (×13): 40 meq via ORAL
  Filled 2021-10-12 (×13): qty 2

## 2021-10-12 MED ORDER — SODIUM CHLORIDE 0.9% FLUSH
3.0000 mL | Freq: Two times a day (BID) | INTRAVENOUS | Status: DC
  Administered 2021-10-13 – 2021-10-19 (×11): 3 mL via INTRAVENOUS

## 2021-10-12 MED ORDER — ENSURE ENLIVE PO LIQD
237.0000 mL | Freq: Three times a day (TID) | ORAL | Status: DC
  Administered 2021-10-13 – 2021-10-19 (×20): 237 mL via ORAL
  Filled 2021-10-12 (×2): qty 237

## 2021-10-12 MED ORDER — ACETAMINOPHEN 650 MG RE SUPP: 650.0000 mg | Freq: Four times a day (QID) | RECTAL | Status: DC | PRN

## 2021-10-12 MED ORDER — ALBUTEROL SULFATE (2.5 MG/3ML) 0.083% IN NEBU: 2.5000 mg | INHALATION_SOLUTION | RESPIRATORY_TRACT | Status: DC | PRN

## 2021-10-12 MED ORDER — POLYETHYLENE GLYCOL 3350 17 G PO PACK: 17.0000 g | PACK | Freq: Every day | ORAL | Status: DC | PRN

## 2021-10-12 MED ORDER — AMLODIPINE BESYLATE 10 MG PO TABS
10.0000 mg | ORAL_TABLET | Freq: Every day | ORAL | Status: DC
  Administered 2021-10-13 – 2021-10-15 (×3): 10 mg via ORAL
  Filled 2021-10-12 (×3): qty 1

## 2021-10-12 MED ORDER — IOHEXOL 350 MG/ML SOLN
80.0000 mL | Freq: Once | INTRAVENOUS | Status: AC | PRN
  Administered 2021-10-12: 80 mL via INTRAVENOUS

## 2021-10-12 MED ORDER — DIVALPROEX SODIUM 125 MG PO DR TAB
125.0000 mg | DELAYED_RELEASE_TABLET | Freq: Every day | ORAL | Status: DC
  Administered 2021-10-13 – 2021-10-18 (×6): 125 mg via ORAL
  Filled 2021-10-12 (×8): qty 1

## 2021-10-12 MED ORDER — OXYBUTYNIN CHLORIDE 5 MG PO TABS
5.0000 mg | ORAL_TABLET | Freq: Three times a day (TID) | ORAL | Status: DC | PRN
  Administered 2021-10-13: 5 mg via ORAL
  Filled 2021-10-12 (×2): qty 1

## 2021-10-12 NOTE — H&P (Signed)
?History and Physical  ? ?Todd Dunn XLK:[id number] DOB: 08/08/1940 DOA: 10/12/2021 ? ?PCP: [name], NP  ? ?Patient coming from: Home ? ?Chief Complaint: Shortness of breath ? ?HPI: Todd Dunn is a 81 y.o. male with medical history significant of dementia, chronic Foley, history of ESBL UTI, hypertension, anemia, sick sinus syndrome status post pacemaker, hyperlipidemia, PAD, CAD status post CABG, CVA with chronic aphasia, BPH, CHF, GERD presenting with shortness of breath. ? ?History obtained with assistance of chart review and family as patient unable to provide significant history due to significant aphasia.  Patient presenting with shortness of breath, was saturating in the 70s on room air per EMS. Was released from rehab today. Received nebulizer treatments in route with improvement. ? ?Family states patient reporting SOB suprapubic pain at the site catheter. Unable to obtain full review of systems due to patient's aphasia. ? ?ED Course: Vital signs in the ED significant only for desaturation on room air.  Saturating well on 2 L.  Lab work-up included BMP with potassium 3.2, glucose 117.  CBC within normal limits.  Troponin negative.  BNP elevated to 54.  Urinalysis with hemoglobin, nitrates, large leukocytes, many bacteria.  Urine cultures pending.  Chest x-ray showing cardiomegaly no acute disease.  CT PE study negative for PE, status post CABG, cardiomegaly.  Patient received ceftriaxone in the ED. ? ?Review of Systems: Unable to obtain full review of systems due to patient's aphasia. ? ?Past Medical History:  ?Diagnosis Date  ? BPH (benign prostatic hyperplasia)   ? CAD (coronary artery disease)   ? Chronic indwelling Foley catheter   ? CVA (cerebral vascular accident) [facility])   ? GERD (gastroesophageal reflux disease)   ? HTN (hypertension)   ? Neurogenic bladder   ? SSS (sick sinus syndrome) ([place])   ? s/p MDT ICD  ? Vitamin D deficiency   ? ? ?Past Surgical History:  ?Procedure  Laterality Date  ? ABDOMINAL AORTOGRAM W/LOWER EXTREMITY N/A 03/23/2021  ? Procedure: ABDOMINAL AORTOGRAM W/LOWER EXTREMITY;  Surgeon: Waynetta Sandy, MD;  Location: [facility];  Service: Cardiovascular;  Laterality: N/A;  ? AMPUTATION Right 04/01/2021  ? Procedure: RIGHT GREAT TOE AMPUTATION;  Surgeon: Newt Minion, MD;  Location: [facility];  Service: Orthopedics;  Laterality: Right;  ? AMPUTATION Right 07/30/2021  ? Procedure: AMPUTATION ABOVE KNEE;  Surgeon: Broadus John, MD;  Location: [facility];  Service: Vascular;  Laterality: Right;  ? APPENDECTOMY    ? APPLICATION OF WOUND VAC Right 04/01/2021  ? Procedure: APPLICATION OF WOUND VAC;  Surgeon: Newt Minion, MD;  Location: [facility];  Service: Orthopedics;  Laterality: Right;  ? CARDIAC SURGERY    ? FEMORAL-TIBIAL BYPASS GRAFT Right 03/26/2021  ? Procedure: BYPASS GRAFT FEMORAL-TIBIAL ARTERY.  [place] right leg saphenous vein, right vein patch angioplasty posterior tibial.;  Surgeon: Marty Heck, MD;  Location: [facility];  Service: Vascular;  Laterality: Right;  ? IR US GUIDE BX ASP/DRAIN  08/03/2021  ? IR US GUIDE BX ASP/DRAIN  08/06/2021  ? PACEMAKER PLACEMENT    ? ? ?Social History ? reports that he has quit smoking. His smoking use included cigarettes. He has never been exposed to tobacco smoke. He has never used smokeless tobacco. He reports that he does not currently use alcohol. He reports that he does not use drugs. ? ?Allergies  ?Allergen Reactions  ? Food Anaphylaxis and Rash  ?  Clams  ? Aspirin Itching  ? Crestor [employer]  Calcium] Other (See Comments)  ?  Muscle pain  ? [place] Clam (M. [place]enaria) Skin Test Itching  ? Penicillins Itching  ? ? ?Family History  ?Family history unknown: Yes  ? ? ?Prior to Admission medications   ?Medication Sig Start Date End Date Taking? Authorizing Provider  ?acidophilus (RISAQUAD) CAPS capsule Take 2 capsules by mouth daily. 10/04/21   Bonnielee Haff, MD  ?albuterol (VENTOLIN HFA) 108  (90 Base) MCG/ACT inhaler Inhale 2 puffs into the lungs every 6 (six) hours as needed for wheezing or shortness of breath. 06/24/21   Terrilee Croak, MD  ?amLODipine (NORVASC) 10 MG tablet Take 1 tablet (10 mg total) by mouth daily. 07/17/21   [name], NP  ?clopidogrel (PLAVIX) 75 MG tablet Take 1 tablet (75 mg total) by mouth at bedtime. 07/17/21   [name], NP  ?divalproex (DEPAKOTE) 125 MG DR tablet Take 1 tablet (125 mg total) by mouth at bedtime. 07/17/21 10/15/21  [name], NP  ?docusate sodium (COLACE) 100 MG capsule Take 100 mg by mouth daily.    [provider]  ?feeding supplement (ENSURE ENLIVE / ENSURE PLUS) LIQD Take 237 mLs by mouth 3 (three) times daily between meals. 10/03/21   Bonnielee Haff, MD  ?HYDROcodone-acetaminophen (NORCO) 5-325 MG tablet Take 1 tablet by mouth every 6 (six) hours as needed for moderate pain. 10/03/21   Bonnielee Haff, MD  ?nitroGLYCERIN (NITROSTAT) 0.4 MG SL tablet Place 0.4 mg under the tongue every 5 (five) minutes x 3 doses as needed for chest pain.    [provider]  ?oxybutynin (DITROPAN) 5 MG tablet Take 1 tablet (5 mg total) by mouth every 8 (eight) hours as needed for bladder spasms. 08/29/21   Blanchie Dessert, MD  ?pantoprazole (PROTONIX) 40 MG tablet TAKE 1 TABLET(40 MG) BY MOUTH DAILY ?Patient taking differently: Take 40 mg by mouth daily. 07/21/21   Charlott Rakes, MD  ?polyethylene glycol (MIRALAX / GLYCOLAX) 17 g packet Take 17 g by mouth daily as needed for mild constipation. 07/17/21   [name], NP  ?polyethylene glycol (MIRALAX / GLYCOLAX) 17 g packet Take 17 g by mouth daily. 10/04/21   Bonnielee Haff, MD  ?QUEtiapine (SEROQUEL) 50 MG tablet Take 1 tablet (50 mg total) by mouth 2 (two) times daily. 10/03/21   Bonnielee Haff, MD  ?senna-docusate (SENOKOT-S) 8.6-50 MG tablet Take 2 tablets by mouth 2 (two) times daily. 10/03/21   Bonnielee Haff, MD  ?vitamin B-12 (CYANOCOBALAMIN) 1000 MCG tablet Take 1 tablet  (1,000 mcg total) by mouth daily. 07/17/21   [name], NP  ?Vitamin D, Ergocalciferol, (DRISDOL) 1.25 MG (50000 UNIT) CAPS capsule Take 50,000 Units by mouth every Monday.    [provider]  ? ? ?Physical Exam: ?Vitals:  ? 10/12/21 2006 10/12/21 2030 10/12/21 2100 10/12/21 2130  ?BP:  (!) 138/58 138/62 134/80  ?Pulse:  73 74 75  ?Resp:  (!) 21 (!) 25 18  ?Temp:      ?SpO2:  100% 100% 100%  ?Weight: 59 kg     ?Height: 6' (1.829 m)     ? ? ?Physical Exam ?Constitutional:   ?   General: He is not in acute distress. ?   Comments: Thin appearing elderly male  ?HENT:  ?   Head: Normocephalic and atraumatic.  ?   Mouth/Throat:  ?   Mouth: Mucous membranes are moist.  ?   Pharynx: Oropharynx is clear.  ?Eyes:  ?   Extraocular  Movements: Extraocular movements intact.  ?   Pupils: Pupils are equal, round, and reactive to light.  ?Cardiovascular:  ?   Rate and Rhythm: Normal rate and regular rhythm.  ?   Pulses: Normal pulses.  ?   Heart sounds: Normal heart sounds.  ?Pulmonary:  ?   Effort: Pulmonary effort is normal. No respiratory distress.  ?   Breath sounds: Normal breath sounds.  ?Abdominal:  ?   General: Bowel sounds are normal. There is no distension.  ?   Palpations: Abdomen is soft.  ?   Tenderness: There is no abdominal tenderness.  ?Musculoskeletal:     ?   General: No swelling or deformity.  ?   Comments: Status post right AKA.  ?Skin: ?   General: Skin is warm and dry.  ?Neurological:  ?   General: No focal deficit present.  ?   Mental Status: Mental status is at baseline.  ? ?Labs on Admission: I have personally reviewed following labs and imaging studies ? ?CBC: ?Recent Labs  ?Lab 10/12/21 ?2020  ?WBC 5.1  ?HGB 13.3  ?HCT 41.8  ?MCV 90.7  ?PLT 207  ? ? ?Basic Metabolic Panel: ?Recent Labs  ?Lab 10/12/21 ?2020  ?NA 138  ?K 3.2*  ?CL 105  ?CO2 27  ?GLUCOSE 117*  ?BUN 15  ?CREATININE 0.73  ?CALCIUM 10.0  ? ? ?GFR: ?Estimated Creatinine Clearance: 59.4 mL/min (by C-G formula based on SCr of 0.73  mg/dL). ? ?Liver Function Tests: ?No results for input(s): AST, ALT, ALKPHOS, BILITOT, PROT, ALBUMIN in the last 168 hours. ? ?Urine analysis: ?   ?Component Value Date/Time  ? COLORURINE YELLOW 10/12/2021 2047

## 2021-10-12 NOTE — ED Triage Notes (Signed)
BIB [facility] with c/o shortness of breath at home, 72% on RA. Hx of COPD, CHF, asthma. Arrives on albuterol neb.  ? ?Also possibly might have UTI.  ? ?Hx of dementia.  ?

## 2021-10-12 NOTE — Telephone Encounter (Signed)
Form has been received and paperwork will be faxed once PCP signs off. ?

## 2021-10-12 NOTE — ED Provider Notes (Signed)
?[facility] DEPT ?Provider Note ? ? ?CSN: [id number] ?Arrival date & time: 10/12/21  1955 ? ?  ? ?History ? ?Chief Complaint  ?Patient presents with  ? Shortness of Breath  ? ? ?Todd Dunn is a 81 y.o. male. ? ?81 yo M with a cc of sob.  This was all reported by EMS.  Patient is unable to provide any history.  Was 72% on room air per EMS.  Not on oxygen at home.  Given breathing treatments with reported improvement. ? ? ?Shortness of Breath ? ?  ? ?Home Medications ?Prior to Admission medications   ?Medication Sig Start Date End Date Taking? Authorizing Provider  ?acidophilus (RISAQUAD) CAPS capsule Take 2 capsules by mouth daily. 10/04/21   Bonnielee Haff, MD  ?albuterol (VENTOLIN HFA) 108 (90 Base) MCG/ACT inhaler Inhale 2 puffs into the lungs every 6 (six) hours as needed for wheezing or shortness of breath. 06/24/21   Terrilee Croak, MD  ?amLODipine (NORVASC) 10 MG tablet Take 1 tablet (10 mg total) by mouth daily. 07/17/21   [name], NP  ?clopidogrel (PLAVIX) 75 MG tablet Take 1 tablet (75 mg total) by mouth at bedtime. 07/17/21   [name], NP  ?divalproex (DEPAKOTE) 125 MG DR tablet Take 1 tablet (125 mg total) by mouth at bedtime. 07/17/21 10/15/21  [name], NP  ?docusate sodium (COLACE) 100 MG capsule Take 100 mg by mouth daily.    [provider]  ?feeding supplement (ENSURE ENLIVE / ENSURE PLUS) LIQD Take 237 mLs by mouth 3 (three) times daily between meals. 10/03/21   Bonnielee Haff, MD  ?HYDROcodone-acetaminophen (NORCO) 5-325 MG tablet Take 1 tablet by mouth every 6 (six) hours as needed for moderate pain. 10/03/21   Bonnielee Haff, MD  ?nitroGLYCERIN (NITROSTAT) 0.4 MG SL tablet Place 0.4 mg under the tongue every 5 (five) minutes x 3 doses as needed for chest pain.    [provider]  ?oxybutynin (DITROPAN) 5 MG tablet Take 1 tablet (5 mg total) by mouth every 8 (eight) hours as needed for bladder spasms. 08/29/21   Blanchie Dessert, MD  ?pantoprazole (PROTONIX) 40 MG tablet TAKE 1 TABLET(40 MG) BY MOUTH DAILY ?Patient taking differently: Take 40 mg by mouth daily. 07/21/21   Charlott Rakes, MD  ?polyethylene glycol (MIRALAX / GLYCOLAX) 17 g packet Take 17 g by mouth daily as needed for mild constipation. 07/17/21   [name], NP  ?polyethylene glycol (MIRALAX / GLYCOLAX) 17 g packet Take 17 g by mouth daily. 10/04/21   Bonnielee Haff, MD  ?QUEtiapine (SEROQUEL) 50 MG tablet Take 1 tablet (50 mg total) by mouth 2 (two) times daily. 10/03/21   Bonnielee Haff, MD  ?senna-docusate (SENOKOT-S) 8.6-50 MG tablet Take 2 tablets by mouth 2 (two) times daily. 10/03/21   Bonnielee Haff, MD  ?vitamin B-12 (CYANOCOBALAMIN) 1000 MCG tablet Take 1 tablet (1,000 mcg total) by mouth daily. 07/17/21   [name], NP  ?Vitamin D, Ergocalciferol, (DRISDOL) 1.25 MG (50000 UNIT) CAPS capsule Take 50,000 Units by mouth every Monday.    [provider]  ?   ? ?Allergies    ?Food, Aspirin, Crestor [rosuvastatin calcium], [place] qu[place]og clam (m. mercenaria) skin test, and Penicillins   ? ?Review of Systems   ?Review of Systems  ?Respiratory:  Positive for shortness of breath.   ? ?Physical Exam ?Updated Vital Signs ?BP 134/80   Pulse 75   Temp 97.8 ?F (36.6 ?C)  Resp 18   Ht 6' (1.829 m)   Wt 59 kg   SpO2 99%   BMI 17.64 kg/m?  ?Physical Exam ?Vitals and nursing note reviewed.  ?Constitutional:   ?   Appearance: He is well-developed.  ?HENT:  ?   Head: Normocephalic and atraumatic.  ?Eyes:  ?   Pupils: Pupils are equal, round, and reactive to light.  ?Neck:  ?   Vascular: No JVD.  ?Cardiovascular:  ?   Rate and Rhythm: Normal rate and regular rhythm.  ?   Heart sounds: No murmur heard. ?  No friction rub. No gallop.  ?Pulmonary:  ?   Effort: No respiratory distress.  ?   Breath sounds: Decreased breath sounds present. No wheezing.  ?   Comments: No significant tachypnea unable to get the patient to take big breaths for me which  limits exam ?Abdominal:  ?   General: There is no distension.  ?   Tenderness: There is no abdominal tenderness. There is no guarding or rebound.  ?Musculoskeletal:     ?   General: Normal range of motion.  ?   Cervical back: Normal range of motion and neck supple.  ?Skin: ?   Coloration: Skin is not pale.  ?   Findings: No rash.  ?Neurological:  ?   Mental Status: He is alert and oriented to person, place, and time.  ?Psychiatric:     ?   Behavior: Behavior normal.  ? ? ?ED Results / Procedures / Treatments   ?Labs ?(all labs ordered are listed, but only abnormal results are displayed) ?Labs Reviewed  ?BASIC METABOLIC PANEL - Abnormal; Notable for the following components:  ?    Result Value  ? Potassium 3.2 (*)   ? Glucose, Bld 117 (*)   ? All other components within normal limits  ?CBC - Abnormal; Notable for the following components:  ? RDW 15.9 (*)   ? All other components within normal limits  ?BRAIN NATRIURETIC PEPTIDE - Abnormal; Notable for the following components:  ? B Natriuretic Peptide 259.1 (*)   ? All other components within normal limits  ?BLOOD GAS, VENOUS - Abnormal; Notable for the following components:  ? pCO2, Ven 43 (*)   ? pO2, Ven <31 (*)   ? Acid-base deficit 5.1 (*)   ? All other components within normal limits  ?URINALYSIS, ROUTINE W REFLEX MICROSCOPIC - Abnormal; Notable for the following components:  ? APPearance CLOUDY (*)   ? Hgb urine dipstick MODERATE (*)   ? Protein, ur 100 (*)   ? Nitrite POSITIVE (*)   ? Leukocytes,Ua LARGE (*)   ? RBC / HPF >50 (*)   ? WBC, UA >50 (*)   ? Bacteria, UA MANY (*)   ? All other components within normal limits  ?URINE CULTURE  ?RESPIRATORY PANEL BY PCR  ?RESP PANEL BY RT-PCR (FLU A&B, COVID) [id number]  ?MAGNESIUM  ?COMPREHENSIVE METABOLIC PANEL  ?CBC  ?TROPONIN I (HIGH SENSITIVITY)  ? ? ?EKG ?EKG Interpretation ? ?Date/Time:  Monday Oct 12 2021 20:21:49 EDT ?Ventricular Rate:  103 ?PR Interval:  136 ?QRS Duration: 125 ?QT Interval:  359 ?QTC  Calculation: 470 ?R Axis:   -5 ?Text Interpretation: Sinus tachycardia Multiform ventricular premature complexes IVCD, consider atypical LBBB No significant change since last tracing Confirmed by Deno Etienne [phone]) on 10/12/2021 8:45:03 PM ? ?Radiology ?CT Angio Chest PE W and/or Wo Contrast ? ?Result Date: 10/12/2021 ?CLINICAL DATA:  Pulmonary embolism (PE) suspected, high prob. Hypoxia  and dyspnea. COPD, CHF, asthma. EXAM: CT ANGIOGRAPHY CHEST WITH CONTRAST TECHNIQUE: Multidetector CT imaging of the chest was performed using the standard protocol during bolus administration of intravenous contrast. Multiplanar CT image reconstructions and MIPs were obtained to evaluate the vascular anatomy. RADIATION DOSE REDUCTION: This exam was performed according to the departmental dose-optimization program which includes automated exposure control, adjustment of the mA and/or kV according to patient size and/or use of iterative reconstruction technique. CONTRAST:  37m OMNIPAQUE IOHEXOL 350 MG/ML SOLN COMPARISON:  None Available. FINDINGS: Cardiovascular: Adequate opacification the pulmonary arterial tree. No intraluminal filling defect identified to suggest acute pulmonary embolism. Central pulmonary arteries are of normal caliber. Coronary artery bypass grafting has been performed. Mild cardiomegaly with left ventricular dilation. No pericardial effusion. Left subclavian dual lead pacemaker is in place with leads within the right atrium and right ventricle. Residual epicardial pacer leads noted. Mild atherosclerotic calcification within the thoracic aorta. Mediastinum/Nodes: No enlarged mediastinal, hilar, or axillary lymph nodes. Thyroid gland, trachea, and esophagus demonstrate no significant findings. Lungs/Pleura: Mild bibasilar atelectasis. Lungs are otherwise clear. No pneumothorax or pleural effusion. Central airways are widely patent. Upper Abdomen: No acute abnormality. Musculoskeletal: No acute bone abnormality. No  lytic or blastic bone lesion. Review of the MIP images confirms the above findings. IMPRESSION: No pulmonary embolism. Status post coronary artery bypass grafting. Mild cardiomegaly with left ventricular dilation. Aortic Athero

## 2021-10-13 ENCOUNTER — Observation Stay (HOSPITAL_COMMUNITY): Payer: Medicare Other

## 2021-10-13 DIAGNOSIS — D649 Anemia, unspecified: Secondary | ICD-10-CM | POA: Diagnosis present

## 2021-10-13 DIAGNOSIS — R0609 Other forms of dyspnea: Secondary | ICD-10-CM

## 2021-10-13 DIAGNOSIS — I2510 Atherosclerotic heart disease of native coronary artery without angina pectoris: Secondary | ICD-10-CM | POA: Diagnosis present

## 2021-10-13 DIAGNOSIS — Z681 Body mass index (BMI) 19 or less, adult: Secondary | ICD-10-CM | POA: Diagnosis not present

## 2021-10-13 DIAGNOSIS — Z8673 Personal history of transient ischemic attack (TIA), and cerebral infarction without residual deficits: Secondary | ICD-10-CM | POA: Diagnosis not present

## 2021-10-13 DIAGNOSIS — N401 Enlarged prostate with lower urinary tract symptoms: Secondary | ICD-10-CM | POA: Diagnosis present

## 2021-10-13 DIAGNOSIS — I69320 Aphasia following cerebral infarction: Secondary | ICD-10-CM | POA: Diagnosis not present

## 2021-10-13 DIAGNOSIS — Y846 Urinary catheterization as the cause of abnormal reaction of the patient, or of later complication, without mention of misadventure at the time of the procedure: Secondary | ICD-10-CM | POA: Diagnosis present

## 2021-10-13 DIAGNOSIS — N319 Neuromuscular dysfunction of bladder, unspecified: Secondary | ICD-10-CM | POA: Diagnosis present

## 2021-10-13 DIAGNOSIS — N3001 Acute cystitis with hematuria: Secondary | ICD-10-CM | POA: Diagnosis not present

## 2021-10-13 DIAGNOSIS — L89153 Pressure ulcer of sacral region, stage 3: Secondary | ICD-10-CM | POA: Diagnosis present

## 2021-10-13 DIAGNOSIS — F0390 Unspecified dementia without behavioral disturbance: Secondary | ICD-10-CM | POA: Diagnosis present

## 2021-10-13 DIAGNOSIS — B9620 Unspecified Escherichia coli [E. coli] as the cause of diseases classified elsewhere: Secondary | ICD-10-CM | POA: Diagnosis present

## 2021-10-13 DIAGNOSIS — E559 Vitamin D deficiency, unspecified: Secondary | ICD-10-CM | POA: Diagnosis present

## 2021-10-13 DIAGNOSIS — N390 Urinary tract infection, site not specified: Secondary | ICD-10-CM | POA: Diagnosis present

## 2021-10-13 DIAGNOSIS — I495 Sick sinus syndrome: Secondary | ICD-10-CM | POA: Diagnosis present

## 2021-10-13 DIAGNOSIS — Z20822 Contact with and (suspected) exposure to covid-19: Secondary | ICD-10-CM | POA: Diagnosis present

## 2021-10-13 DIAGNOSIS — R410 Disorientation, unspecified: Secondary | ICD-10-CM | POA: Diagnosis not present

## 2021-10-13 DIAGNOSIS — E876 Hypokalemia: Secondary | ICD-10-CM | POA: Diagnosis present

## 2021-10-13 DIAGNOSIS — E785 Hyperlipidemia, unspecified: Secondary | ICD-10-CM | POA: Diagnosis present

## 2021-10-13 DIAGNOSIS — I5042 Chronic combined systolic (congestive) and diastolic (congestive) heart failure: Secondary | ICD-10-CM | POA: Diagnosis present

## 2021-10-13 DIAGNOSIS — F05 Delirium due to known physiological condition: Secondary | ICD-10-CM | POA: Diagnosis present

## 2021-10-13 DIAGNOSIS — Z87440 Personal history of urinary (tract) infections: Secondary | ICD-10-CM | POA: Diagnosis not present

## 2021-10-13 DIAGNOSIS — Z515 Encounter for palliative care: Secondary | ICD-10-CM | POA: Diagnosis not present

## 2021-10-13 DIAGNOSIS — I110 Hypertensive heart disease with heart failure: Secondary | ICD-10-CM | POA: Diagnosis present

## 2021-10-13 DIAGNOSIS — I739 Peripheral vascular disease, unspecified: Secondary | ICD-10-CM | POA: Diagnosis present

## 2021-10-13 DIAGNOSIS — T83518A Infection and inflammatory reaction due to other urinary catheter, initial encounter: Secondary | ICD-10-CM | POA: Diagnosis present

## 2021-10-13 DIAGNOSIS — T83511A Infection and inflammatory reaction due to indwelling urethral catheter, initial encounter: Secondary | ICD-10-CM | POA: Diagnosis not present

## 2021-10-13 DIAGNOSIS — J441 Chronic obstructive pulmonary disease with (acute) exacerbation: Secondary | ICD-10-CM | POA: Diagnosis present

## 2021-10-13 DIAGNOSIS — J9601 Acute respiratory failure with hypoxia: Secondary | ICD-10-CM | POA: Diagnosis present

## 2021-10-13 DIAGNOSIS — E43 Unspecified severe protein-calorie malnutrition: Secondary | ICD-10-CM | POA: Diagnosis present

## 2021-10-13 DIAGNOSIS — Z7189 Other specified counseling: Secondary | ICD-10-CM | POA: Diagnosis not present

## 2021-10-13 DIAGNOSIS — K219 Gastro-esophageal reflux disease without esophagitis: Secondary | ICD-10-CM | POA: Diagnosis present

## 2021-10-13 LAB — CBC
HCT: 41.8 % (ref 39.0–52.0)
Hemoglobin: 13 g/dL (ref 13.0–17.0)
MCH: 27.9 pg (ref 26.0–34.0)
MCHC: 31.1 g/dL (ref 30.0–36.0)
MCV: 89.7 fL (ref 80.0–100.0)
Platelets: 217 10*3/uL (ref 150–400)
RBC: 4.66 MIL/uL (ref 4.22–5.81)
RDW: 15.9 % — ABNORMAL HIGH (ref 11.5–15.5)
WBC: 3.6 10*3/uL — ABNORMAL LOW (ref 4.0–10.5)
nRBC: 0 % (ref 0.0–0.2)

## 2021-10-13 LAB — COMPREHENSIVE METABOLIC PANEL
ALT: 15 U/L (ref 0–44)
AST: 15 U/L (ref 15–41)
Albumin: 3.1 g/dL — ABNORMAL LOW (ref 3.5–5.0)
Alkaline Phosphatase: 138 U/L — ABNORMAL HIGH (ref 38–126)
Anion gap: 4 — ABNORMAL LOW (ref 5–15)
BUN: 11 mg/dL (ref 8–23)
CO2: 31 mmol/L (ref 22–32)
Calcium: 9.9 mg/dL (ref 8.9–10.3)
Chloride: 106 mmol/L (ref 98–111)
Creatinine, Ser: 0.71 mg/dL (ref 0.61–1.24)
GFR, Estimated: >60 mL/min (ref >60)
Glucose, Bld: 112 mg/dL — ABNORMAL HIGH (ref 70–99)
Potassium: 3.8 mmol/L (ref 3.5–5.1)
Sodium: 141 mmol/L (ref 135–145)
Total Bilirubin: 0.4 mg/dL (ref 0.3–1.2)
Total Protein: 6.7 g/dL (ref 6.5–8.1)

## 2021-10-13 LAB — ECHOCARDIOGRAM COMPLETE
Area-P 1/2: 3.54 cm2
Calc EF: 22.2 %
Height: 72 in
S' Lateral: 6 cm
Single Plane A2C EF: 20.2 %
Single Plane A4C EF: 26.6 %
Weight: 2081.14 oz

## 2021-10-13 LAB — RESPIRATORY PANEL BY PCR

## 2021-10-13 LAB — RESP PANEL BY RT-PCR (FLU A&B, COVID) ARPGX2
Influenza A by PCR: NEGATIVE
Influenza B by PCR: NEGATIVE
SARS Coronavirus 2 by RT PCR: NEGATIVE

## 2021-10-13 MED ORDER — PREDNISONE 20 MG PO TABS
20.0000 mg | ORAL_TABLET | Freq: Every day | ORAL | Status: AC
  Administered 2021-10-13 – 2021-10-16 (×4): 20 mg via ORAL
  Filled 2021-10-13 (×4): qty 1

## 2021-10-13 MED ORDER — JUVEN PO PACK
1.0000 | PACK | Freq: Two times a day (BID) | ORAL | Status: DC
  Administered 2021-10-13 – 2021-10-19 (×13): 1 via ORAL
  Filled 2021-10-13 (×13): qty 1

## 2021-10-13 MED ORDER — SODIUM CHLORIDE 0.9 % IV SOLN
2.0000 g | INTRAVENOUS | Status: DC
  Administered 2021-10-13: 2 g via INTRAVENOUS
  Filled 2021-10-13 (×2): qty 20

## 2021-10-13 MED ORDER — ASCORBIC ACID 500 MG PO TABS
500.0000 mg | ORAL_TABLET | Freq: Two times a day (BID) | ORAL | Status: DC
  Administered 2021-10-13 – 2021-10-19 (×12): 500 mg via ORAL
  Filled 2021-10-13 (×12): qty 1

## 2021-10-13 MED ORDER — ZINC SULFATE 220 (50 ZN) MG PO CAPS
220.0000 mg | ORAL_CAPSULE | Freq: Every day | ORAL | Status: DC
  Administered 2021-10-13 – 2021-10-19 (×7): 220 mg via ORAL
  Filled 2021-10-13 (×7): qty 1

## 2021-10-13 NOTE — Progress Notes (Signed)
?PROGRESS NOTE ?Todd Dunn  LEX:[id number] DOB: 1941/04/07 DOA: 10/12/2021 ?PCP: [name], NP  ? ?Brief Narrative/Hospital Course: ?81 y.o.m w/ significant hx of dementia/ CVA with chronic aphasia,chronic Foley, history of ESBL UTI, hypertension, anemia, SSS s/p PPM,hyperlipidemia, PAD, CAD s/p CABG, , BPH, CHF, GERD presented with shortness of breath. ?History obtained with assistance of chart review and family as patient unable to provide significant history due to significant aphasia.  Reportedly she was short of breath was saturating 70% on room air for EMS.  He was released from rehab on the same day 5/8. ?He received nebulizer with improvement. ?Family states patient reporting SOB suprapubic pain at the site catheter.  ?  ?In SW:HQPRF signs in the ED significant only for desaturation on room air.  Saturating well on 2 L. Lab work-up included BMP with potassium 3.2, glucose 117.  CBC within normal limits.  Troponin negative.  BNP elevated to 259.  Urinalysis with hemoglobin, nitrates, large leukocytes, many bacteria.  Urine cultures pending.  Cxr-cardiomegaly no acute disease.  CT PE study negative for PE, status post CABG, cardiomegaly.  Patient received ceftriaxone in the ED and was admitted for further management   ?  ?Subjective: ?Seen and examined this morning.  Patient is alert awake follows commands appropriately, has baseline aphasia, complaints of painful suprapubic catheter site ?He is on 2 L nasal cannula ?Afebrile overnight ?  ?Assessment and Plan: ?Principal Problem: ?  Acute respiratory failure with hypoxia (HCC) ?Active Problems: ?  Chronic indwelling Foley catheter ?  HTN (hypertension) ?  Normocytic anemia ?  Pacemaker ?  Hyperlipidemia ?  PVD (peripheral vascular disease) ([place]) ?  BPH (benign prostatic hyperplasia) ?  Chronic systolic heart failure ([place]) ?  GERD (gastroesophageal reflux disease) ?  ?Acute respiratory failure with hypoxia: ?Unclear etiology patient complaining of  shortness of breath and was hypoxic on room air, improved on 2 L nasal cannula.  Differential includes CHF exacerbation, COPD exacerbation.  Known EF of 30 to 35% G1 DD in 2022, he had wheezing and responded to nebulizer suspecting some component of reactive airway disease.  Respiratory virus panel pending.  CTA chest reassuring and no PE troponin negative BNP slightly up 259.  For now continue with scheduled and as needed bronchodilators, low-dose prednisone 20 mg x 5 days cont supplemental oxygen, repeat echocardiogram pending. ?  ?Chronic indwelling Foley catheter ?Possible UTI ?BPH w/ foley suprapubic: ?UA grossly abnormal follow-up urine culture on empiric ceftriaxone for now.  Does have history of ESBL.  Follow-up bladder scan to evaluate for overload-due to ongoing pain complaint. ?  ?Dementia? ?CVA with chronic aphasia: Per family has baseline aphasia but able to understand, family does not believe he has dementia but he does get confused at times possibly delirium with acute illness.  Continue supportive care fall precaution. ?  ?HTN ?Hyperlipidemia ?PVD ?CAD s/p CABG: ?Blood pressure well controlled.  No chest pain troponins negative.  Continue home Plavix, amlodipine. He is Intolerant to statin. ?  ?Chronic systolic heart failure: Repeat echo monitor volume status ?  ?Normocytic anemia-stable ?  ?SSS s/p Pacemaker ?  ?GERD-cont ppi ? ?Low BMIBody mass index is 17.64 kg/m?.:  Obtain dietitian consult to evaluate for malnutrition ? ? ?DVT prophylaxis: enoxaparin (LOVENOX) injection 40 mg Start: 10/13/21 1000 ?Code Status:   Code Status: Full Code ?Family Communication: plan of care discussed with patient at bedside. ?Patient status is: Admitted as observation, remains hospitalized because of ongoing hypoxic respiratory failure, UTI treatment ?Level  of care: Telemetry  ? ?Dispo: The patient is from: home ?           Anticipated disposition: TBD ? ?Mobility Assessment (last 72 hours)   ? ? Mobility Assessment    ? ? [place] Name 10/13/21 0900 10/13/21 04:38:25  ?  ?  ?  ? Does patient have an order for bedrest or is patient medically unstable Yes- Bedfast (Level 1) - Complete Yes- Bedfast (Level 1) - Complete     ? What is the highest level of mobility based on the progressive mobility assessment? Level 1 (Bedfast) - Unable to balance while sitting on edge of bed Level 1 (Bedfast) - Unable to balance while sitting on edge of bed     ? ?  ?  ? ?  ?  ? ?Objective: ?Vitals last 24 hrs: ?Vitals:  ? 10/13/21 2836 10/13/21 0615 10/13/21 6294 10/13/21 0905  ?BP: 121/88   130/63  ?Pulse: (!) 107   (!) 102  ?Resp: 19  (!) 25 18  ?Temp:  97.8 ?F (36.6 ?C)  97.8 ?F (36.6 ?C)  ?TempSrc:  Oral  Oral  ?SpO2: 91%  91% 90%  ?Weight:      ?Height:      ? ?Weight change:  ? ?Physical Examination: ?General exam: alert awake, aphasia older than stated age, weak appearing. ?HEENT:Oral mucosa moist, Ear/Nose WNL grossly, dentition normal. ?Respiratory system: bilaterally clear BS,no use of accessory muscle. ?Cardiovascular system: S1 & S2 +, No JVD. ?Gastrointestinal system: Abdomen soft,NT,ND, BS+.  Suprapubic catheter present ?Nervous System:Alert, awake, moving extremities and grossly nonfocal. ?Extremities: LE edema NONE,distal peripheral pulses palpable.  ?Skin: No rashes,no icterus. ?MSK: aka, moving all extremities. ? ?Medications reviewed:  ?Scheduled Meds: ? amLODipine  10 mg Oral Daily  ? divalproex  125 mg Oral QHS  ? enoxaparin (LOVENOX) injection  40 mg Subcutaneous Q24H  ? feeding supplement  237 mL Oral TID BM  ? ipratropium-albuterol  3 mL Nebulization TID  ? pantoprazole  40 mg Oral Daily  ? potassium chloride  40 mEq Oral BID  ? QUEtiapine  50 mg Oral BID  ? sodium chloride flush  3 mL Intravenous Q12H  ? ?Continuous Infusions: ? ?Pressure Injury 09/26/21 Sacrum Stage 3 -  Full thickness tissue loss. Subcutaneous fat may be visible but bone, tendon or muscle are NOT exposed. (Active)  ?09/26/21 1800  ?Location: Sacrum  ?Location  Orientation:   ?Staging: Stage 3 -  Full thickness tissue loss. Subcutaneous fat may be visible but bone, tendon or muscle are NOT exposed.  ?Wound Description (Comments):   ?Present on Admission: Yes  ?Dressing Type Foam - Lift dressing to assess site every shift 10/13/21 0900  ? ?Diet Order   ? ?       ?  Diet Heart Room service appropriate? Yes; Fluid consistency: Thin; Fluid restriction: 2000 mL Fluid  Diet effective now       ?  ? ?  ?  ? ?  ?  ? ?  ?  ?  ? ? ?Intake/Output Summary (Last 24 hours) at 10/13/2021 1041 ?Last data filed at 10/13/2021 0920 ?Gross per 24 hour  ?Intake --  ?Output 450 ml  ?Net -450 ml  ? ?Net IO Since Admission: -450 mL [10/13/21 1041]  ?Wt Readings from Last 3 Encounters:  ?10/12/21 59 kg  ?10/02/21 59 kg  ?08/11/21 59 kg  ?  ? ?Unresulted Labs (From admission, onward)  ? ?  Start  Ordered  ? 10/19/21 0500  Creatinine, serum  (enoxaparin (LOVENOX)    CrCl >/= 30 ml/min)  Weekly,   R     ?Comments: while on enoxaparin therapy ?  ? Oct 16, 2021 2205  ? 16-Oct-2021 2140  Urine Culture  Once,   URGENT       ?Question:  Indication  Answer:  Sepsis  ? Oct 16, 2021 2139  ? ?  ?  ? ?  ?Data Reviewed: I have personally reviewed following labs and imaging studies ?CBC: ?Recent Labs  ?Lab 2021-10-16 ?2020 10/13/21 ?0454  ?WBC 5.1 3.6*  ?HGB 13.3 13.0  ?HCT 41.8 41.8  ?MCV 90.7 89.7  ?PLT 207 217  ? ?Basic Metabolic Panel: ?Recent Labs  ?Lab 10-16-2021 ?2020 2021-10-16 ?2205 10/13/21 ?0454  ?NA 138  --  141  ?K 3.2*  --  3.8  ?CL 105  --  106  ?CO2 27  --  31  ?GLUCOSE 117*  --  112*  ?BUN 15  --  11  ?CREATININE 0.73  --  0.71  ?CALCIUM 10.0  --  9.9  ?MG  --  2.0  --   ? ?GFR: ?Estimated Creatinine Clearance: 59.4 mL/min (by C-G formula based on SCr of 0.71 mg/dL). ?Liver Function Tests: ?Recent Labs  ?Lab 10/13/21 ?3235  ?AST 15  ?ALT 15  ?ALKPHOS 138*  ?BILITOT 0.4  ?PROT 6.7  ?ALBUMIN 3.1*  ? ?No results for input(s): LIPASE, AMYLASE in the last 168 hours. ?No results for input(s): AMMONIA in the last 168  hours. ?Coagulation Profile: ?No results for input(s): INR, PROTIME in the last 168 hours. ?BNP (last 3 results) ?No results for input(s): PROBNP in the last 8760 hours. ?HbA1C: ?No results for input(s)

## 2021-10-13 NOTE — Hospital Course (Addendum)
81 year old M with PMH of dementia, CVA, aphasia, BPH/chronic Foley, combined CHF, CAD/CABG, PAD, SSS/PPM, right AKA and recent hospitalization for ESBL UTI, acute metabolic encephalopathy and severe dehydration presenting with shortness of breath, hypoxia to 70s and suprapubic pain, and admitted for acute respiratory failure with hypoxia and possible urinary tract infection.  Patient was discharged from SNF to home on the day of admission.  Reportedly saturating in the 70s on room air when EMS arrived.  CTA chest negative for PE or other pulmonary finding but cardiomegaly.  BNP 259.  RVP negative. ? ?Patient was started on ceftriaxone and low-dose prednisone.  Respiratory failure resolved.  Urine culture with ESBL E. coli and Pseudomonas aeruginosa.  It is difficult to tell if this is UTI or colonization from chronic Foley.  Suprapubic Foley exchanged on 5/10.  Antibiotics changed to IV meropenem on 5/10.  Completed antibiotic course with fosfomycin 3 g on 5/12. ? ?Hospital course significant for intermittent confusion and agitation likely due to delirium that seems to have resolved.  However, he remains at risk of delirium.  Recommend frequent reorientation and delirium precautions. ? ?Therapy recommended SNF but insurance declined even after peer to peer review due to his chronic nonambulatory status without significant improvement with previous SNF stay.  He is discharged home with home health and DME.   ?

## 2021-10-13 NOTE — TOC Initial Note (Signed)
Transition of Care (TOC) - Initial/Assessment Note  ? ? ?Patient Details  ?Name: Todd Dunn ?MRN: [id number] ?Date of Birth: [birth year] ? ?Transition of Care [facility]) CM/SW Contact:    ?Dessa Phi, RN ?Phone Number: ?10/13/2021, 1:27 PM ? ?Clinical Narrative: Spoke to [facility] dtr Todd Dunn-d/c plan home. On 02-monitor if needed @ home.Family has own transport home.                ? ? ?Expected Discharge Plan: [place] ?Barriers to Discharge: Continued Medical Work up ? ? ?Patient Goals and CMS Choice ?Patient states their goals for this hospitalization and ongoing recovery are:: Home ?CMS Medicare.gov Compare Post Acute Care list provided to:: Patient Represenative (must comment) (grand dtr Todd Dunn) ?Choice offered to / list presented to : Adult Children ? ?Expected Discharge Plan and Services ?Expected Discharge Plan: [place] ?  ?Discharge Planning Services: CM Consult ?Post Acute Care Choice: Home Health ?Living arrangements for the past 2 months: [place] ?                ?  ?  ?  ?  ?  ?  ?  ?  ?  ?  ? ?Prior Living Arrangements/Services ?Living arrangements for the past 2 months: [place] ?Lives with:: Adult Children ?Patient language and need for interpreter reviewed:: Yes ?Do you feel safe going back to the place where you live?: Yes      ?Need for Family Participation in Patient Care: Yes (Comment) ?Care giver support system in place?: Yes (comment) ?Current home services: DME (w/c) ?Criminal Activity/Legal Involvement Pertinent to Current Situation/Hospitalization: No - Comment as needed ? ?Activities of Daily Living ?Home Assistive Devices/Equipment: None ?ADL Screening (condition at time of admission) ?Patient's cognitive ability adequate to safely complete daily activities?: No ?Is the patient deaf or have difficulty hearing?: No ?Does the patient have difficulty seeing, even when wearing glasses/contacts?: No ?Does the patient have difficulty  concentrating, remembering, or making decisions?: Yes ?Patient able to express need for assistance with ADLs?: No ?Does the patient have difficulty dressing or bathing?: Yes ?Independently performs ADLs?: No ?Communication: Needs assistance ?Is this a change from baseline?: Pre-admission baseline ?Dressing (OT): Dependent, Needs assistance ?Is this a change from baseline?: Pre-admission baseline ?Grooming: Dependent, Needs assistance ?Is this a change from baseline?: Pre-admission baseline ?Feeding: Needs assistance ?Is this a change from baseline?: Pre-admission baseline ?Bathing: Dependent, Needs assistance ?Is this a change from baseline?: Pre-admission baseline ?Toileting: Dependent, Needs assistance ?Is this a change from baseline?: Pre-admission baseline ?In/Out Bed: Dependent, Needs assistance ?Is this a change from baseline?: Pre-admission baseline ?Walks in Home: Dependent, Needs assistance ?Is this a change from baseline?: Pre-admission baseline ?Does the patient have difficulty walking or climbing stairs?: Yes ?Weakness of Legs: Both ?Weakness of Arms/Hands: Both ? ?Permission Sought/Granted ?Permission sought to share information with : Case Manager ?Permission granted to share information with : Yes, Verbal Permission Granted ? Share Information with NAME: Case manager ?   ?   ?   ? ?Emotional Assessment ?Appearance:: Appears stated age ?Attitude/Demeanor/Rapport: Gracious ?Affect (typically observed): Accepting ?Orientation: : Oriented to Self ?Alcohol / Substance Use: Not Applicable ?Psych Involvement: No (comment) ? ?Admission diagnosis:  COPD exacerbation ([place]) [J44.1] ?Acute cystitis with hematuria [N30.01] ?Acute respiratory failure with hypoxia ([name]) [J96.01] ?Patient Active Problem List  ? Diagnosis Date Noted  ? Acute respiratory failure with hypoxia ([name]) 10/12/2021  ? Pressure injury of skin 09/27/2021  ?  UTI due to extended-spectrum beta lactamase (ESBL) producing Escherichia coli 09/26/2021   ? Hypokalemia 09/26/2021  ? Dehydration 09/26/2021  ? 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 CAP (community acquired pneumonia) 06/21/2021  ? Severe sepsis ([name]) 06/21/2021  ? SOB (shortness of breath) 06/21/2021  ? GERD (gastroesophageal reflux disease)   ? AMS (altered mental status) 05/11/2021  ? Hypotension 05/11/2021  ? Iron deficiency anemia 05/07/2021  ? Oral phase dysphagia 04/28/2021  ? Protein-calorie malnutrition, severe 03/27/2021  ? Toe infection   ? PVD (peripheral vascular disease) ([name])   ? 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 Wound infection 03/20/2021  ? Pacemaker 03/20/2021  ? Elevated alkaline phosphatase level 03/20/2021  ? Hyperlipidemia 03/20/2021  ? Hematuria 02/14/2021  ? Urinary tract infection associated with indwelling urethral catheter ([name])   ? Normocytic anemia 01/06/2021  ? Catheter-associated urinary tract infection ([place]) 12/15/2020  ? Toxic metabolic encephalopathy 98/33/8250  ? Chronic indwelling Foley catheter 12/15/2020  ? HTN (hypertension) 12/15/2020  ? History of ESBL E. coli infection 12/15/2020  ? BPH (benign prostatic hyperplasia) 12/12/2019  ? Chronic systolic heart failure ([name]) 12/12/2019  ? ?PCP:  [name], NP ?Pharmacy:   ?[facility] [address] ?[place] [address] ?Phone: [phone] Fax: [phone] ? ?[facility] ?[address] ?[name] [address] ?Phone: [phone] Fax: [phone] ? ? ? ? ?Social Determinants of Health (SDOH) Interventions ?  ? ?Readmission Risk Interventions ? ?  08/04/2021  ?  9:19 AM 06/24/2021  ? 11:26 AM 02/17/2021  ?  9:12 AM  ?Readmission Risk Prevention Plan  ?Transportation Screening Complete Complete Complete  ?Medication Review Press photographer) Complete Complete Complete  ?PCP or Specialist appointment within 3-5 days of discharge  Complete Complete Complete  ?[place] or Home Care Consult Complete Complete Complete  ?SW Recovery Care/Counseling Consult Complete  Complete  ?Palliative Care Screening Not Applicable Not Applicable Not Applicable  ?Skilled Nursing Facility Complete Not Applicable Not Applicable  ? ? ? ?

## 2021-10-13 NOTE — Care Management Obs Status (Signed)
MEDICARE OBSERVATION STATUS NOTIFICATION ? ? ?Patient Details  ?Name: Todd Dunn ?MRN: [id number] ?Date of Birth: [birth year] ? ? ?Medicare Observation Status Notification Given:  Yes ? ? ? ?Dessa Phi, RN ?10/13/2021, 1:25 PM ?

## 2021-10-13 NOTE — Evaluation (Signed)
Physical Therapy Evaluation ?Patient Details ?Name: Todd Dunn ?MRN: [id number] ?DOB: 04-17-1941 ?Today's Date: 10/13/2021 ? ?History of Present Illness ? Patient is a 81 y.o. male who presented with worsening of suprapubic abdominal pain and SOB PMH: indwelling suprapubic urinary catheter secondary to severe BPH, CAD, PVD status post right AKA, SSS status post ICD, HTN, CVA with chronic aphasia, ? ?  ?Clinical Impression ? Todd Dunn is 81 y.o. mal admitted with above HPI and diagnosis. Patient is currently limited by functional impairments below (see PT problem list). Patient unable to provide PLOF or assist available at home. He was unable to follow commands and required multimodal cues throughout to complete bed mobility. Pt noted to be able to move Lt LE and has strong use of UE to reposition in bed intermittently however cognitive impairments prevented formal testing and pt was unable to sequence sitting up to EOB. Patient will benefit from continued skilled PT interventions to address impairments and progress independence with mobility, recommending SNF. Acute PT will follow and progress as able.    ?   ? ?Recommendations for follow up therapy are one component of a multi-disciplinary discharge planning process, led by the attending physician.  Recommendations may be updated based on patient status, additional functional criteria and insurance authorization. ? ?Follow Up Recommendations Skilled nursing-short term rehab (<3 hours/day) ? ?  ?Assistance Recommended at Discharge Frequent or constant Supervision/Assistance  ?Patient can return home with the following ? Two people to help with walking and/or transfers;Two people to help with bathing/dressing/bathroom;Assistance with cooking/housework;Assistance with feeding;Direct supervision/assist for financial management;Direct supervision/assist for medications management;Help with stairs or ramp for entrance;Assist for transportation ? ?   ?Equipment Recommendations Hospital bed;Wheelchair cushion (measurements PT);Wheelchair (measurements PT)  ?Recommendations for Other Services ?    ?  ?Functional Status Assessment Patient has had a recent decline in their functional status and demonstrates the ability to make significant improvements in function in a reasonable and predictable amount of time.  ? ?  ?Precautions / Restrictions Precautions ?Precautions: Fall ?Precaution Comments: suprapubic cath ?Restrictions ?Weight Bearing Restrictions: No ?Other Position/Activity Restrictions: R AKA  ? ?  ? ?Mobility ? Bed Mobility ?Overal bed mobility: Needs Assistance ?Bed Mobility: Rolling ?Rolling: Mod assist, +2 for physical assistance, +2 for safety/equipment ?  ?  ?  ?  ?General bed mobility comments: Mod +2 to roll repeatedly for linen change. pt requried multimodal cues to follow commands to roll. ?  ? ?Transfers ?  ?  ?  ?  ?  ?  ?  ?  ?  ?  ?  ? ?Ambulation/Gait ?  ?  ?  ?  ?  ?  ?  ?  ? ?Stairs ?  ?  ?  ?  ?  ? ?Wheelchair Mobility ?  ? ?Modified Rankin (Stroke Patients Only) ?  ? ?  ? ?Balance Overall balance assessment: Needs assistance ?Sitting-balance support: Feet supported, Bilateral upper extremity supported ?Sitting balance-Leahy Scale: Poor ?Sitting balance - Comments: min guard up to modA for sitting balance due to impulsivity ?  ?Standing balance support: Bilateral upper extremity supported ?Standing balance-Leahy Scale: Zero ?  ?  ?  ?  ?  ?  ?  ?  ?  ?  ?  ?  ?   ? ? ? ?Pertinent Vitals/Pain Pain Assessment ?Pain Assessment: PAINAD ?Faces Pain Scale: No hurt ?Breathing: normal ?Negative Vocalization: none ?Facial Expression: smiling or inexpressive ?Body Language: relaxed ?Consolability: no need to console ?PAINAD Score:  0 ?Pain Intervention(s): Monitored during session, Repositioned  ? ? ?Home Living Family/patient expects to be discharged to:: Unsure ?Living Arrangements: Other relatives ?Available Help at Discharge: Family ?Type of  Home: House ?Home Access: Stairs to enter ?  ?Entrance Stairs-Number of Steps: 2 ?  ?Home Layout: One level ?Home Equipment: Conservation officer, nature (2 wheels);Wheelchair - manual;Grab bars - tub/shower ?Additional Comments: no family present in room at this time. all information was obtained from chart/prior admission.  ?  ?Prior Function Prior Level of Function : Needs assist;Patient poor historian/Family not available ? Cognitive Assist : ADLs (cognitive);Mobility (cognitive) ?Mobility (Cognitive): Intermittent cues ?ADLs (Cognitive): Intermittent cues ?Physical Assist : Mobility (physical);ADLs (physical) ?Mobility (physical): Bed mobility;Transfers ?ADLs (physical): Grooming;Bathing;Dressing;Toileting;IADLs ?Mobility Comments: patient uses a wheelchair for mobility per chart ?  ?  ? ? ?Hand Dominance  ? Dominant Hand: Right ? ?  ?Extremity/Trunk Assessment  ? Upper Extremity Assessment ?Upper Extremity Assessment: Defer to OT evaluation ?  ? ?Lower Extremity Assessment ?Lower Extremity Assessment: RLE deficits/detail;LLE deficits/detail ?RLE Deficits / Details: Healed AKA with sore at tip of residual limb ?LLE Deficits / Details: pt voluntarily completes SLR and lifts/swings hip into abd/add wtih rolling in bed. does not follow commands well for testing. ?  ? ?Cervical / Trunk Assessment ?Cervical / Trunk Assessment: Other exceptions ?Cervical / Trunk Exceptions: suprapubic catheter  ?Communication  ? Communication: Expressive difficulties  ?Cognition Arousal/Alertness: Awake/alert ?Behavior During Therapy: Restless, Impulsive ?Overall Cognitive Status: No family/caregiver present to determine baseline cognitive functioning ?Area of Impairment: Orientation, Following commands, Problem solving, Safety/judgement ?  ?  ?  ?  ?  ?  ?  ?  ?Orientation Level: Disoriented to, Place, Time, Situation ?  ?  ?Following Commands: Follows one step commands inconsistently ?Safety/Judgement: Decreased awareness of safety, Decreased  awareness of deficits ?  ?Problem Solving: Slow processing, Decreased initiation, Difficulty sequencing, Requires verbal cues, Requires tactile cues ?General Comments: mumbled speech throughout pt difficult to understand, follows commands ~25% of time ?  ?  ? ?  ?General Comments   ? ?  ?Exercises    ? ?Assessment/Plan  ?  ?PT Assessment Patient needs continued PT services  ?PT Problem List Decreased strength;Decreased activity tolerance;Decreased balance;Decreased mobility;Decreased coordination;Decreased cognition;Decreased knowledge of use of DME;Decreased safety awareness ? ?   ?  ?PT Treatment Interventions DME instruction;Functional mobility training;Gait training;Therapeutic activities;Therapeutic exercise;Balance training;Neuromuscular re-education;Cognitive remediation;Wheelchair mobility training;Patient/family education   ? ?PT Goals (Current goals can be found in the Care Plan section)  ?Acute Rehab PT Goals ?Patient Stated Goal: unable to state; asking for food and water throughout session. ?PT Goal Formulation: Patient unable to participate in goal setting ?Time For Goal Achievement: 10/27/21 ?Potential to Achieve Goals: Fair ? ?  ?Frequency Min 2X/week ?  ? ? ?Co-evaluation PT/OT/SLP Co-Evaluation/Treatment: Yes ?Reason for Co-Treatment: To address functional/ADL transfers;For patient/therapist safety ?PT goals addressed during session: Balance;Strengthening/ROM;Mobility/safety with mobility ?OT goals addressed during session: ADL's and self-care ?  ? ? ?  ?AM-PAC PT "6 Clicks" Mobility  ?Outcome Measure Help needed turning from your back to your side while in a flat bed without using bedrails?: Total ?Help needed moving from lying on your back to sitting on the side of a flat bed without using bedrails?: Total ?Help needed moving to and from a bed to a chair (including a wheelchair)?: Total ?Help needed standing up from a chair using your arms (e.g., wheelchair or bedside chair)?: Total ?Help needed  to walk in hospital room?: Total ?Help needed climbing  3-5 steps with a railing? : Total ?6 Click Score: 6 ? ?  ?End of Session Equipment Utilized During Treatment: Gait belt ?Activity Tolerance: Patient tolerated t

## 2021-10-13 NOTE — Progress Notes (Signed)
Initial Nutrition Assessment ? ?DOCUMENTATION CODES:  ? ?Severe malnutrition in context of chronic illness, Underweight ? ?INTERVENTION:  ?- continue Ensure Plus High Protein TID, each supplement provides 350 kcal and 20 grams of protein. ? ?- will order 1 packet Juven BID, each packet provides 95 calories, 2.5 grams of protein (collagen), and 9.8 grams of carbohydrate (3 grams sugar); also contains 7 grams of L-arginine and L-glutamine, 300 mg vitamin C, 15 mg vitamin E, 1.2 mcg vitamin B-12, 9.5 mg zinc, 200 mg calcium, and 1.5 g  Calcium Beta-hydroxy-Beta-methylbutyrate to support wound healing ? ?- will order 500 mg ascorbic acid BID and 220 mg zinc sulfate once/day x14 days to aid in wound healing.  ? ? ?NUTRITION DIAGNOSIS:  ? ?Severe Malnutrition related to chronic illness as evidenced by severe fat depletion, severe muscle depletion. ? ?GOAL:  ? ?Patient will meet greater than or equal to 90% of their needs ? ?MONITOR:  ? ?PO intake, Supplement acceptance, Labs, Weight trends, Skin ? ?REASON FOR ASSESSMENT:  ? ?Malnutrition Screening Tool ? ?ASSESSMENT:  ? ?81 year old male with medical history of dementia, CVA with chronic aphasia, chronic Foley, HTN, anemia, SSS s/p PPM, HLD, PAD, CAD s/p CABG, BPH, CHF, and GERD. He presented to the ED due to shortness of breath. He had returned home from rehab on the same day as presentation to the ED (5/8). ? ?Patient laying in bed with blanket over his head. No visitors present at the time of RD visit. Patient agreeable to pulling the blanket down and allowing RD to complete NFPE. He has aphasia and RD unfortunately was unable to make out much of what patient was saying.  ? ?Flow sheet documentation indicates he ate 100% of breakfast this AM. Noted an empty bottle of Ensure in trash can next to his bed.  ? ?Weight yesterday was 130 lb and weight bas been fairly stable since 06/24/21. Weight on 05/28/21 was 135 lb.  ? ?Per notes: ?- acute respiratory failure with  hypoxia ?- possible UTI ?- hx of CVA with chronic aphasia ? ? ?Labs reviewed; Alk Phos elevated.  ?Medications reviewed; 40 mg oral protonix/day, 40 mEq Klor-Con BID, 20 mg deltasone/day (5/9-5/12). ? ?  ? ?NUTRITION - FOCUSED PHYSICAL EXAM: ? ?Flowsheet Row Most Recent Value  ?Orbital Region Moderate depletion  ?Upper Arm Region Severe depletion  ?Thoracic and Lumbar Region Unable to assess  ?Buccal Region Severe depletion  ?Temple Region Moderate depletion  ?Clavicle Bone Region Severe depletion  ?Clavicle and Acromion Bone Region Severe depletion  ?Scapular Bone Region Unable to assess  ?Dorsal Hand Moderate depletion  ?Patellar Region Severe depletion  [R AKA]  ?Anterior Thigh Region Severe depletion  [R AKA]  ?Posterior Calf Region Severe depletion  [R AKA]  ?Edema (RD Assessment) None  ?Hair Reviewed  ?Eyes Reviewed  ?Mouth Reviewed  [poor dentition]  ?Skin Reviewed  ?Nails Reviewed  ? ?  ? ? ?Diet Order:   ?Diet Order   ? ?       ?  Diet Heart Room service appropriate? Yes; Fluid consistency: Thin; Fluid restriction: 2000 mL Fluid  Diet effective now       ?  ? ?  ?  ? ?  ? ? ?EDUCATION NEEDS:  ? ?Not appropriate for education at this time ? ?Skin:  Skin Assessment: Skin Integrity Issues: ?Skin Integrity Issues:: Stage III ?Stage III: sacrum ? ?Last BM:  PTA/unknown ? ?Height:  ? ?Ht Readings from Last 1 Encounters:  ?10/12/21  6' (1.829 m)  ? ? ?Weight:  ? ?Wt Readings from Last 1 Encounters:  ?10/12/21 59 kg  ? ? ? ?BMI:  Body mass index is 17.64 kg/m?. ? ?Estimated Nutritional Needs:  ?Kcal:  1800-2000 kcal ?Protein:  90-105 grams ?Fluid:  >/= 2 L/day ? ? ? ? ?Jarome Matin, MS, RD, LDN ?Registered Dietitian II ?Inpatient Clinical Nutrition ?RD pager # and on-call/weekend pager # available in [place]  ? ?

## 2021-10-13 NOTE — Progress Notes (Signed)
?  Echocardiogram ?2D Echocardiogram has been performed. ? Todd Dunn ?10/13/2021, 11:36 AM ?

## 2021-10-13 NOTE — Evaluation (Signed)
Occupational Therapy Evaluation ?Patient Details ?Name: Todd Dunn ?MRN: [id number] ?DOB: [birth year] ?Today's Date: 10/13/2021 ? ? ?History of Present Illness Patient is a 81 y.o. male who presented with worsening of suprapubic abdominal pain and SOB PMH: indwelling suprapubic urinary catheter secondary to severe BPH, CAD, PVD status post right AKA, SSS status post ICD, HTN, CVA with chronic aphasia,  ? ?Clinical Impression ?  ?Patient is a 81 year old male who was admitted for above. Patient was noted to be impulsive with all movements and had difficulty following commands with increased time provided. Patient was unable to follow commands for forma strength testing but noted to have strong grasp when holding therapist hand when rolling to each side. Patient was TD for ADLs on this date.  Patient will need 24/7 caregiver support and physical assistance at home to be successful. Patient would continue to benefit from skilled OT services at this time while admitted and after d/c to address noted deficits in order to improve overall safety and independence in ADLs.  ? ?   ? ?Recommendations for follow up therapy are one component of a multi-disciplinary discharge planning process, led by the attending physician.  Recommendations may be updated based on patient status, additional functional criteria and insurance authorization.  ? ?Follow Up Recommendations ? Skilled nursing-short term rehab (<3 hours/day)  ?  ?Assistance Recommended at Discharge Frequent or constant Supervision/Assistance  ?Patient can return home with the following Two people to help with walking and/or transfers;Two people to help with bathing/dressing/bathroom;Assistance with cooking/housework;Assistance with feeding;Direct supervision/assist for medications management;Direct supervision/assist for financial management;Assist for transportation;Help with stairs or ramp for entrance ? ?  ?Functional Status Assessment ? Patient has had a recent  decline in their functional status and demonstrates the ability to make significant improvements in function in a reasonable and predictable amount of time.  ?Equipment Recommendations ? None recommended by OT  ?  ?Recommendations for Other Services   ? ? ?  ?Precautions / Restrictions Precautions ?Precautions: Fall ?Precaution Comments: suprapubic cath ?Restrictions ?Weight Bearing Restrictions: No ?Other Position/Activity Restrictions: R AKA  ? ?  ? ?Mobility Bed Mobility ?Overal bed mobility: Needs Assistance ?  ?Rolling: Mod assist, +2 for safety/equipment ?  ?  ?  ?  ?General bed mobility comments: patient is mod A +2 for safety with rolling with patient increasingly impulsive and grabbing hands of therapists with movements. ?  ?  ?   ? ?ADL either performed or assessed with clinical judgement  ? ?ADL Overall ADL's : Needs assistance/impaired ?Eating/Feeding: Bed level;Maximal assistance ?  ?Grooming: Bed level;Maximal assistance ?  ?Upper Body Bathing: Bed level;Maximal assistance ?  ?Lower Body Bathing: Bed level;Maximal assistance ?  ?Upper Body Dressing : Bed level;Maximal assistance ?  ?Lower Body Dressing: Bed level;Maximal assistance ?  ?Toilet Transfer: +2 for physical assistance;+2 for safety/equipment ?Toilet Transfer Details (indicate cue type and reason): patient was impulsive with movements and resititive to attempts to sit up on the edge of bed today. ?Toileting- Clothing Manipulation and Hygiene: Total assistance;Bed level ?Toileting - Clothing Manipulation Details (indicate cue type and reason): patient was found saturated in bed with cleaned with new lines with nurse in room to assist with dressing change to bottom and address concerns over catheter orientation upon entery to room ?  ?  ?  ?General ADL Comments: Pt very impulsive and ataxic this session, diffiuculty following commands  ? ? ? ?Vision   ?Additional Comments: unable to assess vision offically. patient was note  dto sacn room but  unclear as to how much he is able to see  ?   ?Perception   ?  ?Praxis   ?  ? ?Pertinent Vitals/Pain Pain Assessment ?Pain Assessment: No/denies pain  ? ? ? ?Hand Dominance Right ?  ?Extremity/Trunk Assessment Upper Extremity Assessment ?Upper Extremity Assessment: Difficult to assess due to impaired cognition (patient was not able to follow commands for MMT or ROm testing) ?  ?Lower Extremity Assessment ?Lower Extremity Assessment: Defer to PT evaluation ?  ?Cervical / Trunk Assessment ?Cervical / Trunk Assessment: Other exceptions ?Cervical / Trunk Exceptions: suprapubic catheter ?  ?Communication Communication ?Communication: Expressive difficulties ?  ?Cognition Arousal/Alertness: Awake/alert ?Behavior During Therapy: Restless, Impulsive ?Overall Cognitive Status: No family/caregiver present to determine baseline cognitive functioning ?Area of Impairment: Following commands, Safety/judgement, Orientation ?  ?  ?  ?  ?  ?  ?  ?  ?Orientation Level: Person ?  ?  ?Following Commands: Follows one step commands inconsistently ?Safety/Judgement: Decreased awareness of safety, Decreased awareness of deficits ?  ?  ?General Comments: patient was difficult to understand at times. patient was notd to follow about 25% of commands. ?  ?  ?General Comments    ? ?  ?Exercises   ?  ?Shoulder Instructions    ? ? ?Home Living Family/patient expects to be discharged to:: Unsure ?  ?  ?  ?  ?  ?  ?  ?  ?  ?  ?  ?  ?  ?  ?  ?  ?Additional Comments: no family present in room at this time. all information was obtained from chart. ?  ? ?  ?Prior Functioning/Environment Prior Level of Function : Needs assist;Patient poor historian/Family not available ? Cognitive Assist : ADLs (cognitive);Mobility (cognitive) ?  ?ADLs (Cognitive): Intermittent cues ?Physical Assist : Mobility (physical);ADLs (physical) ?Mobility (physical): Bed mobility;Transfers ?ADLs (physical): Grooming;Bathing;Dressing;Toileting;IADLs ?Mobility Comments: patient  uses a wheelchair for mobility per chart ?  ?  ? ?  ?  ?OT Problem List: Decreased activity tolerance;Impaired balance (sitting and/or standing);Decreased coordination;Decreased safety awareness ?  ?   ?OT Treatment/Interventions: Self-care/ADL training;DME and/or AE instruction;Therapeutic activities;Cognitive remediation/compensation;Patient/family education;Balance training  ?  ?OT Goals(Current goals can be found in the care plan section) Acute Rehab OT Goals ?Patient Stated Goal: none stated ?OT Goal Formulation: Patient unable to participate in goal setting ?Time For Goal Achievement: 10/27/21 ?Potential to Achieve Goals: Fair  ?OT Frequency: Min 2X/week ?  ? ?Co-evaluation   ?Reason for Co-Treatment: Complexity of the patient's impairments (multi-system involvement);Necessary to address cognition/behavior during functional activity;To address functional/ADL transfers ?PT goals addressed during session: Mobility/safety with mobility ?OT goals addressed during session: ADL's and self-care ?  ? ?  ?AM-PAC OT "6 Clicks" Daily Activity     ?Outcome Measure Help from another person eating meals?: A Lot ?Help from another person taking care of personal grooming?: A Lot ?Help from another person toileting, which includes using toliet, bedpan, or urinal?: Total ?Help from another person bathing (including washing, rinsing, drying)?: Total ?Help from another person to put on and taking off regular upper body clothing?: Total ?Help from another person to put on and taking off regular lower body clothing?: Total ?6 Click Score: 8 ?  ?End of Session Nurse Communication: Mobility status;Other (comment) (concerns over catheter and patients requests for food) ? ?Activity Tolerance: Patient limited by fatigue ?Patient left: in bed;with call bell/phone within reach;with bed alarm set ? ?OT Visit Diagnosis: Other abnormalities of gait  and mobility (R26.89);Muscle weakness (generalized) (M62.81);Other symptoms and signs  involving cognitive function  ?              ?Time: 3612-2449 ?OT Time Calculation (min): 25 min ?Charges:  OT General Charges ?$OT Visit: 1 Visit ?OT Evaluation ?$OT Eval Moderate Complexity: 1 Mod ? ?[employer]

## 2021-10-14 ENCOUNTER — Ambulatory Visit: Payer: Medicare Other | Admitting: Nurse Practitioner

## 2021-10-14 ENCOUNTER — Encounter: Payer: Self-pay | Admitting: Internal Medicine

## 2021-10-14 DIAGNOSIS — Z8679 Personal history of other diseases of the circulatory system: Secondary | ICD-10-CM

## 2021-10-14 DIAGNOSIS — N390 Urinary tract infection, site not specified: Secondary | ICD-10-CM

## 2021-10-14 DIAGNOSIS — Z7189 Other specified counseling: Secondary | ICD-10-CM | POA: Diagnosis not present

## 2021-10-14 DIAGNOSIS — R410 Disorientation, unspecified: Secondary | ICD-10-CM | POA: Insufficient documentation

## 2021-10-14 DIAGNOSIS — I5042 Chronic combined systolic (congestive) and diastolic (congestive) heart failure: Secondary | ICD-10-CM

## 2021-10-14 DIAGNOSIS — T83511A Infection and inflammatory reaction due to indwelling urethral catheter, initial encounter: Secondary | ICD-10-CM

## 2021-10-14 DIAGNOSIS — Z8619 Personal history of other infectious and parasitic diseases: Secondary | ICD-10-CM

## 2021-10-14 DIAGNOSIS — Z515 Encounter for palliative care: Secondary | ICD-10-CM | POA: Diagnosis not present

## 2021-10-14 DIAGNOSIS — J9601 Acute respiratory failure with hypoxia: Secondary | ICD-10-CM | POA: Diagnosis not present

## 2021-10-14 DIAGNOSIS — I495 Sick sinus syndrome: Secondary | ICD-10-CM

## 2021-10-14 DIAGNOSIS — Z8673 Personal history of transient ischemic attack (TIA), and cerebral infarction without residual deficits: Secondary | ICD-10-CM

## 2021-10-14 DIAGNOSIS — R4189 Other symptoms and signs involving cognitive functions and awareness: Secondary | ICD-10-CM

## 2021-10-14 DIAGNOSIS — N3001 Acute cystitis with hematuria: Secondary | ICD-10-CM | POA: Diagnosis not present

## 2021-10-14 DIAGNOSIS — Z89611 Acquired absence of right leg above knee: Secondary | ICD-10-CM

## 2021-10-14 DIAGNOSIS — E43 Unspecified severe protein-calorie malnutrition: Secondary | ICD-10-CM

## 2021-10-14 LAB — BASIC METABOLIC PANEL
Anion gap: 6 (ref 5–15)
BUN: 16 mg/dL (ref 8–23)
CO2: 29 mmol/L (ref 22–32)
Calcium: 10.5 mg/dL — ABNORMAL HIGH (ref 8.9–10.3)
Chloride: 105 mmol/L (ref 98–111)
Creatinine, Ser: 0.61 mg/dL (ref 0.61–1.24)
GFR, Estimated: >60 mL/min (ref >60)
Glucose, Bld: 107 mg/dL — ABNORMAL HIGH (ref 70–99)
Potassium: 4.7 mmol/L (ref 3.5–5.1)
Sodium: 140 mmol/L (ref 135–145)

## 2021-10-14 LAB — CBC
HCT: 40.8 % (ref 39.0–52.0)
Hemoglobin: 12.4 g/dL — ABNORMAL LOW (ref 13.0–17.0)
MCH: 27.6 pg (ref 26.0–34.0)
MCHC: 30.4 g/dL (ref 30.0–36.0)
MCV: 90.9 fL (ref 80.0–100.0)
Platelets: 210 10*3/uL (ref 150–400)
RBC: 4.49 MIL/uL (ref 4.22–5.81)
RDW: 15.6 % — ABNORMAL HIGH (ref 11.5–15.5)
WBC: 5.7 10*3/uL (ref 4.0–10.5)
nRBC: 0 % (ref 0.0–0.2)

## 2021-10-14 MED ORDER — MEROPENEM 1 G IV SOLR
1.0000 g | Freq: Three times a day (TID) | INTRAVENOUS | Status: DC
  Administered 2021-10-14 – 2021-10-16 (×6): 1 g via INTRAVENOUS
  Filled 2021-10-14 (×7): qty 20

## 2021-10-14 NOTE — Assessment & Plan Note (Addendum)
Recently hospitalized for ESBL UTI and discharged to SNF.  Urine culture with ESBL E. coli and Pseudomonas aeruginosa.  Difficult to tell if this is true UTI or colonization from chronic Foley but patient had some mental status change.  ?-Ceftriaxone 5/9-5/10>>> meropenem 5/10-5/12>> fosfomycin 5/12 ?-Changed suprapubic catheter on 5/10 ?

## 2021-10-14 NOTE — Consult Note (Signed)
? ?                                                                                ?Consultation Note ?Date: 10/14/2021  ? ?Patient Name: Todd Dunn  ?DOB: 10-24-1940  MRN: [id number]  Age / Sex: 81 y.o., male  ?PCP: [name], NP ?Referring Physician: Mercy Riding, MD ? ?Reason for Consultation: Establishing goals of care ? ?HPI/Patient Profile: 81 y.o. male  with past medical history of dementia, chronic foley, history of ESBL UTI, hypertension, anemia, sick sinus syndrome post pacemaker, hyperlipidemia, PAD, CAD s/p CABG, CVA with chronic aphasia, BPH, CHF, GERD admitted on 10/12/2021 with shortness of breath with acute on chronic respiratory failure.  ? ?Clinical Assessment and Goals of Care: ?I have reviewed Mr. 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 Mr. Mendonca appears confused but is interactive. He has a history of aphasia and difficulty with communication since previous stroke. He makes attempts to communicate (most unsuccessful). He points and looks around the room and appears confused. I explained that he is at [facility] for UTI and breathing difficulty. I explained that I will call and speak with his granddaughter [name]. He smiles and nods his head yes. I was able to understand the word "food" and understand that he is hungry. He pulled himself up in bed to prepare to eat. Discussed with NT who says he fed himself breakfast and ate well and she attempted to help him with lunch but just pushed it away but will try again.  ? ?I called and spoke with [name] and provided update from above. [name] shares details about her grandfather's health issues and rehab stays over the past year or two. [name] shares that they have a caregiver for Mon-Fri (except Wed when her sister stays with him) and she and family are with him all other times - he is never left  alone. She talks about bad experiences in SNF rehabs but if rehab is what the doctor recommends as best for him they are willing to pursue. They are also prepared to care for him at home if the doctors believe this is best for him. We discussed goals of care and [name] shares that she discusses all decisions with her grandfather and he is able to make his own decisions. He made the decisions for his amputations but has since told her that he does NOT want another amputation. We reviewed his declining health and quality of life. We discussed realistic expectations of resuscitation efforts and the importance of discussing code status as this is not something that can be discussed at the time. [name] understands and does comment that she feels he has previously stated he would not want to be resuscitated. [name] plans to discuss with her grandfather tonight when she comes to visit. We agree to touch base again tomorrow to share updates on status and conversations.  ? ?All questions/concerns addressed. Emotional support provided.  ? ?Primary Decision Maker ?NEXT OF KIN [name] ?  ? ?SUMMARY OF RECOMMENDATIONS   ?- Discussion regarding wishes for DNR status ?- Will need  ongoing goals of care conversations and follow up from outpatient palliative care ? ?Code Status/Advance Care Planning: ?Full code - considering DNR ? ? ?Symptom Management:  ?Per attending.  ? ?Palliative Prophylaxis:  ?Aspiration, Bowel Regimen, Delirium Protocol, Frequent Pain Assessment, Oral Care, and Turn Reposition ? ? ?Prognosis:  ?Overall prognosis poor with recurrent health complications in setting of declining functional status.  ? ?Discharge Planning: To Be Determined  ? ?  ? ?Primary Diagnoses: ?Present on Admission: ? HTN (hypertension) ? Normocytic anemia ? Pacemaker ? Hyperlipidemia ? PVD (peripheral vascular disease) ([place]) ? BPH (benign prostatic hyperplasia) ? Chronic systolic heart failure ([place]) ? GERD (gastroesophageal reflux disease) ?  Acute respiratory failure with hypoxia ([place]) ? ? ?I have reviewed the medical record, interviewed the patient and family, and examined the patient. The following aspects are pertinent. ? ?Past Medical History:  ?Diagnosis Date  ? BPH (benign prostatic hyperplasia)   ? CAD (coronary artery disease)   ? Chronic indwelling Foley catheter   ? CVA (cerebral vascular accident) [facility])   ? GERD (gastroesophageal reflux disease)   ? HTN (hypertension)   ? Neurogenic bladder   ? SSS (sick sinus syndrome) ([place])   ? s/p MDT ICD  ? Vitamin D deficiency   ? ?Social History  ? ?Socioeconomic History  ? Marital status: Widowed  ?  Spouse name: Not on file  ? Number of children: Not on file  ? Years of education: Not on file  ? Highest education level: Not on file  ?Occupational History  ? Not on file  ?Tobacco Use  ? Smoking status: Former  ?  Types: Cigarettes  ?  Passive exposure: Never  ? Smokeless tobacco: Never  ?Substance and Sexual Activity  ? Alcohol use: Not Currently  ? Drug use: Never  ? Sexual activity: Not on file  ?Other Topics Concern  ? Not on file  ?Social History Narrative  ? ** Merged History Encounter **  ?    ? ?Social Determinants of Health  ? ?Financial Resource Strain: Not on file  ?Food Insecurity: Not on file  ?Transportation Needs: Not on file  ?Physical Activity: Not on file  ?Stress: Not on file  ?Social Connections: Not on file  ? ?Family History  ?Family history unknown: Yes  ? ?Scheduled Meds: ? amLODipine  10 mg Oral Daily  ? vitamin C  500 mg Oral BID  ? divalproex  125 mg Oral QHS  ? enoxaparin (LOVENOX) injection  40 mg Subcutaneous Q24H  ? feeding supplement  237 mL Oral TID BM  ? nutrition supplement (JUVEN)  1 packet Oral BID BM  ? pantoprazole  40 mg Oral Daily  ? potassium chloride  40 mEq Oral BID  ? predniSONE  20 mg Oral Q breakfast  ? QUEtiapine  50 mg Oral BID  ? sodium chloride flush  3 mL Intravenous Q12H  ? zinc sulfate  220 mg Oral Daily  ? ?Continuous Infusions: ? cefTRIAXone  (ROCEPHIN)  IV 2 g (10/13/21 2202)  ? ?PRN Meds:.acetaminophen **OR** acetaminophen, albuterol, oxybutynin, polyethylene glycol ?Allergies  ?Allergen Reactions  ? Food Anaphylaxis and Rash  ?  Clams  ? Aspirin Itching  ? Crestor [Rosuvastatin Calcium] Other (See Comments)  ?  Muscle pain  ? [place] Clam (M. Mercenaria) Skin Test Itching  ? Penicillins Itching  ? ?Review of Systems  ?Unable to perform ROS: Other  ?Constitutional:   ?     Struggles to communicate due to effects of past  stroke  ? ?Physical Exam ?Vitals and nursing note reviewed.  ?Constitutional:   ?   Appearance: He is cachectic. He is ill-appearing.  ?Cardiovascular:  ?   Rate and Rhythm: Normal rate.  ?Pulmonary:  ?   Effort: No tachypnea, accessory muscle usage or respiratory distress.  ?Abdominal:  ?   Palpations: Abdomen is soft.  ?Neurological:  ?   Mental Status: He is alert. He is confused.  ? ? ?Vital Signs: BP 120/60 (BP Location: Right Leg)   Pulse 68   Temp 98 ?F (36.7 ?C)   Resp 18   Ht 6' (1.829 m)   Wt 59 kg   SpO2 100%   BMI 17.64 kg/m?  ?Pain Scale: PAINAD ?POSS *See Group Information*: 1-Acceptable,Awake and alert ?Pain Score: 0-No pain ? ? ?SpO2: SpO2: 100 % ?O2 Device:SpO2: 100 % ?O2 Flow Rate: .O2 Flow Rate (L/min): 1 L/min ? ?IO: Intake/output summary:  ?Intake/Output Summary (Last 24 hours) at 10/14/2021 1100 ?Last data filed at 10/13/2021 2347 ?Gross per 24 hour  ?Intake 1060 ml  ?Output 300 ml  ?Net 760 ml  ? ? ?LBM:   ?Baseline Weight: Weight: 59 kg ?Most recent weight: Weight: 59 kg     ?Palliative Assessment/Data: ? ? ? ? ?Time Total: 80 min ? ?Greater than 50%  of this time was spent counseling and coordinating care related to the above assessment and plan. ? ?Signed by: ?Vinie Sill, NP ?Palliative Medicine Team ?Pager # [phone] (M-F 8a-5p) ?Team Phone # [phone] (Nights/Weekends) ?  ? ? ? ? ? ? ? ? ? ? ? ? ?  ?

## 2021-10-14 NOTE — Assessment & Plan Note (Addendum)
TTE with LVEF of 20 to 25% (previously 30 to 35%), GH, G2-DD, severe LAE, moderate MVR.  He does not seem to be on diuretics on GDMT at home.  Appears euvolemic on exam.  ?-Changed amlodipine to low-dose Imdur and hydralazine ?-Consider outpatient follow-up with cardiology ? ?

## 2021-10-14 NOTE — NC FL2 (Signed)
?[place] MEDICAID FL2 LEVEL OF CARE SCREENING TOOL  ?  ? ?IDENTIFICATION  ?Patient Name: ?Todd Dunn Birthdate: 1940/08/26 Sex: male Admission Date (Current Location): ?10/12/2021  ?[place] and [place] Number: ? [place] ?[id number] [place] and Address:  ?[facility],  [place], [place] ?     Provider Number: ?[id number]  ?Attending Physician Name and Address:  ?Mercy Riding, MD ? Relative Name and Phone Number:  ?[name] [phone] ?   ?Current Level of Care: ?Hospital Recommended Level of Care: ?[place] Prior Approval Number: ?  ? ?Date Approved/Denied: ?  PASRR Number: ?[id number] A ? ?Discharge Plan: ?SNF ?  ? ?Current Diagnoses: ?Patient Active Problem List  ? Diagnosis Date Noted  ? Acute respiratory failure with hypoxia ([place]) 10/12/2021  ? Pressure injury of skin 09/27/2021  ? UTI due to extended-spectrum beta lactamase (ESBL) producing Escherichia coli 09/26/2021  ? Hypokalemia 09/26/2021  ? Dehydration 09/26/2021  ? 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 Right great toe amputee ([place]) 07/17/2021  ? CAP (community acquired pneumonia) 06/21/2021  ? Severe sepsis ([place]) 06/21/2021  ? SOB (shortness of breath) 06/21/2021  ? GERD (gastroesophageal reflux disease)   ? AMS (altered mental status) 05/11/2021  ? Hypotension 05/11/2021  ? Iron deficiency anemia 05/07/2021  ? Oral phase dysphagia 04/28/2021  ? Protein-calorie malnutrition, severe 03/27/2021  ? Toe infection   ? PVD (peripheral vascular disease) ([place])   ? Osteomyelitis of great toe of right foot ([place]) 03/20/2021  ? Wound infection 03/20/2021  ? Pacemaker 03/20/2021  ? Elevated alkaline phosphatase level 03/20/2021  ? Hyperlipidemia 03/20/2021  ? Hematuria 02/14/2021  ? Urinary tract infection associated with indwelling urethral catheter ([place])   ? Normocytic anemia 01/06/2021  ? Catheter-associated urinary tract infection ([place]) 12/15/2020  ? Toxic metabolic encephalopathy 20/25/4270  ? Chronic indwelling  Foley catheter 12/15/2020  ? HTN (hypertension) 12/15/2020  ? History of ESBL E. coli infection 12/15/2020  ? BPH (benign prostatic hyperplasia) 12/12/2019  ? Chronic systolic heart failure ([place]) 12/12/2019  ? ? ?Orientation RESPIRATION BLADDER Height & Weight   ?  ?Self ? Normal Continent Weight: 59 kg ?Height:  6' (182.9 cm)  ?BEHAVIORAL SYMPTOMS/MOOD NEUROLOGICAL BOWEL NUTRITION STATUS  ?    Incontinent Diet (regular)  ?AMBULATORY STATUS COMMUNICATION OF NEEDS Skin   ?Extensive Assist Verbally Normal ?  ?  ?  ?    ?     ?     ? ? ?Personal Care Assistance Level of Assistance  ?Bathing, Feeding, Dressing Bathing Assistance: Maximum assistance ?Feeding assistance: Maximum assistance ?Dressing Assistance: Maximum assistance ?   ? ?Functional Limitations Info  ?Sight, Hearing, Speech Sight Info: Adequate ?Hearing Info: Adequate ?Speech Info: Impaired (history of cva)  ? ? ?SPECIAL CARE FACTORS FREQUENCY  ?PT (By licensed PT), OT (By licensed OT)   ?  ?PT Frequency: 5 x weekly ?OT Frequency: 5 x weekly ?  ?  ?  ?   ? ? ?Contractures Contractures Info: Not present  ? ? ?Additional Factors Info  ?Code Status Code Status Info: full ?Allergies Info: some foods, asa, crestor,pencillins ?  ?  ?  ?   ? ?Current Medications (10/14/2021):  This is the current hospital active medication list ?Current Facility-Administered Medications  ?Medication Dose Route Frequency Provider Last Rate Last Admin  ? acetaminophen (TYLENOL) tablet 650 mg  650 mg Oral Q6H PRN Marcelyn Bruins, MD      ? Or  ? acetaminophen (TYLENOL) suppository 650 mg  650 mg Rectal Q6H PRN Marcelyn Bruins, MD      ? albuterol (PROVENTIL) (2.5 MG/3ML) 0.083% nebulizer solution 2.5 mg  2.5 mg Nebulization Q2H PRN Marcelyn Bruins, MD      ? amLODipine (NORVASC) tablet 10 mg  10 mg Oral Daily Marcelyn Bruins, MD   10 mg at 10/14/21 0849  ? ascorbic acid (VITAMIN C) tablet 500 mg  500 mg Oral BID Antonieta Pert, MD   500 mg at 10/14/21 0849  ? cefTRIAXone  (ROCEPHIN) 2 g in sodium chloride 0.9 % 100 mL IVPB  2 g Intravenous Q24H Kc, Ramesh, MD 200 mL/hr at 10/13/21 2202 2 g at 10/13/21 2202  ? divalproex (DEPAKOTE) DR tablet 125 mg  125 mg Oral QHS Marcelyn Bruins, MD   125 mg at 10/13/21 2153  ? enoxaparin (LOVENOX) injection 40 mg  40 mg Subcutaneous Q24H Marcelyn Bruins, MD   40 mg at 10/14/21 0850  ? feeding supplement (ENSURE ENLIVE / ENSURE PLUS) liquid 237 mL  237 mL Oral TID BM Marcelyn Bruins, MD   237 mL at 10/14/21 0856  ? nutrition supplement (JUVEN) (JUVEN) powder packet 1 packet  1 packet Oral BID BM Antonieta Pert, MD   1 packet at 10/13/21 1707  ? oxybutynin (DITROPAN) tablet 5 mg  5 mg Oral Q8H PRN Marcelyn Bruins, MD   5 mg at 10/13/21 1707  ? pantoprazole (PROTONIX) EC tablet 40 mg  40 mg Oral Daily Marcelyn Bruins, MD   40 mg at 10/14/21 0849  ? polyethylene glycol (MIRALAX / GLYCOLAX) packet 17 g  17 g Oral Daily PRN Marcelyn Bruins, MD      ? potassium chloride (KLOR-CON) packet 40 mEq  40 mEq Oral BID Marcelyn Bruins, MD   40 mEq at 10/14/21 0850  ? predniSONE (DELTASONE) tablet 20 mg  20 mg Oral Q breakfast Kc, Ramesh, MD   20 mg at 10/14/21 0849  ? QUEtiapine (SEROQUEL) tablet 50 mg  50 mg Oral BID Marcelyn Bruins, MD   50 mg at 10/14/21 0850  ? sodium chloride flush (NS) 0.9 % injection 3 mL  3 mL Intravenous Q12H Marcelyn Bruins, MD   3 mL at 10/14/21 3716  ? zinc sulfate capsule 220 mg  220 mg Oral Daily Antonieta Pert, MD   220 mg at 10/14/21 0849  ? ? ? ?Discharge Medications: ?Please see discharge summary for a list of discharge medications. ? ?Relevant Imaging Results: ? ?Relevant Lab Results: ? ? ?Additional Information ?[id number], 2 Covid Vaccines; 6' 130lbs ? ?Leeroy Cha, RN ? ? ? ? ?

## 2021-10-14 NOTE — Assessment & Plan Note (Addendum)
Exchanged Foley on 5/10. ?

## 2021-10-14 NOTE — Progress Notes (Addendum)
Exchange of Suprapubic catheter, per MD order, done today with Karrie Doffing, RN. 12Fr was used. Patient remains in stable condition. ?

## 2021-10-14 NOTE — Telephone Encounter (Signed)
Copied from [place] [phone]. Topic: General - Other ?>> Oct 14, 2021 10:03 AM McGill, Nelva Bush wrote: ?Reason for CRM: [facility] is calling to follow up on three home health orders that were faxed on 04/24, 04/25, and resent - 05/05. ? ?Needs confirmation that they were received. Juliann Pulse stated that it's about 8-16 pages. ? ?Fax [phone] and call back number ?

## 2021-10-14 NOTE — Assessment & Plan Note (Signed)
Nutrition Status: ?Nutrition Problem: Severe Malnutrition ?Etiology: chronic illness ?Signs/Symptoms: severe fat depletion, severe muscle depletion ?Interventions: Ensure Enlive (each supplement provides 350kcal and 20 grams of protein), Juven, MVI ?

## 2021-10-14 NOTE — Assessment & Plan Note (Signed)
Normotensive.  We will change cardiac meds as above ?

## 2021-10-14 NOTE — TOC Progression Note (Signed)
Transition of Care (TOC) - Progression Note  ? ? ?Patient Details  ?Name: Todd Dunn ?MRN: [id number] ?Date of Birth: 30-Nov-1940 ? ?Transition of Care (TOC) CM/SW Contact  ?Leeroy Cha, RN ?Phone Number: ?10/14/2021, 9:20 AM ? ?Clinical Narrative:    ?Tct-granddaughter sandra smith/does not want him going back to the snf that is on [place]/will send out of snf placement. ? ? ?Expected Discharge Plan: [place] ?Barriers to Discharge: Continued Medical Work up ? ?Expected Discharge Plan and Services ?Expected Discharge Plan: [place] ?  ?Discharge Planning Services: CM Consult ?Post Acute Care Choice: Home Health ?Living arrangements for the past 2 months: [place] ?                ?  ?  ?  ?  ?  ?  ?  ?  ?  ?  ? ? ?Social Determinants of Health (SDOH) Interventions ?  ? ?Readmission Risk Interventions ? ?  08/04/2021  ?  9:19 AM 06/24/2021  ? 11:26 AM 02/17/2021  ?  9:12 AM  ?Readmission Risk Prevention Plan  ?Transportation Screening Complete Complete Complete  ?Medication Review Press photographer) Complete Complete Complete  ?PCP or Specialist appointment within 3-5 days of discharge Complete Complete Complete  ?[place] or Home Care Consult Complete Complete Complete  ?SW Recovery Care/Counseling Consult Complete  Complete  ?Palliative Care Screening Not Applicable Not Applicable Not Applicable  ?Skilled Nursing Facility Complete Not Applicable Not Applicable  ? ? ?

## 2021-10-14 NOTE — Assessment & Plan Note (Addendum)
Reportedly desaturated to 70s when EMS arrived.  Unclear etiology but concern about possible COPD given improvement with low-dose prednisone.  CTA chest negative for PE or pulmonary finding but cardiomegaly.  He has known combined CHF. Respiratory failure resolved.  Saturating at 100% on RA. ?-Aspiration precaution ?-Continue albuterol as needed ?

## 2021-10-14 NOTE — Assessment & Plan Note (Signed)
Patient denies chest pain.  TTE as above.  Serial troponin negative. ?-Started Imdur as above. ?

## 2021-10-14 NOTE — Progress Notes (Signed)
Pharmacy Antibiotic Note ? ?Todd Dunn is a 81 y.o. male admitted on 10/12/2021 with UTI with history of ESBL infection.  Pharmacy has been consulted for Meropenem dosing. ? ?Plan: ?Meropenem 1g IV q8h  ?Follow up renal function, culture results, and clinical course. ? ? ?Height: 6' (182.9 cm) ?Weight: 59 kg (130 lb 1.1 oz) ?IBW/kg (Calculated) : 77.6 ? ?Temp (24hrs), Avg:98 ?F (36.7 ?C), Min:97.7 ?F (36.5 ?C), Max:98.4 ?F (36.9 ?C) ? ?Recent Labs  ?Lab 10/12/21 ?2020 10/13/21 ?9449 10/14/21 ?0450  ?WBC 5.1 3.6* 5.7  ?CREATININE 0.73 0.71 0.61  ?  ?Estimated Creatinine Clearance: 59.4 mL/min (by C-G formula based on SCr of 0.61 mg/dL).   ? ?Allergies  ?Allergen Reactions  ? Food Anaphylaxis and Rash  ?  Clams  ? Aspirin Itching  ? Crestor [Rosuvastatin Calcium] Other (See Comments)  ?  Muscle pain  ? [place] Clam (M. Mercenaria) Skin Test Itching  ? Penicillins Itching  ? ? ?Antimicrobials this admission: ?5/8 Ceftriaxone >> 5/10 ?5/10 meropenem >>  ? ?Dose adjustments this admission: ? ? ?Microbiology results: ?5/8 Resp panel: none detected ?5/8 UCx: Ecoli  ? ?Prior to admit: ?09/24/21 UCx:  ESBL Ecoli ? ? ? ?Thank you for allowing pharmacy to be a part of this patient?s care. ? ?Gretta Arab PharmD, BCPS ?Clinical Pharmacist ?[facility] main [facility] [phone] ?10/14/2021 3:14 PM ? ? ?

## 2021-10-14 NOTE — Assessment & Plan Note (Signed)
Stable  Continue home medication  

## 2021-10-14 NOTE — Assessment & Plan Note (Addendum)
Remains full code.  Palliative following. ?

## 2021-10-14 NOTE — Assessment & Plan Note (Addendum)
Stable. ?-PT/OT and fall precaution ?

## 2021-10-14 NOTE — Progress Notes (Signed)
?PROGRESS NOTE ? ?Todd Dunn RCV:[id number] DOB: Oct 17, 1940  ? ?PCP: [name], NP ? ?Patient is from: Home ? ?DOA: 10/12/2021 LOS: 1 ? ?Chief complaints ?Chief Complaint  ?Patient presents with  ? Shortness of Breath  ?  ? ?Brief Narrative / Interim history: ?81 year old M with PMH of dementia, CVA, aphasia, BPH/chronic Foley, combined CHF, CAD/CABG, PAD, SSS/PPM, right AKA and recent hospitalization for ESBL UTI, acute metabolic encephalopathy and severe dehydration presenting with shortness of breath, hypoxia to 70s and suprapubic pain, and admitted for acute respiratory failure with hypoxia and possible urinary tract infection.  Patient was discharged from SNF to home on the day of admission.  Reportedly saturating in the 70s on room air when EMS arrived.  CTA chest negative for PE or other pulmonary finding but cardiomegaly.  BNP 259.  RVP negative. ? ?Patient was started on ceftriaxone and low-dose prednisone.  Respiratory failure seems to have resolved.  Urine culture with E. coli.  Given previous ESBL UTI, changed ceftriaxone to meropenem pending culture sensitivity.  ? ?Subjective: ?Seen and examined earlier this morning.  No major events overnight or this morning.  No complaints but not a reliable historian.  He says he wants more food and does not.  He denies pain, shortness of breath or GI symptoms. ? ?Objective: ?Vitals:  ? 10/13/21 2036 10/14/21 0558 10/14/21 0837 10/14/21 1008  ?BP: (!) 111/49 120/60    ?Pulse: (!) 52 68    ?Resp: 18     ?Temp: 98.4 ?F (36.9 ?C) 98 ?F (36.7 ?C)    ?TempSrc:      ?SpO2: 100% 100% 100% 100%  ?Weight:      ?Height:      ? ? ?Examination: ? ?GENERAL: No apparent distress.  Nontoxic. ?HEENT: MMM.  Vision and hearing grossly intact.  ?NECK: Supple.  No apparent JVD.  ?RESP:  No IWOB.  Fair aeration bilaterally. ?CVS:  RRR. Heart sounds normal.  ?ABD/GI/GU: BS+. Abd soft, NTND.  ?MSK/EXT:  Moves extremities.  Right AKA.  Significant muscle mass and subcu fat loss  in LLE ?SKIN: no apparent skin lesion or wound ?NEURO: Awake, alert and oriented to self.  Follows commands.  Expressive aphasia.  No apparent focal neuro deficit. ?PSYCH: Calm. Normal affect.  ? ?Procedures:  ?None ? ?Microbiology summarized: ?COVID-19 and influenza PCR nonreactive. ?Full RVP nonreactive.   ?Urine culture with E. coli. ? ?Assessment and Plan: ?* Acute respiratory failure with hypoxia ([place]) ?Reportedly desaturated to 70s when EMS arrived.  No documented desaturation here but saturating in the low 90s to 100% on 2 L.  Unclear etiology.  CTA chest negative for PE or pulmonary finding but cardiomegaly.  He has known combined CHF.  He was a started on prednisone and ceftriaxone.  Fortunately, respiratory failure seems to be resolving.  ?-Continue low-dose prednisone ?-Wean off oxygen ? ?Catheter-associated urinary tract infection ([place]) ?Recently hospitalized for ESBL UTI and discharged to SNF.  Urine culture with E. coli again. ?-Change ceftriaxone to meropenem ?-Change suprapubic catheter ?-Follow culture sensitivity. ? ?History of CVA/aphasia ?Stable. ?-Continue home medication ? ?Chronic combined CHF ?TTE with LVEF of 20 to 25% (previously 30 to 35%), GH, G2-DD, severe LAE, moderate MVR.  He does not seem to be on diuretics on GDMT at home.  Appears euvolemic on exam. ?-Monitor fluid status ?-Change amlodipine to low-dose Imdur and hydralazine ? ? ?History of CAD/CABG ?Patient denies chest pain.  TTE as above.  Serial troponin negative. ?-Started Imdur as above. ? ?  S/P AKA (above knee amputation), right ([place]) ?Stable. ?-PT/OT eval ? ?Goals of care, counseling/discussion ?Discussed code status including pros and cons of CPR and intubation with patient's granddaughter, [name] over the phone.  Patient understands about patient's poor long-term prognosis given his advanced CHF, recurrent ESBL UTI, recurrent hospitalization and other comorbidities as above.  [name] would like to talk to the patient before  making decision.  She states she will be visiting today and she will let the staff know once she discuss this with patient.  She believes he can comprehend this conversation ?-Appreciate input by palliative medicine ? ?Protein-calorie malnutrition, severe ?Nutrition Status: ?Nutrition Problem: Severe Malnutrition ?Etiology: chronic illness ?Signs/Symptoms: severe fat depletion, severe muscle depletion ?Interventions: Ensure Enlive (each supplement provides 350kcal and 20 grams of protein), Juven, MVI ? ?HTN (hypertension) ?Normotensive.  We will change cardiac meds as above ? ?Chronic indwelling Foley catheter ?Will exchange Foley. ? ? ? ?DVT prophylaxis:  ?enoxaparin (LOVENOX) injection 40 mg Start: 10/13/21 1000 ? ?Code Status: Full code ?Family Communication: Updated patient's granddaughter, [name] over the phone. ?Level of care: Telemetry ?Status is: Inpatient ?Remains inpatient appropriate because: [place] associated UTI requiring IV antibiotics ? ? ?Final disposition: SNF? ?Consultants:  ?None ? ?Sch Meds:  ?Scheduled Meds: ? amLODipine  10 mg Oral Daily  ? vitamin C  500 mg Oral BID  ? divalproex  125 mg Oral QHS  ? enoxaparin (LOVENOX) injection  40 mg Subcutaneous Q24H  ? feeding supplement  237 mL Oral TID BM  ? nutrition supplement (JUVEN)  1 packet Oral BID BM  ? pantoprazole  40 mg Oral Daily  ? potassium chloride  40 mEq Oral BID  ? predniSONE  20 mg Oral Q breakfast  ? QUEtiapine  50 mg Oral BID  ? sodium chloride flush  3 mL Intravenous Q12H  ? zinc sulfate  220 mg Oral Daily  ? ?Continuous Infusions: ? meropenem (MERREM) IV    ? ?PRN Meds:.acetaminophen **OR** acetaminophen, albuterol, oxybutynin, polyethylene glycol ? ?Antimicrobials: ?Anti-infectives (From admission, onward)  ? ? Start     Dose/Rate Route Frequency Ordered Stop  ? 10/14/21 1600  meropenem (MERREM) 1 g in sodium chloride 0.9 % 100 mL IVPB       ? 1 g ?200 mL/hr over 30 Minutes Intravenous Every 8 hours 10/14/21 1517    ? 10/13/21  2000  cefTRIAXone (ROCEPHIN) 2 g in sodium chloride 0.9 % 100 mL IVPB  Status:  Discontinued       ? 2 g ?200 mL/hr over 30 Minutes Intravenous Every 24 hours 10/13/21 1540 10/14/21 1501  ? 10/12/21 2145  cefTRIAXone (ROCEPHIN) 2 g in sodium chloride 0.9 % 100 mL IVPB       ? 2 g ?200 mL/hr over 30 Minutes Intravenous  Once 10/12/21 2139 10/12/21 2247  ? ?  ? ? ? ?I have personally reviewed the following labs and images: ?CBC: ?Recent Labs  ?Lab 10/12/21 ?2020 10/13/21 ?8502 10/14/21 ?0450  ?WBC 5.1 3.6* 5.7  ?HGB 13.3 13.0 12.4*  ?HCT 41.8 41.8 40.8  ?MCV 90.7 89.7 90.9  ?PLT 207 217 210  ? ?BMP &GFR ?Recent Labs  ?Lab 10/12/21 ?2020 10/12/21 ?2205 10/13/21 ?7741 10/14/21 ?0450  ?NA 138  --  141 140  ?K 3.2*  --  3.8 4.7  ?CL 105  --  106 105  ?CO2 27  --  31 29  ?GLUCOSE 117*  --  112* 107*  ?BUN 15  --  11 16  ?  CREATININE 0.73  --  0.71 0.61  ?CALCIUM 10.0  --  9.9 10.5*  ?MG  --  2.0  --   --   ? ?Estimated Creatinine Clearance: 59.4 mL/min (by C-G formula based on SCr of 0.61 mg/dL). ?Liver & Pancreas: ?Recent Labs  ?Lab 10/13/21 ?1610  ?AST 15  ?ALT 15  ?ALKPHOS 138*  ?BILITOT 0.4  ?PROT 6.7  ?ALBUMIN 3.1*  ? ?No results for input(s): LIPASE, AMYLASE in the last 168 hours. ?No results for input(s): AMMONIA in the last 168 hours. ?Diabetic: ?No results for input(s): HGBA1C in the last 72 hours. ?No results for input(s): GLUCAP in the last 168 hours. ?Cardiac Enzymes: ?No results for input(s): CKTOTAL, CKMB, CKMBINDEX, TROPONINI in the last 168 hours. ?No results for input(s): PROBNP in the last 8760 hours. ?Coagulation Profile: ?No results for input(s): INR, PROTIME in the last 168 hours. ?Thyroid Function Tests: ?No results for input(s): TSH, T4TOTAL, FREET4, T3FREE, THYROIDAB in the last 72 hours. ?Lipid Profile: ?No results for input(s): CHOL, HDL, LDLCALC, TRIG, CHOLHDL, LDLDIRECT in the last 72 hours. ?Anemia Panel: ?No results for input(s): VITAMINB12, FOLATE, FERRITIN, TIBC, IRON, RETICCTPCT in the last  72 hours. ?Urine analysis: ?   ?Component Value Date/Time  ? [place] YELLOW 10/12/2021 2047  ? APPEARANCEUR CLOUDY (A) 10/12/2021 2047  ? LABSPEC 1.005 10/12/2021 2047  ? PHURINE 6.0 10/12/2021 20

## 2021-10-15 DIAGNOSIS — T83511A Infection and inflammatory reaction due to indwelling urethral catheter, initial encounter: Secondary | ICD-10-CM | POA: Diagnosis not present

## 2021-10-15 DIAGNOSIS — Z7189 Other specified counseling: Secondary | ICD-10-CM | POA: Diagnosis not present

## 2021-10-15 DIAGNOSIS — I5042 Chronic combined systolic (congestive) and diastolic (congestive) heart failure: Secondary | ICD-10-CM | POA: Diagnosis not present

## 2021-10-15 DIAGNOSIS — J9601 Acute respiratory failure with hypoxia: Secondary | ICD-10-CM | POA: Diagnosis not present

## 2021-10-15 DIAGNOSIS — R4189 Other symptoms and signs involving cognitive functions and awareness: Secondary | ICD-10-CM

## 2021-10-15 DIAGNOSIS — Z515 Encounter for palliative care: Secondary | ICD-10-CM | POA: Diagnosis not present

## 2021-10-15 DIAGNOSIS — Z8673 Personal history of transient ischemic attack (TIA), and cerebral infarction without residual deficits: Secondary | ICD-10-CM | POA: Diagnosis not present

## 2021-10-15 LAB — RENAL FUNCTION PANEL
Albumin: 3 g/dL — ABNORMAL LOW (ref 3.5–5.0)
Anion gap: 4 — ABNORMAL LOW (ref 5–15)
BUN: 25 mg/dL — ABNORMAL HIGH (ref 8–23)
CO2: 28 mmol/L (ref 22–32)
Calcium: 10.7 mg/dL — ABNORMAL HIGH (ref 8.9–10.3)
Chloride: 106 mmol/L (ref 98–111)
Creatinine, Ser: 0.63 mg/dL (ref 0.61–1.24)
GFR, Estimated: >60 mL/min (ref >60)
Glucose, Bld: 112 mg/dL — ABNORMAL HIGH (ref 70–99)
Phosphorus: 2.5 mg/dL (ref 2.5–4.6)
Potassium: 4.8 mmol/L (ref 3.5–5.1)
Sodium: 138 mmol/L (ref 135–145)

## 2021-10-15 LAB — MAGNESIUM: Magnesium: 2.1 mg/dL (ref 1.7–2.4)

## 2021-10-15 LAB — CBC
HCT: 38.6 % — ABNORMAL LOW (ref 39.0–52.0)
Hemoglobin: 12 g/dL — ABNORMAL LOW (ref 13.0–17.0)
MCH: 28.2 pg (ref 26.0–34.0)
MCHC: 31.1 g/dL (ref 30.0–36.0)
MCV: 90.8 fL (ref 80.0–100.0)
Platelets: 199 K/uL (ref 150–400)
RBC: 4.25 MIL/uL (ref 4.22–5.81)
RDW: 15.6 % — ABNORMAL HIGH (ref 11.5–15.5)
WBC: 7.1 K/uL (ref 4.0–10.5)
nRBC: 0 % (ref 0.0–0.2)

## 2021-10-15 MED ORDER — RIVAROXABAN 10 MG PO TABS
10.0000 mg | ORAL_TABLET | Freq: Every day | ORAL | Status: DC
  Administered 2021-10-15 – 2021-10-19 (×5): 10 mg via ORAL
  Filled 2021-10-15 (×5): qty 1

## 2021-10-15 MED ORDER — ISOSORBIDE MONONITRATE ER 30 MG PO TB24
15.0000 mg | ORAL_TABLET | Freq: Every day | ORAL | Status: DC
  Administered 2021-10-16 – 2021-10-19 (×4): 15 mg via ORAL
  Filled 2021-10-15 (×4): qty 1

## 2021-10-15 MED ORDER — OXYBUTYNIN CHLORIDE ER 5 MG PO TB24
5.0000 mg | ORAL_TABLET | Freq: Every day | ORAL | Status: DC
  Administered 2021-10-15 – 2021-10-18 (×4): 5 mg via ORAL
  Filled 2021-10-15 (×4): qty 1

## 2021-10-15 MED ORDER — HYDRALAZINE HCL 10 MG PO TABS
10.0000 mg | ORAL_TABLET | Freq: Three times a day (TID) | ORAL | Status: DC
  Administered 2021-10-16 – 2021-10-19 (×11): 10 mg via ORAL
  Filled 2021-10-15 (×11): qty 1

## 2021-10-15 NOTE — Progress Notes (Signed)
Notified on call provider that patient's O2 had to be increased to 5 liters per nasal cannula due to patient's O2 sats dropping down to 69%. Once patient was on 5 liters, patient's O2 sats came up to 94%. ?

## 2021-10-15 NOTE — Plan of Care (Signed)

## 2021-10-15 NOTE — Assessment & Plan Note (Signed)
See delirium. ?

## 2021-10-15 NOTE — Progress Notes (Signed)
?PROGRESS NOTE ? ?Todd Dunn GGE:[id number] DOB: 1941/01/26  ? ?PCP: [name], NP ? ?Patient is from: Home ? ?DOA: 10/12/2021 LOS: 2 ? ?Chief complaints ?Chief Complaint  ?Patient presents with  ? Shortness of Breath  ?  ? ?Brief Narrative / Interim history: ?81 year old M with PMH of dementia, CVA, aphasia, BPH/chronic Foley, combined CHF, CAD/CABG, PAD, SSS/PPM, right AKA and recent hospitalization for ESBL UTI, acute metabolic encephalopathy and severe dehydration presenting with shortness of breath, hypoxia to 70s and suprapubic pain, and admitted for acute respiratory failure with hypoxia and possible urinary tract infection.  Patient was discharged from SNF to home on the day of admission.  Reportedly saturating in the 70s on room air when EMS arrived.  CTA chest negative for PE or other pulmonary finding but cardiomegaly.  BNP 259.  RVP negative. ? ?Patient was started on ceftriaxone and low-dose prednisone.  Respiratory failure seems to have resolved.  Urine culture with E. Coli and Pseudomonas aeruginosa.  Given previous ESBL UTI, changed ceftriaxone to meropenem pending culture sensitivity. ? ?Patient seems to have delirium refusing care and hitting at staff at times.  ? ?Subjective: ?Seen and examined earlier this morning.  Reportedly confused and refusing medications and care, and hearing are stopped.  He is sleepy but wakes to voice.  He is oriented to self but not place and time.  He does not follow command consistently.  Easily aggravated and agitated. ? ?Objective: ?Vitals:  ? 10/15/21 0609 10/15/21 0648 10/15/21 0800 10/15/21 1456  ?BP: (!) 123/109 114/63  (!) 105/46  ?Pulse: 77   60  ?Resp: 20   17  ?Temp: 98 ?F (36.7 ?C)   97.7 ?F (36.5 ?C)  ?TempSrc:    Oral  ?SpO2: 100%  100% (!) 88%  ?Weight:      ?Height:      ? ? ?Examination: ? ?GENERAL: Frail and chronically ill-appearing. ?HEENT: MMM.  Vision and hearing grossly intact.  ?NECK: Supple.  No apparent JVD.  ?RESP:  No IWOB.  Fair  aeration bilaterally. ?CVS:  RRR. Heart sounds normal.  ?ABD/GI/GU: BS+. Abd soft, NTND.  Suprapubic Foley.  Clear looking urine in Foley bag. ?MSK/EXT:  Moves extremities.  Right AKA.  Significant muscle mass and subcu fat loss. ?SKIN: no apparent skin lesion or wound ?NEURO: Sleepy but wakes to voice.  Oriented to self but not place or time.  No apparent focal neuro deficit but limited exam as patient is not cooperative ?PSYCH: Easily agitated and aggravated ? ?Procedures:  ?None ? ?Microbiology summarized: ?COVID-19 and influenza PCR nonreactive. ?Full RVP nonreactive.   ?Urine culture with E. Coli and Pseudomonas aeruginosa.  Sensitivity pending. ? ?Assessment and Plan: ?* Acute respiratory failure with hypoxia ([place]) ?Reportedly desaturated to 70s when EMS arrived.  No documented desaturation here but saturating in the low 90s to 100% on 2 L.  Unclear etiology.  CTA chest negative for PE or pulmonary finding but cardiomegaly.  He has known combined CHF.  He was a started on prednisone and ceftriaxone.  Respiratory failure seems to have resolved. ?-Continue low-dose prednisone ?-On IV meropenem for possible UTI. ?-Wean off oxygen ? ?Delirium in patient with underlying cognitive impairment ?Refusing care and hitting her staff at times. ?-Treat treatable causes ?-Delirium precaution and reorientation ?-Continue Depakote ?-Discontinue telemetry ? ?Catheter-associated urinary tract infection ([place]) ?Recently hospitalized for ESBL UTI and discharged to SNF.  Urine culture with E. coli and Pseudomonas aeruginosa.  Difficult to tell if this is true  UTI or colonization from chronic Foley but patient seems to have mental status change.  ?-Changed ceftriaxone to meropenem on 5/10 ?-Changed suprapubic catheter on 5/10 ?-Follow culture sensitivity. ? ?History of CVA/aphasia ?Stable. ?-Continue home medication ? ?Chronic combined CHF ?TTE with LVEF of 20 to 25% (previously 30 to 35%), GH, G2-DD, severe LAE, moderate MVR.  He  does not seem to be on diuretics on GDMT at home.  Appears euvolemic on exam. ?-Monitor fluid status ?-Change amlodipine to low-dose Imdur and hydralazine ? ? ?History of CAD/CABG ?Patient denies chest pain.  TTE as above.  Serial troponin negative. ?-Started Imdur as above. ? ?S/P AKA (above knee amputation), right ([place]) ?Stable. ?-PT/OT eval ? ?Cognitive impairment ?See delirium. ? ?Goals of care, counseling/discussion ?Remains full code.  Palliative following. ? ?Protein-calorie malnutrition, severe ?Nutrition Status: ?Nutrition Problem: Severe Malnutrition ?Etiology: chronic illness ?Signs/Symptoms: severe fat depletion, severe muscle depletion ?Interventions: Ensure Enlive (each supplement provides 350kcal and 20 grams of protein), Juven, MVI ? ?HTN (hypertension) ?Normotensive.  We will change cardiac meds as above ? ?Chronic indwelling Foley catheter ?Will exchange Foley. ? ? ? ?DVT prophylaxis:  ?rivaroxaban (XARELTO) tablet 10 mg Start: 10/15/21 1400 ?rivaroxaban (XARELTO) tablet 10 mg  ?Code Status: Full code ?Family Communication: Updated patient's granddaughter, [name] over the phone on 5/20 ?Level of care: Telemetry ?Status is: Inpatient ?Remains inpatient appropriate because: Catheter associated polymicrobial UTI requiring IV antibiotics and acute encephalopathy/delirium and agitation ? ? ?Final disposition: SNF ?Consultants:  ?Palliative medicine ? ?Sch Meds:  ?Scheduled Meds: ? vitamin C  500 mg Oral BID  ? divalproex  125 mg Oral QHS  ? feeding supplement  237 mL Oral TID BM  ? [START ON 10/16/2021] hydrALAZINE  10 mg Oral Q8H  ? [START ON 10/16/2021] isosorbide mononitrate  15 mg Oral Daily  ? nutrition supplement (JUVEN)  1 packet Oral BID BM  ? oxybutynin  5 mg Oral QHS  ? pantoprazole  40 mg Oral Daily  ? potassium chloride  40 mEq Oral BID  ? predniSONE  20 mg Oral Q breakfast  ? QUEtiapine  50 mg Oral BID  ? rivaroxaban  10 mg Oral Daily  ? sodium chloride flush  3 mL Intravenous Q12H  ? zinc  sulfate  220 mg Oral Daily  ? ?Continuous Infusions: ? meropenem (MERREM) IV 1 g (10/15/21 0816)  ? ?PRN Meds:.acetaminophen **OR** acetaminophen, albuterol, polyethylene glycol ? ?Antimicrobials: ?Anti-infectives (From admission, onward)  ? ? Start     Dose/Rate Route Frequency Ordered Stop  ? 10/14/21 1600  meropenem (MERREM) 1 g in sodium chloride 0.9 % 100 mL IVPB       ? 1 g ?200 mL/hr over 30 Minutes Intravenous Every 8 hours 10/14/21 1517    ? 10/13/21 2000  cefTRIAXone (ROCEPHIN) 2 g in sodium chloride 0.9 % 100 mL IVPB  Status:  Discontinued       ? 2 g ?200 mL/hr over 30 Minutes Intravenous Every 24 hours 10/13/21 1540 10/14/21 1501  ? 10/12/21 2145  cefTRIAXone (ROCEPHIN) 2 g in sodium chloride 0.9 % 100 mL IVPB       ? 2 g ?200 mL/hr over 30 Minutes Intravenous  Once 10/12/21 2139 10/12/21 2247  ? ?  ? ? ? ?I have personally reviewed the following labs and images: ?CBC: ?Recent Labs  ?Lab 10/12/21 ?2020 10/13/21 ?0454 10/14/21 ?0450 10/15/21 ?1610  ?WBC 5.1 3.6* 5.7 7.1  ?HGB 13.3 13.0 12.4* 12.0*  ?HCT 41.8 41.8 40.8 38.6*  ?  MCV 90.7 89.7 90.9 90.8  ?PLT 207 217 210 199  ? ?BMP &GFR ?Recent Labs  ?Lab 10/12/21 ?2020 10/12/21 ?2205 10/13/21 ?5035 10/14/21 ?0450 10/15/21 ?4656  ?NA 138  --  141 140 138  ?K 3.2*  --  3.8 4.7 4.8  ?CL 105  --  106 105 106  ?CO2 27  --  '31 29 28  '$ ?GLUCOSE 117*  --  112* 107* 112*  ?BUN 15  --  11 16 25*  ?CREATININE 0.73  --  0.71 0.61 0.63  ?CALCIUM 10.0  --  9.9 10.5* 10.7*  ?MG  --  2.0  --   --  2.1  ?PHOS  --   --   --   --  2.5  ? ?Estimated Creatinine Clearance: 50.3 mL/min (by C-G formula based on SCr of 0.63 mg/dL). ?Liver & Pancreas: ?Recent Labs  ?Lab 10/13/21 ?0454 10/15/21 ?8127  ?AST 15  --   ?ALT 15  --   ?ALKPHOS 138*  --   ?BILITOT 0.4  --   ?PROT 6.7  --   ?ALBUMIN 3.1* 3.0*  ? ?No results for input(s): LIPASE, AMYLASE in the last 168 hours. ?No results for input(s): AMMONIA in the last 168 hours. ?Diabetic: ?No results for input(s): HGBA1C in the last 72  hours. ?No results for input(s): GLUCAP in the last 168 hours. ?Cardiac Enzymes: ?No results for input(s): CKTOTAL, CKMB, CKMBINDEX, TROPONINI in the last 168 hours. ?No results for input(s): PROBNP in t

## 2021-10-15 NOTE — Progress Notes (Signed)
Palliative: ? ?HPI: 81 y.o. male  with past medical history of dementia, chronic foley, history of ESBL UTI, hypertension, anemia, sick sinus syndrome post pacemaker, hyperlipidemia, PAD, CAD s/p CABG, CVA with chronic aphasia, BPH, CHF, GERD admitted on 10/12/2021 with shortness of breath with acute on chronic respiratory failure.   ? ?I came to Todd Dunn's bedside but he is sleeping and does not awaken by my presence twice. Per nursing he has been agitated and uncooperative today so I did not make further attempts to awaken and engage with him. I called and spoke with granddaughter Todd Dunn. Todd Dunn and I spoke about her grandfather's condition today and she shares that he can be difficult and uncooperative at times but he seemed himself when family have been at bedside. Todd Dunn shares that he becomes very frustrated as he cannot communicate his discomforts but family understands him more as they know him better. Todd Dunn reports that she had a conversation with him last night about his wishes for resuscitation. She reviewed with me her explanation which was very good and appropriate. She reports that he was very quick to say "save me." We both agree that there would be benefits to having this conversation outside the hospital and ideally at home when he is less agitated and can have a better conversation. Todd Dunn expresses understanding and understands the risks vs benefits of resuscitation. She agrees with outpatient palliative to follow. Todd Dunn also enquires about obtaining formal HCPOA. I educated that I will leave copy in his room for her to review with him and I will ask our spiritual care service to review to be notarized tomorrow if his condition allows for this. I will ask them to reach out to Todd Dunn if there are issues or he is uncooperative. Todd Dunn will be at work but will be available by phone as needed.  ? ?All questions/concerns addressed. Emotional support provided.  ? ?Exam: Sleeping. Agitated when  awake. Thin, frail. No distress. Moves all extremities. I did not awaken today.  ? ?Plan: ?- Full code, full scope at current time.  ?- Outpatient palliative to continue conversation.  ? ?25 min ? ?Vinie Sill, NP ?Palliative Medicine Team ?Pager [phone] (Please see [email or web address] for schedule) ?Team Phone [phone]  ? ? ?Greater than 50%  of this time was spent counseling and coordinating care related to the above assessment and plan   ?

## 2021-10-15 NOTE — Progress Notes (Signed)
PHARMACY NOTE -  Meropenem ? ?Pharmacy has been assisting with dosing of meropenem for suspected MDR UTI. ?Dosage remains stable at 1g IV q8 hr and further renal adjustments  ? ?Pharmacy will sign off, following peripherally for culture results or dose adjustments. Please reconsult if a change in clinical status warrants re-evaluation of dosage. ? ?Reuel Boom, PharmD, BCPS ?[phone] ?10/15/2021, 12:40 PM ? ? ? ?

## 2021-10-15 NOTE — Progress Notes (Signed)
Attempted a few times to check patient's O2 sats per family request, but patient kept getting agitated and started slapping and swatting at nurse. Nurse left patient alone to avoid further agitation. ?

## 2021-10-15 NOTE — Assessment & Plan Note (Addendum)
Seems to have resolved. ?-Delirium precaution and reorientation ?-Continue Depakote and Seroquel ?

## 2021-10-16 DIAGNOSIS — I5042 Chronic combined systolic (congestive) and diastolic (congestive) heart failure: Secondary | ICD-10-CM | POA: Diagnosis not present

## 2021-10-16 DIAGNOSIS — R410 Disorientation, unspecified: Secondary | ICD-10-CM

## 2021-10-16 DIAGNOSIS — T83511A Infection and inflammatory reaction due to indwelling urethral catheter, initial encounter: Secondary | ICD-10-CM | POA: Diagnosis not present

## 2021-10-16 DIAGNOSIS — J9601 Acute respiratory failure with hypoxia: Secondary | ICD-10-CM | POA: Diagnosis not present

## 2021-10-16 LAB — RENAL FUNCTION PANEL
Albumin: 3.1 g/dL — ABNORMAL LOW (ref 3.5–5.0)
Anion gap: 4 — ABNORMAL LOW (ref 5–15)
BUN: 27 mg/dL — ABNORMAL HIGH (ref 8–23)
CO2: 29 mmol/L (ref 22–32)
Calcium: 10.3 mg/dL (ref 8.9–10.3)
Chloride: 105 mmol/L (ref 98–111)
Creatinine, Ser: 0.59 mg/dL — ABNORMAL LOW (ref 0.61–1.24)
GFR, Estimated: >60 mL/min (ref >60)
Glucose, Bld: 129 mg/dL — ABNORMAL HIGH (ref 70–99)
Phosphorus: 2.2 mg/dL — ABNORMAL LOW (ref 2.5–4.6)
Potassium: 4.4 mmol/L (ref 3.5–5.1)
Sodium: 138 mmol/L (ref 135–145)

## 2021-10-16 LAB — CBC
HCT: 40.1 % (ref 39.0–52.0)
Hemoglobin: 12.3 g/dL — ABNORMAL LOW (ref 13.0–17.0)
MCH: 28.1 pg (ref 26.0–34.0)
MCHC: 30.7 g/dL (ref 30.0–36.0)
MCV: 91.6 fL (ref 80.0–100.0)
Platelets: 182 10*3/uL (ref 150–400)
RBC: 4.38 MIL/uL (ref 4.22–5.81)
RDW: 15.5 % (ref 11.5–15.5)
WBC: 5.1 10*3/uL (ref 4.0–10.5)
nRBC: 0 % (ref 0.0–0.2)

## 2021-10-16 LAB — MAGNESIUM: Magnesium: 2.2 mg/dL (ref 1.7–2.4)

## 2021-10-16 MED ORDER — ISOSORBIDE MONONITRATE ER 30 MG PO TB24: 15.0000 mg | ORAL_TABLET | Freq: Every day | ORAL | Status: DC

## 2021-10-16 MED ORDER — HYDRALAZINE HCL 10 MG PO TABS: 10.0000 mg | ORAL_TABLET | Freq: Three times a day (TID) | ORAL | Status: DC

## 2021-10-16 MED ORDER — FOSFOMYCIN TROMETHAMINE 3 G PO PACK
3.0000 g | PACK | Freq: Once | ORAL | Status: AC
  Administered 2021-10-16: 3 g via ORAL
  Filled 2021-10-16: qty 3

## 2021-10-16 MED ORDER — ACETAMINOPHEN 500 MG PO TABS
1000.0000 mg | ORAL_TABLET | Freq: Three times a day (TID) | ORAL | 0 refills | Status: AC | PRN
Start: 2021-10-16 — End: 2021-10-26

## 2021-10-16 MED ORDER — ZINC SULFATE 220 (50 ZN) MG PO CAPS: 220.0000 mg | ORAL_CAPSULE | Freq: Every day | ORAL | Status: DC

## 2021-10-16 MED ORDER — ASCORBIC ACID 500 MG PO TABS: 500.0000 mg | ORAL_TABLET | Freq: Two times a day (BID) | ORAL | Status: DC

## 2021-10-16 MED ORDER — ORAL CARE MOUTH RINSE
15.0000 mL | Freq: Two times a day (BID) | OROMUCOSAL | Status: DC
  Administered 2021-10-16 – 2021-10-19 (×7): 15 mL via OROMUCOSAL

## 2021-10-16 NOTE — Progress Notes (Signed)
Physical Therapy Treatment ?Patient Details ?Name: Todd Dunn ?MRN: [id number] ?DOB: [birth year] ?Today's Date: 10/16/2021 ? ? ?History of Present Illness Patient is a 81 y.o. male who presented with worsening of suprapubic abdominal pain and SOB PMH: indwelling suprapubic urinary catheter secondary to severe BPH, CAD, PVD status post right AKA, SSS status post ICD, HTN, CVA with chronic aphasia, ? ?  ?PT Comments  ? ? Patient progressing nicely and able to follow cues/commands ~50% of the time this date. 2+ Mod assist to rise and pivot to recliner from EOB. Continue to recommend ST rehab at SNF to progress mobility.   ?Recommendations for follow up therapy are one component of a multi-disciplinary discharge planning process, led by the attending physician.  Recommendations may be updated based on patient status, additional functional criteria and insurance authorization. ? ?Follow Up Recommendations ? Skilled nursing-short term rehab (<3 hours/day) ?  ?  ?Assistance Recommended at Discharge Frequent or constant Supervision/Assistance  ?Patient can return home with the following Two people to help with walking and/or transfers;Two people to help with bathing/dressing/bathroom;Assistance with cooking/housework;Assistance with feeding;Direct supervision/assist for financial management;Direct supervision/assist for medications management;Help with stairs or ramp for entrance;Assist for transportation ?  ?Equipment Recommendations ? Hospital bed;Wheelchair cushion (measurements PT);Wheelchair (measurements PT)  ?  ?Recommendations for Other Services   ? ? ?  ?Precautions / Restrictions Precautions ?Precautions: Fall ?Precaution Comments: suprapubic cath ?Restrictions ?Weight Bearing Restrictions: No ?Other Position/Activity Restrictions: R AKA  ?  ? ?Mobility ? Bed Mobility ?Overal bed mobility: Needs Assistance ?Bed Mobility: Rolling ?Rolling: Mod assist, +2 for physical assistance, +2 for safety/equipment ?  ?  ?   ?  ?General bed mobility comments: Mod assist +2 to roll in bed and raise trunk fully. Pt required assist for hand placement to maintain balance. ?  ? ?Transfers ?Overall transfer level: Needs assistance ?Equipment used: 2 person hand held assist ?Transfers: Sit to/from Stand, Bed to chair/wheelchair/BSC ?Sit to Stand: +2 physical assistance, +2 safety/equipment, Mod assist ?Stand pivot transfers: Mod assist, +2 safety/equipment, +2 physical assistance ?  ?  ?  ?  ?General transfer comment: +2 Mod asiist to power up and pivot from EOB to recliner. pt requires +2 for balance support due to Rt AKA but overall good strength to rise. ?  ? ?Ambulation/Gait ?  ?  ?  ?  ?  ?  ?  ?  ? ? ?Stairs ?  ?  ?  ?  ?  ? ? ?Wheelchair Mobility ?  ? ?Modified Rankin (Stroke Patients Only) ?  ? ? ?  ?Balance Overall balance assessment: Needs assistance ?Sitting-balance support: Feet supported, Bilateral upper extremity supported ?Sitting balance-Leahy Scale: Poor ?Sitting balance - Comments: min guard up to modA for sitting balance due to impulsivity ?  ?Standing balance support: Bilateral upper extremity supported ?Standing balance-Leahy Scale: Zero ?  ?  ?  ?  ?  ?  ?  ?  ?  ?  ?  ?  ?  ? ?  ?Cognition Arousal/Alertness: Awake/alert ?Behavior During Therapy: Restless, Impulsive ?Overall Cognitive Status: No family/caregiver present to determine baseline cognitive functioning ?Area of Impairment: Orientation, Following commands, Problem solving, Safety/judgement ?  ?  ?  ?  ?  ?  ?  ?  ?Orientation Level: Disoriented to, Place, Time, Situation ?  ?  ?Following Commands: Follows one step commands inconsistently ?Safety/Judgement: Decreased awareness of safety, Decreased awareness of deficits ?  ?Problem Solving: Slow processing, Decreased initiation, Difficulty sequencing, Requires verbal  cues, Requires tactile cues ?General Comments: mumbled speech throughout pt difficult to understand, follows commands ~25% of time ?  ?  ? ?   ?Exercises   ? ?  ?General Comments   ?  ?  ? ?Pertinent Vitals/Pain Pain Assessment ?Faces Pain Scale: No hurt ?Breathing: normal ?Negative Vocalization: none ?Facial Expression: smiling or inexpressive ?Body Language: relaxed ?Consolability: no need to console ?PAINAD Score: 0 ?Pain Intervention(s): Monitored during session, Repositioned, Limited activity within patient's tolerance  ? ? ?Home Living   ?  ?  ?  ?  ?  ?  ?  ?  ?  ?   ?  ?Prior Function    ?  ?  ?   ? ?PT Goals (current goals can now be found in the care plan section) Acute Rehab PT Goals ?Patient Stated Goal: unable to state; asking for food and water throughout session. ?PT Goal Formulation: Patient unable to participate in goal setting ?Time For Goal Achievement: 10/27/21 ?Potential to Achieve Goals: Fair ?Progress towards PT goals: Progressing toward goals ? ?  ?Frequency ? ? ? Min 2X/week ? ? ? ?  ?PT Plan    ? ? ?Co-evaluation PT/OT/SLP Co-Evaluation/Treatment: Yes ?Reason for Co-Treatment: For patient/therapist safety;To address functional/ADL transfers ?PT goals addressed during session: Mobility/safety with mobility;Balance ?OT goals addressed during session: ADL's and self-care;Strengthening/ROM ?  ? ?  ?AM-PAC PT "6 Clicks" Mobility   ?Outcome Measure ? Help needed turning from your back to your side while in a flat bed without using bedrails?: Total ?Help needed moving from lying on your back to sitting on the side of a flat bed without using bedrails?: Total ?Help needed moving to and from a bed to a chair (including a wheelchair)?: Total ?Help needed standing up from a chair using your arms (e.g., wheelchair or bedside chair)?: Total ?Help needed to walk in hospital room?: Total ?Help needed climbing 3-5 steps with a railing? : Total ?6 Click Score: 6 ? ?  ?End of Session Equipment Utilized During Treatment: Gait belt ?Activity Tolerance: Patient tolerated treatment well ?Patient left: in bed;with call bell/phone within reach;with bed  alarm set;with restraints reapplied ?Nurse Communication: Mobility status ?PT Visit Diagnosis: Other abnormalities of gait and mobility (R26.89) ?  ? ? ?Time: 2800-3491 ?PT Time Calculation (min) (ACUTE ONLY): 17 min ? ?Charges:             ?          ? ?Gwynneth Albright PT, DPT ?Acute Rehabilitation Services ?Office [phone] ?Pager [phone]  ? ? ?Jacques Navy ?10/16/2021, 4:52 PM ? ?

## 2021-10-16 NOTE — Discharge Summary (Signed)
? ?Physician Discharge Summary  ?Todd Dunn TFT:[id number] DOB: March 08, 1941 DOA: 10/12/2021 ? ?PCP: [name], NP ? ?Admit date: 10/12/2021 ?Discharge date: 10/16/2021 ?Admitted From: SNF ?Disposition: SNF ?Recommendations for Outpatient Follow-up:  ?Follow ups as below. ?Please obtain CBC/BMP/Mag at follow up ?Outpatient follow-up with cardiology in 1 to 2 weeks ?Please follow up on the following pending results: None ? ? ?Discharge Condition: Stable but guarded prognosis ?CODE STATUS: Full code ? ? ?Hospital course ?81 year old M with PMH of dementia, CVA, aphasia, BPH/chronic Foley, combined CHF, CAD/CABG, PAD, SSS/PPM, right AKA and recent hospitalization for ESBL UTI, acute metabolic encephalopathy and severe dehydration presenting with shortness of breath, hypoxia to 70s and suprapubic pain, and admitted for acute respiratory failure with hypoxia and possible urinary tract infection.  Patient was discharged from SNF to home on the day of admission.  Reportedly saturating in the 70s on room air when EMS arrived.  CTA chest negative for PE or other pulmonary finding but cardiomegaly.  BNP 259.  RVP negative. ? ?Patient was started on ceftriaxone and low-dose prednisone.  Respiratory failure resolved.  Urine culture with ESBL E. coli and Pseudomonas aeruginosa.  It is difficult to tell if this is UTI or colonization from chronic Foley.  Suprapubic Foley exchanged on 5/10.  Antibiotics changed to IV meropenem on 5/10.  He received fosfomycin on 5/12 prior to discharge to complete treatment course. ? ?Hospital course significant for intermittent confusion and agitation likely due to delirium that seems to have resolved.  However, he remains at risk of delirium. ? ?Therapy recommended SNF.  ? ?See individual problem list below for more on hospital course. ? ?Problems addressed during this hospitalization ?Problem  ?Acute Respiratory Failure With Hypoxia (Hcc)  ?Delirium in patient with underlying cognitive  impairment  ?Catheter-Associated Urinary Tract Infection (Hcc)  ?History of CVA/aphasia  ?Chronic combined CHF  ?History of CAD/CABG  ?S/P Aka (Above Knee Amputation), Right (Hcc)  ?Cognitive Impairment  ?Goals of Care, Counseling/Discussion  ?Protein-calorie malnutrition, severe  ?Chronic Indwelling Foley Catheter  ?Htn (Hypertension)  ?  ?Assessment and Plan: ?* Acute respiratory failure with hypoxia (HCC) ?Reportedly desaturated to 70s when EMS arrived.  Unclear etiology but concern about possible COPD given improvement with low-dose prednisone.  CTA chest negative for PE or pulmonary finding but cardiomegaly.  He has known combined CHF. Respiratory failure resolved.  Saturating at 100% on RA. ?-Aspiration precaution ?-Continue albuterol as needed ? ?Delirium in patient with underlying cognitive impairment ?Seems to have resolved. ?-Treat treatable causes ?-Delirium precaution and reorientation ?-Continue Depakote and Seroquel ? ?Catheter-associated urinary tract infection ([place]) ?Recently hospitalized for ESBL UTI and discharged to SNF.  Urine culture with ESBL E. coli and Pseudomonas aeruginosa.  Difficult to tell if this is true UTI or colonization from chronic Foley but patient had some mental status change.  ?-Ceftriaxone 5/9-5/10>>> meropenem 5/10-5/12>> fosfomycin 5/12 ?-Changed suprapubic catheter on 5/10 ? ?History of CVA/aphasia ?Stable. ?-Continue home medication ? ?Chronic combined CHF ?TTE with LVEF of 20 to 25% (previously 30 to 35%), GH, G2-DD, severe LAE, moderate MVR.  He does not seem to be on diuretics on GDMT at home.  Appears euvolemic on exam. ?-Monitor fluid status ?-Changed amlodipine to low-dose Imdur and hydralazine ?-Consider outpatient follow-up with cardiology ? ? ?History of CAD/CABG ?Patient denies chest pain.  TTE as above.  Serial troponin negative. ?-Started Imdur as above. ? ?S/P AKA (above knee amputation), right ([place]) ?Stable. ?-PT/OT and fall precaution ? ?Cognitive  impairment ?See delirium. ? ?  Goals of care, counseling/discussion ?Remains full code.  Palliative following. ? ?Protein-calorie malnutrition, severe ?Nutrition Status: ?Nutrition Problem: Severe Malnutrition ?Etiology: chronic illness ?Signs/Symptoms: severe fat depletion, severe muscle depletion ?Interventions: Ensure Enlive (each supplement provides 350kcal and 20 grams of protein), Juven, MVI ? ?HTN (hypertension) ?Normotensive.  We will change cardiac meds as above ? ?Chronic indwelling Foley catheter ?Exchanged Foley on 5/10. ? ? 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 Subcutaneous fat may be visible but bone, tendon or muscle are NOT exposed. (Active)  ?09/26/21 1800  ?Location: Sacrum  ?Location Orientation:   ?Staging: Stage 3 -  Full thickness tissue loss. Subcutaneous fat may be visible but bone, tendon or muscle are NOT exposed.  ?Wound Description (Comments):   ?Present on Admission: Yes  ?Dressing Type Foam - Lift dressing to assess site every shift 10/15/21 2155  ? ? ?Vital signs ?Vitals:  ? 10/15/21 1456 10/15/21 2114 10/16/21 0500 10/16/21 0648  ?BP: (!) 105/46 (!) 133/91  130/90  ?Pulse: 60 (!) 58  77  ?Temp: 97.7 ?F (36.5 ?C) 97.7 ?F (36.5 ?C)  97.6 ?F (36.4 ?C)  ?Resp: '17 15  18  '$ ?Height:      ?Weight:   49.1 kg   ?SpO2: (!) 88% 100%  100%  ?TempSrc: Oral Oral  Oral  ?BMI (Calculated):   14.69   ?  ? ?Discharge exam ? ?GENERAL: Appears frail.  Nontoxic. ?HEENT: MMM.  Vision and hearing grossly intact.  ?NECK: Supple.  No apparent JVD.  ?RESP: 100% on RA.  No IWOB.  Fair aeration bilaterally. ?CVS:  RRR. Heart sounds normal.  ?ABD/GI/GU: BS+. Abd soft, NTND.  Suprapubic Foley.  Clear urine in the bag. ?MSK/EXT:  Moves extremities.  Right AKA.  Significant muscle mass and subcu fat loss. ?SKIN: Sacral decubitus.  No drainage or signs of infection. ?NEURO: Awake and alert.  Oriented to self and place.  Follows commands. ?PSYCH: Calm. Normal  affect.  ? ?Discharge Instructions ?Discharge Instructions   ? ? Diet - low sodium heart healthy   Complete by: As directed ?  ? Discharge wound care:   Complete by: As directed ?  ? Stage III sacral decubitus-Daily foam dressings and monitoring  ? Increase activity slowly   Complete by: As directed ?  ? ?  ? ?Allergies as of 10/16/2021   ? ?   Reactions  ? Food Anaphylaxis, Rash  ? Clams  ? Aspirin Itching  ? Crestor [rosuvastatin Calcium] Other (See Comments)  ? Muscle pain  ? [employer] (m. [place]) Skin Test Itching  ? Penicillins Itching  ? ?  ? ?  ?Medication List  ?  ? ?STOP taking these medications   ? ?acidophilus Caps capsule ?  ?amLODipine 10 MG tablet ?Commonly known as: NORVASC ?  ?HYDROcodone-acetaminophen 5-325 MG tablet ?Commonly known as: Norco ?  ?LACTOBACILLUS PO ?  ? ?  ? ?TAKE these medications   ? ?acetaminophen 500 MG tablet ?Commonly known as: TYLENOL ?Take 2 tablets (1,000 mg total) by mouth every 8 (eight) hours as needed for up to 10 days for mild pain, moderate pain or headache. ?  ?albuterol 108 (90 Base) MCG/ACT inhaler ?Commonly known as: VENTOLIN HFA ?Inhale 2 puffs into the lungs every 6 (six) hours as needed for wheezing or shortness of breath. ?  ?ascorbic acid 500 MG tablet ?Commonly known as: VITAMIN C ?Take 1 tablet (500 mg total) by mouth 2 (two) times  daily. ?  ?clopidogrel 75 MG tablet ?Commonly known as: PLAVIX ?Take 1 tablet (75 mg total) by mouth at bedtime. ?  ?divalproex 125 MG DR tablet ?Commonly known as: DEPAKOTE ?Take 1 tablet (125 mg total) by mouth at bedtime. ?  ?docusate sodium 250 MG capsule ?Commonly known as: COLACE ?Take 250 mg by mouth daily. ?  ?feeding supplement Liqd ?Take 237 mLs by mouth 3 (three) times daily between meals. ?  ?hydrALAZINE 10 MG tablet ?Commonly known as: APRESOLINE ?Take 1 tablet (10 mg total) by mouth every 8 (eight) hours. ?  ?isosorbide mononitrate 30 MG 24 hr tablet ?Commonly known as: IMDUR ?Take 0.5 tablets (15 mg  total) by mouth daily. ?  ?nitroGLYCERIN 0.4 MG SL tablet ?Commonly known as: NITROSTAT ?Place 0.4 mg under the tongue every 5 (five) minutes x 3 doses as needed for chest pain. ?  ?oxybutynin 5 MG 24 hr tablet ?Com

## 2021-10-16 NOTE — Progress Notes (Signed)
Occupational Therapy Treatment ?Patient Details ?Name: Todd Dunn ?MRN: [id number] ?DOB: Feb 28, 1941 ?Today's Date: 10/16/2021 ? ? ?History of present illness Patient is a 81 y.o. male who presented with worsening of suprapubic abdominal pain and SOB PMH: indwelling suprapubic urinary catheter secondary to severe BPH, CAD, PVD status post right AKA, SSS status post ICD, HTN, CVA with chronic aphasia, ?  ?OT comments ? Patient demonstrated improved ability to participate and function today. Max assist to transfer to edge of bed and min guard with tactile cues to stay seated at edge of bed. Patient mod x 2 to pivot to recliner. Patient performed feeding task with supervision and verbal cues. He demonstrated ability to feed himself with increased time. Has a tendency to become distracted and somewhat paranoid with hallway traffic. Cont POC.  ? ?Recommendations for follow up therapy are one component of a multi-disciplinary discharge planning process, led by the attending physician.  Recommendations may be updated based on patient status, additional functional criteria and insurance authorization. ?   ?Follow Up Recommendations ? Skilled nursing-short term rehab (<3 hours/day)  ?  ?Assistance Recommended at Discharge Frequent or constant Supervision/Assistance  ?Patient can return home with the following ? Two people to help with walking and/or transfers;Assistance with cooking/housework;Assistance with feeding;Direct supervision/assist for medications management;Direct supervision/assist for financial management;Assist for transportation;Help with stairs or ramp for entrance;A lot of help with bathing/dressing/bathroom ?  ?Equipment Recommendations ? None recommended by OT  ?  ?Recommendations for Other Services   ? ?  ?Precautions / Restrictions Precautions ?Precautions: Fall ?Precaution Comments: suprapubic cath ?Restrictions ?Weight Bearing Restrictions: No ?Other Position/Activity Restrictions: R AKA  ? ? ?   ? ?Mobility Bed Mobility ?  ?  ?  ?  ?  ?  ?  ?  ?  ? ?Transfers ?  ?  ?  ?  ?  ?  ?  ?  ?  ?  ?  ?  ?Balance Overall balance assessment: Needs assistance ?Sitting-balance support: No upper extremity supported, Feet supported ?Sitting balance-Leahy Scale: Fair ?Sitting balance - Comments: min guar - at times tactile cues to stay upright. ?  ?Standing balance support: During functional activity ?Standing balance-Leahy Scale: Poor ?  ?  ?  ?  ?  ?  ?  ?  ?  ?  ?  ?  ?   ? ?ADL either performed or assessed with clinical judgement  ? ?ADL Overall ADL's : Needs assistance/impaired ?Eating/Feeding: Set up;Sitting ?Eating/Feeding Details (indicate cue type and reason): sat up patient's food tray; patient able to use fork and feed himself. Was somewhat distracted by hallway activity and likes to move his cups around but demonstrated ability to feed himself with increased time. ?  ?  ?  ?  ?  ?  ?  ?  ?  ?  ?  ?  ?  ?  ?  ?  ?Functional mobility during ADLs: Moderate assistance;+2 for physical assistance ?General ADL Comments: Patient demonstrated good upper body strength when pulilng up into sitting - he still needed assistane for trunk and LE to transfer into sitting. Sat at edge of bed for extended amount of time to work on sitting tolerance. Tactile cues to keep him in upright position. ?  ? ?Extremity/Trunk Assessment Upper Extremity Assessment ?Upper Extremity Assessment: Overall WFL for tasks assessed (strong upper extremities - unable to formally assess) ?  ?  ?  ?  ?  ? ?Vision   ?Vision Assessment?: No apparent  visual deficits ?  ?Perception   ?  ?Praxis   ?  ? ?Cognition Arousal/Alertness: Awake/alert ?Behavior During Therapy: Impulsive ?Overall Cognitive Status: Difficult to assess ?  ?  ?  ?  ?  ?  ?  ?  ?  ?  ?  ?  ?  ?  ?  ?  ?General Comments: mumbled speech throughout pt difficult to understand, follows commands ~25% of time with tactile cues ?  ?  ?   ?Exercises   ? ?  ?Shoulder Instructions   ? ? ?   ?General Comments    ? ? ?Pertinent Vitals/ Pain       Pain Assessment ?Faces Pain Scale: No hurt ? ?Home Living   ?  ?  ?  ?  ?  ?  ?  ?  ?  ?  ?  ?  ?  ?  ?  ?  ?  ?  ? ?  ?Prior Functioning/Environment    ?  ?  ?  ?   ? ?Frequency ? Min 2X/week  ? ? ? ? ?  ?Progress Toward Goals ? ?OT Goals(current goals can now be found in the care plan section) ? Progress towards OT goals: Progressing toward goals ? ?Acute Rehab OT Goals ?OT Goal Formulation: Patient unable to participate in goal setting ?Time For Goal Achievement: 10/27/21 ?Potential to Achieve Goals: Fair  ?Plan Discharge plan remains appropriate;Frequency remains appropriate   ? ?Co-evaluation ? ? ? PT/OT/SLP Co-Evaluation/Treatment: Yes ?Reason for Co-Treatment: Necessary to address cognition/behavior during functional activity;For patient/therapist safety;To address functional/ADL transfers ?  ?OT goals addressed during session: ADL's and self-care ?  ? ?  ?AM-PAC OT "6 Clicks" Daily Activity     ?Outcome Measure ? ? Help from another person eating meals?: A Little ?Help from another person taking care of personal grooming?: A Little ?Help from another person toileting, which includes using toliet, bedpan, or urinal?: Total ?Help from another person bathing (including washing, rinsing, drying)?: Total ?Help from another person to put on and taking off regular upper body clothing?: A Lot ?Help from another person to put on and taking off regular lower body clothing?: Total ?6 Click Score: 11 ? ?  ?End of Session   ? ?OT Visit Diagnosis: Other abnormalities of gait and mobility (R26.89);Muscle weakness (generalized) (M62.81);Other symptoms and signs involving cognitive function ?  ?Activity Tolerance Patient tolerated treatment well ?  ?Patient Left with call bell/phone within reach;in chair;with chair alarm set ?  ?Nurse Communication Mobility status ?  ? ?   ? ?Time: 9211-9417 ?OT Time Calculation (min): 19 min ? ?Charges: OT General Charges ?$OT Visit: 1  Visit ?OT Treatments ?$Self Care/Home Management : 8-22 mins ? ?Adryana Mogensen, OTR/L ?Acute Care Rehab Services  ?Office [phone] ?Pager: [phone]  ? ?Isela Stantz L Harlis Champoux ?10/16/2021, 1:17 PM ?

## 2021-10-16 NOTE — Plan of Care (Addendum)
?  Problem: Respiratory: ?Goal: Ability to maintain a clear airway will improve ?Outcome: Progressing ?Goal: Levels of oxygenation will improve ?Outcome: Progressing ?Goal: Ability to maintain adequate ventilation will improve ?Outcome: Progressing ?  ?Problem: Coping: ?Goal: Level of anxiety will decrease ?Outcome: Progressing ?  ?Problem: Elimination: ?Goal: Will not experience complications related to urinary retention ?Outcome: Progressing ?  ?Problem: Pain Managment: ?Goal: General experience of comfort will improve ?Outcome: Progressing ?  ?Problem: Safety: ?Goal: Ability to remain free from injury will improve ?Outcome: Progressing ?  ?Problem: Skin Integrity: ?Goal: Risk for impaired skin integrity will decrease ?Outcome: Progressing ?  ?

## 2021-10-16 NOTE — Care Management Important Message (Signed)
Important Message ? ?Patient Details IM Letter placed in Patients room. ?Name: Todd Dunn ?MRN: [id number] ?Date of Birth: [birth year] ? ? ?Medicare Important Message Given:  Yes ? ? ? ? ?Kerin Salen ?10/16/2021, 1:18 PM ?

## 2021-10-16 NOTE — Progress Notes (Signed)
Chaplain received a consult to assist Todd Dunn with his HCPOA, however patient was neither alert nor oriented and, according to RN, has not been for most of the day. Chaplain is unable to have him sign without his being alert and oriented.   ? ?[employer], Bcc ?Pager, [phone] ?

## 2021-10-17 LAB — URINE CULTURE: Culture: >=100000 — AB

## 2021-10-17 NOTE — Progress Notes (Signed)
Patient discharged to SNF yesterday (10/16/2021).  He remained in the hospital due to insurance authorization.  No major events overnight or this morning.  Multiple family members including patient's daughter and grandchildren at bedside this morning.  No questions or concerns.  He is stable for transfer to SNF.  Discharge summary from 10/16/2021 remains in effect. ?

## 2021-10-17 NOTE — Progress Notes (Signed)
?PROGRESS NOTE ? ?Todd Dunn OQH:[id number] DOB: 06/26/1940  ? ?PCP: [name], NP ? ?Patient is from: Home ? ?DOA: 10/12/2021 LOS: 4 ? ?Chief complaints ?Chief Complaint  ?Patient presents with  ? Shortness of Breath  ?  ? ?Brief Narrative / Interim history: ?81 year old M with PMH of dementia, CVA, aphasia, BPH/chronic Foley, combined CHF, CAD/CABG, PAD, SSS/PPM, right AKA and recent hospitalization for ESBL UTI, acute metabolic encephalopathy and severe dehydration presenting with shortness of breath, hypoxia to 70s and suprapubic pain, and admitted for acute respiratory failure with hypoxia and possible urinary tract infection.  Patient was discharged from SNF to home on the day of admission.  Reportedly saturating in the 70s on room air when EMS arrived.  CTA chest negative for PE or other pulmonary finding but cardiomegaly.  BNP 259.  RVP negative. ? ?Patient was started on ceftriaxone and low-dose prednisone.  Respiratory failure resolved.  Urine culture with ESBL E. coli and Pseudomonas aeruginosa.  It is difficult to tell if this is UTI or colonization from chronic Foley.  Suprapubic Foley exchanged on 5/10.  Antibiotics changed to IV meropenem on 5/10.  He received fosfomycin on 5/12 prior to discharge to complete treatment course. ? ?Hospital course significant for intermittent confusion and agitation likely due to delirium that seems to have resolved.  However, he remains at risk of delirium. ? ?Therapy recommended SNF.  ? ?Subjective: ?Seen and examined earlier this morning.  Reportedly confused and refusing medications and care, and hearing are stopped.  He is sleepy but wakes to voice.  He is oriented to self but not place and time.  He does not follow command consistently.  Easily aggravated and agitated. ? ?Objective: ?Vitals:  ? 10/17/21 0603 10/17/21 1241 10/17/21 2013 10/17/21 2132  ?BP: 133/71 (!) 149/80 (!) 107/55 127/65  ?Pulse: 61 85 72   ?Resp: '20 16 18   '$ ?Temp: 98.1 ?F (36.7 ?C)  97.8 ?F (36.6 ?C) 97.7 ?F (36.5 ?C)   ?TempSrc:  Axillary    ?SpO2: 96%  93%   ?Weight:      ?Height:      ? ? ?Examination: ? ?GENERAL: No acute distress.  Appears well.  ?HEENT: MMM.  Vision and hearing grossly intact.  ?NECK: Supple.  No apparent JVD.  ?RESP:  No IWOB. Good air movement bilaterally. ?CVS:  RRR. Heart sounds normal.  ?ABD/GI/GU: Bowel sounds present. Soft. Non tender. Suprapubic Foley.  Clear looking urine in Foley bag. ?MSK/EXT:  Moves extremities. Right AKA.  Significant muscle mass and subcu fat loss. ?SKIN: no apparent skin lesion or wound ?NEURO: Awake, alert and oriented self, place and family. Expressive aphasia. No apparent focal neuro deficit. ?PSYCH: calm and playful with grand children ?Procedures:  ?None ? ?Microbiology summarized: ?COVID-19 and influenza PCR nonreactive. ?Full RVP nonreactive.   ?Urine culture with E. Coli and Pseudomonas aeruginosa.  Sensitivity pending. ? ?Assessment and Plan: ?* Acute respiratory failure with hypoxia ([place]) ?Reportedly desaturated to 70s when EMS arrived.  Unclear etiology but concern about possible COPD given improvement with low-dose prednisone.  CTA chest negative for PE or pulmonary finding but cardiomegaly.  He has known combined CHF. Respiratory failure resolved.  Saturating at 100% on RA. ?-Aspiration precaution ?-Continue albuterol as needed ? ?Delirium in patient with underlying cognitive impairment ?Seems to have resolved. ?-Delirium precaution and reorientation ?-Continue Depakote and Seroquel ? ?Catheter-associated urinary tract infection ([place]) ?Recently hospitalized for ESBL UTI and discharged to SNF.  Urine culture with ESBL E.  coli and Pseudomonas aeruginosa.  Difficult to tell if this is true UTI or colonization from chronic Foley but patient had some mental status change.  ?-Ceftriaxone 5/9-5/10>>> meropenem 5/10-5/12>> fosfomycin 5/12 ?-Changed suprapubic catheter on 5/10 ? ?History of CVA/aphasia ?Stable. ?-Continue home  medication ? ?Chronic combined CHF ?TTE with LVEF of 20 to 25% (previously 30 to 35%), GH, G2-DD, severe LAE, moderate MVR.  He does not seem to be on diuretics on GDMT at home.  Appears euvolemic on exam.  ?-Changed amlodipine to low-dose Imdur and hydralazine ?-Consider outpatient follow-up with cardiology ? ? ?History of CAD/CABG ?Patient denies chest pain.  TTE as above.  Serial troponin negative. ?-Started Imdur as above. ? ?S/P AKA (above knee amputation), right ([place]) ?Stable. ?-PT/OT and fall precaution ? ?Cognitive impairment ?See delirium. ? ?Goals of care, counseling/discussion ?Remains full code.  Palliative following. ? ?Protein-calorie malnutrition, severe ?Nutrition Status: ?Nutrition Problem: Severe Malnutrition ?Etiology: chronic illness ?Signs/Symptoms: severe fat depletion, severe muscle depletion ?Interventions: Ensure Enlive (each supplement provides 350kcal and 20 grams of protein), Juven, MVI ? ?HTN (hypertension) ?Normotensive.  We will change cardiac meds as above ? ?Chronic indwelling Foley catheter ?Exchanged Foley on 5/10. ? ? ? ?DVT prophylaxis:  ?rivaroxaban (XARELTO) tablet 10 mg Start: 10/15/21 1400 ?rivaroxaban (XARELTO) tablet 10 mg  ?Code Status: Full code ?Family Communication: Updated patient's granddaughter, [name] at bedside ?Level of care: Telemetry ?Status is: Inpatient ?Remains inpatient appropriate because: SNF bed ? ? ?Final disposition: SNF ?Consultants:  ?Palliative medicine ? ?Sch Meds:  ?Scheduled Meds: ? vitamin C  500 mg Oral BID  ? divalproex  125 mg Oral QHS  ? feeding supplement  237 mL Oral TID BM  ? hydrALAZINE  10 mg Oral Q8H  ? isosorbide mononitrate  15 mg Oral Daily  ? mouth rinse  15 mL Mouth Rinse BID  ? nutrition supplement (JUVEN)  1 packet Oral BID BM  ? oxybutynin  5 mg Oral QHS  ? pantoprazole  40 mg Oral Daily  ? potassium chloride  40 mEq Oral BID  ? QUEtiapine  50 mg Oral BID  ? rivaroxaban  10 mg Oral Daily  ? sodium chloride flush  3 mL  Intravenous Q12H  ? zinc sulfate  220 mg Oral Daily  ? ?Continuous Infusions: ? ? ?PRN Meds:.acetaminophen **OR** acetaminophen, albuterol, polyethylene glycol ? ?Antimicrobials: ?Anti-infectives (From admission, onward)  ? ? Start     Dose/Rate Route Frequency Ordered Stop  ? 10/16/21 1400  fosfomycin (MONUROL) packet 3 g       ? 3 g Oral  Once 10/16/21 1135 10/16/21 1733  ? 10/14/21 1600  meropenem (MERREM) 1 g in sodium chloride 0.9 % 100 mL IVPB  Status:  Discontinued       ? 1 g ?200 mL/hr over 30 Minutes Intravenous Every 8 hours 10/14/21 1517 10/16/21 1135  ? 10/13/21 2000  cefTRIAXone (ROCEPHIN) 2 g in sodium chloride 0.9 % 100 mL IVPB  Status:  Discontinued       ? 2 g ?200 mL/hr over 30 Minutes Intravenous Every 24 hours 10/13/21 1540 10/14/21 1501  ? 10/12/21 2145  cefTRIAXone (ROCEPHIN) 2 g in sodium chloride 0.9 % 100 mL IVPB       ? 2 g ?200 mL/hr over 30 Minutes Intravenous  Once 10/12/21 2139 10/12/21 2247  ? ?  ? ? ? ?I have personally reviewed the following labs and images: ?CBC: ?Recent Labs  ?Lab 10/12/21 ?2020 10/13/21 ?0454 10/14/21 ?0450 10/15/21 ?9509 10/16/21 ?  0429  ?WBC 5.1 3.6* 5.7 7.1 5.1  ?HGB 13.3 13.0 12.4* 12.0* 12.3*  ?HCT 41.8 41.8 40.8 38.6* 40.1  ?MCV 90.7 89.7 90.9 90.8 91.6  ?PLT 207 217 210 199 182  ? ?BMP &GFR ?Recent Labs  ?Lab 10/12/21 ?2020 10/12/21 ?2205 10/13/21 ?6387 10/14/21 ?0450 10/15/21 ?5643 10/16/21 ?0429  ?NA 138  --  141 140 138 138  ?K 3.2*  --  3.8 4.7 4.8 4.4  ?CL 105  --  106 105 106 105  ?CO2 27  --  '31 29 28 29  '$ ?GLUCOSE 117*  --  112* 107* 112* 129*  ?BUN 15  --  11 16 25* 27*  ?CREATININE 0.73  --  0.71 0.61 0.63 0.59*  ?CALCIUM 10.0  --  9.9 10.5* 10.7* 10.3  ?MG  --  2.0  --   --  2.1 2.2  ?PHOS  --   --   --   --  2.5 2.2*  ? ?Estimated Creatinine Clearance: 49.4 mL/min (A) (by C-G formula based on SCr of 0.59 mg/dL (L)). ?Liver & Pancreas: ?Recent Labs  ?Lab 10/13/21 ?0454 10/15/21 ?3295 10/16/21 ?0429  ?AST 15  --   --   ?ALT 15  --   --   ?ALKPHOS  138*  --   --   ?BILITOT 0.4  --   --   ?PROT 6.7  --   --   ?ALBUMIN 3.1* 3.0* 3.1*  ? ?No results for input(s): LIPASE, AMYLASE in the last 168 hours. ?No results for input(s): AMMONIA in the last 168 hours. ?Diabetic

## 2021-10-17 NOTE — TOC Progression Note (Addendum)
Transition of Care (TOC) - Progression Note  ? ? ?Patient Details  ?Name: ANGELINO RUMERY ?MRN: [id number] ?Date of Birth: 1940/08/08 ? ?Transition of Care (TOC) CM/SW Contact  ?Ross Ludwig, LCSW ?Phone Number: ?10/17/2021, 1:22 PM ? ?Clinical Narrative:    ? ?CSW sent updated clinicals to insurance company.  CSW awaiting updates on insurance approval.  Insurance approval is still pending. ? ? ?Expected Discharge Plan: [place] ?Barriers to Discharge: Continued Medical Work up ? ?Expected Discharge Plan and Services ?Expected Discharge Plan: [place] ?  ?Discharge Planning Services: CM Consult ?Post Acute Care Choice: Home Health ?Living arrangements for the past 2 months: [place] ?Expected Discharge Date: 10/16/21               ?  ?  ?  ?  ?  ?  ?  ?  ?  ?  ? ? ?Social Determinants of Health (SDOH) Interventions ?  ? ?Readmission Risk Interventions ? ?  08/04/2021  ?  9:19 AM 06/24/2021  ? 11:26 AM 02/17/2021  ?  9:12 AM  ?Readmission Risk Prevention Plan  ?Transportation Screening Complete Complete Complete  ?Medication Review Press photographer) Complete Complete Complete  ?PCP or Specialist appointment within 3-5 days of discharge Complete Complete Complete  ?[place] or Home Care Consult Complete Complete Complete  ?SW Recovery Care/Counseling Consult Complete  Complete  ?Palliative Care Screening Not Applicable Not Applicable Not Applicable  ?Skilled Nursing Facility Complete Not Applicable Not Applicable  ? ? ?

## 2021-10-18 NOTE — TOC Progression Note (Signed)
Transition of Care (TOC) - Progression Note  ? ? ?Patient Details  ?Name: ROHAN JUENGER ?MRN: [id number] ?Date of Birth: 1941/02/17 ? ?Transition of Care (TOC) CM/SW Contact  ?Ross Ludwig, LCSW ?Phone Number: ?10/18/2021, 2:21 PM ? ?Clinical Narrative:    ? ?CSW checked on insurance authorization, it is still pending.  CSW to continue to follow patient's progress throughout discharge planning. ? ?Expected Discharge Plan: [place] ?Barriers to Discharge: Continued Medical Work up ? ?Expected Discharge Plan and Services ?Expected Discharge Plan: [place] ?  ?Discharge Planning Services: CM Consult ?Post Acute Care Choice: Home Health ?Living arrangements for the past 2 months: [place] ?Expected Discharge Date: 10/16/21               ?  ?  ?  ?  ?  ?  ?  ?  ?  ?  ? ? ?Social Determinants of Health (SDOH) Interventions ?  ? ?Readmission Risk Interventions ? ?  08/04/2021  ?  9:19 AM 06/24/2021  ? 11:26 AM 02/17/2021  ?  9:12 AM  ?Readmission Risk Prevention Plan  ?Transportation Screening Complete Complete Complete  ?Medication Review Press photographer) Complete Complete Complete  ?PCP or Specialist appointment within 3-5 days of discharge Complete Complete Complete  ?[place] or Home Care Consult Complete Complete Complete  ?SW Recovery Care/Counseling Consult Complete  Complete  ?Palliative Care Screening Not Applicable Not Applicable Not Applicable  ?Skilled Nursing Facility Complete Not Applicable Not Applicable  ? ? ?

## 2021-10-18 NOTE — Progress Notes (Signed)
?PROGRESS NOTE ? ?Todd Dunn VQM:[id number] DOB: Nov 18, 1940  ? ?PCP: [name], NP ? ?Patient is from: Home ? ?DOA: 10/12/2021 LOS: 5 ? ?Chief complaints ?Chief Complaint  ?Patient presents with  ? Shortness of Breath  ?  ? ?Brief Narrative / Interim history: ?81 year old M with PMH of dementia, CVA, aphasia, BPH/chronic Foley, combined CHF, CAD/CABG, PAD, SSS/PPM, right AKA and recent hospitalization for ESBL UTI, acute metabolic encephalopathy and severe dehydration presenting with shortness of breath, hypoxia to 70s and suprapubic pain, and admitted for acute respiratory failure with hypoxia and possible urinary tract infection.  Patient was discharged from SNF to home on the day of admission.  Reportedly saturating in the 70s on room air when EMS arrived.  CTA chest negative for PE or other pulmonary finding but cardiomegaly.  BNP 259.  RVP negative. ? ?Patient was started on ceftriaxone and low-dose prednisone.  Respiratory failure resolved.  Urine culture with ESBL E. coli and Pseudomonas aeruginosa.  It is difficult to tell if this is UTI or colonization from chronic Foley.  Suprapubic Foley exchanged on 5/10.  Antibiotics changed to IV meropenem on 5/10.  He received fosfomycin on 5/12 prior to discharge to complete treatment course. ? ?Hospital course significant for intermittent confusion and agitation likely due to delirium that seems to have resolved.  However, he remains at risk of delirium. ? ?Therapy recommended SNF.  ? ?Subjective: ?Seen and examined earlier this morning.  No major events overnight of this morning.  No complaints-adequate historian. ? ?Objective: ?Vitals:  ? 10/17/21 2132 10/18/21 0454 10/18/21 0500 10/18/21 1247  ?BP: 127/65 (!) 119/53  105/62  ?Pulse:  (!) 47  73  ?Resp:  18  16  ?Temp:  97.6 ?F (36.4 ?C)  97.9 ?F (36.6 ?C)  ?TempSrc:    Oral  ?SpO2:  99%  100%  ?Weight:   52.3 kg   ?Height:      ? ? ?Examination: ? ?GENERAL: Appears frail.  Nontoxic. ?HEENT: MMM.   Vision and hearing grossly intact.  ?NECK: Supple.  No apparent JVD.  ?RESP:  No IWOB.  Fair aeration bilaterally. ?CVS:  RRR. Heart sounds normal.  ?ABD/GI/GU: BS+. Abd soft, NTND.  Suprapubic Foley.  Clear looking urine in bag. ?MSK/EXT:  Moves extremities.  Right AKA.  Significant muscle mass and subcu fat loss. ?SKIN: Stage III sacral decubitus ?NEURO: Awake and alert. Oriented to self and place.  Follows commands.  No apparent focal neuro deficit. ?PSYCH: Calm. Normal affect.  ?Procedures:  ?None ? ?Microbiology summarized: ?COVID-19 and influenza PCR nonreactive. ?Full RVP nonreactive.   ?Urine culture with E. Coli and Pseudomonas aeruginosa.  Sensitivity pending. ? ?Assessment and Plan: ?* Acute respiratory failure with hypoxia ([place]) ?Reportedly desaturated to 70s when EMS arrived.  Unclear etiology but concern about possible COPD given improvement with low-dose prednisone.  CTA chest negative for PE or pulmonary finding but cardiomegaly.  He has known combined CHF. Respiratory failure resolved.  Saturating at 100% on RA. ?-Aspiration precaution ?-Continue albuterol as needed ? ?Delirium in patient with underlying cognitive impairment ?Seems to have resolved. ?-Delirium precaution and reorientation ?-Continue Depakote and Seroquel ? ?Catheter-associated urinary tract infection ([place]) ?Recently hospitalized for ESBL UTI and discharged to SNF.  Urine culture with ESBL E. coli and Pseudomonas aeruginosa.  Difficult to tell if this is true UTI or colonization from chronic Foley but patient had some mental status change.  ?-Ceftriaxone 5/9-5/10>>> meropenem 5/10-5/12>> fosfomycin 5/12 ?-Changed suprapubic catheter on 5/10 ? ?History  of CVA/aphasia ?Stable. ?-Continue home medication ? ?Chronic combined CHF ?TTE with LVEF of 20 to 25% (previously 30 to 35%), GH, G2-DD, severe LAE, moderate MVR.  He does not seem to be on diuretics on GDMT at home.  Appears euvolemic on exam.  ?-Changed amlodipine to low-dose Imdur  and hydralazine ?-Consider outpatient follow-up with cardiology ? ? ?History of CAD/CABG ?Patient denies chest pain.  TTE as above.  Serial troponin negative. ?-Started Imdur as above. ? ?S/P AKA (above knee amputation), right ([place]) ?Stable. ?-PT/OT and fall precaution ? ?Cognitive impairment ?See delirium. ? ?Goals of care, counseling/discussion ?Remains full code.  Palliative following. ? ?Protein-calorie malnutrition, severe ?Nutrition Status: ?Nutrition Problem: Severe Malnutrition ?Etiology: chronic illness ?Signs/Symptoms: severe fat depletion, severe muscle depletion ?Interventions: Ensure Enlive (each supplement provides 350kcal and 20 grams of protein), Juven, MVI ? ?HTN (hypertension) ?Normotensive.  We will change cardiac meds as above ? ?Chronic indwelling Foley catheter ?Exchanged Foley on 5/10. ? ? ? ?DVT prophylaxis:  ?rivaroxaban (XARELTO) tablet 10 mg Start: 10/15/21 1400 ?rivaroxaban (XARELTO) tablet 10 mg  ?Code Status: Full code ?Family Communication: None at bedside today ?Level of care: Telemetry ?Status is: Inpatient ?Remains inpatient appropriate because: SNF bed pending insurance authorization ? ? ?Final disposition: SNF ?Consultants:  ?Palliative medicine ? ?Sch Meds:  ?Scheduled Meds: ? vitamin C  500 mg Oral BID  ? divalproex  125 mg Oral QHS  ? feeding supplement  237 mL Oral TID BM  ? hydrALAZINE  10 mg Oral Q8H  ? isosorbide mononitrate  15 mg Oral Daily  ? mouth rinse  15 mL Mouth Rinse BID  ? nutrition supplement (JUVEN)  1 packet Oral BID BM  ? oxybutynin  5 mg Oral QHS  ? pantoprazole  40 mg Oral Daily  ? potassium chloride  40 mEq Oral BID  ? QUEtiapine  50 mg Oral BID  ? rivaroxaban  10 mg Oral Daily  ? sodium chloride flush  3 mL Intravenous Q12H  ? zinc sulfate  220 mg Oral Daily  ? ?Continuous Infusions: ? ? ?PRN Meds:.acetaminophen **OR** acetaminophen, albuterol, polyethylene glycol ? ?Antimicrobials: ?Anti-infectives (From admission, onward)  ? ? Start     Dose/Rate Route  Frequency Ordered Stop  ? 10/16/21 1400  fosfomycin (MONUROL) packet 3 g       ? 3 g Oral  Once 10/16/21 1135 10/16/21 1733  ? 10/14/21 1600  meropenem (MERREM) 1 g in sodium chloride 0.9 % 100 mL IVPB  Status:  Discontinued       ? 1 g ?200 mL/hr over 30 Minutes Intravenous Every 8 hours 10/14/21 1517 10/16/21 1135  ? 10/13/21 2000  cefTRIAXone (ROCEPHIN) 2 g in sodium chloride 0.9 % 100 mL IVPB  Status:  Discontinued       ? 2 g ?200 mL/hr over 30 Minutes Intravenous Every 24 hours 10/13/21 1540 10/14/21 1501  ? 10/12/21 2145  cefTRIAXone (ROCEPHIN) 2 g in sodium chloride 0.9 % 100 mL IVPB       ? 2 g ?200 mL/hr over 30 Minutes Intravenous  Once 10/12/21 2139 10/12/21 2247  ? ?  ? ? ? ?I have personally reviewed the following labs and images: ?CBC: ?Recent Labs  ?Lab 10/12/21 ?2020 10/13/21 ?0454 10/14/21 ?0450 10/15/21 ?3570 10/16/21 ?0429  ?WBC 5.1 3.6* 5.7 7.1 5.1  ?HGB 13.3 13.0 12.4* 12.0* 12.3*  ?HCT 41.8 41.8 40.8 38.6* 40.1  ?MCV 90.7 89.7 90.9 90.8 91.6  ?PLT 207 217 210 199 182  ? ?  BMP &GFR ?Recent Labs  ?Lab 10/12/21 ?2020 10/12/21 ?2205 10/13/21 ?6837 10/14/21 ?0450 10/15/21 ?2902 10/16/21 ?0429  ?NA 138  --  141 140 138 138  ?K 3.2*  --  3.8 4.7 4.8 4.4  ?CL 105  --  106 105 106 105  ?CO2 27  --  '31 29 28 29  '$ ?GLUCOSE 117*  --  112* 107* 112* 129*  ?BUN 15  --  11 16 25* 27*  ?CREATININE 0.73  --  0.71 0.61 0.63 0.59*  ?CALCIUM 10.0  --  9.9 10.5* 10.7* 10.3  ?MG  --  2.0  --   --  2.1 2.2  ?PHOS  --   --   --   --  2.5 2.2*  ? ?Estimated Creatinine Clearance: 52.7 mL/min (A) (by C-G formula based on SCr of 0.59 mg/dL (L)). ?Liver & Pancreas: ?Recent Labs  ?Lab 10/13/21 ?0454 10/15/21 ?1115 10/16/21 ?0429  ?AST 15  --   --   ?ALT 15  --   --   ?ALKPHOS 138*  --   --   ?BILITOT 0.4  --   --   ?PROT 6.7  --   --   ?ALBUMIN 3.1* 3.0* 3.1*  ? ?No results for input(s): LIPASE, AMYLASE in the last 168 hours. ?No results for input(s): AMMONIA in the last 168 hours. ?Diabetic: ?No results for input(s): HGBA1C  in the last 72 hours. ?No results for input(s): GLUCAP in the last 168 hours. ?Cardiac Enzymes: ?No results for input(s): CKTOTAL, CKMB, CKMBINDEX, TROPONINI in the last 168 hours. ?No results for input(s)

## 2021-10-19 ENCOUNTER — Inpatient Hospital Stay: Payer: Medicare Other | Attending: Physician Assistant

## 2021-10-19 ENCOUNTER — Inpatient Hospital Stay: Payer: Medicare Other | Admitting: Hematology and Oncology

## 2021-10-19 MED ORDER — ISOSORBIDE MONONITRATE ER 30 MG PO TB24: 15.0000 mg | ORAL_TABLET | Freq: Every day | ORAL | 1 refills | Status: DC

## 2021-10-19 MED ORDER — ASCORBIC ACID 500 MG PO TABS: 500.0000 mg | ORAL_TABLET | Freq: Two times a day (BID) | ORAL | 1 refills | Status: DC

## 2021-10-19 MED ORDER — HYDRALAZINE HCL 10 MG PO TABS: 10.0000 mg | ORAL_TABLET | Freq: Three times a day (TID) | ORAL | 1 refills | Status: DC

## 2021-10-19 MED ORDER — ZINC SULFATE 220 (50 ZN) MG PO CAPS: 220.0000 mg | ORAL_CAPSULE | Freq: Every day | ORAL | 1 refills | Status: DC

## 2021-10-19 NOTE — TOC Progression Note (Addendum)
Transition of Care (TOC) - Progression Note  ? ? ?Patient Details  ?Name: Todd Dunn ?MRN: [id number] ?Date of Birth: Mar 27, 1941 ? ?Transition of Care (TOC) CM/SW Contact  ?Leeroy Cha, RN ?Phone Number: ?10/19/2021, 9:13 AM ? ?Clinical Narrative:    ?0800/auth through [employer] still pending in the system. ?[facility]cf-[employer]. Will need peer to peer review. Due to status and iv meds.  Information sent to Dr. Cyndia Skeeters for return call. ? ?Expected Discharge Plan: [place] ?Barriers to Discharge: Continued Medical Work up ? ?Expected Discharge Plan and Services ?Expected Discharge Plan: [place] ?  ?Discharge Planning Services: CM Consult ?Post Acute Care Choice: Home Health ?Living arrangements for the past 2 months: [place] ?Expected Discharge Date: 10/19/21               ?  ?  ?  ?  ?  ?  ?  ?  ?  ?  ? ? ?Social Determinants of Health (SDOH) Interventions ?  ? ?Readmission Risk Interventions ? ?  08/04/2021  ?  9:19 AM 06/24/2021  ? 11:26 AM 02/17/2021  ?  9:12 AM  ?Readmission Risk Prevention Plan  ?Transportation Screening Complete Complete Complete  ?Medication Review Press photographer) Complete Complete Complete  ?PCP or Specialist appointment within 3-5 days of discharge Complete Complete Complete  ?[place] or Home Care Consult Complete Complete Complete  ?SW Recovery Care/Counseling Consult Complete  Complete  ?Palliative Care Screening Not Applicable Not Applicable Not Applicable  ?Skilled Nursing Facility Complete Not Applicable Not Applicable  ? ? ?

## 2021-10-19 NOTE — TOC Progression Note (Addendum)
Transition of Care (TOC) - Progression Note  ? ? ?Patient Details  ?Name: Todd Dunn ?MRN: [id number] ?Date of Birth: 1940/10/19 ? ?Transition of Care (TOC) CM/SW Contact  ?Leeroy Cha, RN ?Phone Number: ?10/19/2021, 12:33 PM ? ?Clinical Narrative:    ?Tct-granddaughter-has a hospital bed already does need the wheelchair adapt made aware.  Can be here to pick up after 7 pm today. ?Hhc will be done through [facility].  Will start this week asap.  Have tried other providers none will take the [place], [facility]and [facility]. ? ?Expected Discharge Plan: [place] ?Barriers to Discharge: Barriers Resolved ? ?Expected Discharge Plan and Services ?Expected Discharge Plan: [place] ?  ?Discharge Planning Services: CM Consult ?Post Acute Care Choice: Home Health ?Living arrangements for the past 2 months: [place] ?Expected Discharge Date: 10/19/21               ?  ?  ?  ?  ?  ?  ?  ?  ?  ?  ? ? ?Social Determinants of Health (SDOH) Interventions ?  ? ?Readmission Risk Interventions ? ?  08/04/2021  ?  9:19 AM 06/24/2021  ? 11:26 AM 02/17/2021  ?  9:12 AM  ?Readmission Risk Prevention Plan  ?Transportation Screening Complete Complete Complete  ?Medication Review Press photographer) Complete Complete Complete  ?PCP or Specialist appointment within 3-5 days of discharge Complete Complete Complete  ?[place] or Home Care Consult Complete Complete Complete  ?SW Recovery Care/Counseling Consult Complete  Complete  ?Palliative Care Screening Not Applicable Not Applicable Not Applicable  ?Skilled Nursing Facility Complete Not Applicable Not Applicable  ? ? ?

## 2021-10-19 NOTE — Care Management Important Message (Signed)
Important Message ? ?Patient Details IM Letter placed in Patients room. ?Name: Todd Dunn ?MRN: [id number] ?Date of Birth: 1940/08/10 ? ? ?Medicare Important Message Given:  Yes ? ? ? ? ?Kerin Salen ?10/19/2021, 1:52 PM ?

## 2021-10-19 NOTE — Discharge Summary (Addendum)
? ?Physician Discharge Summary  ?Todd Dunn PZW:[id number] DOB: 09-Aug-1940 DOA: 10/12/2021 ? ?PCP: Todd Pounds, NP ? ?Admit date: 10/12/2021 ?Discharge date: 10/19/2021 ?Admitted From: Home ?Disposition: Home ?Recommendations for Outpatient Follow-up:  ?Follow ups as below. ?Please obtain CBC/BMP/Mag at follow up ?Outpatient follow-up with cardiology in 1 to 2 weeks ?Outpatient follow-up with urology as previously planned ?Recommend outpatient follow-up with palliative care ?Please follow up on the following pending results: None ? ?Home health: PT/OT/RN ?DME: Patient has hospital bed.  Wheelchair ordered. ? ?Discharge Condition: Stable but guarded prognosis ?CODE STATUS: Full code ? ? ?Hospital course ?81 year old M with PMH of dementia, CVA, aphasia, BPH/chronic Foley, combined CHF, CAD/CABG, PAD, SSS/PPM, right AKA and recent hospitalization for ESBL UTI, acute metabolic encephalopathy and severe dehydration presenting with shortness of breath, hypoxia to 70s and suprapubic pain, and admitted for acute respiratory failure with hypoxia and possible urinary tract infection.  Patient was discharged from SNF to home on the day of admission.  Reportedly saturating in the 70s on room air when EMS arrived.  CTA chest negative for PE or other pulmonary finding but cardiomegaly.  BNP 259.  RVP negative. ? ?Patient was started on ceftriaxone and low-dose prednisone.  Respiratory failure resolved.  Urine culture with ESBL E. coli and Pseudomonas aeruginosa.  It is difficult to tell if this is UTI or colonization from chronic Foley.  Suprapubic Foley exchanged on 5/10.  Antibiotics changed to IV meropenem on 5/10.  Completed antibiotic course with fosfomycin 3 g on 5/12. ? ?Hospital course significant for intermittent confusion and agitation likely due to delirium that seems to have resolved.  However, he remains at risk of delirium.  Recommend frequent reorientation and delirium precautions. ? ?Therapy recommended  SNF but insurance declined even after peer to peer review due to his chronic nonambulatory status without significant improvement with previous SNF stay.  He is discharged home with home health and DME.    ? ?See individual problem list below for more on hospital course. ? ?Problems addressed during this hospitalization ?Problem  ?Acute Respiratory Failure With Hypoxia (Hcc)  ?Delirium in patient with underlying cognitive impairment  ?Catheter-Associated Urinary Tract Infection (Hcc)  ?History of CVA/aphasia  ?Chronic combined CHF  ?History of CAD/CABG  ?S/P Aka (Above Knee Amputation), Right (Hcc)  ?Cognitive Impairment  ?Goals of Care, Counseling/Discussion  ?Protein-calorie malnutrition, severe  ?Chronic Indwelling Foley Catheter  ?Htn (Hypertension)  ?  ?Assessment and Plan: ?* Acute respiratory failure with hypoxia (HCC) ?Reportedly desaturated to 70s when EMS arrived.  Unclear etiology but concern about possible COPD given improvement with low-dose prednisone.  CTA chest negative for PE or pulmonary finding but cardiomegaly.  He has known combined CHF. Respiratory failure resolved.  Saturating at 100% on RA. ?-Aspiration precaution ?-Continue albuterol as needed ? ?Delirium in patient with underlying cognitive impairment ?Seems to have resolved. ?-Delirium precaution and reorientation ?-Continue Depakote and Seroquel ? ?Catheter-associated urinary tract infection ([name]) ?Recently hospitalized for ESBL UTI and discharged to SNF.  Urine culture with ESBL E. coli and Pseudomonas aeruginosa.  Difficult to tell if this is true UTI or colonization from chronic Foley but patient had some mental status change.  ?-Ceftriaxone 5/9-5/10>>> meropenem 5/10-5/12>> fosfomycin 5/12 ?-Changed suprapubic catheter on 5/10 ? ?History of CVA/aphasia ?Stable. ?-Continue home medication ? ?Chronic combined CHF ?TTE with LVEF of 20 to 25% (previously 30 to 35%), GH, G2-DD, severe LAE, moderate MVR.  He does not seem to be on diuretics  on GDMT at home.  Appears euvolemic on exam.  ?-Changed amlodipine to low-dose Imdur and hydralazine ?-Consider outpatient follow-up with cardiology ? ? ?History of CAD/CABG ?Patient denies chest pain.  TTE as above.  Serial troponin negative. ?-Started Imdur as above. ? ?S/P AKA (above knee amputation), right ([place]) ?Stable. ?-PT/OT and fall precaution ? ?Cognitive impairment ?See delirium. ? ?Goals of care, counseling/discussion ?Remains full code.  Palliative following. ? ?Protein-calorie malnutrition, severe ?Nutrition Status: ?Nutrition Problem: Severe Malnutrition ?Etiology: chronic illness ?Signs/Symptoms: severe fat depletion, severe muscle depletion ?Interventions: Ensure Enlive (each supplement provides 350kcal and 20 grams of protein), Juven, MVI ? ?HTN (hypertension) ?Normotensive.  We will change cardiac meds as above ? ?Chronic indwelling Foley catheter ?Exchanged Foley on 5/10. ? ? 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 Subcutaneous fat may be visible but bone, tendon or muscle are NOT exposed. (Active)  ?09/26/21 1800  ?Location: Sacrum  ?Location Orientation:   ?Staging: Stage 3 -  Full thickness tissue loss. Subcutaneous fat may be visible but bone, tendon or muscle are NOT exposed.  ?Wound Description (Comments):   ?Present on Admission: Yes  ?Dressing Type Foam - Lift dressing to assess site every shift 10/19/21 0930  ? ? ?Vital signs ?Vitals:  ? 10/18/21 1247 10/18/21 1554 10/19/21 0459 10/19/21 0500  ?BP: 105/62 (!) 110/57 126/65   ?Pulse: 73  65   ?Temp: 97.9 ?F (36.6 ?C)  (!) 97.5 ?F (36.4 ?C)   ?Resp: 16  20   ?Height:      ?Weight:    49.1 kg  ?SpO2: 100%  100%   ?TempSrc: Oral  Oral   ?BMI (Calculated):    14.68  ?  ? ?Discharge exam ? ?GENERAL: Appears frail.  Nontoxic. ?HEENT: MMM.  Vision and hearing grossly intact.  ?NECK: Supple.  No apparent JVD.  ?RESP: 100% on RA.  No IWOB.  Fair aeration bilaterally. ?CVS:  RRR. Heart  sounds normal.  ?ABD/GI/GU: BS+. Abd soft, NTND.  Suprapubic Foley.  Clear urine in the bag. ?MSK/EXT:  Moves extremities.  Right AKA.  Significant muscle mass and subcu fat loss. ?SKIN: Sacral decubitus.  No drainage or signs of infection. ?NEURO: Awake and alert.  Oriented to self and place.  Follows commands. ?PSYCH: Calm. Normal affect.  ? ?Discharge Instructions ?Discharge Instructions   ? ? Diet - low sodium heart healthy   Complete by: As directed ?  ? Discharge wound care:   Complete by: As directed ?  ? Stage III sacral decubitus-Daily foam dressings and monitoring  ? Increase activity slowly   Complete by: As directed ?  ? ?  ? ?Allergies as of 10/19/2021   ? ?   Reactions  ? Food Anaphylaxis, Rash  ? Clams  ? Aspirin Itching  ? Crestor [rosuvastatin Calcium] Other (See Comments)  ? Muscle pain  ? [employer] (m. [place]en[place]) Skin Test Itching  ? Penicillins Itching  ? ?  ? ?  ?Medication List  ?  ? ?STOP taking these medications   ? ?acidophilus Caps capsule ?  ?amLODipine 10 MG tablet ?Commonly known as: NORVASC ?  ?HYDROcodone-acetaminophen 5-325 MG tablet ?Commonly known as: Norco ?  ?LACTOBACILLUS PO ?  ? ?  ? ?TAKE these medications   ? ?acetaminophen 500 MG tablet ?Commonly known as: TYLENOL ?Take 2 tablets (1,000 mg total) by mouth every 8 (eight) hours as needed for up to 10 days for mild pain, moderate pain or headache. ?  ?  albuterol 108 (90 Base) MCG/ACT inhaler ?Commonly known as: VENTOLIN HFA ?Inhale 2 puffs into the lungs every 6 (six) hours as needed for wheezing or shortness of breath. ?  ?ascorbic acid 500 MG tablet ?Commonly known as: VITAMIN C ?Take 1 tablet (500 mg total) by mouth 2 (two) times daily. ?  ?clopidogrel 75 MG tablet ?Commonly known as: PLAVIX ?Take 1 tablet (75 mg total) by mouth at bedtime. ?  ?divalproex 125 MG DR tablet ?Commonly known as: DEPAKOTE ?Take 1 tablet (125 mg total) by mouth at bedtime. ?  ?docusate sodium 250 MG capsule ?Commonly known as:  COLACE ?Take 250 mg by mouth daily. ?  ?feeding supplement Liqd ?Take 237 mLs by mouth 3 (three) times daily between meals. ?  ?hydrALAZINE 10 MG tablet ?Commonly known as: APRESOLINE ?Take 1 tablet (10 mg total) by

## 2021-10-19 NOTE — Progress Notes (Signed)
Physical Therapy Treatment ?Patient Details ?Name: Todd Dunn ?MRN: [id number] ?DOB: August 12, 1940 ?Today's Date: 10/19/2021 ? ? ?History of Present Illness Patient is a 81 y.o. male who presented with worsening of suprapubic abdominal pain and SOB PMH: indwelling suprapubic urinary catheter secondary to severe BPH, CAD, PVD status post right AKA, SSS status post ICD, HTN, CVA with chronic aphasia, ? ?  ?PT Comments  ? ? General Comments: AxO x 1 following repeat functional commands 50% of time.  Pleasant. Difficulty with speech and word finding but compensates with hand gestures.  He did say he was hungry and wanted [employer] and Chocolate Milk. ?Assisted OOB to [facility] was difficult and required + 2 assist.   General bed mobility comments: Max Assist to transition to EOB using bed pad and rails.  Pt likes to hold the rails.General transfer comment: first asissted from bed to [facility] due to incont stools then from [facility] to recliner. Positioned to comfort.   ?Rec nursing use STEDY for back to bed. ?Pt will need ST Rehab at SNF prior to returning home.    ?Recommendations for follow up therapy are one component of a multi-disciplinary discharge planning process, led by the attending physician.  Recommendations may be updated based on patient status, additional functional criteria and insurance authorization. ? ?Follow Up Recommendations ? Skilled nursing-short term rehab (<3 hours/day) ?  ?  ?Assistance Recommended at Discharge Frequent or constant Supervision/Assistance  ?Patient can return home with the following Two people to help with walking and/or transfers;Two people to help with bathing/dressing/bathroom;Assistance with cooking/housework;Assistance with feeding;Direct supervision/assist for financial management;Direct supervision/assist for medications management;Help with stairs or ramp for entrance;Assist for transportation ?  ?Equipment Recommendations ? Hospital bed;Wheelchair cushion (measurements  PT);Wheelchair (measurements PT)  ?  ?Recommendations for Other Services   ? ? ?  ?Precautions / Restrictions Precautions ?Precautions: Fall ?Precaution Comments: suprapubic cath (Chronic) ?Restrictions ?Weight Bearing Restrictions: No ?Other Position/Activity Restrictions: R AKA/no prosthesis  ?  ? ?Mobility ? Bed Mobility ?Overal bed mobility: Needs Assistance ?Bed Mobility: Supine to Sit ?  ?  ?Supine to sit: Max assist ?  ?  ?General bed mobility comments: Max Assist to transition to EOB using bed pad and rails.  Pt likes to hold the rails. ?  ? ?Transfers ?Overall transfer level: Needs assistance ?Equipment used: 2 person hand held assist ?Transfers: Bed to chair/wheelchair/BSC ?Sit to Stand: +2 physical assistance, +2 safety/equipment, Mod assist, Max assist ?Stand pivot transfers: Max assist, +2 physical assistance, +2 safety/equipment ?  ?  ?  ?  ?General transfer comment: first asissted from bed to [facility] due to incont stools then from [facility] to recliner. ?  ? ?Ambulation/Gait ?  ?  ?  ?  ?  ?  ?  ?General Gait Details: non amb ? ? ?Stairs ?  ?  ?  ?  ?  ? ? ?Wheelchair Mobility ?  ? ?Modified Rankin (Stroke Patients Only) ?  ? ? ?  ?Balance   ?  ?  ?  ?  ?  ?  ?  ?  ?  ?  ?  ?  ?  ?  ?  ?  ?  ?  ?  ? ?  ?Cognition Arousal/Alertness: Awake/alert ?Behavior During Therapy: Restless, Impulsive ?Overall Cognitive Status: No family/caregiver present to determine baseline cognitive functioning ?  ?  ?  ?  ?  ?  ?  ?  ?  ?  ?  ?  ?  ?  ?  ?  ?  General Comments: AxO x 1 following repeat functional commands 50% of time.  Pleasant. ?  ?  ? ?  ?Exercises   ? ?  ?General Comments   ?  ?  ? ?Pertinent Vitals/Pain Pain Assessment ?Pain Assessment: No/denies pain  ? ? ?Home Living   ?  ?  ?  ?  ?  ?  ?  ?  ?  ?   ?  ?Prior Function    ?  ?  ?   ? ?PT Goals (current goals can now be found in the care plan section) Progress towards PT goals: Progressing toward goals ? ?  ?Frequency ? ? ? Min 2X/week ? ? ? ?  ?PT Plan Current plan  remains appropriate  ? ? ?Co-evaluation   ?  ?  ?  ?  ? ?  ?AM-PAC PT "6 Clicks" Mobility   ?Outcome Measure ? Help needed turning from your back to your side while in a flat bed without using bedrails?: Total ?Help needed moving from lying on your back to sitting on the side of a flat bed without using bedrails?: Total ?Help needed moving to and from a bed to a chair (including a wheelchair)?: Total ?Help needed standing up from a chair using your arms (e.g., wheelchair or bedside chair)?: Total ?Help needed to walk in hospital room?: Total ?Help needed climbing 3-5 steps with a railing? : Total ?6 Click Score: 6 ? ?  ?End of Session Equipment Utilized During Treatment: Gait belt ?Activity Tolerance: Patient tolerated treatment well ?Patient left: in chair;with call bell/phone within reach;with chair alarm set ?Nurse Communication: Mobility status ?PT Visit Diagnosis: Other abnormalities of gait and mobility (R26.89) ?  ? ? ?Time: 3267-1245 ?PT Time Calculation (min) (ACUTE ONLY): 29 min ? ?Charges:  $Therapeutic Activity: 23-37 mins          ?          ? ?Rica Koyanagi  PTA ?Acute  Rehabilitation Services ?Pager      [phone] ?Office      [phone] ? ?

## 2021-10-20 ENCOUNTER — Telehealth: Payer: Self-pay

## 2021-10-20 NOTE — Telephone Encounter (Signed)
Transition Care Management Follow-up Telephone Call ? ?Call completed with patient's granddaughter, [name].  ?Date of discharge and from where: 10/19/2021< [facility] ?How have you been since you were released from the hospital? He is doing okay at present time. [name] is worried about his breathing and is planning to purchase a pulse oximeter so she can monitor his O2 sat  ?Any questions or concerns? Yes - she said she started to complete a POA when he was in the hospital but could not remember who she spoke to and what the status of the document is. I instructed her to call the hospital chaplain's office for assistance.  ? ?Items Reviewed: ?Did the pt receive and understand the discharge instructions provided? Yes  ?Medications obtained and verified?  I reviewed the medication list with her and she is missing the following medications: albuterol inhaler, ascorbic acid, colace, hydralazine, quetiapine senokot-s, vitamin D.  She said these medications were not sent to the pharmacy and she is requesting PCP to re-send.  She has all of the other meds.   ?Other? No  ?Any new allergies since your discharge? No  ?Dietary orders reviewed? Yes - she plans to purchase the feeding supplement.  ?Do you have support at home? Yes - he stays with her sister and family. [name] said that he is never left alone.  ? ?Home Care and Equipment/Supplies: ?Were home health services ordered? yes ?If so, what is the name of the agency? [facility]  ?Has the agency set up a time to come to the patient's home? No, not yet ?Were any new equipment or medical supplies ordered?  Yes: wheelchair ?What is the name of the medical supply agency? [facility]  ?Were you able to get the supplies/equipment? They are waiting for delivery today ?Do you have any questions related to the use of the equipment or supplies? No ? ?He already has a hospital bed.  ? ?Functional Questionnaire: (I = Independent and D = Dependent) ?ADLs: dependent for  ADLs. [name] said he is a 2 person transfer however his aide is able to transfer him alone. [name] manages his medications  ?He has an aide 7-8 hours/day: M/T/Th/F.  [name] is not sure what program provides this service.  ? ? ?Follow up appointments reviewed: ? ?PCP Hospital f/u appt confirmed? Yes  Scheduled to see [name], NP - 10/23/2021.  ?[facility] f/u appt confirmed? Yes  Scheduled to see GI - 11/10/2021. He is followed by [facility] for supra-pubic catheter changes.  ?Are transportation arrangements needed? No  ?If their condition worsens, is the pt aware to call PCP or go to the Emergency Dept.? Yes ?Was the patient provided with contact information for the PCP's office or ED? Yes ?Was to pt encouraged to call back with questions or concerns? Yes ? ?

## 2021-10-21 MED ORDER — QUETIAPINE FUMARATE 50 MG PO TABS: 50.0000 mg | ORAL_TABLET | Freq: Two times a day (BID) | ORAL | 0 refills | Status: DC

## 2021-10-21 MED ORDER — VITAMIN D (ERGOCALCIFEROL) 1.25 MG (50000 UNIT) PO CAPS: 50000.0000 [IU] | ORAL_CAPSULE | ORAL | 0 refills | Status: DC

## 2021-10-21 MED ORDER — ASCORBIC ACID 500 MG PO TABS: 500.0000 mg | ORAL_TABLET | Freq: Two times a day (BID) | ORAL | 0 refills | Status: DC

## 2021-10-21 MED ORDER — SENNOSIDES-DOCUSATE SODIUM 8.6-50 MG PO TABS: 2.0000 | ORAL_TABLET | Freq: Two times a day (BID) | ORAL | 0 refills | Status: DC

## 2021-10-21 MED ORDER — ALBUTEROL SULFATE HFA 108 (90 BASE) MCG/ACT IN AERS: 2.0000 | INHALATION_SPRAY | Freq: Four times a day (QID) | RESPIRATORY_TRACT | 0 refills | Status: DC | PRN

## 2021-10-21 MED ORDER — DOCUSATE SODIUM 250 MG PO CAPS: 250.0000 mg | ORAL_CAPSULE | Freq: Every day | ORAL | 0 refills | Status: DC

## 2021-10-21 NOTE — Telephone Encounter (Signed)
I called patient's granddaughter, Todd Dunn # [phone] to inform her that med refills have been sent to patient's pharmacy.unable to reach her, the recording stated that the call could not be completed at this time  ?

## 2021-10-21 NOTE — Telephone Encounter (Signed)
Refills sent

## 2021-10-22 NOTE — Telephone Encounter (Signed)
I spoke to patient's granddaughter, [name], and informed  her that med refills have been sent to patient's pharmacy. She was very [employer]

## 2021-10-23 ENCOUNTER — Ambulatory Visit: Payer: Medicare Other | Attending: Nurse Practitioner | Admitting: Nurse Practitioner

## 2021-10-23 VITALS — BP 132/67 | HR 68

## 2021-10-23 DIAGNOSIS — Z23 Encounter for immunization: Secondary | ICD-10-CM

## 2021-10-23 DIAGNOSIS — Z09 Encounter for follow-up examination after completed treatment for conditions other than malignant neoplasm: Secondary | ICD-10-CM

## 2021-10-23 NOTE — Progress Notes (Signed)
Assessment & Plan:  Ronnie was seen today for hospitalization follow-up.  Diagnoses and all orders for this visit:  Hospital discharge follow-up Follow up in 3 months. Patient's PCA was given the number for heart care to have his niece call for appt.   Need for pneumococcal vaccine -     Pneumococcal conjugate vaccine 20-valent    Patient has been counseled on age-appropriate routine health concerns for screening and prevention. These are reviewed and up-to-date. Referrals have been placed accordingly. Immunizations are up-to-date or declined.    Subjective:   Chief Complaint  Patient presents with   Hospitalization Follow-up   HPI Todd Dunn 81 y.o. male presents to office today accompanied by a personal care assistant. 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   HFU Admitted for 7 days with shortness of breath, hypoxia to 70s, suprapubic pain, acute respiratory failure with hypoxia and UTI. CTA chest negative for PE or other pulmonary finding but cardiomegaly.  BNP 259.   He was started on ceftriaxone and low-dose prednisone.  Respiratory failure resolved.  Urine culture with ESBL E. coli and Pseudomonas aeruginosa. Suprapubic Foley exchanged on 5/10.  Antibiotics changed  to IV meropenem on 5/10.  Completed antibiotic course with fosfomycin 3 g on 5/12. Unfortunately Therapy recommended SNF but insurance declined even after peer to peer review due to his chronic nonambulatory status without significant improvement with previous SNF stay.  He was discharged home with home health and DME.    He has an appt with [facility] in July.     Review of Systems  Constitutional:  Negative for fever, malaise/fatigue and weight loss.  HENT: Negative.  Negative for nosebleeds.   Eyes: Negative.  Negative for blurred vision, double vision and photophobia.  Respiratory: Negative.  Negative for cough and shortness of breath.   Cardiovascular: Negative.  Negative for chest pain, palpitations and leg swelling.  Gastrointestinal: Negative.  Negative for heartburn, nausea and vomiting.  Musculoskeletal: Negative.  Negative for myalgias.  Neurological:  Positive for speech change and weakness. Negative for dizziness, focal weakness, seizures and headaches.  Psychiatric/Behavioral: Negative.  Negative for suicidal ideas.    Past Medical History:  Diagnosis Date   BPH (benign prostatic hyperplasia)    CAD (coronary artery disease)    Chronic indwelling Foley catheter    CVA (cerebral vascular accident) ([place])    GERD (gastroesophageal reflux disease)    HTN (hypertension)    Neurogenic bladder    SSS (sick sinus syndrome) ([place])    s/p MDT ICD   Vitamin D deficiency     Past Surgical History:  Procedure Laterality Date   ABDOMINAL AORTOGRAM W/LOWER EXTREMITY N/A 03/23/2021   Procedure: ABDOMINAL AORTOGRAM W/LOWER EXTREMITY;  Surgeon: Waynetta Sandy, MD;  Location: [facility];  Service: Cardiovascular;  Laterality: N/A;   AMPUTATION Right 04/01/2021   Procedure: RIGHT GREAT TOE AMPUTATION;  Surgeon: Newt Minion, MD;  Location: [facility];  Service: Orthopedics;  Laterality: Right;   AMPUTATION Right 07/30/2021   Procedure: AMPUTATION ABOVE KNEE;   Surgeon: Broadus John, MD;  Location: [facility];  Service: Vascular;  Laterality: Right;   APPENDECTOMY     APPLICATION OF WOUND VAC Right 04/01/2021   Procedure: APPLICATION OF WOUND VAC;  Surgeon: Newt Minion, MD;  Location: [facility];  Service: Orthopedics;  Laterality: Right;   CARDIAC SURGERY     FEMORAL-TIBIAL BYPASS GRAFT Right 03/26/2021   Procedure: BYPASS GRAFT FEMORAL-TIBIAL ARTERY.  Harvest right leg saphenous vein, right vein patch angioplasty posterior tibial.;  Surgeon: Marty Heck, MD;  Location: [facility];  Service: Vascular;  Laterality: Right;   IR US GUIDE BX ASP/DRAIN  08/03/2021   IR US GUIDE BX ASP/DRAIN  08/06/2021   PACEMAKER PLACEMENT      Family History  Family history unknown: Yes    Social History Reviewed with no changes to be made today.   Outpatient Medications Prior to Visit  Medication Sig Dispense Refill   acetaminophen (TYLENOL) 500 MG tablet Take 2 tablets (1,000 mg total) by mouth every 8 (eight) hours as needed for up to 10 days for mild pain, moderate pain or headache. 60 tablet 0   albuterol (VENTOLIN HFA) 108 (90 Base) MCG/ACT inhaler Inhale 2 puffs into the lungs every 6 (six) hours as needed for wheezing or shortness of breath. 6.7 g 0   ascorbic acid (VITAMIN C) 500 MG tablet Take 1 tablet (500 mg total) by mouth 2 (two) times daily. 60 tablet 0   clopidogrel (PLAVIX) 75 MG tablet Take 1 tablet (75 mg total) by mouth at bedtime. 90 tablet 3   docusate sodium (COLACE) 250 MG capsule Take 1 capsule (250 mg total) by mouth daily. 30 capsule 0   feeding supplement (ENSURE ENLIVE / ENSURE PLUS) LIQD Take 237 mLs by mouth 3 (three) times daily between meals. 237 mL 12   hydrALAZINE (APRESOLINE) 10 MG tablet Take 1 tablet (10 mg total) by mouth every 8 (eight) hours. 90 tablet 1   isosorbide mononitrate (IMDUR) 30 MG 24 hr tablet Take 0.5 tablets (15 mg total) by mouth daily. 30 tablet 1   nitroGLYCERIN (NITROSTAT) 0.4 MG SL tablet Place 0.4 mg under  the tongue every 5 (five) minutes x 3 doses as needed for chest pain.     oxybutynin (DITROPAN-XL) 5 MG 24 hr tablet Take 5 mg by mouth daily.     pantoprazole (PROTONIX) 40 MG tablet Take 40 mg by mouth daily.     polyethylene glycol (MIRALAX / GLYCOLAX) 17 g packet Take 17 g by mouth daily as needed for mild constipation. 14 each 0   QUEtiapine (SEROQUEL) 50 MG tablet Take 1 tablet (50 mg total) by mouth 2 (two) times daily. 60 tablet 0   senna-docusate (SENOKOT-S) 8.6-50 MG tablet Take 2 tablets by mouth 2 (two) times daily. 60 tablet 0   vitamin B-12 (CYANOCOBALAMIN) 1000 MCG tablet Take 1 tablet (1,000 mcg total) by mouth daily. 30 tablet 3   Vitamin D, Ergocalciferol, (DRISDOL) 1.25 MG (50000 UNIT) CAPS capsule Take 1 capsule (50,000 Units total) by mouth every Monday. 4 capsule 0   zinc sulfate 220 (50 Zn) MG capsule Take 1 capsule (220 mg total) by mouth daily. 30 capsule 1   divalproex (DEPAKOTE) 125  MG DR tablet Take 1 tablet (125 mg total) by mouth at bedtime. 90 tablet 1   No facility-administered medications prior to visit.    Allergies  Allergen Reactions   Food Anaphylaxis and Rash    Clams   Aspirin Itching   Crestor [Rosuvastatin Calcium] Other (See Comments)    Muscle pain   [place] Clam (M. Mercenaria) Skin Test Itching   Penicillins Itching       Objective:    BP 132/67   Pulse 68   SpO2 93%  Wt Readings from Last 3 Encounters:  10/19/21 108 lb 3.9 oz (49.1 kg)  10/02/21 130 lb 1.1 oz (59 kg)  08/11/21 130 lb 1.1 oz (59 kg)    Physical Exam Vitals and nursing note reviewed.  Constitutional:      Appearance: He is well-developed.  HENT:     Head: Normocephalic and atraumatic.  Cardiovascular:     Rate and Rhythm: Normal rate and regular rhythm.     Heart sounds: Normal heart sounds. No murmur heard.   No friction rub. No gallop.  Pulmonary:     Effort: Pulmonary effort is normal. No tachypnea or respiratory distress.     Breath sounds:  Normal breath sounds. No decreased breath sounds, wheezing, rhonchi or rales.  Chest:     Chest wall: No tenderness.  Abdominal:     General: Bowel sounds are normal.     Palpations: Abdomen is soft.  Musculoskeletal:        General: Normal range of motion.     Cervical back: Normal range of motion.     Right Lower Extremity: Right leg is amputated above knee.  Skin:    General: Skin is warm and dry.  Neurological:     Mental Status: He is alert and oriented to person, place, and time.     Coordination: Coordination normal.  Psychiatric:        Behavior: Behavior is cooperative.         Patient has been counseled extensively about nutrition and exercise as well as the importance of adherence with medications and regular follow-up. The patient was given clear instructions to go to ER or return to medical center if symptoms don't improve, worsen or new problems develop. The patient verbalized understanding.   Follow-up: Return in about 3 months (around 01/23/2022).   [name], FNP-BC [facility] and [place], [place]   10/30/2021, 11:18 PM

## 2021-10-23 NOTE — Patient Instructions (Signed)
  [facility] [employer] [place] Ph# [phone]

## 2021-10-30 ENCOUNTER — Encounter: Payer: Self-pay | Admitting: Nurse Practitioner

## 2021-11-10 ENCOUNTER — Ambulatory Visit: Payer: Medicare Other | Admitting: Internal Medicine

## 2021-11-16 ENCOUNTER — Other Ambulatory Visit: Payer: Self-pay | Admitting: Family Medicine

## 2021-11-17 MED ORDER — VITAMIN D (ERGOCALCIFEROL) 1.25 MG (50000 UNIT) PO CAPS: 50000.0000 [IU] | ORAL_CAPSULE | ORAL | 0 refills | Status: AC

## 2021-11-17 MED ORDER — ASCORBIC ACID 500 MG PO TABS: 500.0000 mg | ORAL_TABLET | Freq: Two times a day (BID) | ORAL | 0 refills | Status: DC

## 2021-11-17 MED ORDER — SENNOSIDES-DOCUSATE SODIUM 8.6-50 MG PO TABS: 2.0000 | ORAL_TABLET | Freq: Two times a day (BID) | ORAL | 0 refills | Status: DC

## 2021-11-18 ENCOUNTER — Emergency Department (HOSPITAL_COMMUNITY): Payer: Medicare Other

## 2021-11-18 ENCOUNTER — Encounter (HOSPITAL_COMMUNITY): Payer: Self-pay

## 2021-11-18 ENCOUNTER — Other Ambulatory Visit: Payer: Self-pay | Admitting: Family Medicine

## 2021-11-18 ENCOUNTER — Emergency Department (HOSPITAL_COMMUNITY)
Admission: EM | Admit: 2021-11-18 | Discharge: 2021-11-18 | Disposition: A | Discharge status: Home / Self Care | Payer: Medicare Other | Attending: Emergency Medicine | Admitting: Emergency Medicine

## 2021-11-18 ENCOUNTER — Other Ambulatory Visit: Payer: Self-pay

## 2021-11-18 DIAGNOSIS — R4701 Aphasia: Secondary | ICD-10-CM | POA: Insufficient documentation

## 2021-11-18 DIAGNOSIS — F0390 Unspecified dementia without behavioral disturbance: Secondary | ICD-10-CM | POA: Diagnosis not present

## 2021-11-18 DIAGNOSIS — R109 Unspecified abdominal pain: Secondary | ICD-10-CM | POA: Insufficient documentation

## 2021-11-18 DIAGNOSIS — Z7902 Long term (current) use of antithrombotics/antiplatelets: Secondary | ICD-10-CM | POA: Diagnosis not present

## 2021-11-18 DIAGNOSIS — R197 Diarrhea, unspecified: Secondary | ICD-10-CM | POA: Diagnosis not present

## 2021-11-18 LAB — COMPREHENSIVE METABOLIC PANEL
ALT: 12 U/L (ref 0–44)
AST: 17 U/L (ref 15–41)
Albumin: 3.2 g/dL — ABNORMAL LOW (ref 3.5–5.0)
Alkaline Phosphatase: 126 U/L (ref 38–126)
Anion gap: 3 — ABNORMAL LOW (ref 5–15)
BUN: 15 mg/dL (ref 8–23)
CO2: 27 mmol/L (ref 22–32)
Calcium: 9.8 mg/dL (ref 8.9–10.3)
Chloride: 111 mmol/L (ref 98–111)
Creatinine, Ser: 0.76 mg/dL (ref 0.61–1.24)
GFR, Estimated: >60 mL/min (ref >60)
Glucose, Bld: 122 mg/dL — ABNORMAL HIGH (ref 70–99)
Potassium: 3.9 mmol/L (ref 3.5–5.1)
Sodium: 141 mmol/L (ref 135–145)
Total Bilirubin: 0.7 mg/dL (ref 0.3–1.2)
Total Protein: 6.7 g/dL (ref 6.5–8.1)

## 2021-11-18 LAB — CBC
HCT: 44 % (ref 39.0–52.0)
Hemoglobin: 13.7 g/dL (ref 13.0–17.0)
MCH: 28.5 pg (ref 26.0–34.0)
MCHC: 31.1 g/dL (ref 30.0–36.0)
MCV: 91.5 fL (ref 80.0–100.0)
Platelets: 171 10*3/uL (ref 150–400)
RBC: 4.81 MIL/uL (ref 4.22–5.81)
RDW: 15.2 % (ref 11.5–15.5)
WBC: 3.8 10*3/uL — ABNORMAL LOW (ref 4.0–10.5)
nRBC: 0 % (ref 0.0–0.2)

## 2021-11-18 MED ORDER — SODIUM CHLORIDE 0.9 % IV BOLUS
500.0000 mL | Freq: Once | INTRAVENOUS | Status: AC
  Administered 2021-11-18: 500 mL via INTRAVENOUS

## 2021-11-18 MED ORDER — MORPHINE SULFATE (PF) 2 MG/ML IV SOLN
2.0000 mg | Freq: Once | INTRAVENOUS | Status: AC
  Administered 2021-11-18: 2 mg via INTRAVENOUS
  Filled 2021-11-18: qty 1

## 2021-11-18 MED ORDER — IOHEXOL 300 MG/ML  SOLN
100.0000 mL | Freq: Once | INTRAMUSCULAR | Status: AC | PRN
  Administered 2021-11-18: 100 mL via INTRAVENOUS

## 2021-11-18 MED ORDER — ONDANSETRON HCL 4 MG/2ML IJ SOLN
4.0000 mg | Freq: Once | INTRAMUSCULAR | Status: AC
Start: 2021-11-18 — End: 2021-11-18
  Administered 2021-11-18: 4 mg via INTRAVENOUS
  Filled 2021-11-18: qty 2

## 2021-11-18 NOTE — ED Provider Triage Note (Signed)
Emergency Medicine Provider Triage Evaluation Note  Todd Dunn , a 81 y.o. male  was evaluated in triage.  Pt brought in by daughter for concern for UTI.  Has indwelling suprapubic catheter.  Was recently seen by urology, and was told that he had a UTI.  Was prescribed an antibiotic, as he is resistant to many of them, and it was about $360.  His daughters were unable to afford that.  They state that his bag has not been changed in over a month.  For the past day or so he has been more confused and unable to control his bowel movements, having bowel incontinence.  Review of Systems  Positive: Urinary symptoms, bowel incontinence, confusion Negative: Abdominal pain  Physical Exam  BP (!) 145/68 (BP Location: Right Arm)   Pulse (!) 57   Temp 98.3 F (36.8 C) (Axillary)   Resp 16   Ht 6' (1.829 m)   Wt 50 kg   SpO2 100%   BMI 14.95 kg/m  Gen:   Awake, no distress   Resp:  Normal effort  MSK:   Moves extremities without difficulty  Other:    Medical Decision Making  Medically screening exam initiated at 10:30 AM.  Appropriate orders placed.  Sandip L Wilkerson was informed that the remainder of the evaluation will be completed by another provider, this initial triage assessment does not replace that evaluation, and the importance of remaining in the ED until their evaluation is complete.  Will initiate AMS vs infection workup   Lavona Norsworthy T, PA-C 11/18/21 1032

## 2021-11-18 NOTE — ED Notes (Signed)
Pts heart rate is irregular

## 2021-11-18 NOTE — ED Triage Notes (Signed)
Pt arrived POV from home per the family pt has not been able to control his bowel movements and has had abdominal pain. Per family he was seen at the urologist Monday and they told the family he probably has an infection. Per family pt is more confused.

## 2021-11-18 NOTE — ED Provider Notes (Signed)
[facility] EMERGENCY DEPARTMENT Provider Note   CSN: [id number] Arrival date & time: 11/18/21  0941     History  Chief Complaint  Patient presents with   Abdominal Pain    Todd Dunn is a 81 y.o. male.  Patient presents to ER chief complaint reported from EMS and nursing staff of abdominal pain, loose stools, increasing confusion episodes.  Was recently discharged last month with concern for possible UTI with acute respiratory failure that has since resolved.  Lives at home with his family.  Unable to obtain additional history from the patient as he is severely aphasic and has history of dementia at baseline.  No family present at bedside.       Home Medications Prior to Admission medications   Medication Sig Start Date End Date Taking? Authorizing Provider  albuterol (VENTOLIN HFA) 108 (90 Base) MCG/ACT inhaler INHALE 2 PUFFS INTO THE LUNGS EVERY 6 HOURS AS NEEDED FOR WHEEZING OR SHORTNESS OF BREATH 11/18/21   Charlott Rakes, MD  ascorbic acid (VITAMIN C) 500 MG tablet Take 1 tablet (500 mg total) by mouth 2 (two) times daily. 11/17/21   [name], NP  clopidogrel (PLAVIX) 75 MG tablet Take 1 tablet (75 mg total) by mouth at bedtime. 07/17/21   [name], NP  divalproex (DEPAKOTE) 125 MG DR tablet Take 1 tablet (125 mg total) by mouth at bedtime. 07/17/21 10/15/21  [name], NP  docusate sodium (COLACE) 250 MG capsule Take 1 capsule (250 mg total) by mouth daily. 10/21/21   Charlott Rakes, MD  feeding supplement (ENSURE ENLIVE / ENSURE PLUS) LIQD Take 237 mLs by mouth 3 (three) times daily between meals. 10/03/21   Bonnielee Haff, MD  hydrALAZINE (APRESOLINE) 10 MG tablet Take 1 tablet (10 mg total) by mouth every 8 (eight) hours. 10/19/21   Mercy Riding, MD  isosorbide mononitrate (IMDUR) 30 MG 24 hr tablet Take 0.5 tablets (15 mg total) by mouth daily. 10/19/21   Mercy Riding, MD  nitroGLYCERIN (NITROSTAT) 0.4 MG SL tablet Place 0.4 mg  under the tongue every 5 (five) minutes x 3 doses as needed for chest pain.    [provider]  oxybutynin (DITROPAN-XL) 5 MG 24 hr tablet Take 5 mg by mouth daily.    [provider]  pantoprazole (PROTONIX) 40 MG tablet Take 40 mg by mouth daily.    [provider]  polyethylene glycol (MIRALAX / GLYCOLAX) 17 g packet Take 17 g by mouth daily as needed for mild constipation. 07/17/21   [name], NP  QUEtiapine (SEROQUEL) 50 MG tablet Take 1 tablet (50 mg total) by mouth 2 (two) times daily. 10/21/21   Charlott Rakes, MD  senna-docusate (SENOKOT-S) 8.6-50 MG tablet Take 2 tablets by mouth 2 (two) times daily. 11/17/21   [name], NP  vitamin B-12 (CYANOCOBALAMIN) 1000 MCG tablet Take 1 tablet (1,000 mcg total) by mouth daily. 07/17/21   [name], NP  Vitamin D, Ergocalciferol, (DRISDOL) 1.25 MG (50000 UNIT) CAPS capsule Take 1 capsule (50,000 Units total) by mouth every Monday. 11/23/21   [name], NP  zinc sulfate 220 (50 Zn) MG capsule Take 1 capsule (220 mg total) by mouth daily. 10/19/21   Mercy Riding, MD      Allergies    Food, Aspirin, Crestor [rosuvastatin calcium], [place] clam (m. mercenaria) skin test, and Penicillins    Review of Systems   Review of Systems  Unable to perform ROS: Dementia    Physical Exam Updated Vital Signs BP 132/82 (BP Location: Right Arm)   Pulse 72   Temp 98 F (36.7 C) (Oral)   Resp 20   Ht 6' (1.829 m)   Wt 50 kg   SpO2 98%   BMI 14.95 kg/m  Physical Exam Constitutional:      Appearance: He is well-developed.  HENT:     Head: Normocephalic.     Nose: Nose normal.  Eyes:     Extraocular Movements: Extraocular movements intact.  Cardiovascular:     Rate and Rhythm: Normal rate.  Pulmonary:     Effort: Pulmonary effort is normal.  Abdominal:     Tenderness: There is no abdominal tenderness. There is no guarding or rebound.  Skin:    Coloration: Skin is not jaundiced.   Neurological:     Mental Status: He is alert. Mental status is at baseline.     Comments: Severely aphasic, cannot comprehend what the patient is saying compounded with baseline history of dementia.  Appears to be moving all extremities however.  No facial droop noted.     ED Results / Procedures / Treatments   Labs (all labs ordered are listed, but only abnormal results are displayed) Labs Reviewed  COMPREHENSIVE METABOLIC PANEL - Abnormal; Notable for the following components:      Result Value   Glucose, Bld 122 (*)    Albumin 3.2 (*)    Anion gap 3 (*)    All other components within normal limits  CBC - Abnormal; Notable for the following components:   WBC 3.8 (*)    All other components within normal limits  URINALYSIS, ROUTINE W REFLEX MICROSCOPIC  CBG MONITORING, ED    EKG None  Radiology CT ABDOMEN PELVIS W CONTRAST  Result Date: 11/18/2021 CLINICAL DATA:  Abdominal pain EXAM: CT ABDOMEN AND PELVIS WITH CONTRAST TECHNIQUE: Multidetector CT imaging of the abdomen and pelvis was performed using the standard protocol following bolus administration of intravenous contrast. RADIATION DOSE REDUCTION: This exam was performed according to the departmental dose-optimization program which includes automated exposure control, adjustment of the mA and/or kV according to patient size and/or use of iterative reconstruction technique. CONTRAST:  165m OMNIPAQUE IOHEXOL 300 MG/ML  SOLN COMPARISON:  01/21/2021 FINDINGS: Lower chest: Transverse diameter of heart is increased. Coronary artery calcifications are seen. Pacer leads are noted. There is dilation of left ventricular cavity. There are linear densities in the posterior lower lung fields suggesting subsegmental atelectasis/pneumonitis. Breathing motion limits evaluation of lower lung fields. There is no pleural effusion. Hepatobiliary: No focal abnormality is seen in the liver. There is no dilation of bile ducts. Gallbladder is  distended. Pancreas: There is dilation of pancreatic duct. No focal abnormality is seen. Spleen: Unremarkable. Adrenals/Urinary Tract: There is 1.9 x 1.1 cm nodule in the medial limb of left adrenal which appears stable in comparison with studies dating as far back as 11/11/2020. Density measurements are more than 100 Hounsfield units. Follow-up multiphasic CT in 1 year may be considered. There is no hydronephrosis. There are multiple calcifications in both kidneys, possibly in the renal artery branches. There is no hydronephrosis. Ureters are not dilated. There is suprapubic cystostomy catheter in place. There is wall thickening in the bladder. There is mucosal enhancement in the bladder. Stomach/Bowel: Stomach is not distended. Small bowel loops are not dilated. Appendix is not distinctly seen. There is no significant wall thickening in colon. Moderate to large amount of  stool is seen in the colon and rectum. Vascular/Lymphatic: Extensive atherosclerotic changes are noted in the aorta and its major branches. Reproductive: There is marked enlargement of prostate projecting into the base of the bladder. Other: There is no or pneumoperitoneum. There is edema in the subcutaneous plane suggesting anasarca. Musculoskeletal: Degenerative changes are noted in the lumbar spine more so at L4-L5 level. IMPRESSION: There is no evidence of intestinal obstruction or pneumoperitoneum. There is no hydronephrosis. There is wall thickening in the urinary bladder along with mucosal enhancement suggesting possible cystitis. There is marked enlargement of prostate projecting into the bladder. There is 1.9 cm enhancing nodule in the left adrenal which has not changed significantly since 11/11/2020. Follow-up CT in 12 months may be considered. Cardiomegaly. Linear density in the lower lung fields may suggest subsegmental atelectasis. Other findings as described in the body of the report. Electronically Signed   By: Elmer Picker  M.D.   On: 11/18/2021 20:40   CT Head Wo Contrast  Result Date: 11/18/2021 CLINICAL DATA:  Altered mental status. EXAM: CT HEAD WITHOUT CONTRAST TECHNIQUE: Contiguous axial images were obtained from the base of the skull through the vertex without intravenous contrast. RADIATION DOSE REDUCTION: This exam was performed according to the departmental dose-optimization program which includes automated exposure control, adjustment of the mA and/or kV according to patient size and/or use of iterative reconstruction technique. COMPARISON:  August 11, 2021. FINDINGS: Brain: Mild diffuse cortical atrophy is noted. Mild chronic ischemic white matter disease is noted. No mass effect or midline shift is noted. Ventricular size is within normal limits. There is no evidence of mass lesion, hemorrhage or acute infarction. Vascular: No hyperdense vessel or unexpected calcification. Skull: Normal. Negative for fracture or focal lesion. Sinuses/Orbits: No acute finding. Other: None. IMPRESSION: No acute intracranial abnormality seen. Electronically Signed   By: Marijo Conception M.D.   On: 11/18/2021 11:15    Procedures Procedures    Medications Ordered in ED Medications  morphine (PF) 2 MG/ML injection 2 mg (2 mg Intravenous Given 11/18/21 1806)  ondansetron (ZOFRAN) injection 4 mg (4 mg Intravenous Given 11/18/21 1806)  sodium chloride 0.9 % bolus 500 mL (500 mLs Intravenous New Bag/Given 11/18/21 1806)  iohexol (OMNIPAQUE) 300 MG/ML solution 100 mL (100 mLs Intravenous Contrast Given 11/18/21 2017)    ED Course/ Medical Decision Making/ A&P                           Medical Decision Making Amount and/or Complexity of Data Reviewed Radiology: ordered.  Risk Prescription drug management.   Cardiac monitoring showing sinus rhythm.  Review of records shows recent discharge Oct 19, 2021 for respiratory failure and possible urinary tract infection.  When asked what hurts him or bothers him patient points to his  buttock region.  Labs are sent white count otherwise normal hemoglobin normal chemistry unremarkable.  Given 2 mg of morphine and IV fluid resuscitation.  CT imaging of the brain shows no acute findings per radiology.  CT abdomen pelvis pursued given chief complaint.  No acute findings noted in chronic appearing cystitis noted.  However blood tests are normal vital signs are normal patient is otherwise afebrile.  We will recommend outpatient follow-up with urology in 2 or 3 days.  Recommending immediate return for worsening symptoms.  Findings and case discussed with patient's daughter was on their way to pick the patient up.        Final Clinical Impression(s) /  ED Diagnoses Final diagnoses:  Abdominal pain, unspecified abdominal location  Diarrhea, unspecified type    Rx / DC Orders ED Discharge Orders     None         Luna Fuse, MD 11/18/21 2112

## 2021-11-18 NOTE — Discharge Instructions (Signed)
Call your primary care doctor or specialist as discussed in the next 2-3 days.   Return immediately back to the ER if:  Your symptoms worsen within the next 12-24 hours. You develop new symptoms such as new fevers, persistent vomiting, new pain, shortness of breath, or new weakness or numbness, or if you have any other concerns.  

## 2021-11-20 ENCOUNTER — Other Ambulatory Visit: Payer: Self-pay | Admitting: Family Medicine

## 2021-11-23 ENCOUNTER — Other Ambulatory Visit: Payer: Self-pay | Admitting: Family Medicine

## 2021-11-23 NOTE — Telephone Encounter (Signed)
Requested medication (s) are due for refill today - yes  Requested medication (s) are on the active medication list -yes  Future visit scheduled -yes  Last refill: 10/21/21 #60   Notes to clinic: non delegated Rx  Requested Prescriptions  Pending Prescriptions Disp Refills   QUEtiapine (SEROQUEL) 50 MG tablet [Pharmacy Med Name: QUETIAPINE '50MG'$   TABLETS] 60 tablet 0    Sig: TAKE 1 TABLET(50 MG) BY MOUTH TWICE DAILY     Not Delegated - Psychiatry:  Antipsychotics - Second Generation (Atypical) - quetiapine Failed - 11/23/2021  3:09 AM      Failed - This refill cannot be delegated      Failed - Last BP in normal range    BP Readings from Last 1 Encounters:  11/18/21 (!) 142/73         Failed - Lipid Panel in normal range within the last 12 months    Cholesterol  Date Value Ref Range Status  03/27/2021 111 0 - 200 mg/dL Final   LDL Cholesterol  Date Value Ref Range Status  03/27/2021 49 0 - 99 mg/dL Final    Comment:           Total Cholesterol/HDL:CHD Risk Coronary Heart Disease Risk Table                     Men   Women  1/2 Average Risk   3.4   3.3  Average Risk       5.0   4.4  2 X Average Risk   9.6   7.1  3 X Average Risk  23.4   11.0        Use the calculated Patient Ratio above and the CHD Risk Table to determine the patient's CHD Risk.        ATP III CLASSIFICATION (LDL):  <100     mg/dL   Optimal  100-129  mg/dL   Near or Above                    Optimal  130-159  mg/dL   Borderline  160-189  mg/dL   High  >190     mg/dL   Very High Performed at [facility] [address] [place], [place] [address]    HDL  Date Value Ref Range Status  03/27/2021 52 >40 mg/dL Final   Triglycerides  Date Value Ref Range Status  03/27/2021 52 <150 mg/dL Final         Failed - CMP within normal limits and completed in the last 12 months    Albumin  Date Value Ref Range Status  11/18/2021 3.2 (L) 3.5 - 5.0 g/dL Final   Alkaline Phosphatase  Date Value  Ref Range Status  11/18/2021 126 38 - 126 U/L Final   ALT  Date Value Ref Range Status  11/18/2021 12 0 - 44 U/L Final  07/10/2021 10 0 - 44 U/L Final   AST  Date Value Ref Range Status  11/18/2021 17 15 - 41 U/L Final  07/10/2021 12 (L) 15 - 41 U/L Final   BUN  Date Value Ref Range Status  11/18/2021 15 8 - 23 mg/dL Final   Calcium  Date Value Ref Range Status  11/18/2021 9.8 8.9 - 10.3 mg/dL Final   Calcium, Ion  Date Value Ref Range Status  06/21/2021 1.27 1.15 - 1.40 mmol/L Final   CO2  Date Value Ref Range Status  11/18/2021 27 22 -  32 mmol/L Final   Bicarbonate  Date Value Ref Range Status  10/12/2021 21.2 20.0 - 28.0 mmol/L Final   TCO2  Date Value Ref Range Status  06/21/2021 30 22 - 32 mmol/L Final   Creatinine  Date Value Ref Range Status  07/10/2021 0.87 0.61 - 1.24 mg/dL Final   Creatinine, Ser  Date Value Ref Range Status  11/18/2021 0.76 0.61 - 1.24 mg/dL Final   Glucose, Bld  Date Value Ref Range Status  11/18/2021 122 (H) 70 - 99 mg/dL Final    Comment:    Glucose reference range applies only to samples taken after fasting for at least 8 hours.   Glucose-Capillary  Date Value Ref Range Status  05/12/2021 106 (H) 70 - 99 mg/dL Final    Comment:    Glucose reference range applies only to samples taken after fasting for at least 8 hours.   Potassium  Date Value Ref Range Status  11/18/2021 3.9 3.5 - 5.1 mmol/L Final   Sodium  Date Value Ref Range Status  11/18/2021 141 135 - 145 mmol/L Final   Total Bilirubin  Date Value Ref Range Status  11/18/2021 0.7 0.3 - 1.2 mg/dL Final  07/10/2021 0.4 0.3 - 1.2 mg/dL Final   Bilirubin, Direct  Date Value Ref Range Status  11/11/2020 0.1 0.0 - 0.2 mg/dL Final   Indirect Bilirubin  Date Value Ref Range Status  11/11/2020 0.2 (L) 0.3 - 0.9 mg/dL Final    Comment:    Performed at [facility], [place] [address] [place], [place] [address]   Protein, ur  Date Value Ref Range Status   10/12/2021 100 (A) NEGATIVE mg/dL Final   Total Protein  Date Value Ref Range Status  11/18/2021 6.7 6.5 - 8.1 g/dL Final   GFR, Estimated  Date Value Ref Range Status  11/18/2021 >60 >60 mL/min Final    Comment:    (NOTE) Calculated using the CKD-EPI Creatinine Equation (2021)   07/10/2021 >60 >60 mL/min Final    Comment:    (NOTE) Calculated using the CKD-EPI Creatinine Equation (2021)          Passed - TSH in normal range and within 360 days    TSH  Date Value Ref Range Status  05/11/2021 0.740 0.350 - 4.500 uIU/mL Final    Comment:    Performed by a 3rd Generation assay with a functional sensitivity of <=0.01 uIU/mL. Performed at [facility], [place] [address] [place], [place] [address]          Passed - Last Heart Rate in normal range    Pulse Readings from Last 1 Encounters:  11/18/21 75         Passed - Valid encounter within last 6 months    Recent Outpatient Visits           1 month ago Hospital discharge follow-up   [place], Vernia Buff, NP   4 months ago Encounter to establish care   [place], Zelda W, NP       Future Appointments             In 2 months [name], NP [place] within normal limits and completed in the last 12 months    WBC  Date Value Ref Range Status  11/18/2021 3.8 (L)  4.0 - 10.5 K/uL Final   RBC  Date Value Ref Range Status  11/18/2021 4.81 4.22 - 5.81 MIL/uL Final   Hemoglobin  Date Value Ref Range Status  11/18/2021 13.7 13.0 - 17.0 g/dL Final  07/10/2021 12.0 (L) 13.0 - 17.0 g/dL Final   HCT  Date Value Ref Range Status  11/18/2021 44.0 39.0 - 52.0 % Final   MCHC  Date Value Ref Range Status  11/18/2021 31.1 30.0 - 36.0 g/dL Final   [facility]  Date Value Ref Range Status  11/18/2021 28.5 26.0 - 34.0 pg Final   MCV  Date Value Ref Range Status  11/18/2021 91.5  80.0 - 100.0 fL Final   No results found for: "PLTCOUNTKUC", "LABPLAT", "POCPLA" RDW  Date Value Ref Range Status  11/18/2021 15.2 11.5 - 15.5 % Final            Requested Prescriptions  Pending Prescriptions Disp Refills   QUEtiapine (SEROQUEL) 50 MG tablet [Pharmacy Med Name: QUETIAPINE '50MG'$   TABLETS] 60 tablet 0    Sig: TAKE 1 TABLET(50 MG) BY MOUTH TWICE DAILY     Not Delegated - Psychiatry:  Antipsychotics - Second Generation (Atypical) - quetiapine Failed - 11/23/2021  3:09 AM      Failed - This refill cannot be delegated      Failed - Last BP in normal range    BP Readings from Last 1 Encounters:  11/18/21 (!) 142/73         Failed - Lipid Panel in normal range within the last 12 months    Cholesterol  Date Value Ref Range Status  03/27/2021 111 0 - 200 mg/dL Final   LDL Cholesterol  Date Value Ref Range Status  03/27/2021 49 0 - 99 mg/dL Final    Comment:           Total Cholesterol/HDL:CHD Risk Coronary Heart Disease Risk Table                     Men   Women  1/2 Average Risk   3.4   3.3  Average Risk       5.0   4.4  2 X Average Risk   9.6   7.1  3 X Average Risk  23.4   11.0        Use the calculated Patient Ratio above and the CHD Risk Table to determine the patient's CHD Risk.        ATP III CLASSIFICATION (LDL):  <100     mg/dL   Optimal  100-129  mg/dL   Near or Above                    Optimal  130-159  mg/dL   Borderline  160-189  mg/dL   High  >190     mg/dL   Very High Performed at [facility] [address] [place], [place] [address]    HDL  Date Value Ref Range Status  03/27/2021 52 >40 mg/dL Final   Triglycerides  Date Value Ref Range Status  03/27/2021 52 <150 mg/dL Final         Failed - CMP within normal limits and completed in the last 12 months    Albumin  Date Value Ref Range Status  11/18/2021 3.2 (L) 3.5 - 5.0 g/dL Final   Alkaline Phosphatase  Date Value Ref Range Status  11/18/2021 126 38 - 126 U/L  Final   ALT  Date  Value Ref Range Status  11/18/2021 12 0 - 44 U/L Final  07/10/2021 10 0 - 44 U/L Final   AST  Date Value Ref Range Status  11/18/2021 17 15 - 41 U/L Final  07/10/2021 12 (L) 15 - 41 U/L Final   BUN  Date Value Ref Range Status  11/18/2021 15 8 - 23 mg/dL Final   Calcium  Date Value Ref Range Status  11/18/2021 9.8 8.9 - 10.3 mg/dL Final   Calcium, Ion  Date Value Ref Range Status  06/21/2021 1.27 1.15 - 1.40 mmol/L Final   CO2  Date Value Ref Range Status  11/18/2021 27 22 - 32 mmol/L Final   Bicarbonate  Date Value Ref Range Status  10/12/2021 21.2 20.0 - 28.0 mmol/L Final   TCO2  Date Value Ref Range Status  06/21/2021 30 22 - 32 mmol/L Final   Creatinine  Date Value Ref Range Status  07/10/2021 0.87 0.61 - 1.24 mg/dL Final   Creatinine, Ser  Date Value Ref Range Status  11/18/2021 0.76 0.61 - 1.24 mg/dL Final   Glucose, Bld  Date Value Ref Range Status  11/18/2021 122 (H) 70 - 99 mg/dL Final    Comment:    Glucose reference range applies only to samples taken after fasting for at least 8 hours.   Glucose-Capillary  Date Value Ref Range Status  05/12/2021 106 (H) 70 - 99 mg/dL Final    Comment:    Glucose reference range applies only to samples taken after fasting for at least 8 hours.   Potassium  Date Value Ref Range Status  11/18/2021 3.9 3.5 - 5.1 mmol/L Final   Sodium  Date Value Ref Range Status  11/18/2021 141 135 - 145 mmol/L Final   Total Bilirubin  Date Value Ref Range Status  11/18/2021 0.7 0.3 - 1.2 mg/dL Final  07/10/2021 0.4 0.3 - 1.2 mg/dL Final   Bilirubin, Direct  Date Value Ref Range Status  11/11/2020 0.1 0.0 - 0.2 mg/dL Final   Indirect Bilirubin  Date Value Ref Range Status  11/11/2020 0.2 (L) 0.3 - 0.9 mg/dL Final    Comment:    Performed at [facility], [place] [address], [place], [place] [address]   Protein, ur  Date Value Ref Range Status  10/12/2021 100 (A) NEGATIVE mg/dL Final   Total  Protein  Date Value Ref Range Status  11/18/2021 6.7 6.5 - 8.1 g/dL Final   GFR, Estimated  Date Value Ref Range Status  11/18/2021 >60 >60 mL/min Final    Comment:    (NOTE) Calculated using the CKD-EPI Creatinine Equation (2021)   07/10/2021 >60 >60 mL/min Final    Comment:    (NOTE) Calculated using the CKD-EPI Creatinine Equation (2021)          Passed - TSH in normal range and within 360 days    TSH  Date Value Ref Range Status  05/11/2021 0.740 0.350 - 4.500 uIU/mL Final    Comment:    Performed by a 3rd Generation assay with a functional sensitivity of <=0.01 uIU/mL. Performed at [facility], [place] [address] [place], [place] [address]          Passed - Last Heart Rate in normal range    Pulse Readings from Last 1 Encounters:  11/18/21 75         Passed - Valid encounter within last 6 months    Recent Outpatient Visits  1 month ago Hospital discharge follow-up   [place] [name], NP   4 months ago Encounter to establish care   [place], Vernia Buff, NP       Future Appointments             In 2 months [name], NP [place] within normal limits and completed in the last 12 months    WBC  Date Value Ref Range Status  11/18/2021 3.8 (L) 4.0 - 10.5 K/uL Final   RBC  Date Value Ref Range Status  11/18/2021 4.81 4.22 - 5.81 MIL/uL Final   Hemoglobin  Date Value Ref Range Status  11/18/2021 13.7 13.0 - 17.0 g/dL Final  07/10/2021 12.0 (L) 13.0 - 17.0 g/dL Final   HCT  Date Value Ref Range Status  11/18/2021 44.0 39.0 - 52.0 % Final   MCHC  Date Value Ref Range Status  11/18/2021 31.1 30.0 - 36.0 g/dL Final   [facility]  Date Value Ref Range Status  11/18/2021 28.5 26.0 - 34.0 pg Final   MCV  Date Value Ref Range Status  11/18/2021 91.5 80.0 - 100.0 fL Final   No results found for:  "PLTCOUNTKUC", "LABPLAT", "POCPLA" RDW  Date Value Ref Range Status  11/18/2021 15.2 11.5 - 15.5 % Final

## 2021-11-26 ENCOUNTER — Telehealth: Payer: Self-pay | Admitting: Nurse Practitioner

## 2021-11-26 NOTE — Telephone Encounter (Signed)
Copied from [place] [id number]. Topic: Quick Communication - Home Health Verbal Orders >> Nov 26, 2021 10:46 AM Rudene Anda wrote: Caller/Agency: [facility] Callback Number: [phone] Requesting" PT evaluation

## 2021-11-26 NOTE — Telephone Encounter (Signed)
Copied from [place] [id number]. Topic: General - Other >> Nov 26, 2021  9:41 AM Rudene Anda wrote: Reason for CRM: Verdene Lennert from [facility] [phone]) requested a call back to discuss home health orders from May 2023.

## 2021-11-27 NOTE — Telephone Encounter (Signed)
Per Elnita Maxwell with [facility]- They were backed up and asked another company to take on the patient and the other company did not.   Verbal order for home health nursing and PT  Verbal order given-  Frequency-  PT- evaluation and treat Nursing - disease and medication management

## 2021-11-28 ENCOUNTER — Emergency Department (HOSPITAL_COMMUNITY): Payer: Medicare Other

## 2021-11-28 ENCOUNTER — Other Ambulatory Visit: Payer: Self-pay

## 2021-11-28 ENCOUNTER — Emergency Department (HOSPITAL_COMMUNITY)
Admission: EM | Admit: 2021-11-28 | Discharge: 2021-11-29 | Disposition: A | Discharge status: Home / Self Care | Payer: Medicare Other | Attending: Emergency Medicine | Admitting: Emergency Medicine

## 2021-11-28 ENCOUNTER — Encounter (HOSPITAL_COMMUNITY): Payer: Self-pay

## 2021-11-28 DIAGNOSIS — R079 Chest pain, unspecified: Secondary | ICD-10-CM

## 2021-11-28 DIAGNOSIS — R778 Other specified abnormalities of plasma proteins: Secondary | ICD-10-CM | POA: Insufficient documentation

## 2021-11-28 DIAGNOSIS — D649 Anemia, unspecified: Secondary | ICD-10-CM | POA: Diagnosis not present

## 2021-11-28 DIAGNOSIS — I10 Essential (primary) hypertension: Secondary | ICD-10-CM | POA: Insufficient documentation

## 2021-11-28 DIAGNOSIS — T83090A Other mechanical complication of cystostomy catheter, initial encounter: Secondary | ICD-10-CM

## 2021-11-28 DIAGNOSIS — T83098A Other mechanical complication of other indwelling urethral catheter, initial encounter: Secondary | ICD-10-CM | POA: Insufficient documentation

## 2021-11-28 DIAGNOSIS — I2510 Atherosclerotic heart disease of native coronary artery without angina pectoris: Secondary | ICD-10-CM | POA: Diagnosis not present

## 2021-11-28 DIAGNOSIS — Z7901 Long term (current) use of anticoagulants: Secondary | ICD-10-CM | POA: Diagnosis not present

## 2021-11-28 LAB — BASIC METABOLIC PANEL
Anion gap: 6 (ref 5–15)
BUN: 19 mg/dL (ref 8–23)
CO2: 25 mmol/L (ref 22–32)
Calcium: 9.5 mg/dL (ref 8.9–10.3)
Chloride: 108 mmol/L (ref 98–111)
Creatinine, Ser: 0.68 mg/dL (ref 0.61–1.24)
GFR, Estimated: >60 mL/min (ref >60)
Glucose, Bld: 132 mg/dL — ABNORMAL HIGH (ref 70–99)
Potassium: 3.5 mmol/L (ref 3.5–5.1)
Sodium: 139 mmol/L (ref 135–145)

## 2021-11-28 LAB — CBC
HCT: 37.2 % — ABNORMAL LOW (ref 39.0–52.0)
Hemoglobin: 11.6 g/dL — ABNORMAL LOW (ref 13.0–17.0)
MCH: 28.1 pg (ref 26.0–34.0)
MCHC: 31.2 g/dL (ref 30.0–36.0)
MCV: 90.1 fL (ref 80.0–100.0)
Platelets: 187 10*3/uL (ref 150–400)
RBC: 4.13 MIL/uL — ABNORMAL LOW (ref 4.22–5.81)
RDW: 15.5 % (ref 11.5–15.5)
WBC: 5.3 10*3/uL (ref 4.0–10.5)
nRBC: 0 % (ref 0.0–0.2)

## 2021-11-28 LAB — TROPONIN I (HIGH SENSITIVITY): Troponin I (High Sensitivity): 31 ng/L — ABNORMAL HIGH (ref <18)

## 2021-11-28 NOTE — ED Provider Triage Note (Addendum)
  Emergency Medicine Provider Triage Evaluation Note  MRN:  [id number]  Arrival date & time: 11/28/21    Medically screening exam initiated at 10:57 PM.   CC:   Chest pain, blocked suprapubic  HPI:  Todd Dunn is a 81 y.o. year-old male presents to the ED with chief complaint of blocked suprapubic catheter.  Also complaining that his AICD is hurting him. Difficult historian.  History provided by History provided by patient. ROS:  -As included in HPI PE:   Vitals:   11/28/21 2258  BP: (!) 138/91  Pulse: 87  Resp: 18  Temp: 98.3 F (36.8 C)  SpO2: 100%    Non-toxic appearing No respiratory distress  MDM:  Based on signs and symptoms, clogged foley is highest on my differential. I've ordered labs in triage to expedite lab/diagnostic workup.  Patient was informed that the remainder of the evaluation will be completed by another provider, this initial triage assessment does not replace that evaluation, and the importance of remaining in the ED until their evaluation is complete.    Montine Circle, PA-C 11/28/21 2259    Montine Circle, PA-C 11/28/21 2340

## 2021-11-29 LAB — TROPONIN I (HIGH SENSITIVITY): Troponin I (High Sensitivity): 34 ng/L — ABNORMAL HIGH (ref <18)

## 2021-12-06 ENCOUNTER — Other Ambulatory Visit: Payer: Self-pay | Admitting: Nurse Practitioner

## 2021-12-06 ENCOUNTER — Other Ambulatory Visit: Payer: Self-pay | Admitting: Family Medicine

## 2021-12-06 MED ORDER — DOCUSATE SODIUM 250 MG PO CAPS: 250.0000 mg | ORAL_CAPSULE | Freq: Every day | ORAL | 0 refills | Status: DC

## 2021-12-06 MED ORDER — ASCORBIC ACID 500 MG PO TABS
500.0000 mg | ORAL_TABLET | Freq: Two times a day (BID) | ORAL | 0 refills | Status: DC
Start: 2021-12-06 — End: 2022-02-17

## 2021-12-06 MED ORDER — OXYBUTYNIN CHLORIDE ER 5 MG PO TB24: 5.0000 mg | ORAL_TABLET | Freq: Every day | ORAL | 0 refills | Status: DC

## 2021-12-06 MED ORDER — QUETIAPINE FUMARATE 50 MG PO TABS: 50.0000 mg | ORAL_TABLET | Freq: Every day | ORAL | 1 refills | Status: AC

## 2021-12-06 MED ORDER — QUETIAPINE FUMARATE 50 MG PO TABS: 50.0000 mg | ORAL_TABLET | Freq: Every day | ORAL | 3 refills | Status: DC

## 2021-12-06 MED ORDER — SENNOSIDES-DOCUSATE SODIUM 8.6-50 MG PO TABS: 2.0000 | ORAL_TABLET | Freq: Two times a day (BID) | ORAL | 0 refills | Status: DC

## 2021-12-11 ENCOUNTER — Other Ambulatory Visit: Payer: Self-pay | Admitting: Family Medicine

## 2021-12-11 NOTE — Telephone Encounter (Signed)
Refilled 12/06/2021 #60 - receipt confirmed by pharmacy. Requested Prescriptions  Pending Prescriptions Disp Refills  . senna-docusate (SENOKOT-S) 8.6-50 MG tablet [Pharmacy Med Name: SENNA S 8.6-'50MG'$  TABLETS] 60 tablet 0    Sig: TAKE 2 TABLETS BY MOUTH TWICE DAILY     Over the Counter:  OTC Passed - 12/11/2021  8:00 AM      Passed - Valid encounter within last 12 months    Recent Outpatient Visits          1 month ago Hospital discharge follow-up   [place], Vernia Buff, NP   4 months ago Encounter to establish care   [place], Vernia Buff, NP      Future Appointments            In 1 month [name], NP [place]

## 2021-12-14 ENCOUNTER — Ambulatory Visit: Payer: Medicare Other | Admitting: Internal Medicine

## 2021-12-16 NOTE — Progress Notes (Unsigned)
Cardiology Office Note:    Date:  12/17/2021   ID:  Todd Dunn, DOB Dec 25, 1940, MRN [id number]  PCP:  [name], NP   [facility] Providers Cardiologist:  Lenna Sciara, MD Referring MD: [name], NP   Chief Complaint/Reason for Referral: Systolic heart failure  ASSESSMENT:    1. Ischemic cardiomyopathy   2. 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 Primary hypertension   4. Hyperlipidemia, unspecified hyperlipidemia type   5. SSS s/p PPM   6. PVD (peripheral vascular disease) ([place])   7. Aortic atherosclerosis ([place])   8. ICD (implantable cardioverter-defibrillator) in place     PLAN:    In order of problems listed above: 1.  Ischemic cardiomyopathy: The patient is euvolemic on exam.  Given his chronic urinary tract infections I do not think Jardiance or Wilder Glade is a good idea.  I had a long conversation with the patient's family regarding CODE STATUS given his multiple comorbidities and dementia.  I will refer him for outpatient palliative care to discuss goals of care.  Grandchildren seem amenable to converting to being DNR in the future.  I will see him back in 9 months or earlier if needed. 2.  Coronary artery disease status post CABG: Continue Plavix for now.  He is intolerant of statins 3.  Hypertension: Blood pressures fairly well controlled on his current regimen. 4.  Hyperlipidemia: He is intolerant of statins. 5.  Sick sinus syndrome status post permanent pacemaker: 6.  Peripheral vascular disease: Status post right AKA 7.  Aortic atherosclerosis: Continue Plavix in lieu of aspirin; he is intolerant of statins. 8.  Status post ICD: I will have him see EP to establish device care but again he did discuss with the patient's family the possibility of deactivating his ICD in the future depending on how discussions go.            Dispo:  Return in about 9 months (around 09/18/2022).       Medication Adjustments/Labs and Tests Ordered: Current medicines are reviewed at length with the patient today.  Concerns regarding medicines are outlined above.  The following changes have been made:  no change   Labs/tests ordered: Orders Placed This Encounter  Procedures   Ambulatory referral to Cardiac Electrophysiology    Medication Changes: No orders of the defined types were placed in this encounter.    Current medicines are reviewed at length with the patient today.  The patient does not have concerns regarding medicines.   History of Present Illness:    FOCUSED PROBLEM LIST:   1.  Coronary artery disease status post stenting of PLV and circumflex remotely CABG remotely; unknown anatomy 2.  Ischemic cardiomyopathy with ejection fraction of 20 to 25% with moderate mitral regurgitation; s/p ICD 3.  Sick sinus syndrome status post permanent pacemaker 4.  Hypertension 5.  Hyperlipidemia with statin intolerance 6.  Peripheral vascular status complicated by osteomyelitis post right AKA 7.  Dementia 8.  Recurrent UTIs with chronic Foley catheter 9.  History of stroke with expressive aphasia 10.  Followed by palliative care 11.  CT chest January 2023 with aortic atherosclerosis  The patient is a 81 y.o. male with the indicated medical history here for recommendations regarding his severe cardiomyopathy.  He was seen by his primary care provider in May 2023.  Looks like he was without significant cardiovascular complaints at that time.  Echocardiogram was obtained in the interim  which demonstrated severe cardiomyopathy with moderate mitral regurgitation.    The patient is here with his grandchildren.  I was able to speak with another granddaughter through the phone.  The patient is alert and oriented x1.  He does not appear dyspneic.  He is unable to really answer any my questions.  His grandchildren tell me that he occasionally gets short of breath but his albuterol will help.   He is mainly symptomatic from chronic urinary tract infections.  He has not required any hospitalizations or emergency room visits for breathing issues.        Current Medications: Current Meds  Medication Sig   albuterol (VENTOLIN HFA) 108 (90 Base) MCG/ACT inhaler INHALE 2 PUFFS INTO THE LUNGS EVERY 6 HOURS AS NEEDED FOR WHEEZING OR SHORTNESS OF BREATH   ascorbic acid (VITAMIN C) 500 MG tablet Take 1 tablet (500 mg total) by mouth 2 (two) times daily.   clopidogrel (PLAVIX) 75 MG tablet Take 1 tablet (75 mg total) by mouth at bedtime.   feeding supplement (ENSURE ENLIVE / ENSURE PLUS) LIQD Take 237 mLs by mouth 3 (three) times daily between meals.   hydrALAZINE (APRESOLINE) 10 MG tablet Take 1 tablet (10 mg total) by mouth every 8 (eight) hours.   isosorbide mononitrate (IMDUR) 30 MG 24 hr tablet Take 0.5 tablets (15 mg total) by mouth daily.   nitroGLYCERIN (NITROSTAT) 0.4 MG SL tablet Place 0.4 mg under the tongue every 5 (five) minutes x 3 doses as needed for chest pain.   oxybutynin (DITROPAN-XL) 5 MG 24 hr tablet Take 1 tablet (5 mg total) by mouth daily.   pantoprazole (PROTONIX) 40 MG tablet TAKE 1 TABLET(40 MG) BY MOUTH DAILY   polyethylene glycol (MIRALAX / GLYCOLAX) 17 g packet Take 17 g by mouth daily as needed for mild constipation.   QUEtiapine (SEROQUEL) 50 MG tablet Take 1 tablet (50 mg total) by mouth at bedtime.   senna-docusate (SENOKOT-S) 8.6-50 MG tablet Take 2 tablets by mouth 2 (two) times daily.   vitamin B-12 (CYANOCOBALAMIN) 1000 MCG tablet Take 1 tablet (1,000 mcg total) by mouth daily.   Vitamin D, Ergocalciferol, (DRISDOL) 1.25 MG (50000 UNIT) CAPS capsule Take 1 capsule (50,000 Units total) by mouth every Monday.   zinc sulfate 220 (50 Zn) MG capsule Take 1 capsule (220 mg total) by mouth daily.     Allergies:    Food, Aspirin, Crestor [rosuvastatin calcium], [place] qu[place]og clam (m. mercenaria) skin test, and Penicillins   Social History:   Social History    Tobacco Use   Smoking status: Former    Types: Cigarettes    Passive exposure: Never   Smokeless tobacco: Never  Substance Use Topics   Alcohol use: Not Currently   Drug use: Never     Family Hx: Family History  Family history unknown: Yes     Review of Systems:   Please see the history of present illness.    All other systems reviewed and are negative.     EKGs/Labs/Other Test Reviewed:    EKG:  EKG performed June 2023 that I personally reviewed demonstrates sinus rhythm with LVH and PVCs  Prior CV studies:  TTE 2023: Ejection fraction of 20 to 25% with global hypokinesis and moderate mitral regurgitation.  The LV is dilated internal systolic dimension of 6 cm  Other studies Reviewed: Review of the additional studies/records demonstrates: Chest CT 2023 with aortic atherosclerosis and emphysema  Recent Labs: 05/11/2021: TSH 0.740 10/12/2021: B Natriuretic Peptide 259.1  10/16/2021: Magnesium 2.2 11/18/2021: ALT 12 11/28/2021: BUN 19; Creatinine, Ser 0.68; Hemoglobin 11.6; Platelets 187; Potassium 3.5; Sodium 139   Recent Lipid Panel Lab Results  Component Value Date/Time   CHOL 111 03/27/2021 12:38 AM   TRIG 52 03/27/2021 12:38 AM   HDL 52 03/27/2021 12:38 AM   LDLCALC 49 03/27/2021 12:38 AM    Risk Assessment/Calculations:           Physical Exam:    VS:  BP 130/70   Pulse 63   Ht 6' (1.829 m)   SpO2 98%   BMI 14.92 kg/m    Wt Readings from Last 3 Encounters:  11/28/21 110 lb (49.9 kg)  11/18/21 110 lb 3.7 oz (50 kg)  10/19/21 108 lb 3.9 oz (49.1 kg)    GENERAL:  No apparent distress, AOx3 HEENT:  No carotid bruits, +2 carotid impulses, no scleral icterus mucous membranes CAR: RRR no murmurs, gallops, rubs, or thrills RES:  Clear to auscultation bilaterally ABD:  Soft, nontender, nondistended, positive bowel sounds x 4 VASC: Right AKA  NEURO:  CN 2-12 grossly intact; motor and sensory grossly intact PSYCH:  No active depression or anxiety EXT:   No edema, ecchymosis, or cyanosis; right AKA  Signed, Early Osmond, MD  12/17/2021 10:02 AM    [place], [place],   [address] Phone: [phone]; Fax: [phone]   Note:  This document was prepared using Dragon voice recognition software and may include unintentional dictation errors.

## 2021-12-17 ENCOUNTER — Ambulatory Visit (INDEPENDENT_AMBULATORY_CARE_PROVIDER_SITE_OTHER): Payer: Medicare Other | Admitting: Internal Medicine

## 2021-12-17 ENCOUNTER — Encounter: Payer: Self-pay | Admitting: Internal Medicine

## 2021-12-17 VITALS — BP 130/70 | HR 63 | Ht 72.0 in

## 2021-12-17 DIAGNOSIS — I255 Ischemic cardiomyopathy: Secondary | ICD-10-CM | POA: Diagnosis not present

## 2021-12-17 DIAGNOSIS — E785 Hyperlipidemia, unspecified: Secondary | ICD-10-CM | POA: Diagnosis not present

## 2021-12-17 DIAGNOSIS — I25810 Atherosclerosis of coronary artery bypass graft(s) without angina pectoris: Secondary | ICD-10-CM | POA: Diagnosis not present

## 2021-12-17 DIAGNOSIS — I495 Sick sinus syndrome: Secondary | ICD-10-CM

## 2021-12-17 DIAGNOSIS — I10 Essential (primary) hypertension: Secondary | ICD-10-CM | POA: Diagnosis not present

## 2021-12-17 DIAGNOSIS — I739 Peripheral vascular disease, unspecified: Secondary | ICD-10-CM

## 2021-12-17 DIAGNOSIS — I700 Atherosclerosis of aorta: Secondary | ICD-10-CM

## 2021-12-17 DIAGNOSIS — Z95810 Presence of automatic (implantable) cardiac defibrillator: Secondary | ICD-10-CM

## 2021-12-17 NOTE — Patient Instructions (Addendum)
Medication Instructions:  No changes *If you need a refill on your cardiac medications before your next appointment, please call your pharmacy*   Lab Work: none   Testing/Procedures: none   Follow-Up: At [facility], you and your health needs are our priority.  As part of our continuing mission to provide you with exceptional heart care, we have created designated Provider Care Teams.  These Care Teams include your primary Cardiologist (physician) and Advanced Practice Providers (APPs -  Physician Assistants and Nurse Practitioners) who all work together to provide you with the care you need, when you need it.  Your next appointment:   9 month(s)  The format for your next appointment:   In Person  Provider:   Early Osmond, MD     Other Instructions Dr. Ali Lowe recommends Palliative Care to discuss goals of care We will arrange an appointment with cardiac electrophysiology for ICD support

## 2021-12-30 ENCOUNTER — Other Ambulatory Visit: Payer: Self-pay | Admitting: Nurse Practitioner

## 2021-12-31 ENCOUNTER — Encounter: Payer: Self-pay | Admitting: Internal Medicine

## 2021-12-31 ENCOUNTER — Ambulatory Visit (INDEPENDENT_AMBULATORY_CARE_PROVIDER_SITE_OTHER): Payer: Medicare Other | Admitting: Internal Medicine

## 2021-12-31 DIAGNOSIS — Z95810 Presence of automatic (implantable) cardiac defibrillator: Secondary | ICD-10-CM | POA: Diagnosis not present

## 2021-12-31 NOTE — Progress Notes (Signed)
ELECTROPHYSIOLOGY CONSULT NOTE  Patient ID: Todd Dunn, MRN: [id number], DOB/AGE: [birth year] 81 y.o. Admit date: (Not on file) Date of Consult: 12/31/2021  Primary Physician: Todd Pounds, NP Primary Cardiologist: AT     [name] is a 81 y.o. male who is being seen today for the evaluation of ICD  at the request of DrAT.    HPI Todd Dunn is a 81 y.o. 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  Note from 5/22 reviewed.  At that time the patient was living in a nursing facility.  Prior stroke significant debility he is largely aphasic.  He is nonambulatory.  He underwent lower extremity revascularization which was not successful and ended up with a right BKA.  He is wheelchair-bound.   DATE TEST EF   2/20 Echo (CE)   30 %   5/21 Echo (CE)   40-45 %   10/22 Echo  30-35%   5/23 Echo  20-25%    Date Cr K Hgb  6/23 0.68 3.5 11.6<<13.7       Past Medical History:  Diagnosis Date   BPH (benign prostatic hyperplasia)    CAD (coronary artery disease)    Chronic indwelling Foley catheter    CVA (cerebral vascular accident) ([place])    GERD (gastroesophageal reflux disease)    HTN (hypertension)    Neurogenic bladder    SSS (sick sinus syndrome) ([place])    s/p MDT ICD   Vitamin D deficiency       Surgical History:  Past Surgical History:  Procedure Laterality Date   ABDOMINAL AORTOGRAM W/LOWER EXTREMITY N/A 03/23/2021   Procedure: ABDOMINAL AORTOGRAM W/LOWER EXTREMITY;  Surgeon: Waynetta Sandy, MD;  Location: [facility];  Service: Cardiovascular;  Laterality: N/A;   AMPUTATION Right 04/01/2021   Procedure: RIGHT GREAT TOE AMPUTATION;  Surgeon: Newt Minion, MD;   Location: [place];  Service: Orthopedics;  Laterality: Right;   AMPUTATION Right 07/30/2021   Procedure: AMPUTATION ABOVE KNEE;  Surgeon: Broadus John, MD;  Location: [place];  Service: Vascular;  Laterality: Right;   APPENDECTOMY     APPLICATION OF WOUND VAC Right 04/01/2021   Procedure: APPLICATION OF WOUND VAC;  Surgeon: Newt Minion, MD;  Location: [facility];  Service: Orthopedics;  Laterality: Right;   CARDIAC SURGERY     FEMORAL-TIBIAL BYPASS GRAFT Right 03/26/2021   Procedure: BYPASS GRAFT FEMORAL-TIBIAL ARTERY.  Harvest right leg saphenous vein, right vein patch angioplasty posterior tibial.;  Surgeon: Marty Heck, MD;  [facility];  Service: Vascular;  Laterality: Right;   IR US GUIDE BX ASP/DRAIN  08/03/2021   IR US GUIDE BX ASP/DRAIN  08/06/2021   PACEMAKER PLACEMENT       Home Meds: Current Meds  Medication Sig   albuterol (VENTOLIN HFA) 108 (90 Base) MCG/ACT inhaler INHALE 2 PUFFS INTO THE LUNGS EVERY 6 HOURS AS NEEDED FOR WHEEZING OR SHORTNESS OF BREATH   ascorbic acid (VITAMIN C) 500 MG tablet Take 1 tablet (500 mg total) by mouth 2 (two) times daily.   clopidogrel (PLAVIX) 75 MG tablet Take 1 tablet (75 mg total) by mouth at bedtime.   feeding supplement (  ENSURE ENLIVE / ENSURE PLUS) LIQD Take 237 mLs by mouth 3 (three) times daily between meals.   hydrALAZINE (APRESOLINE) 10 MG tablet Take 1 tablet (10 mg total) by mouth every 8 (eight) hours.   isosorbide mononitrate (IMDUR) 30 MG 24 hr tablet Take 0.5 tablets (15 mg total) by mouth daily.   nitroGLYCERIN (NITROSTAT) 0.4 MG SL tablet Place 0.4 mg under the tongue every 5 (five) minutes x 3 doses as needed for chest pain.   oxybutynin (DITROPAN-XL) 5 MG 24 hr tablet Take 1 tablet (5 mg total) by mouth daily.   pantoprazole (PROTONIX) 40 MG tablet TAKE 1 TABLET(40 MG) BY MOUTH DAILY   polyethylene glycol (MIRALAX / GLYCOLAX) 17 g packet Take 17 g by mouth daily as needed for mild constipation.   QUEtiapine (SEROQUEL)  50 MG tablet Take 1 tablet (50 mg total) by mouth at bedtime.   senna-docusate (SENOKOT-S) 8.6-50 MG tablet Take 2 tablets by mouth 2 (two) times daily.   vitamin B-12 (CYANOCOBALAMIN) 1000 MCG tablet Take 1 tablet (1,000 mcg total) by mouth daily.   Vitamin D, Ergocalciferol, (DRISDOL) 1.25 MG (50000 UNIT) CAPS capsule Take 1 capsule (50,000 Units total) by mouth every Monday.   zinc sulfate 220 (50 Zn) MG capsule Take 1 capsule (220 mg total) by mouth daily.    Allergies:  Allergies  Allergen Reactions   Food Anaphylaxis and Rash    Clams   Aspirin Itching   Crestor [Rosuvastatin Calcium] Other (See Comments)    Muscle pain   [place] Clam (M. Mercenaria) Skin Test Itching   Penicillins Itching    Social History   Socioeconomic History   Marital status: Widowed    Spouse name: Not on file   Number of children: Not on file   Years of education: Not on file   Highest education level: Not on file  Occupational History   Not on file  Tobacco Use   Smoking status: Former    Types: Cigarettes    Passive exposure: Never   Smokeless tobacco: Never  Substance and Sexual Activity   Alcohol use: Not Currently   Drug use: Never   Sexual activity: Not on file  Other Topics Concern   Not on file  Social History Narrative   ** Merged History Encounter **       Social Determinants of Health   Financial Resource Strain: Not on file  Food Insecurity: Not on file  Transportation Needs: Not on file  Physical Activity: Not on file  Stress: Not on file  Social Connections: Not on file  Intimate Partner Violence: Not on file     Family History  Family history unknown: Yes     ROS:  Please see the history of present illness.      All other systems reviewed and negative.    Physical Exam:  Blood pressure 138/74, pulse 73, height 6' (1.829 m), SpO2 99 %. General: Well developed, w cachectic male in no acute distress. Head: Normocephalic, atraumatic, sclera non-icteric,  no xanthomas, nares are without discharge. EENT: normal  Lymph Nodes:  none Back:with kyphosis  Lungs: Clear bilaterally to auscultation without wheezes, rales, or rhonchi. Breathing is unlabored. Heart: RRR with S1 S2. No   murmur . No rubs, or gallops appreciated. Abdomen: Soft,  . Msk: Uses his right arm Extremities: No clubbing or cyanosis. No edema.  Status post right BKA distal pedal pulses are 2+ and equal bilaterally. Skin: Warm and Dry Neuro: Alert a nonverbal  to the point of being unable to tell orientation.  He does seem to answer questions and make comments with some purpose.  CN III-XII intact Grossly normal sensory and motor function . Psych:         EKG: sinus a 73 with PVCs      Assessment and Plan:  Ischemic cardiomyopathy  Implantable defibrillator-Medtronic-dual-chamber question primary prevention  Stroke with left hemiparesis  Right BKA  Largely aphasic     Palliative care consultations have been set up.  At this juncture, we will continue monitoring his defibrillator and have arranged for home monitoring.  I have reviewed with his granddaughter, his primary caretaker, that the defibrillator can be inactivated at his/their request  Continue his current medications including isosorbide, hydralazine, Plavix.      Virl Axe

## 2021-12-31 NOTE — Patient Instructions (Signed)
Medication Instructions:  Your physician recommends that you continue on your current medications as directed. Please refer to the Current Medication list given to you today.  *If you need a refill on your cardiac medications before your next appointment, please call your pharmacy*   Lab Work: None ordered.  If you have labs (blood work) drawn today and your tests are completely normal, you will receive your results only by: [place] (if you have MyChart) OR A paper copy in the mail If you have any lab test that is abnormal or we need to change your treatment, we will call you to review the results.   Testing/Procedures: None ordered.    Follow-Up: At [facility], you and your health needs are our priority.  As part of our continuing mission to provide you with exceptional heart care, we have created designated Provider Care Teams.  These Care Teams include your primary Cardiologist (physician) and Advanced Practice Providers (APPs -  Physician Assistants and Nurse Practitioners) who all work together to provide you with the care you need, when you need it.  We recommend signing up for the patient portal called "MyChart".  Sign up information is provided on this After Visit Summary.  MyChart is used to connect with patients for Virtual Visits (Telemedicine).  Patients are able to view lab/test results, encounter notes, upcoming appointments, etc.  Non-urgent messages can be sent to your provider as well.   To learn more about what you can do with MyChart, go to [email or web address].    Your next appointment:   12 months with Dr Caryl Comes  We will set up home monitoring for your ICD  Important Information About Sugar

## 2022-01-07 ENCOUNTER — Encounter: Payer: Medicare Other | Admitting: Internal Medicine

## 2022-01-12 ENCOUNTER — Other Ambulatory Visit: Payer: Self-pay | Admitting: Nurse Practitioner

## 2022-01-12 ENCOUNTER — Other Ambulatory Visit: Payer: Self-pay | Admitting: Family Medicine

## 2022-01-12 ENCOUNTER — Telehealth: Payer: Self-pay | Admitting: Emergency Medicine

## 2022-01-12 NOTE — Telephone Encounter (Signed)
Copied from [place] [id number]. Topic: General - Other >> Jan 12, 2022  2:52 PM Ludger Nutting wrote: Lynelle Smoke with [facility] called to see if we received a request for a wheelchair order. Please advise.

## 2022-01-13 NOTE — Telephone Encounter (Signed)
Requested Prescriptions  Pending Prescriptions Disp Refills  . senna-docusate (SENOKOT-S) 8.6-50 MG tablet [Pharmacy Med Name: SENNA S 8.6-'50MG'$  TABLETS] 120 tablet 2    Sig: TAKE 2 TABLETS BY MOUTH TWICE DAILY     Over the Counter:  OTC Passed - 01/12/2022  5:55 PM      Passed - Valid encounter within last 12 months    Recent Outpatient Visits          2 months ago Hospital discharge follow-up   [place] [name], NP   6 months ago Encounter to establish care   [place], Vernia Buff, NP      Future Appointments            In 2 weeks [name], NP [place]

## 2022-01-13 NOTE — Telephone Encounter (Signed)
Refused Colace 250 mg tablet because it was discontinued 12/06/2021.     Can be purchased over the counter.

## 2022-01-14 ENCOUNTER — Emergency Department (HOSPITAL_COMMUNITY)
Admission: EM | Admit: 2022-01-14 | Discharge: 2022-01-15 | Disposition: A | Discharge status: Home / Self Care | Payer: Medicare Other | Attending: Emergency Medicine | Admitting: Emergency Medicine

## 2022-01-14 ENCOUNTER — Other Ambulatory Visit: Payer: Self-pay

## 2022-01-14 ENCOUNTER — Encounter (HOSPITAL_COMMUNITY): Payer: Self-pay | Admitting: Emergency Medicine

## 2022-01-14 DIAGNOSIS — Z87891 Personal history of nicotine dependence: Secondary | ICD-10-CM | POA: Diagnosis not present

## 2022-01-14 DIAGNOSIS — Z7901 Long term (current) use of anticoagulants: Secondary | ICD-10-CM | POA: Diagnosis not present

## 2022-01-14 DIAGNOSIS — I10 Essential (primary) hypertension: Secondary | ICD-10-CM | POA: Diagnosis not present

## 2022-01-14 DIAGNOSIS — T83011A Breakdown (mechanical) of indwelling urethral catheter, initial encounter: Secondary | ICD-10-CM | POA: Insufficient documentation

## 2022-01-14 DIAGNOSIS — I2510 Atherosclerotic heart disease of native coronary artery without angina pectoris: Secondary | ICD-10-CM | POA: Diagnosis not present

## 2022-01-14 MED ORDER — OXYCODONE-ACETAMINOPHEN 5-325 MG PO TABS: 1.0000 | ORAL_TABLET | Freq: Once | ORAL | Status: DC

## 2022-01-14 NOTE — ED Triage Notes (Signed)
Pt has indwelling foley  About 24 hours ago began to leak urine around his insertion site.   NAD. No pain, fever, chills, shob

## 2022-01-14 NOTE — ED Notes (Signed)
Patient called x2 with no response. Patient and patient family member not seen in the lobby at this time

## 2022-01-15 ENCOUNTER — Encounter (HOSPITAL_COMMUNITY): Payer: Self-pay | Admitting: Emergency Medicine

## 2022-01-15 NOTE — Discharge Instructions (Addendum)
It was a pleasure caring for you today in the emergency department.  Please return to the emergency department for any worsening or worrisome symptoms.  Please follow-up with the urology specialist regarding suprapubic catheter/Foley catheter

## 2022-01-15 NOTE — ED Notes (Signed)
Pt family refuses to keep patient here for vitals. DC papers given as pt family is wheeling pt out to lobby. Papers briefly reviewed. Pt brought to lobby by family in wheelchair.

## 2022-01-15 NOTE — ED Provider Notes (Signed)
[facility] EMERGENCY DEPARTMENT Provider Note   CSN: [id number] Arrival date & time: 01/14/22  1730     History  Chief Complaint  Patient presents with   Foley Problem    Todd Dunn is a 81 y.o. male.  Patient as above with significant medical history as below, including BPH, chronic suprapubic catheter, HTN, neurogenic bladder, CVA, R-AKA who presents to the ED with complaint of Foley abnormality.  Discussed with granddaughter at bedside, patient urinating around catheter site and not through his catheter.  No vomiting, fevers, change in bowel function, rashes.  Patient has no acute complaints at bedside but he is a poor historian.  He has been seen for this in the past, catheter has been replaced previously, 16 [place]    Past Medical History:  Diagnosis Date   BPH (benign prostatic hyperplasia)    CAD (coronary artery disease)    Chronic indwelling Foley catheter    CVA (cerebral vascular accident) ([place])    GERD (gastroesophageal reflux disease)    HTN (hypertension)    Neurogenic bladder    SSS (sick sinus syndrome) ([place])    s/p MDT ICD   Vitamin D deficiency     Past Surgical History:  Procedure Laterality Date   ABDOMINAL AORTOGRAM W/LOWER EXTREMITY N/A 03/23/2021   Procedure: ABDOMINAL AORTOGRAM W/LOWER EXTREMITY;  Surgeon: Waynetta Sandy, MD;  Location: [facility] [facility];  Service: Cardiovascular;  Laterality: N/A;   AMPUTATION Right 04/01/2021   Procedure: RIGHT GREAT TOE AMPUTATION;  Surgeon: Newt Minion, MD;  Location: [facility];  Service: Orthopedics;  Laterality: Right;   AMPUTATION Right 07/30/2021   Procedure: AMPUTATION ABOVE KNEE;  Surgeon: Broadus John, MD;  Location: [place];  Service: Vascular;  Laterality: Right;   APPENDECTOMY     APPLICATION OF WOUND VAC Right 04/01/2021   Procedure: APPLICATION OF WOUND VAC;  Surgeon: Newt Minion, MD;  Location: [facility];  Service: Orthopedics;  Laterality: Right;   CARDIAC  SURGERY     FEMORAL-TIBIAL BYPASS GRAFT Right 03/26/2021   Procedure: BYPASS GRAFT FEMORAL-TIBIAL ARTERY.  Harvest right leg saphenous vein, right vein patch angioplasty posterior tibial.;  Surgeon: Marty Heck, MD;  [facility];  Service: Vascular;  Laterality: Right;   IR US GUIDE BX ASP/DRAIN  08/03/2021   IR US GUIDE BX ASP/DRAIN  08/06/2021   PACEMAKER PLACEMENT       The history is provided by the patient. No language interpreter was used.       Home Medications Prior to Admission medications   Medication Sig Start Date End Date Taking? Authorizing Provider  albuterol (VENTOLIN HFA) 108 (90 Base) MCG/ACT inhaler INHALE 2 PUFFS INTO THE LUNGS EVERY 6 HOURS AS NEEDED FOR WHEEZING OR SHORTNESS OF BREATH 11/18/21   Charlott Rakes, MD  ascorbic acid (VITAMIN C) 500 MG tablet Take 1 tablet (500 mg total) by mouth 2 (two) times daily. 12/06/21   [name], NP  clopidogrel (PLAVIX) 75 MG tablet Take 1 tablet (75 mg total) by mouth at bedtime. 07/17/21   [name], NP  divalproex (DEPAKOTE) 125 MG DR tablet Take 1 tablet (125 mg total) by mouth at bedtime. 07/17/21 10/15/21  [name], NP  feeding supplement (ENSURE ENLIVE / ENSURE PLUS) LIQD Take 237 mLs by mouth 3 (three) times daily between meals. 10/03/21   Bonnielee Haff, MD  hydrALAZINE (APRESOLINE) 10 MG tablet Take 1 tablet (10 mg total) by mouth every 8 (  eight) hours. 10/19/21   Mercy Riding, MD  isosorbide mononitrate (IMDUR) 30 MG 24 hr tablet Take 0.5 tablets (15 mg total) by mouth daily. 10/19/21   Mercy Riding, MD  nitroGLYCERIN (NITROSTAT) 0.4 MG SL tablet Place 0.4 mg under the tongue every 5 (five) minutes x 3 doses as needed for chest pain.    [provider]  oxybutynin (DITROPAN-XL) 5 MG 24 hr tablet Take 1 tablet (5 mg total) by mouth daily. 12/06/21   [name], NP  pantoprazole (PROTONIX) 40 MG tablet TAKE 1 TABLET(40 MG) BY MOUTH DAILY 11/20/21   Charlott Rakes, MD  polyethylene  glycol (MIRALAX / GLYCOLAX) 17 g packet Take 17 g by mouth daily as needed for mild constipation. 07/17/21   [name], NP  QUEtiapine (SEROQUEL) 50 MG tablet Take 1 tablet (50 mg total) by mouth at bedtime. 12/06/21   [name], NP  senna-docusate (SENOKOT-S) 8.6-50 MG tablet TAKE 2 TABLETS BY MOUTH TWICE DAILY 01/13/22   [name], NP  vitamin B-12 (CYANOCOBALAMIN) 1000 MCG tablet Take 1 tablet (1,000 mcg total) by mouth daily. 07/17/21   [name], NP  Vitamin D, Ergocalciferol, (DRISDOL) 1.25 MG (50000 UNIT) CAPS capsule Take 1 capsule (50,000 Units total) by mouth every Monday. 11/23/21   [name], NP  zinc sulfate 220 (50 Zn) MG capsule Take 1 capsule (220 mg total) by mouth daily. 10/19/21   Mercy Riding, MD      Allergies    Food, Aspirin, Crestor [rosuvastatin calcium], [place] clam (m. mercenaria) skin test, and Penicillins    Review of Systems   Review of Systems  Constitutional:  Negative for diaphoresis and fever.  HENT:  Negative for congestion and postnasal drip.   Eyes:  Negative for discharge.  Respiratory:  Negative for cough and shortness of breath.   Cardiovascular:  Negative for chest pain.  Gastrointestinal:  Negative for abdominal pain, nausea and vomiting.  Genitourinary:  Negative for scrotal swelling and testicular pain.       Foley malfunction  Skin:  Negative for rash.  Neurological:  Negative for seizures.    Physical Exam Updated Vital Signs BP (!) 156/112 (BP Location: Left Arm)   Pulse 88   Temp (!) 97.3 F (36.3 C)   Resp 16   SpO2 100%  Physical Exam Vitals and nursing note reviewed.  Constitutional:      General: He is not in acute distress.    Appearance: Normal appearance. He is well-developed. He is not ill-appearing.     Comments: Frail  HENT:     Head: Normocephalic and atraumatic.     Right Ear: External ear normal.     Left Ear: External ear normal.     Mouth/Throat:     Mouth: Mucous membranes  are moist.  Eyes:     General: No scleral icterus. Cardiovascular:     Rate and Rhythm: Normal rate and regular rhythm.     Pulses: Normal pulses.     Heart sounds: Normal heart sounds.  Pulmonary:     Effort: Pulmonary effort is normal. No respiratory distress.     Breath sounds: Normal breath sounds.  Abdominal:     General: Abdomen is flat.     Palpations: Abdomen is soft.     Tenderness: There is no abdominal tenderness.  Genitourinary:   Musculoskeletal:        General: Normal range of motion.  Cervical back: Normal range of motion.     Right lower leg: No edema.     Left lower leg: No edema.  Skin:    General: Skin is warm and dry.     Capillary Refill: Capillary refill takes less than 2 seconds.  Neurological:     Mental Status: He is alert. Mental status is at baseline.  Psychiatric:        Mood and Affect: Mood normal.        Behavior: Behavior normal.     ED Results / Procedures / Treatments   Labs (all labs ordered are listed, but only abnormal results are displayed) Labs Reviewed - No data to display  EKG None  Radiology No results found.  Procedures SUPRAPUBIC TUBE PLACEMENT  Date/Time: 01/15/2022 1:24 AM  Performed by: Jeanell Sparrow, DO Authorized by: Jeanell Sparrow, DO   Consent:    Consent obtained:  Verbal   Consent given by:  Patient and healthcare agent   Risks, benefits, and alternatives were discussed: yes     Risks discussed:  Infection, pain and bleeding   Alternatives discussed:  Delayed treatment and alternative treatment Universal protocol:    Procedure explained and questions answered to patient or proxy's satisfaction: yes     Patient identity confirmed:  Arm band and provided demographic data Sedation:    Sedation type:  None Anesthesia:    Anesthesia method:  None Procedure details:    Complexity:  Simple   Catheter type:  Foley   Catheter size:  16 Fr   Ultrasound guidance: no     Number of attempts:  1   Urine  characteristics:  Yellow Post-procedure details:    Procedure completion:  Tolerated well, no immediate complications Comments:     Replacement of suprapubic 36f catheter     Medications Ordered in ED Medications  oxyCODONE-acetaminophen (PERCOCET/ROXICET) 5-325 MG per tablet 1 tablet (has no administration in time range)    ED Course/ Medical Decision Making/ A&P                           Medical Decision Making  This patient presents to the ED with chief complaint(s) of foley malfunction with pertinent past medical history of CVA, BPH, neurogenic bladder which further complicates the presenting complaint. The complaint involves an extensive differential diagnosis and also carries with it a high risk of complications and morbidity.    The differential diagnosis includes but not limited to Foley malfunction, clogged Foley, dislodged catheter, other acute etiologies were considered. Serious etiologies were considered.   The initial plan is to irrigate catheter, replace necessary   Additional history obtained: Additional history obtained from family Records reviewed previous admission documents, Primary Care Documents, and prior ED visits, prior labs and imaging  Independent labs interpretation:  The following labs were independently interpreted: N/A  Independent visualization of imaging:   Cardiac monitoring was reviewed and interpreted by myself which shows N/A  Treatment and Reassessment: Attempted to irrigate suprapubic catheter, unable to irrigate appropriately.  Will plan to exchange catheter  Consultation: - Consulted or discussed management/test interpretation w/ external professional: n/a  Consideration for admission or further workup: Admission was considered; feel patient benefit from further outpatient management of his chronic conditions.  Follow-up with urology.  Social Determinants of health: Social History   Tobacco Use   Smoking status: Former     Types: Cigarettes    Passive exposure: Never  Smokeless tobacco: Never  Substance Use Topics   Alcohol use: Not Currently   Drug use: Never     Suprapubic catheter was replaced successfully, good flow of urine after replacement.  No significant discomfort with balloon inflation.  Discussed Foley care with patient and family bedside.  Plan discharge with urology follow-up and PCP follow-up.  The patient improved significantly and was discharged in stable condition. Detailed discussions were had with the patient regarding current findings, and need for close f/u with PCP or on call doctor. The patient has been instructed to return immediately if the symptoms worsen in any way for re-evaluation. Patient verbalized understanding and is in agreement with current care plan. All questions answered prior to discharge.          Final Clinical Impression(s) / ED Diagnoses Final diagnoses:  Malfunction of Foley catheter, initial encounter [facility])    Rx / DC Orders ED Discharge Orders     None         Jeanell Sparrow, DO 01/15/22 0129

## 2022-01-20 ENCOUNTER — Telehealth: Payer: Self-pay

## 2022-01-20 NOTE — Telephone Encounter (Signed)
I spoke to [place] to inquire if the  patient ever received a wheelchair. She said they received an order in May and attempted to call the patient multiple times, they even went to his home and left a tag at the door stating they had been there and they never heard back. The order has been cancelled.   I then spoke to [place], PTA/ [facility] and she said that the patient has 2 borrowed wheelchairs but is in need of a new one.  I explained that [place] tried to contact him multiple times and were not able to reach him.  I will need to see if they will re-instate the order.  I called [place] and spoke to [place]. She said they will need a new order for the wheelchair and documentation to support the need. She said the provider may addend the visit note from 10/23/2021 if appropriate or a new face to face encounter with the patient will need to be scheduled.  I called Tammy, PTA back and updated her, noting a new order will need to be submitted.

## 2022-01-20 NOTE — Telephone Encounter (Signed)
I will place notes in chart for 01-27-2022 office visit

## 2022-01-27 ENCOUNTER — Ambulatory Visit: Payer: Medicare Other | Admitting: Nurse Practitioner

## 2022-02-12 ENCOUNTER — Ambulatory Visit: Payer: Self-pay | Admitting: *Deleted

## 2022-02-12 NOTE — Telephone Encounter (Signed)
Summary: side and stomach pain   Caller states pt is having side and stomach pain x2d   Caller is not currently w/ pt and is not listed on DPR   Please assist further      Chief Complaint: abdominal pain per daughter Symptoms: left side constant pain , abdominal pain left side. Firm to touch. Has foley catheter and c/o burning . Daughter has been giving AZO . Urine color normal  "looks good".  Frequency: 2 days  Pertinent Negatives: Patient denies fever,  Disposition: '[x]'$ ED /'[]'$ Urgent Care (no appt availability in office) / '[]'$ Appointment(In office/virtual)/ '[]'$  [facility]/ '[]'$ Home Care/ '[]'$ Refused Recommended Disposition /'[]'$ [place] [facility]/ '[]'$  Follow-up with PCP Additional Notes:   Daughter ,[name] primary healthcare caregiver not listed on DPR. Would like name listed on DPR. Recommended ED due to constant abdominal pain.  Recommended patient not to eat and daughter reports she will let patient eat now and take to ED around 7 pm. Attempting to miss busy time in ED. Daughter requesting all medications that need refill please refill and requesting order for diapers, wipes, gloves and "chucks" be sent to pharmacy. Daughter is paying out of pocket at this time. Please advise .     Reason for Disposition  [1] MILD-MODERATE pain AND [2] constant AND [3] present > 2 hours  Answer Assessment - Initial Assessment Questions 1. LOCATION: "Where does it hurt?"      Left side and abdomen  2. RADIATION: "Does the pain shoot anywhere else?" (e.g., chest, back)     No  3. ONSET: "When did the pain begin?" (Minutes, hours or days ago)      2 days 4. SUDDEN: "Gradual or sudden onset?"     na 5. PATTERN "Does the pain come and go, or is it constant?"    - If it comes and goes: "How long does it last?" "Do you have pain now?"     (Note: Comes and goes means the pain is intermittent. It goes away completely between bouts.)    - If constant: "Is it getting better, staying the same,  or getting worse?"      (Note: Constant means the pain never goes away completely; most serious pain is constant and gets worse.)      Constant  6. SEVERITY: "How bad is the pain?"  (e.g., Scale 1-10; mild, moderate, or severe)    - MILD (1-3): Doesn't interfere with normal activities, abdomen soft and not tender to touch.     - MODERATE (4-7): Interferes with normal activities or awakens from sleep, abdomen tender to touch.     - SEVERE (8-10): Excruciating pain, doubled over, unable to do any normal activities.       Feels firm to touch  7. RECURRENT SYMPTOM: "Have you ever had this type of stomach pain before?" If Yes, ask: "When was the last time?" and "What happened that time?"      na 8. CAUSE: "What do you think is causing the stomach pain?"     Not sure  9. RELIEVING/AGGRAVATING FACTORS: "What makes it better or worse?" (e.g., antacids, bending or twisting motion, bowel movement)     AZO given for catheter burning  10. OTHER SYMPTOMS: "Do you have any other symptoms?" (e.g., back pain, diarrhea, fever, urination pain, vomiting)       Left side and abdominal pain . Feels firm to touch  Protocols used: Abdominal Pain - Male-A-AH

## 2022-02-12 NOTE — Telephone Encounter (Signed)
Please advise on medication refills.

## 2022-02-12 NOTE — Telephone Encounter (Signed)
FYI

## 2022-02-13 ENCOUNTER — Other Ambulatory Visit: Payer: Self-pay

## 2022-02-13 ENCOUNTER — Encounter (HOSPITAL_COMMUNITY): Payer: Self-pay

## 2022-02-13 ENCOUNTER — Emergency Department (HOSPITAL_COMMUNITY): Payer: Medicare Other

## 2022-02-13 ENCOUNTER — Emergency Department (HOSPITAL_COMMUNITY)
Admission: EM | Admit: 2022-02-13 | Discharge: 2022-02-13 | Disposition: A | Discharge status: Home / Self Care | Payer: Medicare Other | Attending: Emergency Medicine | Admitting: Emergency Medicine

## 2022-02-13 DIAGNOSIS — Z8673 Personal history of transient ischemic attack (TIA), and cerebral infarction without residual deficits: Secondary | ICD-10-CM | POA: Diagnosis not present

## 2022-02-13 DIAGNOSIS — M546 Pain in thoracic spine: Secondary | ICD-10-CM | POA: Diagnosis not present

## 2022-02-13 DIAGNOSIS — I10 Essential (primary) hypertension: Secondary | ICD-10-CM | POA: Diagnosis not present

## 2022-02-13 DIAGNOSIS — R0789 Other chest pain: Secondary | ICD-10-CM | POA: Insufficient documentation

## 2022-02-13 DIAGNOSIS — R1012 Left upper quadrant pain: Secondary | ICD-10-CM

## 2022-02-13 DIAGNOSIS — Z7902 Long term (current) use of antithrombotics/antiplatelets: Secondary | ICD-10-CM | POA: Diagnosis not present

## 2022-02-13 DIAGNOSIS — R109 Unspecified abdominal pain: Secondary | ICD-10-CM | POA: Diagnosis present

## 2022-02-13 LAB — CBC
HCT: 43.8 % (ref 39.0–52.0)
Hemoglobin: 13.9 g/dL (ref 13.0–17.0)
MCH: 28.7 pg (ref 26.0–34.0)
MCHC: 31.7 g/dL (ref 30.0–36.0)
MCV: 90.5 fL (ref 80.0–100.0)
Platelets: 192 10*3/uL (ref 150–400)
RBC: 4.84 MIL/uL (ref 4.22–5.81)
RDW: 15.1 % (ref 11.5–15.5)
WBC: 3.8 10*3/uL — ABNORMAL LOW (ref 4.0–10.5)
nRBC: 0 % (ref 0.0–0.2)

## 2022-02-13 LAB — URINALYSIS, ROUTINE W REFLEX MICROSCOPIC
Bacteria, UA: NONE SEEN
Glucose, UA: NEGATIVE mg/dL
Hgb urine dipstick: NEGATIVE
Ketones, ur: NEGATIVE mg/dL
Nitrite: NEGATIVE
Protein, ur: >=300 mg/dL — AB
Specific Gravity, Urine: 1.027 (ref 1.005–1.030)
pH: 8 (ref 5.0–8.0)

## 2022-02-13 LAB — COMPREHENSIVE METABOLIC PANEL
ALT: 23 U/L (ref 0–44)
AST: 14 U/L — ABNORMAL LOW (ref 15–41)
Albumin: 3.2 g/dL — ABNORMAL LOW (ref 3.5–5.0)
Alkaline Phosphatase: 104 U/L (ref 38–126)
Anion gap: 8 (ref 5–15)
BUN: 16 mg/dL (ref 8–23)
CO2: 27 mmol/L (ref 22–32)
Calcium: 10 mg/dL (ref 8.9–10.3)
Chloride: 108 mmol/L (ref 98–111)
Creatinine, Ser: 0.77 mg/dL (ref 0.61–1.24)
GFR, Estimated: >60 mL/min (ref >60)
Glucose, Bld: 107 mg/dL — ABNORMAL HIGH (ref 70–99)
Potassium: 3.2 mmol/L — ABNORMAL LOW (ref 3.5–5.1)
Sodium: 143 mmol/L (ref 135–145)
Total Bilirubin: 0.5 mg/dL (ref 0.3–1.2)
Total Protein: 6.7 g/dL (ref 6.5–8.1)

## 2022-02-13 LAB — TROPONIN I (HIGH SENSITIVITY)
Troponin I (High Sensitivity): 29 ng/L — ABNORMAL HIGH (ref <18)
Troponin I (High Sensitivity): 26 ng/L — ABNORMAL HIGH (ref <18)

## 2022-02-13 LAB — LIPASE, BLOOD: Lipase: 25 U/L (ref 11–51)

## 2022-02-13 MED ORDER — IOHEXOL 300 MG/ML  SOLN
100.0000 mL | Freq: Once | INTRAMUSCULAR | Status: AC | PRN
Start: 2022-02-13 — End: 2022-02-13
  Administered 2022-02-13: 100 mL via INTRAVENOUS

## 2022-02-13 MED ORDER — POTASSIUM CHLORIDE CRYS ER 20 MEQ PO TBCR: 20.0000 meq | EXTENDED_RELEASE_TABLET | Freq: Every day | ORAL | 0 refills | Status: DC

## 2022-02-13 MED ORDER — ACETAMINOPHEN 325 MG PO TABS
650.0000 mg | ORAL_TABLET | Freq: Once | ORAL | Status: AC
  Administered 2022-02-13: 650 mg via ORAL
  Filled 2022-02-13: qty 2

## 2022-02-13 MED ORDER — SODIUM CHLORIDE 0.9 % IV BOLUS
1000.0000 mL | Freq: Once | INTRAVENOUS | Status: AC
  Administered 2022-02-13: 1000 mL via INTRAVENOUS

## 2022-02-13 MED ORDER — NAPROXEN 250 MG PO TABS
375.0000 mg | ORAL_TABLET | Freq: Once | ORAL | Status: AC
  Administered 2022-02-13: 375 mg via ORAL
  Filled 2022-02-13: qty 2

## 2022-02-13 NOTE — ED Notes (Signed)
This RN contacted granddaughter [name], family is coming to pick pt up.

## 2022-02-13 NOTE — ED Triage Notes (Addendum)
Patient having left upper quad pain.  PCP advised to come to ER.  Patient has chronic catheter which continues to have burning. Chronic foley catheter.  Family reports he is having normal bowel movement

## 2022-02-13 NOTE — ED Notes (Signed)
Patient verbalizes understanding of d/c instructions. Opportunities for questions and answers were provided. Pt d/c from ED and wheeled to lobby where staff is watching over until granddaughter/family picks him up.

## 2022-02-13 NOTE — ED Notes (Signed)
Pt.has catheter at

## 2022-02-13 NOTE — ED Provider Notes (Signed)
[place] EMERGENCY DEPARTMENT Provider Note   CSN: [id number] Arrival date & time: 02/13/22  1052     History  Chief Complaint  Patient presents with   Abdominal Pain    Todd Dunn is a 81 y.o. male.  HPI     81 year old male comes in with chief complaint of abdominal pain. Patient accompanied by his granddaughter.  Patient has history of hypertension, neurogenic bladder, stroke. Per family, patient has been complaining of left-sided abdominal pain.  Patient's PCP saw him in the PCP and was sent to the emergency room.  Patient has history of suprapubic catheter.  In the past he had required suprapubic catheter placement.  However family states that patient has been voiding okay.    Review of systems negative for any fevers or chills.  Home Medications Prior to Admission medications   Medication Sig Start Date End Date Taking? Authorizing Provider  albuterol (VENTOLIN HFA) 108 (90 Base) MCG/ACT inhaler INHALE 2 PUFFS INTO THE LUNGS EVERY 6 HOURS AS NEEDED FOR WHEEZING OR SHORTNESS OF BREATH 11/18/21   Charlott Rakes, MD  ascorbic acid (VITAMIN C) 500 MG tablet Take 1 tablet (500 mg total) by mouth 2 (two) times daily. 12/06/21   [name], NP  clopidogrel (PLAVIX) 75 MG tablet Take 1 tablet (75 mg total) by mouth at bedtime. 07/17/21   [name], NP  divalproex (DEPAKOTE) 125 MG DR tablet Take 1 tablet (125 mg total) by mouth at bedtime. 07/17/21 10/15/21  [name], NP  feeding supplement (ENSURE ENLIVE / ENSURE PLUS) LIQD Take 237 mLs by mouth 3 (three) times daily between meals. 10/03/21   Bonnielee Haff, MD  hydrALAZINE (APRESOLINE) 10 MG tablet Take 1 tablet (10 mg total) by mouth every 8 (eight) hours. 10/19/21   Mercy Riding, MD  isosorbide mononitrate (IMDUR) 30 MG 24 hr tablet Take 0.5 tablets (15 mg total) by mouth daily. 10/19/21   Mercy Riding, MD  nitroGLYCERIN (NITROSTAT) 0.4 MG SL tablet Place 0.4 mg under the tongue  every 5 (five) minutes x 3 doses as needed for chest pain.    [provider]  oxybutynin (DITROPAN-XL) 5 MG 24 hr tablet Take 1 tablet (5 mg total) by mouth daily. 12/06/21   [name], NP  pantoprazole (PROTONIX) 40 MG tablet TAKE 1 TABLET(40 MG) BY MOUTH DAILY 11/20/21   Charlott Rakes, MD  polyethylene glycol (MIRALAX / GLYCOLAX) 17 g packet Take 17 g by mouth daily as needed for mild constipation. 07/17/21   [name], NP  QUEtiapine (SEROQUEL) 50 MG tablet Take 1 tablet (50 mg total) by mouth at bedtime. 12/06/21   [name], NP  senna-docusate (SENOKOT-S) 8.6-50 MG tablet TAKE 2 TABLETS BY MOUTH TWICE DAILY 01/13/22   [name], NP  vitamin B-12 (CYANOCOBALAMIN) 1000 MCG tablet Take 1 tablet (1,000 mcg total) by mouth daily. 07/17/21   [name], NP  Vitamin D, Ergocalciferol, (DRISDOL) 1.25 MG (50000 UNIT) CAPS capsule Take 1 capsule (50,000 Units total) by mouth every Monday. 11/23/21   [name], NP  zinc sulfate 220 (50 Zn) MG capsule Take 1 capsule (220 mg total) by mouth daily. 10/19/21   Mercy Riding, MD      Allergies    Food, Aspirin, Crestor [rosuvastatin calcium], Northern quahog clam (m. mercenaria) skin test, and Penicillins    Review of Systems   Review of Systems  Physical Exam Updated Vital Signs  BP (!) 140/111   Pulse 87   Temp 98.5 F (36.9 C)   Resp (!) 24   Ht 6' (1.829 m)   Wt 49.9 kg   SpO2 100%   BMI 14.92 kg/m  Physical Exam Vitals and nursing note reviewed.  Constitutional:      Appearance: He is well-developed.  HENT:     Head: Atraumatic.  Cardiovascular:     Rate and Rhythm: Normal rate.  Pulmonary:     Effort: Pulmonary effort is normal.  Abdominal:     Tenderness: There is abdominal tenderness.     Comments: Patient has tenderness over the lower left thoracic region and left upper quadrant.  No significant flank tenderness.  Suprapubic Foley catheter in place.  There is about 50 to 100 cc of  urine in the bag  Musculoskeletal:     Cervical back: Neck supple.  Skin:    General: Skin is warm.  Neurological:     Mental Status: He is alert and oriented to person, place, and time.     ED Results / Procedures / Treatments   Labs (all labs ordered are listed, but only abnormal results are displayed) Labs Reviewed  COMPREHENSIVE METABOLIC PANEL - Abnormal; Notable for the following components:      Result Value   Potassium 3.2 (*)    Glucose, Bld 107 (*)    Albumin 3.2 (*)    AST 14 (*)    All other components within normal limits  CBC - Abnormal; Notable for the following components:   WBC 3.8 (*)    All other components within normal limits  TROPONIN I (HIGH SENSITIVITY) - Abnormal; Notable for the following components:   Troponin I (High Sensitivity) 29 (*)    All other components within normal limits  TROPONIN I (HIGH SENSITIVITY) - Abnormal; Notable for the following components:   Troponin I (High Sensitivity) 26 (*)    All other components within normal limits  URINE CULTURE  LIPASE, BLOOD  URINALYSIS, ROUTINE W REFLEX MICROSCOPIC    EKG None  Radiology No results found.  Procedures Procedures    Medications Ordered in ED Medications  sodium chloride 0.9 % bolus 1,000 mL (has no administration in time range)  naproxen (NAPROSYN) tablet 375 mg (375 mg Oral Given 02/13/22 1435)  acetaminophen (TYLENOL) tablet 650 mg (650 mg Oral Given 02/13/22 1435)    ED Course/ Medical Decision Making/ A&P                           Medical Decision Making Amount and/or Complexity of Data Reviewed Labs: ordered.  Risk OTC drugs. Prescription drug management.   81 year old comes in with chief complaint of abdominal pain.  Patient has history of stroke, suprapubic catheter.  In the past he has had complications related to the catheter and cystitis.  He comes in complaining of left-sided abdominal pain, appears that the pain has been present at least for a day.   He was seen by PCP yesterday and was advised to come to the emergency room.  Additional history provided by patient's granddaughter, and the daughter who I called.  They state that the Foley catheter is draining okay, that the pain is little different this time around than previous episodes.  I have reviewed patient's prior CT scans.  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  CT PE in May-2023 was negative for blood clots.  Patient's basic  labs are reassuring.  Plan is to get CT abdomen pelvis with contrast and reassess the patient.  If CT is reassuring, will discharge him.  This plan has been discussed with the patient's family.  Final Clinical Impression(s) / ED Diagnoses Final diagnoses:  Chest wall pain  Left upper quadrant abdominal pain    Rx / DC Orders ED Discharge Orders     None         Varney Biles, MD 02/13/22 1536

## 2022-02-13 NOTE — ED Provider Notes (Signed)
  Provider Note MRN:  [id number]  Arrival date & time: 02/13/22    ED Course and Medical Decision Making  Assumed care from [place] at shift change.  See note from prior team for complete details, in brief:   81 yo male Cc/ abd pain, LUQ Hx suprapubic cath, appears to be draining appropriately. CVA, neurogenic bladder K is mildly reduced, will give Rx Trop is mildly elevated but downtrending, ECG w/o stemi; trop is comparable to his baseline level CTAP stable  UA stable.  No ongoing pain, his symptoms have resolved. Requesting something to eat.   Etiology of his complaints is unclear, does not appear to be an acute emergent condition present at this time.   The patient improved significantly and was discharged in stable condition. Detailed discussions were had with the patient regarding current findings, and need for close f/u with PCP or on call doctor. The patient has been instructed to return immediately if the symptoms worsen in any way for re-evaluation. Patient verbalized understanding and is in agreement with current care plan. All questions answered prior to discharge.   Procedures  Final Clinical Impressions(s) / ED Diagnoses     ICD-10-CM   1. Chest wall pain  R07.89     2. Left upper quadrant abdominal pain  R10.12       ED Discharge Orders          Ordered    potassium chloride SA (KLOR-CON M) 20 MEQ tablet  Daily        02/13/22 2008              Discharge Instructions      You were seen in the ER for pain.  The work-up in the emergency room that includes x-ray, cat scan of the abdomen, and blood work is all reassuring.  We recommend that you take Tylenol for pain control.    Please see your PCP in the next 2-3 days for recheck and to recheck your potassium level  Please return to the ER if your symptoms worsen; you have increased pain, fevers, chills, inability to keep any medications down, confusion. Otherwise see the outpatient doctor as  requested.        Jeanell Sparrow, DO 02/13/22 2016

## 2022-02-13 NOTE — Discharge Instructions (Addendum)
You were seen in the ER for pain.  The work-up in the emergency room that includes x-ray, cat scan of the abdomen, and blood work is all reassuring.  We recommend that you take Tylenol for pain control.    Please see your PCP in the next 2-3 days for recheck and to recheck your potassium level  Please return to the ER if your symptoms worsen; you have increased pain, fevers, chills, inability to keep any medications down, confusion. Otherwise see the outpatient doctor as requested.

## 2022-02-13 NOTE — ED Notes (Signed)
Pt.unable to collect has container at

## 2022-02-14 ENCOUNTER — Encounter (HOSPITAL_BASED_OUTPATIENT_CLINIC_OR_DEPARTMENT_OTHER): Payer: Self-pay

## 2022-02-14 ENCOUNTER — Emergency Department (HOSPITAL_BASED_OUTPATIENT_CLINIC_OR_DEPARTMENT_OTHER)
Admission: EM | Admit: 2022-02-14 | Discharge: 2022-02-14 | Disposition: A | Discharge status: Home / Self Care | Payer: Medicare Other | Attending: Emergency Medicine | Admitting: Emergency Medicine

## 2022-02-14 ENCOUNTER — Other Ambulatory Visit: Payer: Self-pay

## 2022-02-14 DIAGNOSIS — Z7902 Long term (current) use of antithrombotics/antiplatelets: Secondary | ICD-10-CM | POA: Diagnosis not present

## 2022-02-14 DIAGNOSIS — T83098A Other mechanical complication of other indwelling urethral catheter, initial encounter: Secondary | ICD-10-CM | POA: Diagnosis present

## 2022-02-14 DIAGNOSIS — Z950 Presence of cardiac pacemaker: Secondary | ICD-10-CM | POA: Diagnosis not present

## 2022-02-14 DIAGNOSIS — T83091A Other mechanical complication of indwelling urethral catheter, initial encounter: Secondary | ICD-10-CM

## 2022-02-14 DIAGNOSIS — I2510 Atherosclerotic heart disease of native coronary artery without angina pectoris: Secondary | ICD-10-CM | POA: Diagnosis not present

## 2022-02-14 DIAGNOSIS — I10 Essential (primary) hypertension: Secondary | ICD-10-CM | POA: Diagnosis not present

## 2022-02-14 DIAGNOSIS — Z79899 Other long term (current) drug therapy: Secondary | ICD-10-CM | POA: Insufficient documentation

## 2022-02-14 NOTE — ED Triage Notes (Signed)
Patient here POV from Home.  Endorses History of Suprapublic Catheter and Family Member states it has not been draining well for approximately 2 Days.  Seen at another [facility] but this Issue was not addressed then. 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   NAD Noted during Triage. A&Ox4. GCS 15. BIB Personal Wheelchair.

## 2022-02-14 NOTE — ED Notes (Addendum)
Pt brief and linens changed, peri-care performed.  Sacral pressure ulcer noted. Granddaughter is caregiver at bedside and is aware of the PU, states that pt's PCP is aware and that PU has been there and at same stage for many months.

## 2022-02-14 NOTE — ED Provider Notes (Signed)
[place] EMERGENCY DEPT Provider Note   CSN: [id number] Arrival date & time: 02/14/22  1403     History  No chief complaint on file.   Todd Dunn is a 81 y.o. male.  Patient is an 81 year old male with multiple medical problems including neurogenic bladder, BPH, chronic suprapubic Foley catheter, CVA, CAD, sick sinus syndrome status post pacemaker, hypertension who is returning to the emergency room today due to his catheter not draining well.  He was seen in the hospital yesterday for chest pain and had a full evaluation including a CT of his abdomen pelvis but everything appeared to be baseline and he was discharged home.  The caregiver with him reports that there was some issues with draining of his catheter yesterday but he was supposed to have it changed tomorrow so they just did not worry about it.  However today he has just been continually leaking urine around the catheter soaking his bed and clothing.  It was last changed approximately 3 weeks ago.  They have no other complaints at this time and just want the catheter to be changed.  The history is provided by a caregiver.       Home Medications Prior to Admission medications   Medication Sig Start Date End Date Taking? Authorizing Provider  albuterol (VENTOLIN HFA) 108 (90 Base) MCG/ACT inhaler INHALE 2 PUFFS INTO THE LUNGS EVERY 6 HOURS AS NEEDED FOR WHEEZING OR SHORTNESS OF BREATH 11/18/21   Charlott Rakes, MD  ascorbic acid (VITAMIN C) 500 MG tablet Take 1 tablet (500 mg total) by mouth 2 (two) times daily. 12/06/21   [name], NP  clopidogrel (PLAVIX) 75 MG tablet Take 1 tablet (75 mg total) by mouth at bedtime. 07/17/21   [name], NP  divalproex (DEPAKOTE) 125 MG DR tablet Take 1 tablet (125 mg total) by mouth at bedtime. 07/17/21 10/15/21  [name], NP  feeding supplement (ENSURE ENLIVE / ENSURE PLUS) LIQD Take 237 mLs by mouth 3 (three) times daily between meals. 10/03/21    Bonnielee Haff, MD  hydrALAZINE (APRESOLINE) 10 MG tablet Take 1 tablet (10 mg total) by mouth every 8 (eight) hours. 10/19/21   Mercy Riding, MD  isosorbide mononitrate (IMDUR) 30 MG 24 hr tablet Take 0.5 tablets (15 mg total) by mouth daily. 10/19/21   Mercy Riding, MD  nitroGLYCERIN (NITROSTAT) 0.4 MG SL tablet Place 0.4 mg under the tongue every 5 (five) minutes x 3 doses as needed for chest pain.    [provider]  oxybutynin (DITROPAN-XL) 5 MG 24 hr tablet Take 1 tablet (5 mg total) by mouth daily. 12/06/21   [name], NP  pantoprazole (PROTONIX) 40 MG tablet TAKE 1 TABLET(40 MG) BY MOUTH DAILY 11/20/21   Charlott Rakes, MD  polyethylene glycol (MIRALAX / GLYCOLAX) 17 g packet Take 17 g by mouth daily as needed for mild constipation. 07/17/21   [name], NP  potassium chloride SA (KLOR-CON M) 20 MEQ tablet Take 1 tablet (20 mEq total) by mouth daily for 3 days. 02/13/22 02/16/22  Jeanell Sparrow, DO  QUEtiapine (SEROQUEL) 50 MG tablet Take 1 tablet (50 mg total) by mouth at bedtime. 12/06/21   [name], NP  senna-docusate (SENOKOT-S) 8.6-50 MG tablet TAKE 2 TABLETS BY MOUTH TWICE DAILY 01/13/22   [name], NP  vitamin B-12 (CYANOCOBALAMIN) 1000 MCG tablet Take 1 tablet (1,000 mcg total) by mouth daily. 07/17/21   [name],  NP  Vitamin D, Ergocalciferol, (DRISDOL) 1.25 MG (50000 UNIT) CAPS capsule Take 1 capsule (50,000 Units total) by mouth every Monday. 11/23/21   [name], NP  zinc sulfate 220 (50 Zn) MG capsule Take 1 capsule (220 mg total) by mouth daily. 10/19/21   Mercy Riding, MD      Allergies    Food, Aspirin, Crestor [rosuvastatin calcium], Northern quahog clam (m. mercenaria) skin test, and Penicillins    Review of Systems   Review of Systems  Physical Exam Updated Vital Signs BP 139/78 (BP Location: Left Arm)   Pulse 78   Temp 97.6 F (36.4 C)   Resp 18   Ht 6' (1.829 m)   Wt 49.9 kg   SpO2 92%   BMI 14.92 kg/m  Physical  Exam Vitals and nursing note reviewed.  Constitutional:      General: He is not in acute distress.    Appearance: He is well-developed.  HENT:     Head: Normocephalic and atraumatic.  Eyes:     Conjunctiva/sclera: Conjunctivae normal.     Pupils: Pupils are equal, round, and reactive to light.  Cardiovascular:     Rate and Rhythm: Normal rate.  Pulmonary:     Effort: Pulmonary effort is normal. No respiratory distress.  Abdominal:     General: There is no distension.     Palpations: Abdomen is soft.     Tenderness: There is no abdominal tenderness. There is no guarding or rebound.     Comments: Superpubic catheter in place  Musculoskeletal:        General: No tenderness. Normal range of motion.     Comments: Right BKA  Skin:    General: Skin is warm and dry.     Findings: No erythema or rash.  Neurological:     Mental Status: He is alert.  Psychiatric:        Behavior: Behavior normal.     ED Results / Procedures / Treatments   Labs (all labs ordered are listed, but only abnormal results are displayed) Labs Reviewed - No data to display  EKG None  Radiology CT ABDOMEN PELVIS W CONTRAST  Result Date: 02/13/2022 CLINICAL DATA:  Left upper quadrant pain. EXAM: CT ABDOMEN AND PELVIS WITH CONTRAST TECHNIQUE: Multidetector CT imaging of the abdomen and pelvis was performed using the standard protocol following bolus administration of intravenous contrast. RADIATION DOSE REDUCTION: This exam was performed according to the departmental dose-optimization program which includes automated exposure control, adjustment of the mA and/or kV according to patient size and/or use of iterative reconstruction technique. CONTRAST:  170m OMNIPAQUE IOHEXOL 300 MG/ML  SOLN COMPARISON:  11/18/2021 and 01/21/2021 FINDINGS: Lower Chest: Small bilateral pleural effusions and bibasilar atelectasis. Hepatobiliary: No hepatic masses identified. Gallbladder is unremarkable. No evidence of biliary ductal  dilatation. Pancreas:  No mass or inflammatory changes. Spleen: Within normal limits in size and appearance. Adrenals/Urinary Tract: Stable 2 cm left adrenal mass, consistent with benign adenoma. No suspicious renal masses identified. No evidence of ureteral calculi or hydronephrosis. Suprapubic Foley catheter is again seen within the bladder. Stomach/Bowel: No evidence of obstruction, inflammatory process or abnormal fluid collections. Vascular/Lymphatic: No pathologically enlarged lymph nodes. No acute vascular findings. Aortic atherosclerotic calcification incidentally noted. Reproductive:  Stable moderately enlarged prostate gland. Other:  None. Musculoskeletal:  No suspicious bone lesions identified. IMPRESSION: No acute findings within the abdomen or pelvis. Stable moderately enlarged prostate. Stable benign left adrenal adenoma. Small bilateral pleural effusions and bibasilar atelectasis.  Aortic Atherosclerosis (ICD10-I70.0). Electronically Signed   By: Marlaine Hind M.D.   On: 02/13/2022 18:32    Procedures Procedures    Medications Ordered in ED Medications - No data to display  ED Course/ Medical Decision Making/ A&P                           Medical Decision Making  Patient here today requesting changing of his suprapubic catheter.  It has been leaking all over his close and was last changed approximately 3 weeks ago.  Patient has no abdominal pain and no reason to suspect a new UTI.  He had a full work-up yesterday including a CT without new findings.  Suprapubic catheter was changed without difficulty.  Patient is stable for discharge home.  39fench cath replaced.        Final Clinical Impression(s) / ED Diagnoses Final diagnoses:  Obstructed Foley catheter, initial encounter ([facility]    Rx / DC Orders ED Discharge Orders     None         PBlanchie Dessert MD 02/14/22 1750

## 2022-02-14 NOTE — ED Notes (Signed)
Pt bladder scanned, 41m urine in bladder. Pt's pants and brief soaked through with urine.  Little urine collected in urinary collection bag.

## 2022-02-16 LAB — URINE CULTURE: Culture: >=100000 — AB

## 2022-02-17 ENCOUNTER — Telehealth: Payer: Self-pay | Admitting: Nurse Practitioner

## 2022-02-17 ENCOUNTER — Telehealth: Payer: Self-pay

## 2022-02-17 DIAGNOSIS — I6930 Unspecified sequelae of cerebral infarction: Secondary | ICD-10-CM

## 2022-02-17 DIAGNOSIS — K5909 Other constipation: Secondary | ICD-10-CM

## 2022-02-17 MED ORDER — OXYBUTYNIN CHLORIDE ER 5 MG PO TB24: 5.0000 mg | ORAL_TABLET | Freq: Every day | ORAL | 0 refills | Status: AC

## 2022-02-17 MED ORDER — ASCORBIC ACID 500 MG PO TABS: 500.0000 mg | ORAL_TABLET | Freq: Two times a day (BID) | ORAL | 0 refills | Status: AC

## 2022-02-17 MED ORDER — DIVALPROEX SODIUM 125 MG PO DR TAB: 125.0000 mg | DELAYED_RELEASE_TABLET | Freq: Every day | ORAL | 0 refills | Status: AC

## 2022-02-17 MED ORDER — CLOPIDOGREL BISULFATE 75 MG PO TABS: 75.0000 mg | ORAL_TABLET | Freq: Every day | ORAL | 0 refills | Status: AC

## 2022-02-17 MED ORDER — ALBUTEROL SULFATE HFA 108 (90 BASE) MCG/ACT IN AERS: 2.0000 | INHALATION_SPRAY | Freq: Four times a day (QID) | RESPIRATORY_TRACT | 0 refills | Status: AC | PRN

## 2022-02-17 MED ORDER — PANTOPRAZOLE SODIUM 40 MG PO TBEC
DELAYED_RELEASE_TABLET | ORAL | 0 refills | Status: AC
Start: 2022-02-17 — End: ?

## 2022-02-17 MED ORDER — POLYETHYLENE GLYCOL 3350 17 G PO PACK: 17.0000 g | PACK | Freq: Every day | ORAL | 0 refills | Status: AC | PRN

## 2022-02-17 NOTE — Telephone Encounter (Signed)
Post ED Visit - Positive Culture Follow-up  Culture report reviewed by antimicrobial stewardship pharmacist: [place] Team '[x]'$  Eliseo Gum, Pharm.D. '[]'$  Heide Guile, Pharm.D., BCPS AQ-ID '[]'$  Parks Neptune, Pharm.D., BCPS '[]'$  Alycia Rossetti, Pharm.D., BCPS '[]'$  [place], Pharm.D., BCPS, AAHIVP '[]'$  Legrand Como, Pharm.D., BCPS, AAHIVP '[]'$  Salome Arnt, PharmD, BCPS '[]'$  Johnnette Gourd, PharmD, BCPS '[]'$  Hughes Better, PharmD, BCPS '[]'$  Leeroy Cha, PharmD '[]'$  Laqueta Linden, PharmD, BCPS '[]'$  Albertina Parr, PharmD  [place] Team '[]'$  Leodis Sias, PharmD '[]'$  Lindell Spar, PharmD '[]'$  Royetta Asal, PharmD '[]'$  Graylin Shiver, Rph '[]'$  Rema Fendt) Glennon Mac, PharmD '[]'$  Arlyn Dunning, PharmD '[]'$  Netta Cedars, PharmD '[]'$  Dia Sitter, PharmD '[]'$  Leone Haven, PharmD '[]'$  Gretta Arab, PharmD '[]'$  Theodis Shove, PharmD '[]'$  Peggyann Juba, PharmD '[]'$  Reuel Boom, PharmD   Positive urine culture Reviewed by ED provider: Ezequiel Essex, MD Not treated. No treatment indicated organism sensitive to the same and no further patient follow-up is required at this time.  Glennon Hamilton 02/17/2022, 10:13 AM

## 2022-02-17 NOTE — Telephone Encounter (Signed)
Patient's grand daughter's is unsure of medication that needs to be refilled, please advise.

## 2022-02-17 NOTE — Progress Notes (Signed)
ED Antimicrobial Stewardship Positive Culture Follow Up   Todd Dunn is an 81 y.o. male who presented to [facility] on 02/13/2022 with a chief complaint of  Chief Complaint  Patient presents with   Abdominal Pain    Recent Results (from the past 720 hour(s))  Urine Culture     Status: Abnormal   Collection Time: 02/13/22  8:52 PM   Specimen: Urine, Catheterized  Result Value Ref Range Status   Specimen Description URINE, CATHETERIZED  Final   Special Requests   Final    ADDED 2053 Performed at [facility], [address], [place], [place] [address]    Culture >=100,000 COLONIES/mL PROTEUS MIRABILIS (A)  Final   Report Status 02/16/2022 FINAL  Final   Organism ID, Bacteria PROTEUS MIRABILIS (A)  Final      Susceptibility   Proteus mirabilis - MIC*    AMPICILLIN >=32 RESISTANT Resistant     CEFAZOLIN <=4 SENSITIVE Sensitive     CEFEPIME <=0.12 SENSITIVE Sensitive     CEFTRIAXONE <=0.25 SENSITIVE Sensitive     CIPROFLOXACIN >=4 RESISTANT Resistant     GENTAMICIN <=1 SENSITIVE Sensitive     IMIPENEM 2 SENSITIVE Sensitive     NITROFURANTOIN 128 RESISTANT Resistant     TRIMETH/SULFA >=320 RESISTANT Resistant     AMPICILLIN/SULBACTAM <=2 SENSITIVE Sensitive     PIP/TAZO <=4 SENSITIVE Sensitive     * >=100,000 COLONIES/mL PROTEUS MIRABILIS   80 YOM with history of suprapubic catheter with neurogenic bladder presented with abdominal pain. PCP saw pt and sent them to ER. No significant flank tenderness reported. Abdominal tenderness at left thoracic region and left upper quadrant. Foley was in place. UA showed no WBC, small leukocytes, no bacteria. CT pelvis showed no acute findings. Patient returned to ED on 9/10 for foley not draining well. Patient was to have it changed 9/11. Last change was about 3 weeks ago and was now leaking urine. Foley was replaced. Did not suspect UTI. Discussed with Dr. Wyvonnia Dusky. Asymptomatic bacteriuria suspected.   New antibiotic prescription:  none   ED Provider: Dr. Hilbert Odor, PharmD PGY1 Pharmacy Resident   02/17/2022  9:04 AM  Clinical Pharmacist Monday - Friday phone -  [phone] Saturday - Sunday phone - [phone]

## 2022-02-17 NOTE — Telephone Encounter (Signed)
I am unsure what medications patient is requesting. There are several medications that are filled by a different provider. Additionally, patient no-showed his appt last month and hasn't been seen since May of this year. Will send month supplies of those medications that have been previously prescribed by his PCP. I am routing this information to Bradley so she is aware.

## 2022-02-17 NOTE — Telephone Encounter (Signed)
Medication Refill - Medication: Pt's granddaughter says she called requesting this refill last Friday. No feedback today.   Pt's granddaughter says she needs all of his medication sent to the pharmacy below. Does not know the names but she says the pharmacy has submitted requests already since 2 days ago.   Has the patient contacted their pharmacy? Yes.   (Agent: If no, request that the patient contact the pharmacy for the refill. If patient does not wish to contact the pharmacy document the reason why and proceed with request.) (Agent: If yes, when and what did the pharmacy advise?)  Preferred Pharmacy (with phone number or street name):  [facility] [phone] - [address], [place] [address]  [place] [address]  Phone: [phone] Fax: [phone]   Has the patient been seen for an appointment in the last year OR does the patient have an upcoming appointment? Yes.    Agent: Please be advised that RX refills may take up to 3 business days. We ask that you follow-up with your pharmacy.

## 2022-02-18 NOTE — Telephone Encounter (Signed)
Called patient's granddaughter, Shiela Mayer on [place]. Advised her medications were sent to pharmacy for patient. She verb understanding.

## 2022-02-19 ENCOUNTER — Telehealth: Payer: Self-pay | Admitting: Emergency Medicine

## 2022-02-19 ENCOUNTER — Other Ambulatory Visit: Payer: Self-pay | Admitting: Nurse Practitioner

## 2022-02-19 ENCOUNTER — Other Ambulatory Visit: Payer: Self-pay

## 2022-02-19 DIAGNOSIS — Z9359 Other cystostomy status: Secondary | ICD-10-CM

## 2022-02-19 DIAGNOSIS — R4189 Other symptoms and signs involving cognitive functions and awareness: Secondary | ICD-10-CM

## 2022-02-19 DIAGNOSIS — I6930 Unspecified sequelae of cerebral infarction: Secondary | ICD-10-CM

## 2022-02-19 MED ORDER — MISC. DEVICES MISC: 99 refills | Status: AC

## 2022-02-19 MED ORDER — MISC. DEVICES MISC: 99 refills | Status: DC

## 2022-02-19 NOTE — Telephone Encounter (Signed)
Pt has been called and given a virtual appointment with PCP for office notes. Order will be faxed once visit is complete.

## 2022-02-19 NOTE — Telephone Encounter (Signed)
Order placed under miscellaneous medications. Please fax to [employer]

## 2022-02-19 NOTE — Telephone Encounter (Signed)
Copied from [place] [id number]. Topic: General - Other >> Feb 19, 2022  9:47 AM FHSVEXOG J wrote: Reason for CRM: [employer] is calling in to follow up on order form that was emailed to Doloris Hall for this patient.   Please advise of status: Phone: [phone] ref: pt's name and DOB

## 2022-02-19 NOTE — Telephone Encounter (Signed)
Will fax upon signature.

## 2022-02-23 ENCOUNTER — Encounter: Payer: Self-pay | Admitting: Nurse Practitioner

## 2022-02-23 ENCOUNTER — Ambulatory Visit (HOSPITAL_BASED_OUTPATIENT_CLINIC_OR_DEPARTMENT_OTHER): Payer: Medicare Other | Admitting: Nurse Practitioner

## 2022-02-23 DIAGNOSIS — N3945 Continuous leakage: Secondary | ICD-10-CM

## 2022-02-23 DIAGNOSIS — R159 Full incontinence of feces: Secondary | ICD-10-CM | POA: Diagnosis not present

## 2022-02-23 DIAGNOSIS — I6930 Unspecified sequelae of cerebral infarction: Secondary | ICD-10-CM

## 2022-02-23 NOTE — Progress Notes (Signed)
Virtual Visit Note  I discussed the limitations, risks, security and privacy concerns of performing an evaluation and management service by video and the availability of in person appointments. I also discussed with the patient that there may be a patient responsible charge related to this service. The patient expressed understanding and agreed to proceed.    I connected with Todd Dunn on 02/23/22  at   8:10 AM EDT  EDT by VIDEO and verified that I am speaking with the correct person using two identifiers.   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  He has bowel incontinence as a residual from his stroke and sometimes urinary incontinence with urine leaking from his penis despite placement of a suprapubic catheter. Also due to his R BKA he is sometimes unable to make it to the bathroom in time with his wheelchair. He is not fully mobile. He is also unable to fully express when he needs to go to the bathroom and this results in soiling of clothing, bedding/linens.  Past Medical History:  Diagnosis Date   BPH (benign prostatic hyperplasia)    CAD (coronary artery disease)    Chronic indwelling Foley catheter    CVA (cerebral vascular accident) ([place])    GERD (gastroesophageal reflux disease)    HTN (hypertension)    Neurogenic bladder    SSS (sick sinus syndrome) ([place])    s/p MDT ICD   Vitamin D deficiency      Past Surgical History:  Procedure Laterality Date   ABDOMINAL AORTOGRAM W/LOWER EXTREMITY N/A 03/23/2021   Procedure: ABDOMINAL AORTOGRAM W/LOWER EXTREMITY;  Surgeon: Waynetta Sandy, MD;  Location: [facility];  Service: Cardiovascular;  Laterality: N/A;   AMPUTATION Right 04/01/2021   Procedure: RIGHT GREAT TOE AMPUTATION;  Surgeon: Newt Minion, MD;  Location: [facility];  Service: Orthopedics;  Laterality: Right;   AMPUTATION Right 07/30/2021   Procedure: AMPUTATION ABOVE KNEE;  Surgeon: Broadus John, MD;  Location: [facility];  Service: Vascular;  Laterality: Right;   APPENDECTOMY     APPLICATION OF WOUND VAC Right 04/01/2021   Procedure: APPLICATION OF WOUND VAC;  Surgeon: Newt Minion, MD;  Location: [facility];  Service: Orthopedics;  Laterality: Right;   CARDIAC SURGERY     FEMORAL-TIBIAL BYPASS GRAFT Right 03/26/2021   Procedure: BYPASS GRAFT FEMORAL-TIBIAL ARTERY.  Harvest right leg saphenous vein, right vein patch angioplasty posterior tibial.;  Surgeon: Marty Heck, MD;  Location: [facility];  Service: Vascular;  Laterality: Right;   IR US GUIDE BX ASP/DRAIN  08/03/2021   IR US GUIDE BX ASP/DRAIN  08/06/2021   PACEMAKER PLACEMENT      Family History  Family history unknown: Yes    Social History   Socioeconomic History   Marital status: Widowed    Spouse name: Not on file   Number of children: Not on file   Years of education: Not on file   Highest education level: Not on file  Occupational History  Not on file  Tobacco Use   Smoking status: Former    Types: Cigarettes    Passive exposure: Never   Smokeless tobacco: Never  Substance and Sexual Activity   Alcohol use: Not Currently   Drug use: Never   Sexual activity: Not on file  Other Topics Concern   Not on file  Social History Narrative   ** Merged History Encounter **       Social Determinants of Health   Financial Resource Strain: Not on file  Food Insecurity: Not on file   Transportation Needs: Not on file  Physical Activity: Not on file  Stress: Not on file  Social Connections: Not on file     Observations/Objective: Awake, alert and oriented x 3   ROS  Assessment and Plan: Diagnoses and all orders for this visit:  History of stroke with current residual effects  Full incontinence of feces Continuous leakage of urine Incontinence supplies ordered and faxed to DME company Please provide patient with diapers, wipes, medium gloves and incontinence pads (chucks) z93.59 R41.89 I69.30     Follow Up Instructions Return in about 3 months (around 05/25/2022).     I discussed the assessment and treatment plan with the patient. The patient was provided an opportunity to ask questions and all were answered. The patient agreed with the plan and demonstrated an understanding of the instructions.   The patient was advised to call back or seek an in-person evaluation if the symptoms worsen or if the condition fails to improve as anticipated.  I provided 13 minutes of face-to-face time during this encounter including median intraservice time, reviewing previous notes, labs, imaging, medications and explaining diagnosis and management.  [name], FNP-BC

## 2022-02-24 NOTE — Telephone Encounter (Signed)
Todd Dunn with [employer] calling back in to follow up. Advised per CMA message below. She will be looking out for form once pt has ov.

## 2022-02-24 NOTE — Telephone Encounter (Signed)
Orders signed and faxed.

## 2022-02-24 NOTE — Telephone Encounter (Signed)
Did we send to [employer].

## 2022-02-26 ENCOUNTER — Emergency Department (HOSPITAL_COMMUNITY)
Admission: EM | Admit: 2022-02-26 | Discharge: 2022-02-26 | Disposition: A | Discharge status: Home / Self Care | Payer: Medicare Other | Attending: Emergency Medicine | Admitting: Emergency Medicine

## 2022-02-26 ENCOUNTER — Other Ambulatory Visit: Payer: Self-pay

## 2022-02-26 ENCOUNTER — Emergency Department (HOSPITAL_COMMUNITY): Payer: Medicare Other

## 2022-02-26 DIAGNOSIS — I10 Essential (primary) hypertension: Secondary | ICD-10-CM | POA: Insufficient documentation

## 2022-02-26 DIAGNOSIS — Z79899 Other long term (current) drug therapy: Secondary | ICD-10-CM | POA: Diagnosis not present

## 2022-02-26 DIAGNOSIS — Z95810 Presence of automatic (implantable) cardiac defibrillator: Secondary | ICD-10-CM | POA: Diagnosis not present

## 2022-02-26 DIAGNOSIS — Z87891 Personal history of nicotine dependence: Secondary | ICD-10-CM | POA: Insufficient documentation

## 2022-02-26 DIAGNOSIS — Z20822 Contact with and (suspected) exposure to covid-19: Secondary | ICD-10-CM | POA: Insufficient documentation

## 2022-02-26 DIAGNOSIS — R0602 Shortness of breath: Secondary | ICD-10-CM | POA: Diagnosis present

## 2022-02-26 DIAGNOSIS — I2510 Atherosclerotic heart disease of native coronary artery without angina pectoris: Secondary | ICD-10-CM | POA: Diagnosis not present

## 2022-02-26 LAB — COMPREHENSIVE METABOLIC PANEL
ALT: 65 U/L — ABNORMAL HIGH (ref 0–44)
AST: 41 U/L (ref 15–41)
Albumin: 3.6 g/dL (ref 3.5–5.0)
Alkaline Phosphatase: 143 U/L — ABNORMAL HIGH (ref 38–126)
Anion gap: 11 (ref 5–15)
BUN: 23 mg/dL (ref 8–23)
CO2: 22 mmol/L (ref 22–32)
Calcium: 10 mg/dL (ref 8.9–10.3)
Chloride: 106 mmol/L (ref 98–111)
Creatinine, Ser: 0.75 mg/dL (ref 0.61–1.24)
GFR, Estimated: >60 mL/min (ref >60)
Glucose, Bld: 81 mg/dL (ref 70–99)
Potassium: 3.4 mmol/L — ABNORMAL LOW (ref 3.5–5.1)
Sodium: 139 mmol/L (ref 135–145)
Total Bilirubin: 0.5 mg/dL (ref 0.3–1.2)
Total Protein: 7 g/dL (ref 6.5–8.1)

## 2022-02-26 LAB — CBC WITH DIFFERENTIAL/PLATELET
Abs Immature Granulocytes: 0.02 10*3/uL (ref 0.00–0.07)
Basophils Absolute: 0 10*3/uL (ref 0.0–0.1)
Basophils Relative: 1 %
Eosinophils Absolute: 0 10*3/uL (ref 0.0–0.5)
Eosinophils Relative: 1 %
HCT: 47.6 % (ref 39.0–52.0)
Hemoglobin: 14.8 g/dL (ref 13.0–17.0)
Immature Granulocytes: 1 %
Lymphocytes Relative: 38 %
Lymphs Abs: 1.3 10*3/uL (ref 0.7–4.0)
MCH: 28 pg (ref 26.0–34.0)
MCHC: 31.1 g/dL (ref 30.0–36.0)
MCV: 90 fL (ref 80.0–100.0)
Monocytes Absolute: 0.2 10*3/uL (ref 0.1–1.0)
Monocytes Relative: 7 %
Neutro Abs: 1.8 10*3/uL (ref 1.7–7.7)
Neutrophils Relative %: 52 %
Platelets: 94 10*3/uL — ABNORMAL LOW (ref 150–400)
RBC: 5.29 MIL/uL (ref 4.22–5.81)
RDW: 15.5 % (ref 11.5–15.5)
WBC: 3.3 10*3/uL — ABNORMAL LOW (ref 4.0–10.5)
nRBC: 0 % (ref 0.0–0.2)

## 2022-02-26 LAB — SARS CORONAVIRUS 2 BY RT PCR: SARS Coronavirus 2 by RT PCR: NEGATIVE

## 2022-02-26 NOTE — ED Provider Notes (Signed)
[facility] EMERGENCY DEPARTMENT Provider Note   CSN: [id number] Arrival date & time: 02/26/22  1529     History  Chief Complaint  Patient presents with   Shortness of Breath    Todd Dunn is a 81 y.o. male.  Patient brought in by EMS family called complaining the patient here for shortness of breath.  Oxygen sats upon arrival here have been 96% or better.  It was initially reported 82%.  Patient was given albuterol by EMS no wheezing upon arrival patient is normally on albuterol.  Past medical history significant for neurogenic bladder patient has a suprapubic catheter in place longstanding had a CVA in the past history of coronary artery disease sick sinus syndrome has pacemaker and ICD in place.  History of hypertension and gastroesophageal reflux disease.  Patient's had an appendectomy and had right fem-tib bypass graft but that led to an amputation in October 2022.  Above the knee.  Patient is a former smoker of cigarettes.  And wears recent patient recently seen September 10 for catheter leaking and it was changed out.  Patient seen September 9 for abdominal pain and patient seen August 10 for Foley catheter malfunction problem.       Home Medications Prior to Admission medications   Medication Sig Start Date End Date Taking? Authorizing Provider  albuterol (VENTOLIN HFA) 108 (90 Base) MCG/ACT inhaler Inhale 2 puffs into the lungs every 6 (six) hours as needed for wheezing or shortness of breath. 02/17/22   Charlott Rakes, MD  ascorbic acid (VITAMIN C) 500 MG tablet Take 1 tablet (500 mg total) by mouth 2 (two) times daily. 02/17/22   Charlott Rakes, MD  clopidogrel (PLAVIX) 75 MG tablet Take 1 tablet (75 mg total) by mouth at bedtime. 02/17/22   Charlott Rakes, MD  divalproex (DEPAKOTE) 125 MG DR tablet Take 1 tablet (125 mg total) by mouth at bedtime. 02/17/22   Charlott Rakes, MD  feeding supplement (ENSURE ENLIVE / ENSURE PLUS) LIQD Take 237 mLs by  mouth 3 (three) times daily between meals. 10/03/21   Bonnielee Haff, MD  hydrALAZINE (APRESOLINE) 10 MG tablet Take 1 tablet (10 mg total) by mouth every 8 (eight) hours. 10/19/21   Mercy Riding, MD  isosorbide mononitrate (IMDUR) 30 MG 24 hr tablet Take 0.5 tablets (15 mg total) by mouth daily. 10/19/21   Mercy Riding, MD  Misc. Devices MISC Please provide patient with diapers, wipes, medium gloves and incontinence pads (chucks) z93.59 R41.89 I69.30    02/19/22   [name], NP  nitroGLYCERIN (NITROSTAT) 0.4 MG SL tablet Place 0.4 mg under the tongue every 5 (five) minutes x 3 doses as needed for chest pain.    [provider]  oxybutynin (DITROPAN-XL) 5 MG 24 hr tablet Take 1 tablet (5 mg total) by mouth daily. 02/17/22   Charlott Rakes, MD  pantoprazole (PROTONIX) 40 MG tablet TAKE 1 TABLET(40 MG) BY MOUTH DAILY 02/17/22   Charlott Rakes, MD  polyethylene glycol (MIRALAX / GLYCOLAX) 17 g packet Take 17 g by mouth daily as needed for mild constipation. 02/17/22   Charlott Rakes, MD  potassium chloride SA (KLOR-CON M) 20 MEQ tablet Take 1 tablet (20 mEq total) by mouth daily for 3 days. 02/13/22 02/16/22  Jeanell Sparrow, DO  QUEtiapine (SEROQUEL) 50 MG tablet Take 1 tablet (50 mg total) by mouth at bedtime. 12/06/21   [name], NP  senna-docusate (SENOKOT-S) 8.6-50 MG tablet TAKE 2 TABLETS BY  MOUTH TWICE DAILY 01/13/22   [name], NP  vitamin B-12 (CYANOCOBALAMIN) 1000 MCG tablet Take 1 tablet (1,000 mcg total) by mouth daily. 07/17/21   [name], NP  Vitamin D, Ergocalciferol, (DRISDOL) 1.25 MG (50000 UNIT) CAPS capsule Take 1 capsule (50,000 Units total) by mouth every Monday. 11/23/21   [name], NP  zinc sulfate 220 (50 Zn) MG capsule Take 1 capsule (220 mg total) by mouth daily. 10/19/21   Mercy Riding, MD      Allergies    Food, Aspirin, Crestor [rosuvastatin calcium], [place] clam (m. mercenaria) skin test, and Penicillins    Review of  Systems   Review of Systems  Unable to perform ROS: Dementia    Physical Exam Updated Vital Signs BP 130/87 (BP Location: Left Arm)   Pulse (!) 110   Temp (!) 97.5 F (36.4 C) (Oral)   Resp (!) 23   SpO2 97%  Physical Exam Vitals and nursing note reviewed.  Constitutional:      General: He is not in acute distress.    Appearance: He is well-developed.  HENT:     Head: Normocephalic and atraumatic.  Eyes:     Conjunctiva/sclera: Conjunctivae normal.  Cardiovascular:     Rate and Rhythm: Normal rate and regular rhythm.     Heart sounds: No murmur heard. Pulmonary:     Effort: Pulmonary effort is normal. No respiratory distress.     Breath sounds: Normal breath sounds.  Abdominal:     Palpations: Abdomen is soft.     Tenderness: There is no abdominal tenderness.     Comments: Suprapubic catheter in place.  Musculoskeletal:        General: No swelling.     Cervical back: Neck supple.     Comments: Above-the-knee amputation to right lower extremity.  Skin:    General: Skin is warm and dry.     Capillary Refill: Capillary refill takes less than 2 seconds.  Neurological:     General: No focal deficit present.     Mental Status: He is alert. Mental status is at baseline.  Psychiatric:        Mood and Affect: Mood normal.     ED Results / Procedures / Treatments   Labs (all labs ordered are listed, but only abnormal results are displayed) Labs Reviewed  COMPREHENSIVE METABOLIC PANEL - Abnormal; Notable for the following components:      Result Value   Potassium 3.4 (*)    ALT 65 (*)    Alkaline Phosphatase 143 (*)    All other components within normal limits  CBC WITH DIFFERENTIAL/PLATELET - Abnormal; Notable for the following components:   WBC 3.3 (*)    Platelets 94 (*)    All other components within normal limits  SARS CORONAVIRUS 2 BY RT PCR    EKG EKG Interpretation  Date/Time:  Friday February 26 2022 17:54:18 EDT Ventricular Rate:  87 PR  Interval:  162 QRS Duration: 128 QT Interval:  425 QTC Calculation: 512 R Axis:   53 Text Interpretation: Sinus rhythm Multiform ventricular premature complexes LVH with IVCD and secondary repol abnrm ST depr, consider ischemia, inferior leads Anterior ST elevation, probably due to LVH Prolonged QT interval Artifact in lead(s) I II aVR aVL aVF No significant change since last tracing Confirmed by Fredia Sorrow [phone]) on 02/26/2022 5:58:43 PM  Radiology DG Chest Port 1 View  Result Date: 02/26/2022 CLINICAL DATA:  Shortness of breath. EXAM: PORTABLE  CHEST 1 VIEW COMPARISON:  Chest x-ray 11/28/2021 FINDINGS: Sternotomy wires, mediastinal clips and left-sided pacemaker again seen. The heart is enlarged, unchanged. There is some minimal hazy and patchy opacities in the lung bases. Costophrenic angles are clear. No evidence for pneumothorax or acute fracture. IMPRESSION: 1. Minimal bibasilar atelectasis/airspace disease. 2. Stable cardiomegaly. Electronically Signed   By: Ronney Asters M.D.   On: 02/26/2022 17:09    Procedures Procedures    Medications Ordered in ED Medications - No data to display  ED Course/ Medical Decision Making/ A&P                           Medical Decision Making Amount and/or Complexity of Data Reviewed Labs: ordered. Radiology: ordered.   Patient work-up here without any acute findings.  Oxygen saturations 96% on room air.  Patient's mental status reported to be baseline.  EMS had given him albuterol inhaler he never heard any wheezing here he has albuterol inhaler to continue at home.  Reportedly upon arrival oxygen sats were 82% but everything since then has been 96% or better.  Not hypotensive.  Not tachycardic.  Patient has a pacemaker in place but EKG does show some PVCs does not show evidence of a paced rhythm.  Patient had-is a suprapubic catheter in place long-term patient did not have any fevers here.  Work-up negative potassium to slightly down at 3.4  liver function test alk phos up a little bit at 143 no abdominal tenderness or discomfort bilirubin is normal renal function normal.  Blood sugar normal CBC white count 3.3 has had low bit of low white blood cell counts in the past and no significant change there platelets down a little bit at 94 does have primary care to follow-up with.  COVID testing negative chest x-ray negative for any acute findings.  I did not check urine Nausea longstanding suprapubic catheter most likely will be contaminated.  Patient without any evidence of infectious process here today.  Patient stable for discharge home. Final Clinical Impression(s) / ED Diagnoses Final diagnoses:  SOB (shortness of breath)    Rx / DC Orders ED Discharge Orders     None         Fredia Sorrow, MD 02/26/22 1947

## 2022-02-26 NOTE — ED Triage Notes (Signed)
Pt bib [facility] with family complaining that the pt is SOB. His baseline is slurred speech but his slurred speech is more "wet" per family. Ems administered one NEB tx of albuterol.

## 2022-02-26 NOTE — Discharge Instructions (Addendum)
Work-up in the emergency department no hypotension oxygen sats have been 96% on room air.  Chest x-ray negative.  No evidence of any wheezing.  Continue albuterol inhaler that was ordered on September 13.  Make an appointment to follow-up with his doctors.  Return for any new or worse symptoms.

## 2022-02-28 ENCOUNTER — Encounter (HOSPITAL_COMMUNITY): Payer: Self-pay | Admitting: Emergency Medicine

## 2022-02-28 ENCOUNTER — Emergency Department (HOSPITAL_COMMUNITY)
Admission: EM | Admit: 2022-02-28 | Discharge: 2022-03-01 | Disposition: A | Discharge status: Home / Self Care | Payer: Medicare Other | Attending: Emergency Medicine | Admitting: Emergency Medicine

## 2022-02-28 ENCOUNTER — Other Ambulatory Visit: Payer: Self-pay

## 2022-02-28 DIAGNOSIS — T83091A Other mechanical complication of indwelling urethral catheter, initial encounter: Secondary | ICD-10-CM | POA: Insufficient documentation

## 2022-02-28 DIAGNOSIS — Z7902 Long term (current) use of antithrombotics/antiplatelets: Secondary | ICD-10-CM | POA: Insufficient documentation

## 2022-02-28 DIAGNOSIS — T83010A Breakdown (mechanical) of cystostomy catheter, initial encounter: Secondary | ICD-10-CM

## 2022-02-28 DIAGNOSIS — R451 Restlessness and agitation: Secondary | ICD-10-CM | POA: Diagnosis not present

## 2022-02-28 NOTE — ED Triage Notes (Signed)
Patient from home accompanied by great grandson. They noticed today that patient's foley catheter is not draining and noticed patient is urinating around the foley. Patient NAD at this time.

## 2022-03-01 DIAGNOSIS — T83091A Other mechanical complication of indwelling urethral catheter, initial encounter: Secondary | ICD-10-CM | POA: Diagnosis not present

## 2022-03-01 LAB — CBC WITH DIFFERENTIAL/PLATELET
Abs Immature Granulocytes: 0.01 10*3/uL (ref 0.00–0.07)
Basophils Absolute: 0 10*3/uL (ref 0.0–0.1)
Basophils Relative: 1 %
Eosinophils Absolute: 0.1 10*3/uL (ref 0.0–0.5)
Eosinophils Relative: 1 %
HCT: 42.9 % (ref 39.0–52.0)
Hemoglobin: 13.6 g/dL (ref 13.0–17.0)
Immature Granulocytes: 0 %
Lymphocytes Relative: 25 %
Lymphs Abs: 1.2 10*3/uL (ref 0.7–4.0)
MCH: 28.3 pg (ref 26.0–34.0)
MCHC: 31.7 g/dL (ref 30.0–36.0)
MCV: 89.4 fL (ref 80.0–100.0)
Monocytes Absolute: 0.5 10*3/uL (ref 0.1–1.0)
Monocytes Relative: 10 %
Neutro Abs: 3.1 10*3/uL (ref 1.7–7.7)
Neutrophils Relative %: 63 %
Platelets: 174 10*3/uL (ref 150–400)
RBC: 4.8 MIL/uL (ref 4.22–5.81)
RDW: 15.6 % — ABNORMAL HIGH (ref 11.5–15.5)
WBC: 4.9 10*3/uL (ref 4.0–10.5)
nRBC: 0 % (ref 0.0–0.2)

## 2022-03-01 LAB — URINALYSIS, ROUTINE W REFLEX MICROSCOPIC
RBC / HPF: >50 RBC/hpf — ABNORMAL HIGH (ref 0–5)
WBC, UA: >50 WBC/hpf — ABNORMAL HIGH (ref 0–5)

## 2022-03-01 LAB — URINALYSIS, MICROSCOPIC (REFLEX)
RBC / HPF: >50 RBC/hpf (ref 0–5)
WBC, UA: >50 WBC/hpf (ref 0–5)

## 2022-03-01 LAB — BASIC METABOLIC PANEL
Anion gap: 8 (ref 5–15)
BUN: 19 mg/dL (ref 8–23)
CO2: 27 mmol/L (ref 22–32)
Calcium: 9.7 mg/dL (ref 8.9–10.3)
Chloride: 108 mmol/L (ref 98–111)
Creatinine, Ser: 0.83 mg/dL (ref 0.61–1.24)
GFR, Estimated: >60 mL/min (ref >60)
Glucose, Bld: 130 mg/dL — ABNORMAL HIGH (ref 70–99)
Potassium: 3.5 mmol/L (ref 3.5–5.1)
Sodium: 143 mmol/L (ref 135–145)

## 2022-03-01 MED ORDER — OXYCODONE-ACETAMINOPHEN 5-325 MG PO TABS: 2.0000 | ORAL_TABLET | ORAL | 0 refills | Status: AC | PRN

## 2022-03-01 MED ORDER — CEPHALEXIN 500 MG PO CAPS: 500.0000 mg | ORAL_CAPSULE | Freq: Four times a day (QID) | ORAL | 0 refills | Status: AC

## 2022-03-01 MED ORDER — OXYCODONE-ACETAMINOPHEN 5-325 MG PO TABS
1.0000 | ORAL_TABLET | Freq: Once | ORAL | Status: AC
  Administered 2022-03-01: 1 via ORAL
  Filled 2022-03-01: qty 1

## 2022-03-01 MED ORDER — SODIUM CHLORIDE 0.9 % IV SOLN
2.0000 g | Freq: Once | INTRAVENOUS | Status: AC
  Administered 2022-03-01: 2 g via INTRAVENOUS
  Filled 2022-03-01: qty 20

## 2022-03-01 MED ORDER — CEPHALEXIN 250 MG PO CAPS: 1000.0000 mg | ORAL_CAPSULE | Freq: Once | ORAL | Status: DC

## 2022-03-01 NOTE — ED Provider Notes (Signed)
[facility] EMERGENCY DEPARTMENT Provider Note   CSN: [id number] Arrival date & time: 02/28/22  1753     History  Chief Complaint  Patient presents with   foley catheter blockage?     Todd Dunn is a 81 y.o. male.  Patient has a suprapubic catheter in place and it has not been draining recently.  He has urine leaking around the catheter and out of his penis.  His grandson helps take care of him and states that seems like he is in a lot of pain as he is little bit more agitated than normal.  He does have outbursts at baseline but this seems to have worsened recently and thus they brought him here for further evaluation.  No fevers, nausea, vomiting or other associated symptoms.  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        Home Medications Prior to Admission medications   Medication Sig Start Date End Date Taking? Authorizing Provider  cephALEXin (KEFLEX) 500 MG capsule Take 1 capsule (500 mg total) by mouth 4 (four) times daily. 03/01/22  Yes Cleona Doubleday, Corene Cornea, MD  oxyCODONE-acetaminophen (PERCOCET) 5-325 MG tablet Take 2 tablets by mouth every 4 (four) hours as needed. 03/01/22  Yes Roxi Hlavaty, Corene Cornea, MD  albuterol (VENTOLIN HFA) 108 (90 Base) MCG/ACT inhaler Inhale 2 puffs into the lungs every 6 (six) hours as needed for wheezing or shortness of breath. 02/17/22   Charlott Rakes, MD  ascorbic acid (VITAMIN C) 500 MG tablet Take 1 tablet (500 mg total) by mouth 2 (two) times daily. 02/17/22   Charlott Rakes, MD  clopidogrel (PLAVIX) 75 MG tablet Take 1 tablet (75 mg total) by mouth at bedtime. 02/17/22   Charlott Rakes, MD  divalproex (DEPAKOTE) 125 MG DR tablet Take 1 tablet (125 mg total) by mouth at bedtime. 02/17/22   Charlott Rakes, MD  feeding supplement (ENSURE ENLIVE / ENSURE PLUS) LIQD Take 237 mLs by mouth 3 (three) times daily between meals. 10/03/21   Bonnielee Haff, MD  hydrALAZINE (APRESOLINE) 10 MG tablet Take 1 tablet (10 mg total) by mouth every 8 (eight)  hours. 10/19/21   Mercy Riding, MD  isosorbide mononitrate (IMDUR) 30 MG 24 hr tablet Take 0.5 tablets (15 mg total) by mouth daily. 10/19/21   Mercy Riding, MD  Misc. Devices MISC Please provide patient with diapers, wipes, medium gloves and incontinence pads (chucks) z93.59 R41.89 I69.30    02/19/22   [name], NP  nitroGLYCERIN (NITROSTAT) 0.4 MG SL tablet Place 0.4 mg under the tongue every 5 (five) minutes x 3 doses as needed for chest pain.    [provider]  oxybutynin (DITROPAN-XL) 5 MG 24 hr tablet Take 1 tablet (5 mg total) by mouth daily. 02/17/22   Charlott Rakes, MD  pantoprazole (PROTONIX) 40 MG tablet TAKE 1 TABLET(40 MG) BY MOUTH DAILY 02/17/22   Charlott Rakes, MD  polyethylene glycol (MIRALAX / GLYCOLAX) 17 g packet Take 17 g by mouth daily as needed for mild constipation. 02/17/22   Charlott Rakes, MD  potassium chloride SA (KLOR-CON M) 20 MEQ tablet Take 1 tablet (20 mEq total) by mouth daily for 3 days. 02/13/22 02/16/22  Jeanell Sparrow, DO  QUEtiapine (SEROQUEL) 50 MG tablet Take 1 tablet (50 mg total) by mouth at bedtime. 12/06/21   [name], NP  senna-docusate (SENOKOT-S) 8.6-50 MG tablet TAKE 2 TABLETS BY MOUTH TWICE DAILY 01/13/22   [name], NP  vitamin B-12 (CYANOCOBALAMIN) 1000  MCG tablet Take 1 tablet (1,000 mcg total) by mouth daily. 07/17/21   [name], NP  Vitamin D, Ergocalciferol, (DRISDOL) 1.25 MG (50000 UNIT) CAPS capsule Take 1 capsule (50,000 Units total) by mouth every Monday. 11/23/21   [name], NP  zinc sulfate 220 (50 Zn) MG capsule Take 1 capsule (220 mg total) by mouth daily. 10/19/21   Mercy Riding, MD      Allergies    Food, Aspirin, Crestor [rosuvastatin calcium], [place] clam (m. mercenaria) skin test, and Penicillins    Review of Systems   Review of Systems  Physical Exam Updated Vital Signs BP (!) 130/112   Pulse 90   Temp 98.7 F (37.1 C) (Oral)   Resp (!) 21   SpO2 96%  Physical  Exam Vitals and nursing note reviewed.  Constitutional:      Appearance: He is well-developed.  HENT:     Head: Normocephalic and atraumatic.     Mouth/Throat:     Mouth: Mucous membranes are dry.  Eyes:     Pupils: Pupils are equal, round, and reactive to light.  Cardiovascular:     Rate and Rhythm: Normal rate.  Pulmonary:     Effort: Pulmonary effort is normal. No respiratory distress.  Abdominal:     General: Abdomen is flat. There is no distension.  Musculoskeletal:        General: Deformity (R AKA) present. Normal range of motion.     Cervical back: Normal range of motion.  Skin:    General: Skin is warm and dry.  Neurological:     General: No focal deficit present.     Mental Status: He is alert.     ED Results / Procedures / Treatments   Labs (all labs ordered are listed, but only abnormal results are displayed) Labs Reviewed  CBC WITH DIFFERENTIAL/PLATELET - Abnormal; Notable for the following components:      Result Value   RDW 15.6 (*)    All other components within normal limits  BASIC METABOLIC PANEL - Abnormal; Notable for the following components:   Glucose, Bld 130 (*)    All other components within normal limits  URINALYSIS, ROUTINE W REFLEX MICROSCOPIC - Abnormal; Notable for the following components:   APPearance TURBID (*)    Glucose, UA RESULTS UNAVAILABLE DUE TO INTERFERING SUBSTANCE (*)    Hgb urine dipstick RESULTS UNAVAILABLE DUE TO INTERFERING SUBSTANCE (*)    Bilirubin Urine RESULTS UNAVAILABLE DUE TO INTERFERING SUBSTANCE (*)    Ketones, ur RESULTS UNAVAILABLE DUE TO INTERFERING SUBSTANCE (*)    Protein, ur RESULTS UNAVAILABLE DUE TO INTERFERING SUBSTANCE (*)    Nitrite RESULTS UNAVAILABLE DUE TO INTERFERING SUBSTANCE (*)    Leukocytes,Ua RESULTS UNAVAILABLE DUE TO INTERFERING SUBSTANCE (*)    RBC / HPF >50 (*)    WBC, UA >50 (*)    Bacteria, UA MANY (*)    All other components within normal limits  URINALYSIS, MICROSCOPIC (REFLEX) -  Abnormal; Notable for the following components:   Bacteria, UA MANY (*)    All other components within normal limits  URINE CULTURE    EKG None  Radiology No results found.  Procedures Procedures    Medications Ordered in ED Medications  oxyCODONE-acetaminophen (PERCOCET/ROXICET) 5-325 MG per tablet 1 tablet (1 tablet Oral Given 03/01/22 2542)  cefTRIAXone (ROCEPHIN) 2 g in sodium chloride 0.9 % 100 mL IVPB (0 g Intravenous Stopped 03/01/22 7062)    ED Course/ Medical Decision Making/  A&P                           Medical Decision Making Amount and/or Complexity of Data Reviewed Labs: ordered.  Risk Prescription drug management.   Suprapubic catheter was flushed by myself with good return afterwards.  Was watched and drained appropriately for multiple hours.  CBC BMP did not show any evidence of a postobstructive uropathy.  His urine was very cloudy and malodorous.  The urinalysis was not able to be fully evaluated secondary to this but was full of bacteria.  Rocephin given here and a course of Keflex at home.  Short course of pain medication as well for pain related to the catheter site and the UTI.  Final Clinical Impression(s) / ED Diagnoses Final diagnoses:  Suprapubic catheter dysfunction, initial encounter [facility])    Rx / DC Orders ED Discharge Orders          Ordered    cephALEXin (KEFLEX) 500 MG capsule  4 times daily        03/01/22 0650    oxyCODONE-acetaminophen (PERCOCET) 5-325 MG tablet  Every 4 hours PRN        03/01/22 0650              Lasheika Ortloff, Corene Cornea, MD 03/01/22 [id number]

## 2022-03-05 ENCOUNTER — Ambulatory Visit: Payer: Self-pay

## 2022-03-05 ENCOUNTER — Other Ambulatory Visit: Payer: Self-pay

## 2022-03-05 LAB — URINE CULTURE: Culture: >=100000 — AB

## 2022-03-05 MED ORDER — SENNOSIDES-DOCUSATE SODIUM 8.6-50 MG PO TABS: 2.0000 | ORAL_TABLET | Freq: Two times a day (BID) | ORAL | 0 refills | Status: AC

## 2022-03-05 MED ORDER — HYDRALAZINE HCL 10 MG PO TABS: 10.0000 mg | ORAL_TABLET | Freq: Three times a day (TID) | ORAL | 1 refills | Status: AC

## 2022-03-05 MED ORDER — ZINC SULFATE 220 (50 ZN) MG PO CAPS: 220.0000 mg | ORAL_CAPSULE | Freq: Every day | ORAL | 1 refills | Status: AC

## 2022-03-05 MED ORDER — ISOSORBIDE MONONITRATE ER 30 MG PO TB24: 15.0000 mg | ORAL_TABLET | Freq: Every day | ORAL | 1 refills | Status: AC

## 2022-03-05 NOTE — Telephone Encounter (Signed)
Requested medication (s) are due for refill today: yes  Requested medication (s) are on the active medication list: yes  Last refill:  10/19/21  Future visit scheduled:yes  Notes to clinic:  Unable to refill per protocol, last refill by another provider.  Routing for review     Requested Prescriptions  Pending Prescriptions Disp Refills   zinc sulfate 220 (50 Zn) MG capsule 30 capsule 1    Sig: Take 1 capsule (220 mg total) by mouth daily.     Endocrinology:  Minerals Passed - 03/05/2022  2:19 PM      Passed - Valid encounter within last 12 months    Recent Outpatient Visits           1 week ago History of stroke with current residual effects   [place] Todd Pounds, Todd Dunn   4 months ago Hospital discharge follow-up   [place] Todd Pounds, Todd Dunn   7 months ago Encounter to establish care   [place], Todd Dunn       Future Appointments             In 2 months Todd Pounds, Todd Dunn [place]             isosorbide mononitrate (IMDUR) 30 MG 24 hr tablet 30 tablet 1    Sig: Take 0.5 tablets (15 mg total) by mouth daily.     Cardiovascular:  Nitrates Failed - 03/05/2022  2:19 PM      Failed - Last BP in normal range    BP Readings from Last 1 Encounters:  03/01/22 (!) 130/112         Passed - Last Heart Rate in normal range    Pulse Readings from Last 1 Encounters:  03/01/22 90         Passed - Valid encounter within last 12 months    Recent Outpatient Visits           1 week ago History of stroke with current residual effects   [place], Todd Buff, Todd Dunn   4 months ago Hospital discharge follow-up   [place] Todd Pounds, Todd Dunn   7 months ago Encounter to establish care   [place], Todd Buff, Todd Dunn        Future Appointments             In 2 months Todd Pounds, Todd Dunn [place]             senna-docusate (SENOKOT-S) 8.6-50 MG tablet 120 tablet 2    Sig: Take 2 tablets by mouth 2 (two) times daily.     Over the Counter:  OTC Passed - 03/05/2022  2:19 PM      Passed - Valid encounter within last 12 months    Recent Outpatient Visits           1 week ago History of stroke with current residual effects   [place] Todd Pounds, Todd Dunn   4 months ago Hospital discharge follow-up   [place], Todd Dunn   7 months ago Encounter to establish care   [place], Todd Buff, Todd Dunn       Future  Appointments             In 2 months Todd Pounds, Todd Dunn [place]             hydrALAZINE (APRESOLINE) 10 MG tablet 90 tablet 1    Sig: Take 1 tablet (10 mg total) by mouth every 8 (eight) hours.     Cardiovascular:  Vasodilators Failed - 03/05/2022  2:19 PM      Failed - ANA Screen, Ifa, Serum in normal range and within 360 days    No results found for: "ANA", "ANATITER", "LABANTI"       Failed - Last BP in normal range    BP Readings from Last 1 Encounters:  03/01/22 (!) 130/112         Passed - HCT in normal range and within 360 days    HCT  Date Value Ref Range Status  03/01/2022 42.9 39.0 - 52.0 % Final         Passed - HGB in normal range and within 360 days    Hemoglobin  Date Value Ref Range Status  03/01/2022 13.6 13.0 - 17.0 g/dL Final  07/10/2021 12.0 (L) 13.0 - 17.0 g/dL Final         Passed - RBC in normal range and within 360 days    RBC  Date Value Ref Range Status  03/01/2022 4.80 4.22 - 5.81 MIL/uL Final         Passed - WBC in normal range and within 360 days    WBC  Date Value Ref Range Status  03/01/2022 4.9 4.0 - 10.5 K/uL Final         Passed - PLT in normal range and  within 360 days    Platelets  Date Value Ref Range Status  03/01/2022 174 150 - 400 K/uL Final   Platelet Count  Date Value Ref Range Status  07/10/2021 145 (L) 150 - 400 K/uL Final         Passed - Valid encounter within last 12 months    Recent Outpatient Visits           1 week ago History of stroke with current residual effects   [place], Todd Buff, Todd Dunn   4 months ago Hospital discharge follow-up   [place], [place], Todd Dunn   7 months ago Encounter to establish care   [place], Todd Buff, Todd Dunn       Future Appointments             In 2 months Todd Pounds, Todd Dunn [place]

## 2022-03-05 NOTE — Telephone Encounter (Signed)
  Chief Complaint: Patient is not getting help at hospital Symptoms: Pain, SOB, dark smelly urine, Remaining leg is looking like other leg prior to amputation Frequency: ongoing Pertinent Negatives: Patient denies  Disposition: '[x]'$ ED /'[]'$ Urgent Care (no appt availability in office) / '[]'$ Appointment(In office/virtual)/ '[]'$  [facility]/ '[]'$ Home Care/ '[]'$ Refused Recommended Disposition /'[]'$ [facility]/ '[]'$  Follow-up with PCP Additional Notes: PT's grand daughter called regarding pt. Grandaughter is frustrated. PT has been to ED 4 times recently for: Pain, SOB,  and Dark smelly urine. Pt is not eating. Granddaughter also states that his remaining leg looks a lot like the amputated leg prior to amputation.  Per Granddaughter ED has not been helpful, and have released him.  Granddaughter wants pt to see his PCP ASAP.    Advised to take pt back to ED for above s/s. Surgeon who did amputation should be contacted for remaining leg examination.  Follow up appt made.  Pt also needs several medicines refilled.  Grand daughter Todd Dunn would like call back regarding PT.     Reason for Disposition  [1] Caller requests to speak ONLY to PCP AND [2] URGENT question  Answer Assessment - Initial Assessment Questions 1. REASON FOR CALL or QUESTION: "What is your reason for calling today?" or "How can I best help you?" or "What question do you have that I can help answer?"     Unsure how to best help pt.  Answer Assessment - Initial Assessment Questions 1. REASON FOR CALL or QUESTION: "What is your reason for calling today?" or "How can I best help you?" or "What question do you have that I can help answer?"     How to help pt. 2. CALLER: Document the source of call. (e.g., laboratory, patient).     Grand daughter  Protocols used: Information Only Call - No Triage-A-AH, PCP Call - No Triage-A-AH

## 2022-03-05 NOTE — Telephone Encounter (Signed)
Pt is also requesting Dulcolax

## 2022-03-06 ENCOUNTER — Telehealth (HOSPITAL_BASED_OUTPATIENT_CLINIC_OR_DEPARTMENT_OTHER): Payer: Self-pay | Admitting: *Deleted

## 2022-03-06 NOTE — Progress Notes (Signed)
ED Antimicrobial Stewardship Positive Culture Follow Up   Todd Dunn is an 81 y.o. male who presented to [facility] on 02/28/2022 with a chief complaint of  Chief Complaint  Patient presents with   foley catheter blockage?     Recent Results (from the past 720 hour(s))  Urine Culture     Status: Abnormal   Collection Time: 02/13/22  8:52 PM   Specimen: Urine, Catheterized  Result Value Ref Range Status   Specimen Description URINE, CATHETERIZED  Final   Special Requests   Final    ADDED 2053 Performed at [facility], [place] [address], [place], [place] [address]    Culture >=100,000 COLONIES/mL PROTEUS MIRABILIS (A)  Final   Report Status 02/16/2022 FINAL  Final   Organism ID, Bacteria PROTEUS MIRABILIS (A)  Final      Susceptibility   Proteus mirabilis - MIC*    AMPICILLIN >=32 RESISTANT Resistant     CEFAZOLIN <=4 SENSITIVE Sensitive     CEFEPIME <=0.12 SENSITIVE Sensitive     CEFTRIAXONE <=0.25 SENSITIVE Sensitive     CIPROFLOXACIN >=4 RESISTANT Resistant     GENTAMICIN <=1 SENSITIVE Sensitive     IMIPENEM 2 SENSITIVE Sensitive     NITROFURANTOIN 128 RESISTANT Resistant     TRIMETH/SULFA >=320 RESISTANT Resistant     AMPICILLIN/SULBACTAM <=2 SENSITIVE Sensitive     PIP/TAZO <=4 SENSITIVE Sensitive     * >=100,000 COLONIES/mL PROTEUS MIRABILIS  SARS Coronavirus 2 by RT PCR (hospital order, performed in [facility] hospital lab) *cepheid single result test* Anterior Nasal Swab     Status: None   Collection Time: 02/26/22  4:16 PM   Specimen: Anterior Nasal Swab  Result Value Ref Range Status   SARS Coronavirus 2 by RT PCR NEGATIVE NEGATIVE Final    Comment: (NOTE) SARS-CoV-2 target nucleic acids are NOT DETECTED.  The SARS-CoV-2 RNA is generally detectable in upper and lower respiratory specimens during the acute phase of infection. The lowest concentration of SARS-CoV-2 viral copies this assay can detect is 250 copies / mL. A negative result does not  preclude SARS-CoV-2 infection and should not be used as the sole basis for treatment or other patient management decisions.  A negative result may occur with improper specimen collection / handling, submission of specimen other than nasopharyngeal swab, presence of viral mutation(s) within the areas targeted by this assay, and inadequate number of viral copies (<250 copies / mL). A negative result must be combined with clinical observations, patient history, and epidemiological information.  Fact Sheet for Patients:   https://www.patel.info/  Fact Sheet for Healthcare Providers: https://hall.com/  This test is not yet approved or  cleared by the [place] FDA and has been authorized for detection and/or diagnosis of SARS-CoV-2 by FDA under an Emergency Use Authorization (EUA).  This EUA will remain in effect (meaning this test can be used) for the duration of the COVID-19 declaration under Section 564(b)(1) of the Act, 21 U.S.C. section 360bbb-3(b)(1), unless the authorization is terminated or revoked sooner.  Performed at [facility], [place] [address] [place], [place] [address]   Urine Culture     Status: Abnormal   Collection Time: 03/01/22  3:00 AM   Specimen: Urine, Suprapubic  Result Value Ref Range Status   Specimen Description URINE, SUPRAPUBIC  Final   Special Requests   Final    NONE Performed at [facility], [place] [address], [place], [place] [address]    Culture (  A)  Final    >=100,000 COLONIES/mL ESCHERICHIA COLI >=100,000 COLONIES/mL PROTEUS MIRABILIS Confirmed Extended Spectrum Beta-Lactamase Producer (ESBL).  In bloodstream infections from ESBL organisms, carbapenems are preferred over piperacillin/tazobactam. They are shown to have a lower risk of mortality. ESCHERICHIA COLI >=100,000 COLONIES/mL ENTEROCOCCUS FAECALIS VANCOMYCIN RESISTANT ENTEROCOCCUS    Report Status 03/05/2022 FINAL  Final   Organism  ID, Bacteria PROTEUS MIRABILIS (A)  Final   Organism ID, Bacteria ESCHERICHIA COLI (A)  Final   Organism ID, Bacteria ENTEROCOCCUS FAECALIS (A)  Final      Susceptibility   Escherichia coli - MIC*    AMPICILLIN >=32 RESISTANT Resistant     CEFAZOLIN >=64 RESISTANT Resistant     CEFEPIME 16 RESISTANT Resistant     CEFTRIAXONE >=64 RESISTANT Resistant     CIPROFLOXACIN >=4 RESISTANT Resistant     GENTAMICIN >=16 RESISTANT Resistant     IMIPENEM <=0.25 SENSITIVE Sensitive     NITROFURANTOIN <=16 SENSITIVE Sensitive     TRIMETH/SULFA >=320 RESISTANT Resistant     AMPICILLIN/SULBACTAM >=32 RESISTANT Resistant     PIP/TAZO 8 SENSITIVE Sensitive     * >=100,000 COLONIES/mL ESCHERICHIA COLI   Enterococcus faecalis - MIC*    AMPICILLIN <=2 SENSITIVE Sensitive     NITROFURANTOIN <=16 SENSITIVE Sensitive     VANCOMYCIN >=32 RESISTANT Resistant     LINEZOLID 2 SENSITIVE Sensitive     * >=100,000 COLONIES/mL ENTEROCOCCUS FAECALIS   Proteus mirabilis - MIC*    AMPICILLIN >=32 RESISTANT Resistant     CEFAZOLIN <=4 SENSITIVE Sensitive     CEFEPIME <=0.12 SENSITIVE Sensitive     CEFTRIAXONE <=0.25 SENSITIVE Sensitive     CIPROFLOXACIN >=4 RESISTANT Resistant     GENTAMICIN <=1 SENSITIVE Sensitive     IMIPENEM 1 SENSITIVE Sensitive     NITROFURANTOIN 128 RESISTANT Resistant     TRIMETH/SULFA >=320 RESISTANT Resistant     AMPICILLIN/SULBACTAM <=2 SENSITIVE Sensitive     PIP/TAZO <=4 SENSITIVE Sensitive     * >=100,000 COLONIES/mL PROTEUS MIRABILIS    '[x]'$  Treated with cephalexin, all organisms resistant to prescribed antimicrobial.   New antibiotic prescription: Fosfomycin 3g by mouth every 48 hours x 3 doses. Please STOP cephalexin. Please have patient establish care with [facility] by calling clinic at [phone] to set up appointment.   ED Provider: Evlyn Courier, PA-C  Kaleen Mask, PharmD, BCPS 03/06/2022, 12:29 PM Clinical Pharmacist Monday - Friday phone -   [phone] Saturday - Sunday phone - [phone]

## 2022-03-06 NOTE — Telephone Encounter (Signed)
Post ED Visit - Positive Culture Follow-up: Unsuccessful Patient Follow-up  Culture assessed and recommendations reviewed by:  '[x]'$  Rigoberto Noel, Pharm.D. '[]'$  Heide Guile, Pharm.D., BCPS AQ-ID '[]'$  Parks Neptune, Pharm.D., BCPS '[]'$  Alycia Rossetti, Pharm.D., BCPS '[]'$  [place], Pharm.D., BCPS, AAHIVP '[]'$  Legrand Como, Pharm.D., BCPS, AAHIVP '[]'$  Wynell Balloon, PharmD '[]'$  Vincenza Hews, PharmD, BCPS  Positive urine culture  '[]'$  Patient discharged without antimicrobial prescription and treatment is now indicated '[x]'$  Organism is resistant to prescribed ED discharge antimicrobial '[]'$  Patient with positive blood cultures  New antibiotic: Fosfomycin 3gm PO q 48 hrs x 3 doses. Stop Cephalexin. Establish care with [facility] by calling [phone] per Evlyn Courier, PA  Unable to contact patient,  letter will be sent to address on file  Rosie Fate 03/06/2022, 5:40 PM

## 2022-03-08 NOTE — Telephone Encounter (Signed)
Todd Dunn is aware to contact urology urinary issues and the pervious surgeon for leg issues. Medications were also sent into pharmacy for patient.

## 2022-03-13 ENCOUNTER — Telehealth (HOSPITAL_BASED_OUTPATIENT_CLINIC_OR_DEPARTMENT_OTHER): Payer: Self-pay | Admitting: Emergency Medicine

## 2022-03-13 NOTE — Telephone Encounter (Signed)
Post ED Visit - Positive Culture Follow-up: Successful Patient Follow-Up  Culture assessed and recommendations reviewed by:  '[]'$  Elenor Quinones, Pharm.D. '[]'$  Heide Guile, Pharm.D., BCPS AQ-ID '[]'$  Parks Neptune, Pharm.D., BCPS '[]'$  Alycia Rossetti, Pharm.D., BCPS '[]'$  [place], Pharm.D., BCPS, AAHIVP '[]'$  Legrand Como, Pharm.D., BCPS, AAHIVP '[]'$  Salome Arnt, PharmD, BCPS '[]'$  Johnnette Gourd, PharmD, BCPS '[]'$  [facility] Better, PharmD, BCPS '[]'$  Leeroy Cha, PharmD  Positive urine culture  '[]'$  Patient discharged without antimicrobial prescription and treatment is now indicated '[x]'$  Organism is resistant to prescribed ED discharge antimicrobial '[]'$  Patient with positive blood cultures  Changes discussed with ED provider: Evlyn Courier, PA-C New antibiotic prescription Fosfomycin 3 gram PO every 48 hours for three doses Called to [facility] [phone]  Contacted patient, date 03/13/22, time [place] 03/13/2022, 8:29 PM

## 2022-03-16 ENCOUNTER — Telehealth: Payer: Self-pay | Admitting: Nurse Practitioner

## 2022-03-16 NOTE — Telephone Encounter (Signed)
You can fax over notes from last visit.  PCP will need to verify when she is back in the office if she is in agreement and will be attending of record because the patient is not familiar to me.  Thanks.

## 2022-03-16 NOTE — Telephone Encounter (Signed)
Pt has been deteriorating and in a lot of discomfort. Primary diagnosis will be congestive heart failure, [facility] has been in contact with [name] (granddaughter) who has is in agreement.   Levada Dy from [facility] is requesting for the most recent progress notes (History/Physical) they are also requesting an order for hospice consultation.  Best contact: [phone] Fax: [phone]   Wants to know if PCP will remain attending? They will follow up with PCP either way.

## 2022-03-17 ENCOUNTER — Telehealth: Payer: Self-pay

## 2022-03-17 NOTE — Telephone Encounter (Signed)
Pt's granddaughter [name] called to let us know pt's LLE has periods of swelling and has developed sores over the last few weeks. She was offered an appt this afternoon but is unable to bring him, as she is at work. She has opted to have him come in for ABI's and f/u on Monday.

## 2022-03-17 NOTE — Telephone Encounter (Signed)
Office notes has been faxed over to number provided.

## 2022-03-18 ENCOUNTER — Other Ambulatory Visit: Payer: Self-pay | Admitting: *Deleted

## 2022-03-18 DIAGNOSIS — I739 Peripheral vascular disease, unspecified: Secondary | ICD-10-CM

## 2022-03-22 ENCOUNTER — Ambulatory Visit (HOSPITAL_COMMUNITY)
Admission: RE | Admit: 2022-03-22 | Discharge: 2022-03-22 | Disposition: A | Discharge status: Home / Self Care | Source: Ambulatory Visit | Attending: Surgery | Admitting: Surgery

## 2022-03-22 ENCOUNTER — Ambulatory Visit (INDEPENDENT_AMBULATORY_CARE_PROVIDER_SITE_OTHER): Payer: Medicare Other | Admitting: Physician Assistant

## 2022-03-22 VITALS — BP 117/60 | HR 56 | Temp 97.6°F | Resp 16 | Ht 73.0 in

## 2022-03-22 DIAGNOSIS — I739 Peripheral vascular disease, unspecified: Secondary | ICD-10-CM | POA: Insufficient documentation

## 2022-03-22 DIAGNOSIS — I70222 Atherosclerosis of native arteries of extremities with rest pain, left leg: Secondary | ICD-10-CM | POA: Diagnosis not present

## 2022-03-22 NOTE — H&P (View-Only) (Signed)
HISTORY AND PHYSICAL     CC:  follow up. Requesting Provider:  [name], NP  HPI: This is a 81 y.o. male who is here today for follow up for PAD.  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  This was for an occluded right common femoral to PT bypass with PTFE for CLI with toe gangrene performed on 03/26/2021 that ultimately occluded.    Pt was last seen 09/08/2021 by Dr. Carlis Abbott and at that time, he did not have any tissue loss on the left leg.  He was to f/u in 6 months with ABI for ongoing surveillance of the left leg.    The pt returns today for follow up.  He is here today with his grandson/caretaker.  His granddaughter [name] is also caretaker.  They tell me that he has a wound on the top of the left foot that has been present for a couple of months and is not getting better and may be a little worse.  Yolanda Bonine tells me he communicates with his facial expressions.  When asked, he says yes to pain in his foot and sounds as if he is also having rest pain.    He does have a chronic indwelling foley present as well.    The pt is not on a statin for cholesterol management.    The pt is not on an aspirin.    Other AC:  Plavix The pt is on hydralazine for hypertension.  The pt does not have diabetes. Tobacco hx:  former    Past Medical History:  Diagnosis Date   BPH (benign prostatic hyperplasia)    CAD (coronary artery disease)    Chronic indwelling Foley catheter    CVA (cerebral vascular accident) ([place])    GERD (gastroesophageal reflux disease)    HTN (hypertension)    Neurogenic bladder    SSS (sick sinus syndrome) ([place])    s/p MDT ICD   Vitamin D deficiency     Past Surgical History:  Procedure Laterality Date   ABDOMINAL AORTOGRAM W/LOWER EXTREMITY N/A 03/23/2021   Procedure: ABDOMINAL AORTOGRAM W/LOWER EXTREMITY;  Surgeon: Waynetta Sandy, MD;   Location: [facility];  Service: Cardiovascular;  Laterality: N/A;   AMPUTATION Right 04/01/2021   Procedure: RIGHT GREAT TOE AMPUTATION;  Surgeon: Newt Minion, MD;  Location: [facility];  Service: Orthopedics;  Laterality: Right;   AMPUTATION Right 07/30/2021   Procedure: AMPUTATION ABOVE KNEE;  Surgeon: Broadus John, MD;  Location: [facility];  Service: Vascular;  Laterality: Right;   APPENDECTOMY     APPLICATION OF WOUND VAC Right 04/01/2021   Procedure: APPLICATION OF WOUND VAC;  Surgeon: Newt Minion, MD;  Location: [facility];  Service: Orthopedics;  Laterality: Right;   CARDIAC SURGERY     FEMORAL-TIBIAL BYPASS GRAFT Right 03/26/2021   Procedure: BYPASS GRAFT FEMORAL-TIBIAL ARTERY.  Harvest right leg saphenous vein, right vein patch angioplasty posterior tibial.;  Surgeon: Marty Heck, MD;  [facility];  Service: Vascular;  Laterality: Right;   IR US GUIDE BX ASP/DRAIN  08/03/2021   IR US GUIDE BX ASP/DRAIN  08/06/2021   PACEMAKER PLACEMENT      Allergies  Allergen Reactions   Food Anaphylaxis and Rash    Clams   Aspirin Itching   Crestor [Rosuvastatin Calcium] Other (See Comments)    Muscle pain   [place]  Clam (M. Mercenaria) Skin Test Itching   Penicillins Itching    Current Outpatient Medications  Medication Sig Dispense Refill   albuterol (VENTOLIN HFA) 108 (90 Base) MCG/ACT inhaler Inhale 2 puffs into the lungs every 6 (six) hours as needed for wheezing or shortness of breath. 20.1 g 0   ascorbic acid (VITAMIN C) 500 MG tablet Take 1 tablet (500 mg total) by mouth 2 (two) times daily. 180 tablet 0   cephALEXin (KEFLEX) 500 MG capsule Take 1 capsule (500 mg total) by mouth 4 (four) times daily. 28 capsule 0   clopidogrel (PLAVIX) 75 MG tablet Take 1 tablet (75 mg total) by mouth at bedtime. 90 tablet 0   divalproex (DEPAKOTE) 125 MG DR tablet Take 1 tablet (125 mg total) by mouth at bedtime. 90 tablet 0   feeding supplement (ENSURE ENLIVE / ENSURE PLUS)  LIQD Take 237 mLs by mouth 3 (three) times daily between meals. 237 mL 12   hydrALAZINE (APRESOLINE) 10 MG tablet Take 1 tablet (10 mg total) by mouth every 8 (eight) hours. 270 tablet 1   isosorbide mononitrate (IMDUR) 30 MG 24 hr tablet Take 0.5 tablets (15 mg total) by mouth daily. 45 tablet 1   Misc. Devices MISC Please provide patient with diapers, wipes, medium gloves and incontinence pads (chucks) z93.59 R41.89 I69.30    100 each PRN   nitroGLYCERIN (NITROSTAT) 0.4 MG SL tablet Place 0.4 mg under the tongue every 5 (five) minutes x 3 doses as needed for chest pain.     oxybutynin (DITROPAN-XL) 5 MG 24 hr tablet Take 1 tablet (5 mg total) by mouth daily. 90 tablet 0   oxyCODONE-acetaminophen (PERCOCET) 5-325 MG tablet Take 2 tablets by mouth every 4 (four) hours as needed. 10 tablet 0   pantoprazole (PROTONIX) 40 MG tablet TAKE 1 TABLET(40 MG) BY MOUTH DAILY 90 tablet 0   polyethylene glycol (MIRALAX / GLYCOLAX) 17 g packet Take 17 g by mouth daily as needed for mild constipation. 14 each 0   potassium chloride SA (KLOR-CON M) 20 MEQ tablet Take 1 tablet (20 mEq total) by mouth daily for 3 days. 3 tablet 0   QUEtiapine (SEROQUEL) 50 MG tablet Take 1 tablet (50 mg total) by mouth at bedtime. 90 tablet 1   senna-docusate (SENOKOT-S) 8.6-50 MG tablet Take 2 tablets by mouth 2 (two) times daily. 120 tablet 0   vitamin B-12 (CYANOCOBALAMIN) 1000 MCG tablet Take 1 tablet (1,000 mcg total) by mouth daily. 30 tablet 3   Vitamin D, Ergocalciferol, (DRISDOL) 1.25 MG (50000 UNIT) CAPS capsule Take 1 capsule (50,000 Units total) by mouth every Monday. 4 capsule 0   zinc sulfate 220 (50 Zn) MG capsule Take 1 capsule (220 mg total) by mouth daily. 90 capsule 1   No current facility-administered medications for this visit.    Family History  Family history unknown: Yes    Social History   Socioeconomic History   Marital status: Widowed    Spouse name: Not on file   Number of children: Not on  file   Years of education: Not on file   Highest education level: Not on file  Occupational History   Not on file  Tobacco Use   Smoking status: Former    Types: Cigarettes    Passive exposure: Never   Smokeless tobacco: Never  Substance and Sexual Activity   Alcohol use: Not Currently   Drug use: Never   Sexual activity: Not on file  Other Topics Concern  Not on file  Social History Narrative   ** Merged History Encounter **       Social Determinants of Health   Financial Resource Strain: Not on file  Food Insecurity: Not on file  Transportation Needs: Not on file  Physical Activity: Not on file  Stress: Not on file  Social Connections: Not on file  Intimate Partner Violence: Not on file     REVIEW OF SYSTEMS:   '[X]'$  denotes positive finding, '[ ]'$  denotes negative finding Cardiac  Comments:  Chest pain or chest pressure:    Shortness of breath upon exertion:    Short of breath when lying flat:    Irregular heart rhythm:        Vascular    Pain in calf, thigh, or hip brought on by ambulation:    Pain in feet at night that wakes you up from your sleep:  x   Blood clot in your veins:    Leg swelling:         Pulmonary    Oxygen at home:    Productive cough:     Wheezing:         Neurologic    Sudden weakness in arms or legs:     Sudden numbness in arms or legs:     Sudden onset of difficulty speaking or slurred speech:    Temporary loss of vision in one eye:     Problems with dizziness:         Gastrointestinal    Blood in stool:     Vomited blood:         Genitourinary    Burning when urinating:     Blood in urine:        Psychiatric    Major depression:         Hematologic    Bleeding problems:    Problems with blood clotting too easily:        Skin    Rashes or ulcers:        Constitutional    Fever or chills:      PHYSICAL EXAMINATION:  Today's Vitals   03/22/22 1418  BP: 117/60  Pulse: (!) 56  Resp: 16  Temp: 97.6 F (36.4 C)   TempSrc: Temporal  SpO2: 98%  Height: '6\' 1"'$  (1.854 m)  PainSc: 8    Body mass index is 14.51 kg/m.   General:  WDWN in NAD; vital signs documented above Gait: Not observed HENT: WNL, normocephalic Pulmonary: normal non-labored breathing , without wheezing Cardiac: regular HR Skin: without rashes Vascular Exam/Pulses: Monophasic left PT doppler signal present Bilateral radial pulses present Bilateral femoral pulses are easily palpable Extremities:  Right AKA has healed nicely.  Left foot   Musculoskeletal: no muscle wasting or atrophy  Neurologic: A&O    Non-Invasive Vascular Imaging:   ABI's/TBI's on 03/22/2022: Right:  amputation Left:  0.97/0.47 - Great toe pressure: 70   Previous ABI's/TBI's on 07/28/2021: Right:  0.22/amp - Great toe pressure: amp Left:  0.40/0.31 - Great toe pressure:  42   dampened monophasic     ASSESSMENT/PLAN:: 81 y.o. male here for follow up for PAD with hx of right leg bypass and subsequent right above knee amputation now with non healing wound dorsum of left foot.    -wound on dorsum of left foot has been present for a couple of months and is worsening.  Pt is having rest pain.  Will plan for angiogram to evaluate blood flow.  Discussed with pt and his grandson that if this wound gets worse or infected, he would be at risk for amputation.  I also discussed with them that even with arteriogram, he could still be at risk of amputation.   -plan for OR scheduler to call [name] to schedule with either Dr. Donzetta Matters or Dr. Carlis Abbott since they have done angiogram and bypass in the past.  -pt on plavix.  He has allergy to satin   Leontine Locket, [facility] [phone]  Clinic MD:   Virl Cagey on call MD

## 2022-03-22 NOTE — Progress Notes (Signed)
HISTORY AND PHYSICAL     CC:  follow up. Requesting Provider:  [name], NP  HPI: This is a 81 y.o. male who is here today for follow up for PAD.  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  This was for an occluded right common femoral to PT bypass with PTFE for CLI with toe gangrene performed on 03/26/2021 that ultimately occluded.    Pt was last seen 09/08/2021 by Dr. Carlis Abbott and at that time, he did not have any tissue loss on the left leg.  He was to f/u in 6 months with ABI for ongoing surveillance of the left leg.    The pt returns today for follow up.  He is here today with his grandson/caretaker.  His granddaughter [name] is also caretaker.  They tell me that he has a wound on the top of the left foot that has been present for a couple of months and is not getting better and may be a little worse.  Yolanda Bonine tells me he communicates with his facial expressions.  When asked, he says yes to pain in his foot and sounds as if he is also having rest pain.    He does have a chronic indwelling foley present as well.    The pt is not on a statin for cholesterol management.    The pt is not on an aspirin.    Other AC:  Plavix The pt is on hydralazine for hypertension.  The pt does not have diabetes. Tobacco hx:  former    Past Medical History:  Diagnosis Date   BPH (benign prostatic hyperplasia)    CAD (coronary artery disease)    Chronic indwelling Foley catheter    CVA (cerebral vascular accident) ([place])    GERD (gastroesophageal reflux disease)    HTN (hypertension)    Neurogenic bladder    SSS (sick sinus syndrome) ([place])    s/p MDT ICD   Vitamin D deficiency     Past Surgical History:  Procedure Laterality Date   ABDOMINAL AORTOGRAM W/LOWER EXTREMITY N/A 03/23/2021   Procedure: ABDOMINAL AORTOGRAM W/LOWER EXTREMITY;  Surgeon: Waynetta Sandy, MD;   Location: [facility];  Service: Cardiovascular;  Laterality: N/A;   AMPUTATION Right 04/01/2021   Procedure: RIGHT GREAT TOE AMPUTATION;  Surgeon: Newt Minion, MD;  Location: [facility];  Service: Orthopedics;  Laterality: Right;   AMPUTATION Right 07/30/2021   Procedure: AMPUTATION ABOVE KNEE;  Surgeon: Broadus John, MD;  Location: [facility];  Service: Vascular;  Laterality: Right;   APPENDECTOMY     APPLICATION OF WOUND VAC Right 04/01/2021   Procedure: APPLICATION OF WOUND VAC;  Surgeon: Newt Minion, MD;  Location: [facility];  Service: Orthopedics;  Laterality: Right;   CARDIAC SURGERY     FEMORAL-TIBIAL BYPASS GRAFT Right 03/26/2021   Procedure: BYPASS GRAFT FEMORAL-TIBIAL ARTERY.  Harvest right leg saphenous vein, right vein patch angioplasty posterior tibial.;  Surgeon: Marty Heck, MD;  [facility];  Service: Vascular;  Laterality: Right;   IR US GUIDE BX ASP/DRAIN  08/03/2021   IR US GUIDE BX ASP/DRAIN  08/06/2021   PACEMAKER PLACEMENT      Allergies  Allergen Reactions   Food Anaphylaxis and Rash    Clams   Aspirin Itching   Crestor [Rosuvastatin Calcium] Other (See Comments)    Muscle pain   [place]  Clam (M. Mercenaria) Skin Test Itching   Penicillins Itching    Current Outpatient Medications  Medication Sig Dispense Refill   albuterol (VENTOLIN HFA) 108 (90 Base) MCG/ACT inhaler Inhale 2 puffs into the lungs every 6 (six) hours as needed for wheezing or shortness of breath. 20.1 g 0   ascorbic acid (VITAMIN C) 500 MG tablet Take 1 tablet (500 mg total) by mouth 2 (two) times daily. 180 tablet 0   cephALEXin (KEFLEX) 500 MG capsule Take 1 capsule (500 mg total) by mouth 4 (four) times daily. 28 capsule 0   clopidogrel (PLAVIX) 75 MG tablet Take 1 tablet (75 mg total) by mouth at bedtime. 90 tablet 0   divalproex (DEPAKOTE) 125 MG DR tablet Take 1 tablet (125 mg total) by mouth at bedtime. 90 tablet 0   feeding supplement (ENSURE ENLIVE / ENSURE PLUS)  LIQD Take 237 mLs by mouth 3 (three) times daily between meals. 237 mL 12   hydrALAZINE (APRESOLINE) 10 MG tablet Take 1 tablet (10 mg total) by mouth every 8 (eight) hours. 270 tablet 1   isosorbide mononitrate (IMDUR) 30 MG 24 hr tablet Take 0.5 tablets (15 mg total) by mouth daily. 45 tablet 1   Misc. Devices MISC Please provide patient with diapers, wipes, medium gloves and incontinence pads (chucks) z93.59 R41.89 I69.30    100 each PRN   nitroGLYCERIN (NITROSTAT) 0.4 MG SL tablet Place 0.4 mg under the tongue every 5 (five) minutes x 3 doses as needed for chest pain.     oxybutynin (DITROPAN-XL) 5 MG 24 hr tablet Take 1 tablet (5 mg total) by mouth daily. 90 tablet 0   oxyCODONE-acetaminophen (PERCOCET) 5-325 MG tablet Take 2 tablets by mouth every 4 (four) hours as needed. 10 tablet 0   pantoprazole (PROTONIX) 40 MG tablet TAKE 1 TABLET(40 MG) BY MOUTH DAILY 90 tablet 0   polyethylene glycol (MIRALAX / GLYCOLAX) 17 g packet Take 17 g by mouth daily as needed for mild constipation. 14 each 0   potassium chloride SA (KLOR-CON M) 20 MEQ tablet Take 1 tablet (20 mEq total) by mouth daily for 3 days. 3 tablet 0   QUEtiapine (SEROQUEL) 50 MG tablet Take 1 tablet (50 mg total) by mouth at bedtime. 90 tablet 1   senna-docusate (SENOKOT-S) 8.6-50 MG tablet Take 2 tablets by mouth 2 (two) times daily. 120 tablet 0   vitamin B-12 (CYANOCOBALAMIN) 1000 MCG tablet Take 1 tablet (1,000 mcg total) by mouth daily. 30 tablet 3   Vitamin D, Ergocalciferol, (DRISDOL) 1.25 MG (50000 UNIT) CAPS capsule Take 1 capsule (50,000 Units total) by mouth every Monday. 4 capsule 0   zinc sulfate 220 (50 Zn) MG capsule Take 1 capsule (220 mg total) by mouth daily. 90 capsule 1   No current facility-administered medications for this visit.    Family History  Family history unknown: Yes    Social History   Socioeconomic History   Marital status: Widowed    Spouse name: Not on file   Number of children: Not on  file   Years of education: Not on file   Highest education level: Not on file  Occupational History   Not on file  Tobacco Use   Smoking status: Former    Types: Cigarettes    Passive exposure: Never   Smokeless tobacco: Never  Substance and Sexual Activity   Alcohol use: Not Currently   Drug use: Never   Sexual activity: Not on file  Other Topics Concern  Not on file  Social History Narrative   ** Merged History Encounter **       Social Determinants of Health   Financial Resource Strain: Not on file  Food Insecurity: Not on file  Transportation Needs: Not on file  Physical Activity: Not on file  Stress: Not on file  Social Connections: Not on file  Intimate Partner Violence: Not on file     REVIEW OF SYSTEMS:   '[X]'$  denotes positive finding, '[ ]'$  denotes negative finding Cardiac  Comments:  Chest pain or chest pressure:    Shortness of breath upon exertion:    Short of breath when lying flat:    Irregular heart rhythm:        Vascular    Pain in calf, thigh, or hip brought on by ambulation:    Pain in feet at night that wakes you up from your sleep:  x   Blood clot in your veins:    Leg swelling:         Pulmonary    Oxygen at home:    Productive cough:     Wheezing:         Neurologic    Sudden weakness in arms or legs:     Sudden numbness in arms or legs:     Sudden onset of difficulty speaking or slurred speech:    Temporary loss of vision in one eye:     Problems with dizziness:         Gastrointestinal    Blood in stool:     Vomited blood:         Genitourinary    Burning when urinating:     Blood in urine:        Psychiatric    Major depression:         Hematologic    Bleeding problems:    Problems with blood clotting too easily:        Skin    Rashes or ulcers:        Constitutional    Fever or chills:      PHYSICAL EXAMINATION:  Today's Vitals   03/22/22 1418  BP: 117/60  Pulse: (!) 56  Resp: 16  Temp: 97.6 F (36.4 C)   TempSrc: Temporal  SpO2: 98%  Height: '6\' 1"'$  (1.854 m)  PainSc: 8    Body mass index is 14.51 kg/m.   General:  WDWN in NAD; vital signs documented above Gait: Not observed HENT: WNL, normocephalic Pulmonary: normal non-labored breathing , without wheezing Cardiac: regular HR Skin: without rashes Vascular Exam/Pulses: Monophasic left PT doppler signal present Bilateral radial pulses present Bilateral femoral pulses are easily palpable Extremities:  Right AKA has healed nicely.  Left foot   Musculoskeletal: no muscle wasting or atrophy  Neurologic: A&O    Non-Invasive Vascular Imaging:   ABI's/TBI's on 03/22/2022: Right:  amputation Left:  0.97/0.47 - Great toe pressure: 70   Previous ABI's/TBI's on 07/28/2021: Right:  0.22/amp - Great toe pressure: amp Left:  0.40/0.31 - Great toe pressure:  42   dampened monophasic     ASSESSMENT/PLAN:: 81 y.o. male here for follow up for PAD with hx of right leg bypass and subsequent right above knee amputation now with non healing wound dorsum of left foot.    -wound on dorsum of left foot has been present for a couple of months and is worsening.  Pt is having rest pain.  Will plan for angiogram to evaluate blood flow.  Discussed with pt and his grandson that if this wound gets worse or infected, he would be at risk for amputation.  I also discussed with them that even with arteriogram, he could still be at risk of amputation.   -plan for OR scheduler to call [name] to schedule with either Dr. Donzetta Matters or Dr. Carlis Abbott since they have done angiogram and bypass in the past.  -pt on plavix.  He has allergy to satin   Leontine Locket, [facility] [phone]  Clinic MD:   Virl Cagey on call MD

## 2022-03-24 ENCOUNTER — Other Ambulatory Visit: Payer: Self-pay

## 2022-03-24 DIAGNOSIS — I70222 Atherosclerosis of native arteries of extremities with rest pain, left leg: Secondary | ICD-10-CM

## 2022-03-29 ENCOUNTER — Ambulatory Visit (HOSPITAL_COMMUNITY)
Admission: RE | Admit: 2022-03-29 | Discharge: 2022-03-29 | Disposition: A | Discharge status: Home / Self Care | Payer: Medicare Other | Attending: Vascular Surgery | Admitting: Vascular Surgery

## 2022-03-29 ENCOUNTER — Ambulatory Visit (HOSPITAL_COMMUNITY)
Admission: RE | Disposition: A | Discharge status: Home / Self Care | Payer: Self-pay | Source: Home / Self Care | Attending: Vascular Surgery

## 2022-03-29 ENCOUNTER — Other Ambulatory Visit: Payer: Self-pay

## 2022-03-29 DIAGNOSIS — Z8673 Personal history of transient ischemic attack (TIA), and cerebral infarction without residual deficits: Secondary | ICD-10-CM | POA: Diagnosis not present

## 2022-03-29 DIAGNOSIS — I70244 Atherosclerosis of native arteries of left leg with ulceration of heel and midfoot: Secondary | ICD-10-CM | POA: Diagnosis not present

## 2022-03-29 DIAGNOSIS — Z79899 Other long term (current) drug therapy: Secondary | ICD-10-CM | POA: Insufficient documentation

## 2022-03-29 DIAGNOSIS — L97529 Non-pressure chronic ulcer of other part of left foot with unspecified severity: Secondary | ICD-10-CM | POA: Diagnosis not present

## 2022-03-29 DIAGNOSIS — Z89611 Acquired absence of right leg above knee: Secondary | ICD-10-CM | POA: Diagnosis not present

## 2022-03-29 DIAGNOSIS — I70245 Atherosclerosis of native arteries of left leg with ulceration of other part of foot: Secondary | ICD-10-CM | POA: Diagnosis present

## 2022-03-29 DIAGNOSIS — I10 Essential (primary) hypertension: Secondary | ICD-10-CM | POA: Diagnosis not present

## 2022-03-29 DIAGNOSIS — I70222 Atherosclerosis of native arteries of extremities with rest pain, left leg: Secondary | ICD-10-CM

## 2022-03-29 DIAGNOSIS — Z87891 Personal history of nicotine dependence: Secondary | ICD-10-CM | POA: Insufficient documentation

## 2022-03-29 HISTORY — PX: ABDOMINAL AORTOGRAM W/LOWER EXTREMITY: CATH118223

## 2022-03-29 LAB — POCT I-STAT, CHEM 8
BUN: 26 mg/dL — ABNORMAL HIGH (ref 8–23)
Calcium, Ion: 1.23 mmol/L (ref 1.15–1.40)
Chloride: 111 mmol/L (ref 98–111)
Creatinine, Ser: 0.8 mg/dL (ref 0.61–1.24)
Glucose, Bld: 83 mg/dL (ref 70–99)
HCT: 44 % (ref 39.0–52.0)
Hemoglobin: 15 g/dL (ref 13.0–17.0)
Potassium: 3.7 mmol/L (ref 3.5–5.1)
Sodium: 145 mmol/L (ref 135–145)
TCO2: 26 mmol/L (ref 22–32)

## 2022-03-29 SURGERY — ABDOMINAL AORTOGRAM W/LOWER EXTREMITY
Anesthesia: LOCAL

## 2022-03-29 MED ORDER — LIDOCAINE HCL (PF) 1 % IJ SOLN
INTRAMUSCULAR | Status: DC | PRN
  Administered 2022-03-29: 10 mL via SUBCUTANEOUS

## 2022-03-29 MED ORDER — HEPARIN (PORCINE) IN NACL 1000-0.9 UT/500ML-% IV SOLN
INTRAVENOUS | Status: AC
  Filled 2022-03-29: qty 1000

## 2022-03-29 MED ORDER — HEPARIN SODIUM (PORCINE) 1000 UNIT/ML IJ SOLN
INTRAMUSCULAR | Status: DC | PRN
  Administered 2022-03-29: 5000 [IU] via INTRAVENOUS

## 2022-03-29 MED ORDER — HEPARIN SODIUM (PORCINE) 1000 UNIT/ML IJ SOLN
INTRAMUSCULAR | Status: AC
  Filled 2022-03-29: qty 10

## 2022-03-29 MED ORDER — LABETALOL HCL 5 MG/ML IV SOLN: 10.0000 mg | INTRAVENOUS | Status: DC | PRN

## 2022-03-29 MED ORDER — SODIUM CHLORIDE 0.9 % WEIGHT BASED INFUSION: 1.0000 mL/kg/h | INTRAVENOUS | Status: DC

## 2022-03-29 MED ORDER — SODIUM CHLORIDE 0.9% FLUSH: 3.0000 mL | INTRAVENOUS | Status: DC | PRN

## 2022-03-29 MED ORDER — ACETAMINOPHEN 325 MG PO TABS: 650.0000 mg | ORAL_TABLET | ORAL | Status: DC | PRN

## 2022-03-29 MED ORDER — IODIXANOL 320 MG/ML IV SOLN
INTRAVENOUS | Status: DC | PRN
  Administered 2022-03-29: 90 mL via INTRA_ARTERIAL

## 2022-03-29 MED ORDER — OXYCODONE HCL 5 MG PO TABS: 5.0000 mg | ORAL_TABLET | ORAL | Status: DC | PRN

## 2022-03-29 MED ORDER — SODIUM CHLORIDE 0.9 % IV SOLN: INTRAVENOUS | Status: DC

## 2022-03-29 MED ORDER — LIDOCAINE HCL (PF) 1 % IJ SOLN
INTRAMUSCULAR | Status: AC
  Filled 2022-03-29: qty 30

## 2022-03-29 MED ORDER — SODIUM CHLORIDE 0.9 % IV SOLN: 250.0000 mL | INTRAVENOUS | Status: DC | PRN

## 2022-03-29 MED ORDER — HYDRALAZINE HCL 20 MG/ML IJ SOLN: 5.0000 mg | INTRAMUSCULAR | Status: DC | PRN

## 2022-03-29 MED ORDER — ONDANSETRON HCL 4 MG/2ML IJ SOLN: 4.0000 mg | Freq: Four times a day (QID) | INTRAMUSCULAR | Status: DC | PRN

## 2022-03-29 MED ORDER — SODIUM CHLORIDE 0.9% FLUSH: 3.0000 mL | Freq: Two times a day (BID) | INTRAVENOUS | Status: DC

## 2022-03-29 SURGICAL SUPPLY — 19 items
BALLN STERLING OTW 3X100X150 (BALLOONS) ×1
BALLOON STERLING OTW 3X100X150 (BALLOONS) IMPLANT
CATH CXI SUPP 2.6F 150 ANG (CATHETERS) IMPLANT
CATH OMNI FLUSH 5F 65CM (CATHETERS) IMPLANT
CLOSURE MYNX CONTROL 6F/7F (Vascular Products) IMPLANT
KIT ENCORE 26 ADVANTAGE (KITS) IMPLANT
KIT MICROPUNCTURE NIT STIFF (SHEATH) IMPLANT
KIT PV (KITS) ×1 IMPLANT
PATCH THROMBIX TOPICAL PLAIN (HEMOSTASIS) IMPLANT
SHEATH CATAPULT 6FR 60 (SHEATH) IMPLANT
SHEATH PINNACLE 5F 10CM (SHEATH) IMPLANT
SHEATH PINNACLE 6F 10CM (SHEATH) IMPLANT
SHEATH PROBE COVER 6X72 (BAG) IMPLANT
SYR MEDRAD MARK V 150ML (SYRINGE) IMPLANT
TRANSDUCER W/STOPCOCK (MISCELLANEOUS) ×1 IMPLANT
TRAY PV CATH (CUSTOM PROCEDURE TRAY) ×1 IMPLANT
WIRE BENTSON .035X145CM (WIRE) IMPLANT
WIRE G V18X300CM (WIRE) IMPLANT
WIRE ROSEN-J .035X180CM (WIRE) IMPLANT

## 2022-03-30 ENCOUNTER — Encounter (HOSPITAL_COMMUNITY): Payer: Self-pay | Admitting: Vascular Surgery

## 2022-03-30 ENCOUNTER — Telehealth: Payer: Self-pay

## 2022-03-30 MED FILL — Heparin Sod (Porcine)-NaCl IV Soln 1000 Unit/500ML-0.9%: INTRAVENOUS | Qty: 1000 | Status: AC

## 2022-03-30 NOTE — Telephone Encounter (Signed)
[employer] Alert Received. CareAlert for LIA triggered on 2022/04/19 @ 2041 after 2 or more HR-NS episodes and SIC counter >30 in days logged. Presenting rhythm VF (wide/polymorphic) with intermittent drop out due to morphology, episode start 2040. Clear disassociation noted.  Per EPIC, patient had procedure earlier in  the day (AM) due to critical ischemia LLE.  Routing high alert.  Attempted to contact patient. Granddaughter answered the phone to advise patient passed away 2022/04/19. Offered condolences to family. Advised I will forward to Dr. Caryl Comes and his nurse to update.

## 2022-04-06 ENCOUNTER — Telehealth: Payer: Self-pay | Admitting: Surgery

## 2022-04-06 NOTE — Telephone Encounter (Signed)
-----   Message from Serafina Mitchell, MD sent at 04/06/2022  7:40 AM EDT ----- Can you attach his chart to this encounter, inteasd of Mr Bogacz ----- Message ----- From: Garrel Ridgel Sent: 04/02/2022  10:42 AM EDT To: Serafina Mitchell, MD  Good Morning  Receive a call from [facility]. Apologizes for missing call. He stated that everything is healing and looking good.  Thank You, Verdis Frederickson

## 2022-04-07 NOTE — Progress Notes (Signed)
Unable to obtain o2sat upon transfer to room, Charge Nurse and transferring nurse in room  and aware. Will continue to try and obtain.

## 2022-04-07 NOTE — Op Note (Signed)
    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 Donzetta Matters, MD Procedure Performed: 1.  Ultrasound-guided cannulation right common femoral artery 2.  Aortogram with selection of left common femoral artery and left lower extremity angiogram 3.  Balloon angioplasty of left TP trunk and posterior tibial artery with 3 mm balloon 4.  Mynx device closure right common femoral artery  Indications: 81 year old male with right lower extremity amputation now with wound on the dorsum of the left foot.  He does have a toe pressure of 70 ABI is monophasic and calcified.  He is indicated for angiography with possible intervention.  Findings: All the holes were heavily calcified.  Aorta and iliac segments are free of flow-limiting stenosis as of the left common femoral artery.  Left SFA with 2 areas of approximately 50% nonflow limiting stenosis.  TP trunk is diseased peroneal and anterior tibial arteries are occluded.  Posterior tibial artery is occluded for approximately 8 cm and after balloon angioplasty there is no residual stenosis or dissection.  TP trunk did have approximately 60% stenosis and after balloon angioplasty this is resolved to less than 20% which is nonflow limiting and there is no dissection.  At completion there was a palpable posterior tibial pulse on the left.   Procedure:  The patient was identified in the holding area and taken to room 8.  The patient was then placed supine on the table and prepped and draped in the usual sterile fashion.  A time out was called.  Ultrasound was used to evaluate the right common femoral artery which was heavily calcified there was scar tissue from previous bypass.  The area was anesthetized 1% lidocaine cannulated micropuncture needle followed by wire and sheath.  An image was saved to the  permanent record.  We placed a Bentson wire followed by a 5 French sheath and Omni catheter to the level of L1 performed aortogram and cross bifurcation perform left lower extremity angiogram.  With the above findings we placed a Rosen wire into the SFA and along 6 [place] sheath.  Patient was heparinized with 5000 units of heparin.  We then used a V18 wire and crossed distally all the way down the PT artery.  We then primarily balloon the distal PT where it was occluded and this resolved to 0% residual stenosis and then we ballooned the TP trunk which resolved to less than 20% stenosis.  Satisfied with this we remove the balloon and exchanged for a Bentson wire and then a short 6 French sheath and minx device was deployed.  Patient tolerated procedure without any complication.  Contrast: 90cc  Ivelisse Culverhouse C. Donzetta Matters, MD [facility] [place] Office: [phone] Pager: [phone]

## 2022-04-07 NOTE — Progress Notes (Signed)
Patient dressed and ambulated to [facility] for D/C. Patient became unresponsive. Patient transported back to bed. R groin site oozing. Pressure held for 15 minutes. No hematoma present. B/P is 104/87. Patient responsive once ambulated back to bed. Donzetta Matters, MD at bedside to see patient. MD stated to try to ambulate patient again in one hour, which would be at 1445. Will continue to monitor.

## 2022-04-07 NOTE — Interval H&P Note (Signed)
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  The various methods of treatment have been discussed with the patient and family. After consideration of risks, benefits and other options for treatment, the patient has consented to  Procedure(s): ABDOMINAL AORTOGRAM W/LOWER EXTREMITY (N/A) as a surgical intervention.  The patient's history has been reviewed, patient examined, no change in status, stable for surgery.  I have reviewed the patient's chart and labs.  Questions were answered to the patient's satisfaction.     [name]

## 2022-04-07 NOTE — Progress Notes (Signed)
Pt began bleeding, pressure being applied by nursing team. Pt placed back in bed and began vomiting. Dr. Donzetta Matters notified and states he will be by to see pt. Will continue to monitor.

## 2022-04-07 NOTE — Progress Notes (Signed)
Late Entry: Prior to the 2nd attempt for D/C, the patient was alert and responsive. Vital signs remained stable prior to discharge. No N/V noted, was able to tolerated fluids. Per MD's verbal order, after one hour the patient was reassessed and ambulated with assistance back to chair. The patient tolerated well. No s/s of hematoma or bleeding noted to R Femoral site, dressing remained intact. PIV was removed, no s/s of infiltration or infection noted at site.

## 2022-04-07 DEATH — deceased

## 2022-04-23 ENCOUNTER — Encounter: Payer: Self-pay | Admitting: Physician Assistant

## 2022-04-27 ENCOUNTER — Encounter: Payer: Self-pay | Admitting: Physician Assistant

## 2022-05-04 ENCOUNTER — Ambulatory Visit: Payer: Medicare Other | Admitting: Nurse Practitioner

## 2022-06-04 IMAGING — CT CT ANGIO CHEST
2 of 6 series · 18 of 36 positions shown · IV contrast (agent unspecified)
Comparison: None Available.

CLINICAL DATA: Pulmonary embolism (PE) suspected, high prob.
Hypoxia and dyspnea. COPD, CHF, asthma.

EXAM:
CT ANGIOGRAPHY CHEST WITH CONTRAST
TECHNIQUE: Multidetector CT imaging of the chest was performed using the
standard protocol during bolus administration of intravenous
contrast. Multiplanar CT image reconstructions and MIPs were
obtained to evaluate the vascular anatomy.

[Series 5: thins · axial · 0.70mm/px · z∈[-192,+77]mm · 17 of 303 slices shown]
[im 17/303  lung]
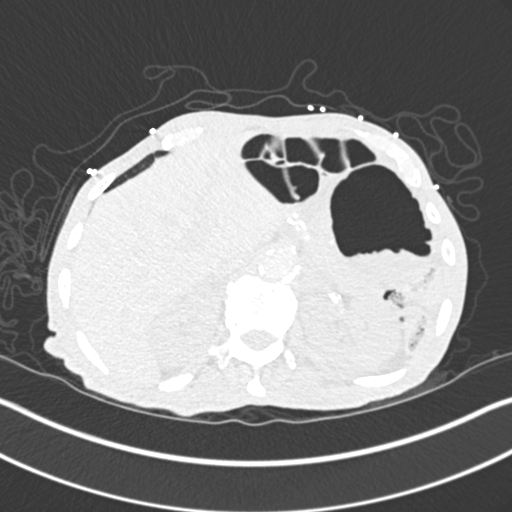
[im 34/303  mediastinal]
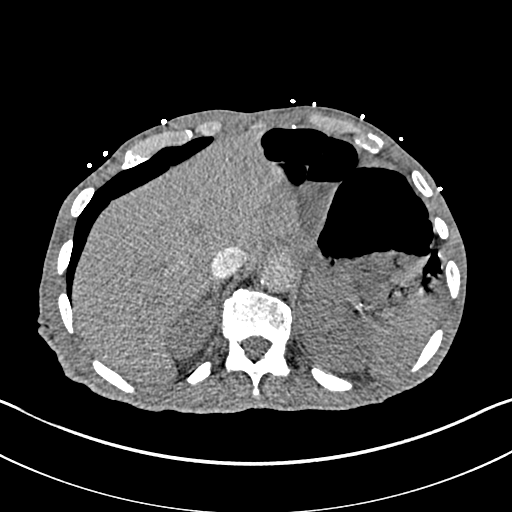
[im 51/303  lung]
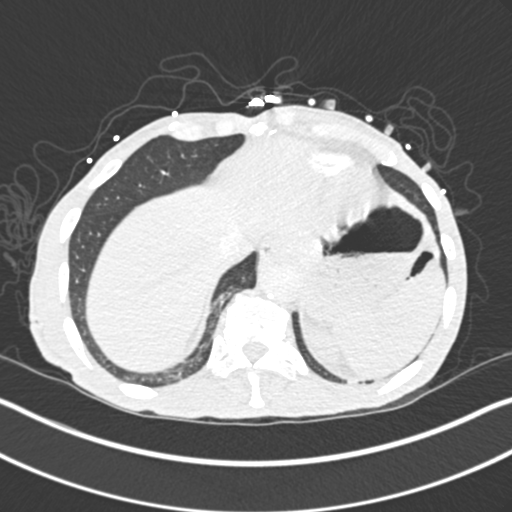
[im 68/303  mediastinal]
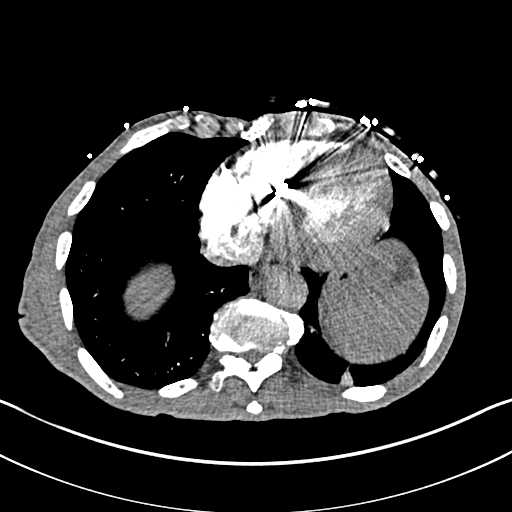
[im 84/303  lung]
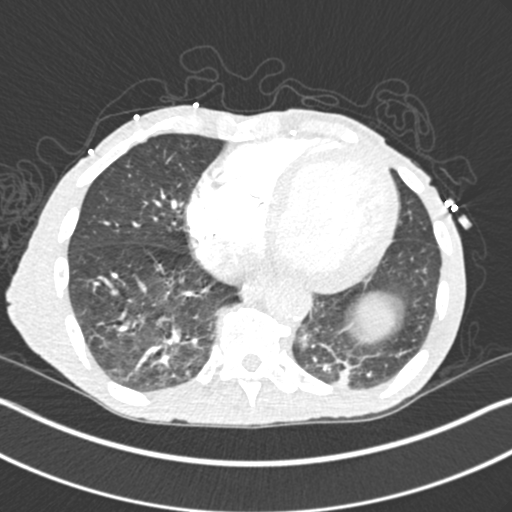
[im 101/303  mediastinal]
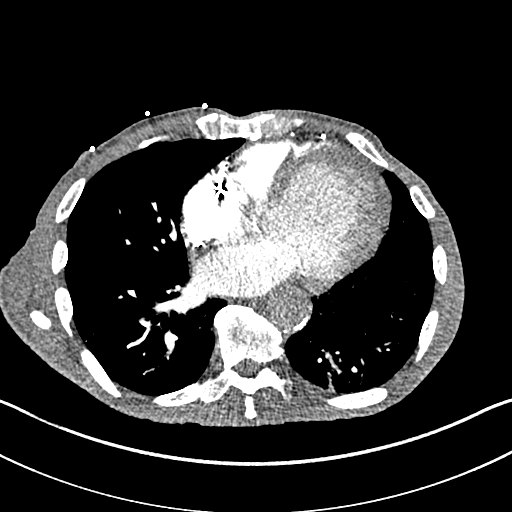
[im 118/303  lung]
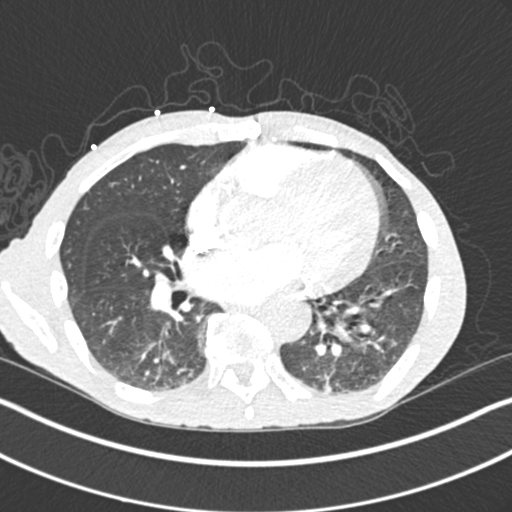
[im 135/303  mediastinal]
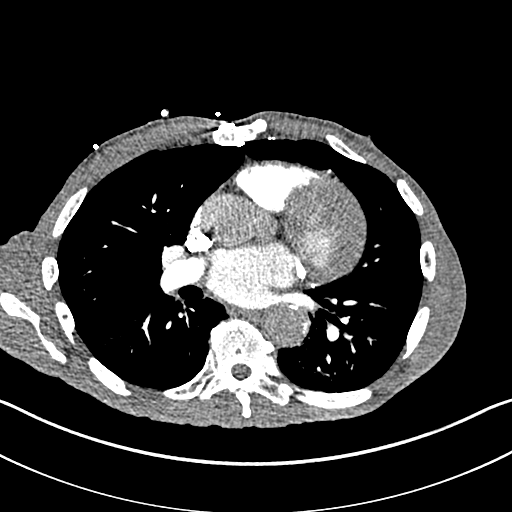
[im 152/303  lung]
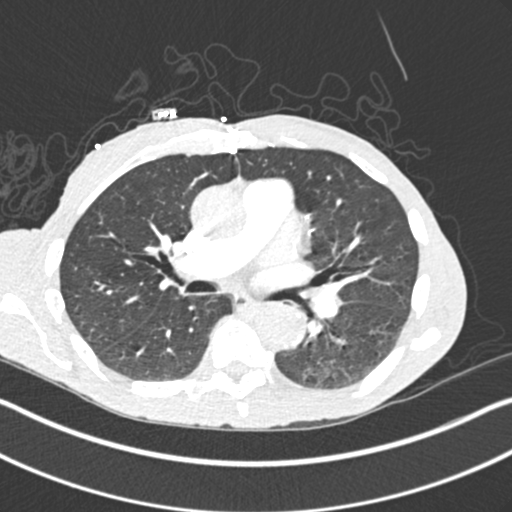
[im 168/303  mediastinal]
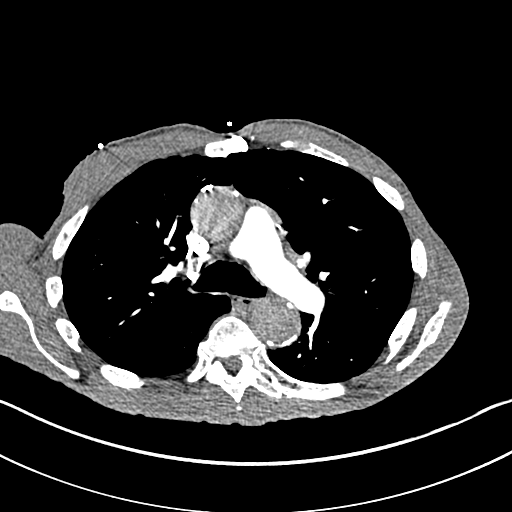
[im 185/303  lung]
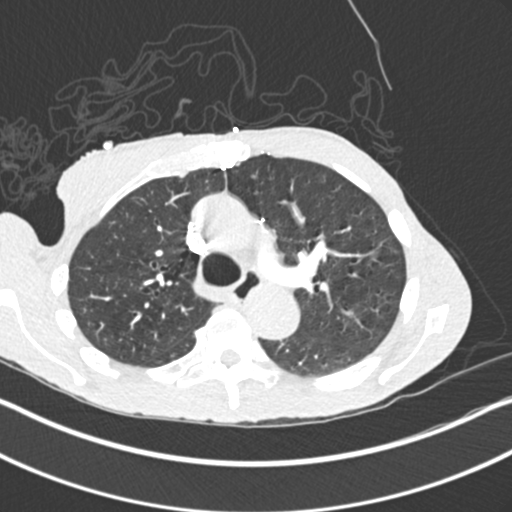
[im 202/303  mediastinal]
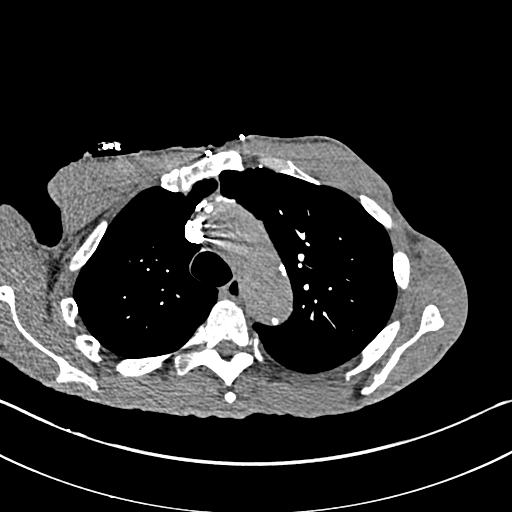
[im 219/303  lung]
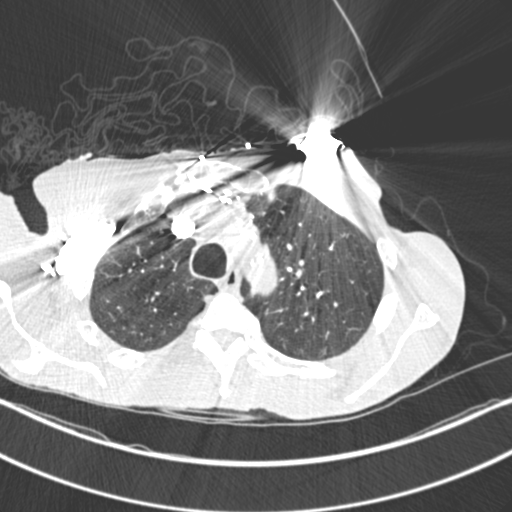
[im 235/303  mediastinal]
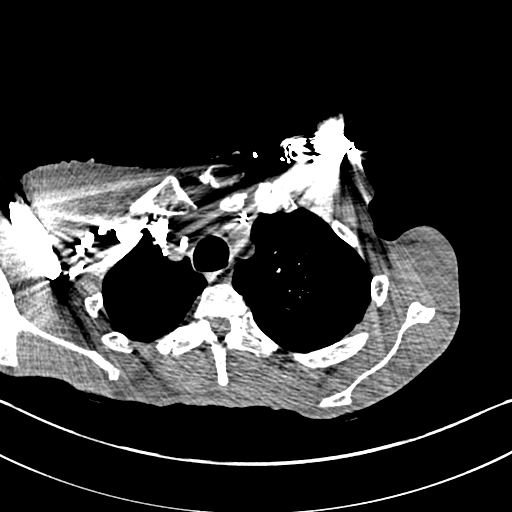
[im 252/303  lung]
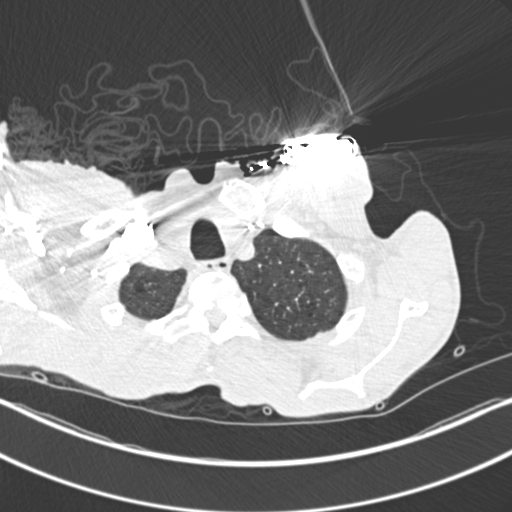
[im 269/303  mediastinal]
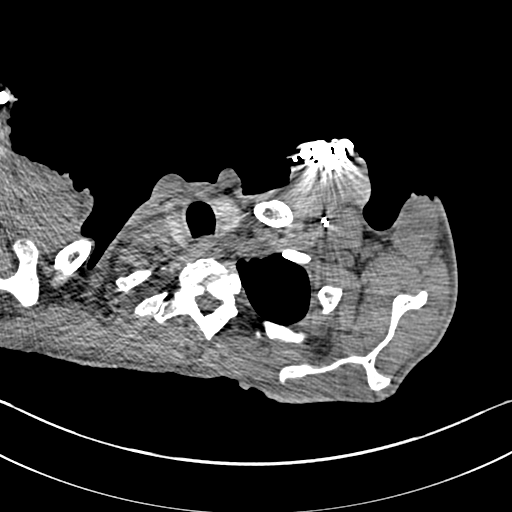
[im 286/303  lung]
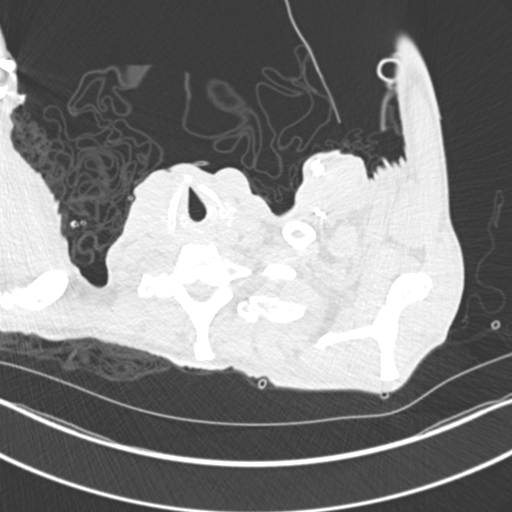

[Series 6: coronal mpr · coronal · 0.68mm/px · 1 of 111 slices shown]
[im 56/111  mediastinal]
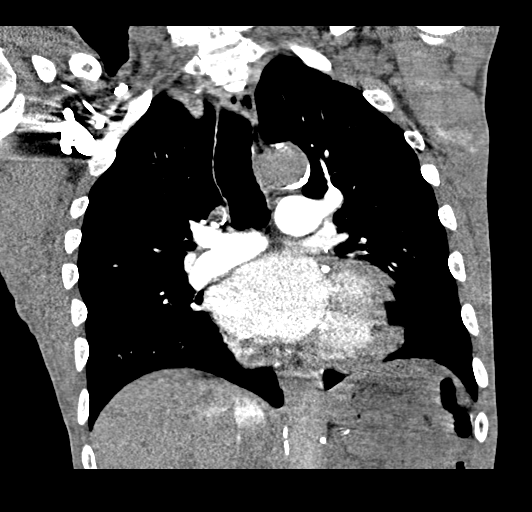

[18 of 36 positions shown; findings below may reference images not displayed]

RADIATION DOSE REDUCTION: This exam was performed according to the
departmental dose-optimization program which includes automated
exposure control, adjustment of the mA and/or kV according to
patient size and/or use of iterative reconstruction technique.

CONTRAST:  80mL OMNIPAQUE IOHEXOL 350 MG/ML SOLN
FINDINGS: Cardiovascular: Adequate opacification the pulmonary arterial tree.
No intraluminal filling defect identified to suggest acute pulmonary
embolism. Central pulmonary arteries are of normal caliber.

Coronary artery bypass grafting has been performed. Mild
cardiomegaly with left ventricular dilation. No pericardial
effusion. Left subclavian dual lead pacemaker is in place with leads
within the right atrium and right ventricle. Residual epicardial
pacer leads noted. Mild atherosclerotic calcification within the
thoracic aorta.

Mediastinum/Nodes: No enlarged mediastinal, hilar, or axillary lymph
nodes. Thyroid gland, trachea, and esophagus demonstrate no
significant findings.

Lungs/Pleura: Mild bibasilar atelectasis. Lungs are otherwise clear.
No pneumothorax or pleural effusion. Central airways are widely
patent.

Upper Abdomen: No acute abnormality.

Musculoskeletal: No acute bone abnormality. No lytic or blastic bone
lesion.

Review of the MIP images confirms the above findings.
IMPRESSION: No pulmonary embolism.

Status post coronary artery bypass grafting. Mild cardiomegaly with
left ventricular dilation.

Aortic Atherosclerosis ([id number]).
